# Patient Record
Sex: Female | Born: 1940
Health system: Southern US, Community
[De-identification: ages and names within clinical notes are randomized; demographics above are authoritative.]

## PROBLEM LIST (undated history)

## (undated) DIAGNOSIS — Z9889 Other specified postprocedural states: Secondary | ICD-10-CM

## (undated) DIAGNOSIS — R350 Frequency of micturition: Secondary | ICD-10-CM

## (undated) DIAGNOSIS — Z8709 Personal history of other diseases of the respiratory system: Secondary | ICD-10-CM

## (undated) DIAGNOSIS — M549 Dorsalgia, unspecified: Secondary | ICD-10-CM

## (undated) DIAGNOSIS — R3915 Urgency of urination: Secondary | ICD-10-CM

## (undated) DIAGNOSIS — M1990 Unspecified osteoarthritis, unspecified site: Secondary | ICD-10-CM

## (undated) DIAGNOSIS — R200 Anesthesia of skin: Secondary | ICD-10-CM

## (undated) DIAGNOSIS — K649 Unspecified hemorrhoids: Secondary | ICD-10-CM

## (undated) DIAGNOSIS — G43909 Migraine, unspecified, not intractable, without status migrainosus: Secondary | ICD-10-CM

## (undated) DIAGNOSIS — J449 Chronic obstructive pulmonary disease, unspecified: Secondary | ICD-10-CM

## (undated) DIAGNOSIS — K219 Gastro-esophageal reflux disease without esophagitis: Secondary | ICD-10-CM

## (undated) DIAGNOSIS — G4700 Insomnia, unspecified: Secondary | ICD-10-CM

## (undated) DIAGNOSIS — F329 Major depressive disorder, single episode, unspecified: Secondary | ICD-10-CM

## (undated) DIAGNOSIS — R351 Nocturia: Secondary | ICD-10-CM

## (undated) DIAGNOSIS — R112 Nausea with vomiting, unspecified: Secondary | ICD-10-CM

## (undated) DIAGNOSIS — K5900 Constipation, unspecified: Secondary | ICD-10-CM

## (undated) DIAGNOSIS — M810 Age-related osteoporosis without current pathological fracture: Secondary | ICD-10-CM

## (undated) DIAGNOSIS — K5790 Diverticulosis of intestine, part unspecified, without perforation or abscess without bleeding: Secondary | ICD-10-CM

## (undated) DIAGNOSIS — Z8619 Personal history of other infectious and parasitic diseases: Secondary | ICD-10-CM

## (undated) DIAGNOSIS — Z8669 Personal history of other diseases of the nervous system and sense organs: Secondary | ICD-10-CM

## (undated) DIAGNOSIS — K529 Noninfective gastroenteritis and colitis, unspecified: Secondary | ICD-10-CM

## (undated) DIAGNOSIS — I4891 Unspecified atrial fibrillation: Secondary | ICD-10-CM

## (undated) DIAGNOSIS — H269 Unspecified cataract: Secondary | ICD-10-CM

## (undated) DIAGNOSIS — G8929 Other chronic pain: Secondary | ICD-10-CM

## (undated) DIAGNOSIS — E785 Hyperlipidemia, unspecified: Secondary | ICD-10-CM

## (undated) DIAGNOSIS — F419 Anxiety disorder, unspecified: Secondary | ICD-10-CM

## (undated) DIAGNOSIS — F32A Depression, unspecified: Secondary | ICD-10-CM

## (undated) HISTORY — PX: ABDOMINAL HYSTERECTOMY: SHX81

## (undated) HISTORY — DX: Noninfective gastroenteritis and colitis, unspecified: K52.9

## (undated) HISTORY — DX: Chronic obstructive pulmonary disease, unspecified: J44.9

## (undated) HISTORY — DX: Migraine, unspecified, not intractable, without status migrainosus: G43.909

## (undated) HISTORY — PX: COLONOSCOPY: SHX174

## (undated) HISTORY — DX: Unspecified cataract: H26.9

## (undated) HISTORY — PX: SPINE SURGERY: SHX786

## (undated) HISTORY — DX: Hyperlipidemia, unspecified: E78.5

## (undated) HISTORY — PX: OTHER SURGICAL HISTORY: SHX169

## (undated) HISTORY — DX: Gastro-esophageal reflux disease without esophagitis: K21.9

## (undated) HISTORY — DX: Age-related osteoporosis without current pathological fracture: M81.0

## (undated) HISTORY — PX: ESOPHAGOGASTRODUODENOSCOPY: SHX1529

---
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
## 2000-08-18 ENCOUNTER — Other Ambulatory Visit: Admission: RE | Admit: 2000-08-18 | Discharge: 2000-08-18 | Payer: Self-pay | Admitting: Obstetrics and Gynecology

## 2001-09-07 ENCOUNTER — Encounter (INDEPENDENT_AMBULATORY_CARE_PROVIDER_SITE_OTHER): Payer: Self-pay | Admitting: Specialist

## 2001-09-07 ENCOUNTER — Other Ambulatory Visit: Admission: RE | Admit: 2001-09-07 | Discharge: 2001-09-07 | Payer: Self-pay | Admitting: Gastroenterology

## 2003-06-27 ENCOUNTER — Other Ambulatory Visit: Admission: RE | Admit: 2003-06-27 | Discharge: 2003-06-27 | Payer: Self-pay | Admitting: Family Medicine

## 2005-05-24 ENCOUNTER — Other Ambulatory Visit: Admission: RE | Admit: 2005-05-24 | Discharge: 2005-05-24 | Payer: Self-pay | Admitting: Family Medicine

## 2012-03-26 DIAGNOSIS — M25519 Pain in unspecified shoulder: Secondary | ICD-10-CM | POA: Diagnosis not present

## 2012-04-01 DIAGNOSIS — M25519 Pain in unspecified shoulder: Secondary | ICD-10-CM | POA: Diagnosis not present

## 2012-04-01 DIAGNOSIS — M719 Bursopathy, unspecified: Secondary | ICD-10-CM | POA: Diagnosis not present

## 2012-04-01 DIAGNOSIS — M67919 Unspecified disorder of synovium and tendon, unspecified shoulder: Secondary | ICD-10-CM | POA: Diagnosis not present

## 2012-04-01 DIAGNOSIS — M19019 Primary osteoarthritis, unspecified shoulder: Secondary | ICD-10-CM | POA: Diagnosis not present

## 2012-04-01 DIAGNOSIS — M75100 Unspecified rotator cuff tear or rupture of unspecified shoulder, not specified as traumatic: Secondary | ICD-10-CM | POA: Diagnosis not present

## 2012-04-17 DIAGNOSIS — R7989 Other specified abnormal findings of blood chemistry: Secondary | ICD-10-CM | POA: Diagnosis not present

## 2012-04-17 DIAGNOSIS — R5383 Other fatigue: Secondary | ICD-10-CM | POA: Diagnosis not present

## 2012-04-17 DIAGNOSIS — R5381 Other malaise: Secondary | ICD-10-CM | POA: Diagnosis not present

## 2012-04-17 DIAGNOSIS — E559 Vitamin D deficiency, unspecified: Secondary | ICD-10-CM | POA: Diagnosis not present

## 2012-04-17 DIAGNOSIS — E785 Hyperlipidemia, unspecified: Secondary | ICD-10-CM | POA: Diagnosis not present

## 2012-04-27 DIAGNOSIS — Z1212 Encounter for screening for malignant neoplasm of rectum: Secondary | ICD-10-CM | POA: Diagnosis not present

## 2012-04-29 ENCOUNTER — Encounter: Payer: Self-pay | Admitting: Gastroenterology

## 2012-04-29 DIAGNOSIS — E785 Hyperlipidemia, unspecified: Secondary | ICD-10-CM | POA: Diagnosis not present

## 2012-04-29 DIAGNOSIS — J441 Chronic obstructive pulmonary disease with (acute) exacerbation: Secondary | ICD-10-CM | POA: Diagnosis not present

## 2012-04-30 DIAGNOSIS — M25519 Pain in unspecified shoulder: Secondary | ICD-10-CM | POA: Diagnosis not present

## 2012-05-20 ENCOUNTER — Encounter: Payer: Self-pay | Admitting: Gastroenterology

## 2012-07-14 DIAGNOSIS — Z1231 Encounter for screening mammogram for malignant neoplasm of breast: Secondary | ICD-10-CM | POA: Diagnosis not present

## 2012-07-14 DIAGNOSIS — Z1212 Encounter for screening for malignant neoplasm of rectum: Secondary | ICD-10-CM | POA: Diagnosis not present

## 2012-07-28 DIAGNOSIS — N63 Unspecified lump in unspecified breast: Secondary | ICD-10-CM | POA: Diagnosis not present

## 2012-07-28 DIAGNOSIS — R922 Inconclusive mammogram: Secondary | ICD-10-CM | POA: Diagnosis not present

## 2012-08-12 DIAGNOSIS — E785 Hyperlipidemia, unspecified: Secondary | ICD-10-CM | POA: Diagnosis not present

## 2012-08-12 DIAGNOSIS — J441 Chronic obstructive pulmonary disease with (acute) exacerbation: Secondary | ICD-10-CM | POA: Diagnosis not present

## 2012-08-17 DIAGNOSIS — Z23 Encounter for immunization: Secondary | ICD-10-CM | POA: Diagnosis not present

## 2012-11-13 DIAGNOSIS — E559 Vitamin D deficiency, unspecified: Secondary | ICD-10-CM | POA: Diagnosis not present

## 2012-11-13 DIAGNOSIS — R5381 Other malaise: Secondary | ICD-10-CM | POA: Diagnosis not present

## 2012-11-13 DIAGNOSIS — I10 Essential (primary) hypertension: Secondary | ICD-10-CM | POA: Diagnosis not present

## 2012-11-13 DIAGNOSIS — E785 Hyperlipidemia, unspecified: Secondary | ICD-10-CM | POA: Diagnosis not present

## 2012-12-02 DIAGNOSIS — E039 Hypothyroidism, unspecified: Secondary | ICD-10-CM | POA: Diagnosis not present

## 2012-12-02 DIAGNOSIS — M545 Low back pain, unspecified: Secondary | ICD-10-CM | POA: Diagnosis not present

## 2012-12-02 DIAGNOSIS — M4000 Postural kyphosis, site unspecified: Secondary | ICD-10-CM | POA: Diagnosis not present

## 2012-12-02 DIAGNOSIS — R634 Abnormal weight loss: Secondary | ICD-10-CM | POA: Diagnosis not present

## 2012-12-02 DIAGNOSIS — J441 Chronic obstructive pulmonary disease with (acute) exacerbation: Secondary | ICD-10-CM | POA: Diagnosis not present

## 2012-12-02 DIAGNOSIS — H6120 Impacted cerumen, unspecified ear: Secondary | ICD-10-CM | POA: Diagnosis not present

## 2013-02-01 ENCOUNTER — Other Ambulatory Visit: Payer: Self-pay | Admitting: *Deleted

## 2013-02-01 DIAGNOSIS — M899 Disorder of bone, unspecified: Secondary | ICD-10-CM

## 2013-02-01 DIAGNOSIS — M949 Disorder of cartilage, unspecified: Secondary | ICD-10-CM

## 2013-02-15 ENCOUNTER — Other Ambulatory Visit: Payer: Self-pay

## 2013-02-15 DIAGNOSIS — Z1212 Encounter for screening for malignant neoplasm of rectum: Secondary | ICD-10-CM

## 2013-03-25 ENCOUNTER — Ambulatory Visit (INDEPENDENT_AMBULATORY_CARE_PROVIDER_SITE_OTHER): Payer: Medicare Other

## 2013-03-25 ENCOUNTER — Encounter: Payer: Self-pay | Admitting: Family Medicine

## 2013-03-25 ENCOUNTER — Ambulatory Visit (INDEPENDENT_AMBULATORY_CARE_PROVIDER_SITE_OTHER): Payer: Medicare Other | Admitting: Family Medicine

## 2013-03-25 VITALS — BP 119/65 | HR 82 | Temp 97.9°F | Ht 67.0 in | Wt 129.4 lb

## 2013-03-25 DIAGNOSIS — M545 Low back pain: Secondary | ICD-10-CM

## 2013-03-25 DIAGNOSIS — M5137 Other intervertebral disc degeneration, lumbosacral region: Secondary | ICD-10-CM | POA: Diagnosis not present

## 2013-03-25 DIAGNOSIS — M5136 Other intervertebral disc degeneration, lumbar region: Secondary | ICD-10-CM

## 2013-03-25 MED ORDER — MELOXICAM 15 MG PO TABS: ORAL_TABLET | ORAL | Status: DC

## 2013-03-25 NOTE — Progress Notes (Signed)
  Subjective:    Patient ID: Misty [name], female    DOB: 1941/11/15, 72 y.o.   MRN: [id number]  HPI Patient has had back pain off and on for 2 months especially with increased activity. No history of any falls or injury at the onset of this back pain. She has pain every day. Pain goes to both buttocks and down both latex. The only relief comes from being still and being supine.   Review of Systems  Musculoskeletal: Positive for back pain (LBP with radiculopathy).       Objective:   Physical Exam Heart has a regular rate and rhythm. Lungs are clear anteriorly and posteriorly. Abdomen is soft and nontender. Reflexes are 2+ and equal bilaterally. There is no edema in the legs. Leg raising is limited to 45 bilaterally. Hip abduction is limited due to increased pain in the back. This is bilateral. Patient is alert and complains of pain in the back and down her legs as I examined her.   [facility] reading (PRIMARY) by  Dr. Christell Constant: Degenerative disc disease and arthritis                                      Assessment & Plan:  1. Low back pain radiating to both legs - DG Lumbar Spine 2-3 Views - meloxicam (MOBIC) 15 MG tablet; 1/2-1 tablet daily after eating as directed  Dispense: 30 tablet; Refill: 0  2. Degenerative disc disease, lumbar - meloxicam (MOBIC) 15 MG tablet; 1/2-1 tablet daily after eating as directed  Dispense: 30 tablet; Refill: 0  Patient Instructions  Take medications as directed Mobic, one daily for 5-7 days after eating then one half daily Use warm compresses to back 20 minutes 4 times daily If problems continue he will need to have an MRI of her LS spine If no better in one week call back and we will arrange for this special x-ray

## 2013-03-25 NOTE — Patient Instructions (Signed)
Take medications as directed Mobic, one daily for 5-7 days after eating then one half daily Use warm compresses to back 20 minutes 4 times daily If problems continue he will need to have an MRI of her LS spine If no better in one week call back and we will arrange for this special x-ray

## 2013-04-07 ENCOUNTER — Ambulatory Visit (INDEPENDENT_AMBULATORY_CARE_PROVIDER_SITE_OTHER): Payer: Medicare Other | Admitting: Pharmacist

## 2013-04-07 ENCOUNTER — Ambulatory Visit (INDEPENDENT_AMBULATORY_CARE_PROVIDER_SITE_OTHER): Payer: Medicare Other

## 2013-04-07 VITALS — Ht 67.0 in | Wt 129.5 lb

## 2013-04-07 DIAGNOSIS — K219 Gastro-esophageal reflux disease without esophagitis: Secondary | ICD-10-CM

## 2013-04-07 DIAGNOSIS — M8580 Other specified disorders of bone density and structure, unspecified site: Secondary | ICD-10-CM

## 2013-04-07 DIAGNOSIS — M949 Disorder of cartilage, unspecified: Secondary | ICD-10-CM

## 2013-04-07 DIAGNOSIS — M810 Age-related osteoporosis without current pathological fracture: Secondary | ICD-10-CM | POA: Diagnosis not present

## 2013-04-07 DIAGNOSIS — M899 Disorder of bone, unspecified: Secondary | ICD-10-CM

## 2013-04-07 NOTE — Progress Notes (Signed)
Patient ID: Misty [name], female   DOB: Apr 03, 1941, 72 y.o.   MRN: [id number] [facility] Current Height: Height: 5\' 7"  (170.2 cm)      Max Lifetime Height:  5\' 7"   Current Weight: Weight: 129 lb 8 oz (58.741 kg)       Ethnicity:Caucasian    HPI: Does pt already have a diagnosis of:  Osteopenia?  Yes Osteoporosis?  No  Back Pain?  Yes       Kyphosis?  Yes Prior fracture?  No Med(s) for Osteoporosis/Osteopenia:  none Med(s) previously tried for Osteoporosis/Osteopenia:  Fosamax - too expensive and was concerned about side effects                                                             PMH: Age at menopause:  Surgical in 30's Hysterectomy?  Yes Oophorectomy?  No HRT? Yes - Former.  Type/duration: estrogen patches Steroid Use?  No Thyroid med?  No History of cancer?  No History of digestive disorders (ie Crohn's)?  Yes - GERD Current or previous eating disorders?  No Last Vitamin D Result:  Unable to determine (paitent's chart unavailable and no current results in EMR - pt due labs at tomorrow's visit) Last GFR Result:  Unable to determine (patient's chart unavailabe and no current results in EMR - pt due labs at tomorrow's visit)  FH/SH: Family history of osteoporosis?  Yes -  mother Parent with history of hip fracture?  Yes - mother Family history of breast cancer?  No Exercise?  No 3 cans daily of soda  Smoking?  No Alcohol?  No    Calcium Assessment Calcium Intake  # of servings/day  Calcium mg  Milk (8 oz) 1  x  300  = 300mg   Yogurt (4 oz) 0.5 x  200 = 100mg   Cheese (1 oz) 0 x  200 = 0  Other Calcium sources   250mg   Ca supplement Centrum MVI = 400mg    Estimated calcium intake per day 1050mg     DEXA Results Date of Test T-Score for AP Spine L1-L4 T-Score for Total Left Hip T-Score for Total Right Hip  04/07/2013 -1.4 -2.2 -2.5  10/31/2000 -0.9 -1.7 -2.0  09/21/2008 -0.8 -1.6 -1.9  06/23/2006 -0.7 -1.7 -1.9    Assessment: Osteoporosis - BMD  has decreased since last DEXA  Recommendations: 1.  Discussed pros and cons of multiple medications for osteoporosis - alendronate/bisphonates and Prolia specifically.  Patient still have concerns about ONJ with Fosamax because she thinks her mother's dental problems were due to her taking alendronate.  Patient also is concerned about the cost of Prolia.  I also discussed the possibility of Evista.   Patient is scheduled to see Dr. Christell Constant tomorrow and wants to discuss options with him.  2.  recommend calcium 1200mg  daily either through supplementation   or diet.   3.  recommend weight bearing exercise - 30 minutes at least 4 days   per week (once current acute back pain resolved)   4.  Counseled and educated about fall risk and prevention.  Recheck DEXA:  2 years  Time spent counseling patient:  30 minutes

## 2013-04-07 NOTE — Patient Instructions (Addendum)

## 2013-04-08 ENCOUNTER — Ambulatory Visit (INDEPENDENT_AMBULATORY_CARE_PROVIDER_SITE_OTHER): Payer: Medicare Other | Admitting: Family Medicine

## 2013-04-08 ENCOUNTER — Encounter: Payer: Self-pay | Admitting: Family Medicine

## 2013-04-08 ENCOUNTER — Ambulatory Visit (INDEPENDENT_AMBULATORY_CARE_PROVIDER_SITE_OTHER): Payer: Medicare Other

## 2013-04-08 VITALS — BP 128/71 | HR 63 | Temp 97.3°F | Ht 67.0 in | Wt 131.0 lb

## 2013-04-08 DIAGNOSIS — M545 Low back pain, unspecified: Secondary | ICD-10-CM

## 2013-04-08 DIAGNOSIS — E559 Vitamin D deficiency, unspecified: Secondary | ICD-10-CM | POA: Diagnosis not present

## 2013-04-08 DIAGNOSIS — M25559 Pain in unspecified hip: Secondary | ICD-10-CM | POA: Diagnosis not present

## 2013-04-08 DIAGNOSIS — M25552 Pain in left hip: Secondary | ICD-10-CM

## 2013-04-08 DIAGNOSIS — J441 Chronic obstructive pulmonary disease with (acute) exacerbation: Secondary | ICD-10-CM | POA: Diagnosis not present

## 2013-04-08 DIAGNOSIS — M810 Age-related osteoporosis without current pathological fracture: Secondary | ICD-10-CM | POA: Diagnosis not present

## 2013-04-08 NOTE — Progress Notes (Signed)
  Subjective:    Patient ID: Misty [name], female    DOB: [birth year], 72 y.o.   MRN: [id number]  HPI  Patient comes in today to review her problems. Her biggest problem is her low back pain her left hip pain radiating down her left leg. Review of previous x-rays showed spur formation and degenerative disc disease especially at L4,5 and S1. She also has developed osteoporosis in her right hip. We had a discussion revolving around what kind of treatment would be necessary and we can look into using prolia .   Review of Systems  Constitutional: Positive for fatigue.  HENT: Positive for sinus pressure (allergies).   Eyes: Negative.   Respiratory: Negative.   Cardiovascular: Negative.   Gastrointestinal: Negative.   Endocrine: Negative.   Genitourinary: Negative.   Musculoskeletal: Positive for back pain.  Allergic/Immunologic: Negative.   Neurological: Negative.   Hematological: Negative.   Psychiatric/Behavioral: Negative.        Objective:   Physical Exam BP 128/71  Pulse 63  Temp(Src) 97.3 F (36.3 C) (Oral)  Ht 5' 7"  (1.702 m)  Wt 131 lb (59.421 kg)  BMI 20.51 kg/m2  The patient appeared well , but somewhat thin. She was alert and oriented to time and place. Speech, behavior and judgement appear normal. Vital signs as documented.  [name] exam is unremarkable. No scleral icterus or pallor noted. Slight nasal congestion bilaterally. Throat was normal. TMs were normal. Neck is without jugular venous distension, thyromegally, or carotid bruits. Carotid upstrokes are brisk bilaterally. No cervical adenopathy. Lungs are clear anteriorly and posteriorly to auscultation. Normal respiratory effort. Cardiac exam reveals a slightly irregular rate and rhythm at 60 per minute. First and second heart sounds normal. No murmurs, rubs or gallops.  Abdominal exam reveals normal bowl sounds, no masses, no organomegaly and no aortic enlargement. No inguinal adenopathy. Minimal epigastric  tenderness Extremities are nonedematous and both femoral and pedal pulses are normal. Skin without pallor or jaundice.  Warm and dry, without rash. Neurologic exam reveals normal deep tendon reflexes and normal sensation.  [facility] reading (PRIMARY) by  Dr. Laurance Flatten; left hip   degenerative changes are present                                      Assessment & Plan:  1. Unspecified vitamin D deficiency - COMPLETE METABOLIC PANEL WITH GFR; Standing - NMR Lipoprofile with Lipids; Standing  2. COPD exacerbation - COMPLETE METABOLIC PANEL WITH GFR; Standing - NMR Lipoprofile with Lipids; Standing  3. Osteoporosis, unspecified - COMPLETE METABOLIC PANEL WITH GFR; Standing - NMR Lipoprofile with Lipids; Standing -Consider prolia  4. Left hip pain - DG Hip Complete Left; Future  5. Low back pain - DG Hip Complete Left; Future -May need MRI of the lumbar spine and referral to orthopedic  Patient Instructions  Fall precautions discussed Continue currents medications and therapeutic lifestyle changes. Will discuss further treatment for osteoporosis Will call with results regarding x-rays and further evaluation of back

## 2013-04-08 NOTE — Patient Instructions (Addendum)
Fall precautions discussed Continue currents medications and therapeutic lifestyle changes. Will discuss further treatment for osteoporosis Will call with results regarding x-rays and further evaluation of back

## 2013-04-13 ENCOUNTER — Other Ambulatory Visit: Payer: Self-pay | Admitting: *Deleted

## 2013-04-13 DIAGNOSIS — M549 Dorsalgia, unspecified: Secondary | ICD-10-CM

## 2013-04-15 ENCOUNTER — Telehealth: Payer: Self-pay | Admitting: *Deleted

## 2013-04-15 ENCOUNTER — Ambulatory Visit (HOSPITAL_COMMUNITY)
Admission: RE | Admit: 2013-04-15 | Discharge: 2013-04-15 | Disposition: A | Discharge status: Home / Self Care | Payer: Medicare Other | Source: Ambulatory Visit | Attending: Family Medicine | Admitting: Family Medicine

## 2013-04-15 DIAGNOSIS — R209 Unspecified disturbances of skin sensation: Secondary | ICD-10-CM | POA: Insufficient documentation

## 2013-04-15 DIAGNOSIS — M549 Dorsalgia, unspecified: Secondary | ICD-10-CM

## 2013-04-15 DIAGNOSIS — M48061 Spinal stenosis, lumbar region without neurogenic claudication: Secondary | ICD-10-CM | POA: Diagnosis not present

## 2013-04-15 DIAGNOSIS — M545 Low back pain, unspecified: Secondary | ICD-10-CM | POA: Diagnosis not present

## 2013-04-15 DIAGNOSIS — M5126 Other intervertebral disc displacement, lumbar region: Secondary | ICD-10-CM | POA: Insufficient documentation

## 2013-04-15 DIAGNOSIS — M5137 Other intervertebral disc degeneration, lumbosacral region: Secondary | ICD-10-CM | POA: Diagnosis not present

## 2013-04-15 NOTE — Telephone Encounter (Signed)
Pt notified of results

## 2013-04-15 NOTE — Telephone Encounter (Signed)
Message copied by Bearl Mulberry on Thu Apr 15, 2013  5:34 PM ------      Message from: Ernestina Penna      Created: Thu Apr 15, 2013  3:42 PM       The patient needs to come in to discuss the results of this x-ray report      She'll need to be seen by a neurosurgeon to further evaluate these problems      She has a lot of protruding disc affecting the nerves coming out of her spinal cord as well as spinal stenosis      There is no medicine that can fix this      Go on and get an appointment with a neurosurgeon, and I will be glad to discuss these findings with her in person      You may take her a copy of the report home ------

## 2013-05-04 ENCOUNTER — Other Ambulatory Visit: Payer: Self-pay | Admitting: Neurosurgery

## 2013-05-04 DIAGNOSIS — M545 Low back pain, unspecified: Secondary | ICD-10-CM | POA: Diagnosis not present

## 2013-05-04 DIAGNOSIS — M5126 Other intervertebral disc displacement, lumbar region: Secondary | ICD-10-CM | POA: Diagnosis not present

## 2013-05-04 DIAGNOSIS — M48062 Spinal stenosis, lumbar region with neurogenic claudication: Secondary | ICD-10-CM | POA: Diagnosis not present

## 2013-05-04 DIAGNOSIS — M47817 Spondylosis without myelopathy or radiculopathy, lumbosacral region: Secondary | ICD-10-CM | POA: Diagnosis not present

## 2013-05-10 ENCOUNTER — Encounter (HOSPITAL_COMMUNITY): Payer: Self-pay | Admitting: Respiratory Therapy

## 2013-05-12 NOTE — Pre-Procedure Instructions (Signed)
ROGENIA WERNTZ  05/12/2013   Your procedure is scheduled on:  Thurs, July 3 @ 7:30 AM  Report to [facility] at 5:30 AM.  Call this number if you have problems the morning of surgery: [phone]   Remember:   Do not eat food or drink liquids after midnight.   Take these medicines the morning of surgery with A SIP OF WATER: Alprazolam(Xanax),Tramadol(Ultram),and Zantac(Ranitidine)              No Aspirin,Goody's,BC's,Aleve,Ibuprofen,Fish Oil,or any Herbal Medications   Do not wear jewelry, make-up or nail polish.  Do not wear lotions, powders, or perfumes. You may wear deodorant.  Do not shave 48 hours prior to surgery.   Do not bring valuables to the hospital.  [facility] is not responsible                   for any belongings or valuables.  Contacts, dentures or bridgework may not be worn into surgery.  Leave suitcase in the car. After surgery it may be brought to your room.  For patients admitted to the hospital, checkout time is 11:00 AM the day of  discharge.   Patients discharged the day of surgery will not be allowed to drive  home.    Special Instructions: Shower using CHG 2 nights before surgery and the night before surgery.  If you shower the day of surgery use CHG.  Use special wash - you have one bottle of CHG for all showers.  You should use approximately 1/3 of the bottle for each shower.   Please read over the following fact sheets that you were given: Pain Booklet, Coughing and Deep Breathing, MRSA Information and Surgical Site Infection Prevention

## 2013-05-13 ENCOUNTER — Encounter (HOSPITAL_COMMUNITY): Payer: Self-pay

## 2013-05-13 ENCOUNTER — Encounter (HOSPITAL_COMMUNITY)
Admission: RE | Admit: 2013-05-13 | Discharge: 2013-05-13 | Disposition: A | Discharge status: Home / Self Care | Payer: Medicare Other | Source: Ambulatory Visit | Attending: Neurosurgery | Admitting: Neurosurgery

## 2013-05-13 DIAGNOSIS — J449 Chronic obstructive pulmonary disease, unspecified: Secondary | ICD-10-CM | POA: Diagnosis not present

## 2013-05-13 DIAGNOSIS — M47817 Spondylosis without myelopathy or radiculopathy, lumbosacral region: Secondary | ICD-10-CM | POA: Diagnosis not present

## 2013-05-13 DIAGNOSIS — Z79899 Other long term (current) drug therapy: Secondary | ICD-10-CM | POA: Diagnosis not present

## 2013-05-13 DIAGNOSIS — F17200 Nicotine dependence, unspecified, uncomplicated: Secondary | ICD-10-CM | POA: Diagnosis not present

## 2013-05-13 DIAGNOSIS — M5126 Other intervertebral disc displacement, lumbar region: Secondary | ICD-10-CM | POA: Diagnosis not present

## 2013-05-13 DIAGNOSIS — R112 Nausea with vomiting, unspecified: Secondary | ICD-10-CM | POA: Diagnosis not present

## 2013-05-13 DIAGNOSIS — Z01812 Encounter for preprocedural laboratory examination: Secondary | ICD-10-CM | POA: Diagnosis not present

## 2013-05-13 HISTORY — DX: Personal history of other diseases of the respiratory system: Z87.09

## 2013-05-13 HISTORY — DX: Nausea with vomiting, unspecified: R11.2

## 2013-05-13 HISTORY — DX: Anxiety disorder, unspecified: F41.9

## 2013-05-13 HISTORY — DX: Unspecified osteoarthritis, unspecified site: M19.90

## 2013-05-13 HISTORY — DX: Unspecified hemorrhoids: K64.9

## 2013-05-13 HISTORY — DX: Depression, unspecified: F32.A

## 2013-05-13 HISTORY — DX: Nocturia: R35.1

## 2013-05-13 HISTORY — DX: Major depressive disorder, single episode, unspecified: F32.9

## 2013-05-13 HISTORY — DX: Insomnia, unspecified: G47.00

## 2013-05-13 HISTORY — DX: Dorsalgia, unspecified: M54.9

## 2013-05-13 HISTORY — DX: Urgency of urination: R39.15

## 2013-05-13 HISTORY — DX: Personal history of other infectious and parasitic diseases: Z86.19

## 2013-05-13 HISTORY — DX: Other chronic pain: G89.29

## 2013-05-13 HISTORY — DX: Constipation, unspecified: K59.00

## 2013-05-13 HISTORY — DX: Personal history of other diseases of the nervous system and sense organs: Z86.69

## 2013-05-13 HISTORY — DX: Anesthesia of skin: R20.0

## 2013-05-13 HISTORY — DX: Diverticulosis of intestine, part unspecified, without perforation or abscess without bleeding: K57.90

## 2013-05-13 HISTORY — DX: Other specified postprocedural states: Z98.890

## 2013-05-13 HISTORY — DX: Frequency of micturition: R35.0

## 2013-05-13 LAB — CBC
HCT: 41.5 % (ref 36.0–46.0)
MCH: 30.3 pg (ref 26.0–34.0)
MCHC: 32.8 g/dL (ref 30.0–36.0)
MCV: 92.4 fL (ref 78.0–100.0)
Platelets: 179 10*3/uL (ref 150–400)
RDW: 14.5 % (ref 11.5–15.5)
WBC: 6.9 10*3/uL (ref 4.0–10.5)

## 2013-05-13 LAB — SURGICAL PCR SCREEN
MRSA, PCR: NEGATIVE
Staphylococcus aureus: NEGATIVE

## 2013-05-13 LAB — BASIC METABOLIC PANEL
BUN: 8 mg/dL (ref 6–23)
CO2: 30 mEq/L (ref 19–32)
Calcium: 8.9 mg/dL (ref 8.4–10.5)
Chloride: 106 mEq/L (ref 96–112)
Creatinine, Ser: 0.75 mg/dL (ref 0.50–1.10)
Glucose, Bld: 99 mg/dL (ref 70–99)

## 2013-05-13 NOTE — Progress Notes (Signed)
Pt doesn't have a cardiologist  Denies ever having a stress test/echo/heart cath  Medical Md is Dr.Donald Christell Constant with [facility]  Denies EKG or CXR in past yr

## 2013-05-19 MED ORDER — CEFAZOLIN SODIUM-DEXTROSE 2-3 GM-% IV SOLR
2.0000 g | INTRAVENOUS | Status: AC
  Administered 2013-05-20: 2 g via INTRAVENOUS
  Filled 2013-05-19: qty 50

## 2013-05-20 ENCOUNTER — Encounter (HOSPITAL_COMMUNITY): Payer: Self-pay | Admitting: Anesthesiology

## 2013-05-20 ENCOUNTER — Encounter (HOSPITAL_COMMUNITY): Payer: Self-pay | Admitting: *Deleted

## 2013-05-20 ENCOUNTER — Inpatient Hospital Stay (HOSPITAL_COMMUNITY): Payer: Medicare Other

## 2013-05-20 ENCOUNTER — Inpatient Hospital Stay (HOSPITAL_COMMUNITY): Payer: Medicare Other | Admitting: Anesthesiology

## 2013-05-20 ENCOUNTER — Observation Stay (HOSPITAL_COMMUNITY)
Admission: RE | Admit: 2013-05-20 | Discharge: 2013-05-21 | Disposition: A | Discharge status: Home / Self Care | Payer: Medicare Other | Source: Ambulatory Visit | Attending: Neurosurgery | Admitting: Neurosurgery

## 2013-05-20 ENCOUNTER — Encounter (HOSPITAL_COMMUNITY)
Admission: RE | Disposition: A | Discharge status: Home / Self Care | Payer: Self-pay | Source: Ambulatory Visit | Attending: Neurosurgery

## 2013-05-20 DIAGNOSIS — R112 Nausea with vomiting, unspecified: Secondary | ICD-10-CM | POA: Insufficient documentation

## 2013-05-20 DIAGNOSIS — Z01812 Encounter for preprocedural laboratory examination: Secondary | ICD-10-CM | POA: Diagnosis not present

## 2013-05-20 DIAGNOSIS — M5126 Other intervertebral disc displacement, lumbar region: Secondary | ICD-10-CM | POA: Diagnosis not present

## 2013-05-20 DIAGNOSIS — M48061 Spinal stenosis, lumbar region without neurogenic claudication: Secondary | ICD-10-CM | POA: Diagnosis not present

## 2013-05-20 DIAGNOSIS — F17200 Nicotine dependence, unspecified, uncomplicated: Secondary | ICD-10-CM | POA: Diagnosis not present

## 2013-05-20 DIAGNOSIS — Z79899 Other long term (current) drug therapy: Secondary | ICD-10-CM | POA: Insufficient documentation

## 2013-05-20 DIAGNOSIS — J449 Chronic obstructive pulmonary disease, unspecified: Secondary | ICD-10-CM | POA: Diagnosis not present

## 2013-05-20 DIAGNOSIS — M519 Unspecified thoracic, thoracolumbar and lumbosacral intervertebral disc disorder: Secondary | ICD-10-CM | POA: Diagnosis not present

## 2013-05-20 DIAGNOSIS — M47817 Spondylosis without myelopathy or radiculopathy, lumbosacral region: Secondary | ICD-10-CM | POA: Insufficient documentation

## 2013-05-20 DIAGNOSIS — J4489 Other specified chronic obstructive pulmonary disease: Secondary | ICD-10-CM | POA: Insufficient documentation

## 2013-05-20 HISTORY — PX: LUMBAR LAMINECTOMY/DECOMPRESSION MICRODISCECTOMY: SHX5026

## 2013-05-20 SURGERY — LUMBAR LAMINECTOMY/DECOMPRESSION MICRODISCECTOMY 1 LEVEL
Anesthesia: General | Site: Back | Laterality: Bilateral | Wound class: Clean

## 2013-05-20 MED ORDER — PROPOFOL 10 MG/ML IV BOLUS
INTRAVENOUS | Status: DC | PRN
  Administered 2013-05-20: 100 mg via INTRAVENOUS

## 2013-05-20 MED ORDER — KETOROLAC TROMETHAMINE 30 MG/ML IJ SOLN
15.0000 mg | Freq: Once | INTRAMUSCULAR | Status: AC
  Administered 2013-05-20: 15 mg via INTRAVENOUS

## 2013-05-20 MED ORDER — ACETAMINOPHEN 650 MG RE SUPP: 650.0000 mg | RECTAL | Status: DC | PRN

## 2013-05-20 MED ORDER — KETOROLAC TROMETHAMINE 30 MG/ML IJ SOLN
15.0000 mg | Freq: Four times a day (QID) | INTRAMUSCULAR | Status: DC
  Administered 2013-05-20 – 2013-05-21 (×3): 15 mg via INTRAVENOUS
  Filled 2013-05-20 (×8): qty 1

## 2013-05-20 MED ORDER — ACETAMINOPHEN 10 MG/ML IV SOLN
1000.0000 mg | Freq: Four times a day (QID) | INTRAVENOUS | Status: DC
  Administered 2013-05-20 – 2013-05-21 (×3): 1000 mg via INTRAVENOUS
  Filled 2013-05-20 (×4): qty 100

## 2013-05-20 MED ORDER — OXYCODONE HCL 5 MG PO TABS
ORAL_TABLET | ORAL | Status: AC
  Filled 2013-05-20: qty 1

## 2013-05-20 MED ORDER — EPHEDRINE SULFATE 50 MG/ML IJ SOLN
INTRAMUSCULAR | Status: DC | PRN
  Administered 2013-05-20: 10 mg via INTRAVENOUS

## 2013-05-20 MED ORDER — ONDANSETRON 8 MG/NS 50 ML IVPB
8.0000 mg | Freq: Once | INTRAVENOUS | Status: AC
  Administered 2013-05-20: 8 mg via INTRAVENOUS
  Filled 2013-05-20: qty 8

## 2013-05-20 MED ORDER — BACITRACIN 50000 UNITS IM SOLR
INTRAMUSCULAR | Status: AC
  Filled 2013-05-20: qty 1

## 2013-05-20 MED ORDER — SODIUM CHLORIDE 0.9 % IJ SOLN: 3.0000 mL | INTRAMUSCULAR | Status: DC | PRN

## 2013-05-20 MED ORDER — KETOROLAC TROMETHAMINE 30 MG/ML IJ SOLN
INTRAMUSCULAR | Status: AC
  Filled 2013-05-20: qty 1

## 2013-05-20 MED ORDER — HYDROMORPHONE HCL PF 1 MG/ML IJ SOLN
0.2500 mg | INTRAMUSCULAR | Status: DC | PRN
  Administered 2013-05-20 (×2): 0.5 mg via INTRAVENOUS

## 2013-05-20 MED ORDER — SODIUM CHLORIDE 0.9 % IV SOLN
INTRAVENOUS | Status: AC
  Filled 2013-05-20: qty 500

## 2013-05-20 MED ORDER — CYCLOBENZAPRINE HCL 10 MG PO TABS: 10.0000 mg | ORAL_TABLET | Freq: Three times a day (TID) | ORAL | Status: DC | PRN

## 2013-05-20 MED ORDER — MORPHINE SULFATE 4 MG/ML IJ SOLN: 4.0000 mg | INTRAMUSCULAR | Status: DC | PRN

## 2013-05-20 MED ORDER — ARTIFICIAL TEARS OP OINT
TOPICAL_OINTMENT | OPHTHALMIC | Status: DC | PRN
  Administered 2013-05-20: 1 via OPHTHALMIC

## 2013-05-20 MED ORDER — MIDAZOLAM HCL 2 MG/2ML IJ SOLN: 0.5000 mg | Freq: Once | INTRAMUSCULAR | Status: DC | PRN

## 2013-05-20 MED ORDER — MENTHOL 3 MG MT LOZG: 1.0000 | LOZENGE | OROMUCOSAL | Status: DC | PRN

## 2013-05-20 MED ORDER — MAGNESIUM HYDROXIDE 400 MG/5ML PO SUSP: 30.0000 mL | Freq: Every day | ORAL | Status: DC | PRN

## 2013-05-20 MED ORDER — PROMETHAZINE HCL 25 MG/ML IJ SOLN: 6.2500 mg | INTRAMUSCULAR | Status: DC | PRN

## 2013-05-20 MED ORDER — BISACODYL 10 MG RE SUPP: 10.0000 mg | Freq: Every day | RECTAL | Status: DC | PRN

## 2013-05-20 MED ORDER — ZOLPIDEM TARTRATE 5 MG PO TABS: 5.0000 mg | ORAL_TABLET | Freq: Every evening | ORAL | Status: DC | PRN

## 2013-05-20 MED ORDER — FENTANYL CITRATE 0.05 MG/ML IJ SOLN
INTRAMUSCULAR | Status: AC
  Filled 2013-05-20: qty 2

## 2013-05-20 MED ORDER — BUPIVACAINE HCL (PF) 0.5 % IJ SOLN
INTRAMUSCULAR | Status: DC | PRN
  Administered 2013-05-20: 7.5 mL

## 2013-05-20 MED ORDER — OXYCODONE HCL 5 MG PO TABS
5.0000 mg | ORAL_TABLET | Freq: Once | ORAL | Status: AC | PRN
  Administered 2013-05-20: 5 mg via ORAL

## 2013-05-20 MED ORDER — 0.9 % SODIUM CHLORIDE (POUR BTL) OPTIME
TOPICAL | Status: DC | PRN
  Administered 2013-05-20: 1000 mL

## 2013-05-20 MED ORDER — ONDANSETRON HCL 4 MG/2ML IJ SOLN
4.0000 mg | Freq: Four times a day (QID) | INTRAMUSCULAR | Status: DC | PRN
  Filled 2013-05-20: qty 4

## 2013-05-20 MED ORDER — KCL IN DEXTROSE-NACL 20-5-0.45 MEQ/L-%-% IV SOLN
INTRAVENOUS | Status: DC
  Administered 2013-05-20: 19:00:00 via INTRAVENOUS
  Filled 2013-05-20 (×5): qty 1000

## 2013-05-20 MED ORDER — HYDROXYZINE HCL 25 MG PO TABS: 50.0000 mg | ORAL_TABLET | ORAL | Status: DC | PRN

## 2013-05-20 MED ORDER — ROCURONIUM BROMIDE 100 MG/10ML IV SOLN
INTRAVENOUS | Status: DC | PRN
  Administered 2013-05-20: 50 mg via INTRAVENOUS

## 2013-05-20 MED ORDER — SODIUM CHLORIDE 0.9 % IJ SOLN: 3.0000 mL | Freq: Two times a day (BID) | INTRAMUSCULAR | Status: DC

## 2013-05-20 MED ORDER — THROMBIN 5000 UNITS EX SOLR
CUTANEOUS | Status: DC | PRN
  Administered 2013-05-20 (×2): 5000 [IU] via TOPICAL

## 2013-05-20 MED ORDER — ONDANSETRON HCL 4 MG/2ML IJ SOLN
INTRAMUSCULAR | Status: DC | PRN
  Administered 2013-05-20: 4 mg via INTRAVENOUS

## 2013-05-20 MED ORDER — SODIUM CHLORIDE 0.9 % IR SOLN
Status: DC | PRN
  Administered 2013-05-20: 09:00:00

## 2013-05-20 MED ORDER — OXYCODONE HCL 5 MG PO TABS
5.0000 mg | ORAL_TABLET | ORAL | Status: DC | PRN
  Administered 2013-05-20 – 2013-05-21 (×2): 10 mg via ORAL
  Filled 2013-05-20 (×2): qty 2

## 2013-05-20 MED ORDER — GLYCOPYRROLATE 0.2 MG/ML IJ SOLN
INTRAMUSCULAR | Status: DC | PRN
  Administered 2013-05-20: 0.4 mg via INTRAVENOUS

## 2013-05-20 MED ORDER — NEOSTIGMINE METHYLSULFATE 1 MG/ML IJ SOLN
INTRAMUSCULAR | Status: DC | PRN
  Administered 2013-05-20: 3 mg via INTRAVENOUS

## 2013-05-20 MED ORDER — LIDOCAINE HCL 4 % MT SOLN
OROMUCOSAL | Status: DC | PRN
  Administered 2013-05-20: 4 mL via TOPICAL

## 2013-05-20 MED ORDER — ALPRAZOLAM 0.5 MG PO TABS: 0.5000 mg | ORAL_TABLET | Freq: Three times a day (TID) | ORAL | Status: DC | PRN

## 2013-05-20 MED ORDER — OXYCODONE HCL 5 MG/5ML PO SOLN: 5.0000 mg | Freq: Once | ORAL | Status: AC | PRN

## 2013-05-20 MED ORDER — PROMETHAZINE HCL 25 MG/ML IJ SOLN
12.5000 mg | Freq: Four times a day (QID) | INTRAMUSCULAR | Status: DC | PRN
  Administered 2013-05-20: 12.5 mg via INTRAVENOUS
  Filled 2013-05-20: qty 1

## 2013-05-20 MED ORDER — HEMOSTATIC AGENTS (NO CHARGE) OPTIME
TOPICAL | Status: DC | PRN
  Administered 2013-05-20: 1 via TOPICAL

## 2013-05-20 MED ORDER — METHYLPREDNISOLONE ACETATE 80 MG/ML IJ SUSP
INTRAMUSCULAR | Status: DC | PRN
  Administered 2013-05-20: 80 mg via INTRA_ARTICULAR

## 2013-05-20 MED ORDER — LIDOCAINE-EPINEPHRINE 1 %-1:100000 IJ SOLN
INTRAMUSCULAR | Status: DC | PRN
  Administered 2013-05-20: 7.5 mL

## 2013-05-20 MED ORDER — ACETAMINOPHEN 325 MG PO TABS: 650.0000 mg | ORAL_TABLET | ORAL | Status: DC | PRN

## 2013-05-20 MED ORDER — LIDOCAINE HCL (CARDIAC) 20 MG/ML IV SOLN
INTRAVENOUS | Status: DC | PRN
  Administered 2013-05-20: 20 mg via INTRAVENOUS

## 2013-05-20 MED ORDER — MEPERIDINE HCL 25 MG/ML IJ SOLN: 6.2500 mg | INTRAMUSCULAR | Status: DC | PRN

## 2013-05-20 MED ORDER — ALUM & MAG HYDROXIDE-SIMETH 200-200-20 MG/5ML PO SUSP
30.0000 mL | Freq: Four times a day (QID) | ORAL | Status: DC | PRN
  Administered 2013-05-20: 30 mL via ORAL
  Filled 2013-05-20: qty 30

## 2013-05-20 MED ORDER — FENTANYL CITRATE 0.05 MG/ML IJ SOLN
INTRAMUSCULAR | Status: DC | PRN
  Administered 2013-05-20: 250 ug via INTRAVENOUS

## 2013-05-20 MED ORDER — HYDROMORPHONE HCL PF 1 MG/ML IJ SOLN
INTRAMUSCULAR | Status: AC
  Filled 2013-05-20: qty 1

## 2013-05-20 MED ORDER — FENTANYL CITRATE 0.05 MG/ML IJ SOLN
INTRAMUSCULAR | Status: DC | PRN
  Administered 2013-05-20: 100 ug via INTRAVENOUS

## 2013-05-20 MED ORDER — PHENYLEPHRINE HCL 10 MG/ML IJ SOLN
INTRAMUSCULAR | Status: DC | PRN
  Administered 2013-05-20 (×2): 80 ug via INTRAVENOUS
  Administered 2013-05-20 (×2): 120 ug via INTRAVENOUS

## 2013-05-20 MED ORDER — SODIUM CHLORIDE 0.9 % IV SOLN
10.0000 mg | INTRAVENOUS | Status: DC | PRN
  Administered 2013-05-20: 10 ug/min via INTRAVENOUS

## 2013-05-20 MED ORDER — LACTATED RINGERS IV SOLN
INTRAVENOUS | Status: DC | PRN
  Administered 2013-05-20: 07:00:00 via INTRAVENOUS

## 2013-05-20 MED ORDER — PHENOL 1.4 % MT LIQD: 1.0000 | OROMUCOSAL | Status: DC | PRN

## 2013-05-20 SURGICAL SUPPLY — 64 items
ADH SKN CLS APL DERMABOND .7 (GAUZE/BANDAGES/DRESSINGS) ×2
APL SKNCLS STERI-STRIP NONHPOA (GAUZE/BANDAGES/DRESSINGS)
BAG DECANTER FOR FLEXI CONT (MISCELLANEOUS) ×2 IMPLANT
BENZOIN TINCTURE PRP APPL 2/3 (GAUZE/BANDAGES/DRESSINGS) IMPLANT
BLADE SURG ROTATE 9660 (MISCELLANEOUS) IMPLANT
BRUSH SCRUB EZ PLAIN DRY (MISCELLANEOUS) ×2 IMPLANT
BUR ACORN 6.0 ACORN (BURR) ×1 IMPLANT
BUR ACRON 5.0MM COATED (BURR) ×1 IMPLANT
BUR MATCHSTICK NEURO 3.0 LAGG (BURR) ×2 IMPLANT
CANISTER SUCTION 2500CC (MISCELLANEOUS) ×2 IMPLANT
CLOTH BEACON ORANGE TIMEOUT ST (SAFETY) ×2 IMPLANT
CONT SPEC 4OZ CLIKSEAL STRL BL (MISCELLANEOUS) ×1 IMPLANT
DERMABOND ADVANCED (GAUZE/BANDAGES/DRESSINGS) ×2
DERMABOND ADVANCED .7 DNX12 (GAUZE/BANDAGES/DRESSINGS) IMPLANT
DRAPE LAPAROTOMY 100X72X124 (DRAPES) ×2 IMPLANT
DRAPE MICROSCOPE LEICA (MISCELLANEOUS) ×2 IMPLANT
DRAPE POUCH INSTRU U-SHP 10X18 (DRAPES) ×2 IMPLANT
DRSG EMULSION OIL 3X3 NADH (GAUZE/BANDAGES/DRESSINGS) IMPLANT
ELECT REM PT RETURN 9FT ADLT (ELECTROSURGICAL) ×2
ELECTRODE REM PT RTRN 9FT ADLT (ELECTROSURGICAL) ×1 IMPLANT
GAUZE SPONGE 4X4 16PLY XRAY LF (GAUZE/BANDAGES/DRESSINGS) IMPLANT
GLOVE BIOGEL M 8.0 STRL (GLOVE) ×1 IMPLANT
GLOVE BIOGEL PI IND STRL 7.0 (GLOVE) IMPLANT
GLOVE BIOGEL PI IND STRL 8 (GLOVE) ×1 IMPLANT
GLOVE BIOGEL PI INDICATOR 7.0 (GLOVE) ×4
GLOVE BIOGEL PI INDICATOR 8 (GLOVE) ×1
GLOVE ECLIPSE 7.5 STRL STRAW (GLOVE) ×2 IMPLANT
GLOVE EXAM NITRILE LRG STRL (GLOVE) IMPLANT
GLOVE EXAM NITRILE MD LF STRL (GLOVE) IMPLANT
GLOVE EXAM NITRILE XL STR (GLOVE) IMPLANT
GLOVE EXAM NITRILE XS STR PU (GLOVE) IMPLANT
GLOVE SS BIOGEL STRL SZ 6.5 (GLOVE) IMPLANT
GLOVE SUPERSENSE BIOGEL SZ 6.5 (GLOVE) ×1
GLOVE SURG SS PI 7.0 STRL IVOR (GLOVE) ×2 IMPLANT
GOWN BRE IMP SLV AUR LG STRL (GOWN DISPOSABLE) ×3 IMPLANT
GOWN BRE IMP SLV AUR XL STRL (GOWN DISPOSABLE) ×1 IMPLANT
GOWN STRL REIN 2XL LVL4 (GOWN DISPOSABLE) IMPLANT
KIT BASIN OR (CUSTOM PROCEDURE TRAY) ×2 IMPLANT
KIT ROOM TURNOVER OR (KITS) ×2 IMPLANT
NDL HYPO 18GX1.5 BLUNT FILL (NEEDLE) IMPLANT
NDL SPNL 18GX3.5 QUINCKE PK (NEEDLE) ×1 IMPLANT
NDL SPNL 22GX3.5 QUINCKE BK (NEEDLE) ×1 IMPLANT
NEEDLE HYPO 18GX1.5 BLUNT FILL (NEEDLE) ×2 IMPLANT
NEEDLE SPNL 18GX3.5 QUINCKE PK (NEEDLE) ×2 IMPLANT
NEEDLE SPNL 22GX3.5 QUINCKE BK (NEEDLE) ×2 IMPLANT
NS IRRIG 1000ML POUR BTL (IV SOLUTION) ×2 IMPLANT
PACK LAMINECTOMY NEURO (CUSTOM PROCEDURE TRAY) ×2 IMPLANT
PAD ARMBOARD 7.5X6 YLW CONV (MISCELLANEOUS) ×6 IMPLANT
PATTIES SURGICAL .5 X1 (DISPOSABLE) ×1 IMPLANT
RUBBERBAND STERILE (MISCELLANEOUS) ×4 IMPLANT
SPONGE GAUZE 4X4 12PLY (GAUZE/BANDAGES/DRESSINGS) IMPLANT
SPONGE LAP 4X18 X RAY DECT (DISPOSABLE) IMPLANT
SPONGE SURGIFOAM ABS GEL SZ50 (HEMOSTASIS) ×2 IMPLANT
STRIP CLOSURE SKIN 1/2X4 (GAUZE/BANDAGES/DRESSINGS) IMPLANT
SUT PROLENE 6 0 BV (SUTURE) IMPLANT
SUT VIC AB 1 CT1 18XBRD ANBCTR (SUTURE) ×1 IMPLANT
SUT VIC AB 1 CT1 8-18 (SUTURE) ×2
SUT VIC AB 2-0 CP2 18 (SUTURE) ×2 IMPLANT
SUT VIC AB 3-0 SH 8-18 (SUTURE) IMPLANT
SYR 20ML ECCENTRIC (SYRINGE) ×2 IMPLANT
SYR 5ML LL (SYRINGE) ×1 IMPLANT
TOWEL OR 17X24 6PK STRL BLUE (TOWEL DISPOSABLE) ×2 IMPLANT
TOWEL OR 17X26 10 PK STRL BLUE (TOWEL DISPOSABLE) ×2 IMPLANT
WATER STERILE IRR 1000ML POUR (IV SOLUTION) ×2 IMPLANT

## 2013-05-20 NOTE — Anesthesia Postprocedure Evaluation (Signed)
  Anesthesia Post-op Note  Patient: Misty [name]  Procedure(s) Performed: Procedure(s) with comments: LUMBAR LAMINECTOMY/DECOMPRESSION MICRODISCECTOMY 1 LEVEL (Bilateral) - Bilateral Lumbar four-five laminotomy and left lumbar four-five microdiskectomy  Patient Location: PACU  Anesthesia Type:General  Level of Consciousness: awake, alert , oriented and patient cooperative  Airway and Oxygen Therapy: Patient Spontanous Breathing and Patient connected to nasal cannula oxygen  Post-op Pain: none  Post-op Assessment: Post-op Vital signs reviewed, Patient's Cardiovascular Status Stable, Respiratory Function Stable, Patent Airway, No signs of Nausea or vomiting and Pain level controlled  Post-op Vital Signs: Reviewed and stable  Complications: No apparent anesthesia complications

## 2013-05-20 NOTE — Anesthesia Preprocedure Evaluation (Addendum)
Anesthesia Evaluation  Patient identified by MRN, date of birth, ID band Patient awake    Reviewed: Allergy & Precautions, H&P , NPO status , Patient's Chart, lab work & pertinent test results, reviewed documented beta blocker date and time   History of Anesthesia Complications (+) PONV  Airway Mallampati: II TM Distance: >3 FB Neck ROM: Full    Dental  (+) Edentulous Upper, Partial Lower and Dental Advisory Given   Pulmonary COPD (only prn ) COPD inhaler, Current Smoker,  breath sounds clear to auscultation  Pulmonary exam normal       Cardiovascular negative cardio ROS  Rhythm:Regular Rate:Normal     Neuro/Psych Chronic back pain: tramadol    GI/Hepatic Neg liver ROS, GERD-  Medicated and Poorly Controlled,  Endo/Other  negative endocrine ROS  Renal/GU negative Renal ROS     Musculoskeletal   Abdominal   Peds  Hematology   Anesthesia Other Findings   Reproductive/Obstetrics                           Anesthesia Physical Anesthesia Plan  ASA: III  Anesthesia Plan: General   Post-op Pain Management:    Induction: Intravenous  Airway Management Planned: Oral ETT  Additional Equipment:   Intra-op Plan:   Post-operative Plan: Extubation in OR  Informed Consent: I have reviewed the patients History and Physical, chart, labs and discussed the procedure including the risks, benefits and alternatives for the proposed anesthesia with the patient or authorized representative who has indicated his/her understanding and acceptance.   Dental advisory given  Plan Discussed with: CRNA and Surgeon  Anesthesia Plan Comments: (Plan routine monitors, GETA)        Anesthesia Quick Evaluation

## 2013-05-20 NOTE — Preoperative (Signed)
Beta Blockers   Reason not to administer Beta Blockers:Not Applicable 

## 2013-05-20 NOTE — Op Note (Signed)
05/20/2013  9:27 AM  PATIENT:  Misty [name]  72 y.o. female  PRE-OPERATIVE DIAGNOSIS:  lumbar stenosis lumbar herniated disc lumbar spondylosis  POST-OPERATIVE DIAGNOSIS:  lumbar stenosis lumbar herniated disc lumbar spondylosis  PROCEDURE:  Procedure(s): LUMBAR LAMINECTOMY/DECOMPRESSION MICRODISCECTOMY 1 LEVEL:  Bilateral L4-5 lumbar decompression including laminotomy and medial facetectomy, and left L4-5 microdiscectomy with microdissection, microsurgical technique, and the operating microscope  SURGEON:  Surgeon(s): Hewitt Shorts, MD  ANESTHESIA:   general  EBL:  Total I/O In: 800 [I.V.:800] Out: 50 [Blood:50]  COUNT: Correct per nursing staff  DICTATION: Patient was brought to the operating room and placed under general endotracheal anesthesia. Patient was turned to prone position the lumbar region was prepped with Betadine soap and solution and draped in a sterile fashion. The midline was infiltrated with local anesthetic with epinephrine. A localizing x-ray was taken and the L4-5 level was identified. Midline incision was made over the L4-5 level and was carried down through the subcutaneous tissue to the lumbar fascia. The lumbar fascia was incised bilaterally and the paraspinal muscles were dissected from the spinous processes and lamina in a subperiosteal fashion. Another x-ray was taken and the L4-5 intralaminar space was identified. The operating microscope was draped and brought into the field provided additional magnification, illumination, and visualization. Bilateral laminotomy was performed using the high-speed drill and Kerrison punches. The ligamentum flavum was carefully resected. Medial facetectomy was performed bilaterally. The underlying thecal sac and nerve roots were identified bilaterally. Foraminotomies were performed for the L4 and L5 nerve roots bilaterally, decompressing the stenotic compression of the exiting L4 and L5 nerve roots. The disc herniation was  identified on the left side, located caudally beneath the L4-5 disc space, behind the body of L5.  The thecal sac and nerve root gently retracted medially. We mobilize this fragment, and was removed in a piecemeal fashion with pituitary rongeurs. We examined the annulus of the disc, at the level of the disc space. The disc was degenerated and bulging, but it was felt that adequate decompression of the thecal sac and spinal canal had been achieved, and that proceeding with an intradiscal discectomy would not achieve without much more decompression, and would lead to significant instability. It was therefore felt that having removed the fragment down behind the body of L5 was the optimal extent of discectomy. Once the discectomy was completed and good decompression of the thecal sac and nerve roots had been achieved, hemostasis was established with the use of bipolar cautery and Gelfoam with thrombin. The Gelfoam was removed and hemostasis confirmed. We then instilled 2 cc of fentanyl and 80 mg of Depo-Medrol into the epidural space. Deep fascia was closed with interrupted undyed 1 Vicryl sutures. Scarpa's fascia was closed with interrupted undyed 1 Vicryl sutures in the subcutaneous and subcuticular layer were closed with interrupted inverted 2-0 undyed Vicryl sutures. The skin edges were approximated with Dermabond. Following surgery the patient was turned back to a supine position to be reversed from the anesthetic extubated and transferred to the recovery room for further care.   PLAN OF CARE: Admit for overnight observation  PATIENT DISPOSITION:  PACU - hemodynamically stable.   Delay start of Pharmacological VTE agent (>24hrs) due to surgical blood loss or risk of bleeding:  yes

## 2013-05-20 NOTE — Progress Notes (Signed)
Filed Vitals:   05/20/13 1038 05/20/13 1100 05/20/13 1230 05/20/13 1642  BP: 129/58 135/68 130/75 159/77  Pulse: 64 64 64 68  Temp: 96.8 F (36 C) 97.6 F (36.4 C) 97.6 F (36.4 C) 97.6 F (36.4 C)  TempSrc:      Resp: 14 18 18 20   SpO2:  93% 93% 94%    Patient resting comfortably in bed, has had some nausea and vomiting. Has had some limited ambulation so far. Wound clean and dry. Moving all 4 extremities well. Encouraged to ambulate. Has voided.  Plan: Continue progress of postoperative recovery.  Hewitt Shorts, MD 05/20/2013, 5:24 PM

## 2013-05-20 NOTE — H&P (Signed)
Subjective: Patient is a 72 y.o. female who is admitted for treatment of marked L4-5 lumbar stenosis, with a superimposed left L4-5 disc herniation, that has migrated caudally behind the body of L5. Symptomatically the patient having pain in her back which become increasingly severe and disabling, extending down to the lower extremities bilaterally, left worse than right. She also has numbness to the left lower extremity. Patient is admitted for a bilateral L4-5 lumbar laminotomy, and left L4-5 microdiscectomy    Patient Active Problem List   Diagnosis Date Noted  . GERD (gastroesophageal reflux disease) 04/07/2013  . Low bone mass 04/07/2013   Past Medical History  Diagnosis Date  . Migraines   . Osteoporosis   . Hyperlipidemia   . PONV (postoperative nausea and vomiting)   . History of bronchitis   . History of migraine     many yrs ago  . Numbness     left leg  . Chronic back pain   . Arthritis     back  . GERD (gastroesophageal reflux disease)     takes Ranidine daily  . Constipation     OTC stool softener prn  . Hemorrhoids   . Diverticulosis   . Urinary frequency   . Urinary urgency   . Nocturia   . Depression   . Anxiety     takes Xanax daily  . Insomnia   . History of shingles     Past Surgical History  Procedure Laterality Date  . Abdominal hysterectomy    . Esophagogastroduodenoscopy    . Colonoscopy      Prescriptions prior to admission  Medication Sig Dispense Refill  . ALPRAZolam (XANAX) 0.5 MG tablet Take 0.5 mg by mouth 3 (three) times daily as needed for anxiety.       . cholecalciferol (VITAMIN D) 1000 UNITS tablet Take 1,000 Units by mouth daily. 2 tablets daily except 4 on Sat and Sun      . Cyanocobalamin (VITAMIN B 12 PO) Take 1 tablet by mouth daily.      . Multiple Vitamins-Minerals (CENTRUM SILVER PO) Take 1 tablet by mouth daily.      . ranitidine (ZANTAC) 150 MG tablet Take 150 mg by mouth at bedtime.      . traMADol (ULTRAM) 50 MG tablet  Take 50 mg by mouth 2 (two) times daily.       Allergies  Allergen Reactions  . Asa (Aspirin)   . Azithromycin   . Celebrex (Celecoxib)   . Codeine   . Cymbalta (Duloxetine Hcl)   . Vioxx (Rofecoxib)   . Zelnorm (Tegaserod)   . Zocor (Simvastatin)   . Prednisone Rash    She has seen the podiatrist and he had injected cortisone in her feet and she had also taken a peel at home. She had a severe reaction to her face with a rash and had to take Benadryl to resolve the symptom. She actually did get more cortisone shots, but no additional reactions to the shots.    History  Substance Use Topics  . Smoking status: Current Every Day Smoker -- 1.00 packs/day for 52 years    Types: Cigarettes    Start date: 11/18/1958  . Smokeless tobacco: Not on file  . Alcohol Use: No    Family History  Problem Relation Age of Onset  . Hypertension Mother      Review of Systems A comprehensive review of systems was negative.  Objective: Vital signs in last 24 hours: Temp:  [  97.6 F (36.4 C)] 97.6 F (36.4 C) (07/03 0550) Pulse Rate:  [77] 77 (07/03 0550) Resp:  [18] 18 (07/03 0550) BP: (130)/(69) 130/69 mmHg (07/03 0550) SpO2:  [93 %] 93 % (07/03 0550)  EXAM: Patient is a thin white female in no acute distress. Lungs are clear to auscultation , the patient has symmetrical respiratory excursion. Heart has a regular rate and rhythm normal S1 and S2 no murmur.   Abdomen is soft nontender nondistended bowel sounds are present. Extremity examination shows no clubbing cyanosis or edema. Motor examination shows 5 over 5 strength in the lower extremities including the iliopsoas quadriceps dorsiflexor extensor hallicus  longus and plantar flexor bilaterally. Sensation is intact to pinprick in the distal lower extremities. Reflexes are symmetrical bilaterally. No pathologic reflexes are present. Patient has a normal gait and stance.   Data Review:CBC    Component Value Date/Time   WBC 6.9 05/13/2013  1031   RBC 4.49 05/13/2013 1031   HGB 13.6 05/13/2013 1031   HCT 41.5 05/13/2013 1031   PLT 179 05/13/2013 1031   MCV 92.4 05/13/2013 1031   MCH 30.3 05/13/2013 1031   MCHC 32.8 05/13/2013 1031   RDW 14.5 05/13/2013 1031                          BMET    Component Value Date/Time   NA 142 05/13/2013 1031   K 4.0 05/13/2013 1031   CL 106 05/13/2013 1031   CO2 30 05/13/2013 1031   GLUCOSE 99 05/13/2013 1031   BUN 8 05/13/2013 1031   CREATININE 0.75 05/13/2013 1031   CALCIUM 8.9 05/13/2013 1031   GFRNONAA 83* 05/13/2013 1031   GFRAA >90 05/13/2013 1031     Assessment/Plan: Patient with marked stenosis at the L4-5 level, and a superimposed left L4-5 disc and it has migrated caudally behind the body of L5. She is admitted for bilateral L4-5 lumbar laminotomy, and a left L4-5 microdiscectomy.  I've discussed with the patient the nature of his condition, the nature the surgical procedure, the typical length of surgery, hospital stay, and overall recuperation. We discussed limitations postoperatively. I discussed risks of surgery including risks of infection, bleeding, possibly need for transfusion, the risk of nerve root dysfunction with pain, weakness, numbness, or paresthesias, or risk of dural tear and CSF leakage and possible need for further surgery, the risk of recurrent disc herniation and the possible need for further surgery, and the risk of anesthetic complications including myocardial infarction, stroke, pneumonia, and death. Understanding all this the patient does wish to proceed with surgery and is admitted for such.    Hewitt Shorts, MD 05/20/2013 7:25 AM

## 2013-05-20 NOTE — Transfer of Care (Signed)
Immediate Anesthesia Transfer of Care Note  Patient: Misty [name]  Procedure(s) Performed: Procedure(s) with comments: LUMBAR LAMINECTOMY/DECOMPRESSION MICRODISCECTOMY 1 LEVEL (Bilateral) - Bilateral Lumbar four-five laminotomy and left lumbar four-five microdiskectomy  Patient Location: PACU  Anesthesia Type:General  Level of Consciousness: awake, alert  and oriented  Airway & Oxygen Therapy: Patient Spontanous Breathing and Patient connected to face mask oxygen  Post-op Assessment: Report given to PACU RN, Post -op Vital signs reviewed and stable and Patient moving all extremities X 4  Post vital signs: Reviewed and stable  Complications: No apparent anesthesia complications

## 2013-05-20 NOTE — Plan of Care (Signed)
Problem: Consults Goal: Diagnosis - Spinal Surgery Outcome: Completed/Met Date Met:  05/20/13 Microdiscectomy     

## 2013-05-20 NOTE — Anesthesia Procedure Notes (Signed)
Procedure Name: Intubation Date/Time: 05/20/2013 7:38 AM Performed by: Quentin Ore Pre-anesthesia Checklist: Patient identified, Emergency Drugs available, Suction available, Patient being monitored and Timeout performed Patient Re-evaluated:Patient Re-evaluated prior to inductionOxygen Delivery Method: Circle system utilized Preoxygenation: Pre-oxygenation with 100% oxygen Intubation Type: IV induction Ventilation: Mask ventilation without difficulty Laryngoscope Size: Mac and 3 Grade View: Grade I Tube type: Oral Tube size: 7.5 mm Number of attempts: 1 Airway Equipment and Method: Stylet and LTA kit utilized Placement Confirmation: ETT inserted through vocal cords under direct vision,  positive ETCO2 and breath sounds checked- equal and bilateral Secured at: 21 cm Tube secured with: Tape Dental Injury: Teeth and Oropharynx as per pre-operative assessment

## 2013-05-21 DIAGNOSIS — J449 Chronic obstructive pulmonary disease, unspecified: Secondary | ICD-10-CM | POA: Diagnosis not present

## 2013-05-21 DIAGNOSIS — M47817 Spondylosis without myelopathy or radiculopathy, lumbosacral region: Secondary | ICD-10-CM | POA: Diagnosis not present

## 2013-05-21 DIAGNOSIS — F17200 Nicotine dependence, unspecified, uncomplicated: Secondary | ICD-10-CM | POA: Diagnosis not present

## 2013-05-21 DIAGNOSIS — Z01812 Encounter for preprocedural laboratory examination: Secondary | ICD-10-CM | POA: Diagnosis not present

## 2013-05-21 DIAGNOSIS — M5126 Other intervertebral disc displacement, lumbar region: Secondary | ICD-10-CM | POA: Diagnosis not present

## 2013-05-21 DIAGNOSIS — R112 Nausea with vomiting, unspecified: Secondary | ICD-10-CM | POA: Diagnosis not present

## 2013-05-21 NOTE — Discharge Summary (Signed)
Physician Discharge Summary  Patient ID: MYCAH MCDOUGALL MRN: [id number] DOB/AGE: 1941-03-17 72 y.o.  Admit date: 05/20/2013 Discharge date: 05/21/2013  Admission Diagnoses:  lumbar stenosis lumbar herniated disc lumbar spondylosis  Discharge Diagnoses:  lumbar stenosis lumbar herniated disc lumbar spondylosis  Discharged Condition: good  Hospital Course: Patient was admitted underwent the bilateral L4-5 lumbar laminotomy, and a left L4-5 lumbar microdiscectomy. Postoperative she is done well. She's had good relief of her left lumbar radiculopathy. She is up and ambulate. She did have some nausea and vomiting initially following surgery, but that has resolved. Her wound is clean and dry, and healing well, although there is mild bruising in the surrounding soft tissues. She is being discharged home with instructions regarding wound care and activities. She is to return for followup with me in 3 weeks.  Discharge Exam: Blood pressure 84/53, pulse 68, temperature 98.4 F (36.9 C), temperature source Oral, resp. rate 18, SpO2 95.00%.  Disposition: Home all   Future Appointments Provider Department Dept Phone   08/19/2013 11:30 AM Ernestina Penna, MD [facility] [phone]       Medication List         ALPRAZolam 0.5 MG tablet  Commonly known as:  XANAX  Take 0.5 mg by mouth 3 (three) times daily as needed for anxiety.     CENTRUM SILVER PO  Take 1 tablet by mouth daily.     cholecalciferol 1000 UNITS tablet  Commonly known as:  VITAMIN D  Take 1,000 Units by mouth daily. 2 tablets daily except 4 on Sat and Sun     ranitidine 150 MG tablet  Commonly known as:  ZANTAC  Take 150 mg by mouth at bedtime.     traMADol 50 MG tablet  Commonly known as:  ULTRAM  Take 50 mg by mouth 2 (two) times daily.     VITAMIN B 12 PO  Take 1 tablet by mouth daily.         Signed: Hewitt Shorts, MD 05/21/2013, 8:49 AM

## 2013-05-21 NOTE — Progress Notes (Signed)
Pt. Alert and oriented,follows simple instructions, denies pain. Incision area without swelling, redness or S/S of infection. Voiding adequate clear yellow urine. Moving all extremities well and vitals stable and documented. Lumbar surgery notes instructions given to patient and family member for home safety and precautions. Pt. and family stated understanding of instructions given.  

## 2013-05-25 ENCOUNTER — Encounter (HOSPITAL_COMMUNITY): Payer: Self-pay | Admitting: Neurosurgery

## 2013-05-27 ENCOUNTER — Telehealth: Payer: Self-pay | Admitting: Pharmacist

## 2013-05-27 NOTE — Telephone Encounter (Signed)
Insurance inquiry determined that Prolia covered 100% (primary and secondary insurance) Called patient to let her know but she recently had back surgery.  Should postpone Prolia until healed from surgery.  Will follow up with patient in 1 month.

## 2013-07-07 ENCOUNTER — Telehealth: Payer: Self-pay | Admitting: Pharmacist

## 2013-07-07 NOTE — Telephone Encounter (Signed)
Message copied by Henrene Pastor on Wed Jul 07, 2013  2:00 PM ------      Message from: Henrene Pastor      Created: Thu May 27, 2013  3:28 PM       Call patient to see if ready to schedule Prolia injection (had back surgery 05/20/13) ------

## 2013-07-07 NOTE — Telephone Encounter (Signed)
Called to follow up Prolia injection.  Patient's copay would be $0.  She states that she is still recovering from back surgery and that she does not want to start any new medications at this time.  She will discuss Prolia with Dr Christell Constant at next appt in October 2014.

## 2013-07-09 DIAGNOSIS — M545 Low back pain, unspecified: Secondary | ICD-10-CM | POA: Diagnosis not present

## 2013-07-09 DIAGNOSIS — R209 Unspecified disturbances of skin sensation: Secondary | ICD-10-CM | POA: Diagnosis not present

## 2013-07-14 ENCOUNTER — Other Ambulatory Visit: Payer: Self-pay

## 2013-07-14 MED ORDER — ALPRAZOLAM 0.5 MG PO TABS: 0.5000 mg | ORAL_TABLET | Freq: Three times a day (TID) | ORAL | Status: DC | PRN

## 2013-07-14 NOTE — Telephone Encounter (Signed)
Last seen 04/08/13  DWM   If approved route to nurse to phone in

## 2013-07-14 NOTE — Telephone Encounter (Signed)
Called in RX for Xanax # 90 with 5 additional refills Ok per [facility]

## 2013-07-14 NOTE — Telephone Encounter (Signed)
This is okay to refill for six-month 

## 2013-07-27 ENCOUNTER — Telehealth: Payer: Self-pay | Admitting: *Deleted

## 2013-07-27 MED ORDER — TRAMADOL HCL 50 MG PO TABS: 50.0000 mg | ORAL_TABLET | Freq: Two times a day (BID) | ORAL | Status: DC

## 2013-07-27 NOTE — Telephone Encounter (Signed)
Needs rf on tramadol please - is out

## 2013-07-27 NOTE — Telephone Encounter (Signed)
it is okay to refill this with additional refills x2

## 2013-08-19 ENCOUNTER — Encounter: Payer: Self-pay | Admitting: Family Medicine

## 2013-08-19 ENCOUNTER — Ambulatory Visit (INDEPENDENT_AMBULATORY_CARE_PROVIDER_SITE_OTHER): Payer: Medicare Other | Admitting: Family Medicine

## 2013-08-19 VITALS — BP 110/70 | HR 88 | Temp 96.8°F | Ht 67.0 in | Wt 123.0 lb

## 2013-08-19 DIAGNOSIS — R5381 Other malaise: Secondary | ICD-10-CM | POA: Diagnosis not present

## 2013-08-19 DIAGNOSIS — N6019 Diffuse cystic mastopathy of unspecified breast: Secondary | ICD-10-CM | POA: Insufficient documentation

## 2013-08-19 DIAGNOSIS — R634 Abnormal weight loss: Secondary | ICD-10-CM

## 2013-08-19 DIAGNOSIS — M899 Disorder of bone, unspecified: Secondary | ICD-10-CM | POA: Diagnosis not present

## 2013-08-19 DIAGNOSIS — E559 Vitamin D deficiency, unspecified: Secondary | ICD-10-CM

## 2013-08-19 DIAGNOSIS — M8580 Other specified disorders of bone density and structure, unspecified site: Secondary | ICD-10-CM

## 2013-08-19 DIAGNOSIS — Z8669 Personal history of other diseases of the nervous system and sense organs: Secondary | ICD-10-CM | POA: Insufficient documentation

## 2013-08-19 DIAGNOSIS — Z23 Encounter for immunization: Secondary | ICD-10-CM

## 2013-08-19 DIAGNOSIS — E785 Hyperlipidemia, unspecified: Secondary | ICD-10-CM | POA: Diagnosis not present

## 2013-08-19 DIAGNOSIS — K219 Gastro-esophageal reflux disease without esophagitis: Secondary | ICD-10-CM | POA: Diagnosis not present

## 2013-08-19 DIAGNOSIS — R5383 Other fatigue: Secondary | ICD-10-CM

## 2013-08-19 LAB — POCT CBC
Granulocyte percent: 68 %G (ref 37–80)
Hemoglobin: 14.3 g/dL (ref 12.2–16.2)
Lymph, poc: 2.2 (ref 0.6–3.4)
MCV: 90.9 fL (ref 80–97)
MPV: 8.7 fL (ref 0–99.8)
POC LYMPH PERCENT: 26.8 %L (ref 10–50)
Platelet Count, POC: 207 10*3/uL (ref 142–424)
RBC: 4.7 M/uL (ref 4.04–5.48)

## 2013-08-19 NOTE — Progress Notes (Signed)
Subjective:    Patient ID: Misty [name], female    DOB: 1941-06-27, 72 y.o.   MRN: [id number]  HPI Pt here for follow up and management of chronic medical problems. It is important to note that patient had a lumbar laminectomy and decompression by Dr.Nudelman, the neurosurgeon in July of this summer. She still has some back pain but she is doing better than she was prior to the surgery. She is also due to get her mammogram. She will schedule to get a pelvic exam. She will be given FOBT today.     Patient Active Problem List   Diagnosis Date Noted  . GERD (gastroesophageal reflux disease) 04/07/2013  . Low bone mass 04/07/2013   Outpatient Encounter Prescriptions as of 08/19/2013  Medication Sig Dispense Refill  . ALPRAZolam (XANAX) 0.5 MG tablet Take 1 tablet (0.5 mg total) by mouth 3 (three) times daily as needed for anxiety.  90 tablet  0  . cholecalciferol (VITAMIN D) 1000 UNITS tablet Take 1,000 Units by mouth daily. 2 tablets daily except 4 on Sat and Sun      . Cyanocobalamin (VITAMIN B 12 PO) Take 1 tablet by mouth daily.      . Multiple Vitamins-Minerals (CENTRUM SILVER PO) Take 1 tablet by mouth daily.      . ranitidine (ZANTAC) 150 MG tablet Take 150 mg by mouth at bedtime.      . traMADol (ULTRAM) 50 MG tablet Take 1 tablet (50 mg total) by mouth 2 (two) times daily.  60 tablet  2   No facility-administered encounter medications on file as of 08/19/2013.    Review of Systems  Constitutional: Negative.   HENT: Negative.   Eyes: Negative.   Respiratory: Negative.   Cardiovascular: Negative.   Gastrointestinal: Negative.   Endocrine: Negative.   Genitourinary: Negative.   Musculoskeletal: Positive for back pain (3 months post op).  Skin: Negative.   Allergic/Immunologic: Negative.   Neurological: Negative.   Hematological: Negative.   Psychiatric/Behavioral: Negative.        Objective:   Physical Exam  Nursing note and vitals reviewed. Constitutional: She is  oriented to person, place, and time. No distress.  And and somewhat frail appearing  HENT:  Head: Normocephalic and atraumatic.  Right Ear: External ear normal.  Left Ear: External ear normal.  Nose: Nose normal.  Mouth/Throat: Oropharynx is clear and moist.  Eyes: Conjunctivae and EOM are normal. Right eye exhibits no discharge. Left eye exhibits no discharge. No scleral icterus.  Some ear cerumen bilaterally  Neck: Normal range of motion. Neck supple. No thyromegaly present.  Cardiovascular: Normal rate, normal heart sounds and intact distal pulses.  Exam reveals no gallop and no friction rub.   No murmur heard. At 72 per minute very slightly irregular  Pulmonary/Chest: Effort normal and breath sounds normal. No respiratory distress. She has no wheezes. She has no rales.  Abdominal: Soft. Bowel sounds are normal. She exhibits no mass. There is no tenderness. There is no rebound and no guarding.  Musculoskeletal: Normal range of motion. She exhibits tenderness (slightly tender over her low back). She exhibits no edema.  Lymphadenopathy:    She has no cervical adenopathy.  Neurological: She is alert and oriented to person, place, and time. She has normal reflexes. No cranial nerve deficit.  Surgical scar healing well lumbar spine  Skin: Skin is warm and dry. No rash noted.  Psychiatric: She has a normal mood and affect. Her behavior is normal. Judgment and  thought content normal.    BP 110/70  Pulse 88  Temp(Src) 96.8 F (36 C) (Oral)  Ht 5\' 7"  (1.702 m)  Wt 123 lb (55.792 kg)  BMI 19.26 kg/m2       Assessment & Plan:   1. Loss of weight   2. GERD (gastroesophageal reflux disease)   3. Low bone mass   4. Hyperlipidemia   5. Vitamin D deficiency   6. Fibrocystic breast disease, unspecified laterality   7. History of migraine headaches   8. Fatigue    Orders Placed This Encounter  Procedures  . Hepatic function panel  . BMP8+EGFR  . Lipid panel  . Vit D  25 hydroxy  (rtn osteoporosis monitoring)  . POCT CBC   No orders of the defined types were placed in this encounter.   Patient Instructions  Continue current medications. Continue good therapeutic lifestyle changes.  Fall precautions discussed with patient. Follow up as planned and earlier as needed.  Try to build up endurance by walking regularly Continue to drink plenty of fluids   Nyra Capes MD

## 2013-08-19 NOTE — Addendum Note (Signed)
Addended by: Magdalene River on: 08/19/2013 02:54 PM   Modules accepted: Orders

## 2013-08-19 NOTE — Patient Instructions (Addendum)
Continue current medications. Continue good therapeutic lifestyle changes.  Fall precautions discussed with patient. Follow up as planned and earlier as needed.  Try to build up endurance by walking regularly Continue to drink plenty of fluids

## 2013-08-20 LAB — HEPATIC FUNCTION PANEL
Albumin: 4.5 g/dL (ref 3.5–4.8)
Bilirubin, Direct: 0.08 mg/dL (ref 0.00–0.40)
Total Bilirubin: 0.2 mg/dL (ref 0.0–1.2)

## 2013-08-20 LAB — LIPID PANEL
Chol/HDL Ratio: 3 ratio units (ref 0.0–4.4)
HDL: 49 mg/dL (ref >39)
LDL Calculated: 75 mg/dL (ref 0–99)
VLDL Cholesterol Cal: 21 mg/dL (ref 5–40)

## 2013-08-20 LAB — VITAMIN D 25 HYDROXY (VIT D DEFICIENCY, FRACTURES): Vit D, 25-Hydroxy: 41.8 ng/mL (ref 30.0–100.0)

## 2013-08-20 LAB — BMP8+EGFR
BUN/Creatinine Ratio: 18 (ref 11–26)
Creatinine, Ser: 0.8 mg/dL (ref 0.57–1.00)
GFR calc non Af Amer: 74 mL/min/{1.73_m2} (ref >59)
Sodium: 142 mmol/L (ref 134–144)

## 2013-09-23 ENCOUNTER — Encounter: Payer: Self-pay | Admitting: *Deleted

## 2013-09-23 NOTE — Progress Notes (Signed)
Quick Note:  Copy of labs sent to patient ______ 

## 2013-10-18 ENCOUNTER — Other Ambulatory Visit (INDEPENDENT_AMBULATORY_CARE_PROVIDER_SITE_OTHER): Payer: Medicare Other

## 2013-10-18 DIAGNOSIS — Z1212 Encounter for screening for malignant neoplasm of rectum: Secondary | ICD-10-CM | POA: Diagnosis not present

## 2013-11-15 ENCOUNTER — Ambulatory Visit (INDEPENDENT_AMBULATORY_CARE_PROVIDER_SITE_OTHER): Payer: Medicare Other | Admitting: Family Medicine

## 2013-11-15 ENCOUNTER — Encounter: Payer: Self-pay | Admitting: Family Medicine

## 2013-11-15 VITALS — BP 106/72 | HR 90 | Temp 98.5°F | Ht 67.0 in | Wt 125.2 lb

## 2013-11-15 DIAGNOSIS — H1030 Unspecified acute conjunctivitis, unspecified eye: Secondary | ICD-10-CM | POA: Diagnosis not present

## 2013-11-15 DIAGNOSIS — H25019 Cortical age-related cataract, unspecified eye: Secondary | ICD-10-CM | POA: Diagnosis not present

## 2013-11-15 DIAGNOSIS — IMO0001 Reserved for inherently not codable concepts without codable children: Secondary | ICD-10-CM | POA: Diagnosis not present

## 2013-11-15 DIAGNOSIS — H2510 Age-related nuclear cataract, unspecified eye: Secondary | ICD-10-CM | POA: Diagnosis not present

## 2013-11-15 DIAGNOSIS — S0591XS Unspecified injury of right eye and orbit, sequela: Secondary | ICD-10-CM

## 2013-11-15 DIAGNOSIS — H25049 Posterior subcapsular polar age-related cataract, unspecified eye: Secondary | ICD-10-CM | POA: Diagnosis not present

## 2013-11-15 NOTE — Progress Notes (Signed)
   Subjective:    Patient ID: Misty [name], female    DOB: 1941/02/13, 72 y.o.   MRN: [id number]  HPI This 72 y.o. female presents for evaluation of right eye discomfort and blurred vision. She accidentally sprayed scented febreeze spray into her right eye and she has been Having persistent burning and discomfort and she has blurred vision in her right eye.   Review of Systems C/o blurred vision and discomfort in right eye. No chest pain, SOB, HA, dizziness, vision change, N/V, diarrhea, constipation, dysuria, urinary urgency or frequency, myalgias, arthralgias or rash.     Objective:   Physical Exam  Vital signs noted  Well developed well nourished female.  HEENT - Head atraumatic Normocephalic                Eyes - PERRLA, Conjuctiva - OD - injected and swollen OS - normal                Sclera- Clear EOMI.  She covers left eye and she has difficulty seeing                My face from 5 feet away.  Left eye uncovered and she can see my face well.                Nose - Nares patent                 Throat - oropharanx wnl Respiratory - Lungs CTA bilateral Cardiac - RRR S1 and S2 without murmur       Assessment & Plan:  Eye injury, right, sequela - Plan: Ambulatory referral to Ophthalmology. Discussed with patient the importance and need to see Opthalmology today and she  Agrees.  An office in [place] is contacted and patient agrees to go there and be seen.  Deatra Canter FNP

## 2013-12-01 ENCOUNTER — Encounter: Payer: Self-pay | Admitting: Pharmacist

## 2013-12-10 DIAGNOSIS — M5137 Other intervertebral disc degeneration, lumbosacral region: Secondary | ICD-10-CM | POA: Diagnosis not present

## 2013-12-10 DIAGNOSIS — M47817 Spondylosis without myelopathy or radiculopathy, lumbosacral region: Secondary | ICD-10-CM | POA: Diagnosis not present

## 2013-12-10 DIAGNOSIS — M48062 Spinal stenosis, lumbar region with neurogenic claudication: Secondary | ICD-10-CM | POA: Diagnosis not present

## 2013-12-10 DIAGNOSIS — M5126 Other intervertebral disc displacement, lumbar region: Secondary | ICD-10-CM | POA: Diagnosis not present

## 2013-12-16 ENCOUNTER — Encounter: Payer: Self-pay | Admitting: Family Medicine

## 2013-12-16 ENCOUNTER — Ambulatory Visit (INDEPENDENT_AMBULATORY_CARE_PROVIDER_SITE_OTHER): Payer: Medicare Other | Admitting: Family Medicine

## 2013-12-16 VITALS — BP 102/59 | HR 75 | Temp 97.5°F | Ht 67.0 in | Wt 127.0 lb

## 2013-12-16 DIAGNOSIS — Z23 Encounter for immunization: Secondary | ICD-10-CM | POA: Diagnosis not present

## 2013-12-16 DIAGNOSIS — E559 Vitamin D deficiency, unspecified: Secondary | ICD-10-CM | POA: Diagnosis not present

## 2013-12-16 DIAGNOSIS — R634 Abnormal weight loss: Secondary | ICD-10-CM

## 2013-12-16 DIAGNOSIS — E8881 Metabolic syndrome: Secondary | ICD-10-CM | POA: Diagnosis not present

## 2013-12-16 DIAGNOSIS — Z9889 Other specified postprocedural states: Secondary | ICD-10-CM

## 2013-12-16 DIAGNOSIS — K219 Gastro-esophageal reflux disease without esophagitis: Secondary | ICD-10-CM

## 2013-12-16 DIAGNOSIS — H269 Unspecified cataract: Secondary | ICD-10-CM

## 2013-12-16 DIAGNOSIS — E785 Hyperlipidemia, unspecified: Secondary | ICD-10-CM | POA: Diagnosis not present

## 2013-12-16 LAB — POCT CBC
Granulocyte percent: 71.3 %G (ref 37–80)
HEMATOCRIT: 44.3 % (ref 37.7–47.9)
HEMOGLOBIN: 14.1 g/dL (ref 12.2–16.2)
Lymph, poc: 1.9 (ref 0.6–3.4)
MCH, POC: 29.8 pg (ref 27–31.2)
MCHC: 31.8 g/dL (ref 31.8–35.4)
MCV: 93.7 fL (ref 80–97)
MPV: 9.8 fL (ref 0–99.8)
POC Granulocyte: 5.2 (ref 2–6.9)
POC LYMPH PERCENT: 25.7 %L (ref 10–50)
Platelet Count, POC: 169 10*3/uL (ref 142–424)
RBC: 4.7 M/uL (ref 4.04–5.48)
RDW, POC: 14.7 %
WBC: 7.3 10*3/uL (ref 4.6–10.2)

## 2013-12-16 LAB — POCT GLYCOSYLATED HEMOGLOBIN (HGB A1C)

## 2013-12-16 MED ORDER — ALPRAZOLAM 0.5 MG PO TABS: 0.5000 mg | ORAL_TABLET | Freq: Three times a day (TID) | ORAL | Status: DC | PRN

## 2013-12-16 MED ORDER — TRAMADOL HCL 50 MG PO TABS
50.0000 mg | ORAL_TABLET | Freq: Two times a day (BID) | ORAL | Status: DC
Start: 2013-12-16 — End: 2014-04-19

## 2013-12-16 NOTE — Patient Instructions (Addendum)
Continue current medications. Continue good therapeutic lifestyle changes which include good diet and exercise. Fall precautions discussed with patient. Schedule your flu vaccine if you haven't had it yet If you are over 73 years old - you may need Prevnar 13 or the adult Pneumonia vaccine. The Prevnar vaccine that he received today may make your arm is sore  Try to take the boost nourishment more regularly  We will call you with the results of the lab work once those results are available. We will arrange a visit with the ophthalmologist so that your cataract can be evaluated for possible surgery. Because of the weight loss we will arrange to get an ultrasound of your abdomen. When the weather gets warmer, try to get more walking in to strengthen your back. Continue to take her multivitamins.

## 2013-12-16 NOTE — Addendum Note (Signed)
Addended by: Magdalene River on: 12/16/2013 12:54 PM   Modules accepted: Orders

## 2013-12-16 NOTE — Progress Notes (Signed)
Subjective:    Patient ID: Misty [name], female    DOB: March 07, 1941, 73 y.o.   MRN: [id number]  HPI Pt here for follow up and management of chronic medical problems. Patient has a history of recent back surgery in July of 2014. She is due for Prevnar vaccine. She will also get her lab work drawn today.        Patient Active Problem List   Diagnosis Date Noted  . Hyperlipidemia 08/19/2013  . Fibrocystic breast disease 08/19/2013  . History of migraine headaches 08/19/2013  . GERD (gastroesophageal reflux disease) 04/07/2013  . Osteoporosis, postmenopausal 04/07/2013   Outpatient Encounter Prescriptions as of 12/16/2013  Medication Sig  . ALPRAZolam (XANAX) 0.5 MG tablet Take 1 tablet (0.5 mg total) by mouth 3 (three) times daily as needed for anxiety.  . cholecalciferol (VITAMIN D) 1000 UNITS tablet Take 1,000 Units by mouth daily. 2 tablets daily except 4 on Sat and Sun  . Cyanocobalamin (VITAMIN B 12 PO) Take 1 tablet by mouth daily.  . Multiple Vitamins-Minerals (CENTRUM SILVER PO) Take 1 tablet by mouth daily.  . ranitidine (ZANTAC) 150 MG tablet Take 150 mg by mouth at bedtime.  . traMADol (ULTRAM) 50 MG tablet Take 1 tablet (50 mg total) by mouth 2 (two) times daily.    Review of Systems  Constitutional: Negative.  Unexpected weight change: lost weight with surgery.  HENT: Negative.   Eyes: Negative.        Has cataract that needs to be removed- has not scheduled.  Respiratory: Negative.   Cardiovascular: Negative.   Gastrointestinal: Negative.   Endocrine: Negative.   Genitourinary: Negative.   Musculoskeletal: Positive for back pain.  Skin: Negative.   Allergic/Immunologic: Negative.   Neurological: Negative.   Hematological: Negative.   Psychiatric/Behavioral: Negative.        Objective:   Physical Exam  Nursing note and vitals reviewed. Constitutional: She is oriented to person, place, and time. She appears well-developed and well-nourished. No  distress.  Thin and somewhat underweight  HENT:  [name] Normocephalic and atraumatic.  Right Ear: External ear normal.  Left Ear: External ear normal.  Mouth/Throat: Oropharynx is clear and moist.  Nasal congestion bilateral  Eyes: Conjunctivae and EOM are normal. Pupils are equal, round, and reactive to light. Right eye exhibits no discharge. Left eye exhibits no discharge. No scleral icterus.  Neck: Normal range of motion. Neck supple. No thyromegaly present.  Cardiovascular: Normal rate, regular rhythm, normal heart sounds and intact distal pulses.  Exam reveals no gallop and no friction rub.   No murmur heard. At 72 per minute  Pulmonary/Chest: Effort normal and breath sounds normal. No respiratory distress. She has no wheezes. She has no rales. She exhibits no tenderness.  Abdominal: Soft. Bowel sounds are normal. She exhibits no mass. There is no tenderness. There is no rebound and no guarding.  Musculoskeletal: She exhibits no edema and no tenderness.  Some discomfort in back with laying down and sitting up secondary to recent surgery.  Lymphadenopathy:    She has no cervical adenopathy.  Neurological: She is alert and oriented to person, place, and time. She has normal reflexes. No cranial nerve deficit.  Skin: Skin is warm and dry.  Psychiatric: She has a normal mood and affect. Her behavior is normal. Judgment and thought content normal.   BP 102/59  Pulse 75  Temp(Src) 97.5 F (36.4 C) (Oral)  Ht $R'5\' 7"'mE$  (1.702 m)  Wt 127 lb (57.607 kg)  BMI 19.89 kg/m2  Cryotherapy to 3 irritated seborrheic keratoses, 2 on abdomen and one at the bra line on the back      Assessment & Plan:  1. GERD (gastroesophageal reflux disease) - POCT CBC  2. Hyperlipidemia - POCT CBC - BMP8+EGFR - Hepatic function panel - NMR, lipoprofile  3. Metabolic syndrome - POCT CBC - POCT glycosylated hemoglobin (Hb A1C) - BMP8+EGFR - Hepatic function panel  4. Vitamin D deficiency - Vit D  25  hydroxy (rtn osteoporosis monitoring)  5. Loss of weight -We will arrange an ultrasound of the abdomen to further evaluate this  6. History of back surgery -Recently released by the neurosurgeon  7. Cataracts, bilateral -Referral to ophthalmologist   Meds ordered this encounter  Medications  . traMADol (ULTRAM) 50 MG tablet    Sig: Take 1 tablet (50 mg total) by mouth 2 (two) times daily.    Dispense:  60 tablet    Refill:  0  . ALPRAZolam (XANAX) 0.5 MG tablet    Sig: Take 1 tablet (0.5 mg total) by mouth 3 (three) times daily as needed for anxiety.    Dispense:  90 tablet    Refill:  5       Patient Instructions  Continue current medications. Continue good therapeutic lifestyle changes which include good diet and exercise. Fall precautions discussed with patient. Schedule your flu vaccine if you haven't had it yet If you are over 62 years old - you may need Prevnar 12 or the adult Pneumonia vaccine. The Prevnar vaccine that he received today may make your arm is sore  Try to take the boost nourishment more regularly  We will call you with the results of the lab work once those results are available. We will arrange a visit with the ophthalmologist so that your cataract can be evaluated for possible surgery. Because of the weight loss we will arrange to get an ultrasound of your abdomen. When the weather gets warmer, try to get more walking in to strengthen your back. Continue to take her multivitamins.    Arrie Senate MD

## 2013-12-18 LAB — BMP8+EGFR
BUN/Creatinine Ratio: 16 (ref 11–26)
BUN: 12 mg/dL (ref 8–27)
CALCIUM: 9.5 mg/dL (ref 8.7–10.3)
CO2: 24 mmol/L (ref 18–29)
CREATININE: 0.75 mg/dL (ref 0.57–1.00)
Chloride: 103 mmol/L (ref 97–108)
GFR, EST AFRICAN AMERICAN: 92 mL/min/{1.73_m2} (ref >59)
GFR, EST NON AFRICAN AMERICAN: 80 mL/min/{1.73_m2} (ref >59)
Glucose: 101 mg/dL — ABNORMAL HIGH (ref 65–99)
POTASSIUM: 4.3 mmol/L (ref 3.5–5.2)
SODIUM: 143 mmol/L (ref 134–144)

## 2013-12-18 LAB — NMR, LIPOPROFILE
CHOLESTEROL: 164 mg/dL (ref <200)
HDL Cholesterol by NMR: 55 mg/dL (ref >=40)
HDL Particle Number: 34.7 umol/L (ref >=30.5)
LDL PARTICLE NUMBER: 1185 nmol/L — AB (ref <1000)
LDL SIZE: 20.7 nm (ref >20.5)
LDLC SERPL CALC-MCNC: 91 mg/dL (ref <100)
LP-IR SCORE: 26 (ref <=45)
SMALL LDL PARTICLE NUMBER: 606 nmol/L — AB (ref <=527)
TRIGLYCERIDES BY NMR: 88 mg/dL (ref <150)

## 2013-12-18 LAB — VITAMIN D 25 HYDROXY (VIT D DEFICIENCY, FRACTURES): Vit D, 25-Hydroxy: 40.7 ng/mL (ref 30.0–100.0)

## 2013-12-18 LAB — HEPATIC FUNCTION PANEL
ALK PHOS: 121 IU/L — AB (ref 39–117)
ALT: 16 IU/L (ref 0–32)
AST: 19 IU/L (ref 0–40)
Albumin: 4.3 g/dL (ref 3.5–4.8)
BILIRUBIN DIRECT: 0.09 mg/dL (ref 0.00–0.40)
BILIRUBIN TOTAL: 0.3 mg/dL (ref 0.0–1.2)
TOTAL PROTEIN: 6.4 g/dL (ref 6.0–8.5)

## 2013-12-23 ENCOUNTER — Ambulatory Visit (HOSPITAL_COMMUNITY)
Admission: RE | Admit: 2013-12-23 | Discharge: 2013-12-23 | Disposition: A | Discharge status: Home / Self Care | Payer: Medicare Other | Source: Ambulatory Visit | Attending: Family Medicine | Admitting: Family Medicine

## 2013-12-23 DIAGNOSIS — R634 Abnormal weight loss: Secondary | ICD-10-CM | POA: Diagnosis not present

## 2013-12-23 DIAGNOSIS — K7689 Other specified diseases of liver: Secondary | ICD-10-CM | POA: Diagnosis not present

## 2013-12-23 DIAGNOSIS — I700 Atherosclerosis of aorta: Secondary | ICD-10-CM | POA: Diagnosis not present

## 2013-12-23 DIAGNOSIS — I7090 Unspecified atherosclerosis: Secondary | ICD-10-CM | POA: Diagnosis not present

## 2013-12-27 ENCOUNTER — Other Ambulatory Visit: Payer: Self-pay | Admitting: *Deleted

## 2013-12-27 DIAGNOSIS — R160 Hepatomegaly, not elsewhere classified: Secondary | ICD-10-CM

## 2014-01-04 ENCOUNTER — Ambulatory Visit (HOSPITAL_COMMUNITY): Payer: Medicare Other

## 2014-01-05 ENCOUNTER — Ambulatory Visit (HOSPITAL_COMMUNITY): Admission: RE | Admit: 2014-01-05 | Payer: Medicare Other | Source: Ambulatory Visit

## 2014-01-13 ENCOUNTER — Ambulatory Visit (HOSPITAL_COMMUNITY): Payer: Medicare Other

## 2014-01-16 HISTORY — PX: EYE SURGERY: SHX253

## 2014-01-25 DIAGNOSIS — H2510 Age-related nuclear cataract, unspecified eye: Secondary | ICD-10-CM | POA: Diagnosis not present

## 2014-01-25 DIAGNOSIS — H538 Other visual disturbances: Secondary | ICD-10-CM | POA: Diagnosis not present

## 2014-01-25 DIAGNOSIS — H25049 Posterior subcapsular polar age-related cataract, unspecified eye: Secondary | ICD-10-CM | POA: Diagnosis not present

## 2014-01-31 ENCOUNTER — Ambulatory Visit (HOSPITAL_COMMUNITY)
Admission: RE | Admit: 2014-01-31 | Discharge: 2014-01-31 | Disposition: A | Discharge status: Home / Self Care | Payer: Medicare Other | Source: Ambulatory Visit | Attending: Family Medicine | Admitting: Family Medicine

## 2014-01-31 DIAGNOSIS — K7689 Other specified diseases of liver: Secondary | ICD-10-CM | POA: Insufficient documentation

## 2014-01-31 DIAGNOSIS — K769 Liver disease, unspecified: Secondary | ICD-10-CM | POA: Diagnosis not present

## 2014-01-31 DIAGNOSIS — R160 Hepatomegaly, not elsewhere classified: Secondary | ICD-10-CM

## 2014-01-31 LAB — POCT I-STAT CREATININE: CREATININE: 1 mg/dL (ref 0.50–1.10)

## 2014-01-31 MED ORDER — GADOBENATE DIMEGLUMINE 529 MG/ML IV SOLN
10.0000 mL | Freq: Once | INTRAVENOUS | Status: AC | PRN
  Administered 2014-01-31: 10 mL via INTRAVENOUS

## 2014-02-03 DIAGNOSIS — H25049 Posterior subcapsular polar age-related cataract, unspecified eye: Secondary | ICD-10-CM | POA: Diagnosis not present

## 2014-02-03 DIAGNOSIS — H2510 Age-related nuclear cataract, unspecified eye: Secondary | ICD-10-CM | POA: Diagnosis not present

## 2014-02-03 DIAGNOSIS — H538 Other visual disturbances: Secondary | ICD-10-CM | POA: Diagnosis not present

## 2014-02-07 DIAGNOSIS — H52209 Unspecified astigmatism, unspecified eye: Secondary | ICD-10-CM | POA: Diagnosis not present

## 2014-02-07 DIAGNOSIS — Z888 Allergy status to other drugs, medicaments and biological substances status: Secondary | ICD-10-CM | POA: Diagnosis not present

## 2014-02-07 DIAGNOSIS — H524 Presbyopia: Secondary | ICD-10-CM | POA: Diagnosis not present

## 2014-02-07 DIAGNOSIS — F17200 Nicotine dependence, unspecified, uncomplicated: Secondary | ICD-10-CM | POA: Diagnosis not present

## 2014-02-07 DIAGNOSIS — H269 Unspecified cataract: Secondary | ICD-10-CM | POA: Diagnosis not present

## 2014-02-07 DIAGNOSIS — H25049 Posterior subcapsular polar age-related cataract, unspecified eye: Secondary | ICD-10-CM | POA: Diagnosis not present

## 2014-02-07 DIAGNOSIS — F411 Generalized anxiety disorder: Secondary | ICD-10-CM | POA: Diagnosis not present

## 2014-02-07 DIAGNOSIS — M129 Arthropathy, unspecified: Secondary | ICD-10-CM | POA: Diagnosis not present

## 2014-02-07 DIAGNOSIS — H538 Other visual disturbances: Secondary | ICD-10-CM | POA: Diagnosis not present

## 2014-02-07 DIAGNOSIS — Z79899 Other long term (current) drug therapy: Secondary | ICD-10-CM | POA: Diagnosis not present

## 2014-02-07 DIAGNOSIS — H5210 Myopia, unspecified eye: Secondary | ICD-10-CM | POA: Diagnosis not present

## 2014-02-07 DIAGNOSIS — K219 Gastro-esophageal reflux disease without esophagitis: Secondary | ICD-10-CM | POA: Diagnosis not present

## 2014-02-07 DIAGNOSIS — H2510 Age-related nuclear cataract, unspecified eye: Secondary | ICD-10-CM | POA: Diagnosis not present

## 2014-03-01 NOTE — ED Provider Notes (Signed)
Order(s) created erroneously. Erroneous order ID: [id number] Order moved by: Verita Lamb Order move date/time: 03/01/2014  4:10 PM Source Patient:    [id number] Source Contact: 02/08/2014 Destination Patient:   [id number] Destination Contact: 02/02/2013

## 2014-04-19 ENCOUNTER — Ambulatory Visit (INDEPENDENT_AMBULATORY_CARE_PROVIDER_SITE_OTHER): Payer: Medicare Other

## 2014-04-19 ENCOUNTER — Ambulatory Visit (INDEPENDENT_AMBULATORY_CARE_PROVIDER_SITE_OTHER): Payer: Medicare Other | Admitting: Family Medicine

## 2014-04-19 ENCOUNTER — Encounter: Payer: Self-pay | Admitting: Family Medicine

## 2014-04-19 VITALS — BP 109/58 | HR 73 | Temp 99.2°F | Ht 67.0 in | Wt 127.0 lb

## 2014-04-19 DIAGNOSIS — F329 Major depressive disorder, single episode, unspecified: Secondary | ICD-10-CM

## 2014-04-19 DIAGNOSIS — F17200 Nicotine dependence, unspecified, uncomplicated: Secondary | ICD-10-CM | POA: Diagnosis not present

## 2014-04-19 DIAGNOSIS — E559 Vitamin D deficiency, unspecified: Secondary | ICD-10-CM | POA: Diagnosis not present

## 2014-04-19 DIAGNOSIS — F3289 Other specified depressive episodes: Secondary | ICD-10-CM

## 2014-04-19 DIAGNOSIS — E785 Hyperlipidemia, unspecified: Secondary | ICD-10-CM | POA: Diagnosis not present

## 2014-04-19 DIAGNOSIS — F32A Depression, unspecified: Secondary | ICD-10-CM

## 2014-04-19 DIAGNOSIS — Z1211 Encounter for screening for malignant neoplasm of colon: Secondary | ICD-10-CM

## 2014-04-19 DIAGNOSIS — K219 Gastro-esophageal reflux disease without esophagitis: Secondary | ICD-10-CM | POA: Diagnosis not present

## 2014-04-19 LAB — POCT CBC
Granulocyte percent: 80.4 %G — AB (ref 37–80)
HCT, POC: 43.6 % (ref 37.7–47.9)
Hemoglobin: 13.9 g/dL (ref 12.2–16.2)
Lymph, poc: 1.3 (ref 0.6–3.4)
MCH, POC: 29.6 pg (ref 27–31.2)
MCHC: 31.8 g/dL (ref 31.8–35.4)
MCV: 92.9 fL (ref 80–97)
MPV: 8.9 fL (ref 0–99.8)
PLATELET COUNT, POC: 173 10*3/uL (ref 142–424)
POC Granulocyte: 5.9 (ref 2–6.9)
POC LYMPH PERCENT: 17.8 %L (ref 10–50)
RBC: 4.7 M/uL (ref 4.04–5.48)
RDW, POC: 13.1 %
WBC: 7.3 10*3/uL (ref 4.6–10.2)

## 2014-04-19 MED ORDER — TRAMADOL HCL 50 MG PO TABS: ORAL_TABLET | ORAL | Status: DC

## 2014-04-19 NOTE — Progress Notes (Signed)
Subjective:    Patient ID: Misty [name], female    DOB: 1941/10/17, 73 y.o.   MRN: [id number]  HPI Pt here for follow up and management of chronic medical problems. The patient has recent back surgery. And she is doing well from this. She is due to get lab work today. She is also due to get a Pap smear and a mammogram. This would be scheduled for her before she leaves the office. She will get a chest x-ray today. During the initial visit) sheet reflects that she is somewhat depressed and was tearful and a lot of this she indicated that I did not know about. Call for her opportunity to talk to someone and she declined. She is due to get a colonoscopy also. This will be scheduled for her. She has also had recent cataract surgery on the right eye she still recovering from this.        Patient Active Problem List   Diagnosis Date Noted  . Hyperlipidemia 08/19/2013  . Fibrocystic breast disease 08/19/2013  . History of migraine headaches 08/19/2013  . GERD (gastroesophageal reflux disease) 04/07/2013  . Osteoporosis, postmenopausal 04/07/2013   Outpatient Encounter Prescriptions as of 04/19/2014  Medication Sig  . acetaminophen (TYLENOL) 500 MG tablet Take 500 mg by mouth every 6 (six) hours as needed.  . ALPRAZolam (XANAX) 0.5 MG tablet Take 1 tablet (0.5 mg total) by mouth 3 (three) times daily as needed for anxiety.  . cholecalciferol (VITAMIN D) 1000 UNITS tablet Take 1,000 Units by mouth daily. 2 tablets daily except 4 on Sat and Sun  . Cyanocobalamin (VITAMIN B 12 PO) Take 1 tablet by mouth daily.  . Multiple Vitamins-Minerals (CENTRUM SILVER PO) Take 1 tablet by mouth daily.  . ranitidine (ZANTAC) 150 MG tablet Take 150 mg by mouth at bedtime.  . [DISCONTINUED] traMADol (ULTRAM) 50 MG tablet Take 1 tablet (50 mg total) by mouth 2 (two) times daily.    Review of Systems  Constitutional: Positive for fatigue and unexpected weight change (loss).  HENT: Negative.   Eyes:  Negative.   Respiratory: Negative.   Cardiovascular: Negative.   Gastrointestinal: Negative.   Endocrine: Negative.   Genitourinary: Negative.   Musculoskeletal: Negative.   Skin: Negative.   Allergic/Immunologic: Negative.   Neurological: Negative.   Hematological: Negative.   Psychiatric/Behavioral: Negative.        Objective:   Physical Exam  Nursing note and vitals reviewed. Constitutional: She is oriented to person, place, and time. No distress.  Somewhat in and slender  HENT:  [name] Normocephalic and atraumatic.  Nose: Nose normal.  Mouth/Throat: Oropharynx is clear and moist.  Ears cerumen  Eyes: Conjunctivae and EOM are normal. Pupils are equal, round, and reactive to light. Right eye exhibits no discharge. Left eye exhibits no discharge. No scleral icterus.  Recent cataract surgery right  Neck: Normal range of motion. Neck supple. No thyromegaly present.  Cardiovascular: Normal rate, regular rhythm, normal heart sounds and intact distal pulses.  Exam reveals no gallop and no friction rub.   No murmur heard. At 72 per minute  Pulmonary/Chest: Effort normal and breath sounds normal. No respiratory distress. She has no wheezes. She has no rales. She exhibits no tenderness.  Abdominal: Soft. Bowel sounds are normal. She exhibits no mass. There is no tenderness. There is no rebound and no guarding.  Musculoskeletal: Normal range of motion. She exhibits no edema and no tenderness.  Lymphadenopathy:    She has no cervical adenopathy.  Neurological: She is alert and oriented to person, place, and time. She has normal reflexes. No cranial nerve deficit.  Skin: Skin is warm and dry. No rash noted.  Psychiatric: Her behavior is normal. Judgment and thought content normal.  Somewhat tearful in talking about her mother or stepmother and father and their passing   BP 109/58  Pulse 73  Temp(Src) 99.2 F (37.3 C) (Oral)  Ht 5' 7"  (1.702 m)  Wt 127 lb (57.607 kg)  BMI 19.89  kg/m2  [facility] reading (PRIMARY) by  Dr. Brunilda [name] x-ray-no active disease                                        Assessment & Plan:  1. GERD (gastroesophageal reflux disease) - POCT CBC  2. Hyperlipidemia - POCT CBC - BMP8+EGFR - Hepatic function panel - NMR, lipoprofile - DG Chest 2 View; Future  3. Vitamin D deficiency - Vit D  25 hydroxy (rtn osteoporosis monitoring)  4. Special screening for malignant neoplasms, colon - Ambulatory referral to Gastroenterology  5. Depression   Meds ordered this encounter  Medications  . acetaminophen (TYLENOL) 500 MG tablet    Sig: Take 500 mg by mouth every 6 (six) hours as needed.  . traMADol (ULTRAM) 50 MG tablet    Sig: Take 1/2 to 1 whole tab daily PRN    Dispense:  30 tablet    Refill:  0   Patient Instructions                       Medicare Annual Wellness Visit  [facility] and the medical providers at [facility] strive to bring you the best medical care.  In doing so we not only want to address your current medical conditions and concerns but also to detect new conditions early and prevent illness, disease and health-related problems.    Medicare offers a yearly Wellness Visit which allows our clinical staff to assess your need for preventative services including immunizations, lifestyle education, counseling to decrease risk of preventable diseases and screening for fall risk and other medical concerns.    This visit is provided free of charge (no copay) for all Medicare recipients. The clinical pharmacists at [facility] have begun to conduct these Wellness Visits which will also include a thorough review of all your medications.    As you primary medical provider recommend that you make an appointment for your Annual Wellness Visit if you have not done so already this year.  You may set up this appointment before you leave today or you may call back ([phone]) and schedule an  appointment.  Please make sure when you call that you mention that you are scheduling your Annual Wellness Visit with the clinical pharmacist so that the appointment may be made for the proper length of time.      Continue current medications. Continue good therapeutic lifestyle changes which include good diet and exercise. Fall precautions discussed with patient. If an FOBT was given today- please return it to our front desk. If you are over 70 years old - you may need Prevnar 49 or the adult Pneumonia vaccine. Call us if you would want to speak to a counselor Try and eat and drink better     Arrie Senate MD

## 2014-04-19 NOTE — Patient Instructions (Addendum)
Medicare Annual Wellness Visit  [facility] and the medical providers at [facility] strive to bring you the best medical care.  In doing so we not only want to address your current medical conditions and concerns but also to detect new conditions early and prevent illness, disease and health-related problems.    Medicare offers a yearly Wellness Visit which allows our clinical staff to assess your need for preventative services including immunizations, lifestyle education, counseling to decrease risk of preventable diseases and screening for fall risk and other medical concerns.    This visit is provided free of charge (no copay) for all Medicare recipients. The clinical pharmacists at [facility] have begun to conduct these Wellness Visits which will also include a thorough review of all your medications.    As you primary medical provider recommend that you make an appointment for your Annual Wellness Visit if you have not done so already this year.  You may set up this appointment before you leave today or you may call back ([phone]) and schedule an appointment.  Please make sure when you call that you mention that you are scheduling your Annual Wellness Visit with the clinical pharmacist so that the appointment may be made for the proper length of time.      Continue current medications. Continue good therapeutic lifestyle changes which include good diet and exercise. Fall precautions discussed with patient. If an FOBT was given today- please return it to our front desk. If you are over 49 years old - you may need Prevnar 13 or the adult Pneumonia vaccine. Call us if you would want to speak to a counselor Try and eat and drink better Do not forget to get your mammogram and Pap smear We will arrange for you to see the gastroenterologist for a colonoscopy

## 2014-04-20 ENCOUNTER — Telehealth: Payer: Self-pay

## 2014-04-20 LAB — BMP8+EGFR
BUN / CREAT RATIO: 18 (ref 11–26)
BUN: 15 mg/dL (ref 8–27)
CO2: 26 mmol/L (ref 18–29)
CREATININE: 0.83 mg/dL (ref 0.57–1.00)
Calcium: 9.4 mg/dL (ref 8.7–10.3)
Chloride: 102 mmol/L (ref 97–108)
GFR calc Af Amer: 81 mL/min/{1.73_m2} (ref >59)
GFR, EST NON AFRICAN AMERICAN: 70 mL/min/{1.73_m2} (ref >59)
Glucose: 96 mg/dL (ref 65–99)
Potassium: 4.3 mmol/L (ref 3.5–5.2)
SODIUM: 143 mmol/L (ref 134–144)

## 2014-04-20 LAB — NMR, LIPOPROFILE
Cholesterol: 143 mg/dL (ref 100–199)
HDL Cholesterol by NMR: 45 mg/dL (ref >39)
HDL Particle Number: 31 umol/L (ref >=30.5)
LDL Particle Number: 1069 nmol/L — ABNORMAL HIGH (ref <1000)
LDL SIZE: 20.6 nm (ref >20.5)
LDLC SERPL CALC-MCNC: 79 mg/dL (ref 0–99)
LP-IR Score: 32 (ref <=45)
SMALL LDL PARTICLE NUMBER: 477 nmol/L (ref <=527)
Triglycerides by NMR: 95 mg/dL (ref 0–149)

## 2014-04-20 LAB — HEPATIC FUNCTION PANEL
ALT: 14 IU/L (ref 0–32)
AST: 14 IU/L (ref 0–40)
Albumin: 4.4 g/dL (ref 3.5–4.8)
Alkaline Phosphatase: 108 IU/L (ref 39–117)
BILIRUBIN DIRECT: 0.11 mg/dL (ref 0.00–0.40)
BILIRUBIN TOTAL: 0.3 mg/dL (ref 0.0–1.2)
Total Protein: 6.4 g/dL (ref 6.0–8.5)

## 2014-04-20 LAB — VITAMIN D 25 HYDROXY (VIT D DEFICIENCY, FRACTURES): VIT D 25 HYDROXY: 43 ng/mL (ref 30.0–100.0)

## 2014-04-20 NOTE — Telephone Encounter (Signed)
Message copied by Roselee Culver on Wed Apr 20, 2014  9:05 AM ------      Message from: Ernestina Penna      Created: Tue Apr 19, 2014 12:34 PM       As per radiology report ------

## 2014-04-20 NOTE — Telephone Encounter (Signed)
Pt aware of CXR results.

## 2014-06-14 ENCOUNTER — Other Ambulatory Visit: Payer: Medicare Other | Admitting: Nurse Practitioner

## 2014-06-15 ENCOUNTER — Telehealth: Payer: Self-pay | Admitting: Family Medicine

## 2014-06-15 ENCOUNTER — Encounter: Payer: Self-pay | Admitting: Family Medicine

## 2014-06-15 ENCOUNTER — Ambulatory Visit (INDEPENDENT_AMBULATORY_CARE_PROVIDER_SITE_OTHER): Payer: Medicare Other

## 2014-06-15 ENCOUNTER — Ambulatory Visit (INDEPENDENT_AMBULATORY_CARE_PROVIDER_SITE_OTHER): Payer: Medicare Other | Admitting: Family Medicine

## 2014-06-15 VITALS — BP 107/65 | HR 91 | Temp 99.1°F | Ht 67.0 in | Wt 126.0 lb

## 2014-06-15 DIAGNOSIS — M546 Pain in thoracic spine: Secondary | ICD-10-CM

## 2014-06-15 DIAGNOSIS — M549 Dorsalgia, unspecified: Secondary | ICD-10-CM | POA: Diagnosis not present

## 2014-06-15 MED ORDER — HYDROCODONE-ACETAMINOPHEN 5-325 MG PO TABS: 1.0000 | ORAL_TABLET | Freq: Four times a day (QID) | ORAL | Status: DC | PRN

## 2014-06-15 NOTE — Patient Instructions (Addendum)
Take medication as directed Use warm wet compress 20 minutes 4 times daily

## 2014-06-15 NOTE — Telephone Encounter (Signed)
Appt given for today per patients request 

## 2014-06-15 NOTE — Progress Notes (Signed)
Subjective:    Patient ID: Misty [name], female    DOB: 04/16/1941, 73 y.o.   MRN: [id number]  HPI Patient here today for back pain. She states is hurts from her shoulders down to her lumbar area. She had back surgery in July of 2014. She says it feels like it did when she had gotten surgery. She describes she does indicate that she went to the grocery store as she does many times a couple days before the pain started 3-4 days ago and got groceries.no injury.        Patient Active Problem List   Diagnosis Date Noted  . Hyperlipidemia 08/19/2013  . Fibrocystic breast disease 08/19/2013  . History of migraine headaches 08/19/2013  . GERD (gastroesophageal reflux disease) 04/07/2013  . Osteoporosis, postmenopausal 04/07/2013   Outpatient Encounter Prescriptions as of 06/15/2014  Medication Sig  . acetaminophen (TYLENOL) 500 MG tablet Take 500 mg by mouth every 6 (six) hours as needed.  . ALPRAZolam (XANAX) 0.5 MG tablet Take 1 tablet (0.5 mg total) by mouth 3 (three) times daily as needed for anxiety.  . cholecalciferol (VITAMIN D) 1000 UNITS tablet Take 1,000 Units by mouth daily. 2 tablets daily except 4 on Sat and Sun  . Cyanocobalamin (VITAMIN B 12 PO) Take 1 tablet by mouth daily.  . Multiple Vitamins-Minerals (CENTRUM SILVER PO) Take 1 tablet by mouth daily.  . ranitidine (ZANTAC) 150 MG tablet Take 150 mg by mouth at bedtime.  . [DISCONTINUED] traMADol (ULTRAM) 50 MG tablet Take 1/2 to 1 whole tab daily PRN     Review of Systems  Constitutional: Negative.   HENT: Negative.   Eyes: Negative.   Respiratory: Negative.   Cardiovascular: Negative.   Gastrointestinal: Negative.   Endocrine: Negative.   Genitourinary: Negative.   Musculoskeletal: Positive for back pain.  Skin: Negative.   Allergic/Immunologic: Negative.   Neurological: Negative.   Hematological: Negative.   Psychiatric/Behavioral: Negative.        Objective:   Physical Exam  Nursing note and  vitals reviewed. Constitutional: She is oriented to person, place, and time. She appears distressed.  Thin, frail and obviously in pain with her back  HENT:  [name] Normocephalic.  Eyes: Conjunctivae and EOM are normal. Pupils are equal, round, and reactive to light. Right eye exhibits no discharge. Left eye exhibits no discharge. No scleral icterus.  Neck: Normal range of motion.  Cardiovascular: Normal rate, regular rhythm and normal heart sounds.   No murmur heard. Pulmonary/Chest: Effort normal. She has no wheezes. She has no rales. She exhibits no tenderness.  Breath sounds are slightly diminished but there is no rales or congestion with cough  Abdominal: Soft. She exhibits no mass. There is no tenderness. There is no rebound and no guarding.  Musculoskeletal:  Range of motion is limited secondary to back pain  Neurological: She is alert and oriented to person, place, and time. She displays normal reflexes.  Skin: Skin is warm and dry. No rash noted.  Psychiatric: Her behavior is normal. Judgment and thought content normal.  Distressed with pain    BP 107/65  Pulse 91  Temp(Src) 99.1 F (37.3 C) (Oral)  Ht 5' 7"  (1.702 m)  Wt 126 lb (57.153 kg)  BMI 19.73 kg/m2  [facility] reading (PRIMARY) by  Dr. Glendon Axe spine--T11 may be diminished in height compared to lateral view on chest x-ray in June of this year  Assessment & Plan:  1. Midline thoracic back pain - DG Lumbar Spine 2-3 Views; Future - DG Thoracic Spine 2 View; Future  2. Bilateral thoracic back pain - DG Lumbar Spine 2-3 Views; Future - DG Thoracic Spine 2 View; Future  Patient Instructions  Take medication as directed Use warm wet compress 20 minutes 4 times daily    Arrie Senate MD

## 2014-06-16 ENCOUNTER — Telehealth: Payer: Self-pay

## 2014-06-16 NOTE — Telephone Encounter (Signed)
Pt aware of Lumbar  And Thoracic spine x-rays. She is not available until after August 12th. 

## 2014-06-16 NOTE — Telephone Encounter (Signed)
Message copied by Roselee Culver on Thu Jun 16, 2014  8:35 AM ------      Message from: Ernestina Penna      Created: Wed Jun 15, 2014  6:02 PM       As per radiology report----there is a compression fracture of T. 11 as discussed with the patient today. Please call and make a referral to Dr. Newell Coral who did her recent surgery to see if he thinks a vertebroplasty would be appropriate. Let patient know the results of this x-ray report ------

## 2014-06-16 NOTE — Telephone Encounter (Signed)
Pt aware of Lumbar  And Thoracic spine x-rays. She is not available until after August 12th.

## 2014-06-16 NOTE — Telephone Encounter (Signed)
Message copied by Roselee Culver on Thu Jun 16, 2014  8:34 AM ------      Message from: Ernestina Penna      Created: Wed Jun 15, 2014  6:01 PM       As per radiology report ------

## 2014-06-17 ENCOUNTER — Telehealth: Payer: Self-pay | Admitting: Family Medicine

## 2014-06-20 ENCOUNTER — Other Ambulatory Visit: Payer: Self-pay | Admitting: *Deleted

## 2014-06-20 ENCOUNTER — Telehealth: Payer: Self-pay

## 2014-06-20 MED ORDER — HYDROCODONE-ACETAMINOPHEN 5-325 MG PO TABS: 1.0000 | ORAL_TABLET | Freq: Four times a day (QID) | ORAL | Status: DC | PRN

## 2014-06-20 NOTE — Telephone Encounter (Signed)
Please make sure that this referral gets done on an urgent basis due to the amount of pain that she is experiencing

## 2014-06-20 NOTE — Telephone Encounter (Signed)
carlon was working on this today

## 2014-06-20 NOTE — Telephone Encounter (Signed)
Patient calling about referral to Dr Sherwood Gambler  ( No referral in [place])

## 2014-06-20 NOTE — Telephone Encounter (Signed)
Wont have enough meds to last another day or so Refill to be signed my DWM

## 2014-06-21 ENCOUNTER — Other Ambulatory Visit: Payer: Self-pay

## 2014-06-21 DIAGNOSIS — M545 Low back pain: Secondary | ICD-10-CM

## 2014-06-23 ENCOUNTER — Telehealth: Payer: Self-pay | Admitting: Family Medicine

## 2014-06-23 MED ORDER — HYDROCODONE-ACETAMINOPHEN 5-325 MG PO TABS: 1.0000 | ORAL_TABLET | Freq: Four times a day (QID) | ORAL | Status: DC | PRN

## 2014-06-23 NOTE — Telephone Encounter (Signed)
dwm to sign- RX UP FRONT!!  Pt not aware - NA at phone # given.

## 2014-06-23 NOTE — Telephone Encounter (Signed)
This is okay to refill

## 2014-06-30 ENCOUNTER — Telehealth: Payer: Self-pay | Admitting: Family Medicine

## 2014-07-01 MED ORDER — HYDROCODONE-ACETAMINOPHEN 5-325 MG PO TABS: 1.0000 | ORAL_TABLET | Freq: Four times a day (QID) | ORAL | Status: DC | PRN

## 2014-07-01 NOTE — Telephone Encounter (Signed)
Patient aware.

## 2014-07-01 NOTE — Telephone Encounter (Signed)
Please review for Dr.moore

## 2014-07-01 NOTE — Telephone Encounter (Signed)
This is okay to refill--see if one of the other providers will sign

## 2014-07-01 NOTE — Telephone Encounter (Signed)
rx ready for pickup 

## 2014-07-04 ENCOUNTER — Other Ambulatory Visit: Payer: Self-pay | Admitting: Neurosurgery

## 2014-07-04 DIAGNOSIS — M546 Pain in thoracic spine: Secondary | ICD-10-CM | POA: Diagnosis not present

## 2014-07-04 DIAGNOSIS — M545 Low back pain, unspecified: Secondary | ICD-10-CM | POA: Diagnosis not present

## 2014-07-04 DIAGNOSIS — M810 Age-related osteoporosis without current pathological fracture: Secondary | ICD-10-CM | POA: Diagnosis not present

## 2014-07-04 DIAGNOSIS — S22009A Unspecified fracture of unspecified thoracic vertebra, initial encounter for closed fracture: Secondary | ICD-10-CM | POA: Diagnosis not present

## 2014-07-05 ENCOUNTER — Other Ambulatory Visit: Payer: Self-pay | Admitting: Family Medicine

## 2014-07-05 ENCOUNTER — Encounter (HOSPITAL_COMMUNITY): Payer: Self-pay | Admitting: Pharmacy Technician

## 2014-07-05 NOTE — Telephone Encounter (Signed)
FYI- pt is having surg Friday morning.

## 2014-07-06 ENCOUNTER — Encounter (HOSPITAL_COMMUNITY): Payer: Self-pay

## 2014-07-06 ENCOUNTER — Encounter (HOSPITAL_COMMUNITY)
Admission: RE | Admit: 2014-07-06 | Discharge: 2014-07-06 | Disposition: A | Discharge status: Home / Self Care | Payer: Medicare Other | Source: Ambulatory Visit | Attending: Neurosurgery | Admitting: Neurosurgery

## 2014-07-06 DIAGNOSIS — K5900 Constipation, unspecified: Secondary | ICD-10-CM | POA: Diagnosis not present

## 2014-07-06 DIAGNOSIS — Z888 Allergy status to other drugs, medicaments and biological substances status: Secondary | ICD-10-CM | POA: Diagnosis not present

## 2014-07-06 DIAGNOSIS — J4489 Other specified chronic obstructive pulmonary disease: Secondary | ICD-10-CM | POA: Diagnosis not present

## 2014-07-06 DIAGNOSIS — K219 Gastro-esophageal reflux disease without esophagitis: Secondary | ICD-10-CM | POA: Diagnosis not present

## 2014-07-06 DIAGNOSIS — Z886 Allergy status to analgesic agent status: Secondary | ICD-10-CM | POA: Diagnosis not present

## 2014-07-06 DIAGNOSIS — Z885 Allergy status to narcotic agent status: Secondary | ICD-10-CM | POA: Diagnosis not present

## 2014-07-06 DIAGNOSIS — F329 Major depressive disorder, single episode, unspecified: Secondary | ICD-10-CM | POA: Diagnosis not present

## 2014-07-06 DIAGNOSIS — R351 Nocturia: Secondary | ICD-10-CM | POA: Diagnosis not present

## 2014-07-06 DIAGNOSIS — N6019 Diffuse cystic mastopathy of unspecified breast: Secondary | ICD-10-CM | POA: Diagnosis not present

## 2014-07-06 DIAGNOSIS — Z9849 Cataract extraction status, unspecified eye: Secondary | ICD-10-CM | POA: Diagnosis not present

## 2014-07-06 DIAGNOSIS — F3289 Other specified depressive episodes: Secondary | ICD-10-CM | POA: Diagnosis not present

## 2014-07-06 DIAGNOSIS — G43909 Migraine, unspecified, not intractable, without status migrainosus: Secondary | ICD-10-CM | POA: Diagnosis not present

## 2014-07-06 DIAGNOSIS — M8448XA Pathological fracture, other site, initial encounter for fracture: Secondary | ICD-10-CM | POA: Diagnosis not present

## 2014-07-06 DIAGNOSIS — Z881 Allergy status to other antibiotic agents status: Secondary | ICD-10-CM | POA: Diagnosis not present

## 2014-07-06 DIAGNOSIS — F411 Generalized anxiety disorder: Secondary | ICD-10-CM | POA: Diagnosis not present

## 2014-07-06 DIAGNOSIS — Z79899 Other long term (current) drug therapy: Secondary | ICD-10-CM | POA: Diagnosis not present

## 2014-07-06 DIAGNOSIS — F17200 Nicotine dependence, unspecified, uncomplicated: Secondary | ICD-10-CM | POA: Diagnosis not present

## 2014-07-06 DIAGNOSIS — Z90710 Acquired absence of both cervix and uterus: Secondary | ICD-10-CM | POA: Diagnosis not present

## 2014-07-06 DIAGNOSIS — E785 Hyperlipidemia, unspecified: Secondary | ICD-10-CM | POA: Diagnosis not present

## 2014-07-06 DIAGNOSIS — R3915 Urgency of urination: Secondary | ICD-10-CM | POA: Diagnosis not present

## 2014-07-06 DIAGNOSIS — M810 Age-related osteoporosis without current pathological fracture: Secondary | ICD-10-CM | POA: Diagnosis not present

## 2014-07-06 DIAGNOSIS — R350 Frequency of micturition: Secondary | ICD-10-CM | POA: Diagnosis not present

## 2014-07-06 DIAGNOSIS — J449 Chronic obstructive pulmonary disease, unspecified: Secondary | ICD-10-CM | POA: Diagnosis not present

## 2014-07-06 DIAGNOSIS — G4700 Insomnia, unspecified: Secondary | ICD-10-CM | POA: Diagnosis not present

## 2014-07-06 LAB — BASIC METABOLIC PANEL
Anion gap: 11 (ref 5–15)
BUN: 10 mg/dL (ref 6–23)
CHLORIDE: 103 meq/L (ref 96–112)
CO2: 29 meq/L (ref 19–32)
Calcium: 9 mg/dL (ref 8.4–10.5)
Creatinine, Ser: 0.83 mg/dL (ref 0.50–1.10)
GFR calc Af Amer: 79 mL/min — ABNORMAL LOW (ref >90)
GFR calc non Af Amer: 68 mL/min — ABNORMAL LOW (ref >90)
Glucose, Bld: 102 mg/dL — ABNORMAL HIGH (ref 70–99)
Potassium: 3.9 mEq/L (ref 3.7–5.3)
Sodium: 143 mEq/L (ref 137–147)

## 2014-07-06 LAB — CBC
HCT: 39.5 % (ref 36.0–46.0)
HEMOGLOBIN: 12.8 g/dL (ref 12.0–15.0)
MCH: 30.8 pg (ref 26.0–34.0)
MCHC: 32.4 g/dL (ref 30.0–36.0)
MCV: 95 fL (ref 78.0–100.0)
Platelets: 205 10*3/uL (ref 150–400)
RBC: 4.16 MIL/uL (ref 3.87–5.11)
RDW: 13.4 % (ref 11.5–15.5)
WBC: 6.6 10*3/uL (ref 4.0–10.5)

## 2014-07-06 NOTE — Pre-Procedure Instructions (Signed)
Misty [name]  07/06/2014   Your procedure is scheduled on:  Friday August 21 th at 0730 AM  Report to [facility]mitting at 0530 AM.  Call this number if you have problems the morning of surgery: [phone]   Remember:   Do not eat food or drink liquids after midnight Thursday   Take these medicines the morning of surgery with A SIP OF WATER: Alprazolam(Xanax), Hydrocodone-acetaminophen if needed for pain, and Ranitidine(Zantac)   Do not wear jewelry, make-up or nail polish.  Do not wear lotions, powders, or perfumes. You may wear deodorant.  Do not shave 48 hours prior to surgery.   Do not bring valuables to the hospital.  [facility] is not responsible   for any belongings or valuables.               Contacts, dentures or bridgework may not be worn into surgery.  Leave suitcase in the car. After surgery it may be brought to your room.  For patients admitted to the hospital, discharge time is determined by your  treatment team.               Patients discharged the day of surgery will not be allowed to drive home.    Special Instructions: [facility] - Preparing for Surgery  Before surgery, you can play an important role.  Because skin is not sterile, your skin needs to be as free of germs as possible.  You can reduce the number of germs on you skin by washing with CHG (chlorahexidine gluconate) soap before surgery.  CHG is an antiseptic cleaner which kills germs and bonds with the skin to continue killing germs even after washing.  Please DO NOT use if you have an allergy to CHG or antibacterial soaps.  If your skin becomes reddened/irritated stop using the CHG and inform your nurse when you arrive at Short Stay.  Do not shave (including legs and underarms) for at least 48 hours prior to the first CHG shower.  You may shave your face.  Please follow these instructions carefully:   1.  Shower with CHG Soap the night before surgery and the                                 morning of Surgery.  2.  If you choose to wash your hair, wash your hair first as usual with your       normal shampoo.  3.  After you shampoo, rinse your hair and body thoroughly to remove the                      Shampoo.  4.  Use CHG as you would any other liquid soap.  You can apply chg directly       to the skin and wash gently with scrungie or a clean washcloth.  5.  Apply the CHG Soap to your body ONLY FROM THE NECK DOWN.        Do not use on open wounds or open sores.  Avoid contact with your eyes,       ears, mouth and genitals (private parts).  Wash genitals (private parts)       with your normal soap.  6.  Wash thoroughly, paying special attention to the area where your surgery        will be performed.  7.  Thoroughly  rinse your body with warm water from the neck down.  8.  DO NOT shower/wash with your normal soap after using and rinsing off       the CHG Soap.  9.  Pat yourself dry with a clean towel.            10.  Wear clean pajamas.            11.  Place clean sheets on your bed the night of your first shower and do not        sleep with pets.  Day of Surgery  Do not apply any lotions/deoderants the morning of surgery.  Please wear clean clothes to the hospital/surgery center.      Please read over the following fact sheets that you were given: Pain Booklet, Coughing and Deep Breathing and Surgical Site Infection Prevention

## 2014-07-07 MED ORDER — CEFAZOLIN SODIUM-DEXTROSE 2-3 GM-% IV SOLR
2.0000 g | INTRAVENOUS | Status: AC
  Administered 2014-07-08: 2 g via INTRAVENOUS
  Filled 2014-07-07: qty 50

## 2014-07-08 ENCOUNTER — Encounter (HOSPITAL_COMMUNITY): Payer: Self-pay | Admitting: *Deleted

## 2014-07-08 ENCOUNTER — Observation Stay (HOSPITAL_COMMUNITY): Payer: Medicare Other

## 2014-07-08 ENCOUNTER — Encounter (HOSPITAL_COMMUNITY)
Admission: RE | Disposition: A | Discharge status: Home / Self Care | Payer: Self-pay | Source: Ambulatory Visit | Attending: Neurosurgery

## 2014-07-08 ENCOUNTER — Ambulatory Visit (HOSPITAL_COMMUNITY)
Admission: RE | Admit: 2014-07-08 | Discharge: 2014-07-08 | Disposition: A | Discharge status: Home / Self Care | Payer: Medicare Other | Source: Ambulatory Visit | Attending: Neurosurgery | Admitting: Neurosurgery

## 2014-07-08 ENCOUNTER — Inpatient Hospital Stay (HOSPITAL_COMMUNITY): Payer: Medicare Other | Admitting: Anesthesiology

## 2014-07-08 ENCOUNTER — Encounter (HOSPITAL_COMMUNITY): Payer: Medicare Other | Admitting: Anesthesiology

## 2014-07-08 DIAGNOSIS — M810 Age-related osteoporosis without current pathological fracture: Secondary | ICD-10-CM | POA: Insufficient documentation

## 2014-07-08 DIAGNOSIS — Z881 Allergy status to other antibiotic agents status: Secondary | ICD-10-CM | POA: Insufficient documentation

## 2014-07-08 DIAGNOSIS — G43909 Migraine, unspecified, not intractable, without status migrainosus: Secondary | ICD-10-CM | POA: Insufficient documentation

## 2014-07-08 DIAGNOSIS — Z885 Allergy status to narcotic agent status: Secondary | ICD-10-CM | POA: Insufficient documentation

## 2014-07-08 DIAGNOSIS — E785 Hyperlipidemia, unspecified: Secondary | ICD-10-CM | POA: Diagnosis not present

## 2014-07-08 DIAGNOSIS — Z9849 Cataract extraction status, unspecified eye: Secondary | ICD-10-CM | POA: Insufficient documentation

## 2014-07-08 DIAGNOSIS — M519 Unspecified thoracic, thoracolumbar and lumbosacral intervertebral disc disorder: Secondary | ICD-10-CM | POA: Diagnosis not present

## 2014-07-08 DIAGNOSIS — M546 Pain in thoracic spine: Secondary | ICD-10-CM | POA: Diagnosis not present

## 2014-07-08 DIAGNOSIS — R350 Frequency of micturition: Secondary | ICD-10-CM | POA: Insufficient documentation

## 2014-07-08 DIAGNOSIS — K5900 Constipation, unspecified: Secondary | ICD-10-CM | POA: Insufficient documentation

## 2014-07-08 DIAGNOSIS — F329 Major depressive disorder, single episode, unspecified: Secondary | ICD-10-CM | POA: Insufficient documentation

## 2014-07-08 DIAGNOSIS — J449 Chronic obstructive pulmonary disease, unspecified: Secondary | ICD-10-CM | POA: Insufficient documentation

## 2014-07-08 DIAGNOSIS — N6019 Diffuse cystic mastopathy of unspecified breast: Secondary | ICD-10-CM | POA: Diagnosis not present

## 2014-07-08 DIAGNOSIS — S22009A Unspecified fracture of unspecified thoracic vertebra, initial encounter for closed fracture: Secondary | ICD-10-CM | POA: Diagnosis not present

## 2014-07-08 DIAGNOSIS — G4700 Insomnia, unspecified: Secondary | ICD-10-CM | POA: Insufficient documentation

## 2014-07-08 DIAGNOSIS — Z886 Allergy status to analgesic agent status: Secondary | ICD-10-CM | POA: Insufficient documentation

## 2014-07-08 DIAGNOSIS — M8448XA Pathological fracture, other site, initial encounter for fracture: Secondary | ICD-10-CM | POA: Diagnosis not present

## 2014-07-08 DIAGNOSIS — Z888 Allergy status to other drugs, medicaments and biological substances status: Secondary | ICD-10-CM | POA: Insufficient documentation

## 2014-07-08 DIAGNOSIS — J4489 Other specified chronic obstructive pulmonary disease: Secondary | ICD-10-CM | POA: Insufficient documentation

## 2014-07-08 DIAGNOSIS — R351 Nocturia: Secondary | ICD-10-CM | POA: Insufficient documentation

## 2014-07-08 DIAGNOSIS — M545 Low back pain, unspecified: Secondary | ICD-10-CM | POA: Diagnosis not present

## 2014-07-08 DIAGNOSIS — Z79899 Other long term (current) drug therapy: Secondary | ICD-10-CM | POA: Insufficient documentation

## 2014-07-08 DIAGNOSIS — F17200 Nicotine dependence, unspecified, uncomplicated: Secondary | ICD-10-CM | POA: Insufficient documentation

## 2014-07-08 DIAGNOSIS — F411 Generalized anxiety disorder: Secondary | ICD-10-CM | POA: Insufficient documentation

## 2014-07-08 DIAGNOSIS — M4854XA Collapsed vertebra, not elsewhere classified, thoracic region, initial encounter for fracture: Secondary | ICD-10-CM | POA: Diagnosis present

## 2014-07-08 DIAGNOSIS — K219 Gastro-esophageal reflux disease without esophagitis: Secondary | ICD-10-CM | POA: Insufficient documentation

## 2014-07-08 DIAGNOSIS — F3289 Other specified depressive episodes: Secondary | ICD-10-CM | POA: Insufficient documentation

## 2014-07-08 DIAGNOSIS — R3915 Urgency of urination: Secondary | ICD-10-CM | POA: Insufficient documentation

## 2014-07-08 DIAGNOSIS — Z90710 Acquired absence of both cervix and uterus: Secondary | ICD-10-CM | POA: Insufficient documentation

## 2014-07-08 HISTORY — DX: Collapsed vertebra, not elsewhere classified, thoracic region, initial encounter for fracture: M48.54XA

## 2014-07-08 HISTORY — PX: KYPHOPLASTY: SHX5884

## 2014-07-08 SURGERY — KYPHOPLASTY
Anesthesia: General | Site: Spine Thoracic

## 2014-07-08 MED ORDER — ACETAMINOPHEN 650 MG RE SUPP
650.0000 mg | RECTAL | Status: DC | PRN
Start: 2014-07-08 — End: 2014-07-08

## 2014-07-08 MED ORDER — OXYCODONE-ACETAMINOPHEN 5-325 MG PO TABS: 1.0000 | ORAL_TABLET | ORAL | Status: DC | PRN

## 2014-07-08 MED ORDER — OXYCODONE HCL 5 MG/5ML PO SOLN: 5.0000 mg | Freq: Once | ORAL | Status: AC | PRN

## 2014-07-08 MED ORDER — FENTANYL CITRATE 0.05 MG/ML IJ SOLN
INTRAMUSCULAR | Status: AC
  Filled 2014-07-08: qty 2

## 2014-07-08 MED ORDER — HYDROCODONE-ACETAMINOPHEN 5-325 MG PO TABS: 0.5000 | ORAL_TABLET | ORAL | Status: DC | PRN

## 2014-07-08 MED ORDER — FENTANYL CITRATE 0.05 MG/ML IJ SOLN
INTRAMUSCULAR | Status: AC
  Filled 2014-07-08: qty 5

## 2014-07-08 MED ORDER — ARTIFICIAL TEARS OP OINT
TOPICAL_OINTMENT | OPHTHALMIC | Status: AC
  Filled 2014-07-08: qty 3.5

## 2014-07-08 MED ORDER — VITAMIN D3 25 MCG (1000 UNIT) PO TABS
2000.0000 [IU] | ORAL_TABLET | ORAL | Status: DC
  Administered 2014-07-08: 2000 [IU] via ORAL
  Filled 2014-07-08: qty 2

## 2014-07-08 MED ORDER — MENTHOL 3 MG MT LOZG: 1.0000 | LOZENGE | OROMUCOSAL | Status: DC | PRN

## 2014-07-08 MED ORDER — PHENOL 1.4 % MT LIQD: 1.0000 | OROMUCOSAL | Status: DC | PRN

## 2014-07-08 MED ORDER — ALUM & MAG HYDROXIDE-SIMETH 200-200-20 MG/5ML PO SUSP: 30.0000 mL | Freq: Four times a day (QID) | ORAL | Status: DC | PRN

## 2014-07-08 MED ORDER — ARTIFICIAL TEARS OP OINT
TOPICAL_OINTMENT | OPHTHALMIC | Status: DC | PRN
  Administered 2014-07-08: 1 via OPHTHALMIC

## 2014-07-08 MED ORDER — SCOPOLAMINE 1 MG/3DAYS TD PT72
MEDICATED_PATCH | TRANSDERMAL | Status: DC | PRN
  Administered 2014-07-08: 1 via TRANSDERMAL

## 2014-07-08 MED ORDER — ONDANSETRON HCL 4 MG/2ML IJ SOLN
INTRAMUSCULAR | Status: DC | PRN
  Administered 2014-07-08: 4 mg via INTRAVENOUS

## 2014-07-08 MED ORDER — FENTANYL CITRATE 0.05 MG/ML IJ SOLN
25.0000 ug | INTRAMUSCULAR | Status: DC | PRN
  Administered 2014-07-08 (×3): 50 ug via INTRAVENOUS

## 2014-07-08 MED ORDER — IOHEXOL 300 MG/ML  SOLN
INTRAMUSCULAR | Status: DC | PRN
  Administered 2014-07-08: 50 mL

## 2014-07-08 MED ORDER — METOCLOPRAMIDE HCL 5 MG/ML IJ SOLN
INTRAMUSCULAR | Status: AC
Start: 2014-07-08 — End: 2014-07-08
  Filled 2014-07-08: qty 2

## 2014-07-08 MED ORDER — HYDROXYZINE HCL 25 MG PO TABS: 50.0000 mg | ORAL_TABLET | ORAL | Status: DC | PRN

## 2014-07-08 MED ORDER — OXYCODONE HCL 5 MG PO TABS
5.0000 mg | ORAL_TABLET | Freq: Once | ORAL | Status: AC | PRN
  Administered 2014-07-08: 5 mg via ORAL

## 2014-07-08 MED ORDER — LIDOCAINE HCL (CARDIAC) 20 MG/ML IV SOLN
INTRAVENOUS | Status: AC
  Filled 2014-07-08: qty 10

## 2014-07-08 MED ORDER — ROCURONIUM BROMIDE 100 MG/10ML IV SOLN
INTRAVENOUS | Status: DC | PRN
  Administered 2014-07-08: 30 mg via INTRAVENOUS

## 2014-07-08 MED ORDER — SODIUM CHLORIDE 0.9 % IV SOLN: 250.0000 mL | INTRAVENOUS | Status: DC

## 2014-07-08 MED ORDER — ONDANSETRON HCL 4 MG/2ML IJ SOLN: 4.0000 mg | Freq: Four times a day (QID) | INTRAMUSCULAR | Status: DC | PRN

## 2014-07-08 MED ORDER — ONDANSETRON HCL 4 MG/2ML IJ SOLN
INTRAMUSCULAR | Status: AC
  Filled 2014-07-08: qty 2

## 2014-07-08 MED ORDER — SODIUM CHLORIDE 0.9 % IJ SOLN: 3.0000 mL | Freq: Two times a day (BID) | INTRAMUSCULAR | Status: DC

## 2014-07-08 MED ORDER — HYDROCODONE-ACETAMINOPHEN 5-325 MG PO TABS
1.0000 | ORAL_TABLET | ORAL | Status: DC | PRN
  Administered 2014-07-08 (×2): 2 via ORAL
  Filled 2014-07-08 (×2): qty 2

## 2014-07-08 MED ORDER — FAMOTIDINE 20 MG PO TABS
20.0000 mg | ORAL_TABLET | Freq: Two times a day (BID) | ORAL | Status: DC
  Filled 2014-07-08: qty 1

## 2014-07-08 MED ORDER — DEXTROSE 5 % IV SOLN
INTRAVENOUS | Status: DC | PRN
  Administered 2014-07-08: 08:00:00 via INTRAVENOUS

## 2014-07-08 MED ORDER — SCOPOLAMINE 1 MG/3DAYS TD PT72
MEDICATED_PATCH | TRANSDERMAL | Status: AC
  Filled 2014-07-08: qty 1

## 2014-07-08 MED ORDER — CYCLOBENZAPRINE HCL 10 MG PO TABS
10.0000 mg | ORAL_TABLET | Freq: Three times a day (TID) | ORAL | Status: DC | PRN
Start: 2014-07-08 — End: 2014-07-08
  Administered 2014-07-08: 10 mg via ORAL

## 2014-07-08 MED ORDER — ONDANSETRON HCL 4 MG/2ML IJ SOLN
4.0000 mg | Freq: Four times a day (QID) | INTRAMUSCULAR | Status: DC | PRN
Start: 2014-07-08 — End: 2014-07-08

## 2014-07-08 MED ORDER — ALPRAZOLAM 0.5 MG PO TABS
0.5000 mg | ORAL_TABLET | Freq: Three times a day (TID) | ORAL | Status: DC | PRN
  Administered 2014-07-08: 0.5 mg via ORAL
  Filled 2014-07-08: qty 1

## 2014-07-08 MED ORDER — GLYCOPYRROLATE 0.2 MG/ML IJ SOLN
INTRAMUSCULAR | Status: DC | PRN
  Administered 2014-07-08: 0.3 mg via INTRAVENOUS

## 2014-07-08 MED ORDER — ACETAMINOPHEN 10 MG/ML IV SOLN
INTRAVENOUS | Status: AC
  Administered 2014-07-08: 1000 mg via INTRAVENOUS
  Filled 2014-07-08: qty 100

## 2014-07-08 MED ORDER — CYCLOBENZAPRINE HCL 10 MG PO TABS
ORAL_TABLET | ORAL | Status: AC
  Filled 2014-07-08: qty 1

## 2014-07-08 MED ORDER — MIDAZOLAM HCL 5 MG/5ML IJ SOLN
INTRAMUSCULAR | Status: DC | PRN
  Administered 2014-07-08: 2 mg via INTRAVENOUS

## 2014-07-08 MED ORDER — BISACODYL 10 MG RE SUPP: 10.0000 mg | Freq: Every day | RECTAL | Status: DC | PRN

## 2014-07-08 MED ORDER — MAGNESIUM HYDROXIDE 400 MG/5ML PO SUSP: 30.0000 mL | Freq: Every day | ORAL | Status: DC | PRN

## 2014-07-08 MED ORDER — SODIUM CHLORIDE 0.9 % IJ SOLN: 3.0000 mL | INTRAMUSCULAR | Status: DC | PRN

## 2014-07-08 MED ORDER — LACTATED RINGERS IV SOLN
INTRAVENOUS | Status: DC | PRN
  Administered 2014-07-08: 07:00:00 via INTRAVENOUS

## 2014-07-08 MED ORDER — 0.9 % SODIUM CHLORIDE (POUR BTL) OPTIME
TOPICAL | Status: DC | PRN
  Administered 2014-07-08: 1000 mL

## 2014-07-08 MED ORDER — BUPIVACAINE HCL (PF) 0.5 % IJ SOLN
INTRAMUSCULAR | Status: DC | PRN
  Administered 2014-07-08: 5 mL

## 2014-07-08 MED ORDER — MIDAZOLAM HCL 2 MG/2ML IJ SOLN
INTRAMUSCULAR | Status: AC
  Filled 2014-07-08: qty 2

## 2014-07-08 MED ORDER — NEOSTIGMINE METHYLSULFATE 10 MG/10ML IV SOLN
INTRAVENOUS | Status: DC | PRN
  Administered 2014-07-08: 2.5 mg via INTRAVENOUS

## 2014-07-08 MED ORDER — STERILE WATER FOR INJECTION IJ SOLN
INTRAMUSCULAR | Status: AC
  Filled 2014-07-08: qty 10

## 2014-07-08 MED ORDER — PROPOFOL 10 MG/ML IV BOLUS
INTRAVENOUS | Status: AC
  Filled 2014-07-08: qty 20

## 2014-07-08 MED ORDER — LIDOCAINE-EPINEPHRINE 1 %-1:100000 IJ SOLN
INTRAMUSCULAR | Status: DC | PRN
  Administered 2014-07-08: 5 mL

## 2014-07-08 MED ORDER — LIDOCAINE HCL (CARDIAC) 20 MG/ML IV SOLN
INTRAVENOUS | Status: AC
  Filled 2014-07-08: qty 5

## 2014-07-08 MED ORDER — OXYCODONE HCL 5 MG PO TABS
ORAL_TABLET | ORAL | Status: AC
  Filled 2014-07-08: qty 1

## 2014-07-08 MED ORDER — FENTANYL CITRATE 0.05 MG/ML IJ SOLN
INTRAMUSCULAR | Status: DC | PRN
  Administered 2014-07-08 (×5): 50 ug via INTRAVENOUS

## 2014-07-08 MED ORDER — ACETAMINOPHEN 325 MG PO TABS: 650.0000 mg | ORAL_TABLET | ORAL | Status: DC | PRN

## 2014-07-08 MED ORDER — METOCLOPRAMIDE HCL 5 MG/ML IJ SOLN
INTRAMUSCULAR | Status: DC | PRN
  Administered 2014-07-08: 10 mg via INTRAVENOUS

## 2014-07-08 MED ORDER — KCL IN DEXTROSE-NACL 20-5-0.45 MEQ/L-%-% IV SOLN
INTRAVENOUS | Status: DC
  Filled 2014-07-08 (×3): qty 1000

## 2014-07-08 MED ORDER — VECURONIUM BROMIDE 10 MG IV SOLR
INTRAVENOUS | Status: AC
  Filled 2014-07-08: qty 10

## 2014-07-08 MED ORDER — LIDOCAINE HCL (CARDIAC) 20 MG/ML IV SOLN
INTRAVENOUS | Status: DC | PRN
  Administered 2014-07-08: 20 mg via INTRAVENOUS
  Administered 2014-07-08: 80 mg via INTRAVENOUS

## 2014-07-08 MED ORDER — MORPHINE SULFATE 4 MG/ML IJ SOLN: 4.0000 mg | INTRAMUSCULAR | Status: DC | PRN

## 2014-07-08 MED ORDER — PHENYLEPHRINE 40 MCG/ML (10ML) SYRINGE FOR IV PUSH (FOR BLOOD PRESSURE SUPPORT)
PREFILLED_SYRINGE | INTRAVENOUS | Status: AC
  Filled 2014-07-08: qty 20

## 2014-07-08 MED ORDER — VITAMIN D3 25 MCG (1000 UNIT) PO TABS: 4000.0000 [IU] | ORAL_TABLET | ORAL | Status: DC

## 2014-07-08 MED ORDER — PROPOFOL 10 MG/ML IV BOLUS
INTRAVENOUS | Status: DC | PRN
  Administered 2014-07-08: 110 mg via INTRAVENOUS

## 2014-07-08 SURGICAL SUPPLY — 49 items
ADH SKN CLS APL DERMABOND .7 (GAUZE/BANDAGES/DRESSINGS)
ADH SKN CLS LQ APL DERMABOND (GAUZE/BANDAGES/DRESSINGS) ×1
BANDAGE ADH SHEER 1  50/CT (GAUZE/BANDAGES/DRESSINGS) ×4 IMPLANT
BLADE SURG 11 STRL SS (BLADE) ×2 IMPLANT
BLADE SURG ROTATE 9660 (MISCELLANEOUS) IMPLANT
CEMENT BONE KYPHX HV R (Orthopedic Implant) ×1 IMPLANT
CEMENT KYPHON C01A KIT/MIXER (Cement) ×1 IMPLANT
CONT SPEC 4OZ CLIKSEAL STRL BL (MISCELLANEOUS) ×3 IMPLANT
DECANTER SPIKE VIAL GLASS SM (MISCELLANEOUS) ×2 IMPLANT
DERMABOND ADHESIVE PROPEN (GAUZE/BANDAGES/DRESSINGS) ×1
DERMABOND ADVANCED (GAUZE/BANDAGES/DRESSINGS)
DERMABOND ADVANCED .7 DNX12 (GAUZE/BANDAGES/DRESSINGS) IMPLANT
DERMABOND ADVANCED .7 DNX6 (GAUZE/BANDAGES/DRESSINGS) IMPLANT
DRAPE C-ARM 42X72 X-RAY (DRAPES) ×2 IMPLANT
DRAPE INCISE IOBAN 66X45 STRL (DRAPES) ×2 IMPLANT
DRAPE LAPAROTOMY 100X72X124 (DRAPES) ×2 IMPLANT
DRAPE PROXIMA HALF (DRAPES) ×1 IMPLANT
DURAPREP 26ML APPLICATOR (WOUND CARE) ×2 IMPLANT
GAUZE SPONGE 4X4 16PLY XRAY LF (GAUZE/BANDAGES/DRESSINGS) ×2 IMPLANT
GLOVE BIOGEL PI IND STRL 7.0 (GLOVE) IMPLANT
GLOVE BIOGEL PI IND STRL 8 (GLOVE) ×1 IMPLANT
GLOVE BIOGEL PI INDICATOR 7.0 (GLOVE) ×1
GLOVE BIOGEL PI INDICATOR 8 (GLOVE) ×1
GLOVE ECLIPSE 7.5 STRL STRAW (GLOVE) ×2 IMPLANT
GLOVE EXAM NITRILE LRG STRL (GLOVE) IMPLANT
GLOVE EXAM NITRILE MD LF STRL (GLOVE) IMPLANT
GLOVE EXAM NITRILE XL STR (GLOVE) IMPLANT
GLOVE EXAM NITRILE XS STR PU (GLOVE) IMPLANT
GLOVE SURG SS PI 7.0 STRL IVOR (GLOVE) ×2 IMPLANT
GOWN STRL REUS W/ TWL LRG LVL3 (GOWN DISPOSABLE) IMPLANT
GOWN STRL REUS W/ TWL XL LVL3 (GOWN DISPOSABLE) IMPLANT
GOWN STRL REUS W/TWL 2XL LVL3 (GOWN DISPOSABLE) IMPLANT
GOWN STRL REUS W/TWL LRG LVL3 (GOWN DISPOSABLE)
GOWN STRL REUS W/TWL XL LVL3 (GOWN DISPOSABLE) ×4
KIT BASIN OR (CUSTOM PROCEDURE TRAY) ×2 IMPLANT
KIT ROOM TURNOVER OR (KITS) ×2 IMPLANT
NDL HYPO 25X1 1.5 SAFETY (NEEDLE) ×1 IMPLANT
NEEDLE HYPO 25X1 1.5 SAFETY (NEEDLE) ×2 IMPLANT
NS IRRIG 1000ML POUR BTL (IV SOLUTION) ×2 IMPLANT
PACK SURGICAL SETUP 50X90 (CUSTOM PROCEDURE TRAY) ×2 IMPLANT
PAD ARMBOARD 7.5X6 YLW CONV (MISCELLANEOUS) ×8 IMPLANT
SPECIMEN JAR SMALL (MISCELLANEOUS) IMPLANT
SUT VIC AB 3-0 SH 8-18 (SUTURE) ×2 IMPLANT
SUT VIC AB 4-0 P-3 18X BRD (SUTURE) ×1 IMPLANT
SUT VIC AB 4-0 P3 18 (SUTURE) ×2
SYR CONTROL 10ML LL (SYRINGE) ×3 IMPLANT
TOWEL OR 17X24 6PK STRL BLUE (TOWEL DISPOSABLE) ×2 IMPLANT
TOWEL OR 17X26 10 PK STRL BLUE (TOWEL DISPOSABLE) ×2 IMPLANT
TRAY KYPHOPAK 20/3 ONESTEP 1ST (MISCELLANEOUS) ×1 IMPLANT

## 2014-07-08 NOTE — Discharge Instructions (Signed)

## 2014-07-08 NOTE — Anesthesia Preprocedure Evaluation (Signed)
Anesthesia Evaluation  Patient identified by MRN, date of birth, ID band Patient awake    Reviewed: Allergy & Precautions, H&P , NPO status , Patient's Chart, lab work & pertinent test results  History of Anesthesia Complications (+) PONV and history of anesthetic complications  Airway Mallampati: II  Neck ROM: full    Dental   Pulmonary COPDCurrent Smoker,          Cardiovascular negative cardio ROS      Neuro/Psych  Headaches, Anxiety Depression    GI/Hepatic GERD-  ,  Endo/Other    Renal/GU      Musculoskeletal  (+) Arthritis -,   Abdominal   Peds  Hematology   Anesthesia Other Findings   Reproductive/Obstetrics                           Anesthesia Physical Anesthesia Plan  ASA: III  Anesthesia Plan: General   Post-op Pain Management:    Induction: Intravenous  Airway Management Planned: Oral ETT  Additional Equipment:   Intra-op Plan:   Post-operative Plan: Extubation in OR  Informed Consent: I have reviewed the patients History and Physical, chart, labs and discussed the procedure including the risks, benefits and alternatives for the proposed anesthesia with the patient or authorized representative who has indicated his/her understanding and acceptance.     Plan Discussed with: CRNA, Anesthesiologist and Surgeon  Anesthesia Plan Comments:         Anesthesia Quick Evaluation

## 2014-07-08 NOTE — Op Note (Signed)
07/08/2014  8:39 AM  PATIENT:  Misty [name]  73 y.o. female  PRE-OPERATIVE DIAGNOSIS:  T11 compression Fracture/Back pain/Osteoporosis/Thoracic pain  POST-OPERATIVE DIAGNOSIS:  T11 compression Fracture /Back pain/Osteoporosis/thoracic Pain  PROCEDURE:  Procedure(s):  T11 Kyphoplasty  SURGEON:  Surgeon(s): Hewitt Shorts, MD  ANESTHESIA:   general  EBL:  Total I/O In: 50 [I.V.:50] Out: -   BLOOD ADMINISTERED:none  COUNT: Correct per nursing staff  DICTATION: Patient was brought to the operating room, placed under general endotracheal anesthesia. AP and lateral C-arm fluoroscopy units were set up, and the T11 vertebra identified. The thoracolumbar region posteriorly was prepped with DuraPrep, and draped in a sterile fashion. The C-arm fluoroscopy units were also draped in a sterile fashion. The entry points to approach the posterior lateral aspect of the T11 pedicles were identified bilaterally.  The skin and subcutaneous tissue after the local anesthetic with epinephrine. 3 mm incisions were made on either side at the entry points. The cannulated trocar was passed down to the posterior lateral entry point of the pedicles, on each side. Each trocar was passed through the pedicle with C-arm fluoroscopic guidance into the vertebral body on either side. We then removed the inner trocar, leaving the outer cannula. Using the drill we created a passage through the vertebral body, to the ventral wall. The balloon expanders were placed in the vertebral body on either side, and gently inflated and expanded under C-arm fluoroscopic guidance. Once good compaction was achieved bilaterally, the balloon expanders were deflated, and we began to fill the cavity that had been created with methylmethacrylate. We'll make filling from one side to the other, using C-arm fluoroscopic guidance, until good filling of the cavity, and good interdigitation with the bone was achieved bilaterally. A total of 5 cc of  methylmethacrylate was introduced. We then carefully removed each of the cannulas, using C-arm for guidance. Final imaging showed good filling of the cavity, with no extravasation. Each of the incisions was closed with a single 3-0 undyed Vicryl subcuticular suture, and the Dermabond. Following surgery the patient was turned back to a supine position, to be reversed and the anesthetic, extubated, and transferred to the recovery room for further care.  PLAN OF CARE: Admit for overnight observation  PATIENT DISPOSITION:  PACU - hemodynamically stable.   Delay start of Pharmacological VTE agent (>24hrs) due to surgical blood loss or risk of bleeding:  yes

## 2014-07-08 NOTE — H&P (Signed)
Subjective: Patient is a 73 y.o. female who is admitted for treatment of a T11 compression fracture.  Patient developed pain in the thoracolumbar junction no particular cause about 4 weeks ago. It extends down to the lumbar region diffusely. She denies any complaints into the lower extremities. X-rays show a T11 compression fracture. Patient is admitted now for a T11 kyphoplasty.    Patient Active Problem List   Diagnosis Date Noted  . Hyperlipidemia 08/19/2013  . Fibrocystic breast disease 08/19/2013  . History of migraine headaches 08/19/2013  . GERD (gastroesophageal reflux disease) 04/07/2013  . Osteoporosis, postmenopausal 04/07/2013   Past Medical History  Diagnosis Date  . Migraines   . Osteoporosis   . Hyperlipidemia   . PONV (postoperative nausea and vomiting)   . History of bronchitis   . History of migraine     many yrs ago  . Numbness     left leg  . Chronic back pain   . Arthritis     back  . GERD (gastroesophageal reflux disease)     takes Ranidine daily  . Constipation     OTC stool softener prn  . Hemorrhoids   . Diverticulosis   . Urinary frequency   . Urinary urgency   . Nocturia   . Depression   . Anxiety     takes Xanax daily  . Insomnia   . History of shingles   . Cataract   . COPD (chronic obstructive pulmonary disease)     Past Surgical History  Procedure Laterality Date  . Abdominal hysterectomy    . Esophagogastroduodenoscopy    . Colonoscopy    . Lumbar laminectomy/decompression microdiscectomy Bilateral 05/20/2013    Procedure: LUMBAR LAMINECTOMY/DECOMPRESSION MICRODISCECTOMY 1 LEVEL;  Surgeon: Hewitt Shorts, MD;  Location: [facility];  Service: Neurosurgery;  Laterality: Bilateral;  Bilateral Lumbar four-five laminotomy and left lumbar four-five microdiskectomy  . Eye surgery Right 3/15    cataracts    Prescriptions prior to admission  Medication Sig Dispense Refill  . ALPRAZolam (XANAX) 0.5 MG tablet TAKE ONE TABLET BY MOUTH  THREE TIMES A DAY AS NEEDED FOR ANXIETY  90 tablet  3  . cholecalciferol (VITAMIN D) 1000 UNITS tablet Take 2,000-4,000 Units by mouth daily. Take 2000 units daily except take 4000 units on Saturday and Sunday.      Marland Kitchen HYDROcodone-acetaminophen (NORCO/VICODIN) 5-325 MG per tablet Take 1 tablet by mouth every 6 (six) hours as needed for moderate pain.  30 tablet  0  . Multiple Vitamins-Minerals (CENTRUM SILVER PO) Take 1 tablet by mouth daily.      . ranitidine (ZANTAC) 150 MG tablet Take 150 mg by mouth 2 (two) times daily.       . vitamin B-12 (CYANOCOBALAMIN) 1000 MCG tablet Take 1,000 mcg by mouth daily.      Marland Kitchen acetaminophen (TYLENOL) 500 MG tablet Take 500 mg by mouth every 6 (six) hours as needed for moderate pain or headache.        Allergies  Allergen Reactions  . Codeine Nausea And Vomiting  . Asa [Aspirin] Other (See Comments)    Patient is unaware of allergy  . Azithromycin Other (See Comments)    Patient is unaware of allergy  . Celebrex [Celecoxib] Other (See Comments)    Irritated stomach   . Cymbalta [Duloxetine Hcl] Other (See Comments)    Felt groggy  . Prednisone Rash    She has seen the podiatrist and he had injected cortisone in her feet and  she had also taken a peel at home. She had a severe reaction to her face with a rash and had to take Benadryl to resolve the symptom. She actually did get more cortisone shots, but no additional reactions to the shots.  . Vioxx [Rofecoxib] Other (See Comments)    Unknown  . Zelnorm [Tegaserod] Other (See Comments)    Patient is unaware of allergy  . Zocor [Simvastatin] Other (See Comments)    Patient is unaware of allergy    History  Substance Use Topics  . Smoking status: Current Some Day Smoker -- 1.00 packs/day for 52 years    Types: Cigarettes    Start date: 11/18/1958    Last Attempt to Quit: 08/20/2011  . Smokeless tobacco: Never Used  . Alcohol Use: No    Family History  Problem Relation Age of Onset  .  Hypertension Mother      Review of Systems A comprehensive review of systems was negative.  Objective: Vital signs in last 24 hours: Temp:  [97.8 F (36.6 C)] 97.8 F (36.6 C) (08/21 0634) Pulse Rate:  [67] 67 (08/21 0634) Resp:  [18] 18 (08/21 0634) BP: (122)/(55) 122/55 mmHg (08/21 0634) SpO2:  [95 %] 95 % (08/21 0634) Weight:  [57.153 kg (126 lb)] 57.153 kg (126 lb) (08/21 0634)  EXAM: Patient is a thin white female in discomfort, but no acute distress. Lungs are clear to auscultation , the patient has symmetrical respiratory excursion. Heart has a regular rate and rhythm normal S1 and S2 no murmur.   Abdomen is soft nontender nondistended bowel sounds are present. Extremity examination shows no clubbing cyanosis or edema. Musculoskeletal examination shows tenderness to palpation over the thoracolumbar junction region, but also in the lumbar region diffusely. Motor examination shows 5 over 5 strength in the lower extremities including the iliopsoas quadriceps dorsiflexor extensor hallicus  longus and plantar flexor bilaterally. Sensation is intact to pinprick in the distal lower extremities. Reflexes are symmetrical bilaterally. No pathologic reflexes are present. Patient has a normal gait and stance.   Data Review:CBC    Component Value Date/Time   WBC 6.6 07/06/2014 1406   WBC 7.3 04/19/2014 1010   RBC 4.16 07/06/2014 1406   RBC 4.7 04/19/2014 1010   HGB 12.8 07/06/2014 1406   HGB 13.9 04/19/2014 1010   HCT 39.5 07/06/2014 1406   HCT 43.6 04/19/2014 1010   PLT 205 07/06/2014 1406   MCV 95.0 07/06/2014 1406   MCV 92.9 04/19/2014 1010   MCH 30.8 07/06/2014 1406   MCH 29.6 04/19/2014 1010   MCHC 32.4 07/06/2014 1406   MCHC 31.8 04/19/2014 1010   RDW 13.4 07/06/2014 1406                          BMET    Component Value Date/Time   NA 143 07/06/2014 1406   NA 143 04/19/2014 0956   K 3.9 07/06/2014 1406   CL 103 07/06/2014 1406   CO2 29 07/06/2014 1406   GLUCOSE 102* 07/06/2014 1406   GLUCOSE 96  04/19/2014 0956   BUN 10 07/06/2014 1406   BUN 15 04/19/2014 0956   CREATININE 0.83 07/06/2014 1406   CALCIUM 9.0 07/06/2014 1406   GFRNONAA 68* 07/06/2014 1406   GFRAA 79* 07/06/2014 1406     Assessment/Plan: Patient with T11 compression fracture, admitted for T11 kyphoplasty.  I discussed me to patient's condition, the nature the surgical procedure with she and her husband. I  discussed risks of infection, bleeding, neurologic dysfunction including paralysis, loss of sensation, and disruption of bowel and bladder function, and anesthetic risks of myocardial infarction, stroke, pneumonia, and death.   Hewitt ShortsNUDELMAN,ROBERT W, MD 07/08/2014 7:37 AM

## 2014-07-08 NOTE — Anesthesia Postprocedure Evaluation (Signed)
Anesthesia Post Note  Patient: Misty [name]  Procedure(s) Performed: Procedure(s) (LRB): Thoracic Eleven Kyphoplasty (N/A)  Anesthesia type: General  Patient location: PACU  Post pain: Pain level controlled and Adequate analgesia  Post assessment: Post-op Vital signs reviewed, Patient's Cardiovascular Status Stable, Respiratory Function Stable, Patent Airway and Pain level controlled  Last Vitals:  Filed Vitals:   07/08/14 1204  BP: 126/75  Pulse: 66  Temp: 36.7 C  Resp: 16    Post vital signs: Reviewed and stable  Level of consciousness: awake, alert  and oriented  Complications: No apparent anesthesia complications

## 2014-07-08 NOTE — Anesthesia Procedure Notes (Signed)
Procedure Name: Intubation Date/Time: 07/08/2014 7:50 AM Performed by: Wray Kearns A Pre-anesthesia Checklist: Patient identified, Timeout performed, Emergency Drugs available, Suction available and Patient being monitored Patient Re-evaluated:Patient Re-evaluated prior to inductionOxygen Delivery Method: Circle system utilized Preoxygenation: Pre-oxygenation with 100% oxygen Intubation Type: IV induction and Cricoid Pressure applied Ventilation: Mask ventilation without difficulty Laryngoscope Size: Mac and 3 Grade View: Grade I Tube type: Oral Tube size: 7.5 mm Number of attempts: 1 Airway Equipment and Method: Stylet Placement Confirmation: ETT inserted through vocal cords under direct vision,  breath sounds checked- equal and bilateral and positive ETCO2 Secured at: 21 cm Tube secured with: Tape Dental Injury: Teeth and Oropharynx as per pre-operative assessment

## 2014-07-08 NOTE — Transfer of Care (Signed)
Immediate Anesthesia Transfer of Care Note  Patient: Misty [name]  Procedure(s) Performed: Procedure(s): Thoracic Eleven Kyphoplasty (N/A)  Patient Location: PACU  Anesthesia Type:General  Level of Consciousness: awake, alert , oriented, patient cooperative and responds to stimulation  Airway & Oxygen Therapy: Patient Spontanous Breathing and Patient connected to nasal cannula oxygen  Post-op Assessment: Report given to PACU RN, Post -op Vital signs reviewed and stable, Patient moving all extremities and Patient moving all extremities X 4  Post vital signs: Reviewed and stable  Complications: No apparent anesthesia complications

## 2014-07-08 NOTE — Discharge Summary (Signed)
Physician Discharge Summary  Patient ID: Misty [name] MRN: [id number] DOB/AGE: 73-Aug-1942 73 y.o.  Admit date: 07/08/2014 Discharge date: 07/08/2014  Admission Diagnoses:  T11 compression fracture  Discharge Diagnoses:  T11 compression fracture Active Problems:   Non-traumatic compression fracture of T11 thoracic vertebra   Discharged Condition: good  Hospital Course: Patient was admitted, underwent a T11 kyphoplasty. She's doing well. She is up and living. She is asking to be discharged to home. She is to followup with me in the office in 3 weeks. Her incisions are clean and dry.  Discharge Exam: Blood pressure 124/73, pulse 69, temperature 98.1 F (36.7 C), temperature source Oral, resp. rate 16, weight 57.153 kg (126 lb), SpO2 92.00%.  Disposition: Home     Medication List         acetaminophen 500 MG tablet  Commonly known as:  TYLENOL  Take 500 mg by mouth every 6 (six) hours as needed for moderate pain or headache.     ALPRAZolam 0.5 MG tablet  Commonly known as:  XANAX  TAKE ONE TABLET BY MOUTH THREE TIMES A DAY AS NEEDED FOR ANXIETY     CENTRUM SILVER PO  Take 1 tablet by mouth daily.     cholecalciferol 1000 UNITS tablet  Commonly known as:  VITAMIN D  Take 2,000-4,000 Units by mouth daily. Take 2000 units daily except take 4000 units on Saturday and Sunday.     HYDROcodone-acetaminophen 5-325 MG per tablet  Commonly known as:  NORCO/VICODIN  Take 1 tablet by mouth every 6 (six) hours as needed for moderate pain.     HYDROcodone-acetaminophen 5-325 MG per tablet  Commonly known as:  NORCO/VICODIN  Take 0.5-1 tablets by mouth every 4 (four) hours as needed (mild pain).     ranitidine 150 MG tablet  Commonly known as:  ZANTAC  Take 150 mg by mouth 2 (two) times daily.     vitamin B-12 1000 MCG tablet  Commonly known as:  CYANOCOBALAMIN  Take 1,000 mcg by mouth daily.         Signed: Hewitt Shorts, MD 07/08/2014, 6:50 PM

## 2014-07-08 NOTE — Progress Notes (Signed)
Pt doing well. Pt and family given D/C instructions with Rx, verbal understanding was provided. Pt's IV was removed prior to D/C. Pt D/C'd home via wheelchair @ 1925 per MD order. Pt is stable @ D/C and has no other needs. Rema Fendt, RN

## 2014-07-08 NOTE — Progress Notes (Signed)
tranpsorted per EVET, NT...teeth returned

## 2014-07-11 ENCOUNTER — Encounter (HOSPITAL_COMMUNITY): Payer: Self-pay | Admitting: Neurosurgery

## 2014-07-29 DIAGNOSIS — M545 Low back pain, unspecified: Secondary | ICD-10-CM | POA: Diagnosis not present

## 2014-08-15 ENCOUNTER — Ambulatory Visit (INDEPENDENT_AMBULATORY_CARE_PROVIDER_SITE_OTHER): Payer: Medicare Other

## 2014-08-15 ENCOUNTER — Encounter: Payer: Self-pay | Admitting: Family Medicine

## 2014-08-15 ENCOUNTER — Ambulatory Visit (INDEPENDENT_AMBULATORY_CARE_PROVIDER_SITE_OTHER): Payer: Medicare Other | Admitting: Family Medicine

## 2014-08-15 VITALS — BP 118/74 | HR 97 | Temp 98.7°F | Ht 67.0 in | Wt 121.8 lb

## 2014-08-15 DIAGNOSIS — J209 Acute bronchitis, unspecified: Secondary | ICD-10-CM

## 2014-08-15 DIAGNOSIS — R0989 Other specified symptoms and signs involving the circulatory and respiratory systems: Secondary | ICD-10-CM

## 2014-08-15 DIAGNOSIS — R05 Cough: Secondary | ICD-10-CM | POA: Diagnosis not present

## 2014-08-15 DIAGNOSIS — R059 Cough, unspecified: Secondary | ICD-10-CM

## 2014-08-15 LAB — POCT CBC
Granulocyte percent: 73.3 %G (ref 37–80)
HEMATOCRIT: 43.7 % (ref 37.7–47.9)
HEMOGLOBIN: 14 g/dL (ref 12.2–16.2)
LYMPH, POC: 1.4 (ref 0.6–3.4)
MCH, POC: 29.4 pg (ref 27–31.2)
MCHC: 32 g/dL (ref 31.8–35.4)
MCV: 92 fL (ref 80–97)
MPV: 8.8 fL (ref 0–99.8)
POC Granulocyte: 5.1 (ref 2–6.9)
POC LYMPH PERCENT: 20.3 %L (ref 10–50)
Platelet Count, POC: 203 10*3/uL (ref 142–424)
RBC: 4.8 M/uL (ref 4.04–5.48)
RDW, POC: 12.9 %
WBC: 7 10*3/uL (ref 4.6–10.2)

## 2014-08-15 MED ORDER — PREDNISONE 10 MG PO TABS: 40.0000 mg | ORAL_TABLET | Freq: Every day | ORAL | Status: DC

## 2014-08-15 MED ORDER — METHYLPREDNISOLONE ACETATE 80 MG/ML IJ SUSP
60.0000 mg | Freq: Once | INTRAMUSCULAR | Status: AC
  Administered 2014-08-15: 60 mg via INTRAMUSCULAR

## 2014-08-15 MED ORDER — BUDESONIDE 0.5 MG/2ML IN SUSP: 0.5000 mg | Freq: Two times a day (BID) | RESPIRATORY_TRACT | Status: DC

## 2014-08-15 NOTE — Patient Instructions (Addendum)
Take Mucinex maximum strength one twice daily with a large glass of water for cough and congestion as directed Drink plenty of fluids Take Tylenol as needed for aches pains and fever His nebulizer as directed

## 2014-08-15 NOTE — Progress Notes (Signed)
Subjective:    Patient ID: Misty [name], female    DOB: 07-09-1941, 73 y.o.   MRN: [id number]  HPI Pt is here today for chest congestion and cough.  Symptoms started 1 week ago.         Patient Active Problem List   Diagnosis Date Noted  . Non-traumatic compression fracture of T11 thoracic vertebra 07/08/2014  . Hyperlipidemia 08/19/2013  . Fibrocystic breast disease 08/19/2013  . History of migraine headaches 08/19/2013  . GERD (gastroesophageal reflux disease) 04/07/2013  . Osteoporosis, postmenopausal 04/07/2013   Outpatient Encounter Prescriptions as of 08/15/2014  Medication Sig  . acetaminophen (TYLENOL) 500 MG tablet Take 500 mg by mouth every 6 (six) hours as needed for moderate pain or headache.   . ALPRAZolam (XANAX) 0.5 MG tablet TAKE ONE TABLET BY MOUTH THREE TIMES A DAY AS NEEDED FOR ANXIETY  . cholecalciferol (VITAMIN D) 1000 UNITS tablet Take 2,000-4,000 Units by mouth daily. Take 2000 units daily except take 4000 units on Saturday and Sunday.  Marland Kitchen HYDROcodone-acetaminophen (NORCO/VICODIN) 5-325 MG per tablet Take 1 tablet by mouth every 6 (six) hours as needed for moderate pain.  . Multiple Vitamins-Minerals (CENTRUM SILVER PO) Take 1 tablet by mouth daily.  . ranitidine (ZANTAC) 150 MG tablet Take 150 mg by mouth 2 (two) times daily.   . vitamin B-12 (CYANOCOBALAMIN) 1000 MCG tablet Take 1,000 mcg by mouth daily.  . [DISCONTINUED] HYDROcodone-acetaminophen (NORCO/VICODIN) 5-325 MG per tablet Take 0.5-1 tablets by mouth every 4 (four) hours as needed (mild pain).      Review of Systems  Constitutional: Positive for fever (low grade).  HENT: Positive for congestion and rhinorrhea.   Respiratory: Positive for cough (productive) and wheezing.   Neurological: Positive for headaches.       Objective:   Physical Exam  Nursing note and vitals reviewed. Constitutional: She is oriented to person, place, and time. She appears well-developed and well-nourished.  No distress.  The patient is thin and somewhat frail in appearance  HENT:  Head: Normocephalic and atraumatic.  Mouth/Throat: Oropharynx is clear and moist.  The patient has bilateral ear cerumen and tympanic membranes were not visualized. Nasal turbinate congestion bilaterally left greater than right  Eyes: Conjunctivae and EOM are normal. Pupils are equal, round, and reactive to light. Right eye exhibits no discharge. Left eye exhibits no discharge. No scleral icterus.  Neck: Normal range of motion. Neck supple. No thyromegaly present.  No anterior cervical nodes  Cardiovascular: Normal rate, regular rhythm and normal heart sounds.   No murmur heard. Pulmonary/Chest: Effort normal. No respiratory distress. She has wheezes. She has no rales.  Wheezes and rhonchi bilaterally  Abdominal: Soft. Bowel sounds are normal. She exhibits no mass. There is tenderness. There is no rebound and no guarding.  Minimal suprapubic tenderness  Musculoskeletal: Normal range of motion. She exhibits no edema and no tenderness.  Lymphadenopathy:    She has no cervical adenopathy.  Neurological: She is alert and oriented to person, place, and time.  Skin: Skin is warm and dry. No rash noted.  Psychiatric: She has a normal mood and affect. Her behavior is normal. Judgment and thought content normal.   BP 118/74  Pulse 97  Temp(Src) 98.7 F (37.1 C) (Oral)  Ht 5\' 7"  (1.702 m)  Wt 121 lb 12.8 oz (55.248 kg)  BMI 19.07 kg/m2  Results for orders placed in visit on 08/15/14  POCT CBC      Result Value Ref Range  WBC 7.0  4.6 - 10.2 K/uL   Lymph, poc 1.4  0.6 - 3.4   POC LYMPH PERCENT 20.3  10 - 50 %L   MID (cbc)    0 - 0.9   POC MID %    0 - 12 %M   POC Granulocyte 5.1  2 - 6.9   Granulocyte percent 73.3  37 - 80 %G   RBC 4.8  4.04 - 5.48 M/uL   Hemoglobin 14.0  12.2 - 16.2 g/dL   HCT, POC 16.1  09.6 - 47.9 %   MCV 92.0  80 - 97 fL   MCH, POC 29.4  27 - 31.2 pg   MCHC 32.0  31.8 - 35.4 g/dL   RDW,  POC 04.5     Platelet Count, POC 203.0  142 - 424 K/uL   MPV 8.8  0 - 99.8 fL   The patient was informed of this before she left the office. The patient's breathing and air movement was improved after her nebulizer treatment with Pulmicort and albuterol       Assessment & Plan:  1. Cough - POCT CBC - DG Chest 2 View - methylPREDNISolone acetate (DEPO-MEDROL) injection 60 mg; Inject 0.75 mLs (60 mg total) into the muscle once.  2. Chest congestion - POCT CBC - DG Chest 2 View - methylPREDNISolone acetate (DEPO-MEDROL) injection 60 mg; Inject 0.75 mLs (60 mg total) into the muscle once.  3. Acute bronchitis with bronchospasm  Meds ordered this encounter  Medications  . budesonide (PULMICORT) 0.5 MG/2ML nebulizer solution    Sig: Take 2 mLs (0.5 mg total) by nebulization 2 (two) times daily.    Dispense:  120 mL    Refill:  12  . predniSONE (DELTASONE) 10 MG tablet    Sig: Take 4 tablets (40 mg total) by mouth daily with breakfast. X 2 days Take 3 tablets x 2 days Take 2 tablets x 2 days Take 1 tablet x 2 days    Dispense:  20 tablet    Refill:  0  . methylPREDNISolone acetate (DEPO-MEDROL) injection 60 mg    Sig:      Patient Instructions  Take Mucinex maximum strength one twice daily with a large glass of water for cough and congestion as directed Drink plenty of fluids Take Tylenol as needed for aches pains and fever His nebulizer as directed    Nyra Capes MD

## 2014-08-16 ENCOUNTER — Telehealth: Payer: Self-pay

## 2014-08-16 NOTE — Telephone Encounter (Signed)
Message copied by Roselee Culver on Tue Aug 16, 2014 10:53 AM ------      Message from: Ernestina Penna      Created: Mon Aug 15, 2014  4:31 PM       As per radiology report ------

## 2014-08-16 NOTE — Telephone Encounter (Signed)
Pt aware of CXR results.

## 2014-08-23 ENCOUNTER — Ambulatory Visit: Payer: Medicare Other | Admitting: Family Medicine

## 2014-08-25 ENCOUNTER — Ambulatory Visit (INDEPENDENT_AMBULATORY_CARE_PROVIDER_SITE_OTHER): Payer: Medicare Other | Admitting: Family Medicine

## 2014-08-25 ENCOUNTER — Encounter: Payer: Self-pay | Admitting: Family Medicine

## 2014-08-25 VITALS — BP 105/65 | HR 81 | Temp 97.9°F | Ht 67.0 in | Wt 123.0 lb

## 2014-08-25 DIAGNOSIS — E559 Vitamin D deficiency, unspecified: Secondary | ICD-10-CM

## 2014-08-25 DIAGNOSIS — E785 Hyperlipidemia, unspecified: Secondary | ICD-10-CM | POA: Diagnosis not present

## 2014-08-25 DIAGNOSIS — K219 Gastro-esophageal reflux disease without esophagitis: Secondary | ICD-10-CM

## 2014-08-25 DIAGNOSIS — J40 Bronchitis, not specified as acute or chronic: Secondary | ICD-10-CM | POA: Diagnosis not present

## 2014-08-25 LAB — POCT CBC
GRANULOCYTE PERCENT: 79.1 % (ref 37–80)
HCT, POC: 42.6 % (ref 37.7–47.9)
Hemoglobin: 13.6 g/dL (ref 12.2–16.2)
LYMPH, POC: 1.8 (ref 0.6–3.4)
MCH, POC: 29.5 pg (ref 27–31.2)
MCHC: 32 g/dL (ref 31.8–35.4)
MCV: 92.3 fL (ref 80–97)
MPV: 8.8 fL (ref 0–99.8)
PLATELET COUNT, POC: 214 10*3/uL (ref 142–424)
POC Granulocyte: 7.4 — AB (ref 2–6.9)
POC LYMPH %: 18.9 % (ref 10–50)
RBC: 4.6 M/uL (ref 4.04–5.48)
RDW, POC: 14.4 %
WBC: 9.4 10*3/uL (ref 4.6–10.2)

## 2014-08-25 NOTE — Patient Instructions (Addendum)
Medicare Annual Wellness Visit  [facility] and the medical providers at [facility] strive to bring you the best medical care.  In doing so we not only want to address your current medical conditions and concerns but also to detect new conditions early and prevent illness, disease and health-related problems.    Medicare offers a yearly Wellness Visit which allows our clinical staff to assess your need for preventative services including immunizations, lifestyle education, counseling to decrease risk of preventable diseases and screening for fall risk and other medical concerns.    This visit is provided free of charge (no copay) for all Medicare recipients. The clinical pharmacists at [facility] have begun to conduct these Wellness Visits which will also include a thorough review of all your medications.    As you primary medical provider recommend that you make an appointment for your Annual Wellness Visit if you have not done so already this year.  You may set up this appointment before you leave today or you may call back ([phone]) and schedule an appointment.  Please make sure when you call that you mention that you are scheduling your Annual Wellness Visit with the clinical pharmacist so that the appointment may be made for the proper length of time.     Continue current medications. Continue good therapeutic lifestyle changes which include good diet and exercise. Fall precautions discussed with patient. If an FOBT was given today- please return it to our front desk. If you are over 50 years old - you may need Prevnar 18 or the adult Pneumonia vaccine.  Flu Shots will be available at our office starting mid- September. Please call and schedule a FLU CLINIC APPOINTMENT.   Continue to take Mucinex Discuss with the neurosurgeon regarding any physical therapy Continue to drink plenty of fluids

## 2014-08-25 NOTE — Progress Notes (Signed)
Subjective:    Patient ID: Misty Misty [name], female    DOB: [birth year], 73 y.o.   MRN: [id number]  HPI Pt here for follow up and management of chronic medical problems. Patient still has some congestion and cough but she is doing better since the last visit. She she has gained some weight since the last visit.She is still being followed by the neurosurgeon for her back surgery. She is not doing any physical therapy. The patient comes to the visit today with her husband        Patient Active Problem List   Diagnosis Date Noted  . Non-traumatic compression fracture of T11 thoracic vertebra 07/08/2014  . Hyperlipidemia 08/19/2013  . Fibrocystic breast disease 08/19/2013  . History of migraine headaches 08/19/2013  . GERD (gastroesophageal reflux disease) 04/07/2013  . Osteoporosis, postmenopausal 04/07/2013   Outpatient Encounter Prescriptions as of 08/25/2014  Medication Sig  . acetaminophen (TYLENOL) 500 MG tablet Take 500 mg by mouth every 6 (six) hours as needed for moderate pain or headache.   . ALPRAZolam (XANAX) 0.5 MG tablet TAKE ONE TABLET BY MOUTH THREE TIMES A DAY AS NEEDED FOR ANXIETY  . budesonide (PULMICORT) 0.5 MG/2ML nebulizer solution Take 2 mLs (0.5 mg total) by nebulization 2 (two) times daily.  . cholecalciferol (VITAMIN D) 1000 UNITS tablet Take 2,000-4,000 Units by mouth daily. Take 2000 units daily except take 4000 units on Saturday and Sunday.  Marland Kitchen HYDROcodone-acetaminophen (NORCO/VICODIN) 5-325 MG per tablet Take 1 tablet by mouth every 6 (six) hours as needed for moderate pain.  . Multiple Vitamins-Minerals (CENTRUM SILVER PO) Take 1 tablet by mouth daily.  . ranitidine (ZANTAC) 150 MG tablet Take 150 mg by mouth 2 (two) times daily.   . vitamin B-12 (CYANOCOBALAMIN) 1000 MCG tablet Take 1,000 mcg by mouth daily.  . [DISCONTINUED] predniSONE (DELTASONE) 10 MG tablet Take 4 tablets (40 mg total) by mouth daily with breakfast. X 2 days Take 3 tablets x 2 days Take  2 tablets x 2 days Take 1 tablet x 2 days    Review of Systems  Constitutional: Negative.   HENT: Positive for congestion (some better).   Eyes: Negative.   Respiratory: Positive for cough (some better).   Cardiovascular: Negative.   Gastrointestinal: Negative.   Endocrine: Negative.   Genitourinary: Negative.   Musculoskeletal: Negative.   Skin: Negative.   Allergic/Immunologic: Negative.   Neurological: Negative.   Hematological: Negative.   Psychiatric/Behavioral: Negative.        Objective:   Physical Exam  Nursing note and vitals reviewed. Constitutional: She is oriented to person, place, and time. She appears well-developed and well-nourished. No distress.  HENT:  Misty [name] Normocephalic and atraumatic.  Right Ear: External ear normal.  Left Ear: External ear normal.  Nose: Nose normal.  Mouth/Throat: Oropharynx is clear and moist. No oropharyngeal exudate.  Eyes: Conjunctivae and EOM are normal. Pupils are equal, round, and reactive to light. Right eye exhibits no discharge. Left eye exhibits no discharge. No scleral icterus.  Neck: Normal range of motion. Neck supple. No thyromegaly present.  Cardiovascular: Normal rate, normal heart sounds and intact distal pulses.   No murmur heard. At 60-72 per minute and slightly irregular  Pulmonary/Chest: Effort normal and breath sounds normal. No respiratory distress. She has no wheezes. She has no rales.  Patient has a tight cough but improved from before  Abdominal: Soft. Bowel sounds are normal. She exhibits no mass. There is no tenderness. There is no rebound and no  guarding.  Musculoskeletal: Normal range of motion. She exhibits no edema and no tenderness.  Lymphadenopathy:    She has no cervical adenopathy.  Neurological: She is alert and oriented to person, place, and time. She has normal reflexes. No cranial nerve deficit.  Skin: Skin is warm and dry. No rash noted.  Psychiatric: She has a normal mood and affect. Her  behavior is normal. Judgment and thought content normal.   BP 105/65  Pulse 81  Temp(Src) 97.9 F (36.6 C) (Oral)  Ht $R'5\' 7"'DM$  (1.702 m)  Wt 123 lb (55.792 kg)  BMI 19.26 kg/m2        Assessment & Plan:  1. Gastroesophageal reflux disease, esophagitis presence not specified - POCT CBC  2. Hyperlipidemia - POCT CBC - BMP8+EGFR - Hepatic function panel - NMR, lipoprofile  3. Vitamin D deficiency - POCT CBC - Vit D  25 hydroxy (rtn osteoporosis monitoring)  4. Bronchitis   No orders of the defined types were placed in this encounter.    Patient Instructions                       Medicare Annual Wellness Visit  [facility] and the medical providers at [facility] strive to bring you the best medical care.  In doing so we not only want to address your current medical conditions and concerns but also to detect new conditions early and prevent illness, disease and health-related problems.    Medicare offers a yearly Wellness Visit which allows our clinical staff to assess your need for preventative services including immunizations, lifestyle education, counseling to decrease risk of preventable diseases and screening for fall risk and other medical concerns.    This visit is provided free of charge (no copay) for all Medicare recipients. The clinical pharmacists at [facility] have begun to conduct these Wellness Visits which will also include a thorough review of all your medications.    As you primary medical provider recommend that you make an appointment for your Annual Wellness Visit if you have not done so already this year.  You may set up this appointment before you leave today or you may call back ([phone]) and schedule an appointment.  Please make sure when you call that you mention that you are scheduling your Annual Wellness Visit with the clinical pharmacist so that the appointment may be made for the proper length of time.      Continue current medications. Continue good therapeutic lifestyle changes which include good diet and exercise. Fall precautions discussed with patient. If an FOBT was given today- please return it to our front desk. If you are over 76 years old - you may need Prevnar 98 or the adult Pneumonia vaccine.  Flu Shots will be available at our office starting mid- September. Please call and schedule a FLU CLINIC APPOINTMENT.   Continue to take Mucinex Discuss with the neurosurgeon regarding any physical therapy Continue to drink plenty of fluids   Arrie Senate MD

## 2014-08-26 LAB — HEPATIC FUNCTION PANEL
ALBUMIN: 4.2 g/dL (ref 3.5–4.8)
ALT: 16 IU/L (ref 0–32)
AST: 15 IU/L (ref 0–40)
Alkaline Phosphatase: 120 IU/L — ABNORMAL HIGH (ref 39–117)
Bilirubin, Direct: 0.08 mg/dL (ref 0.00–0.40)
TOTAL PROTEIN: 6.4 g/dL (ref 6.0–8.5)
Total Bilirubin: 0.2 mg/dL (ref 0.0–1.2)

## 2014-08-26 LAB — NMR, LIPOPROFILE
CHOLESTEROL: 153 mg/dL (ref 100–199)
HDL Cholesterol by NMR: 64 mg/dL (ref >39)
HDL PARTICLE NUMBER: 37.9 umol/L (ref >=30.5)
LDL PARTICLE NUMBER: 957 nmol/L (ref <1000)
LDL Size: 20.4 nm (ref >20.5)
LDLC SERPL CALC-MCNC: 73 mg/dL (ref 0–99)
LP-IR Score: 33 (ref <=45)
Small LDL Particle Number: 494 nmol/L (ref <=527)
Triglycerides by NMR: 78 mg/dL (ref 0–149)

## 2014-08-26 LAB — BMP8+EGFR
BUN/Creatinine Ratio: 12 (ref 11–26)
BUN: 10 mg/dL (ref 8–27)
CO2: 25 mmol/L (ref 18–29)
Calcium: 9.4 mg/dL (ref 8.7–10.3)
Chloride: 100 mmol/L (ref 97–108)
Creatinine, Ser: 0.82 mg/dL (ref 0.57–1.00)
GFR calc non Af Amer: 71 mL/min/{1.73_m2} (ref >59)
GFR, EST AFRICAN AMERICAN: 82 mL/min/{1.73_m2} (ref >59)
GLUCOSE: 99 mg/dL (ref 65–99)
Potassium: 4.5 mmol/L (ref 3.5–5.2)
Sodium: 142 mmol/L (ref 134–144)

## 2014-08-26 LAB — VITAMIN D 25 HYDROXY (VIT D DEFICIENCY, FRACTURES): VIT D 25 HYDROXY: 36.4 ng/mL (ref 30.0–100.0)

## 2014-08-31 ENCOUNTER — Other Ambulatory Visit: Payer: Self-pay | Admitting: *Deleted

## 2014-08-31 MED ORDER — BUDESONIDE 0.5 MG/2ML IN SUSP: 0.5000 mg | Freq: Two times a day (BID) | RESPIRATORY_TRACT | Status: DC

## 2014-09-07 ENCOUNTER — Ambulatory Visit (INDEPENDENT_AMBULATORY_CARE_PROVIDER_SITE_OTHER): Payer: Medicare Other

## 2014-09-07 ENCOUNTER — Telehealth: Payer: Self-pay | Admitting: *Deleted

## 2014-09-07 DIAGNOSIS — Z23 Encounter for immunization: Secondary | ICD-10-CM

## 2014-09-07 MED ORDER — ALIGN 4 MG PO CAPS: 1.0000 | ORAL_CAPSULE | Freq: Every day | ORAL | Status: DC

## 2014-09-07 NOTE — Telephone Encounter (Signed)
Pt came in to inform she has had some GI symptoms since taking Prednisone Per Dr Laurance Flatten start taking Align Pt verbalized understanding Rx sent into [facility]

## 2014-10-06 DIAGNOSIS — H3531 Nonexudative age-related macular degeneration: Secondary | ICD-10-CM | POA: Diagnosis not present

## 2014-10-06 DIAGNOSIS — H2512 Age-related nuclear cataract, left eye: Secondary | ICD-10-CM | POA: Diagnosis not present

## 2014-10-28 ENCOUNTER — Other Ambulatory Visit: Payer: Self-pay | Admitting: Family Medicine

## 2014-10-31 ENCOUNTER — Telehealth: Payer: Self-pay | Admitting: Family Medicine

## 2014-10-31 NOTE — Telephone Encounter (Signed)
Last filled 10/04/14, last seen 08/25/14, call into [facility]

## 2014-11-01 NOTE — Telephone Encounter (Signed)
Done

## 2014-11-14 ENCOUNTER — Telehealth: Payer: Self-pay | Admitting: *Deleted

## 2014-11-14 NOTE — Telephone Encounter (Signed)
Please find out how she has been doing with her breathing and how often she had been using the budesonide nebulizer solution. It is certainly fine to continue with the albuterol. She may benefit with a Symbicort inhaler which is a long-acting bronchodilator will that has budesonide type medicine in at also. Insurance would probably pay for this.

## 2014-11-14 NOTE — Telephone Encounter (Signed)
Pt's ins will not cover budesonide for inhaler, have made numerous calls, pt asked me to forget it because she has albuterol.  Do you wish me to do anything further.  She has told me on two occasions to forget it and seems a little annoyed when I continue to persue it. Thanks!

## 2014-11-23 ENCOUNTER — Telehealth: Payer: Self-pay | Admitting: *Deleted

## 2014-11-23 NOTE — Telephone Encounter (Signed)
Pt says she is doing well with breathing and does not need or want anything else right now, I told her to let us know if she needs anything else.

## 2014-11-26 ENCOUNTER — Ambulatory Visit (INDEPENDENT_AMBULATORY_CARE_PROVIDER_SITE_OTHER): Payer: Medicare Other | Admitting: General Practice

## 2014-11-26 ENCOUNTER — Ambulatory Visit (HOSPITAL_COMMUNITY)
Admission: RE | Admit: 2014-11-26 | Discharge: 2014-11-26 | Disposition: A | Discharge status: Home / Self Care | Payer: Medicare Other | Source: Ambulatory Visit | Attending: Family Medicine | Admitting: Family Medicine

## 2014-11-26 VITALS — BP 124/83 | HR 103 | Temp 97.9°F | Ht 67.0 in | Wt 120.0 lb

## 2014-11-26 DIAGNOSIS — R1030 Lower abdominal pain, unspecified: Secondary | ICD-10-CM | POA: Diagnosis not present

## 2014-11-26 DIAGNOSIS — K5900 Constipation, unspecified: Secondary | ICD-10-CM

## 2014-11-26 DIAGNOSIS — R109 Unspecified abdominal pain: Secondary | ICD-10-CM | POA: Diagnosis not present

## 2014-11-26 NOTE — Patient Instructions (Signed)

## 2014-11-26 NOTE — Progress Notes (Signed)
   Subjective:    Patient ID: Misty [name], female    DOB: Apr 15, 1941, 74 y.o.   MRN: [id number]  HPI Patient presents today with complaints of abdominal pain and discomfort. Onset 3 weeks ago and gradually worsened over past 3 days. Reports a history of both diarrhea and constipation over the past 3 weeks. She reports taking stool softners and immodium over past week. Reports drinking very little water daily. Rates abdominal pain at 7 out of 10 and worse at times. Reports nausea, but no vomiting, fever, or bloody stools.  Reports small loose stool this morning.    Review of Systems  Constitutional: Negative for fever and chills.  Respiratory: Negative for chest tightness and shortness of breath.   Gastrointestinal: Positive for nausea, abdominal pain, diarrhea and constipation. Negative for vomiting, blood in stool and rectal pain.  Genitourinary: Negative for difficulty urinating.  Neurological: Negative for dizziness and weakness.       Objective:   Physical Exam  Constitutional: She is oriented to person, place, and time. She appears well-developed and well-nourished.  Cardiovascular: Regular rhythm and normal heart sounds.  Tachycardia present.   Pulmonary/Chest: Effort normal and breath sounds normal. No respiratory distress. She exhibits no tenderness.  Abdominal: She exhibits distension. There is tenderness.  Abdomen firm, diminished bowel sounds throughout  Neurological: She is alert and oriented to person, place, and time.  Skin: Skin is warm and dry.  Psychiatric: She has a normal mood and affect.          Assessment & Plan:  1. Abdominal pain, unspecified abdominal location, 2. Constipation, unspecified constipation type  -Patient to have xray at [facility] (reports her husband will drive her to [name]) - DG Abd 1 View; Future (call report) Patient verbalized understanding Coralie Keens, FNP-C

## 2015-01-09 ENCOUNTER — Encounter: Payer: Self-pay | Admitting: Family Medicine

## 2015-01-09 ENCOUNTER — Ambulatory Visit (INDEPENDENT_AMBULATORY_CARE_PROVIDER_SITE_OTHER): Payer: Medicare Other | Admitting: Family Medicine

## 2015-01-09 VITALS — BP 137/77 | HR 83 | Temp 97.7°F | Ht 67.0 in | Wt 126.0 lb

## 2015-01-09 DIAGNOSIS — R5383 Other fatigue: Secondary | ICD-10-CM

## 2015-01-09 DIAGNOSIS — E785 Hyperlipidemia, unspecified: Secondary | ICD-10-CM

## 2015-01-09 DIAGNOSIS — M546 Pain in thoracic spine: Secondary | ICD-10-CM

## 2015-01-09 DIAGNOSIS — R109 Unspecified abdominal pain: Secondary | ICD-10-CM

## 2015-01-09 DIAGNOSIS — Z8719 Personal history of other diseases of the digestive system: Secondary | ICD-10-CM | POA: Diagnosis not present

## 2015-01-09 DIAGNOSIS — E559 Vitamin D deficiency, unspecified: Secondary | ICD-10-CM | POA: Diagnosis not present

## 2015-01-09 DIAGNOSIS — K219 Gastro-esophageal reflux disease without esophagitis: Secondary | ICD-10-CM

## 2015-01-09 DIAGNOSIS — K5900 Constipation, unspecified: Secondary | ICD-10-CM

## 2015-01-09 LAB — POCT CBC
Granulocyte percent: 71.5 %G (ref 37–80)
HCT, POC: 39.5 % (ref 37.7–47.9)
Hemoglobin: 12 g/dL — AB (ref 12.2–16.2)
Lymph, poc: 1.4 (ref 0.6–3.4)
MCH, POC: 28.6 pg (ref 27–31.2)
MCHC: 30.4 g/dL — AB (ref 31.8–35.4)
MCV: 93.9 fL (ref 80–97)
MPV: 9.8 fL (ref 0–99.8)
POC Granulocyte: 4.1 (ref 2–6.9)
POC LYMPH PERCENT: 25 %L (ref 10–50)
Platelet Count, POC: 155 10*3/uL (ref 142–424)
RBC: 4.2 M/uL (ref 4.04–5.48)
RDW, POC: 14.4 %
WBC: 5.7 10*3/uL (ref 4.6–10.2)

## 2015-01-09 MED ORDER — ALPRAZOLAM 0.5 MG PO TABS: 0.5000 mg | ORAL_TABLET | Freq: Three times a day (TID) | ORAL | Status: DC | PRN

## 2015-01-09 NOTE — Patient Instructions (Addendum)
Medicare Annual Wellness Visit  [facility] and the medical providers at [facility] strive to bring you the best medical care.  In doing so we not only want to address your current medical conditions and concerns but also to detect new conditions early and prevent illness, disease and health-related problems.    Medicare offers a yearly Wellness Visit which allows our clinical staff to assess your need for preventative services including immunizations, lifestyle education, counseling to decrease risk of preventable diseases and screening for fall risk and other medical concerns.    This visit is provided free of charge (no copay) for all Medicare recipients. The clinical pharmacists at [facility] have begun to conduct these Wellness Visits which will also include a thorough review of all your medications.    As you primary medical provider recommend that you make an appointment for your Annual Wellness Visit if you have not done so already this year.  You may set up this appointment before you leave today or you may call back ([phone]) and schedule an appointment.  Please make sure when you call that you mention that you are scheduling your Annual Wellness Visit with the clinical pharmacist so that the appointment may be made for the proper length of time.     Continue current medications. Continue good therapeutic lifestyle changes which include good diet and exercise. Fall precautions discussed with patient. If an FOBT was given today- please return it to our front desk. If you are over 14 years old - you may need Prevnar 13 or the adult Pneumonia vaccine.  Flu Shots are still available at our office. If you still haven't had one please call to set up a nurse visit to get one.   After your visit with [place] today you will receive a survey in the mail or online from [employer] regarding your care with [place]. Please take a moment to  fill this out. Your feedback is very important to [place] as you can help [place] better understand your patient needs as well as improve your experience and satisfaction. WE CARE ABOUT YOU!!!   The patient should continue to drink plenty of fluids and use MiraLAX as needed for constipation. Because of the ongoing problems which have worsened over the past month with pain the patient will be referred to the gastroenterologist that she is seen in the past, Dr. Karilyn Cota. She should also return with fecal occult blood test card. The patient is also due for her mammogram and she will schedule this as she is leaving today.

## 2015-01-09 NOTE — Progress Notes (Signed)
Subjective:    Patient ID: Misty [name], female    DOB: 03-Aug-1941, 74 y.o.   MRN: [id number]  HPI Pt here for follow up and management of chronic medical problems which includes back pain and hyperlipidemia. She is taking medications regularly. The patient continues to have some abdominal pain. This problem with the abdominal pain has been worse over the past month and she finds herself going to the bathroom after almost anything that she eats. The stools are not watery they're more normal but she does has to go the bathroom more frequently. She is also concerned about some weight loss. She has not seen any blood in the stool or had any black tarry bowel movements. She is very concerned about the lower abdominal pain. She had an MRI of the abdomen in March 2015 and other than some hepatic cyst everything was okay. She had a recent plain film of the abdomen which showed increased stool burden. She's had back surgery and continues to have mid and low back pain following the surgery. She has to sleep on her side at night time and is very careful about lifting anything heavy. She is due to get an FOBT and is also due to get a mammogram. She complains of  fatigue also.         Patient Active Problem List   Diagnosis Date Noted  . Non-traumatic compression fracture of T11 thoracic vertebra 07/08/2014  . Hyperlipidemia 08/19/2013  . Fibrocystic breast disease 08/19/2013  . History of migraine headaches 08/19/2013  . GERD (gastroesophageal reflux disease) 04/07/2013  . Osteoporosis, postmenopausal 04/07/2013   Outpatient Encounter Prescriptions as of 01/09/2015  Medication Sig  . acetaminophen (TYLENOL) 500 MG tablet Take 500 mg by mouth every 6 (six) hours as needed for moderate pain or headache.   . ALPRAZolam (XANAX) 0.5 MG tablet TAKE ONE TABLET BY MOUTH THREE TIMES A DAY AS NEEDED FOR ANXIETY  . budesonide (PULMICORT) 0.5 MG/2ML nebulizer solution Take 2 mLs (0.5 mg total) by  nebulization 2 (two) times daily. Dx Code J20.9  . cholecalciferol (VITAMIN D) 1000 UNITS tablet Take 2,000-4,000 Units by mouth daily. Take 2000 units daily except take 4000 units on Saturday and Sunday.  . Multiple Vitamins-Minerals (CENTRUM SILVER PO) Take 1 tablet by mouth daily.  . ranitidine (ZANTAC) 150 MG tablet Take 150 mg by mouth 2 (two) times daily.   . vitamin B-12 (CYANOCOBALAMIN) 1000 MCG tablet Take 1,000 mcg by mouth daily.    Review of Systems  Constitutional: Negative.   HENT: Negative.   Eyes: Negative.   Respiratory: Negative.   Cardiovascular: Negative.   Gastrointestinal: Positive for abdominal pain (on-going).  Endocrine: Negative.   Genitourinary: Negative.   Musculoskeletal: Positive for back pain (mid- back - on-going).  Skin: Negative.   Allergic/Immunologic: Negative.   Neurological: Negative.   Hematological: Negative.   Psychiatric/Behavioral: Negative.        Objective:   Physical Exam  Constitutional: She is oriented to person, place, and time.  The patient is alert but somewhat thin and frail and kyphotic in appearance. She is stressed with dealing with her pain and dealing with her husband who has memory deficit.  HENT:  Head: Normocephalic and atraumatic.  Right Ear: External ear normal.  Left Ear: External ear normal.  Mouth/Throat: Oropharynx is clear and moist. No oropharyngeal exudate.  There is nasal congestion bilaterally  Eyes: Conjunctivae and EOM are normal. Pupils are equal, round, and reactive to light. Right eye  exhibits no discharge. Left eye exhibits no discharge. No scleral icterus.  Neck: Normal range of motion. Neck supple. No thyromegaly present.  There are no carotid bruits or anterior cervical adenopathy.  Cardiovascular: Normal rate, regular rhythm, normal heart sounds and intact distal pulses.   No murmur heard. The heart has a regular rate and rhythm at 60/m.  Pulmonary/Chest: Effort normal and breath sounds normal.  No respiratory distress. She has no wheezes. She has no rales. She exhibits no tenderness.  Abdominal: Soft. Bowel sounds are normal. She exhibits no mass. There is no tenderness. There is no rebound and no guarding.  There is no abdominal mass palpated and she has lower generalized abdominal tenderness. There is no inguinal adenopathy.  Musculoskeletal: She exhibits no edema or tenderness.  The patient's range of movement is hesitant because of her previous back surgeries. Leg raising was good bilaterally.  Lymphadenopathy:    She has no cervical adenopathy.  Neurological: She is alert and oriented to person, place, and time. She has normal reflexes. No cranial nerve deficit.  Skin: Skin is warm and dry. No rash noted.  Psychiatric: She has a normal mood and affect. Her behavior is normal. Judgment and thought content normal.  Nursing note and vitals reviewed.  BP 137/77 mmHg  Pulse 83  Temp(Src) 97.7 F (36.5 C) (Oral)  Ht $R'5\' 7"'lF$  (1.702 m)  Wt 126 lb (57.153 kg)  BMI 19.73 kg/m2        Assessment & Plan:  1. Gastroesophageal reflux disease, esophagitis presence not specified -This is stable and the patient is having no problems with reflux at the current time. The Zantac is controlling her reflux symptoms. - POCT CBC - NMR, lipoprofile - Hepatic function panel - Ambulatory referral to Gastroenterology  2. Hyperlipidemia -The patient is doing as well as she can with her diet and we will look at her cholesterol numbers when they're returned on the lab work being drawn today and make any adjustments by adding medication if necessary. - POCT CBC - BMP8+EGFR - NMR, lipoprofile - Hepatic function panel  3. Vitamin D deficiency -She should continue with her vitamin D3 1000 as she is doing now and any adjustments will be made once lab work is reviewed - POCT CBC - Vit D  25 hydroxy (rtn osteoporosis monitoring)  4. Midline thoracic back pain -Continue to be careful with lifting  and if any worsening of pain get back in touch with Dr. Sherwood Gambler. - POCT CBC  5. Abdominal pain, unspecified abdominal location -Because of the change in bowel habits and the increased abdominal pain, the patient will be given a referral to the gastroenterologist for further evaluation. - POCT CBC - BMP8+EGFR - Hepatic function panel - Ambulatory referral to Gastroenterology  6. Other fatigue -A lot of the fatigue may be more from depression and anxiety but we will make sure with the lab work that nothing else is playing a role in this. - POCT CBC - Vit D  25 hydroxy (rtn osteoporosis monitoring) - Thyroid Panel With TSH  7. History of IBS -Refer to gastroenterology - Ambulatory referral to Gastroenterology  8. Constipation, unspecified constipation type -Refer to GI for further evaluation - Ambulatory referral to Gastroenterology  Meds ordered this encounter  Medications  . DISCONTD: ALPRAZolam (XANAX) 0.5 MG tablet    Sig: Take 1 tablet (0.5 mg total) by mouth 3 (three) times daily as needed. for anxiety    Dispense:  90 tablet    Refill:  2  . ALPRAZolam (XANAX) 0.5 MG tablet    Sig: Take 1 tablet (0.5 mg total) by mouth 3 (three) times daily as needed. for anxiety    Dispense:  90 tablet    Refill:  3   Patient Instructions                       Medicare Annual Wellness Visit  [facility] and the medical providers at [facility] strive to bring you the best medical care.  In doing so we not only want to address your current medical conditions and concerns but also to detect new conditions early and prevent illness, disease and health-related problems.    Medicare offers a yearly Wellness Visit which allows our clinical staff to assess your need for preventative services including immunizations, lifestyle education, counseling to decrease risk of preventable diseases and screening for fall risk and other medical concerns.    This visit is provided  free of charge (no copay) for all Medicare recipients. The clinical pharmacists at [facility] have begun to conduct these Wellness Visits which will also include a thorough review of all your medications.    As you primary medical provider recommend that you make an appointment for your Annual Wellness Visit if you have not done so already this year.  You may set up this appointment before you leave today or you may call back ([phone]) and schedule an appointment.  Please make sure when you call that you mention that you are scheduling your Annual Wellness Visit with the clinical pharmacist so that the appointment may be made for the proper length of time.     Continue current medications. Continue good therapeutic lifestyle changes which include good diet and exercise. Fall precautions discussed with patient. If an FOBT was given today- please return it to our front desk. If you are over 46 years old - you may need Prevnar 15 or the adult Pneumonia vaccine.  Flu Shots are still available at our office. If you still haven't had one please call to set up a nurse visit to get one.   After your visit with [place] today you will receive a survey in the mail or online from [employer] regarding your care with [place]. Please take a moment to fill this out. Your feedback is very important to [place] as you can help [place] better understand your patient needs as well as improve your experience and satisfaction. WE CARE ABOUT YOU!!!   The patient should continue to drink plenty of fluids and use MiraLAX as needed for constipation. Because of the ongoing problems which have worsened over the past month with pain the patient will be referred to the gastroenterologist that she is seen in the past, Dr. Laural Golden. She should also return with fecal occult blood test card. The patient is also due for her mammogram and she will schedule this as she is leaving today.   Arrie Senate MD

## 2015-01-10 ENCOUNTER — Telehealth: Payer: Self-pay | Admitting: Family Medicine

## 2015-01-10 LAB — NMR, LIPOPROFILE
Cholesterol: 137 mg/dL (ref 100–199)
HDL Cholesterol by NMR: 52 mg/dL (ref >39)
HDL Particle Number: 31.1 umol/L (ref >=30.5)
LDL PARTICLE NUMBER: 1020 nmol/L — AB (ref <1000)
LDL SIZE: 20.2 nm (ref >20.5)
LDL-C: 71 mg/dL (ref 0–99)
LP-IR SCORE: 36 (ref <=45)
Small LDL Particle Number: 704 nmol/L — ABNORMAL HIGH (ref <=527)
Triglycerides by NMR: 72 mg/dL (ref 0–149)

## 2015-01-10 LAB — BMP8+EGFR
BUN / CREAT RATIO: 11 (ref 11–26)
BUN: 9 mg/dL (ref 8–27)
CO2: 26 mmol/L (ref 18–29)
Calcium: 8.9 mg/dL (ref 8.7–10.3)
Chloride: 104 mmol/L (ref 97–108)
Creatinine, Ser: 0.83 mg/dL (ref 0.57–1.00)
GFR calc Af Amer: 81 mL/min/{1.73_m2} (ref >59)
GFR calc non Af Amer: 70 mL/min/{1.73_m2} (ref >59)
Glucose: 116 mg/dL — ABNORMAL HIGH (ref 65–99)
POTASSIUM: 4.1 mmol/L (ref 3.5–5.2)
SODIUM: 143 mmol/L (ref 134–144)

## 2015-01-10 LAB — HEPATIC FUNCTION PANEL
ALT: 17 IU/L (ref 0–32)
AST: 23 IU/L (ref 0–40)
Albumin: 3.9 g/dL (ref 3.5–4.8)
Alkaline Phosphatase: 117 IU/L (ref 39–117)
Bilirubin Total: <0.2 mg/dL (ref 0.0–1.2)
Bilirubin, Direct: 0.1 mg/dL (ref 0.00–0.40)
Total Protein: 6.1 g/dL (ref 6.0–8.5)

## 2015-01-10 LAB — THYROID PANEL WITH TSH
Free Thyroxine Index: 2.3 (ref 1.2–4.9)
T3 Uptake Ratio: 28 % (ref 24–39)
T4 TOTAL: 8.3 ug/dL (ref 4.5–12.0)
TSH: 1.88 u[IU]/mL (ref 0.450–4.500)

## 2015-01-10 LAB — VITAMIN D 25 HYDROXY (VIT D DEFICIENCY, FRACTURES): VIT D 25 HYDROXY: 36.5 ng/mL (ref 30.0–100.0)

## 2015-01-10 NOTE — Telephone Encounter (Signed)
-----   Message from Chipper Herb, MD sent at 01/10/2015  2:03 PM EST ----- The blood sugar is elevated at 116. The creatinine the most important kidney function test is within normal limits. The electrolytes including potassium are within normal limits. Cholesterol numbers with advanced lipid testing are slightly elevated above the goal of 1000 at 1020. The LDL C is good at 71. The triglycerides are good at 72. The HDL particle number or the good cholesterol is good. Please encourage the patient to do better with her diet habits eating more vegetables and getting as much physical activity as possible All liver function tests are within normal limits The vitamin D level is good at 36.5, continue current treatment All thyroid function tests are within normal limits-----no thyroid treatment is necessary.

## 2015-01-11 ENCOUNTER — Encounter (INDEPENDENT_AMBULATORY_CARE_PROVIDER_SITE_OTHER): Payer: Self-pay | Admitting: *Deleted

## 2015-01-11 NOTE — Telephone Encounter (Signed)
Patient aware.

## 2015-02-08 ENCOUNTER — Encounter (INDEPENDENT_AMBULATORY_CARE_PROVIDER_SITE_OTHER): Payer: Self-pay | Admitting: Internal Medicine

## 2015-02-08 ENCOUNTER — Telehealth (INDEPENDENT_AMBULATORY_CARE_PROVIDER_SITE_OTHER): Payer: Self-pay | Admitting: *Deleted

## 2015-02-08 ENCOUNTER — Other Ambulatory Visit (INDEPENDENT_AMBULATORY_CARE_PROVIDER_SITE_OTHER): Payer: Self-pay | Admitting: *Deleted

## 2015-02-08 ENCOUNTER — Ambulatory Visit (INDEPENDENT_AMBULATORY_CARE_PROVIDER_SITE_OTHER): Payer: Medicare Other | Admitting: Internal Medicine

## 2015-02-08 VITALS — BP 104/52 | HR 84 | Temp 97.7°F | Ht 67.0 in | Wt 124.0 lb

## 2015-02-08 DIAGNOSIS — K5909 Other constipation: Secondary | ICD-10-CM

## 2015-02-08 DIAGNOSIS — R195 Other fecal abnormalities: Secondary | ICD-10-CM

## 2015-02-08 DIAGNOSIS — Z1211 Encounter for screening for malignant neoplasm of colon: Secondary | ICD-10-CM

## 2015-02-08 NOTE — Telephone Encounter (Signed)
Patient needs trilyte 

## 2015-02-08 NOTE — Patient Instructions (Signed)
Miralax 1 scoop daily Colonoscopy. The risks and benefits such as perforation, bleeding, and infection were reviewed with the patient and is agreeable.

## 2015-02-08 NOTE — Progress Notes (Addendum)
Subjective:    Patient ID: Misty [name], female    DOB: August 22, 1941, 74 y.o.   MRN: [id number]  HPI Referred to our office by Dr. Redge Gainer with "stomach problems". She tells me she has had 2 back surgeries in the past 13 months.     On average she usually has a BM about once a week.  Hf she is constipated, she may see some blood in her panties. Lower abdominal pain associated with her BMs.  When she does have a BM it is very small.  She may go 2 weeks without having a BM. She uses Miralax and  prune juice as needed. She has been constipated for over 3 months. She says her stool are smaller. She says she has lost about 20 pounds over the past 2 years.  .   No recent pain medications. No NSAIDs. She says her appetite is not good. She says she does not drink a lot of fluid. Her last colonoscopy was in 2002 by Dr. Velora Heckler and was normal per patient.    Weight today 124 04/08/2013 Weight 131  01/31/2014 MRI abdomen/w/wo: weight loss  IMPRESSION: Numerous scattered benign hepatic cysts.  Focal area of scarring change involving the medial segment of the left hepatic lobe near the falciform ligament with capsular retraction and a small defect in the liver. No worrisome hepatic lesions.  High T1 material in the gallbladder is most likely cholesterol laden bile.   CBC    Component Value Date/Time   WBC 5.7 01/09/2015 0954   WBC 6.6 07/06/2014 1406   RBC 4.20 01/09/2015 0954   RBC 4.16 07/06/2014 1406   HGB 12.0* 01/09/2015 0954   HGB 12.8 07/06/2014 1406   HCT 39.5 01/09/2015 0954   HCT 39.5 07/06/2014 1406   PLT 205 07/06/2014 1406   MCV 93.9 01/09/2015 0954   MCV 95.0 07/06/2014 1406   MCH 28.6 01/09/2015 0954   MCH 30.8 07/06/2014 1406   MCHC 30.4* 01/09/2015 0954   MCHC 32.4 07/06/2014 1406   RDW 13.4 07/06/2014 1406   BMET    Component Value Date/Time   NA 143 01/09/2015 0948   NA 143 07/06/2014 1406   K 4.1 01/09/2015 0948   CL 104 01/09/2015 0948   CO2  26 01/09/2015 0948   GLUCOSE 116* 01/09/2015 0948   GLUCOSE 102* 07/06/2014 1406   BUN 9 01/09/2015 0948   BUN 10 07/06/2014 1406   CREATININE 0.83 01/09/2015 0948   CALCIUM 8.9 01/09/2015 0948   GFRNONAA 70 01/09/2015 0948   GFRAA 81 01/09/2015 0948       1/9/016 Abdominal film: abdominal pain  IMPRESSION: Moderate to large colonic stool burden, suggesting constipation.  Review of Systems Married. One child in good health.     Past Medical History  Diagnosis Date  . Migraines   . Osteoporosis   . Hyperlipidemia   . PONV (postoperative nausea and vomiting)   . History of bronchitis   . History of migraine     many yrs ago  . Numbness     left leg  . Chronic back pain   . Arthritis     back  . GERD (gastroesophageal reflux disease)     takes Ranidine daily  . Constipation     OTC stool softener prn  . Hemorrhoids   . Diverticulosis   . Urinary frequency   . Urinary urgency   . Nocturia   . Depression   . Anxiety  takes Xanax daily  . Insomnia   . History of shingles   . Cataract   . COPD (chronic obstructive pulmonary disease)     Past Surgical History  Procedure Laterality Date  . Abdominal hysterectomy    . Esophagogastroduodenoscopy    . Colonoscopy    . Lumbar laminectomy/decompression microdiscectomy Bilateral 05/20/2013    Procedure: LUMBAR LAMINECTOMY/DECOMPRESSION MICRODISCECTOMY 1 LEVEL;  Surgeon: Hosie Spangle, MD;  Location: [place] [facility];  Service: Neurosurgery;  Laterality: Bilateral;  Bilateral Lumbar four-five laminotomy and left lumbar four-five microdiskectomy  . Eye surgery Right 3/15    cataracts  . Kyphoplasty N/A 07/08/2014    Procedure: Thoracic Eleven Kyphoplasty;  Surgeon: Hosie Spangle, MD;  Location: [place] [facility];  Service: Neurosurgery;  Laterality: N/A;    Allergies  Allergen Reactions  . Codeine Nausea And Vomiting  . Asa [Aspirin] Other (See Comments)    Patient is unaware of allergy  . Azithromycin Other (See  Comments)    Patient is unaware of allergy  . Celebrex [Celecoxib] Other (See Comments)    Irritated stomach   . Cymbalta [Duloxetine Hcl] Other (See Comments)    Felt groggy  . Prednisone Rash    She has seen the podiatrist and he had injected cortisone in her feet and she had also taken a peel at home. She had a severe reaction to her face with a rash and had to take Benadryl to resolve the symptom. She actually did get more cortisone shots, but no additional reactions to the shots.  . Vioxx [Rofecoxib] Other (See Comments)    Unknown  . Zelnorm [Tegaserod] Other (See Comments)    Patient is unaware of allergy  . Zocor [Simvastatin] Other (See Comments)    Patient is unaware of allergy    Current Outpatient Prescriptions on File Prior to Visit  Medication Sig Dispense Refill  . acetaminophen (TYLENOL) 500 MG tablet Take 500 mg by mouth every 6 (six) hours as needed for moderate pain or headache.     . ALPRAZolam (XANAX) 0.5 MG tablet Take 1 tablet (0.5 mg total) by mouth 3 (three) times daily as needed. for anxiety 90 tablet 3  . cholecalciferol (VITAMIN D) 1000 UNITS tablet Take 2,000-4,000 Units by mouth daily. Take 2000 units daily except take 4000 units on Saturday and Sunday.    . Multiple Vitamins-Minerals (CENTRUM SILVER PO) Take 1 tablet by mouth daily.    . ranitidine (ZANTAC) 150 MG tablet Take 150 mg by mouth 2 (two) times daily.     . vitamin B-12 (CYANOCOBALAMIN) 1000 MCG tablet Take 1,000 mcg by mouth daily.     No current facility-administered medications on file prior to visit.     Objective:   Physical Exam Blood pressure 104/52, pulse 84, temperature 97.7 F (36.5 C), height 5' 7"  (1.702 m), weight 124 lb (56.246 kg). Alert and oriented. Skin warm and dry. Oral mucosa is moist.   . Sclera anicteric, conjunctivae is pink. Thyroid not enlarged. No cervical lymphadenopathy. Lungs clear. Heart regular rate and rhythm.  Abdomen is soft. Bowel sounds are positive. No  hepatomegaly. No abdominal masses felt. No tenderness.  No edema to lower extremities.  Stool brown and guaiac positive. Stool soft in rectum.     Developer: [id number], Exp 9-17 Card: Lot 02-14, Exp 07.18    Assessment & Plan:  Change in stool, weight loss. Guaiac positive stool.  Colonic neoplasm needs to be ruled out. Polyp, AVM, hemorrhoid in the differential. MIralax  1 scoop daily.The risks and benefits such as perforation, bleeding, and infection were reviewed with the patient and is agreeable.

## 2015-02-13 MED ORDER — PEG 3350-KCL-NA BICARB-NACL 420 G PO SOLR: 4000.0000 mL | Freq: Once | ORAL | Status: DC

## 2015-02-17 ENCOUNTER — Ambulatory Visit (HOSPITAL_COMMUNITY)
Admission: RE | Admit: 2015-02-17 | Discharge: 2015-02-17 | Disposition: A | Discharge status: Home / Self Care | Payer: Medicare Other | Source: Ambulatory Visit | Attending: Internal Medicine | Admitting: Internal Medicine

## 2015-02-17 ENCOUNTER — Encounter (HOSPITAL_COMMUNITY): Payer: Self-pay | Admitting: *Deleted

## 2015-02-17 ENCOUNTER — Encounter (HOSPITAL_COMMUNITY)
Admission: RE | Disposition: A | Discharge status: Home / Self Care | Payer: Self-pay | Source: Ambulatory Visit | Attending: Internal Medicine

## 2015-02-17 DIAGNOSIS — K5730 Diverticulosis of large intestine without perforation or abscess without bleeding: Secondary | ICD-10-CM | POA: Insufficient documentation

## 2015-02-17 DIAGNOSIS — K6389 Other specified diseases of intestine: Secondary | ICD-10-CM | POA: Insufficient documentation

## 2015-02-17 DIAGNOSIS — Z886 Allergy status to analgesic agent status: Secondary | ICD-10-CM | POA: Diagnosis not present

## 2015-02-17 DIAGNOSIS — E785 Hyperlipidemia, unspecified: Secondary | ICD-10-CM | POA: Insufficient documentation

## 2015-02-17 DIAGNOSIS — R195 Other fecal abnormalities: Secondary | ICD-10-CM

## 2015-02-17 DIAGNOSIS — Z888 Allergy status to other drugs, medicaments and biological substances status: Secondary | ICD-10-CM | POA: Diagnosis not present

## 2015-02-17 DIAGNOSIS — K219 Gastro-esophageal reflux disease without esophagitis: Secondary | ICD-10-CM | POA: Insufficient documentation

## 2015-02-17 DIAGNOSIS — Z881 Allergy status to other antibiotic agents status: Secondary | ICD-10-CM | POA: Diagnosis not present

## 2015-02-17 DIAGNOSIS — J449 Chronic obstructive pulmonary disease, unspecified: Secondary | ICD-10-CM | POA: Insufficient documentation

## 2015-02-17 DIAGNOSIS — K5900 Constipation, unspecified: Secondary | ICD-10-CM | POA: Insufficient documentation

## 2015-02-17 DIAGNOSIS — G4700 Insomnia, unspecified: Secondary | ICD-10-CM | POA: Diagnosis not present

## 2015-02-17 DIAGNOSIS — M810 Age-related osteoporosis without current pathological fracture: Secondary | ICD-10-CM | POA: Diagnosis not present

## 2015-02-17 DIAGNOSIS — F329 Major depressive disorder, single episode, unspecified: Secondary | ICD-10-CM | POA: Diagnosis not present

## 2015-02-17 DIAGNOSIS — F419 Anxiety disorder, unspecified: Secondary | ICD-10-CM | POA: Insufficient documentation

## 2015-02-17 DIAGNOSIS — K5909 Other constipation: Secondary | ICD-10-CM

## 2015-02-17 DIAGNOSIS — K921 Melena: Secondary | ICD-10-CM | POA: Diagnosis present

## 2015-02-17 DIAGNOSIS — Z90710 Acquired absence of both cervix and uterus: Secondary | ICD-10-CM | POA: Diagnosis not present

## 2015-02-17 DIAGNOSIS — K648 Other hemorrhoids: Secondary | ICD-10-CM | POA: Insufficient documentation

## 2015-02-17 DIAGNOSIS — K529 Noninfective gastroenteritis and colitis, unspecified: Secondary | ICD-10-CM | POA: Diagnosis not present

## 2015-02-17 DIAGNOSIS — K649 Unspecified hemorrhoids: Secondary | ICD-10-CM | POA: Diagnosis not present

## 2015-02-17 DIAGNOSIS — K599 Functional intestinal disorder, unspecified: Secondary | ICD-10-CM | POA: Diagnosis not present

## 2015-02-17 HISTORY — PX: COLONOSCOPY: SHX5424

## 2015-02-17 SURGERY — COLONOSCOPY
Anesthesia: Moderate Sedation

## 2015-02-17 MED ORDER — MIDAZOLAM HCL 5 MG/5ML IJ SOLN
INTRAMUSCULAR | Status: AC
  Filled 2015-02-17: qty 10

## 2015-02-17 MED ORDER — SODIUM CHLORIDE 0.9 % IV SOLN
INTRAVENOUS | Status: DC
  Administered 2015-02-17: 11:00:00 via INTRAVENOUS

## 2015-02-17 MED ORDER — STERILE WATER FOR IRRIGATION IR SOLN
Status: DC | PRN
  Administered 2015-02-17: 11:00:00

## 2015-02-17 MED ORDER — POLYETHYLENE GLYCOL 3350 17 GM/SCOOP PO POWD: 17.0000 g | Freq: Every day | ORAL | Status: DC

## 2015-02-17 MED ORDER — MEPERIDINE HCL 50 MG/ML IJ SOLN
INTRAMUSCULAR | Status: AC
  Filled 2015-02-17: qty 1

## 2015-02-17 MED ORDER — MEPERIDINE HCL 50 MG/ML IJ SOLN
INTRAMUSCULAR | Status: DC | PRN
  Administered 2015-02-17 (×2): 25 mg via INTRAVENOUS

## 2015-02-17 MED ORDER — MIDAZOLAM HCL 5 MG/5ML IJ SOLN
INTRAMUSCULAR | Status: DC | PRN
  Administered 2015-02-17 (×5): 2 mg via INTRAVENOUS

## 2015-02-17 MED ORDER — BENEFIBER PO POWD: 4.0000 g | Freq: Every day | ORAL | Status: DC

## 2015-02-17 NOTE — Op Note (Signed)
COLONOSCOPY PROCEDURE REPORT  PATIENT:  Misty [name]  MR#:  [id number] Birthdate:  [birth year], 74 y.o., female Endoscopist:  Dr. Rogene Houston, MD Referred By:  Dr. Redge Gainer, MD  Procedure Date: 02/17/2015  Procedure:   Colonoscopy  Indications:  Patient is 74 year old Caucasian female with multiple medical problems who presents with over 6 months history of constipation and she has intermittent hematochezia felt to be secondary to hemorrhoids. She was recently seen in the office and noted to have heme-positive stool. Last colonoscopy was in 2002.  Informed Consent:  The procedure and risks were reviewed with the patient and informed consent was obtained.  Medications:  Demerol 50 mg IV Versed 10 mg IV  Description of procedure:  After a digital rectal exam was performed, that colonoscope was advanced from the anus through the rectum and colon to the area of the cecum, ileocecal valve and appendiceal orifice. The cecum was deeply intubated. These structures were well-seen and photographed for the record. From the level of the cecum and ileocecal valve, the scope was slowly and cautiously withdrawn. The mucosal surfaces were carefully surveyed utilizing scope tip to flexion to facilitate fold flattening as needed. The scope was pulled down into the rectum where a thorough exam including retroflexion was performed.  Findings:   Prep satisfactory. Focal edema and erythema with linear erosions noted at proximal sigmoid colon. Biopsy taken from this area. Scattered diverticula at sigmoid colon. Normal rectal mucosa. Small hemorrhoids below the dentate line.   Therapeutic/Diagnostic Maneuvers Performed:  See above  Complications:  None  Cecal Withdrawal Time:  10 minutes  Impression:  Examination performed to cecum. Focal colitis at proximal sigmoid colon which appears to be resolving colitis probably ischemic. Biopsies taken. Mild sigmoid colon diverticulosis. No evidence of  colonic stricture. Small external hemorrhoids.  Recommendations:  Standard instructions given. Polyethylene glycol 17 g by mouth daily at bedtime. Benefiber 4 g by mouth daily at bedtime. I will contact patient with biopsy results and further recommendations. Office visit in 8 weeks.  Toniesha Zellner U  02/17/2015 12:06 PM  CC: Dr. Redge Gainer, MD & Dr. Rayne Du ref. provider found

## 2015-02-17 NOTE — H&P (Signed)
Misty [name] is an 74 y.o. female.   Chief Complaint: Patient is here for colonoscopy. HPI: Patient is 74 year old Caucasian female with multiple medical problems who presents with 6 month history of constipation. She goes 1-2 weeks without bowel movement. She's tried prune juice stool Soffer without any benefit. She also has poor appetite and has lost some weight. She says she's had 3 surgeries in the last year and a half including 2 back surgery and she has been under a lot of stress since she lost her dad. She states hemorrhoids come out when she straining. She has occasional hematochezia. She was seen in the office recently and noted having positive stool. Last colonoscopy was in 2002. Family history is negative for CRC.  Past Medical History  Diagnosis Date  . Migraines   . Osteoporosis   . Hyperlipidemia   . PONV (postoperative nausea and vomiting)   . History of bronchitis   . History of migraine     many yrs ago  . Numbness     left leg  . Chronic back pain   . Arthritis     back  . GERD (gastroesophageal reflux disease)     takes Ranidine daily  . Constipation     OTC stool softener prn  . Hemorrhoids   . Diverticulosis   . Urinary frequency   . Urinary urgency   . Nocturia   . Depression   . Anxiety     takes Xanax daily  . Insomnia   . History of shingles   . Cataract   . COPD (chronic obstructive pulmonary disease)     Past Surgical History  Procedure Laterality Date  . Abdominal hysterectomy    . Esophagogastroduodenoscopy    . Colonoscopy    . Lumbar laminectomy/decompression microdiscectomy Bilateral 05/20/2013    Procedure: LUMBAR LAMINECTOMY/DECOMPRESSION MICRODISCECTOMY 1 LEVEL;  Surgeon: Hosie Spangle, MD;  Location: [place] [facility];  Service: Neurosurgery;  Laterality: Bilateral;  Bilateral Lumbar four-five laminotomy and left lumbar four-five microdiskectomy  . Eye surgery Right 3/15    cataracts  . Kyphoplasty N/A 07/08/2014    Procedure:  Thoracic Eleven Kyphoplasty;  Surgeon: Hosie Spangle, MD;  Location: [place] [facility];  Service: Neurosurgery;  Laterality: N/A;    Family History  Problem Relation Age of Onset  . Hypertension Mother   . Colon cancer Neg Hx    Social History:  reports that she has been smoking Cigarettes.  She started smoking about 56 years ago. She has a 52 pack-year smoking history. She has never used smokeless tobacco. She reports that she does not drink alcohol or use illicit drugs.  Allergies:  Allergies  Allergen Reactions  . Codeine Nausea And Vomiting  . Asa [Aspirin] Other (See Comments)    Patient is unaware of allergy  . Azithromycin Other (See Comments)    Patient is unaware of allergy  . Celebrex [Celecoxib] Other (See Comments)    Irritated stomach   . Cymbalta [Duloxetine Hcl] Other (See Comments)    Felt groggy  . Prednisone Rash    She has seen the podiatrist and he had injected cortisone in her feet and she had also taken a peel at home. She had a severe reaction to her face with a rash and had to take Benadryl to resolve the symptom. She actually did get more cortisone shots, but no additional reactions to the shots.  . Vioxx [Rofecoxib] Other (See Comments)    Unknown  . Zelnorm [Tegaserod]  Other (See Comments)    Patient is unaware of allergy  . Zocor [Simvastatin] Other (See Comments)    Patient is unaware of allergy    Medications Prior to Admission  Medication Sig Dispense Refill  . acetaminophen (TYLENOL) 500 MG tablet Take 500 mg by mouth every 6 (six) hours as needed for moderate pain or headache.     . ALPRAZolam (XANAX) 0.5 MG tablet Take 1 tablet (0.5 mg total) by mouth 3 (three) times daily as needed. for anxiety 90 tablet 3  . cholecalciferol (VITAMIN D) 1000 UNITS tablet Take 2,000-4,000 Units by mouth daily. Take 2000 units daily except take 4000 units on Saturday and Sunday.    . Multiple Vitamins-Minerals (CENTRUM SILVER PO) Take 1 tablet by mouth daily.     . polyethylene glycol-electrolytes (NULYTELY/GOLYTELY) 420 G solution Take 4,000 mLs by mouth once. 4000 mL 0  . ranitidine (ZANTAC) 150 MG tablet Take 150 mg by mouth 2 (two) times daily.     . vitamin B-12 (CYANOCOBALAMIN) 1000 MCG tablet Take 1,000 mcg by mouth daily.      No results found for this or any previous visit (from the past 48 hour(s)). No results found.  ROS  Blood pressure 117/57, pulse 83, temperature 97.7 F (36.5 C), temperature source Oral, resp. rate 14, height 5' 7"  (1.702 m), weight 124 lb (56.246 kg), SpO2 97 %. Physical Exam  Constitutional:  Well-developed thin Caucasian female in NAD  HENT:  Mouth/Throat: Oropharynx is clear and moist.  Eyes: Conjunctivae are normal. No scleral icterus.  Neck: No thyromegaly present.  Cardiovascular: Normal rate, regular rhythm and normal heart sounds.   No murmur heard. Respiratory: Effort normal and breath sounds normal.  GI: Soft. She exhibits no mass. There is no tenderness.  Musculoskeletal: She exhibits no edema.  Lymphadenopathy:    She has no cervical adenopathy.  Neurological: She is alert.  Skin: Skin is warm and dry.     Assessment/Plan Constipation and heme positive stool. Diagnostic colonoscopy.  REHMAN,NAJEEB U 02/17/2015, 11:17 AM

## 2015-02-17 NOTE — Discharge Instructions (Signed)
Resume usual medications. High fiber diet. Benefiber 4 g by mouth daily at bedtime. Stool diary as to frequency and consistency of stools for next 8 weeks. No driving for 24 hours. Physician will call with biopsy results. Office visit in 8 weeks.  Colonoscopy, Care After Refer to this sheet in the next few weeks. These instructions provide you with information on caring for yourself after your procedure. Your health care provider may also give you more specific instructions. Your treatment has been planned according to current medical practices, but problems sometimes occur. Call your health care provider if you have any problems or questions after your procedure. WHAT TO EXPECT AFTER THE PROCEDURE  After your procedure, it is typical to have the following:  A small amount of blood in your stool.  Moderate amounts of gas and mild abdominal cramping or bloating. HOME CARE INSTRUCTIONS  Do not drive, operate machinery, or sign important documents for 24 hours.  You may shower and resume your regular physical activities, but move at a slower pace for the first 24 hours.  Take frequent rest periods for the first 24 hours.  Walk around or put a warm pack on your abdomen to help reduce abdominal cramping and bloating.  Drink enough fluids to keep your urine clear or pale yellow.  You may resume your normal diet as instructed by your health care provider. Avoid heavy or fried foods that are hard to digest.  Avoid drinking alcohol for 24 hours or as instructed by your health care provider.  Only take over-the-counter or prescription medicines as directed by your health care provider.  If a tissue sample (biopsy) was taken during your procedure:  Do not take aspirin or blood thinners for 7 days, or as instructed by your health care provider.  Do not drink alcohol for 7 days, or as instructed by your health care provider.  Eat soft foods for the first 24 hours. SEEK MEDICAL CARE  IF: You have persistent spotting of blood in your stool 2-3 days after the procedure. SEEK IMMEDIATE MEDICAL CARE IF:  You have more than a small spotting of blood in your stool.  You pass large blood clots in your stool.  Your abdomen is swollen (distended).  You have nausea or vomiting.  You have a fever.  You have increasing abdominal pain that is not relieved with medicine. Document Released: 06/18/2004 Document Revised: 08/25/2013 Document Reviewed: 07/12/2013 [facility] Patient Information 2015 [place], [place]. This information is not intended to replace advice given to you by your health care provider. Make sure you discuss any questions you have with your health care provider.   High-Fiber Diet Fiber is found in fruits, vegetables, and grains. A high-fiber diet encourages the addition of more whole grains, legumes, fruits, and vegetables in your diet. The recommended amount of fiber for adult males is 38 g per day. For adult females, it is 25 g per day. Pregnant and lactating women should get 28 g of fiber per day. If you have a digestive or bowel problem, ask your caregiver for advice before adding high-fiber foods to your diet. Eat a variety of high-fiber foods instead of only a select few type of foods.  PURPOSE  To increase stool bulk.  To make bowel movements more regular to prevent constipation.  To lower cholesterol.  To prevent overeating. WHEN IS THIS DIET USED?  It may be used if you have constipation and hemorrhoids.  It may be used if you have uncomplicated diverticulosis (intestine  condition) and irritable bowel syndrome.  It may be used if you need help with weight management.  It may be used if you want to add it to your diet as a protective measure against atherosclerosis, diabetes, and cancer. SOURCES OF FIBER  Whole-grain breads and cereals.  Fruits, such as apples, oranges, bananas, berries, prunes, and pears.  Vegetables, such as green peas,  carrots, sweet potatoes, beets, broccoli, cabbage, spinach, and artichokes.  Legumes, such split peas, soy, lentils.  Almonds. FIBER CONTENT IN FOODS Starches and Grains / Dietary Fiber (g)  Cheerios, 1 cup / 3 g  Corn Flakes cereal, 1 cup / 0.7 g  Rice crispy treat cereal, 1 cup / 0.3 g  Instant oatmeal (cooked),  cup / 2 g  Frosted wheat cereal, 1 cup / 5.1 g  Brown, long-grain rice (cooked), 1 cup / 3.5 g  White, long-grain rice (cooked), 1 cup / 0.6 g  Enriched macaroni (cooked), 1 cup / 2.5 g Legumes / Dietary Fiber (g)  Baked beans (canned, plain, or vegetarian),  cup / 5.2 g  Kidney beans (canned),  cup / 6.8 g  Pinto beans (cooked),  cup / 5.5 g Breads and Crackers / Dietary Fiber (g)  Plain or honey graham crackers, 2 squares / 0.7 g  Saltine crackers, 3 squares / 0.3 g  Plain, salted pretzels, 10 pieces / 1.8 g  Whole-wheat bread, 1 slice / 1.9 g  White bread, 1 slice / 0.7 g  Raisin bread, 1 slice / 1.2 g  Plain bagel, 3 oz / 2 g  Flour tortilla, 1 oz / 0.9 g  Corn tortilla, 1 small / 1.5 g  Hamburger or hotdog bun, 1 small / 0.9 g Fruits / Dietary Fiber (g)  Apple with skin, 1 medium / 4.4 g  Sweetened applesauce,  cup / 1.5 g  Banana,  medium / 1.5 g  Grapes, 10 grapes / 0.4 g  Orange, 1 small / 2.3 g  Raisin, 1.5 oz / 1.6 g  Melon, 1 cup / 1.4 g Vegetables / Dietary Fiber (g)  Green beans (canned),  cup / 1.3 g  Carrots (cooked),  cup / 2.3 g  Broccoli (cooked),  cup / 2.8 g  Peas (cooked),  cup / 4.4 g  Mashed potatoes,  cup / 1.6 g  Lettuce, 1 cup / 0.5 g  Corn (canned),  cup / 1.6 g  Tomato,  cup / 1.1 g Document Released: 11/04/2005 Document Revised: 05/05/2012 Document Reviewed: 02/06/2012 [employer] Patient Information 2015 [place], [place]. This information is not intended to replace advice given to you by your health care provider. Make sure you discuss any questions you have with your health care  provider.

## 2015-02-20 ENCOUNTER — Encounter (HOSPITAL_COMMUNITY): Payer: Self-pay | Admitting: Internal Medicine

## 2015-02-26 ENCOUNTER — Encounter (HOSPITAL_COMMUNITY): Payer: Self-pay | Admitting: Emergency Medicine

## 2015-02-26 ENCOUNTER — Emergency Department (HOSPITAL_COMMUNITY): Payer: Medicare Other

## 2015-02-26 ENCOUNTER — Inpatient Hospital Stay (HOSPITAL_COMMUNITY)
Admission: EM | Admit: 2015-02-26 | Discharge: 2015-02-28 | DRG: 392 | Disposition: A | Discharge status: Home / Self Care | Payer: Medicare Other | Attending: Internal Medicine | Admitting: Internal Medicine

## 2015-02-26 DIAGNOSIS — J449 Chronic obstructive pulmonary disease, unspecified: Secondary | ICD-10-CM | POA: Diagnosis present

## 2015-02-26 DIAGNOSIS — F17210 Nicotine dependence, cigarettes, uncomplicated: Secondary | ICD-10-CM | POA: Diagnosis present

## 2015-02-26 DIAGNOSIS — K559 Vascular disorder of intestine, unspecified: Secondary | ICD-10-CM | POA: Diagnosis not present

## 2015-02-26 DIAGNOSIS — G8929 Other chronic pain: Secondary | ICD-10-CM | POA: Diagnosis present

## 2015-02-26 DIAGNOSIS — K219 Gastro-esophageal reflux disease without esophagitis: Secondary | ICD-10-CM | POA: Diagnosis not present

## 2015-02-26 DIAGNOSIS — E860 Dehydration: Secondary | ICD-10-CM | POA: Diagnosis present

## 2015-02-26 DIAGNOSIS — E861 Hypovolemia: Secondary | ICD-10-CM | POA: Diagnosis present

## 2015-02-26 DIAGNOSIS — I951 Orthostatic hypotension: Secondary | ICD-10-CM | POA: Diagnosis not present

## 2015-02-26 DIAGNOSIS — F329 Major depressive disorder, single episode, unspecified: Secondary | ICD-10-CM | POA: Diagnosis present

## 2015-02-26 DIAGNOSIS — E782 Mixed hyperlipidemia: Secondary | ICD-10-CM | POA: Diagnosis present

## 2015-02-26 DIAGNOSIS — A09 Infectious gastroenteritis and colitis, unspecified: Secondary | ICD-10-CM | POA: Diagnosis not present

## 2015-02-26 DIAGNOSIS — Z66 Do not resuscitate: Secondary | ICD-10-CM | POA: Diagnosis present

## 2015-02-26 DIAGNOSIS — K529 Noninfective gastroenteritis and colitis, unspecified: Secondary | ICD-10-CM

## 2015-02-26 DIAGNOSIS — M810 Age-related osteoporosis without current pathological fracture: Secondary | ICD-10-CM | POA: Diagnosis present

## 2015-02-26 DIAGNOSIS — Z9841 Cataract extraction status, right eye: Secondary | ICD-10-CM | POA: Diagnosis not present

## 2015-02-26 DIAGNOSIS — K921 Melena: Secondary | ICD-10-CM | POA: Diagnosis not present

## 2015-02-26 DIAGNOSIS — F419 Anxiety disorder, unspecified: Secondary | ICD-10-CM | POA: Diagnosis present

## 2015-02-26 DIAGNOSIS — Z79899 Other long term (current) drug therapy: Secondary | ICD-10-CM | POA: Diagnosis not present

## 2015-02-26 DIAGNOSIS — E785 Hyperlipidemia, unspecified: Secondary | ICD-10-CM | POA: Diagnosis present

## 2015-02-26 DIAGNOSIS — M1990 Unspecified osteoarthritis, unspecified site: Secondary | ICD-10-CM | POA: Diagnosis present

## 2015-02-26 DIAGNOSIS — I771 Stricture of artery: Secondary | ICD-10-CM | POA: Diagnosis not present

## 2015-02-26 DIAGNOSIS — K625 Hemorrhage of anus and rectum: Secondary | ICD-10-CM | POA: Diagnosis present

## 2015-02-26 DIAGNOSIS — R52 Pain, unspecified: Secondary | ICD-10-CM

## 2015-02-26 DIAGNOSIS — R1084 Generalized abdominal pain: Secondary | ICD-10-CM | POA: Diagnosis not present

## 2015-02-26 HISTORY — DX: Melena: K92.1

## 2015-02-26 LAB — CBC WITH DIFFERENTIAL/PLATELET
BASOS ABS: 0 10*3/uL (ref 0.0–0.1)
Basophils Relative: 0 % (ref 0–1)
EOS ABS: 0.1 10*3/uL (ref 0.0–0.7)
Eosinophils Relative: 1 % (ref 0–5)
HCT: 39.6 % (ref 36.0–46.0)
Hemoglobin: 12.9 g/dL (ref 12.0–15.0)
LYMPHS ABS: 0.8 10*3/uL (ref 0.7–4.0)
Lymphocytes Relative: 9 % — ABNORMAL LOW (ref 12–46)
MCH: 31.5 pg (ref 26.0–34.0)
MCHC: 32.6 g/dL (ref 30.0–36.0)
MCV: 96.6 fL (ref 78.0–100.0)
MONO ABS: 0.8 10*3/uL (ref 0.1–1.0)
Monocytes Relative: 8 % (ref 3–12)
NEUTROS ABS: 7.6 10*3/uL (ref 1.7–7.7)
Neutrophils Relative %: 82 % — ABNORMAL HIGH (ref 43–77)
Platelets: 179 10*3/uL (ref 150–400)
RBC: 4.1 MIL/uL (ref 3.87–5.11)
RDW: 13.8 % (ref 11.5–15.5)
WBC: 9.2 10*3/uL (ref 4.0–10.5)

## 2015-02-26 LAB — COMPREHENSIVE METABOLIC PANEL
ALBUMIN: 3.7 g/dL (ref 3.5–5.2)
ALT: 44 U/L — AB (ref 0–35)
AST: 35 U/L (ref 0–37)
Alkaline Phosphatase: 104 U/L (ref 39–117)
Anion gap: 8 (ref 5–15)
BILIRUBIN TOTAL: 0.6 mg/dL (ref 0.3–1.2)
BUN: 10 mg/dL (ref 6–23)
CO2: 29 mmol/L (ref 19–32)
CREATININE: 0.83 mg/dL (ref 0.50–1.10)
Calcium: 8.7 mg/dL (ref 8.4–10.5)
Chloride: 102 mmol/L (ref 96–112)
GFR calc Af Amer: 79 mL/min — ABNORMAL LOW (ref >90)
GFR calc non Af Amer: 68 mL/min — ABNORMAL LOW (ref >90)
Glucose, Bld: 137 mg/dL — ABNORMAL HIGH (ref 70–99)
Potassium: 3.7 mmol/L (ref 3.5–5.1)
SODIUM: 139 mmol/L (ref 135–145)
TOTAL PROTEIN: 6.6 g/dL (ref 6.0–8.3)

## 2015-02-26 LAB — POC OCCULT BLOOD, ED: FECAL OCCULT BLD: POSITIVE — AB

## 2015-02-26 MED ORDER — PANTOPRAZOLE SODIUM 40 MG PO TBEC
40.0000 mg | DELAYED_RELEASE_TABLET | Freq: Every day | ORAL | Status: DC
  Administered 2015-02-26 – 2015-02-28 (×3): 40 mg via ORAL
  Filled 2015-02-26 (×3): qty 1

## 2015-02-26 MED ORDER — ACETAMINOPHEN 325 MG PO TABS
650.0000 mg | ORAL_TABLET | Freq: Four times a day (QID) | ORAL | Status: DC | PRN
  Administered 2015-02-27: 650 mg via ORAL
  Filled 2015-02-26: qty 2

## 2015-02-26 MED ORDER — IOHEXOL 300 MG/ML  SOLN
100.0000 mL | Freq: Once | INTRAMUSCULAR | Status: AC | PRN
  Administered 2015-02-26: 100 mL via INTRAVENOUS

## 2015-02-26 MED ORDER — SODIUM CHLORIDE 0.9 % IV BOLUS (SEPSIS)
1000.0000 mL | Freq: Once | INTRAVENOUS | Status: AC
Start: 2015-02-26 — End: 2015-02-26
  Administered 2015-02-26: 1000 mL via INTRAVENOUS

## 2015-02-26 MED ORDER — OXYCODONE HCL 5 MG PO TABS
5.0000 mg | ORAL_TABLET | ORAL | Status: DC | PRN
  Administered 2015-02-27 – 2015-02-28 (×4): 5 mg via ORAL
  Filled 2015-02-26 (×4): qty 1

## 2015-02-26 MED ORDER — ONDANSETRON HCL 4 MG/2ML IJ SOLN: 4.0000 mg | Freq: Four times a day (QID) | INTRAMUSCULAR | Status: DC | PRN

## 2015-02-26 MED ORDER — ONDANSETRON HCL 4 MG PO TABS
4.0000 mg | ORAL_TABLET | Freq: Four times a day (QID) | ORAL | Status: DC | PRN
  Administered 2015-02-28: 4 mg via ORAL
  Filled 2015-02-26: qty 1

## 2015-02-26 MED ORDER — SODIUM CHLORIDE 0.9 % IJ SOLN
3.0000 mL | Freq: Two times a day (BID) | INTRAMUSCULAR | Status: DC
  Administered 2015-02-26 – 2015-02-27 (×3): 3 mL via INTRAVENOUS

## 2015-02-26 MED ORDER — ACETAMINOPHEN 650 MG RE SUPP: 650.0000 mg | Freq: Four times a day (QID) | RECTAL | Status: DC | PRN

## 2015-02-26 MED ORDER — SODIUM CHLORIDE 0.9 % IV SOLN
INTRAVENOUS | Status: DC
  Administered 2015-02-26 – 2015-02-28 (×3): via INTRAVENOUS

## 2015-02-26 MED ORDER — MORPHINE SULFATE 2 MG/ML IJ SOLN: 1.0000 mg | INTRAMUSCULAR | Status: DC | PRN

## 2015-02-26 MED ORDER — SODIUM CHLORIDE 0.9 % IV BOLUS (SEPSIS)
1000.0000 mL | Freq: Once | INTRAVENOUS | Status: AC
  Administered 2015-02-26: 1000 mL via INTRAVENOUS

## 2015-02-26 MED ORDER — ALPRAZOLAM 0.5 MG PO TABS
0.5000 mg | ORAL_TABLET | Freq: Three times a day (TID) | ORAL | Status: DC | PRN
  Administered 2015-02-27 – 2015-02-28 (×3): 0.5 mg via ORAL
  Filled 2015-02-26 (×3): qty 1

## 2015-02-26 MED ORDER — ALUM & MAG HYDROXIDE-SIMETH 200-200-20 MG/5ML PO SUSP: 30.0000 mL | Freq: Four times a day (QID) | ORAL | Status: DC | PRN

## 2015-02-26 NOTE — ED Notes (Signed)
Patient states she feels dizzy when standing. RN made aware.

## 2015-02-26 NOTE — ED Notes (Signed)
Pt presents to ED complaining of diffuse abdominal pain rated as 10/10 and described as sharp and cramping.  She reports bloody diarrhea since Thursday 4/7 evening following a colonoscopy.  She has taken some OTC antidiarrheal medications with no relief but has not taken any pain medications to treat the abdominal pain.  She states that she has had "probably 100" episodes of diarrhea since symptoms first began and has begun to feel slightly shilled.  Denies nausea, vomiting, fever, urinary symptoms, sweating.  She has been drinking water and Gatorade at home to replace fluids.

## 2015-02-26 NOTE — Progress Notes (Signed)
Utilization review Completed Brylea Pita RN BSN   

## 2015-02-26 NOTE — ED Provider Notes (Signed)
CSN: [id number]     Arrival date & time 02/26/15  4098 History  This chart was scribed for Bethann Berkshire, MD by Abel Presto, ED Scribe. This patient was seen in room APA08/APA08 and the patient's care was started at 9:41 AM.    Chief Complaint  Patient presents with  . Diarrhea  . Abdominal Pain     Patient is a 74 y.o. female presenting with diarrhea. The history is provided by the patient. No language interpreter was used.  Diarrhea Quality:  Bloody Severity:  Severe Onset quality:  Sudden Duration:  4 days Timing:  Constant Progression:  Unchanged Relieved by:  Nothing Ineffective treatments:  Anti-motility medications Associated symptoms: abdominal pain   Associated symptoms: no fever, no headaches and no vomiting  Diaphoresis: 3.   Abdominal pain:    Location:  Generalized   Quality:  Sharp   Severity:  Severe   Onset quality:  Sudden   Duration:  4 days   Timing:  Constant   Progression:  Unchanged   Chronicity:  New  HPI Comments: Misty [name] is a 74 y.o. female with PMHx of who presents to the Emergency Department complaining of diarrhea with onset 3 days ago, progressing to hematochezia with bright red blood yesterday. Pt had a colonoscopy done, showing end stages of colitis, on 02/17/15 following 6 months of constipation. H&P notes states "She states hemorrhoids come out when she straining. She has occasional hematochezia. She was seen in the office recently and noted having positive stool." Pt took OTC anti-diarrhea medication with no relief. Pt notes associated 10/10 sharp abdominal pain. Pt denies nausea, vomiting, and fever.    Past Medical History  Diagnosis Date  . Migraines   . Osteoporosis   . Hyperlipidemia   . PONV (postoperative nausea and vomiting)   . History of bronchitis   . History of migraine     many yrs ago  . Numbness     left leg  . Chronic back pain   . Arthritis     back  . GERD (gastroesophageal reflux disease)     takes  Ranidine daily  . Constipation     OTC stool softener prn  . Hemorrhoids   . Diverticulosis   . Urinary frequency   . Urinary urgency   . Nocturia   . Depression   . Anxiety     takes Xanax daily  . Insomnia   . History of shingles   . Cataract   . COPD (chronic obstructive pulmonary disease)    Past Surgical History  Procedure Laterality Date  . Abdominal hysterectomy    . Esophagogastroduodenoscopy    . Colonoscopy    . Lumbar laminectomy/decompression microdiscectomy Bilateral 05/20/2013    Procedure: LUMBAR LAMINECTOMY/DECOMPRESSION MICRODISCECTOMY 1 LEVEL;  Surgeon: Hewitt Shorts, MD;  Location: [facility];  Service: Neurosurgery;  Laterality: Bilateral;  Bilateral Lumbar four-five laminotomy and left lumbar four-five microdiskectomy  . Eye surgery Right 3/15    cataracts  . Kyphoplasty N/A 07/08/2014    Procedure: Thoracic Eleven Kyphoplasty;  Surgeon: Hewitt Shorts, MD;  Location: [facility];  Service: Neurosurgery;  Laterality: N/A;  . Colonoscopy N/A 02/17/2015    Procedure: COLONOSCOPY;  Surgeon: Malissa Hippo, MD;  Location: [facility];  Service: Endoscopy;  Laterality: N/A;  110n - moved to 11:15 - Ann to notify pt   Family History  Problem Relation Age of Onset  . Hypertension Mother   . Colon cancer Neg  Hx    History  Substance Use Topics  . Smoking status: Current Some Day Smoker -- 1.00 packs/day for 52 years    Types: Cigarettes    Start date: 11/18/1958    Last Attempt to Quit: 08/20/2011  . Smokeless tobacco: Never Used     Comment: 1/2-1pack off and on all her life  . Alcohol Use: No   OB History    No data available     Review of Systems  Constitutional: Negative for fever, appetite change and fatigue. Diaphoresis: 3.  HENT: Negative for congestion, ear discharge and sinus pressure.   Eyes: Negative for discharge.  Respiratory: Negative for cough.   Cardiovascular: Negative for chest pain.  Gastrointestinal: Positive for  abdominal pain, diarrhea and blood in stool. Negative for nausea and vomiting.  Genitourinary: Negative for frequency and hematuria.  Musculoskeletal: Negative for back pain.  Skin: Negative for rash.  Neurological: Negative for seizures and headaches.  Psychiatric/Behavioral: Negative for hallucinations.      Allergies  Codeine; Asa; Azithromycin; Celebrex; Cymbalta; Prednisone; Vioxx; Zelnorm; and Zocor  Home Medications   Prior to Admission medications   Medication Sig Start Date End Date Taking? Authorizing Provider  acetaminophen (TYLENOL) 500 MG tablet Take 500 mg by mouth every 6 (six) hours as needed for moderate pain or headache.     Historical Provider, MD  ALPRAZolam Prudy Feeler) 0.5 MG tablet Take 1 tablet (0.5 mg total) by mouth 3 (three) times daily as needed. for anxiety 01/09/15   Ernestina Penna, MD  cholecalciferol (VITAMIN D) 1000 UNITS tablet Take 2,000-4,000 Units by mouth daily. Take 2000 units daily except take 4000 units on Saturday and Sunday.    Historical Provider, MD  Multiple Vitamins-Minerals (CENTRUM SILVER PO) Take 1 tablet by mouth daily.    Historical Provider, MD  polyethylene glycol powder (GLYCOLAX/MIRALAX) powder Take 17 g by mouth at bedtime. 02/17/15   Malissa Hippo, MD  ranitidine (ZANTAC) 150 MG tablet Take 150 mg by mouth 2 (two) times daily.     Historical Provider, MD  vitamin B-12 (CYANOCOBALAMIN) 1000 MCG tablet Take 1,000 mcg by mouth daily.    Historical Provider, MD  Wheat Dextrin (BENEFIBER) POWD Take 4 g by mouth at bedtime. 02/17/15   Malissa Hippo, MD   BP 109/56 mmHg  Pulse 95  Temp(Src) 97.8 F (36.6 C) (Oral)  Resp 18  Ht  (1.702 m)  Wt 124 lb (56.246 kg)  BMI 19.42 kg/m2  SpO2 93% Physical Exam  Constitutional: She is oriented to person, place, and time. She appears well-developed.  HENT:  Head: Normocephalic.  Eyes: Conjunctivae and EOM are normal. No scleral icterus.  Neck: Neck supple. No thyromegaly present.   Cardiovascular: Normal rate and regular rhythm.  Exam reveals no gallop and no friction rub.   No murmur heard. Pulmonary/Chest: No stridor. She has no wheezes. She has no rales. She exhibits no tenderness.  Abdominal: She exhibits no distension. There is no tenderness (moderate throughout). There is no rebound.  Musculoskeletal: Normal range of motion. She exhibits no edema.  Lymphadenopathy:    She has no cervical adenopathy.  Neurological: She is alert and oriented to person, place, and time. She exhibits normal muscle tone. Coordination normal.  Skin: No rash noted. No erythema.  Psychiatric: She has a normal mood and affect. Her behavior is normal.  Nursing note and vitals reviewed.   ED Course  Procedures (including critical care time) DIAGNOSTIC STUDIES: Oxygen Saturation is 93%  on room air, adequate by my interpretation.    COORDINATION OF CARE: 9:45 AM Discussed treatment plan with patient at beside, the patient agrees with the plan and has no further questions at this time.   Labs Review Labs Reviewed - No data to display  Imaging Review No results found.   EKG Interpretation None     No orders of the defined types were placed in this encounter.    MDM  Spoke with gi dr. Kendell Bane and he rec admit, gi consult in am,  Fluids,  Hold off on antibiotics Final diagnoses:  None    Admit for colitis  The chart was scribed for me under my direct supervision.  I personally performed the history, physical, and medical decision making and all procedures in the evaluation of this patient.Bethann Berkshire, MD 02/26/15 1537

## 2015-02-26 NOTE — H&P (Addendum)
[facility] History and Physical  Misty [name] ZOX:[id number] DOB: 31-Mar-1941 DOA: 02/26/2015  Referring physician:  PCP: Rudi Heap, MD   Chief Complaint: Bright red blood per rectum  HPI: Misty [name] is a 74 y.o. female with a past medical history of gastroesophageal reflux disease having intermittent bright red blood per rectum, who underwent colonoscopy on 02/17/2015, procedure performed by Dr.Rehman of gastroenterology. This procedure showed focal edema and erythema with linear erosions at the proximal sigmoid colon with scattered diverticula. Biopsies were taken from sigmoid colon showed benign colonic mucosa with ischemic changes, no evidence of malignancy or active inflammation. She presents to the emergency room with complaints of bright red blood per rectum progressively worsening over the past 48 hours. He states symptoms initially started out as diarrhea having liquid consistency stools then noted her stools to turn her bloody. She also complains of associated abdominal pain mainly located in the lower abdominal region. She denies fevers, chills, nausea, vomiting, hematemesis. She was further worked up with a CT scan of abdomen and pelvis in the emergency room that revealed diffuse colitis extending from the rectum to the ascending colon. Radiology did report significant atherosclerotic changes causing stenosis at the origins of the celiac and SMA.                                                                    Review of Systems:  Constitutional:  No weight loss, night sweats, Fevers, chills, fatigue.  HEENT:  No headaches, Difficulty swallowing,Tooth/dental problems,Sore throat,  No sneezing, itching, ear ache, nasal congestion, post nasal drip,  Cardio-vascular:  No chest pain, Orthopnea, PND, swelling in lower extremities, anasarca, dizziness, palpitations  GI:  No heartburn, indigestion,  positive for lower abdominal pain,  denies nausea, vomiting, diarrhea,   positive for change in bowel habits, loss of appetite, and bloody stools  Resp:  No shortness of breath with exertion or at rest. No excess mucus, no productive cough, No non-productive cough, No coughing up of blood.No change in color of mucus.No wheezing.No chest wall deformity  Skin:  no rash or lesions.  GU:  no dysuria, change in color of urine, no urgency or frequency. No flank pain.  Musculoskeletal:  No joint pain or swelling. No decreased range of motion. No back pain.  Psych:  No change in mood or affect. No depression or anxiety. No memory loss.   Past Medical History  Diagnosis Date  . Migraines   . Osteoporosis   . Hyperlipidemia   . PONV (postoperative nausea and vomiting)   . History of bronchitis   . History of migraine     many yrs ago  . Numbness     left leg  . Chronic back pain   . Arthritis     back  . GERD (gastroesophageal reflux disease)     takes Ranidine daily  . Constipation     OTC stool softener prn  . Hemorrhoids   . Diverticulosis   . Urinary frequency   . Urinary urgency   . Nocturia   . Depression   . Anxiety     takes Xanax daily  . Insomnia   . History of shingles   . Cataract   . COPD (chronic obstructive pulmonary  disease)    Past Surgical History  Procedure Laterality Date  . Abdominal hysterectomy    . Esophagogastroduodenoscopy    . Colonoscopy    . Lumbar laminectomy/decompression microdiscectomy Bilateral 05/20/2013    Procedure: LUMBAR LAMINECTOMY/DECOMPRESSION MICRODISCECTOMY 1 LEVEL;  Surgeon: Hewitt Shorts, MD;  Location: [facility];  Service: Neurosurgery;  Laterality: Bilateral;  Bilateral Lumbar four-five laminotomy and left lumbar four-five microdiskectomy  . Eye surgery Right 3/15    cataracts  . Kyphoplasty N/A 07/08/2014    Procedure: Thoracic Eleven Kyphoplasty;  Surgeon: Hewitt Shorts, MD;  Location: [facility];  Service: Neurosurgery;  Laterality: N/A;  . Colonoscopy N/A 02/17/2015    Procedure:  COLONOSCOPY;  Surgeon: Malissa Hippo, MD;  Location: [facility];  Service: Endoscopy;  Laterality: N/A;  110n - moved to 11:15 - Ann to notify pt   Social History:  reports that she has been smoking Cigarettes.  She started smoking about 56 years ago. She has a 52 pack-year smoking history. She has never used smokeless tobacco. She reports that she does not drink alcohol or use illicit drugs.  Allergies  Allergen Reactions  . Codeine Nausea And Vomiting  . Asa [Aspirin] Other (See Comments)    Patient is unaware of allergy  . Azithromycin Other (See Comments)    Patient is unaware of allergy  . Celebrex [Celecoxib] Other (See Comments)    Irritated stomach   . Cymbalta [Duloxetine Hcl] Other (See Comments)    Felt groggy  . Prednisone Rash    She has seen the podiatrist and he had injected cortisone in her feet and she had also taken a peel at home. She had a severe reaction to her face with a rash and had to take Benadryl to resolve the symptom. She actually did get more cortisone shots, but no additional reactions to the shots.  . Vioxx [Rofecoxib] Other (See Comments)    Unknown  . Zelnorm [Tegaserod] Other (See Comments)    Patient is unaware of allergy  . Zocor [Simvastatin] Other (See Comments)    Patient is unaware of allergy    Family History  Problem Relation Age of Onset  . Hypertension Mother   . Colon cancer Neg Hx      CAD                                                                       Father  Prior to Admission medications   Medication Sig Start Date End Date Taking? Authorizing Provider  acetaminophen (TYLENOL) 500 MG tablet Take 500 mg by mouth every 6 (six) hours as needed for moderate pain or headache.    Yes Historical Provider, MD  ALPRAZolam Prudy Feeler) 0.5 MG tablet Take 1 tablet (0.5 mg total) by mouth 3 (three) times daily as needed. for anxiety 01/09/15  Yes Ernestina Penna, MD  cholecalciferol (VITAMIN D) 1000 UNITS tablet Take 2,000-4,000 Units by  mouth daily. Take 2000 units daily except take 4000 units on Saturday and Sunday.   Yes Historical Provider, MD  loperamide (IMODIUM A-D) 2 MG tablet Take 2 mg by mouth as needed for diarrhea or loose stools.   Yes Historical Provider, MD  Multiple Vitamins-Minerals (CENTRUM SILVER PO) Take  1 tablet by mouth daily.   Yes Historical Provider, MD  polyethylene glycol powder (GLYCOLAX/MIRALAX) powder Take 17 g by mouth at bedtime. 02/17/15  Yes Malissa Hippo, MD  ranitidine (ZANTAC) 150 MG tablet Take 150 mg by mouth 2 (two) times daily.    Yes Historical Provider, MD  vitamin B-12 (CYANOCOBALAMIN) 1000 MCG tablet Take 1,000 mcg by mouth daily.   Yes Historical Provider, MD  Wheat Dextrin (BENEFIBER) POWD Take 4 g by mouth at bedtime. 02/17/15  Yes Malissa Hippo, MD   Physical Exam: Filed Vitals:   02/26/15 1400 02/26/15 1430 02/26/15 1500 02/26/15 1529  BP: 123/76 141/79 115/61 121/63  Pulse: 91 88 80 82  Temp:    98.7 F (37.1 C)  TempSrc:      Resp:    18  Height:      Weight:      SpO2: 95% 92% 95% 94%    Wt Readings from Last 3 Encounters:  02/26/15 56.246 kg (124 lb)  02/17/15 56.246 kg (124 lb)  02/08/15 56.246 kg (124 lb)    General: She appears to be an mild discomfort, although awake, alert, mentating well  Eyes: PERRL, normal lids, irises & conjunctiva ENT: grossly normal hearing, lips & tongue Neck: no LAD, masses or thyromegaly Cardiovascular: RRR, no m/r/g. No LE edema. Telemetry: SR, no arrhythmias  Respiratory: CTA bilaterally, no w/r/r. Normal respiratory effort. Abdomen: soft, she has pain to palpation over her lower abdominal region, did not palpate masses nor was there rebound tenderness or guarding present.  Skin: no rash or induration seen on limited exam Musculoskeletal: grossly normal tone BUE/BLE Psychiatric: grossly normal mood and affect, speech fluent and appropriate Neurologic: grossly non-focal.          Labs on Admission:  Basic Metabolic  Panel:  Recent Labs Lab 02/26/15 0955  NA 139  K 3.7  CL 102  CO2 29  GLUCOSE 137*  BUN 10  CREATININE 0.83  CALCIUM 8.7   Liver Function Tests:  Recent Labs Lab 02/26/15 0955  AST 35  ALT 44*  ALKPHOS 104  BILITOT 0.6  PROT 6.6  ALBUMIN 3.7   No results for input(s): LIPASE, AMYLASE in the last 168 hours. No results for input(s): AMMONIA in the last 168 hours. CBC:  Recent Labs Lab 02/26/15 0955  WBC 9.2  NEUTROABS 7.6  HGB 12.9  HCT 39.6  MCV 96.6  PLT 179   Cardiac Enzymes: No results for input(s): CKTOTAL, CKMB, CKMBINDEX, TROPONINI in the last 168 hours.  BNP (last 3 results) No results for input(s): BNP in the last 8760 hours.  ProBNP (last 3 results) No results for input(s): PROBNP in the last 8760 hours.  CBG: No results for input(s): GLUCAP in the last 168 hours.  Radiological Exams on Admission: Ct Abdomen Pelvis W Contrast  02/26/2015   CLINICAL DATA:  Diarrhea and rectal bleeding 1 day ago and today, colonoscopy 2 days ago, history of 6 months of constipation and intermittent GI bleed, rectal bleeding for 2 days, possible colitis at colonoscopy  EXAM: CT ABDOMEN AND PELVIS WITH CONTRAST  TECHNIQUE: Multidetector CT imaging of the abdomen and pelvis was performed using the standard protocol following bolus administration of intravenous contrast. Sagittal and coronal MPR images reconstructed from axial data set.  CONTRAST:  OMNIPAQUE IOHEXOL 300 MG/ML SOLN IV. Dilute oral contrast.  COMPARISON:  None  FINDINGS: Mild atelectasis at RIGHT lung base.  Low-attenuation hepatic nodules questions cysts largest 13 mm  greatest diameter.  Extrarenal pelves bilaterally without gross hydronephrosis.  Liver, spleen, pancreas, kidneys, and adrenal glands otherwise normal.  Diffuse wall thickening of the colon from the proximal transverse colon through rectum with additional mild patchy wall thickening at the ascending colon compatible with colitis.  Mild  associated pericolic inflammatory/infiltrative changes at the descending and sigmoid colon.  No evidence of perforation or abscess.  Normal appendix.  Stomach and small bowel loops unremarkable.  Extensive atherosclerotic calcifications aorta with moderate thrombus at the level of the the aortic hiatus.  Stenoses noted at origins of SMA and celiac artery and no definite IMA is visualized.  No mass, adenopathy, free air, or ascites.  Bones diffusely demineralized.  IMPRESSION: Diffuse colitis extending from the rectum to the ascending colon ; differential diagnosis includes inflammatory bowel disease and infection, and ischemia, ischemia typically less likely due the length of involvement though this patient does have significant atherosclerotic changes causing stenoses at the origins of the celiac artery and SMA with nonvisualization of IMA.   Electronically Signed   By: Ulyses Southward M.D.   On: 02/26/2015 14:49    EKG: Independently reviewed.   Assessment/Plan Principal Problem:   Hematochezia Active Problems:   Ischemic colitis   Orthostatic hypotension   GERD (gastroesophageal reflux disease)   Hyperlipidemia   1. Probable lower GI bleed. Patient having a history of intermittent bright red blood per rectum, undergoing colonoscopy on 02/17/2015 procedure performed by Dr.Rehman of gastroenterology. Procedure showed focal edema and erythema with biopsies taken from sigmoid colon showing benign colonic mucosa with ischemic changes. CT scan of abdomen and pelvis today showing diffuse colitis from rectum to ascending colon. Radiology did report significant atherosclerotic changes causing stenosis at at origins of the celiac and SMA. Initial labs performed in the emergency room showed hemoglobin of 12.9 with hematocrit of 39.6. Will admit her to telemetry, provide IV fluid resuscitation, repeat CBC in a.m. GI consultation placed await further recommendations from Dr Karilyn Cota in am.  2. Diarrhea. Patient  reporting watery diarrhea that progressively became bloody. Will check stool studies (stool pathogen panel) as well as stool for C. Difficile colitis, Place her on enteric precautions for now. Provide IV fluid resuscitation. 3. Orthostatic hypotension. In the emergency room her blood pressure dropped from systolic blood pressure of 120 to 95 from laying to standing. This is likely due to hypovolemia/dehydration given multiple episodes of liquid consistency diarrhea and bloody stools. Will hydrate her with normal saline running at 100 mL/hour overnight, repeat orthostatics in a.m. 4. Possible ischemic colitis. Patient having a recent colonoscopy for intermittent hematochezia with pathology showing ischemic changes. CT scan of abdomen and pelvis did show evidence of atherosclerotic disease. Dr Benard Rink not recommending emperic antibiotic therapy for now. Await further recommendations from her gastroenterologist Dr Karilyn Cota in am.  5. Gastroesophageal reflux disease. Continue PPI 6. DVT prophylaxis. SCDs    Code Status: CODE STATUS discussed with patient and husband who was present at bedside, she is a DO NOT RESUSCITATE  Family Communication: Spoke with husband present at bedside  Disposition Plan: Anticipate she may require greater than 2 night hospitalization will admit to inpatient service  Time spent: 65 minutes  Jeralyn Bennett [facility] Pager [phone]

## 2015-02-27 DIAGNOSIS — K529 Noninfective gastroenteritis and colitis, unspecified: Secondary | ICD-10-CM

## 2015-02-27 DIAGNOSIS — K921 Melena: Secondary | ICD-10-CM

## 2015-02-27 DIAGNOSIS — I771 Stricture of artery: Secondary | ICD-10-CM

## 2015-02-27 LAB — CBC
HCT: 33 % — ABNORMAL LOW (ref 36.0–46.0)
Hemoglobin: 10.7 g/dL — ABNORMAL LOW (ref 12.0–15.0)
MCH: 31.3 pg (ref 26.0–34.0)
MCHC: 32.4 g/dL (ref 30.0–36.0)
MCV: 96.5 fL (ref 78.0–100.0)
PLATELETS: 149 10*3/uL — AB (ref 150–400)
RBC: 3.42 MIL/uL — AB (ref 3.87–5.11)
RDW: 13.7 % (ref 11.5–15.5)
WBC: 5 10*3/uL (ref 4.0–10.5)

## 2015-02-27 LAB — BASIC METABOLIC PANEL
ANION GAP: 6 (ref 5–15)
CALCIUM: 8.1 mg/dL — AB (ref 8.4–10.5)
CHLORIDE: 109 mmol/L (ref 96–112)
CO2: 27 mmol/L (ref 19–32)
CREATININE: 0.66 mg/dL (ref 0.50–1.10)
GFR calc non Af Amer: 86 mL/min — ABNORMAL LOW (ref >90)
Glucose, Bld: 101 mg/dL — ABNORMAL HIGH (ref 70–99)
Potassium: 3.3 mmol/L — ABNORMAL LOW (ref 3.5–5.1)
Sodium: 142 mmol/L (ref 135–145)

## 2015-02-27 MED ORDER — POTASSIUM CHLORIDE CRYS ER 20 MEQ PO TBCR
40.0000 meq | EXTENDED_RELEASE_TABLET | Freq: Once | ORAL | Status: AC
  Administered 2015-02-27: 40 meq via ORAL
  Filled 2015-02-27: qty 2

## 2015-02-27 NOTE — Progress Notes (Signed)
[facility] PROGRESS NOTE  Misty [name] FYB:[id number] DOB: 1941-04-23 DOA: 02/26/2015 PCP: Rudi Heap, MD  Assessment/Plan: 1. BRBPR secondary to colitis likely infectious confirmed by CT. Recent colonoscopy on 02/17/2015 procedure performed by Dr.Rehman of gastroenterology. Evaluated by Dr Karilyn Cota who opines not appear to be ischemic and recommends CT angiogram at later date. Advance diet await stool studies as she has not had stool since admission.she remains afebrile, no leukocytosis and non-toxic appearing. No indication of antibiotics  2. Diarrhea. Patient reporting watery diarrhea that progressively became bloody. No further stools since admission. Continue enteric precautions for now. Continue IV fluids at slower. 3. Orthostatic hypotension. Likely due to hypovolemia/dehydration given multiple episodes of liquid consistency diarrhea and bloody stools. Improved with IV fluids. Mild from sitting to standing. Continue IV fluids at slower rate 4. Possible ischemic colitis. See #1. Recommend CT angiogram at later date per GI  5. Gastroesophageal reflux disease. Continue PPI 6. DVT prophylaxis. SCDs  Code Status: full Family Communication: none present Disposition Plan: home hopefully in am   Consultants:  Dr Karilyn Cota gastroenterology  Procedures:  none  Antibiotics:  none  HPI/Subjective: Denies pain/discomfort and stated "i need something to eat and i want to go home. "  Objective: Filed Vitals:   02/27/15 0500  BP: 100/60  Pulse: 72  Temp: 99.1 F (37.3 C)  Resp: 18    Intake/Output Summary (Last 24 hours) at 02/27/15 1522 Last data filed at 02/27/15 1054  Gross per 24 hour  Intake 1481.33 ml  Output    850 ml  Net 631.33 ml   Filed Weights   02/26/15 0941 02/26/15 1717  Weight: 56.246 kg (124 lb) 54.885 kg (121 lb)    Exam:   General:  Well nourished appears comfortable  Cardiovascular: RRR no MGR No LE edema  Respiratory: normal effort  BS clear bilaterally no wheeze  Abdomen: soft non-distended +BS mild tenderness lower quadrants no rebound or guarding  Musculoskeletal: no clubbing or cyanosis   Data Reviewed: Basic Metabolic Panel:  Recent Labs Lab 02/26/15 0955 02/27/15 0550  NA 139 142  K 3.7 3.3*  CL 102 109  CO2 29 27  GLUCOSE 137* 101*  BUN 10 <5*  CREATININE 0.83 0.66  CALCIUM 8.7 8.1*   Liver Function Tests:  Recent Labs Lab 02/26/15 0955  AST 35  ALT 44*  ALKPHOS 104  BILITOT 0.6  PROT 6.6  ALBUMIN 3.7   No results for input(s): LIPASE, AMYLASE in the last 168 hours. No results for input(s): AMMONIA in the last 168 hours. CBC:  Recent Labs Lab 02/26/15 0955 02/27/15 0550  WBC 9.2 5.0  NEUTROABS 7.6  --   HGB 12.9 10.7*  HCT 39.6 33.0*  MCV 96.6 96.5  PLT 179 149*   Cardiac Enzymes: No results for input(s): CKTOTAL, CKMB, CKMBINDEX, TROPONINI in the last 168 hours. BNP (last 3 results) No results for input(s): BNP in the last 8760 hours.  ProBNP (last 3 results) No results for input(s): PROBNP in the last 8760 hours.  CBG: No results for input(s): GLUCAP in the last 168 hours.  No results found for this or any previous visit (from the past 240 hour(s)).   Studies: Ct Abdomen Pelvis W Contrast  02/26/2015   CLINICAL DATA:  Diarrhea and rectal bleeding 1 day ago and today, colonoscopy 2 days ago, history of 6 months of constipation and intermittent GI bleed, rectal bleeding for 2 days, possible colitis at colonoscopy  EXAM: CT ABDOMEN AND PELVIS  WITH CONTRAST  TECHNIQUE: Multidetector CT imaging of the abdomen and pelvis was performed using the standard protocol following bolus administration of intravenous contrast. Sagittal and coronal MPR images reconstructed from axial data set.  CONTRAST:  OMNIPAQUE IOHEXOL 300 MG/ML SOLN IV. Dilute oral contrast.  COMPARISON:  None  FINDINGS: Mild atelectasis at RIGHT lung base.  Low-attenuation hepatic nodules questions cysts  largest 13 mm greatest diameter.  Extrarenal pelves bilaterally without gross hydronephrosis.  Liver, spleen, pancreas, kidneys, and adrenal glands otherwise normal.  Diffuse wall thickening of the colon from the proximal transverse colon through rectum with additional mild patchy wall thickening at the ascending colon compatible with colitis.  Mild associated pericolic inflammatory/infiltrative changes at the descending and sigmoid colon.  No evidence of perforation or abscess.  Normal appendix.  Stomach and small bowel loops unremarkable.  Extensive atherosclerotic calcifications aorta with moderate thrombus at the level of the the aortic hiatus.  Stenoses noted at origins of SMA and celiac artery and no definite IMA is visualized.  No mass, adenopathy, free air, or ascites.  Bones diffusely demineralized.  IMPRESSION: Diffuse colitis extending from the rectum to the ascending colon ; differential diagnosis includes inflammatory bowel disease and infection, and ischemia, ischemia typically less likely due the length of involvement though this patient does have significant atherosclerotic changes causing stenoses at the origins of the celiac artery and SMA with nonvisualization of IMA.   Electronically Signed   By: Ulyses Southward M.D.   On: 02/26/2015 14:49    Scheduled Meds: . pantoprazole  40 mg Oral Daily  . sodium chloride  3 mL Intravenous Q12H   Continuous Infusions: . sodium chloride 50 mL/hr at 02/27/15 1056    Principal Problem:   Hematochezia Active Problems:   GERD (gastroesophageal reflux disease)   Hyperlipidemia   Ischemic colitis   Orthostatic hypotension    Time spent: 35 minutes    [facility] M  [facility] Pager [phone]. If 7PM-7AM, please contact night-coverage at [email or web address], password [facility] 02/27/2015, 3:22 PM  LOS: 1 day

## 2015-02-27 NOTE — Consult Note (Signed)
Referring Provider: Thersa Salt, MD  Primary Care Physician:  Redge Gainer, MD Primary Gastroenterologist:  Dr. Laural Golden  Reason for Consultation:    Acute colitis.  HPI:   Patient is 74 year old Caucasian female who underwent elective colonoscopy on 02/17/2015 because of progressive constipation and heme-positive stool. She also has history of intermittent hematochezia felt to be secondary to hemorrhoids. Colonoscopy revealed focal edema and erythema with linear erosion at proximal sigmoid colon. This area was biopsied. She has scattered diverticula at sigmoid colon and external hemorrhoids. Colonic biopsy reveals no abnormality. Patient states she was fine until evening of 02/23/2015 when she noted lower abdominal scar for than bloating followed by nonbloody diarrhea. She had multiple stools the following 2 days. As today she noted watery stool mixed with bloods. Since she was not feeling any better she decided to come to emergency room. Her WBC was 9.2 and H&H was 12.9 and 39.6 she had 82% neutrophils. Abdominopelvic CT was obtained which revealed diffuse changes of colitis from rectum all the way to ascending colon. Patient did not experience nausea vomiting fever or chills. There is also no history of recent antibiotic use. Her husband does not have diarrhea she may have come in contact with people with diarrhea. Patient states she had no problems other than bloating following her colonoscopy in she had normal bowel movements and had good appetite. Patient has not had any bowel movement since hospitalization. She is complaining about getting clear liquid diet only. She is hungry. She is worried about her husband who has chronic illness.   Past Medical History  Diagnosis Date  . Migraines   . Osteoporosis   . Hyperlipidemia   . PONV (postoperative nausea and vomiting)   . History of bronchitis   . History of migraine     many yrs ago  . Numbness     left leg  . Chronic  back pain   . Arthritis     back  . GERD (gastroesophageal reflux disease)     takes Ranidine daily  . Constipation     OTC stool softener prn  . Hemorrhoids   . Diverticulosis   . Urinary frequency   . Urinary urgency   . Nocturia   . Depression   . Anxiety     takes Xanax daily  . Insomnia   . History of shingles   . Cataract   . COPD (chronic obstructive pulmonary disease)     Past Surgical History  Procedure Laterality Date  . Abdominal hysterectomy    . Esophagogastroduodenoscopy    . Colonoscopy    . Lumbar laminectomy/decompression microdiscectomy Bilateral 05/20/2013    Procedure: LUMBAR LAMINECTOMY/DECOMPRESSION MICRODISCECTOMY 1 LEVEL;  Surgeon: Hosie Spangle, MD;  Location: [place] [facility];  Service: Neurosurgery;  Laterality: Bilateral;  Bilateral Lumbar four-five laminotomy and left lumbar four-five microdiskectomy  . Eye surgery Right 3/15    cataracts  . Kyphoplasty N/A 07/08/2014    Procedure: Thoracic Eleven Kyphoplasty;  Surgeon: Hosie Spangle, MD;  Location: [place] [facility];  Service: Neurosurgery;  Laterality: N/A;  . Colonoscopy N/A 02/17/2015    Procedure: COLONOSCOPY;  Surgeon: Rogene Houston, MD;  Location: [facility];  Service: Endoscopy;  Laterality: N/A;  110n - moved to 11:15 - Ann to notify pt    Prior to Admission medications   Medication Sig Start Date End Date Taking? Authorizing Provider  acetaminophen (TYLENOL) 500 MG tablet Take 500 mg by mouth every 6 (six) hours as needed  for moderate pain or headache.    Yes Historical Provider, MD  ALPRAZolam Duanne Moron) 0.5 MG tablet Take 1 tablet (0.5 mg total) by mouth 3 (three) times daily as needed. for anxiety 01/09/15  Yes Chipper Herb, MD  cholecalciferol (VITAMIN D) 1000 UNITS tablet Take 2,000-4,000 Units by mouth daily. Take 2000 units daily except take 4000 units on Saturday and Sunday.   Yes Historical Provider, MD  loperamide (IMODIUM A-D) 2 MG tablet Take 2 mg by mouth as needed for diarrhea  or loose stools.   Yes Historical Provider, MD  Multiple Vitamins-Minerals (CENTRUM SILVER PO) Take 1 tablet by mouth daily.   Yes Historical Provider, MD  polyethylene glycol powder (GLYCOLAX/MIRALAX) powder Take 17 g by mouth at bedtime. 02/17/15  Yes Rogene Houston, MD  ranitidine (ZANTAC) 150 MG tablet Take 150 mg by mouth 2 (two) times daily.    Yes Historical Provider, MD  vitamin B-12 (CYANOCOBALAMIN) 1000 MCG tablet Take 1,000 mcg by mouth daily.   Yes Historical Provider, MD  Wheat Dextrin (BENEFIBER) POWD Take 4 g by mouth at bedtime. 02/17/15  Yes Rogene Houston, MD    Current Facility-Administered Medications  Medication Dose Route Frequency Provider Last Rate Last Dose  . 0.9 %  sodium chloride infusion   Intravenous Continuous Radene Gunning, NP 50 mL/hr at 02/27/15 1056    . acetaminophen (TYLENOL) tablet 650 mg  650 mg Oral Q6H PRN Kelvin Cellar, MD   650 mg at 02/27/15 0011   Or  . acetaminophen (TYLENOL) suppository 650 mg  650 mg Rectal Q6H PRN Kelvin Cellar, MD      . ALPRAZolam Duanne Moron) tablet 0.5 mg  0.5 mg Oral TID PRN Kelvin Cellar, MD      . alum & mag hydroxide-simeth (MAALOX/MYLANTA) 200-200-20 MG/5ML suspension 30 mL  30 mL Oral Q6H PRN Kelvin Cellar, MD      . morphine 2 MG/ML injection 1-2 mg  1-2 mg Intravenous Q4H PRN Kelvin Cellar, MD      . ondansetron (ZOFRAN) tablet 4 mg  4 mg Oral Q6H PRN Kelvin Cellar, MD       Or  . ondansetron (ZOFRAN) injection 4 mg  4 mg Intravenous Q6H PRN Kelvin Cellar, MD      . oxyCODONE (Oxy IR/ROXICODONE) immediate release tablet 5 mg  5 mg Oral Q4H PRN Kelvin Cellar, MD   5 mg at 02/27/15 1109  . pantoprazole (PROTONIX) EC tablet 40 mg  40 mg Oral Daily Kelvin Cellar, MD   40 mg at 02/27/15 1054  . sodium chloride 0.9 % injection 3 mL  3 mL Intravenous Q12H Kelvin Cellar, MD   3 mL at 02/27/15 1055    Allergies as of 02/26/2015 - Review Complete 02/26/2015  Allergen Reaction Noted  . Codeine Nausea And  Vomiting 04/08/2013  . Asa [aspirin] Other (See Comments) 04/08/2013  . Azithromycin Other (See Comments) 04/08/2013  . Celebrex [celecoxib] Other (See Comments) 04/08/2013  . Cymbalta [duloxetine hcl] Other (See Comments) 04/08/2013  . Prednisone Rash 03/25/2013  . Vioxx [rofecoxib] Other (See Comments) 04/08/2013  . Zelnorm [tegaserod] Other (See Comments) 04/08/2013  . Zocor [simvastatin] Other (See Comments) 04/08/2013    Family History  Problem Relation Age of Onset  . Hypertension Mother   . Colon cancer Neg Hx     History   Social History  . Marital Status: Married    Spouse Name: N/A  . Number of Children: N/A  . Years of Education:  N/A   Occupational History  . Not on file.   Social History Main Topics  . Smoking status: Current Some Day Smoker -- 1.00 packs/day for 52 years    Types: Cigarettes    Start date: 11/18/1958    Last Attempt to Quit: 08/20/2011  . Smokeless tobacco: Never Used     Comment: 1/2-1pack off and on all her life  . Alcohol Use: No  . Drug Use: No  . Sexual Activity: Not Currently   Other Topics Concern  . Not on file   Social History Narrative    Review of Systems: See HPI, otherwise normal ROS  Physical Exam: Temp:  [98.1 F (36.7 C)-99.4 F (37.4 C)] 99.1 F (37.3 C) (04/11 0500) Pulse Rate:  [72-91] 72 (04/11 0500) Resp:  [16-21] 18 (04/11 0500) BP: (100-141)/(52-79) 100/60 mmHg (04/11 0500) SpO2:  [91 %-95 %] 93 % (04/11 0500) Weight:  [121 lb (54.885 kg)] 121 lb (54.885 kg) (04/10 1717) Last BM Date: 02/26/15 Patient is alert and in no acute distress. Conjunctiva is pink. Sclerae nonicteric. Oropharyngeal mucosa is normal. No neck masses or thyromegaly noted. Heart exam with regular rhythm normal S1 and S2. No murmur or gallop noted. Lungs are clear to auscultation. Abdomen is symmetrical. Bowel sounds are normal. On palpation abdomen is soft with mild generalized tenderness without guarding or rebound. No  organomegaly or masses. No peripheral edema or clubbing noted.    Lab Results:  Recent Labs  02/26/15 0955 02/27/15 0550  WBC 9.2 5.0  HGB 12.9 10.7*  HCT 39.6 33.0*  PLT 179 149*   BMET  Recent Labs  02/26/15 0955 02/27/15 0550  NA 139 142  K 3.7 3.3*  CL 102 109  CO2 29 27  GLUCOSE 137* 101*  BUN 10 <5*  CREATININE 0.83 0.66  CALCIUM 8.7 8.1*   LFT  Recent Labs  02/26/15 0955  PROT 6.6  ALBUMIN 3.7  AST 35  ALT 44*  ALKPHOS 104  BILITOT 0.6   PT/INR No results for input(s): LABPROT, INR in the last 72 hours. Hepatitis Panel No results for input(s): HEPBSAG, HCVAB, HEPAIGM, HEPBIGM in the last 72 hours.  Studies/Results: Ct Abdomen Pelvis W Contrast  02/26/2015   CLINICAL DATA:  Diarrhea and rectal bleeding 1 day ago and today, colonoscopy 2 days ago, history of 6 months of constipation and intermittent GI bleed, rectal bleeding for 2 days, possible colitis at colonoscopy  EXAM: CT ABDOMEN AND PELVIS WITH CONTRAST  TECHNIQUE: Multidetector CT imaging of the abdomen and pelvis was performed using the standard protocol following bolus administration of intravenous contrast. Sagittal and coronal MPR images reconstructed from axial data set.  CONTRAST:  163m OMNIPAQUE IOHEXOL 300 MG/ML SOLN IV. Dilute oral contrast.  COMPARISON:  None  FINDINGS: Mild atelectasis at RIGHT lung base.  Low-attenuation hepatic nodules questions cysts largest 13 mm greatest diameter.  Extrarenal pelves bilaterally without gross hydronephrosis.  Liver, spleen, pancreas, kidneys, and adrenal glands otherwise normal.  Diffuse wall thickening of the colon from the proximal transverse colon through rectum with additional mild patchy wall thickening at the ascending colon compatible with colitis.  Mild associated pericolic inflammatory/infiltrative changes at the descending and sigmoid colon.  No evidence of perforation or abscess.  Normal appendix.  Stomach and small bowel loops unremarkable.   Extensive atherosclerotic calcifications aorta with moderate thrombus at the level of the the aortic hiatus.  Stenoses noted at origins of SMA and celiac artery and no definite IMA is visualized.  No mass, adenopathy, free air, or ascites.  Bones diffusely demineralized.  IMPRESSION: Diffuse colitis extending from the rectum to the ascending colon ; differential diagnosis includes inflammatory bowel disease and infection, and ischemia, ischemia typically less likely due the length of involvement though this patient does have significant atherosclerotic changes causing stenoses at the origins of the celiac artery and SMA with nonvisualization of IMA.   Electronically Signed   By: Lavonia Dana M.D.   On: 02/26/2015 14:49   C. difficile by PCR is pending. Pathogen panel is pending  Assessment; Acute diarrhea secondary to colitis most likely due to an infection. Patient had colonoscopy on 02/17/2015 revealing focal erythema involving sigmoid colon but biopsy was unremarkable. She has dense calcification at take off of SMA  and celiac artery also appears to be narrowed at take off. Given distribution for colitis it does not appear to be ischemic injury but she will need further evaluation with CT angiogram abdomen at a later date  Recommendations; Advance diet. Await results of stool studies. If symptoms flare or patient has fever will consider empiric antibiotic therapy CTA abdomen and pelvis at a later date   LOS: 1 day   Roselyne Stalnaker U  02/27/2015, 1:02 PM

## 2015-02-27 NOTE — Care Management Note (Addendum)
    Page 1 of 1   02/28/2015     12:38:03 PM CARE MANAGEMENT NOTE 02/28/2015  Patient:  Misty [name], Misty [name]   Account Number:  [id number]  Date Initiated:  02/27/2015  Documentation initiated by:  Jolene Provost  Subjective/Objective Assessment:   Pt is from home, lives with husband. Pt is indepdent at baseline. Pt has no HH services or DME's. Pt plans to dsicharge home with self care. No CM needs.     Action/Plan:   Anticipated DC Date:  03/01/2015   Anticipated DC Plan:  [place]  CM consult      Choice offered to / List presented to:             Status of service:  Completed, signed off Medicare Important Message given?   (If response is "NO", the following Medicare IM given date fields will be blank) Date Medicare IM given:   Medicare IM given by:   Date Additional Medicare IM given:   Additional Medicare IM given by:    Discharge Disposition:  HOME/SELF CARE  Per UR Regulation:  Reviewed for med. necessity/level of care/duration of stay  If discussed at [place] of Stay Meetings, dates discussed:    Comments:  02/28/2015 [place], RN, MSN, CM Pt discharging home today, no CM needs. 02/27/2015 [place], RN, MSN, CM

## 2015-02-28 LAB — CBC
HEMATOCRIT: 33.6 % — AB (ref 36.0–46.0)
Hemoglobin: 10.8 g/dL — ABNORMAL LOW (ref 12.0–15.0)
MCH: 31.2 pg (ref 26.0–34.0)
MCHC: 32.1 g/dL (ref 30.0–36.0)
MCV: 97.1 fL (ref 78.0–100.0)
PLATELETS: 153 10*3/uL (ref 150–400)
RBC: 3.46 MIL/uL — ABNORMAL LOW (ref 3.87–5.11)
RDW: 13.7 % (ref 11.5–15.5)
WBC: 5 10*3/uL (ref 4.0–10.5)

## 2015-02-28 LAB — BASIC METABOLIC PANEL
Anion gap: 6 (ref 5–15)
BUN: 5 mg/dL — AB (ref 6–23)
CHLORIDE: 110 mmol/L (ref 96–112)
CO2: 27 mmol/L (ref 19–32)
Calcium: 8.3 mg/dL — ABNORMAL LOW (ref 8.4–10.5)
Creatinine, Ser: 0.67 mg/dL (ref 0.50–1.10)
GFR calc Af Amer: >90 mL/min (ref >90)
GFR calc non Af Amer: 85 mL/min — ABNORMAL LOW (ref >90)
GLUCOSE: 97 mg/dL (ref 70–99)
Potassium: 3.7 mmol/L (ref 3.5–5.1)
Sodium: 143 mmol/L (ref 135–145)

## 2015-02-28 MED ORDER — BISACODYL 10 MG RE SUPP
10.0000 mg | Freq: Once | RECTAL | Status: AC
  Administered 2015-02-28: 10 mg via RECTAL
  Filled 2015-02-28: qty 1

## 2015-02-28 MED ORDER — PANTOPRAZOLE SODIUM 40 MG PO TBEC: 40.0000 mg | DELAYED_RELEASE_TABLET | Freq: Every day | ORAL | Status: DC

## 2015-02-28 NOTE — Progress Notes (Signed)
Patient ID: Misty [name], female   DOB: 1941-08-24, 74 y.o.   MRN: [id number] States she feel better. She says she vomited last night after eating meatloaf for supper. No nausea today. She has not had a stool since admission.  Denies any fever. Feels 90% better. No BM since admission.  I/O last 3 completed shifts: In: 2588 [P.O.:240; I.V.:2348] Out: 2650 [Urine:2650]    Blood pressure 122/68, pulse 79, temperature 98.4 F (36.9 C), temperature source Oral, resp. rate 18, height 5' 7"  (1.702 m), weight 121 lb (54.885 kg), SpO2 92 %.    CBC Latest Ref Rng 02/28/2015 02/27/2015 02/26/2015  WBC 4.0 - 10.5 K/uL 5.0 5.0 9.2  Hemoglobin 12.0 - 15.0 g/dL 10.8(L) 10.7(L) 12.9  Hematocrit 36.0 - 46.0 % 33.6(L) 33.0(L) 39.6  Platelets 150 - 400 K/uL 153 149(L) 179  Assessment: Probably infectious colitis. Feels better. Stool studies pending. Will continue to monitor  ]

## 2015-02-28 NOTE — Discharge Summary (Signed)
Physician Discharge Summary  Misty [name] BMW:[id number] DOB: 04/22/1941 DOA: 02/26/2015  PCP: Rudi Heap, MD  Admit date: 02/26/2015 Discharge date: 02/28/2015  Time spent: 40 minutes  Recommendations for Outpatient Follow-up:  1. Follow up with Dr Karilyn Cota as scheduled  2. Advance diet slowly  Discharge Diagnoses:  Principal Problem:   Hematochezia Active Problems:   GERD (gastroesophageal reflux disease)   Hyperlipidemia   Ischemic colitis   Orthostatic hypotension   Colitis   Discharge Condition: stable  Diet recommendation: heart healthy  Filed Weights   02/26/15 0941 02/26/15 1717  Weight: 56.246 kg (124 lb) 54.885 kg (121 lb)    History of present illness:  Misty [name] is a 74 y.o. female with a past medical history of gastroesophageal reflux disease having intermittent bright red blood per rectum, who underwent colonoscopy on 02/17/2015, procedure performed by Dr.Rehman of gastroenterology. This procedure showed focal edema and erythema with linear erosions at the proximal sigmoid colon with scattered diverticula. Biopsies were taken from sigmoid colon showed benign colonic mucosa with ischemic changes, no evidence of malignancy or active inflammation. She presented to the emergency room on 02/26/15 with complaints of bright red blood per rectum progressively worsening over the past 48 hours. He states symptoms initially started out as diarrhea having liquid consistency stools then noted her stools to turn her bloody. She also complained of associated abdominal pain mainly located in the lower abdominal region. She denied fevers, chills, nausea, vomiting, hematemesis. She was further worked up with a CT scan of abdomen and pelvis in the emergency room that revealed diffuse colitis extending from the rectum to the ascending colon. Radiology did report significant atherosclerotic changes causing stenosis at the origins of the celiac and SMA. Hospital  Course:  1. BRBPR secondary to colitis likely infectious confirmed by CT. Recent colonoscopy on 02/17/2015 procedure performed by Dr.Rehman of gastroenterology. Evaluated by Dr Karilyn Cota who opined not appear to be ischemic and recommends CT angiogram at later date. Await stool to be sent to lab. Suppository given on day of discharge as no stool since admission. remained afebrile, no leukocytosis and non-toxic appearing. No indication for antibiotics. Follow up with Dr Karilyn Cota 2 weeks 2. Diarrhea. Patient reporting watery diarrhea that progressively became bloody. No further stools since admission. Continue enteric precautions for now. Tolerating diet on discharge.  3. Orthostatic hypotension. Likely due to hypovolemia/dehydration given multiple episodes of liquid consistency diarrhea and bloody stools. Resolved at discharge.  4. Possible ischemic colitis. See #1. Recommend CT angiogram at later date per GI  5. Gastroesophageal reflux disease. Continue PPI. Tolerating diet at discharge 6. DVT prophylaxis. SCDs  Procedures:  none  Consultations:  Dr Karilyn Cota gastroenterology  Discharge Exam: Filed Vitals:   02/28/15 0526  BP: 122/68  Pulse: 79  Temp: 98.4 F (36.9 C)  Resp: 18    General: well nourished appears well Cardiovascular: RRR no MGR No LE edema Respiratory: normal effort BS clear bilaterally no wheeze Abdomen: non-distended +BS soft mild tenderness to palpation lower quadrants  Discharge Instructions    Current Discharge Medication List    START taking these medications   Details  pantoprazole (PROTONIX) 40 MG tablet Take 1 tablet (40 mg total) by mouth daily. Qty: 30 tablet, Refills: 0      CONTINUE these medications which have NOT CHANGED   Details  acetaminophen (TYLENOL) 500 MG tablet Take 500 mg by mouth every 6 (six) hours as needed for moderate pain or headache.     ALPRAZolam (  XANAX) 0.5 MG tablet Take 1 tablet (0.5 mg total) by mouth 3 (three) times daily  as needed. for anxiety Qty: 90 tablet, Refills: 3    cholecalciferol (VITAMIN D) 1000 UNITS tablet Take 2,000-4,000 Units by mouth daily. Take 2000 units daily except take 4000 units on Saturday and Sunday.    loperamide (IMODIUM A-D) 2 MG tablet Take 2 mg by mouth as needed for diarrhea or loose stools.    Multiple Vitamins-Minerals (CENTRUM SILVER PO) Take 1 tablet by mouth daily.    polyethylene glycol powder (GLYCOLAX/MIRALAX) powder Take 17 g by mouth at bedtime. Qty: 500 g, Refills: 5    ranitidine (ZANTAC) 150 MG tablet Take 150 mg by mouth 2 (two) times daily.     vitamin B-12 (CYANOCOBALAMIN) 1000 MCG tablet Take 1,000 mcg by mouth daily.    Wheat Dextrin (BENEFIBER) POWD Take 4 g by mouth at bedtime. Refills: 0       Allergies  Allergen Reactions  . Codeine Nausea And Vomiting  . Asa [Aspirin] Other (See Comments)    Patient is unaware of allergy  . Azithromycin Other (See Comments)    Patient is unaware of allergy  . Celebrex [Celecoxib] Other (See Comments)    Irritated stomach   . Cymbalta [Duloxetine Hcl] Other (See Comments)    Felt groggy  . Prednisone Rash    She has seen the podiatrist and he had injected cortisone in her feet and she had also taken a peel at home. She had a severe reaction to her face with a rash and had to take Benadryl to resolve the symptom. She actually did get more cortisone shots, but no additional reactions to the shots.  . Vioxx [Rofecoxib] Other (See Comments)    Unknown  . Zelnorm [Tegaserod] Other (See Comments)    Patient is unaware of allergy  . Zocor [Simvastatin] Other (See Comments)    Patient is unaware of allergy      The results of significant diagnostics from this hospitalization (including imaging, microbiology, ancillary and laboratory) are listed below for reference.    Significant Diagnostic Studies: Ct Abdomen Pelvis W Contrast  02/26/2015   CLINICAL DATA:  Diarrhea and rectal bleeding 1 day ago and today,  colonoscopy 2 days ago, history of 6 months of constipation and intermittent GI bleed, rectal bleeding for 2 days, possible colitis at colonoscopy  EXAM: CT ABDOMEN AND PELVIS WITH CONTRAST  TECHNIQUE: Multidetector CT imaging of the abdomen and pelvis was performed using the standard protocol following bolus administration of intravenous contrast. Sagittal and coronal MPR images reconstructed from axial data set.  CONTRAST:  OMNIPAQUE IOHEXOL 300 MG/ML SOLN IV. Dilute oral contrast.  COMPARISON:  None  FINDINGS: Mild atelectasis at RIGHT lung base.  Low-attenuation hepatic nodules questions cysts largest 13 mm greatest diameter.  Extrarenal pelves bilaterally without gross hydronephrosis.  Liver, spleen, pancreas, kidneys, and adrenal glands otherwise normal.  Diffuse wall thickening of the colon from the proximal transverse colon through rectum with additional mild patchy wall thickening at the ascending colon compatible with colitis.  Mild associated pericolic inflammatory/infiltrative changes at the descending and sigmoid colon.  No evidence of perforation or abscess.  Normal appendix.  Stomach and small bowel loops unremarkable.  Extensive atherosclerotic calcifications aorta with moderate thrombus at the level of the the aortic hiatus.  Stenoses noted at origins of SMA and celiac artery and no definite IMA is visualized.  No mass, adenopathy, free air, or ascites.  Bones diffusely demineralized.  IMPRESSION: Diffuse colitis extending from the rectum to the ascending colon ; differential diagnosis includes inflammatory bowel disease and infection, and ischemia, ischemia typically less likely due the length of involvement though this patient does have significant atherosclerotic changes causing stenoses at the origins of the celiac artery and SMA with nonvisualization of IMA.   Electronically Signed   By: Ulyses Southward M.D.   On: 02/26/2015 14:49    Microbiology: No results found for this or any previous  visit (from the past 240 hour(s)).   Labs: Basic Metabolic Panel:  Recent Labs Lab 02/26/15 0955 02/27/15 0550 02/28/15 0538  NA 139 142 143  K 3.7 3.3* 3.7  CL 102 109 110  CO2 GLUCOSE 137* 101* 97  BUN 10 <5* 5*  CREATININE 0.83 0.66 0.67  CALCIUM 8.7 8.1* 8.3*   Liver Function Tests:  Recent Labs Lab 02/26/15 0955  AST 35  ALT 44*  ALKPHOS 104  BILITOT 0.6  PROT 6.6  ALBUMIN 3.7   No results for input(s): LIPASE, AMYLASE in the last 168 hours. No results for input(s): AMMONIA in the last 168 hours. CBC:  Recent Labs Lab 02/26/15 0955 02/27/15 0550 02/28/15 0538  WBC 9.2 5.0 5.0  NEUTROABS 7.6  --   --   HGB 12.9 10.7* 10.8*  HCT 39.6 33.0* 33.6*  MCV 96.6 96.5 97.1  PLT 179 149* 153   Cardiac Enzymes: No results for input(s): CKTOTAL, CKMB, CKMBINDEX, TROPONINI in the last 168 hours. BNP: BNP (last 3 results) No results for input(s): BNP in the last 8760 hours.  ProBNP (last 3 results) No results for input(s): PROBNP in the last 8760 hours.  CBG: No results for input(s): GLUCAP in the last 168 hours.     SignedGwenyth Bender  [facility] 02/28/2015, 12:18 PM

## 2015-02-28 NOTE — Progress Notes (Signed)
D/c instructions reviewed with patient. Verbalized understanding.  Pt dc'd to home with grandson. [place], Candelaria Stagers 02/28/2015

## 2015-03-07 ENCOUNTER — Telehealth (INDEPENDENT_AMBULATORY_CARE_PROVIDER_SITE_OTHER): Payer: Self-pay | Admitting: *Deleted

## 2015-03-07 NOTE — Telephone Encounter (Signed)
Misty [name] brought a stool sample for testing. Jessica from [employer] said the sample should be diarrhea for a GI Pathogen to be done. The sample given was a solid. Per Dr. Karilyn Cota, through the stool away and he will address with patient if she has diarrhea. Patient called, voices understood.

## 2015-03-16 ENCOUNTER — Telehealth (INDEPENDENT_AMBULATORY_CARE_PROVIDER_SITE_OTHER): Payer: Self-pay | Admitting: *Deleted

## 2015-03-16 NOTE — Telephone Encounter (Signed)
If she has another loose stool, we will get a stool sample.

## 2015-03-16 NOTE — Telephone Encounter (Signed)
Misty [name] said every since she had her TCS her bowel movements have been hard. Yesterday she had a good bowel movement and hardly could make it to the bathroom. Everytime she eats, it makes her stomach hurt. She is very concerned. Please return her call at [phone].

## 2015-03-17 ENCOUNTER — Telehealth (INDEPENDENT_AMBULATORY_CARE_PROVIDER_SITE_OTHER): Payer: Self-pay | Admitting: *Deleted

## 2015-03-17 DIAGNOSIS — R197 Diarrhea, unspecified: Secondary | ICD-10-CM

## 2015-03-17 NOTE — Telephone Encounter (Signed)
Per Dr.Rehman the patient will need to have labs drawn. 

## 2015-04-19 ENCOUNTER — Ambulatory Visit (INDEPENDENT_AMBULATORY_CARE_PROVIDER_SITE_OTHER): Payer: Medicare Other | Admitting: Internal Medicine

## 2015-04-19 ENCOUNTER — Encounter (INDEPENDENT_AMBULATORY_CARE_PROVIDER_SITE_OTHER): Payer: Self-pay | Admitting: Internal Medicine

## 2015-04-19 VITALS — BP 92/58 | HR 76 | Temp 98.4°F | Ht 67.0 in | Wt 119.4 lb

## 2015-04-19 DIAGNOSIS — R195 Other fecal abnormalities: Secondary | ICD-10-CM | POA: Diagnosis not present

## 2015-04-19 DIAGNOSIS — R1084 Generalized abdominal pain: Secondary | ICD-10-CM | POA: Diagnosis not present

## 2015-04-19 LAB — CBC WITH DIFFERENTIAL/PLATELET
Basophils Absolute: 0.1 10*3/uL (ref 0.0–0.1)
Basophils Relative: 1 % (ref 0–1)
EOS ABS: 0.3 10*3/uL (ref 0.0–0.7)
Eosinophils Relative: 5 % (ref 0–5)
HEMATOCRIT: 36.2 % (ref 36.0–46.0)
HEMOGLOBIN: 11.9 g/dL — AB (ref 12.0–15.0)
LYMPHS ABS: 1.5 10*3/uL (ref 0.7–4.0)
LYMPHS PCT: 24 % (ref 12–46)
MCH: 30.4 pg (ref 26.0–34.0)
MCHC: 32.9 g/dL (ref 30.0–36.0)
MCV: 92.3 fL (ref 78.0–100.0)
MPV: 11.6 fL (ref 8.6–12.4)
Monocytes Absolute: 0.5 10*3/uL (ref 0.1–1.0)
Monocytes Relative: 8 % (ref 3–12)
NEUTROS PCT: 62 % (ref 43–77)
Neutro Abs: 3.9 10*3/uL (ref 1.7–7.7)
Platelets: 234 10*3/uL (ref 150–400)
RBC: 3.92 MIL/uL (ref 3.87–5.11)
RDW: 14.1 % (ref 11.5–15.5)
WBC: 6.3 10*3/uL (ref 4.0–10.5)

## 2015-04-19 NOTE — Progress Notes (Signed)
Subjective:    Patient ID: Misty [name], female    DOB: 11/17/1941, 73 y.o.   MRN: [id number]  HPI Here today for f/u after recent colonoscopy and hospital admission. Admitted 02/27/2015 with abdominal pain, rectal bleeding and diarrhea. Rectal bleeding and diarrhea resolved. She says she has symptoms x 1 year.   She never gave a stool sample for a GI pathogen. She presents today with c/o that Saturday thru Monday with small stools. Stools were formed. She has not had any diarrhea. She says she has not had a regular BM in a long time.  When she has a BM, she will have lower abdominal pain. She is not taking the Miralax anymore.   She says she doesn't have a appetite due to her schedule.  Her last weight in April was 121. Today here weight is 119.4.  She continues to smoke 1/2 pack to 1 pack a day.      02/27/2015 CT abdomen/pelvis with CM:  IMPRESSION: Diffuse colitis extending from the rectum to the ascending colon ; differential diagnosis includes inflammatory bowel disease and  infection, and ischemia, ischemia typically less likely due the length of involvement though this patient does have significant atherosclerotic changes causing stenoses at the origins of the celiac artery and SMA with nonvisualization of IMA.    02/17/2015 Colonoscopy  Indications: Patient is 74 year old Caucasian female with multiple medical problems who presents with over 6 months history of constipation and she has intermittent hematochezia felt to be secondary to hemorrhoids. She was recently seen in the office and noted to have heme-positive stool. Last colonoscopy was in 2002. Impression:  Examination performed to cecum. Focal colitis at proximal sigmoid colon which appears to be resolving colitis probably ischemic. Biopsies taken. Mild sigmoid colon diverticulosis. No evidence of colonic stricture. Small external hemorrhoids. Biopsy: No evidence of active inflammation, chronicity, dysplasia, or  malignancy.    01/31/2014 MRI abdomen/w/wo: weight loss  IMPRESSION: Numerous scattered benign hepatic cysts.   Focal area of scarring change involving the medial segment of the left hepatic lobe near the falciform ligament with capsular retraction and a small defect in the liver. No worrisome hepatic lesions.  High T1 material in the gallbladder is most likely cholesterol laden   CBC    Component Value Date/Time   WBC 5.0 02/28/2015 0538   WBC 5.7 01/09/2015 0954   RBC 3.46* 02/28/2015 0538   RBC 4.20 01/09/2015 0954   HGB 10.8* 02/28/2015 0538   HGB 12.0* 01/09/2015 0954   HCT 33.6* 02/28/2015 0538   HCT 39.5 01/09/2015 0954   PLT 153 02/28/2015 0538   MCV 97.1 02/28/2015 0538   MCV 93.9 01/09/2015 0954   MCH 31.2 02/28/2015 0538   MCH 28.6 01/09/2015 0954   MCHC 32.1 02/28/2015 0538   MCHC 30.4* 01/09/2015 0954   RDW 13.7 02/28/2015 0538   LYMPHSABS 0.8 02/26/2015 0955   MONOABS 0.8 02/26/2015 0955   EOSABS 0.1 02/26/2015 0955   BASOSABS 0.0 02/26/2015 0955    CMP Latest Ref Rng 02/28/2015 02/27/2015 02/26/2015  Glucose 70 - 99 mg/dL 97 010(U) 725(D)  BUN 6 - 23 mg/dL 5(L) <6(U) 10  Creatinine 0.50 - 1.10 mg/dL 4.40 3.47 4.25  Sodium 135 - 145 mmol/L 143 142 139  Potassium 3.5 - 5.1 mmol/L 3.7 3.3(L) 3.7  Chloride 96 - 112 mmol/L 110 109 102  CO2 19 - 32 mmol/L Calcium 8.4 - 10.5 mg/dL 8.3(L) 8.1(L) 8.7  Total Protein 6.0 -  8.3 g/dL - - 6.6  Albumin 3.5 - 4.8 g/dL - - -  Total Bilirubin 0.3 - 1.2 mg/dL - - 0.6  Alkaline Phos 39 - 117 U/L - - 104  AST 0 - 37 U/L - - 35  ALT 0 - 35 U/L - - 44(H)         Review of Systems Past Medical History  Diagnosis Date  . Migraines   . Osteoporosis   . Hyperlipidemia   . PONV (postoperative nausea and vomiting)   . History of bronchitis   . History of migraine     many yrs ago  . Numbness     left leg  . Chronic back pain   . Arthritis     back  . GERD (gastroesophageal reflux disease)      takes Ranidine daily  . Constipation     OTC stool softener prn  . Hemorrhoids   . Diverticulosis   . Urinary frequency   . Urinary urgency   . Nocturia   . Depression   . Anxiety     takes Xanax daily  . Insomnia   . History of shingles   . Cataract   . COPD (chronic obstructive pulmonary disease)     Past Surgical History  Procedure Laterality Date  . Abdominal hysterectomy    . Esophagogastroduodenoscopy    . Colonoscopy    . Lumbar laminectomy/decompression microdiscectomy Bilateral 05/20/2013    Procedure: LUMBAR LAMINECTOMY/DECOMPRESSION MICRODISCECTOMY 1 LEVEL;  Surgeon: Hewitt Shorts, MD;  Location: [facility];  Service: Neurosurgery;  Laterality: Bilateral;  Bilateral Lumbar four-five laminotomy and left lumbar four-five microdiskectomy  . Eye surgery Right 3/15    cataracts  . Kyphoplasty N/A 07/08/2014    Procedure: Thoracic Eleven Kyphoplasty;  Surgeon: Hewitt Shorts, MD;  Location: [facility];  Service: Neurosurgery;  Laterality: N/A;  . Colonoscopy N/A 02/17/2015    Procedure: COLONOSCOPY;  Surgeon: Malissa Hippo, MD;  Location: [facility];  Service: Endoscopy;  Laterality: N/A;  110n - moved to 11:15 - Ann to notify pt    Allergies  Allergen Reactions  . Codeine Nausea And Vomiting  . Asa [Aspirin] Other (See Comments)    Patient is unaware of allergy  . Azithromycin Other (See Comments)    Patient is unaware of allergy  . Celebrex [Celecoxib] Other (See Comments)    Irritated stomach   . Cymbalta [Duloxetine Hcl] Other (See Comments)    Felt groggy  . Prednisone Rash    She has seen the podiatrist and he had injected cortisone in her feet and she had also taken a peel at home. She had a severe reaction to her face with a rash and had to take Benadryl to resolve the symptom. She actually did get more cortisone shots, but no additional reactions to the shots.  . Vioxx [Rofecoxib] Other (See Comments)    Unknown  . Zelnorm [Tegaserod] Other  (See Comments)    Patient is unaware of allergy  . Zocor [Simvastatin] Other (See Comments)    Patient is unaware of allergy    Current Outpatient Prescriptions on File Prior to Visit  Medication Sig Dispense Refill  . acetaminophen (TYLENOL) 500 MG tablet Take 500 mg by mouth every 6 (six) hours as needed for moderate pain or headache.     . ALPRAZolam (XANAX) 0.5 MG tablet Take 1 tablet (0.5 mg total) by mouth 3 (three) times daily as needed. for anxiety 90 tablet 3  .  cholecalciferol (VITAMIN D) 1000 UNITS tablet Take 2,000-4,000 Units by mouth daily. Take 2000 units daily except take 4000 units on Saturday and Sunday.    . loperamide (IMODIUM A-D) 2 MG tablet Take 2 mg by mouth as needed for diarrhea or loose stools.    . pantoprazole (PROTONIX) 40 MG tablet Take 1 tablet (40 mg total) by mouth daily. 30 tablet 0  . polyethylene glycol powder (GLYCOLAX/MIRALAX) powder Take 17 g by mouth at bedtime. 500 g 5  . ranitidine (ZANTAC) 150 MG tablet Take 150 mg by mouth 2 (two) times daily.     . vitamin B-12 (CYANOCOBALAMIN) 1000 MCG tablet Take 1,000 mcg by mouth daily.    . Wheat Dextrin (BENEFIBER) POWD Take 4 g by mouth at bedtime.  0  . Multiple Vitamins-Minerals (CENTRUM SILVER PO) Take 1 tablet by mouth daily.     No current facility-administered medications on file prior to visit.        Objective:   Physical Exam Blood pressure 92/58, pulse 76, temperature 98.4 F (36.9 C), height 5\' 7"  (1.702 m), weight 119 lb 6.4 oz (54.159 kg).  Alert and oriented. Skin warm and dry. Oral mucosa is moist.   . Sclera anicteric, conjunctivae is pink. Thyroid not enlarged. No cervical lymphadenopathy. Lungs clear. Heart regular rate and rhythm.  Abdomen is soft. Bowel sounds are positive. No hepatomegaly. No abdominal masses felt. Slight tenderness to lower abdomen.  No edema to lower extremities.         Assessment & Plan:  Change in her stool. . Abdominal pain.   Will get a CT ango  abdomen/pelvis with CM. CBC, CMET today.  Miralax 1/2 scoop daily  Further recommendations to follow.

## 2015-04-19 NOTE — Patient Instructions (Signed)
Miralax 1/2 scoop daily.  CTA abdomne/pelvis with CM.  CBC and cmet today.

## 2015-04-20 LAB — COMPREHENSIVE METABOLIC PANEL
ALK PHOS: 115 U/L (ref 39–117)
ALT: 15 U/L (ref 0–35)
AST: 19 U/L (ref 0–37)
Albumin: 3.8 g/dL (ref 3.5–5.2)
BUN: 10 mg/dL (ref 6–23)
CO2: 27 mEq/L (ref 19–32)
Calcium: 9.1 mg/dL (ref 8.4–10.5)
Chloride: 104 mEq/L (ref 96–112)
Creat: 0.75 mg/dL (ref 0.50–1.10)
Glucose, Bld: 114 mg/dL — ABNORMAL HIGH (ref 70–99)
Potassium: 4.1 mEq/L (ref 3.5–5.3)
SODIUM: 142 meq/L (ref 135–145)
TOTAL PROTEIN: 6.2 g/dL (ref 6.0–8.3)
Total Bilirubin: 0.3 mg/dL (ref 0.2–1.2)

## 2015-04-26 ENCOUNTER — Other Ambulatory Visit (HOSPITAL_COMMUNITY): Payer: Medicare Other

## 2015-05-18 ENCOUNTER — Encounter: Payer: Self-pay | Admitting: Family Medicine

## 2015-05-18 ENCOUNTER — Ambulatory Visit (INDEPENDENT_AMBULATORY_CARE_PROVIDER_SITE_OTHER): Payer: Medicare Other | Admitting: Family Medicine

## 2015-05-18 VITALS — BP 116/66 | HR 80 | Temp 97.4°F | Ht 67.0 in | Wt 124.0 lb

## 2015-05-18 DIAGNOSIS — E8881 Metabolic syndrome: Secondary | ICD-10-CM | POA: Diagnosis not present

## 2015-05-18 DIAGNOSIS — K219 Gastro-esophageal reflux disease without esophagitis: Secondary | ICD-10-CM | POA: Diagnosis not present

## 2015-05-18 DIAGNOSIS — R634 Abnormal weight loss: Secondary | ICD-10-CM | POA: Diagnosis not present

## 2015-05-18 DIAGNOSIS — E559 Vitamin D deficiency, unspecified: Secondary | ICD-10-CM | POA: Diagnosis not present

## 2015-05-18 DIAGNOSIS — F4322 Adjustment disorder with anxiety: Secondary | ICD-10-CM

## 2015-05-18 DIAGNOSIS — E785 Hyperlipidemia, unspecified: Secondary | ICD-10-CM | POA: Diagnosis not present

## 2015-05-18 MED ORDER — ALPRAZOLAM 0.5 MG PO TABS: 0.5000 mg | ORAL_TABLET | Freq: Three times a day (TID) | ORAL | Status: DC | PRN

## 2015-05-18 NOTE — Patient Instructions (Addendum)
Medicare Annual Wellness Visit  [place] and the medical providers at [facility] strive to bring you the best medical care.  In doing so we not only want to address your current medical conditions and concerns but also to detect new conditions early and prevent illness, disease and health-related problems.    Medicare offers a yearly Wellness Visit which allows our clinical staff to assess your need for preventative services including immunizations, lifestyle education, counseling to decrease risk of preventable diseases and screening for fall risk and other medical concerns.    This visit is provided free of charge (no copay) for all Medicare recipients. The clinical pharmacists at [facility] have begun to conduct these Wellness Visits which will also include a thorough review of all your medications.    As you primary medical provider recommend that you make an appointment for your Annual Wellness Visit if you have not done so already this year.  You may set up this appointment before you leave today or you may call back ([phone]) and schedule an appointment.  Please make sure when you call that you mention that you are scheduling your Annual Wellness Visit with the clinical pharmacist so that the appointment may be made for the proper length of time.     Continue current medications. Continue good therapeutic lifestyle changes which include good diet and exercise. Fall precautions discussed with patient. If an FOBT was given today- please return it to our front desk. If you are over 46 years old - you may need Prevnar 13 or the adult Pneumonia vaccine.  Flu Shots are still available at our office. If you still haven't had one please call to set up a nurse visit to get one.   After your visit with [place] today you will receive a survey in the mail or online from [employer] regarding your care with [place]. Please take a moment to  fill this out. Your feedback is very important to [place] as you can help [place] better understand your patient needs as well as improve your experience and satisfaction. WE CARE ABOUT YOU!!!   The patient should follow-up with getting her mammogram as soon as possible. She also needs to follow-up with getting her cataract repair.

## 2015-05-18 NOTE — Progress Notes (Signed)
Subjective:    Patient ID: Misty [name], female    DOB: May 11, 1941, 74 y.o.   MRN: [id number]  HPI Pt here for follow up and management of chronic medical problems which includes hyperlipidemia, and GERD. She is taking medications regularly. The patient complains of fatigue. She had a recent colonoscopy and bleeding following the colonoscopy and she had to go back in the hospital for this. On health maintenance issues she is due to schedule a mammogram and she says that she will schedule this. She will also get lab work today. She requests a refill on her Xanax. The patient remains very stressed because of dealing with health issues with her husband and with her son who may be coming home from prison after many years of being in prison. She's been followed by the gastroenterologist and still has follow-up visit scheduled with him. She is also due to get a cataract repaired and get her mammogram. She denies chest pain or shortness of breath and still has problems with her reflux but this is controlled with her proton pump inhibitor. She denies any problems with voiding. She remains concerned about not being able to gain weight.     Patient Active Problem List   Diagnosis Date Noted  . Colitis   . Hematochezia 02/26/2015  . Ischemic colitis 02/26/2015  . Orthostatic hypotension 02/26/2015  . Non-traumatic compression fracture of T11 thoracic vertebra 07/08/2014  . Hyperlipidemia 08/19/2013  . Fibrocystic breast disease 08/19/2013  . History of migraine headaches 08/19/2013  . GERD (gastroesophageal reflux disease) 04/07/2013  . Osteoporosis, postmenopausal 04/07/2013   Outpatient Encounter Prescriptions as of 05/18/2015  Medication Sig  . acetaminophen (TYLENOL) 500 MG tablet Take 500 mg by mouth every 6 (six) hours as needed for moderate pain or headache.   . ALPRAZolam (XANAX) 0.5 MG tablet Take 1 tablet (0.5 mg total) by mouth 3 (three) times daily as needed. for anxiety  .  cholecalciferol (VITAMIN D) 1000 UNITS tablet Take 2,000-4,000 Units by mouth daily. Take 2000 units daily except take 4000 units on Saturday and Sunday.  . loperamide (IMODIUM A-D) 2 MG tablet Take 2 mg by mouth as needed for diarrhea or loose stools.  . Multiple Vitamins-Minerals (CENTRUM SILVER PO) Take 1 tablet by mouth daily.  . pantoprazole (PROTONIX) 40 MG tablet Take 1 tablet (40 mg total) by mouth daily.  . polyethylene glycol powder (GLYCOLAX/MIRALAX) powder Take 17 g by mouth at bedtime.  . ranitidine (ZANTAC) 150 MG tablet Take 150 mg by mouth 2 (two) times daily.   . vitamin B-12 (CYANOCOBALAMIN) 1000 MCG tablet Take 1,000 mcg by mouth daily.  . Wheat Dextrin (BENEFIBER) POWD Take 4 g by mouth at bedtime.   No facility-administered encounter medications on file as of 05/18/2015.     Review of Systems  Constitutional: Positive for fatigue and unexpected weight change (loss ).  HENT: Negative.   Eyes: Negative.   Respiratory: Negative.   Cardiovascular: Negative.   Gastrointestinal: Negative.        Recent hosp stay for rectal bleed - after colonoscopy.  Endocrine: Negative.   Genitourinary: Negative.   Musculoskeletal: Negative.   Skin: Negative.   Allergic/Immunologic: Negative.   Neurological: Negative.   Hematological: Negative.   Psychiatric/Behavioral: Negative.        Objective:   Physical Exam  Constitutional: She is oriented to person, place, and time. She appears distressed.  Thin pleasant but stress because of health issues and home situation.  HENT:  [name] Normocephalic and atraumatic.  Nose: Nose normal.  Mouth/Throat: Oropharynx is clear and moist.  Bilateral ears cerumen  Eyes: Conjunctivae and EOM are normal. Pupils are equal, round, and reactive to light. Right eye exhibits no discharge. Left eye exhibits no discharge. No scleral icterus.  Pending removal of cataract  Neck: Normal range of motion. Neck supple. No thyromegaly present.    Cardiovascular: Normal rate, regular rhythm and intact distal pulses.  Exam reveals friction rub.   No murmur heard. The heart is regular at 72/m  Pulmonary/Chest: Effort normal. No respiratory distress. She has wheezes. She has no rales. She exhibits no tenderness.  Occasional expiratory wheeze  Abdominal: Soft. Bowel sounds are normal. She exhibits no mass. There is no tenderness. There is no rebound and no guarding.  No abdominal tenderness or masses  Musculoskeletal: Normal range of motion. She exhibits no edema or tenderness.  The patient still has some limited range of motion with her back and previous surgery.  Lymphadenopathy:    She has no cervical adenopathy.  Neurological: She is alert and oriented to person, place, and time. She has normal reflexes. No cranial nerve deficit.  Skin: Skin is warm and dry. No rash noted.  Psychiatric: She has a normal mood and affect. Her behavior is normal. Judgment and thought content normal.  Stress  Nursing note and vitals reviewed.  BP 116/66 mmHg  Pulse 80  Temp(Src) 97.4 F (36.3 C) (Oral)  Ht 5' 7"  (1.702 m)  Wt 124 lb (56.246 kg)  BMI 19.42 kg/m2  The ears will be irrigated today to remove cerumen from obstructing the ear canals.      Assessment & Plan:  1. Hyperlipidemia -The patient should continue with aggressive therapeutic lifestyle changes pending results of lab work - Lipid panel - BMP8+EGFR - Hepatic function panel  2. Gastroesophageal reflux disease, esophagitis presence not specified -The patient is doing well with this at this time but still has occasional heartburn and we'll continue to take Zantac - Hepatic function panel  3. Vitamin D deficiency -She should continue with current vitamin D replacement pending results of lab work - Vit D  25 hydroxy (rtn osteoporosis monitoring)  4. Metabolic syndrome - JOA4+ZYSA  5. Adjustment disorder with anxious mood -Continue with alprazolam as needed  6. Loss of  weight -Continue to discuss: Issues with the gastroenterologist and any further suggestions for weight gain from him would be helpful.  Meds ordered this encounter  Medications  . ALPRAZolam (XANAX) 0.5 MG tablet    Sig: Take 1 tablet (0.5 mg total) by mouth 3 (three) times daily as needed. for anxiety    Dispense:  90 tablet    Refill:  3   Patient Instructions                       Medicare Annual Wellness Visit  [facility] and the medical providers at [facility] strive to bring you the best medical care.  In doing so we not only want to address your current medical conditions and concerns but also to detect new conditions early and prevent illness, disease and health-related problems.    Medicare offers a yearly Wellness Visit which allows our clinical staff to assess your need for preventative services including immunizations, lifestyle education, counseling to decrease risk of preventable diseases and screening for fall risk and other medical concerns.    This visit is provided free of charge (no copay) for all  Medicare recipients. The clinical pharmacists at [facility] have begun to conduct these Wellness Visits which will also include a thorough review of all your medications.    As you primary medical provider recommend that you make an appointment for your Annual Wellness Visit if you have not done so already this year.  You may set up this appointment before you leave today or you may call back ([phone]) and schedule an appointment.  Please make sure when you call that you mention that you are scheduling your Annual Wellness Visit with the clinical pharmacist so that the appointment may be made for the proper length of time.     Continue current medications. Continue good therapeutic lifestyle changes which include good diet and exercise. Fall precautions discussed with patient. If an FOBT was given today- please return it to our  front desk. If you are over 40 years old - you may need Prevnar 10 or the adult Pneumonia vaccine.  Flu Shots are still available at our office. If you still haven't had one please call to set up a nurse visit to get one.   After your visit with [place] today you will receive a survey in the mail or online from [employer] regarding your care with [place]. Please take a moment to fill this out. Your feedback is very important to [place] as you can help [place] better understand your patient needs as well as improve your experience and satisfaction. WE CARE ABOUT YOU!!!   The patient should follow-up with getting her mammogram as soon as possible. She also needs to follow-up with getting her cataract repair.   Arrie Senate MD

## 2015-05-19 LAB — HEPATIC FUNCTION PANEL
ALK PHOS: 120 IU/L — AB (ref 39–117)
ALT: 18 IU/L (ref 0–32)
AST: 19 IU/L (ref 0–40)
Albumin: 3.9 g/dL (ref 3.5–4.8)
BILIRUBIN, DIRECT: 0.08 mg/dL (ref 0.00–0.40)
Bilirubin Total: <0.2 mg/dL (ref 0.0–1.2)
Total Protein: 6.1 g/dL (ref 6.0–8.5)

## 2015-05-19 LAB — LIPID PANEL
Chol/HDL Ratio: 3.1 ratio units (ref 0.0–4.4)
Cholesterol, Total: 146 mg/dL (ref 100–199)
HDL: 47 mg/dL (ref >39)
LDL Calculated: 71 mg/dL (ref 0–99)
Triglycerides: 141 mg/dL (ref 0–149)
VLDL Cholesterol Cal: 28 mg/dL (ref 5–40)

## 2015-05-19 LAB — BMP8+EGFR
BUN/Creatinine Ratio: 14 (ref 11–26)
BUN: 11 mg/dL (ref 8–27)
CALCIUM: 8.8 mg/dL (ref 8.7–10.3)
CHLORIDE: 105 mmol/L (ref 97–108)
CO2: 23 mmol/L (ref 18–29)
CREATININE: 0.79 mg/dL (ref 0.57–1.00)
GFR calc Af Amer: 85 mL/min/{1.73_m2} (ref >59)
GFR, EST NON AFRICAN AMERICAN: 74 mL/min/{1.73_m2} (ref >59)
Glucose: 102 mg/dL — ABNORMAL HIGH (ref 65–99)
Potassium: 4.3 mmol/L (ref 3.5–5.2)
Sodium: 142 mmol/L (ref 134–144)

## 2015-05-19 LAB — VITAMIN D 25 HYDROXY (VIT D DEFICIENCY, FRACTURES): VIT D 25 HYDROXY: 45.6 ng/mL (ref 30.0–100.0)

## 2015-06-08 ENCOUNTER — Encounter: Payer: Self-pay | Admitting: *Deleted

## 2015-08-08 DIAGNOSIS — H2513 Age-related nuclear cataract, bilateral: Secondary | ICD-10-CM | POA: Diagnosis not present

## 2015-08-08 DIAGNOSIS — H538 Other visual disturbances: Secondary | ICD-10-CM | POA: Diagnosis not present

## 2015-08-24 ENCOUNTER — Other Ambulatory Visit: Payer: Self-pay | Admitting: Family Medicine

## 2015-08-25 NOTE — Telephone Encounter (Signed)
Called into pharmacy

## 2015-08-25 NOTE — Telephone Encounter (Signed)
Last written on 05/18/15 for #90 with 3 refills. Technically patient should have one more refill if this was called in correctly. I assume it wasn't since the pharmacy is the one requesting the refill.  If approved route to nurse to call into [facility].

## 2015-09-04 ENCOUNTER — Telehealth: Payer: Self-pay | Admitting: Family Medicine

## 2015-09-04 NOTE — Telephone Encounter (Signed)
Pt given appt tomorrow morning with Christy at 9:10.

## 2015-09-05 ENCOUNTER — Ambulatory Visit (INDEPENDENT_AMBULATORY_CARE_PROVIDER_SITE_OTHER): Payer: Medicare Other | Admitting: Family

## 2015-09-05 ENCOUNTER — Encounter: Payer: Self-pay | Admitting: Family

## 2015-09-05 VITALS — BP 113/67 | HR 89 | Temp 97.6°F | Ht 67.0 in | Wt 119.0 lb

## 2015-09-05 DIAGNOSIS — K2100 Gastro-esophageal reflux disease with esophagitis, without bleeding: Secondary | ICD-10-CM

## 2015-09-05 DIAGNOSIS — K529 Noninfective gastroenteritis and colitis, unspecified: Secondary | ICD-10-CM

## 2015-09-05 DIAGNOSIS — R9431 Abnormal electrocardiogram [ECG] [EKG]: Secondary | ICD-10-CM

## 2015-09-05 DIAGNOSIS — R531 Weakness: Secondary | ICD-10-CM

## 2015-09-05 DIAGNOSIS — R42 Dizziness and giddiness: Secondary | ICD-10-CM

## 2015-09-05 DIAGNOSIS — R5383 Other fatigue: Secondary | ICD-10-CM | POA: Diagnosis not present

## 2015-09-05 DIAGNOSIS — R002 Palpitations: Secondary | ICD-10-CM

## 2015-09-05 LAB — POCT HEMOGLOBIN: Hemoglobin: 12.4 g/dL (ref 12.2–16.2)

## 2015-09-05 MED ORDER — PANTOPRAZOLE SODIUM 40 MG PO TBEC: 40.0000 mg | DELAYED_RELEASE_TABLET | Freq: Every day | ORAL | Status: DC

## 2015-09-05 MED ORDER — MECLIZINE HCL 50 MG PO TABS: 50.0000 mg | ORAL_TABLET | Freq: Three times a day (TID) | ORAL | Status: DC | PRN

## 2015-09-05 NOTE — Progress Notes (Signed)
Subjective:    Patient ID: Misty Misty [name], female    DOB: 12/14/1940, 74 y.o.   MRN: [id number]  Gastroesophageal Reflux She complains of belching and heartburn. She reports no coughing or no nausea. This is a chronic problem. The current episode started more than 1 year ago. The problem occurs frequently. The problem has been waxing and waning. The heartburn is located in the substernum. The heartburn wakes her from sleep. The symptoms are aggravated by lying down and certain foods. Associated symptoms include fatigue. She has tried an antacid for the symptoms. The treatment provided mild relief.  Dizziness This is a recurrent problem. The current episode started 1 to 4 weeks ago. The problem occurs intermittently. The problem has been waxing and waning. Associated symptoms include a change in bowel habit, fatigue, myalgias, numbness, vomiting and weakness. Pertinent negatives include no coughing, diaphoresis, fever, headaches, nausea or urinary symptoms. Nothing aggravates the symptoms. She has tried drinking and rest for the symptoms. The treatment provided mild relief.      Review of Systems  Constitutional: Positive for fatigue. Negative for fever and diaphoresis.  HENT: Negative.   Eyes: Negative.   Respiratory: Negative.  Negative for cough and shortness of breath.   Cardiovascular: Negative.  Negative for palpitations.  Gastrointestinal: Positive for heartburn, vomiting and change in bowel habit. Negative for nausea.  Endocrine: Negative.   Genitourinary: Negative.   Musculoskeletal: Positive for myalgias.  Neurological: Positive for dizziness, weakness and numbness. Negative for headaches.  Hematological: Negative.   Psychiatric/Behavioral: Negative.   All other systems reviewed and are negative.      Objective:   Physical Exam  Constitutional: She is oriented to person, place, and time. She appears well-developed and well-nourished. No distress.  HENT:  Misty [name]  Normocephalic and atraumatic.  Right Ear: External ear normal.  Left Ear: External ear normal.  Nose: Nose normal.  Mouth/Throat: Oropharynx is clear and moist.  Eyes: Pupils are equal, round, and reactive to light.  Neck: Normal range of motion. Neck supple. No thyromegaly present.  Cardiovascular: Normal rate, regular rhythm, normal heart sounds and intact distal pulses.   No murmur heard. Pulmonary/Chest: Effort normal and breath sounds normal. No respiratory distress. She has no wheezes.  Abdominal: Soft. Bowel sounds are normal. She exhibits no distension. There is no tenderness.  Musculoskeletal: Normal range of motion. She exhibits no edema or tenderness.  Neurological: She is alert and oriented to person, place, and time. She has normal reflexes. No cranial nerve deficit.  Skin: Skin is warm and dry.  Psychiatric: She has a normal mood and affect. Her behavior is normal. Judgment and thought content normal.  Vitals reviewed.     BP 113/67 mmHg  Pulse 89  Temp(Src) 97.6 F (36.4 C) (Oral)  Ht _0  (1.702 m)  Wt 119 lb (53.978 kg)  BMI 18.63 kg/m2     Assessment & Plan:  1. Dizziness -Pt given Antivert as needed - CMP14+EGFR - CBC with Differential/Platelet - POCT hemoglobin - meclizine (ANTIVERT) 50 MG tablet; Take 1 tablet (50 mg total) by mouth 3 (three) times daily as needed.  Dispense: 30 tablet; Refill: 0 - EKG 12-Lead  2. Weakness - CMP14+EGFR - CBC with Differential/Platelet - POCT hemoglobin - EKG 12-Lead  3. Other fatigue - CMP14+EGFR - CBC with Differential/Platelet - POCT hemoglobin - EKG 12-Lead - Ambulatory referral to Cardiology  4. Colitis - CMP14+EGFR - CBC with Differential/Platelet - POCT hemoglobin  5. Vertigo - meclizine (ANTIVERT) 50  MG tablet; Take 1 tablet (50 mg total) by mouth 3 (three) times daily as needed.  Dispense: 30 tablet; Refill: 0  6. Gastroesophageal reflux disease with esophagitis -Protonix rx given to patient  today - pantoprazole (PROTONIX) 40 MG tablet; Take 1 tablet (40 mg total) by mouth daily.  Dispense: 90 tablet; Refill: 1  7. Palpitations - EKG 12-Lead  8. Abnormal EKG - Ambulatory referral to Cardiology   Continue all meds Labs pending Health Maintenance reviewed Diet and exercise encouraged RTO 1 week  Evelina Dun, FNP

## 2015-09-05 NOTE — Patient Instructions (Signed)
Vertigo Vertigo means you feel like you or your surroundings are moving when they are not. Vertigo can be dangerous if it occurs when you are at work, driving, or performing difficult activities.  CAUSES  Vertigo occurs when there is a conflict of signals sent to your brain from the visual and sensory systems in your body. There are many different causes of vertigo, including:  Infections, especially in the inner ear.  A bad reaction to a drug or misuse of alcohol and medicines.  Withdrawal from drugs or alcohol.  Rapidly changing positions, such as lying down or rolling over in bed.  A migraine headache.  Decreased blood flow to the brain.  Increased pressure in the brain from a head injury, infection, tumor, or bleeding. SYMPTOMS  You may feel as though the world is spinning around or you are falling to the ground. Because your balance is upset, vertigo can cause nausea and vomiting. You may have involuntary eye movements (nystagmus). DIAGNOSIS  Vertigo is usually diagnosed by physical exam. If the cause of your vertigo is unknown, your caregiver may perform imaging tests, such as an MRI scan (magnetic resonance imaging). TREATMENT  Most cases of vertigo resolve on their own, without treatment. Depending on the cause, your caregiver may prescribe certain medicines. If your vertigo is related to body position issues, your caregiver may recommend movements or procedures to correct the problem. In rare cases, if your vertigo is caused by certain inner ear problems, you may need surgery. HOME CARE INSTRUCTIONS   Follow your caregiver's instructions.  Avoid driving.  Avoid operating heavy machinery.  Avoid performing any tasks that would be dangerous to you or others during a vertigo episode.  Tell your caregiver if you notice that certain medicines seem to be causing your vertigo. Some of the medicines used to treat vertigo episodes can actually make them worse in some people. SEEK  IMMEDIATE MEDICAL CARE IF:   Your medicines do not relieve your vertigo or are making it worse.  You develop problems with talking, walking, weakness, or using your arms, hands, or legs.  You develop severe headaches.  Your nausea or vomiting continues or gets worse.  You develop visual changes.  A family member notices behavioral changes.  Your condition gets worse. MAKE SURE YOU:  Understand these instructions.  Will watch your condition.  Will get help right away if you are not doing well or get worse.   This information is not intended to replace advice given to you by your health care provider. Make sure you discuss any questions you have with your health care provider.   Document Released: 08/14/2005 Document Revised: 01/27/2012 Document Reviewed: 02/27/2015 Elsevier Interactive Patient Education [employer].

## 2015-09-06 ENCOUNTER — Ambulatory Visit (INDEPENDENT_AMBULATORY_CARE_PROVIDER_SITE_OTHER): Payer: Medicare Other | Admitting: Internal Medicine

## 2015-09-06 LAB — CMP14+EGFR
A/G RATIO: 1.7 (ref 1.1–2.5)
ALT: 18 IU/L (ref 0–32)
AST: 20 IU/L (ref 0–40)
Albumin: 3.8 g/dL (ref 3.5–4.8)
Alkaline Phosphatase: 114 IU/L (ref 39–117)
BILIRUBIN TOTAL: 0.2 mg/dL (ref 0.0–1.2)
BUN/Creatinine Ratio: 11 (ref 11–26)
BUN: 8 mg/dL (ref 8–27)
CHLORIDE: 103 mmol/L (ref 97–106)
CO2: 25 mmol/L (ref 18–29)
Calcium: 9 mg/dL (ref 8.7–10.3)
Creatinine, Ser: 0.75 mg/dL (ref 0.57–1.00)
GFR calc non Af Amer: 79 mL/min/{1.73_m2} (ref >59)
GFR, EST AFRICAN AMERICAN: 91 mL/min/{1.73_m2} (ref >59)
Globulin, Total: 2.2 g/dL (ref 1.5–4.5)
Glucose: 111 mg/dL — ABNORMAL HIGH (ref 65–99)
POTASSIUM: 4.2 mmol/L (ref 3.5–5.2)
Sodium: 143 mmol/L (ref 136–144)
Total Protein: 6 g/dL (ref 6.0–8.5)

## 2015-09-06 LAB — CBC WITH DIFFERENTIAL/PLATELET
BASOS: 0 %
Basophils Absolute: 0 10*3/uL (ref 0.0–0.2)
EOS (ABSOLUTE): 0.2 10*3/uL (ref 0.0–0.4)
Eos: 3 %
Hematocrit: 38.4 % (ref 34.0–46.6)
Hemoglobin: 12 g/dL (ref 11.1–15.9)
Immature Grans (Abs): 0 10*3/uL (ref 0.0–0.1)
Immature Granulocytes: 0 %
Lymphocytes Absolute: 1.2 10*3/uL (ref 0.7–3.1)
Lymphs: 18 %
MCH: 29.4 pg (ref 26.6–33.0)
MCHC: 31.3 g/dL — AB (ref 31.5–35.7)
MCV: 94 fL (ref 79–97)
MONOS ABS: 0.5 10*3/uL (ref 0.1–0.9)
Monocytes: 8 %
NEUTROS ABS: 4.7 10*3/uL (ref 1.4–7.0)
Neutrophils: 71 %
PLATELETS: 179 10*3/uL (ref 150–379)
RBC: 4.08 x10E6/uL (ref 3.77–5.28)
RDW: 15.1 % (ref 12.3–15.4)
WBC: 6.6 10*3/uL (ref 3.4–10.8)

## 2015-09-20 ENCOUNTER — Ambulatory Visit (INDEPENDENT_AMBULATORY_CARE_PROVIDER_SITE_OTHER): Payer: Medicare Other | Admitting: Family Medicine

## 2015-09-20 ENCOUNTER — Encounter: Payer: Self-pay | Admitting: Family Medicine

## 2015-09-20 VITALS — BP 103/67 | HR 90 | Temp 98.0°F | Ht 67.0 in | Wt 119.0 lb

## 2015-09-20 DIAGNOSIS — K219 Gastro-esophageal reflux disease without esophagitis: Secondary | ICD-10-CM

## 2015-09-20 DIAGNOSIS — E785 Hyperlipidemia, unspecified: Secondary | ICD-10-CM | POA: Diagnosis not present

## 2015-09-20 DIAGNOSIS — E559 Vitamin D deficiency, unspecified: Secondary | ICD-10-CM | POA: Diagnosis not present

## 2015-09-20 DIAGNOSIS — Z23 Encounter for immunization: Secondary | ICD-10-CM

## 2015-09-20 DIAGNOSIS — N6019 Diffuse cystic mastopathy of unspecified breast: Secondary | ICD-10-CM

## 2015-09-20 MED ORDER — ALPRAZOLAM 0.5 MG PO TABS: 0.5000 mg | ORAL_TABLET | Freq: Three times a day (TID) | ORAL | Status: DC | PRN

## 2015-09-20 NOTE — Patient Instructions (Addendum)
Medicare Annual Wellness Visit  [facility] and the medical providers at [facility] strive to bring you the best medical care.  In doing so we not only want to address your current medical conditions and concerns but also to detect new conditions early and prevent illness, disease and health-related problems.    Medicare offers a yearly Wellness Visit which allows our clinical staff to assess your need for preventative services including immunizations, lifestyle education, counseling to decrease risk of preventable diseases and screening for fall risk and other medical concerns.    This visit is provided free of charge (no copay) for all Medicare recipients. The clinical pharmacists at [facility] have begun to conduct these Wellness Visits which will also include a thorough review of all your medications.    As you primary medical provider recommend that you make an appointment for your Annual Wellness Visit if you have not done so already this year.  You may set up this appointment before you leave today or you may call back ([phone]) and schedule an appointment.  Please make sure when you call that you mention that you are scheduling your Annual Wellness Visit with the clinical pharmacist so that the appointment may be made for the proper length of time.     Continue current medications. Continue good therapeutic lifestyle changes which include good diet and exercise. Fall precautions discussed with patient. If an FOBT was given today- please return it to our front desk. If you are over 55 years old - you may need Prevnar 13 or the adult Pneumonia vaccine.  **Flu shots are available--- please call and schedule a FLU-CLINIC appointment**  After your visit with [place] today you will receive a survey in the mail or online from [employer] regarding your care with [place]. Please take a moment to fill this out. Your feedback is very  important to [place] as you can help [place] better understand your patient needs as well as improve your experience and satisfaction. WE CARE ABOUT YOU!!!   The patient should try to drink one ensure daily She should return her FOBT, we will call her with the results of the laboratory results as soon as they become available This winter she should drink plenty of fluids and stay well hydrated She should continue with the pantoprazole and she should call the insurance company to find out if there is another medicine similar to the pantoprazole that would cost her less. She should continue to take her multivitamin

## 2015-09-20 NOTE — Progress Notes (Signed)
Subjective:    Patient ID: Misty [name], female    DOB: December 24, 1940, 74 y.o.   MRN: [id number]  HPI Pt here for follow up and management of chronic medical problems which includes hyperlipidemia. She is taking medications regularly. The patient does complain of fatigue and this is been complaints that she has discussed in the past. She has had multiple eye surgeries. She recently came to the office and was started on Protonix which has helped her reflux significantly with no good results with ranitidine. She is pleased with this. She otherwise denies chest pain shortness of breath trouble swallowing blood in the stool or black tarry bowel movements. She is passing her water well without complaints.     Patient Active Problem List   Diagnosis Date Noted  . Colitis   . Hematochezia 02/26/2015  . Ischemic colitis ([place]) 02/26/2015  . Orthostatic hypotension 02/26/2015  . Non-traumatic compression fracture of T11 thoracic vertebra 07/08/2014  . Hyperlipidemia 08/19/2013  . Fibrocystic breast disease 08/19/2013  . History of migraine headaches 08/19/2013  . GERD (gastroesophageal reflux disease) 04/07/2013  . Osteoporosis, postmenopausal 04/07/2013   Outpatient Encounter Prescriptions as of 09/20/2015  Medication Sig  . acetaminophen (TYLENOL) 500 MG tablet Take 500 mg by mouth every 6 (six) hours as needed for moderate pain or headache.   . ALPRAZolam (XANAX) 0.5 MG tablet Take 1 tablet (0.5 mg total) by mouth 3 (three) times daily as needed.  . cholecalciferol (VITAMIN D) 1000 UNITS tablet Take 2,000-4,000 Units by mouth daily. Take 2000 units daily except take 4000 units on Saturday and Sunday.  . loperamide (IMODIUM A-D) 2 MG tablet Take 2 mg by mouth as needed for diarrhea or loose stools.  . meclizine (ANTIVERT) 50 MG tablet Take 1 tablet (50 mg total) by mouth 3 (three) times daily as needed.  . Multiple Vitamins-Minerals (CENTRUM SILVER PO) Take 1 tablet by mouth daily.  .  pantoprazole (PROTONIX) 40 MG tablet Take 1 tablet (40 mg total) by mouth daily.  . ranitidine (ZANTAC) 150 MG tablet Take 150 mg by mouth 2 (two) times daily.   . vitamin B-12 (CYANOCOBALAMIN) 1000 MCG tablet Take 1,000 mcg by mouth daily.  . [DISCONTINUED] ALPRAZolam (XANAX) 0.5 MG tablet Take 1 tablet (0.5 mg total) by mouth 3 (three) times daily as needed.   No facility-administered encounter medications on file as of 09/20/2015.      Review of Systems  Constitutional: Positive for fatigue.  HENT: Negative.   Eyes: Negative.   Respiratory: Negative.   Cardiovascular: Negative.   Gastrointestinal: Negative.   Endocrine: Negative.   Genitourinary: Negative.   Musculoskeletal: Negative.   Skin: Negative.   Allergic/Immunologic: Negative.   Neurological: Negative.   Hematological: Negative.   Psychiatric/Behavioral: Negative.        Objective:   Physical Exam  Constitutional: She is oriented to person, place, and time. No distress.  Thin and somewhat older looking than her stated age but alert  HENT:  [name] Normocephalic and atraumatic.  Nose: Nose normal.  Mouth/Throat: Oropharynx is clear and moist. No oropharyngeal exudate.  Bilateral ears cerumen  Eyes: Conjunctivae and EOM are normal. Pupils are equal, round, and reactive to light. Right eye exhibits no discharge. Left eye exhibits no discharge. No scleral icterus.  Neck: Normal range of motion. Neck supple. No thyromegaly present.  Cardiovascular: Normal rate, regular rhythm, normal heart sounds and intact distal pulses.   No murmur heard. Rhythm is regular at 72/m  Pulmonary/Chest: Effort  normal and breath sounds normal. No respiratory distress. She has no wheezes. She has no rales. She exhibits no tenderness.  Abdominal: Soft. Bowel sounds are normal. She exhibits no mass. There is no tenderness. There is no rebound and no guarding.  Abdomen is nontender without bruits or organ enlargement or masses    Musculoskeletal: Normal range of motion. She exhibits tenderness. She exhibits no edema.  She has good leg raising bilaterally but remains sensitive and tender over her spine and her buttocks due to weight loss and recent surgery.  Lymphadenopathy:    She has no cervical adenopathy.  Neurological: She is alert and oriented to person, place, and time. She has normal reflexes. No cranial nerve deficit.  Skin: Skin is warm and dry. No rash noted.  Psychiatric: She has a normal mood and affect. Her behavior is normal. Judgment and thought content normal.  Increased anxiety and stress secondary to taking care of her husband and herself  Nursing note and vitals reviewed.  BP 103/67 mmHg  Pulse 90  Temp(Src) 98 F (36.7 C) (Oral)  Ht 5' 7"  (1.702 m)  Wt 119 lb (53.978 kg)  BMI 18.63 kg/m2        Assessment & Plan:  1. Hyperlipidemia -Continue aggressive therapeutic lifestyle changes pending results of lab work - Lipid panel  2. Gastroesophageal reflux disease, esophagitis presence not specified -Continue with proton ask and call insurance company and see if there is a cheaper alternative  3. Vitamin D deficiency -Continue with current vitamin D replacement pending results of lab work - Vit D  25 hydroxy (rtn osteoporosis monitoring)  4. Fibrocystic breast disease, unspecified laterality -The patient is having no complaints with her breast. She does have a history of fibrocystic breast disease and is due to to get her mammogram but prefers now to postpone this.  5. Encounter for immunization -She will get her flu shot today.  Meds ordered this encounter  Medications  . ALPRAZolam (XANAX) 0.5 MG tablet    Sig: Take 1 tablet (0.5 mg total) by mouth 3 (three) times daily as needed.    Dispense:  90 tablet    Refill:  2   Patient Instructions                       Medicare Annual Wellness Visit  [facility] and the medical providers at [facility] strive  to bring you the best medical care.  In doing so we not only want to address your current medical conditions and concerns but also to detect new conditions early and prevent illness, disease and health-related problems.    Medicare offers a yearly Wellness Visit which allows our clinical staff to assess your need for preventative services including immunizations, lifestyle education, counseling to decrease risk of preventable diseases and screening for fall risk and other medical concerns.    This visit is provided free of charge (no copay) for all Medicare recipients. The clinical pharmacists at [facility] have begun to conduct these Wellness Visits which will also include a thorough review of all your medications.    As you primary medical provider recommend that you make an appointment for your Annual Wellness Visit if you have not done so already this year.  You may set up this appointment before you leave today or you may call back ([phone]) and schedule an appointment.  Please make sure when you call that you mention that you are scheduling  your Annual Wellness Visit with the clinical pharmacist so that the appointment may be made for the proper length of time.     Continue current medications. Continue good therapeutic lifestyle changes which include good diet and exercise. Fall precautions discussed with patient. If an FOBT was given today- please return it to our front desk. If you are over 68 years old - you may need Prevnar 60 or the adult Pneumonia vaccine.  **Flu shots are available--- please call and schedule a FLU-CLINIC appointment**  After your visit with [place] today you will receive a survey in the mail or online from [employer] regarding your care with [place]. Please take a moment to fill this out. Your feedback is very important to [place] as you can help [place] better understand your patient needs as well as improve your experience and satisfaction. WE CARE ABOUT  YOU!!!   The patient should try to drink one ensure daily She should return her FOBT, we will call her with the results of the laboratory results as soon as they become available This winter she should drink plenty of fluids and stay well hydrated She should continue with the pantoprazole and she should call the insurance company to find out if there is another medicine similar to the pantoprazole that would cost her less. She should continue to take her multivitamin   Arrie Senate MD

## 2015-09-21 ENCOUNTER — Ambulatory Visit: Payer: Medicare Other | Admitting: Family Medicine

## 2015-09-21 LAB — LIPID PANEL
Chol/HDL Ratio: 3.2 ratio units (ref 0.0–4.4)
Cholesterol, Total: 155 mg/dL (ref 100–199)
HDL: 49 mg/dL (ref >39)
LDL Calculated: 89 mg/dL (ref 0–99)
Triglycerides: 83 mg/dL (ref 0–149)
VLDL Cholesterol Cal: 17 mg/dL (ref 5–40)

## 2015-09-21 LAB — VITAMIN D 25 HYDROXY (VIT D DEFICIENCY, FRACTURES): Vit D, 25-Hydroxy: 41.2 ng/mL (ref 30.0–100.0)

## 2015-11-21 DIAGNOSIS — Z1231 Encounter for screening mammogram for malignant neoplasm of breast: Secondary | ICD-10-CM | POA: Diagnosis not present

## 2015-11-21 LAB — HM MAMMOGRAPHY

## 2015-11-24 ENCOUNTER — Encounter: Payer: Self-pay | Admitting: *Deleted

## 2015-12-11 ENCOUNTER — Other Ambulatory Visit: Payer: Self-pay | Admitting: Family Medicine

## 2015-12-14 ENCOUNTER — Other Ambulatory Visit: Payer: Self-pay | Admitting: *Deleted

## 2015-12-14 MED ORDER — ALPRAZOLAM 0.5 MG PO TABS: 0.5000 mg | ORAL_TABLET | Freq: Three times a day (TID) | ORAL | Status: DC | PRN

## 2016-01-26 ENCOUNTER — Encounter: Payer: Self-pay | Admitting: Family Medicine

## 2016-01-26 ENCOUNTER — Ambulatory Visit (INDEPENDENT_AMBULATORY_CARE_PROVIDER_SITE_OTHER): Payer: Medicare Other | Admitting: Family Medicine

## 2016-01-26 VITALS — BP 101/59 | HR 91 | Temp 98.3°F | Ht 67.0 in | Wt 118.0 lb

## 2016-01-26 DIAGNOSIS — Z008 Encounter for other general examination: Secondary | ICD-10-CM

## 2016-01-26 DIAGNOSIS — Z1211 Encounter for screening for malignant neoplasm of colon: Secondary | ICD-10-CM | POA: Diagnosis not present

## 2016-01-26 DIAGNOSIS — R42 Dizziness and giddiness: Secondary | ICD-10-CM

## 2016-01-26 DIAGNOSIS — R5383 Other fatigue: Secondary | ICD-10-CM | POA: Diagnosis not present

## 2016-01-26 DIAGNOSIS — K219 Gastro-esophageal reflux disease without esophagitis: Secondary | ICD-10-CM

## 2016-01-26 DIAGNOSIS — R634 Abnormal weight loss: Secondary | ICD-10-CM

## 2016-01-26 DIAGNOSIS — M810 Age-related osteoporosis without current pathological fracture: Secondary | ICD-10-CM

## 2016-01-26 DIAGNOSIS — E559 Vitamin D deficiency, unspecified: Secondary | ICD-10-CM

## 2016-01-26 DIAGNOSIS — E785 Hyperlipidemia, unspecified: Secondary | ICD-10-CM

## 2016-01-26 MED ORDER — ALPRAZOLAM 0.5 MG PO TABS
0.5000 mg | ORAL_TABLET | Freq: Three times a day (TID) | ORAL | Status: DC | PRN
Start: 2016-01-26 — End: 2016-06-04

## 2016-01-26 MED ORDER — ALPRAZOLAM 0.5 MG PO TABS: 0.5000 mg | ORAL_TABLET | Freq: Three times a day (TID) | ORAL | Status: DC | PRN

## 2016-01-26 NOTE — Progress Notes (Signed)
Subjective:    Patient ID: Misty [name], female    DOB: 1941/11/07, 75 y.o.   MRN: [id number]  HPI Pt here for follow up and management of chronic medical problems which includes hyperlipidemia. She is taking medications regularly. The patient complains of some dizziness at times as well as weakness and fatigue. The patient is due to get a DEXA scan but would like to wait on this. She is also due for an FOBT and lab work and is requesting refills on several medicines. The patient complains of just feeling bad and weak and having no energy. She is especially weak in her legs. She did have back surgery recently. She does not take any medications that would lower her blood pressure. She has COPD. She does not smoke. She denies any chest pain but does have shortness of breath. She has recently been followed by the gastroenterologist and some of her irritable bowel syndrome/colitis symptoms have improved. Her water without problems. She is stressed extremely by her husband and now her son who live in the same household. She is always been a somewhat anxious and nervous person. We will get a chest x-ray today and make some attempts to try to help her nutritional status help her breathing status and make sure that there is nothing cardiac-wise it could be contributing to her weakness.     Patient Active Problem List   Diagnosis Date Noted  . Colitis   . Hematochezia 02/26/2015  . Ischemic colitis ([place]) 02/26/2015  . Orthostatic hypotension 02/26/2015  . Non-traumatic compression fracture of T11 thoracic vertebra 07/08/2014  . Hyperlipidemia 08/19/2013  . Fibrocystic breast disease 08/19/2013  . History of migraine headaches 08/19/2013  . GERD (gastroesophageal reflux disease) 04/07/2013  . Osteoporosis, postmenopausal 04/07/2013   Outpatient Encounter Prescriptions as of 01/26/2016  Medication Sig  . acetaminophen (TYLENOL) 500 MG tablet Take 500 mg by mouth every 6 (six) hours as needed for  moderate pain or headache.   . ALPRAZolam (XANAX) 0.5 MG tablet Take 1 tablet (0.5 mg total) by mouth 3 (three) times daily as needed.  . cholecalciferol (VITAMIN D) 1000 UNITS tablet Take 2,000-4,000 Units by mouth daily. Take 2000 units daily except take 4000 units on Saturday and Sunday.  . loperamide (IMODIUM A-D) 2 MG tablet Take 2 mg by mouth as needed for diarrhea or loose stools.  . meclizine (ANTIVERT) 50 MG tablet Take 1 tablet (50 mg total) by mouth 3 (three) times daily as needed.  . Multiple Vitamins-Minerals (CENTRUM SILVER PO) Take 1 tablet by mouth daily.  . pantoprazole (PROTONIX) 40 MG tablet Take 1 tablet (40 mg total) by mouth daily.  . ranitidine (ZANTAC) 150 MG tablet Take 150 mg by mouth 2 (two) times daily.   . vitamin B-12 (CYANOCOBALAMIN) 1000 MCG tablet Take 1,000 mcg by mouth daily.   No facility-administered encounter medications on file as of 01/26/2016.      Review of Systems  Constitutional: Positive for fatigue.  HENT: Negative.   Eyes: Negative.   Respiratory: Negative.   Cardiovascular: Negative.   Gastrointestinal: Negative.   Endocrine: Negative.   Genitourinary: Negative.   Musculoskeletal: Negative.   Skin: Negative.   Allergic/Immunologic: Negative.   Neurological: Positive for dizziness (at times) and weakness.  Hematological: Negative.   Psychiatric/Behavioral: Negative.        Objective:   Physical Exam  Constitutional: She is oriented to person, place, and time. No distress.  Thin and somewhat frail but alert white female  who is somewhat stressed as usual.  HENT:  [name] Normocephalic and atraumatic.  Right Ear: External ear normal.  Nose: Nose normal.  Mouth/Throat: Oropharynx is clear and moist.  Is cerumen left ear  Eyes: Conjunctivae and EOM are normal. Pupils are equal, round, and reactive to light. Right eye exhibits no discharge. Left eye exhibits no discharge. No scleral icterus.  Neck: Normal range of motion. Neck supple.  No thyromegaly present.  Cardiovascular: Normal rate, regular rhythm, normal heart sounds and intact distal pulses.   No murmur heard. The heart is regular at about 96/m  Pulmonary/Chest: Effort normal. No respiratory distress. She has wheezes. She has no rales. She exhibits no tenderness.  Sparse wheezes with rhonchi with coughing  Abdominal: Soft. Bowel sounds are normal. She exhibits no mass. There is no tenderness. There is no rebound and no guarding.  No abdominal tenderness liver or spleen enlargement  Musculoskeletal: Normal range of motion. She exhibits no edema or tenderness.  Lymphadenopathy:    She has no cervical adenopathy.  Neurological: She is alert and oriented to person, place, and time. She has normal reflexes. No cranial nerve deficit.  Skin: Skin is warm and dry. No rash noted.  Psychiatric: She has a normal mood and affect. Her behavior is normal. Judgment and thought content normal.  Somewhat depressed but alert  Nursing note and vitals reviewed.  BP 101/59 mmHg  Pulse 91  Temp(Src) 98.3 F (36.8 C) (Oral)  Ht '5\' 7"'$  (1.702 m)  Wt 118 lb (53.524 kg)  BMI 18.48 kg/m2        Assessment & Plan:  1. Hyperlipidemia -Continue with diet for low cholesterol - BMP8+EGFR - CBC with Differential/Platelet - Hepatic function panel - Lipid panel  2. Gastroesophageal reflux disease, esophagitis presence not specified -Continue with pantoprazole and when necessary Zantac - CBC with Differential/Platelet - Hepatic function panel  3. Vitamin D deficiency -Continue with vitamin D replacement pending results of lab work - CBC with Differential/Platelet - VITAMIN D 25 Hydroxy (Vit-D Deficiency, Fractures)  4. Special screening for malignant neoplasms, colon -Return the FOBT card - Fecal occult blood, imunochemical; Future - CBC with Differential/Platelet  5. Osteoporosis, postmenopausal -Continue to be careful not put herself at risk for falling  6. Loss of  weight - Ambulatory referral to Nutrition and Diabetic Education - Ambulatory referral to Cardiology - Ambulatory referral to Pulmonology  7. Nutritional assessment - Ambulatory referral to Nutrition and Diabetic Education  8. Other fatigue -Lab work today plus visits with pulmonology and cardiology and nutrition   Meds ordered this encounter  Medications  . DISCONTD: ALPRAZolam (XANAX) 0.5 MG tablet    Sig: Take 1 tablet (0.5 mg total) by mouth 3 (three) times daily as needed.    Dispense:  90 tablet    Refill:  2  . ALPRAZolam (XANAX) 0.5 MG tablet    Sig: Take 1 tablet (0.5 mg total) by mouth 3 (three) times daily as needed.    Dispense:  90 tablet    Refill:  2   Patient Instructions                       Medicare Annual Wellness Visit  [facility] and the medical providers at [facility] strive to bring you the best medical care.  In doing so we not only want to address your current medical conditions and concerns but also to detect new conditions early and prevent illness, disease and  health-related problems.    Medicare offers a yearly Wellness Visit which allows our clinical staff to assess your need for preventative services including immunizations, lifestyle education, counseling to decrease risk of preventable diseases and screening for fall risk and other medical concerns.    This visit is provided free of charge (no copay) for all Medicare recipients. The clinical pharmacists at [facility] have begun to conduct these Wellness Visits which will also include a thorough review of all your medications.    As you primary medical provider recommend that you make an appointment for your Annual Wellness Visit if you have not done so already this year.  You may set up this appointment before you leave today or you may call back ([phone]) and schedule an appointment.  Please make sure when you call that you mention that you are  scheduling your Annual Wellness Visit with the clinical pharmacist so that the appointment may be made for the proper length of time.     Continue current medications. Continue good therapeutic lifestyle changes which include good diet and exercise. Fall precautions discussed with patient. If an FOBT was given today- please return it to our front desk. If you are over 33 years old - you may need Prevnar 12 or the adult Pneumonia vaccine.  **Flu shots are available--- please call and schedule a FLU-CLINIC appointment**  After your visit with [place] today you will receive a survey in the mail or online from [employer] regarding your care with [place]. Please take a moment to fill this out. Your feedback is very important to [place] as you can help [place] better understand your patient needs as well as improve your experience and satisfaction. WE CARE ABOUT YOU!!!   We will ask the patient uses the inhaler to see if it helps her breathing and he We will call her with the results of the chest x-ray and the lab work as soon as these results become available We will schedule her for a visit with the nutritionist to see if she can gain some weight and this will help her feel better as she is tried boost and ensure and does not tolerate these well. We will schedule her also for visit with the pulmonologist to see if we can help improve her breathing and with the cardiologist just because of her general weakness that doesn't seem to be getting any better. All of the lab work should be back by the time of that visit and hopefully she will have seen the nutritionist by that time. She will take the antidepressant medicine as directed We would ask to see her back in about 4 weeks   Arrie Senate MD  - Ambulatory referral to Cardiology - Ambulatory referral to Pulmonology  9. Dizziness - Ambulatory referral to Cardiology - Ambulatory referral to Pulmonology  Arrie Senate MD

## 2016-01-26 NOTE — Patient Instructions (Addendum)
Medicare Annual Wellness Visit  [facility] and the medical providers at [facility] strive to bring you the best medical care.  In doing so we not only want to address your current medical conditions and concerns but also to detect new conditions early and prevent illness, disease and health-related problems.    Medicare offers a yearly Wellness Visit which allows our clinical staff to assess your need for preventative services including immunizations, lifestyle education, counseling to decrease risk of preventable diseases and screening for fall risk and other medical concerns.    This visit is provided free of charge (no copay) for all Medicare recipients. The clinical pharmacists at [facility] have begun to conduct these Wellness Visits which will also include a thorough review of all your medications.    As you primary medical provider recommend that you make an appointment for your Annual Wellness Visit if you have not done so already this year.  You may set up this appointment before you leave today or you may call back ([phone]) and schedule an appointment.  Please make sure when you call that you mention that you are scheduling your Annual Wellness Visit with the clinical pharmacist so that the appointment may be made for the proper length of time.     Continue current medications. Continue good therapeutic lifestyle changes which include good diet and exercise. Fall precautions discussed with patient. If an FOBT was given today- please return it to our front desk. If you are over 65 years old - you may need Prevnar 13 or the adult Pneumonia vaccine.  **Flu shots are available--- please call and schedule a FLU-CLINIC appointment**  After your visit with [place] today you will receive a survey in the mail or online from [employer] regarding your care with [place]. Please take a moment to fill this out. Your feedback is very  important to [place] as you can help [place] better understand your patient needs as well as improve your experience and satisfaction. WE CARE ABOUT YOU!!!   We will ask the patient uses the inhaler to see if it helps her breathing and he We will call her with the results of the chest x-ray and the lab work as soon as these results become available We will schedule her for a visit with the nutritionist to see if she can gain some weight and this will help her feel better as she is tried boost and ensure and does not tolerate these well. We will schedule her also for visit with the pulmonologist to see if we can help improve her breathing and with the cardiologist just because of her general weakness that doesn't seem to be getting any better. All of the lab work should be back by the time of that visit and hopefully she will have seen the nutritionist by that time. She will take the antidepressant medicine as directed We would ask to see her back in about 4 weeks

## 2016-01-27 ENCOUNTER — Observation Stay (HOSPITAL_COMMUNITY)
Admission: EM | Admit: 2016-01-27 | Discharge: 2016-01-29 | Disposition: A | Discharge status: Home / Self Care | Payer: Medicare Other | Attending: Internal Medicine | Admitting: Internal Medicine

## 2016-01-27 ENCOUNTER — Observation Stay (HOSPITAL_COMMUNITY): Payer: Medicare Other

## 2016-01-27 ENCOUNTER — Encounter (HOSPITAL_COMMUNITY): Payer: Self-pay | Admitting: Emergency Medicine

## 2016-01-27 ENCOUNTER — Emergency Department (HOSPITAL_COMMUNITY): Payer: Medicare Other

## 2016-01-27 DIAGNOSIS — K31819 Angiodysplasia of stomach and duodenum without bleeding: Secondary | ICD-10-CM | POA: Diagnosis not present

## 2016-01-27 DIAGNOSIS — G4700 Insomnia, unspecified: Secondary | ICD-10-CM | POA: Diagnosis not present

## 2016-01-27 DIAGNOSIS — J449 Chronic obstructive pulmonary disease, unspecified: Secondary | ICD-10-CM | POA: Diagnosis not present

## 2016-01-27 DIAGNOSIS — Z888 Allergy status to other drugs, medicaments and biological substances status: Secondary | ICD-10-CM | POA: Insufficient documentation

## 2016-01-27 DIAGNOSIS — F329 Major depressive disorder, single episode, unspecified: Secondary | ICD-10-CM | POA: Diagnosis not present

## 2016-01-27 DIAGNOSIS — F17200 Nicotine dependence, unspecified, uncomplicated: Secondary | ICD-10-CM | POA: Diagnosis present

## 2016-01-27 DIAGNOSIS — Z79899 Other long term (current) drug therapy: Secondary | ICD-10-CM | POA: Diagnosis not present

## 2016-01-27 DIAGNOSIS — Z886 Allergy status to analgesic agent status: Secondary | ICD-10-CM | POA: Diagnosis not present

## 2016-01-27 DIAGNOSIS — E785 Hyperlipidemia, unspecified: Secondary | ICD-10-CM | POA: Diagnosis not present

## 2016-01-27 DIAGNOSIS — Z881 Allergy status to other antibiotic agents status: Secondary | ICD-10-CM | POA: Diagnosis not present

## 2016-01-27 DIAGNOSIS — F419 Anxiety disorder, unspecified: Secondary | ICD-10-CM | POA: Diagnosis not present

## 2016-01-27 DIAGNOSIS — M810 Age-related osteoporosis without current pathological fracture: Secondary | ICD-10-CM | POA: Insufficient documentation

## 2016-01-27 DIAGNOSIS — K449 Diaphragmatic hernia without obstruction or gangrene: Secondary | ICD-10-CM | POA: Insufficient documentation

## 2016-01-27 DIAGNOSIS — R9389 Abnormal findings on diagnostic imaging of other specified body structures: Secondary | ICD-10-CM | POA: Diagnosis present

## 2016-01-27 DIAGNOSIS — R918 Other nonspecific abnormal finding of lung field: Secondary | ICD-10-CM | POA: Diagnosis not present

## 2016-01-27 DIAGNOSIS — E782 Mixed hyperlipidemia: Secondary | ICD-10-CM | POA: Diagnosis present

## 2016-01-27 DIAGNOSIS — M6281 Muscle weakness (generalized): Secondary | ICD-10-CM | POA: Diagnosis not present

## 2016-01-27 DIAGNOSIS — D6489 Other specified anemias: Secondary | ICD-10-CM | POA: Diagnosis not present

## 2016-01-27 DIAGNOSIS — D649 Anemia, unspecified: Secondary | ICD-10-CM | POA: Diagnosis not present

## 2016-01-27 DIAGNOSIS — K2100 Gastro-esophageal reflux disease with esophagitis, without bleeding: Secondary | ICD-10-CM

## 2016-01-27 DIAGNOSIS — K219 Gastro-esophageal reflux disease without esophagitis: Secondary | ICD-10-CM | POA: Diagnosis not present

## 2016-01-27 DIAGNOSIS — J189 Pneumonia, unspecified organism: Secondary | ICD-10-CM | POA: Diagnosis not present

## 2016-01-27 DIAGNOSIS — R938 Abnormal findings on diagnostic imaging of other specified body structures: Secondary | ICD-10-CM

## 2016-01-27 DIAGNOSIS — Z90710 Acquired absence of both cervix and uterus: Secondary | ICD-10-CM | POA: Diagnosis not present

## 2016-01-27 DIAGNOSIS — K3189 Other diseases of stomach and duodenum: Secondary | ICD-10-CM | POA: Insufficient documentation

## 2016-01-27 DIAGNOSIS — Z885 Allergy status to narcotic agent status: Secondary | ICD-10-CM | POA: Insufficient documentation

## 2016-01-27 DIAGNOSIS — E440 Moderate protein-calorie malnutrition: Secondary | ICD-10-CM | POA: Insufficient documentation

## 2016-01-27 DIAGNOSIS — K766 Portal hypertension: Secondary | ICD-10-CM | POA: Insufficient documentation

## 2016-01-27 DIAGNOSIS — R531 Weakness: Secondary | ICD-10-CM | POA: Diagnosis not present

## 2016-01-27 DIAGNOSIS — K2210 Ulcer of esophagus without bleeding: Secondary | ICD-10-CM | POA: Diagnosis not present

## 2016-01-27 LAB — CBC WITH DIFFERENTIAL/PLATELET
BASOS ABS: 0 10*3/uL (ref 0.0–0.2)
Basophils Absolute: 0 10*3/uL (ref 0.0–0.1)
Basophils Relative: 0 %
Basos: 0 %
EOS (ABSOLUTE): 0.2 10*3/uL (ref 0.0–0.4)
EOS PCT: 1 %
EOS: 2 %
Eosinophils Absolute: 0.1 10*3/uL (ref 0.0–0.7)
HEMATOCRIT: 27.1 % — AB (ref 34.0–46.6)
HEMATOCRIT: 24.8 % — AB (ref 36.0–46.0)
HEMOGLOBIN: 8.2 g/dL — AB (ref 11.1–15.9)
Hemoglobin: 7.5 g/dL — ABNORMAL LOW (ref 12.0–15.0)
IMMATURE GRANS (ABS): 0 10*3/uL (ref 0.0–0.1)
Immature Granulocytes: 0 %
LYMPHS ABS: 1.5 10*3/uL (ref 0.7–3.1)
LYMPHS ABS: 1 10*3/uL (ref 0.7–4.0)
LYMPHS PCT: 13 %
LYMPHS: 17 %
MCH: 24.3 pg — ABNORMAL LOW (ref 26.6–33.0)
MCH: 24.8 pg — AB (ref 26.0–34.0)
MCHC: 30.3 g/dL — AB (ref 31.5–35.7)
MCHC: 30.2 g/dL (ref 30.0–36.0)
MCV: 80 fL (ref 79–97)
MCV: 81.8 fL (ref 78.0–100.0)
MONOCYTES: 7 %
Monocytes Absolute: 0.6 10*3/uL (ref 0.1–0.9)
Monocytes Absolute: 0.7 10*3/uL (ref 0.1–1.0)
Monocytes Relative: 9 %
NEUTROS ABS: 6 10*3/uL (ref 1.7–7.7)
Neutrophils Absolute: 6.6 10*3/uL (ref 1.4–7.0)
Neutrophils Relative %: 77 %
Neutrophils: 74 %
PLATELETS: 230 10*3/uL (ref 150–400)
Platelets: 308 10*3/uL (ref 150–379)
RBC: 3.37 x10E6/uL — AB (ref 3.77–5.28)
RBC: 3.03 MIL/uL — AB (ref 3.87–5.11)
RDW: 15.6 % — ABNORMAL HIGH (ref 12.3–15.4)
RDW: 16 % — ABNORMAL HIGH (ref 11.5–15.5)
WBC: 8.9 10*3/uL (ref 3.4–10.8)
WBC: 7.8 10*3/uL (ref 4.0–10.5)

## 2016-01-27 LAB — PROTIME-INR
INR: 1.24 (ref 0.00–1.49)
PROTHROMBIN TIME: 15.8 s — AB (ref 11.6–15.2)

## 2016-01-27 LAB — URINALYSIS, ROUTINE W REFLEX MICROSCOPIC
Bilirubin Urine: NEGATIVE
GLUCOSE, UA: NEGATIVE mg/dL
Hgb urine dipstick: NEGATIVE
KETONES UR: NEGATIVE mg/dL
Nitrite: NEGATIVE
PH: 5.5 (ref 5.0–8.0)
Protein, ur: NEGATIVE mg/dL
Specific Gravity, Urine: 1.015 (ref 1.005–1.030)

## 2016-01-27 LAB — BMP8+EGFR
BUN / CREAT RATIO: 15 (ref 11–26)
BUN: 14 mg/dL (ref 8–27)
CO2: 23 mmol/L (ref 18–29)
Calcium: 9.7 mg/dL (ref 8.7–10.3)
Chloride: 100 mmol/L (ref 96–106)
Creatinine, Ser: 0.96 mg/dL (ref 0.57–1.00)
GFR calc Af Amer: 67 mL/min/{1.73_m2} (ref >59)
GFR calc non Af Amer: 58 mL/min/{1.73_m2} — ABNORMAL LOW (ref >59)
GLUCOSE: 119 mg/dL — AB (ref 65–99)
Potassium: 4.2 mmol/L (ref 3.5–5.2)
SODIUM: 139 mmol/L (ref 134–144)

## 2016-01-27 LAB — LIPID PANEL
CHOL/HDL RATIO: 2.5 ratio (ref 0.0–4.4)
Cholesterol, Total: 133 mg/dL (ref 100–199)
HDL: 54 mg/dL (ref >39)
LDL CALC: 61 mg/dL (ref 0–99)
TRIGLYCERIDES: 92 mg/dL (ref 0–149)
VLDL Cholesterol Cal: 18 mg/dL (ref 5–40)

## 2016-01-27 LAB — COMPREHENSIVE METABOLIC PANEL
ALK PHOS: 83 U/L (ref 38–126)
ALT: 12 U/L — AB (ref 14–54)
AST: 17 U/L (ref 15–41)
Albumin: 3.4 g/dL — ABNORMAL LOW (ref 3.5–5.0)
Anion gap: 8 (ref 5–15)
BILIRUBIN TOTAL: 0.5 mg/dL (ref 0.3–1.2)
BUN: 13 mg/dL (ref 6–20)
CALCIUM: 8.7 mg/dL — AB (ref 8.9–10.3)
CHLORIDE: 105 mmol/L (ref 101–111)
CO2: 28 mmol/L (ref 22–32)
CREATININE: 0.88 mg/dL (ref 0.44–1.00)
Glucose, Bld: 111 mg/dL — ABNORMAL HIGH (ref 65–99)
Potassium: 3.8 mmol/L (ref 3.5–5.1)
Sodium: 141 mmol/L (ref 135–145)
TOTAL PROTEIN: 6.3 g/dL — AB (ref 6.5–8.1)

## 2016-01-27 LAB — HEPATIC FUNCTION PANEL
ALK PHOS: 112 IU/L (ref 39–117)
ALT: 10 IU/L (ref 0–32)
AST: 16 IU/L (ref 0–40)
Albumin: 4.3 g/dL (ref 3.5–4.8)
BILIRUBIN, DIRECT: 0.09 mg/dL (ref 0.00–0.40)
Bilirubin Total: 0.3 mg/dL (ref 0.0–1.2)
TOTAL PROTEIN: 6.8 g/dL (ref 6.0–8.5)

## 2016-01-27 LAB — POC OCCULT BLOOD, ED: Fecal Occult Bld: NEGATIVE

## 2016-01-27 LAB — PREPARE RBC (CROSSMATCH)

## 2016-01-27 LAB — URINE MICROSCOPIC-ADD ON

## 2016-01-27 LAB — ABO/RH: ABO/RH(D): O POS

## 2016-01-27 LAB — VITAMIN D 25 HYDROXY (VIT D DEFICIENCY, FRACTURES): Vit D, 25-Hydroxy: 50.1 ng/mL (ref 30.0–100.0)

## 2016-01-27 LAB — APTT: APTT: 31 s (ref 24–37)

## 2016-01-27 LAB — BRAIN NATRIURETIC PEPTIDE: B Natriuretic Peptide: 90 pg/mL (ref 0.0–100.0)

## 2016-01-27 LAB — TROPONIN I

## 2016-01-27 MED ORDER — MECLIZINE HCL 12.5 MG PO TABS
50.0000 mg | ORAL_TABLET | Freq: Three times a day (TID) | ORAL | Status: DC | PRN
  Filled 2016-01-27: qty 2

## 2016-01-27 MED ORDER — ALPRAZOLAM 0.5 MG PO TABS
0.5000 mg | ORAL_TABLET | Freq: Three times a day (TID) | ORAL | Status: DC | PRN
Start: 2016-01-27 — End: 2016-01-29
  Administered 2016-01-27 – 2016-01-28 (×2): 0.5 mg via ORAL
  Filled 2016-01-27 (×2): qty 1

## 2016-01-27 MED ORDER — ACETAMINOPHEN 325 MG PO TABS
650.0000 mg | ORAL_TABLET | Freq: Four times a day (QID) | ORAL | Status: DC | PRN
  Filled 2016-01-27: qty 2

## 2016-01-27 MED ORDER — VITAMIN B-12 1000 MCG PO TABS
1000.0000 ug | ORAL_TABLET | Freq: Every day | ORAL | Status: DC
  Administered 2016-01-27 – 2016-01-29 (×3): 1000 ug via ORAL
  Filled 2016-01-27 (×3): qty 1

## 2016-01-27 MED ORDER — PANTOPRAZOLE SODIUM 40 MG PO TBEC
40.0000 mg | DELAYED_RELEASE_TABLET | Freq: Every day | ORAL | Status: DC
  Administered 2016-01-27 – 2016-01-28 (×2): 40 mg via ORAL
  Filled 2016-01-27 (×2): qty 1

## 2016-01-27 MED ORDER — BOOST / RESOURCE BREEZE PO LIQD
1.0000 | Freq: Three times a day (TID) | ORAL | Status: DC
  Administered 2016-01-27 – 2016-01-29 (×4): 1 via ORAL

## 2016-01-27 MED ORDER — ACETAMINOPHEN 650 MG RE SUPP: 650.0000 mg | Freq: Four times a day (QID) | RECTAL | Status: DC | PRN

## 2016-01-27 MED ORDER — FAMOTIDINE 20 MG PO TABS
20.0000 mg | ORAL_TABLET | Freq: Two times a day (BID) | ORAL | Status: DC
  Administered 2016-01-27 – 2016-01-28 (×2): 20 mg via ORAL
  Filled 2016-01-27 (×2): qty 1

## 2016-01-27 MED ORDER — SODIUM CHLORIDE 0.9% FLUSH: 3.0000 mL | INTRAVENOUS | Status: DC | PRN

## 2016-01-27 MED ORDER — SODIUM CHLORIDE 0.9% FLUSH
3.0000 mL | Freq: Two times a day (BID) | INTRAVENOUS | Status: DC
  Administered 2016-01-27 – 2016-01-29 (×4): 3 mL via INTRAVENOUS

## 2016-01-27 MED ORDER — SODIUM CHLORIDE 0.9 % IV BOLUS (SEPSIS)
500.0000 mL | Freq: Once | INTRAVENOUS | Status: AC
  Administered 2016-01-27: 500 mL via INTRAVENOUS

## 2016-01-27 MED ORDER — SODIUM CHLORIDE 0.9 % IV SOLN: 250.0000 mL | INTRAVENOUS | Status: DC | PRN

## 2016-01-27 MED ORDER — IOHEXOL 300 MG/ML  SOLN
75.0000 mL | Freq: Once | INTRAMUSCULAR | Status: AC | PRN
  Administered 2016-01-27: 75 mL via INTRAVENOUS

## 2016-01-27 MED ORDER — SODIUM CHLORIDE 0.9 % IV SOLN: 10.0000 mL/h | Freq: Once | INTRAVENOUS | Status: AC

## 2016-01-27 MED ORDER — PROMETHAZINE HCL 25 MG/ML IJ SOLN
6.2500 mg | Freq: Once | INTRAMUSCULAR | Status: AC
  Administered 2016-01-27: 6.25 mg via INTRAVENOUS
  Filled 2016-01-27: qty 1

## 2016-01-27 MED ORDER — ALUM & MAG HYDROXIDE-SIMETH 200-200-20 MG/5ML PO SUSP
30.0000 mL | Freq: Once | ORAL | Status: AC
  Administered 2016-01-27: 30 mL via ORAL
  Filled 2016-01-27: qty 30

## 2016-01-27 MED ORDER — ONDANSETRON HCL 4 MG/2ML IJ SOLN
4.0000 mg | Freq: Four times a day (QID) | INTRAMUSCULAR | Status: DC | PRN
  Administered 2016-01-27: 4 mg via INTRAVENOUS
  Filled 2016-01-27: qty 2

## 2016-01-27 MED ORDER — ONDANSETRON HCL 4 MG PO TABS: 4.0000 mg | ORAL_TABLET | Freq: Four times a day (QID) | ORAL | Status: DC | PRN

## 2016-01-27 NOTE — ED Provider Notes (Signed)
CSN: [id number]     Arrival date & time 01/27/16  1129 History  By signing my name below, I, Ronney Lion, attest that this documentation has been prepared under the direction and in the presence of Loren Racer, MD. Electronically Signed: Ronney Lion, ED Scribe. 01/27/2016. 11:57 AM.   Chief Complaint  Patient presents with  . Abnormal Lab   The history is provided by the patient. No language interpreter was used.  HPI Comments: Misty [name] is a 75 y.o. female with a history of HLD, hemorrhoids, diverticulosis, depression, anxiety, and COPD, who presents to the Emergency Department complaining of a low Hgb count of 8.2 that she was notified of today. Patient states she had gone to her PCP yesterday for blood labs and was told her Hgb count had gone down from 12.2 at her last visit, to 8.2. She complains of associated light-headed, dizziness exacerbated by walking, generalized weakness, and mild SOB for the past 2 months. She reports she had a colonoscopy last year, which her GI doctor recommended because she had constipation. She states she has lost weight over the past 1-2 years as she has had loss of appetite since her back surgeries 1-2 years ago. She denies frequent NSAID use. She denies hematochezia or melena.   Past Medical History  Diagnosis Date  . Migraines   . Osteoporosis   . Hyperlipidemia   . PONV (postoperative nausea and vomiting)   . History of bronchitis   . History of migraine     many yrs ago  . Numbness     left leg  . Chronic back pain   . Arthritis     back  . GERD (gastroesophageal reflux disease)     takes Ranidine daily  . Constipation     OTC stool softener prn  . Hemorrhoids   . Diverticulosis   . Urinary frequency   . Urinary urgency   . Nocturia   . Depression   . Anxiety     takes Xanax daily  . Insomnia   . History of shingles   . Cataract   . COPD (chronic obstructive pulmonary disease) [facility]    Past Surgical History  Procedure  Laterality Date  . Abdominal hysterectomy    . Esophagogastroduodenoscopy    . Colonoscopy    . Lumbar laminectomy/decompression microdiscectomy Bilateral 05/20/2013    Procedure: LUMBAR LAMINECTOMY/DECOMPRESSION MICRODISCECTOMY 1 LEVEL;  Surgeon: Hewitt Shorts, MD;  Location: [facility]UR[facility];  Service: Neurosurgery;  Laterality: Bilateral;  Bilateral Lumbar four-five laminotomy and left lumbar four-five microdiskectomy  . Eye surgery Right 3/15    cataracts  . Kyphoplasty N/A 07/08/2014    Procedure: Thoracic Eleven Kyphoplasty;  Surgeon: Hewitt Shorts, MD;  Location: [facility] NEURO ORS;  Service: Neurosurgery;  Laterality: N/A;  . Colonoscopy N/A 02/17/2015    Procedure: COLONOSCOPY;  Surgeon: Malissa Hippo, MD;  Location: [facility];  Service: Endoscopy;  Laterality: N/A;  110n - moved to 11:15 - Ann to notify pt   Family History  Problem Relation Age of Onset  . Hypertension Mother   . Colon cancer Neg Hx    Social History  Substance Use Topics  . Smoking status: Current Some Day Smoker -- 1.00 packs/day for 52 years    Types: Cigarettes    Start date: 11/18/1958    Last Attempt to Quit: 08/20/2011  . Smokeless tobacco: Never Used     Comment: 1/2-1pack off and on all her life  . Alcohol  Use: No   OB History    No data available     Review of Systems  Constitutional: Positive for activity change, appetite change (chronic, baseline for past 1-2 years, per pt) and fatigue. Negative for fever and chills.  Respiratory: Positive for shortness of breath. Negative for cough.   Cardiovascular: Negative for chest pain and leg swelling.  Gastrointestinal: Negative for nausea, vomiting, abdominal pain, diarrhea and blood in stool.  Musculoskeletal: Negative for myalgias, back pain, neck pain and neck stiffness.  Skin: Negative for pallor, rash and wound.  Neurological: Positive for dizziness, weakness and light-headedness. Negative for syncope, numbness and headaches. Tremors:  generalized.  All other systems reviewed and are negative.     Allergies  Codeine; Asa; Azithromycin; Celebrex; Cymbalta; Prednisone; Vioxx; Zelnorm; and Zocor  Home Medications   Prior to Admission medications   Medication Sig Start Date End Date Taking? Authorizing Provider  acetaminophen (TYLENOL) 500 MG tablet Take 500 mg by mouth every 6 (six) hours as needed for moderate pain or headache.    Yes Historical Provider, MD  ALPRAZolam Prudy Feeler) 0.5 MG tablet Take 1 tablet (0.5 mg total) by mouth 3 (three) times daily as needed. Patient taking differently: Take 0.5 mg by mouth 3 (three) times daily as needed for anxiety.  01/26/16  Yes Ernestina Penna, MD  cholecalciferol (VITAMIN D) 1000 UNITS tablet Take 2,000-4,000 Units by mouth daily. Take 2000 units daily except take 4000 units on Saturday and Sunday.   Yes Historical Provider, MD  meclizine (ANTIVERT) 50 MG tablet Take 1 tablet (50 mg total) by mouth 3 (three) times daily as needed. Patient taking differently: Take 50 mg by mouth 3 (three) times daily as needed for dizziness.  09/05/15  Yes Junie Spencer, FNP  Multiple Vitamins-Minerals (CENTRUM SILVER PO) Take 1 tablet by mouth daily.   Yes Historical Provider, MD  pantoprazole (PROTONIX) 40 MG tablet Take 1 tablet (40 mg total) by mouth daily. Patient taking differently: Take 40 mg by mouth daily as needed (heartburn).  09/05/15  Yes Junie Spencer, FNP  ranitidine (ZANTAC) 150 MG tablet Take 150 mg by mouth 2 (two) times daily.    Yes Historical Provider, MD  vitamin B-12 (CYANOCOBALAMIN) 1000 MCG tablet Take 1,000 mcg by mouth daily.   Yes Historical Provider, MD   BP 99/46 mmHg  Pulse 77  Temp(Src) 98.1 F (36.7 C) (Oral)  Resp 19  Ht 5\' 7"  (1.702 m)  Wt 118 lb (53.524 kg)  BMI 18.48 kg/m2  SpO2 97% Physical Exam  Constitutional: She is oriented to person, place, and time. She appears well-developed and well-nourished. No distress.  HENT:  Head: Normocephalic and  atraumatic.  Mouth/Throat: Oropharynx is clear and moist.  Pale mucosa  Eyes: Conjunctivae and EOM are normal. Pupils are equal, round, and reactive to light.  Neck: Normal range of motion. Neck supple. No tracheal deviation present.  Cardiovascular: Normal rate and regular rhythm.   Pulmonary/Chest: Effort normal and breath sounds normal. No respiratory distress. She has no wheezes. She has no rales. She exhibits no tenderness.  Abdominal: Soft. Bowel sounds are normal. She exhibits no distension and no mass. There is no tenderness. There is no rebound and no guarding.  Musculoskeletal: Normal range of motion. She exhibits no edema or tenderness.  Neurological: She is alert and oriented to person, place, and time.  5/5 motor in all extremities. Sensation is fully intact.  Skin: Skin is warm and dry. No rash noted. No  erythema.  Psychiatric: Her behavior is normal.  Flat affect  Nursing note and vitals reviewed.   ED Course  Procedures (including critical care time)  DIAGNOSTIC STUDIES: Oxygen Saturation is 100% on RA, normal by my interpretation.    COORDINATION OF CARE: 11:57 AM - Discussed treatment plan with pt at bedside which includes diagnostic testing. Pt verbalized understanding and agreed to plan.   Labs Review Labs Reviewed  CBC WITH DIFFERENTIAL/PLATELET - Abnormal; Notable for the following:    RBC 3.03 (*)    Hemoglobin 7.5 (*)    HCT 24.8 (*)    MCH 24.8 (*)    RDW 16.0 (*)    All other components within normal limits  COMPREHENSIVE METABOLIC PANEL - Abnormal; Notable for the following:    Glucose, Bld 111 (*)    Calcium 8.7 (*)    Total Protein 6.3 (*)    Albumin 3.4 (*)    ALT 12 (*)    All other components within normal limits  PROTIME-INR - Abnormal; Notable for the following:    Prothrombin Time 15.8 (*)    All other components within normal limits  BRAIN NATRIURETIC PEPTIDE  TROPONIN I  APTT  URINALYSIS, ROUTINE W REFLEX MICROSCOPIC (NOT AT [facility])   OCCULT BLOOD X 1 CARD TO LAB, STOOL  POC OCCULT BLOOD, ED  TYPE AND SCREEN  PREPARE RBC (CROSSMATCH)  ABO/RH    Imaging Review Dg Chest 2 View  01/27/2016  CLINICAL DATA:  75 year old female with a history of weakness for 2 months. Smoker. EXAM: CHEST - 2 VIEW COMPARISON:  Chest x-ray 08/15/2014, 04/19/2014, abdominal CT 02/26/2015 FINDINGS: Cardiomediastinal silhouette unchanged in size and contour. Atherosclerotic calcifications of the aortic arch. Tortuosity of descending thoracic aorta. Stigmata of emphysema, with increased retrosternal airspace, flattened hemidiaphragms, increased AP diameter, and hyperinflation on the AP view. No confluent airspace disease. Coarsening of interstitial markings, more prominent than the comparison. Nodular opacity in the right mid lung has developed since the comparison. Opacities at the right mid base. No pleural effusion.  No pneumothorax. Changes of vertebral augmentation again evident.  No new fracture. Unremarkable appearance of the upper abdomen. IMPRESSION: Nodular opacity in the right mid lung and medial base, concerning for infection given the history. Followup PA and lateral chest X-ray is recommended in 3-4 weeks following trial of antibiotic therapy to ensure resolution and exclude underlying malignancy. Emphysema. Atherosclerosis. Signed, Yvone Neu. Loreta Ave, DO [facility] Electronically Signed   By: Gilmer Mor D.O.   On: 01/27/2016 13:10   I have personally reviewed and evaluated these images and lab results as part of my medical decision-making.   EKG Interpretation   Date/Time:  Saturday January 27 2016 11:42:35 EST Ventricular Rate:  84 PR Interval:  131 QRS Duration: 96 QT Interval:  387 QTC Calculation: 457 R Axis:   22 Text Interpretation:  Sinus rhythm Probable anteroseptal infarct, old  Nonspecific T abnormalities, lateral leads Confirmed by Ranae Palms  MD,  Osualdo Hansell ([id number]) on  01/27/2016 1:30:45 PM      MDM   Final diagnoses:  Generalized weakness  Symptomatic anemia    I personally performed the services described in this documentation, which was scribed in my presence. The recorded information has been reviewed and is accurate.    Patient with hemoglobin of 7.5. Guaiac negative. Type and cross and start blood transfusion. Patient did have a circular opacity in the lungs on chest x-ray. She has no infective signs or symptoms.  We'll likely need a CT scan given her chronic history of smoking. Discussed with Dr. Kerry Hough. Admit to MedSurg bed.   Loren Racer, MD 01/27/16 1331

## 2016-01-27 NOTE — ED Notes (Signed)
MD at bedside. 

## 2016-01-27 NOTE — H&P (Signed)
[facility] History and Physical  CARNELIA OSCAR GNO:[id number] DOB: 1941/03/11 DOA: 01/27/2016  Referring physician: Kathie Dike PCP: Redge Gainer, MD   Chief Complaint: Weakness and low Hgb  HPI: Misty [name] is a 75 y.o. female with a hx of HLD, diverticulitis, COPD, hemorroids, anxiety, and depression had gone to her pcp office yesterday with complaints of generalized weakness, dizziness on standing, shortness of breath on exertion and general fatigue. She had been experiencing these symptoms for several months now. Routine labs were checked and she was found to have a hemoglobin of 8.2. Her baseline hemoglobin is around 12. She was advised to present to the ED were hemoglobin was confirmed to be low at 7.5. Patient denies any history of hematochezia or melena. Her stool occult blood was negative. She reports having a colonoscopy in April of 2016. She does not take NSAIDS, but does endorse frequent reflux symptoms. She does not admit to any other sources of bleeding. She reports that she has been losing weight unintentionally. Her appetite has been poor. Due to her symptomatic anemia, she has been referred for admission.   Review of Systems:  Pertinent positives as per HPI, otherwise negativr  Past Medical History  Diagnosis Date  . Migraines   . Osteoporosis   . Hyperlipidemia   . PONV (postoperative nausea and vomiting)   . History of bronchitis   . History of migraine     many yrs ago  . Numbness     left leg  . Chronic back pain   . Arthritis     back  . GERD (gastroesophageal reflux disease)     takes Ranidine daily  . Constipation     OTC stool softener prn  . Hemorrhoids   . Diverticulosis   . Urinary frequency   . Urinary urgency   . Nocturia   . Depression   . Anxiety     takes Xanax daily  . Insomnia   . History of shingles   . Cataract   . COPD (chronic obstructive pulmonary disease) [facility])    Past Surgical History  Procedure Laterality  Date  . Abdominal hysterectomy    . Esophagogastroduodenoscopy    . Colonoscopy    . Lumbar laminectomy/decompression microdiscectomy Bilateral 05/20/2013    Procedure: LUMBAR LAMINECTOMY/DECOMPRESSION MICRODISCECTOMY 1 LEVEL;  Surgeon: Hosie Spangle, MD;  Location: [place] NEURO ORS;  Service: Neurosurgery;  Laterality: Bilateral;  Bilateral Lumbar four-five laminotomy and left lumbar four-five microdiskectomy  . Eye surgery Right 3/15    cataracts  . Kyphoplasty N/A 07/08/2014    Procedure: Thoracic Eleven Kyphoplasty;  Surgeon: Hosie Spangle, MD;  Location: [place] NEURO ORS;  Service: Neurosurgery;  Laterality: N/A;  . Colonoscopy N/A 02/17/2015    Procedure: COLONOSCOPY;  Surgeon: Rogene Houston, MD;  Location: [facility];  Service: Endoscopy;  Laterality: N/A;  110n - moved to 11:15 - Ann to notify pt   Social History:  reports that she has been smoking Cigarettes.  She started smoking about 57 years ago. She has a 52 pack-year smoking history. She has never used smokeless tobacco. She reports that she does not drink alcohol or use illicit drugs.  Allergies  Allergen Reactions  . Codeine Nausea And Vomiting  . Asa [Aspirin] Other (See Comments)    Patient is unaware of allergy  . Azithromycin Other (See Comments)    Patient is unaware of allergy  . Celebrex [Celecoxib] Other (See Comments)    Irritated stomach   .  Cymbalta [Duloxetine Hcl] Other (See Comments)    Felt groggy  . Prednisone Rash    She has seen the podiatrist and he had injected cortisone in her feet and she had also taken a peel at home. She had a severe reaction to her face with a rash and had to take Benadryl to resolve the symptom. She actually did get more cortisone shots, but no additional reactions to the shots.  . Vioxx [Rofecoxib] Other (See Comments)    Unknown  . Zelnorm [Tegaserod] Other (See Comments)    Patient is unaware of allergy  . Zocor [Simvastatin] Other (See Comments)    Patient is unaware of  allergy    Family History  Problem Relation Age of Onset  . Hypertension Mother   . Colon cancer Neg Hx     Prior to Admission medications   Medication Sig Start Date End Date Taking? Authorizing Provider  acetaminophen (TYLENOL) 500 MG tablet Take 500 mg by mouth every 6 (six) hours as needed for moderate pain or headache.    Yes Historical Provider, MD  ALPRAZolam Duanne Moron) 0.5 MG tablet Take 1 tablet (0.5 mg total) by mouth 3 (three) times daily as needed. Patient taking differently: Take 0.5 mg by mouth 3 (three) times daily as needed for anxiety.  01/26/16  Yes Chipper Herb, MD  cholecalciferol (VITAMIN D) 1000 UNITS tablet Take 2,000-4,000 Units by mouth daily. Take 2000 units daily except take 4000 units on Saturday and Sunday.   Yes Historical Provider, MD  meclizine (ANTIVERT) 50 MG tablet Take 1 tablet (50 mg total) by mouth 3 (three) times daily as needed. Patient taking differently: Take 50 mg by mouth 3 (three) times daily as needed for dizziness.  09/05/15  Yes Sharion Balloon, FNP  Multiple Vitamins-Minerals (CENTRUM SILVER PO) Take 1 tablet by mouth daily.   Yes Historical Provider, MD  pantoprazole (PROTONIX) 40 MG tablet Take 1 tablet (40 mg total) by mouth daily. Patient taking differently: Take 40 mg by mouth daily as needed (heartburn).  09/05/15  Yes Sharion Balloon, FNP  ranitidine (ZANTAC) 150 MG tablet Take 150 mg by mouth 2 (two) times daily.    Yes Historical Provider, MD  vitamin B-12 (CYANOCOBALAMIN) 1000 MCG tablet Take 1,000 mcg by mouth daily.   Yes Historical Provider, MD   Physical Exam: Filed Vitals:   01/27/16 1433 01/27/16 1504 01/27/16 1735 01/27/16 1800  BP: 103/42 108/75 116/45 106/48  Pulse: 85 87 90 88  Temp: 97.4 F (36.3 C) 97.6 F (36.4 C) 98.6 F (37 C) 98.6 F (37 C)  TempSrc: Oral Oral Oral Oral  Resp: 18 20 20 20   Height: 5' 7"  (1.702 m)     Weight: 53.524 kg (118 lb)     SpO2: 95% 100% 98% 99%    Wt Readings from Last 3  Encounters:  01/27/16 53.524 kg (118 lb)  01/26/16 53.524 kg (118 lb)  09/20/15 53.978 kg (119 lb)    General:  Appears calm and comfortable Eyes: PERRL, normal lids, irises & conjunctiva ENT: grossly normal hearing, lips & tongue Neck: no LAD, masses or thyromegaly Cardiovascular: RRR, no m/r/g. No LE edema. Telemetry: SR, no arrhythmias  Respiratory: CTA bilaterally, no w/r/r. Normal respiratory effort. Abdomen: soft, ntnd Skin: no rash or induration seen on limited exam Musculoskeletal: grossly normal tone BUE/BLE Psychiatric: grossly normal mood and affect, speech fluent and appropriate Neurologic: grossly non-focal.          Labs on Admission:  Basic Metabolic Panel:  Recent Labs Lab 01/26/16 1133 01/27/16 1221  NA 139 141  K 4.2 3.8  CL 100 105  CO2 23 28  GLUCOSE 119* 111*  BUN 14 13  CREATININE 0.96 0.88  CALCIUM 9.7 8.7*   Liver Function Tests:  Recent Labs Lab 01/26/16 1133 01/27/16 1221  AST 16 17  ALT 10 12*  ALKPHOS 112 83  BILITOT 0.3 0.5  PROT 6.8 6.3*  ALBUMIN 4.3 3.4*   No results for input(s): LIPASE, AMYLASE in the last 168 hours. No results for input(s): AMMONIA in the last 168 hours. CBC:  Recent Labs Lab 01/26/16 1133 01/27/16 1221  WBC 8.9 7.8  NEUTROABS 6.6 6.0  HGB  --  7.5*  HCT 27.1* 24.8*  MCV 80 81.8  PLT 308 230   Cardiac Enzymes:  Recent Labs Lab 01/27/16 1221  TROPONINI <0.03    BNP (last 3 results)  Recent Labs  01/27/16 1221  BNP 90.0    ProBNP (last 3 results) No results for input(s): PROBNP in the last 8760 hours.  CBG: No results for input(s): GLUCAP in the last 168 hours.  Radiological Exams on Admission: Dg Chest 2 View  01/27/2016  CLINICAL DATA:  75 year old female with a history of weakness for 2 months. Smoker. EXAM: CHEST - 2 VIEW COMPARISON:  Chest x-ray 08/15/2014, 04/19/2014, abdominal CT 02/26/2015 FINDINGS: Cardiomediastinal silhouette unchanged in size and contour.  Atherosclerotic calcifications of the aortic arch. Tortuosity of descending thoracic aorta. Stigmata of emphysema, with increased retrosternal airspace, flattened hemidiaphragms, increased AP diameter, and hyperinflation on the AP view. No confluent airspace disease. Coarsening of interstitial markings, more prominent than the comparison. Nodular opacity in the right mid lung has developed since the comparison. Opacities at the right mid base. No pleural effusion.  No pneumothorax. Changes of vertebral augmentation again evident.  No new fracture. Unremarkable appearance of the upper abdomen. IMPRESSION: Nodular opacity in the right mid lung and medial base, concerning for infection given the history. Followup PA and lateral chest X-ray is recommended in 3-4 weeks following trial of antibiotic therapy to ensure resolution and exclude underlying malignancy. Emphysema. Atherosclerosis. Signed, Dulcy Fanny. Earleen Newport, DO [facility] Electronically Signed   By: Corrie Mckusick D.O.   On: 01/27/2016 13:10    EKG: Independently reviewed. No acute changes  Assessment/Plan Principal Problem:   Symptomatic anemia Active Problems:   GERD (gastroesophageal reflux disease)   Hyperlipidemia   Generalized weakness   Abnormal chest x-ray   Tobacco use disorder   1. Symptomatic anemia. Patient's hemoglobin is noted to be low at 7.8 and she is having significant symptoms. 2 units PRBCs have been ordered per ED .Will check anemia panel. Check TSH. Will consult gastroenterology to see if colonoscopy/EGD is necessary. 2. Abnormal chest x-ray. Chest x-ray indicates possible pneumonia, although she does not have any fever, cough, leukocytosis. Since she is a smoker and has been losing weight, will check CT scan of the chest to rule out underlying pathology. 3. Generalized weakness . related to #1. She'll likely improve after transfusion. 4. GERD. Continue PPI.   5. Tobacco use disorder. Counseled on the importance of tobacco cessation.    Code Status: DNR DVT Prophylaxis: scd Family Communication: discussed with patient Disposition Plan: discharge home once improved  Time spent: 54 minutes  Kathie Dike, MD. [facility] Pager [phone]

## 2016-01-27 NOTE — ED Notes (Signed)
Patient states she went to her PCP yesterday and blood work done and they called today stating her HGB was 8.2. Patient complains of weakness x 2 months. Denies known active bleeding.

## 2016-01-28 DIAGNOSIS — J690 Pneumonitis due to inhalation of food and vomit: Secondary | ICD-10-CM

## 2016-01-28 DIAGNOSIS — K210 Gastro-esophageal reflux disease with esophagitis: Secondary | ICD-10-CM | POA: Diagnosis not present

## 2016-01-28 DIAGNOSIS — D649 Anemia, unspecified: Secondary | ICD-10-CM | POA: Diagnosis not present

## 2016-01-28 DIAGNOSIS — R112 Nausea with vomiting, unspecified: Secondary | ICD-10-CM | POA: Diagnosis not present

## 2016-01-28 DIAGNOSIS — R634 Abnormal weight loss: Secondary | ICD-10-CM | POA: Diagnosis not present

## 2016-01-28 DIAGNOSIS — R531 Weakness: Secondary | ICD-10-CM | POA: Diagnosis not present

## 2016-01-28 DIAGNOSIS — E785 Hyperlipidemia, unspecified: Secondary | ICD-10-CM

## 2016-01-28 DIAGNOSIS — K219 Gastro-esophageal reflux disease without esophagitis: Secondary | ICD-10-CM | POA: Diagnosis not present

## 2016-01-28 LAB — TYPE AND SCREEN
ABO/RH(D): O POS
Antibody Screen: NEGATIVE
UNIT DIVISION: 0
Unit division: 0

## 2016-01-28 LAB — BASIC METABOLIC PANEL
Anion gap: 6 (ref 5–15)
BUN: 17 mg/dL (ref 6–20)
CALCIUM: 8.3 mg/dL — AB (ref 8.9–10.3)
CO2: 28 mmol/L (ref 22–32)
CREATININE: 0.83 mg/dL (ref 0.44–1.00)
Chloride: 106 mmol/L (ref 101–111)
GFR calc Af Amer: >60 mL/min (ref >60)
GLUCOSE: 106 mg/dL — AB (ref 65–99)
POTASSIUM: 4.1 mmol/L (ref 3.5–5.1)
Sodium: 140 mmol/L (ref 135–145)

## 2016-01-28 LAB — CBC
HCT: 31.5 % — ABNORMAL LOW (ref 36.0–46.0)
Hemoglobin: 9.9 g/dL — ABNORMAL LOW (ref 12.0–15.0)
MCH: 25.9 pg — AB (ref 26.0–34.0)
MCHC: 31.4 g/dL (ref 30.0–36.0)
MCV: 82.5 fL (ref 78.0–100.0)
PLATELETS: 232 10*3/uL (ref 150–400)
RBC: 3.82 MIL/uL — ABNORMAL LOW (ref 3.87–5.11)
RDW: 15.3 % (ref 11.5–15.5)
WBC: 7.8 10*3/uL (ref 4.0–10.5)

## 2016-01-28 LAB — RETICULOCYTES
RBC.: 3.77 MIL/uL — AB (ref 3.87–5.11)
RETIC CT PCT: 1.5 % (ref 0.4–3.1)
Retic Count, Absolute: 56.6 10*3/uL (ref 19.0–186.0)

## 2016-01-28 LAB — IRON AND TIBC
Iron: 84 ug/dL (ref 28–170)
SATURATION RATIOS: 25 % (ref 10.4–31.8)
TIBC: 340 ug/dL (ref 250–450)
UIBC: 256 ug/dL

## 2016-01-28 LAB — TSH: TSH: 1.34 u[IU]/mL (ref 0.350–4.500)

## 2016-01-28 LAB — FERRITIN: FERRITIN: 11 ng/mL (ref 11–307)

## 2016-01-28 LAB — VITAMIN B12: Vitamin B-12: 660 pg/mL (ref 180–914)

## 2016-01-28 LAB — FOLATE: Folate: 7 ng/mL (ref >5.9)

## 2016-01-28 MED ORDER — AMOXICILLIN-POT CLAVULANATE 875-125 MG PO TABS
1.0000 | ORAL_TABLET | Freq: Two times a day (BID) | ORAL | Status: DC
Start: 2016-01-28 — End: 2016-01-29
  Administered 2016-01-28 – 2016-01-29 (×2): 1 via ORAL
  Filled 2016-01-28 (×2): qty 1

## 2016-01-28 MED ORDER — PANTOPRAZOLE SODIUM 40 MG IV SOLR
40.0000 mg | Freq: Two times a day (BID) | INTRAVENOUS | Status: DC
  Administered 2016-01-28: 40 mg via INTRAVENOUS
  Filled 2016-01-28 (×2): qty 40

## 2016-01-28 MED ORDER — FLEET ENEMA 7-19 GM/118ML RE ENEM
1.0000 | ENEMA | Freq: Once | RECTAL | Status: AC
  Administered 2016-01-28: 1 via RECTAL

## 2016-01-28 NOTE — Progress Notes (Signed)
[facility] PROGRESS NOTE  Misty [name] CVE:[id number] DOB: 1941-05-01 DOA: 01/27/2016 PCP: Redge Gainer, MD  Assessment/Plan: 1. Symptomatic anemia. Patient's hemoglobin noted to be 7.5 on admission. She was given 2U PRBCs with improvement to 9.9. TSH is wnl. Anemia panel is in process. GI was consulted and will perform EGD tomorrow.  2. Pneumonia, possibly related to aspiration. Chest x-ray indicates possible pneumonia, although she does not have any fever, cough, leukocytosis. CT chest was ordered which again revealed a possible PNA as well as underlying interstitial lung disease. Will treat her with a course of augmentin 3. Generalized weakness, related to #1. Improved with transfusion.  4. GERD. Continue PPI.  5. Tobacco use disorder. Counseled on the importance of tobacco cessation.   Code Status: DNR DVT Prophylaxis: SCDs Family Communication: discussed with patient  Disposition Plan: discharge home once improved   Consultants:   GI  Procedures:   Transfused 2U PRBCS.   Antibiotics:  augmentin 3/12>>   HPI/Subjective: Feeling better after transfusion. More energy today. Had vomiting overnight. No cough at present   Objective: Filed Vitals:   01/27/16 2002 01/28/16 0543  BP: 119/55 105/52  Pulse: 82 80  Temp: 98.7 F (37.1 C) 98.4 F (36.9 C)  Resp: 20 20    Intake/Output Summary (Last 24 hours) at 01/28/16 0818 Last data filed at 01/28/16 0030  Gross per 24 hour  Intake   1440 ml  Output   1000 ml  Net    440 ml   Filed Weights   01/27/16 1141 01/27/16 1433  Weight: 53.524 kg (118 lb) 53.524 kg (118 lb)    Exam:  General: NAD, looks comfortable Cardiovascular: RRR, S1, S2  Respiratory: clear bilaterally, No wheezing, rales or rhonchi Abdomen: soft, non tender, no distention , bowel sounds normal Musculoskeletal: No edema b/l  Data Reviewed: Basic Metabolic Panel:  Recent Labs Lab 01/26/16 1133 01/27/16 1221 01/28/16 0608  NA  139 141 140  K 4.2 3.8 4.1  CL 100 105 106  CO2 23 28 28   GLUCOSE 119* 111* 106*  BUN 14 13 17   CREATININE 0.96 0.88 0.83  CALCIUM 9.7 8.7* 8.3*   Liver Function Tests:  Recent Labs Lab 01/26/16 1133 01/27/16 1221  AST 16 17  ALT 10 12*  ALKPHOS 112 83  BILITOT 0.3 0.5  PROT 6.8 6.3*  ALBUMIN 4.3 3.4*    CBC:  Recent Labs Lab 01/26/16 1133 01/27/16 1221 01/28/16 0608  WBC 8.9 7.8 7.8  NEUTROABS 6.6 6.0  --   HGB  --  7.5* 9.9*  HCT 27.1* 24.8* 31.5*  MCV 80 81.8 82.5  PLT 308 230 232   Cardiac Enzymes:  Recent Labs Lab 01/27/16 1221  TROPONINI <0.03   BNP (last 3 results)  Recent Labs  01/27/16 1221  BNP 90.0      Studies: Dg Chest 2 View  01/27/2016  CLINICAL DATA:  75 year old female with a history of weakness for 2 months. Smoker. EXAM: CHEST - 2 VIEW COMPARISON:  Chest x-ray 08/15/2014, 04/19/2014, abdominal CT 02/26/2015 FINDINGS: Cardiomediastinal silhouette unchanged in size and contour. Atherosclerotic calcifications of the aortic arch. Tortuosity of descending thoracic aorta. Stigmata of emphysema, with increased retrosternal airspace, flattened hemidiaphragms, increased AP diameter, and hyperinflation on the AP view. No confluent airspace disease. Coarsening of interstitial markings, more prominent than the comparison. Nodular opacity in the right mid lung has developed since the comparison. Opacities at the right mid base. No pleural effusion.  No pneumothorax. Changes of  vertebral augmentation again evident.  No new fracture. Unremarkable appearance of the upper abdomen. IMPRESSION: Nodular opacity in the right mid lung and medial base, concerning for infection given the history. Followup PA and lateral chest X-ray is recommended in 3-4 weeks following trial of antibiotic therapy to ensure resolution and exclude underlying malignancy. Emphysema. Atherosclerosis. Signed, Dulcy Fanny. Earleen Newport, DO [facility]  Radiology Electronically Signed   By: Corrie Mckusick D.O.   On: 01/27/2016 13:10   Ct Chest W Contrast  01/27/2016  CLINICAL DATA:  Indeterminate right lung opacities on chest radiograph from earlier today. EXAM: CT CHEST WITH CONTRAST TECHNIQUE: Multidetector CT imaging of the chest was performed during intravenous contrast administration. CONTRAST:  49m OMNIPAQUE IOHEXOL 300 MG/ML  SOLN COMPARISON:  Chest radiograph from earlier today. No prior chest CT. 02/26/2015 CT abdomen. FINDINGS: Mediastinum/Nodes: Normal heart size. No pericardial fluid/thickening. Coronary atherosclerosis. Great vessels are normal in course and caliber. No central pulmonary emboli. Normal visualized thyroid. There is a large amount of fluid in the mid to lower thoracic esophageal lumen, with no definite esophageal wall thickening . No pathologically enlarged axillary, mediastinal or hilar lymph nodes. Lungs/Pleura: No pneumothorax. No pleural effusion. There is patchy consolidation and ground-glass opacity in the medial basilar right lower lobe. There is patchy subpleural reticulation throughout both lungs, basilar predominant, which was present at the lung bases on the 02/26/2015 CT abdomen study, however may be slightly increased in the interval. No significant pulmonary nodules or lung masses in the aerated lungs. Upper abdomen: There are several small simple liver cysts, largest 1.6 cm. Additional subcentimeter hypodense liver lesions are too small to characterize and not definitely changed. Musculoskeletal: No aggressive appearing focal osseous lesions. Stable chronic moderate to severe T11 vertebral compression fracture status post vertebroplasty with associated focal kyphotic angulation. Diffuse osteopenia. Mild degenerative changes in the thoracic spine. IMPRESSION: 1. Patchy consolidation and ground-glass opacity in the medial basilar right lower lobe, favor an infectious pneumonia or aspiration pneumonitis. 2. This finding is  superimposed on patchy peripheral reticulation throughout both lungs, basilar predominant, suggestive of an underlying interstitial lung disease. Recommend a follow-up high-resolution chest CT study in 3 months. 3. Fluid in the thoracic esophagus, suggesting esophageal dysmotility and/or gastroesophageal reflux. 4. Coronary atherosclerosis. Electronically Signed   By: JIlona SorrelM.D.   On: 01/27/2016 20:02    Scheduled Meds: . famotidine  20 mg Oral BID  . feeding supplement  1 Container Oral TID BM  . pantoprazole  40 mg Oral Daily  . sodium chloride flush  3 mL Intravenous Q12H  . vitamin B-12  1,000 mcg Oral Daily   Continuous Infusions:   Principal Problem:   Symptomatic anemia Active Problems:   GERD (gastroesophageal reflux disease)   Hyperlipidemia   Generalized weakness   Abnormal chest x-ray   Tobacco use disorder    Time spent: 25 minutes   Jehanzeb Memon. MD [facility] Pager [phone] If 7PM-7AM, please contact night-coverage at [email or web address], password [email or web address]/10/2016, 8:18 AM

## 2016-01-28 NOTE — Care Management Obs Status (Signed)
MEDICARE OBSERVATION STATUS NOTIFICATION   Patient Details  Name: Misty [name] MRN: [id number] Date of Birth: 11/29/1940   Medicare Observation Status Notification Given:  Yes    Fuller Plan, RN 01/28/2016, 4:30 PM

## 2016-01-28 NOTE — Progress Notes (Signed)
Referring Provider: Kathie Dike, MD Primary Care Physician:  Redge Gainer, MD Primary Gastroenterologist:  Dr. Laural Golden  Reason for Consultation:    Anemia nausea and vomiting.  HPI:   Patient is 75 year old Caucasian female who has not been feeling well for past couple of months. She has been feeling very weak. She would also experience dyspnea on exertion and has been vomiting intermittently for the last 2 months or so. She states she has been busy helping her husband with his medical problems but could not tolerate her symptoms anymore. She went to see Dr. Redge Gainer. She was found to be anemic. She was therefore advised to go to emergency room. Yesterday in emergency room her hemoglobin was 7.5 and hematocrit 24.8. MCV was 81.8. Patient was therefore hospitalized and she has received 2 units of PRBCs. H&H is now 9.9 and 31.5. Her stool was noted to be heme negative. Patient states she's been vomiting off and on for at least 2 months. She would vomit within few minutes to couple fires of her meals. She also complains of intractable heartburn and burning in her throat. She took pantoprazole for several weeks but it did not help. She's been using times on daily basis. She denies hematemesis melena or frank rectal bleeding. She has an occasional hematochezia in the form of blood in the tissue. Her bowels are irregular. She has loose stools and then she passes balls. She has not had a bowel movement in 2 days. She says her appetite is not good. She continues to lose weight slowly. Since her last visit were office one year ago she has lost another 6 pounds. She denies abdominal pain. She also denies productive cough or dyspnea at rest. She has been getting short of breath on walking a few yards. She denies fever or chills. Review of the systems is negative for hematuria or vaginal bleeding. She does not take aspirin or other OTC NSAIDs. She does not drink alcohol but smokes about a pack a day. She  states she is been smoking for over 45 years. She is married. She is retired. 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 She has one grown up son who is now living with her. He does not have any siblings. She had EGD and colonoscopy 15 years ago when she was told that she had small hiatal hernia. She had colonoscopy by me in April, 2016 revealing sigmoid diverticulosis and mild changes of ischemic colitis. I felt the process was resolving.    Past Medical History  Diagnosis Date  . Migraines   . Osteoporosis   . Hyperlipidemia   . PONV (postoperative nausea and vomiting)   . History of bronchitis   . History of migraine     many yrs ago  . Numbness     left leg  . Chronic back pain   . Arthritis     back  . GERD (gastroesophageal reflux disease)     takes Ranidine daily  . Constipation     OTC stool softener prn  . Hemorrhoids   . Diverticulosis   . Urinary frequency   . Urinary urgency   . Nocturia   . Depression   . Anxiety     takes Xanax daily  . Insomnia   . History of shingles   . Cataract   . COPD (chronic obstructive pulmonary disease) [facility])     Past Surgical History  Procedure Laterality  Date  . Abdominal hysterectomy    . Esophagogastroduodenoscopy    . Colonoscopy    . Lumbar laminectomy/decompression microdiscectomy Bilateral 05/20/2013    Procedure: LUMBAR LAMINECTOMY/DECOMPRESSION MICRODISCECTOMY 1 LEVEL;  Surgeon: Hosie Spangle, MD;  Location: [place] [facility];  Service: Neurosurgery;  Laterality: Bilateral;  Bilateral Lumbar four-five laminotomy and left lumbar four-five microdiskectomy  . Eye surgery Right 3/15    cataracts  . Kyphoplasty N/A 07/08/2014    Procedure: Thoracic Eleven Kyphoplasty;  Surgeon: Hosie Spangle, MD;  Location: [place] [facility];  Service: Neurosurgery;  Laterality: N/A;  . Colonoscopy N/A 02/17/2015    Procedure: COLONOSCOPY;  Surgeon: Rogene Houston, MD;   Location: [facility];  Service: Endoscopy;  Laterality: N/A;  110n - moved to 11:15 - Ann to notify pt    Prior to Admission medications   Medication Sig Start Date End Date Taking? Authorizing Provider  acetaminophen (TYLENOL) 500 MG tablet Take 500 mg by mouth every 6 (six) hours as needed for moderate pain or headache.    Yes Historical Provider, MD  ALPRAZolam Duanne Moron) 0.5 MG tablet Take 1 tablet (0.5 mg total) by mouth 3 (three) times daily as needed. Patient taking differently: Take 0.5 mg by mouth 3 (three) times daily as needed for anxiety.  01/26/16  Yes Chipper Herb, MD  cholecalciferol (VITAMIN D) 1000 UNITS tablet Take 2,000-4,000 Units by mouth daily. Take 2000 units daily except take 4000 units on Saturday and Sunday.   Yes Historical Provider, MD  meclizine (ANTIVERT) 50 MG tablet Take 1 tablet (50 mg total) by mouth 3 (three) times daily as needed. Patient taking differently: Take 50 mg by mouth 3 (three) times daily as needed for dizziness.  09/05/15  Yes Sharion Balloon, FNP  Multiple Vitamins-Minerals (CENTRUM SILVER PO) Take 1 tablet by mouth daily.   Yes Historical Provider, MD  pantoprazole (PROTONIX) 40 MG tablet Take 1 tablet (40 mg total) by mouth daily. Patient taking differently: Take 40 mg by mouth daily as needed (heartburn).  09/05/15  Yes Sharion Balloon, FNP  ranitidine (ZANTAC) 150 MG tablet Take 150 mg by mouth 2 (two) times daily.    Yes Historical Provider, MD  vitamin B-12 (CYANOCOBALAMIN) 1000 MCG tablet Take 1,000 mcg by mouth daily.   Yes Historical Provider, MD    Current Facility-Administered Medications  Medication Dose Route Frequency Provider Last Rate Last Dose  . 0.9 %  sodium chloride infusion  250 mL Intravenous PRN Kathie Dike, MD      . acetaminophen (TYLENOL) tablet 650 mg  650 mg Oral Q6H PRN Kathie Dike, MD       Or  . acetaminophen (TYLENOL) suppository 650 mg  650 mg Rectal Q6H PRN Kathie Dike, MD      . ALPRAZolam Duanne Moron)  tablet 0.5 mg  0.5 mg Oral TID PRN Kathie Dike, MD   0.5 mg at 01/27/16 1939  . famotidine (PEPCID) tablet 20 mg  20 mg Oral BID Kathie Dike, MD   20 mg at 01/28/16 0840  . feeding supplement (BOOST / RESOURCE BREEZE) liquid 1 Container  1 Container Oral TID BM Kathie Dike, MD   1 Container at 01/27/16 1939  . meclizine (ANTIVERT) tablet 50 mg  50 mg Oral TID PRN Kathie Dike, MD      . ondansetron (ZOFRAN) tablet 4 mg  4 mg Oral Q6H PRN Kathie Dike, MD       Or  . ondansetron (ZOFRAN) injection 4 mg  4 mg Intravenous Q6H PRN Kathie Dike, MD   4 mg at 01/27/16 2131  . pantoprazole (PROTONIX) EC tablet 40 mg  40 mg Oral Daily Kathie Dike, MD   40 mg at 01/28/16 0840  . sodium chloride flush (NS) 0.9 % injection 3 mL  3 mL Intravenous Q12H Kathie Dike, MD   3 mL at 01/28/16 0841  . sodium chloride flush (NS) 0.9 % injection 3 mL  3 mL Intravenous PRN Kathie Dike, MD      . vitamin B-12 (CYANOCOBALAMIN) tablet 1,000 mcg  1,000 mcg Oral Daily Kathie Dike, MD   1,000 mcg at 01/28/16 0840    Allergies as of 01/27/2016 - Review Complete 01/27/2016  Allergen Reaction Noted  . Codeine Nausea And Vomiting 04/08/2013  . Asa [aspirin] Other (See Comments) 04/08/2013  . Azithromycin Other (See Comments) 04/08/2013  . Celebrex [celecoxib] Other (See Comments) 04/08/2013  . Cymbalta [duloxetine hcl] Other (See Comments) 04/08/2013  . Prednisone Rash 03/25/2013  . Vioxx [rofecoxib] Other (See Comments) 04/08/2013  . Zelnorm [tegaserod] Other (See Comments) 04/08/2013  . Zocor [simvastatin] Other (See Comments) 04/08/2013    Family History  Problem Relation Age of Onset  . Hypertension Mother   . Colon cancer Neg Hx     Social History   Social History  . Marital Status: Married    Spouse Name: N/A  . Number of Children: N/A  . Years of Education: N/A   Occupational History  . Not on file.   Social History Main Topics  . Smoking status: Current Some Day Smoker  -- 1.00 packs/day for 52 years    Types: Cigarettes    Start date: 11/18/1958    Last Attempt to Quit: 08/20/2011  . Smokeless tobacco: Never Used     Comment: 1/2-1pack off and on all her life  . Alcohol Use: No  . Drug Use: No  . Sexual Activity: Not Currently   Other Topics Concern  . Not on file   Social History Narrative    Review of Systems: See HPI, otherwise normal ROS  Physical Exam: Temp:  [97.4 F (36.3 C)-98.7 F (37.1 C)] 98.4 F (36.9 C) (03/12 0543) Pulse Rate:  [77-90] 80 (03/12 0543) Resp:  [16-28] 20 (03/12 0543) BP: (97-119)/(42-75) 105/52 mmHg (03/12 0543) SpO2:  [95 %-100 %] 95 % (03/12 0543) Weight:  [118 lb (53.524 kg)] 118 lb (53.524 kg) (03/11 1433) Last BM Date: 01/26/16  Patient is alert and in no acute distress. Conjunctiva is pale. Sclerae nonicteric. Oropharyngeal mucosa is normal. She has upper dental plate and her own teeth lower jaw. No neck masses or thyromegaly noted. Cardiac exam with regular rhythm normal S1 and S2. No murmur or gallop noted. Auscultation of lungs revealed diminished intensity of breath sounds bilaterally and few rhonchi at right base. Abdomen is flat. Bowel sounds are normal. No bruits noted. Stool palpated in sigmoid colon at LLQ. No hepatosplenomegaly. Rectal examination deferred. No peripheral edema or clubbing.  Lab Results:  Recent Labs  01/26/16 1133 01/27/16 1221 01/28/16 0608  WBC 8.9 7.8 7.8  HGB  --  7.5* 9.9*  HCT 27.1* 24.8* 31.5*  PLT 308 230 232   BMET  Recent Labs  01/26/16 1133 01/27/16 1221 01/28/16 0608  NA 139 141 140  K 4.2 3.8 4.1  CL 100 105 106  CO2 23 28 28   GLUCOSE 119* 111* 106*  BUN 14 13 17   CREATININE 0.96 0.88 0.83  CALCIUM 9.7 8.7* 8.3*  LFT  Recent Labs  01/26/16 1133 01/27/16 1221  PROT 6.8 6.3*  ALBUMIN 4.3 3.4*  AST 16 17  ALT 10 12*  ALKPHOS 112 83  BILITOT 0.3 0.5  BILIDIR 0.09  --    PT/INR  Recent Labs  01/27/16 1221  LABPROT 15.8*   INR 1.24   Hepatitis Panel No results for input(s): HEPBSAG, HCVAB, HEPAIGM, HEPBIGM in the last 72 hours.  Studies/Results: Dg Chest 2 View  01/27/2016  CLINICAL DATA:  75 year old female with a history of weakness for 2 months. Smoker. EXAM: CHEST - 2 VIEW COMPARISON:  Chest x-ray 08/15/2014, 04/19/2014, abdominal CT 02/26/2015 FINDINGS: Cardiomediastinal silhouette unchanged in size and contour. Atherosclerotic calcifications of the aortic arch. Tortuosity of descending thoracic aorta. Stigmata of emphysema, with increased retrosternal airspace, flattened hemidiaphragms, increased AP diameter, and hyperinflation on the AP view. No confluent airspace disease. Coarsening of interstitial markings, more prominent than the comparison. Nodular opacity in the right mid lung has developed since the comparison. Opacities at the right mid base. No pleural effusion.  No pneumothorax. Changes of vertebral augmentation again evident.  No new fracture. Unremarkable appearance of the upper abdomen. IMPRESSION: Nodular opacity in the right mid lung and medial base, concerning for infection given the history. Followup PA and lateral chest X-ray is recommended in 3-4 weeks following trial of antibiotic therapy to ensure resolution and exclude underlying malignancy. Emphysema. Atherosclerosis. Signed, Dulcy Fanny. Earleen Newport, DO [facility] Radiology Electronically Signed   By: Corrie Mckusick D.O.   On: 01/27/2016 13:10   Ct Chest W Contrast  01/27/2016  CLINICAL DATA:  Indeterminate right lung opacities on chest radiograph from earlier today. EXAM: CT CHEST WITH CONTRAST TECHNIQUE: Multidetector CT imaging of the chest was performed during intravenous contrast administration. CONTRAST:  50m OMNIPAQUE IOHEXOL 300 MG/ML  SOLN COMPARISON:  Chest radiograph from earlier today. No prior chest CT. 02/26/2015 CT abdomen. FINDINGS: Mediastinum/Nodes: Normal heart size. No pericardial  fluid/thickening. Coronary atherosclerosis. Great vessels are normal in course and caliber. No central pulmonary emboli. Normal visualized thyroid. There is a large amount of fluid in the mid to lower thoracic esophageal lumen, with no definite esophageal wall thickening . No pathologically enlarged axillary, mediastinal or hilar lymph nodes. Lungs/Pleura: No pneumothorax. No pleural effusion. There is patchy consolidation and ground-glass opacity in the medial basilar right lower lobe. There is patchy subpleural reticulation throughout both lungs, basilar predominant, which was present at the lung bases on the 02/26/2015 CT abdomen study, however may be slightly increased in the interval. No significant pulmonary nodules or lung masses in the aerated lungs. Upper abdomen: There are several small simple liver cysts, largest 1.6 cm. Additional subcentimeter hypodense liver lesions are too small to characterize and not definitely changed. Musculoskeletal: No aggressive appearing focal osseous lesions. Stable chronic moderate to severe T11 vertebral compression fracture status post vertebroplasty with associated focal kyphotic angulation. Diffuse osteopenia. Mild degenerative changes in the thoracic spine. IMPRESSION: 1. Patchy consolidation and ground-glass opacity in the medial basilar right lower lobe, favor an infectious pneumonia or aspiration pneumonitis. 2. This finding is superimposed on patchy peripheral reticulation throughout both lungs, basilar predominant, suggestive of an underlying interstitial lung disease. Recommend a follow-up high-resolution chest CT study in 3 months. 3. Fluid in the thoracic esophagus, suggesting esophageal dysmotility and/or gastroesophageal reflux. 4. Coronary atherosclerosis. Electronically Signed   By: JIlona SorrelM.D.   On: 01/27/2016 20:02    Assessment;  Patient's GI issues can be summarized  as below.  #1. Anemia. Stool is guaiac negative. She had colonoscopy in  April 2016 revealing resolving changes of ischemic colitis. Iron studies B12 and folate levels are pending. It remains to be seen if anemia secondary to deficiency or other etiologies. Anemia does not appear to be due to overt GI bleed. #2. Nausea and vomiting of over 2 months duration. Need to rule out peptic ulcer disease as well as other etiologies. #3. Intractable heartburn. Has not responded to PPI therapy. Burn could be secondary to peptic ulcer disease pyloric stenosis or gastroparesis. #4. Weight loss. She has lost 6 pounds in the last 11 months. She has extensive calcification to aorta and to takeoff of celiac trunk and SMA. She she may have to be evaluated further to rule out intestinal ischemia.  #5. Right lower lobe infiltrate. Patient does not have features of acute pneumonia. I wonder if she is been aspirating chronically. She is a smoker and therefore infiltrate could be secondary to etiologies other than infection.   Recommendations;  Clear liquids today. Diagnostic esophagogastroduodenoscopy in a.m. Fleet enema 1 this evening. Discontinue famotidine. Pantoprazole 40 mg IV every 12 hours.     Jia Mohamed U  01/28/2016, 10:57 AM

## 2016-01-29 ENCOUNTER — Other Ambulatory Visit: Payer: Self-pay | Admitting: *Deleted

## 2016-01-29 ENCOUNTER — Encounter (HOSPITAL_COMMUNITY): Payer: Self-pay | Admitting: *Deleted

## 2016-01-29 ENCOUNTER — Encounter (HOSPITAL_COMMUNITY)
Admission: EM | Disposition: A | Discharge status: Home / Self Care | Payer: Self-pay | Source: Home / Self Care | Attending: Emergency Medicine

## 2016-01-29 DIAGNOSIS — K219 Gastro-esophageal reflux disease without esophagitis: Secondary | ICD-10-CM | POA: Diagnosis not present

## 2016-01-29 DIAGNOSIS — K766 Portal hypertension: Secondary | ICD-10-CM | POA: Diagnosis not present

## 2016-01-29 DIAGNOSIS — R938 Abnormal findings on diagnostic imaging of other specified body structures: Secondary | ICD-10-CM | POA: Diagnosis not present

## 2016-01-29 DIAGNOSIS — K3189 Other diseases of stomach and duodenum: Secondary | ICD-10-CM | POA: Diagnosis not present

## 2016-01-29 DIAGNOSIS — R531 Weakness: Secondary | ICD-10-CM | POA: Diagnosis not present

## 2016-01-29 DIAGNOSIS — K208 Other esophagitis: Secondary | ICD-10-CM | POA: Diagnosis not present

## 2016-01-29 DIAGNOSIS — D649 Anemia, unspecified: Secondary | ICD-10-CM | POA: Diagnosis not present

## 2016-01-29 DIAGNOSIS — K210 Gastro-esophageal reflux disease with esophagitis: Secondary | ICD-10-CM | POA: Diagnosis not present

## 2016-01-29 DIAGNOSIS — K31819 Angiodysplasia of stomach and duodenum without bleeding: Secondary | ICD-10-CM | POA: Diagnosis not present

## 2016-01-29 DIAGNOSIS — E440 Moderate protein-calorie malnutrition: Secondary | ICD-10-CM | POA: Insufficient documentation

## 2016-01-29 DIAGNOSIS — Z1211 Encounter for screening for malignant neoplasm of colon: Secondary | ICD-10-CM

## 2016-01-29 HISTORY — PX: ESOPHAGOGASTRODUODENOSCOPY: SHX5428

## 2016-01-29 LAB — CBC
HCT: 34.4 % — ABNORMAL LOW (ref 36.0–46.0)
HEMOGLOBIN: 10.5 g/dL — AB (ref 12.0–15.0)
MCH: 26.1 pg (ref 26.0–34.0)
MCHC: 30.5 g/dL (ref 30.0–36.0)
MCV: 85.4 fL (ref 78.0–100.0)
Platelets: 237 10*3/uL (ref 150–400)
RBC: 4.03 MIL/uL (ref 3.87–5.11)
RDW: 15.7 % — ABNORMAL HIGH (ref 11.5–15.5)
WBC: 6.3 10*3/uL (ref 4.0–10.5)

## 2016-01-29 SURGERY — EGD (ESOPHAGOGASTRODUODENOSCOPY)
Anesthesia: Moderate Sedation

## 2016-01-29 MED ORDER — PANTOPRAZOLE SODIUM 40 MG PO TBEC
40.0000 mg | DELAYED_RELEASE_TABLET | Freq: Two times a day (BID) | ORAL | Status: DC
  Filled 2016-01-29: qty 1

## 2016-01-29 MED ORDER — MEPERIDINE HCL 50 MG/ML IJ SOLN
INTRAMUSCULAR | Status: AC
  Filled 2016-01-29: qty 1

## 2016-01-29 MED ORDER — STERILE WATER FOR IRRIGATION IR SOLN
Status: DC | PRN
  Administered 2016-01-29: 11:00:00

## 2016-01-29 MED ORDER — METOCLOPRAMIDE HCL 10 MG PO TABS
5.0000 mg | ORAL_TABLET | Freq: Three times a day (TID) | ORAL | Status: DC
  Administered 2016-01-29: 5 mg via ORAL
  Filled 2016-01-29 (×2): qty 1

## 2016-01-29 MED ORDER — METOCLOPRAMIDE HCL 5 MG PO TABS: 5.0000 mg | ORAL_TABLET | Freq: Three times a day (TID) | ORAL | Status: DC

## 2016-01-29 MED ORDER — SUCRALFATE 1 G PO TABS: 1.0000 g | ORAL_TABLET | Freq: Three times a day (TID) | ORAL | Status: DC

## 2016-01-29 MED ORDER — SODIUM CHLORIDE 0.9 % IV SOLN
510.0000 mg | Freq: Once | INTRAVENOUS | Status: AC
  Administered 2016-01-29: 510 mg via INTRAVENOUS
  Filled 2016-01-29: qty 17

## 2016-01-29 MED ORDER — MIDAZOLAM HCL 5 MG/5ML IJ SOLN
INTRAMUSCULAR | Status: AC
  Filled 2016-01-29: qty 10

## 2016-01-29 MED ORDER — AMOXICILLIN-POT CLAVULANATE 875-125 MG PO TABS: 1.0000 | ORAL_TABLET | Freq: Two times a day (BID) | ORAL | Status: DC

## 2016-01-29 MED ORDER — MIDAZOLAM HCL 5 MG/5ML IJ SOLN
INTRAMUSCULAR | Status: DC | PRN
  Administered 2016-01-29 (×4): 2 mg via INTRAVENOUS

## 2016-01-29 MED ORDER — PANTOPRAZOLE SODIUM 40 MG PO TBEC: 40.0000 mg | DELAYED_RELEASE_TABLET | Freq: Two times a day (BID) | ORAL | Status: DC

## 2016-01-29 MED ORDER — BUTAMBEN-TETRACAINE-BENZOCAINE 2-2-14 % EX AERO
INHALATION_SPRAY | CUTANEOUS | Status: DC | PRN
  Administered 2016-01-29: 2 via TOPICAL

## 2016-01-29 MED ORDER — MEPERIDINE HCL 50 MG/ML IJ SOLN
INTRAMUSCULAR | Status: DC | PRN
  Administered 2016-01-29 (×2): 25 mg via INTRAVENOUS

## 2016-01-29 MED ORDER — SODIUM CHLORIDE 0.9 % IV SOLN
INTRAVENOUS | Status: DC
  Administered 2016-01-29: 10:00:00 via INTRAVENOUS

## 2016-01-29 NOTE — Progress Notes (Signed)
Patient alert and oriented, independent, VSS, pt. Tolerating diet well. No complaints of pain or nausea. Pt. Had IV removed tip intact. Pt. Had prescriptions given. Pt. Voiced understanding of discharge instructions with no further questions. Pt. Discharged via wheelchair with auxilliary.  

## 2016-01-29 NOTE — Discharge Summary (Signed)
Physician Discharge Summary  Misty [name] VQQ:[id number] DOB: [birth year] DOA: 01/27/2016  PCP: Redge Gainer, MD  Admit date: 01/27/2016 Discharge date: 01/29/2016  Time spent: 35 minutes  Recommendations for Outpatient Follow-up:  1. Follow up with PCP within 1-2 weeks.  2. Follow up CXR in 3-4 weeks   Discharge Diagnoses:  Principal Problem:   Symptomatic anemia Active Problems:   GERD (gastroesophageal reflux disease)   Hyperlipidemia   Generalized weakness   Abnormal chest x-ray   Tobacco use disorder   Malnutrition of moderate degree   Discharge Condition: Improved   Diet recommendation: Heart healthy   Filed Weights   01/27/16 1141 01/27/16 1433 01/29/16 1015  Weight: 53.524 kg (118 lb) 53.524 kg (118 lb) 53.524 kg (118 lb)    History of present illness:  75 y.o. female with a hx of HLD, diverticulitis, COPD, hemorroids, anxiety, and depression had gone to her pcp office 3/10 with complaints of generalized weakness, dizziness on standing, shortness of breath on exertion and general fatigue. She had been experiencing these symptoms for several months now. Routine labs were checked and she was found to have a hemoglobin of 8.2. Her baseline hemoglobin is around 12. She was advised to present to the ED were hemoglobin was confirmed to be low at 7.5. Patient denies any history of hematochezia or melena. Her stool occult blood was negative. She reports having a colonoscopy in April of 2016. She does not take NSAIDS, but does endorse frequent reflux symptoms. She does not admit to any other sources of bleeding. She reports that she has been losing weight unintentionally. Her appetite has been poor. Due to her symptomatic anemia, she has been referred for admission.  Hospital Course:  Patient presented with symptomatic anemia with an hemoglobin noted to be 7.5 on admission. She was given 2U PRBCs with improvement noted. TSH is wnl. Anemia panel revealed low ferritin levels. EGD  performed by GI revealed reflux for which she will continue on PPI and start Carafate upon discharge. For her low ferritin she will receive of dose of IV iron prior to discharge. Her hgb remained stable and she is stable for discharge. For nausea and vomiting she was prescribed a course of Reglan.  1. Pneumonia, possibly related to aspiration. Chest x-ray indicates possible pneumonia, although she does not have any fever, cough, leukocytosis. CT chest was ordered which again revealed a possible PNA as well as underlying interstitial lung disease. Will treat and discharge her with a course of Augmentin. Recommend follow up CXR in 3-4 weeks.  2. Generalized weakness, related to #1. Improved with transfusion.  3. GERD. Continue PPI.  4. Tobacco use disorder. Counseled on the importance of tobacco cessation.  Procedures:  Transfused 2 Units PRBC  EGD: - Normal upper third of esophagus and middle third   of esophagus.  - LA Grade C erosive esophagitis.  - Small sliding hiatal hernia  - Portal hypertensive gastropathy.   - Gastric antral vascular ectasia without bleeding.  Consultations:  GI   Discharge Exam: Filed Vitals:   01/29/16 1145 01/29/16 1423  BP: 120/67 110/55  Pulse: 77 53  Temp:  97.4 F (36.3 C)  Resp: 16 18   General: NAD, looks comfortable Cardiovascular: RRR, S1, S2  Respiratory: clear bilaterally, No wheezing, rales or rhonchi Abdomen: soft, non tender, no distention , bowel sounds normal Musculoskeletal: No edema b/l   Discharge Instructions   Discharge Instructions    Diet - low sodium heart healthy  Complete by:  As directed      Increase activity slowly    Complete by:  As directed           Current Discharge Medication List    START taking these medications   Details  amoxicillin-clavulanate (AUGMENTIN) 875-125 MG tablet  Take 1 tablet by mouth every 12 (twelve) hours. Qty: 10 tablet, Refills: 0    metoCLOPramide (REGLAN) 5 MG tablet Take 1 tablet (5 mg total) by mouth 4 (four) times daily -  before meals and at bedtime. Qty: 120 tablet, Refills: 1    sucralfate (CARAFATE) 1 g tablet Take 1 tablet (1 g total) by mouth 4 (four) times daily -  with meals and at bedtime. Qty: 48 tablet, Refills: 0      CONTINUE these medications which have CHANGED   Details  pantoprazole (PROTONIX) 40 MG tablet Take 1 tablet (40 mg total) by mouth 2 (two) times daily before a meal. Qty: 90 tablet, Refills: 1   Associated Diagnoses: Gastroesophageal reflux disease with esophagitis      CONTINUE these medications which have NOT CHANGED   Details  acetaminophen (TYLENOL) 500 MG tablet Take 500 mg by mouth every 6 (six) hours as needed for moderate pain or headache.     ALPRAZolam (XANAX) 0.5 MG tablet Take 1 tablet (0.5 mg total) by mouth 3 (three) times daily as needed. Qty: 90 tablet, Refills: 2    cholecalciferol (VITAMIN D) 1000 UNITS tablet Take 2,000-4,000 Units by mouth daily. Take 2000 units daily except take 4000 units on Saturday and Sunday.    meclizine (ANTIVERT) 50 MG tablet Take 1 tablet (50 mg total) by mouth 3 (three) times daily as needed. Qty: 30 tablet, Refills: 0   Associated Diagnoses: Dizziness; Vertigo    Multiple Vitamins-Minerals (CENTRUM SILVER PO) Take 1 tablet by mouth daily.    ranitidine (ZANTAC) 150 MG tablet Take 150 mg by mouth 2 (two) times daily.     vitamin B-12 (CYANOCOBALAMIN) 1000 MCG tablet Take 1,000 mcg by mouth daily.       Allergies  Allergen Reactions  . Codeine Nausea And Vomiting  . Asa [Aspirin] Other (See Comments)    Patient is unaware of allergy  . Azithromycin Other (See Comments)    Patient is unaware of allergy  . Celebrex [Celecoxib] Other (See Comments)    Irritated stomach   . Cymbalta [Duloxetine Hcl] Other (See Comments)    Felt groggy  . Prednisone  Rash    She has seen the podiatrist and he had injected cortisone in her feet and she had also taken a peel at home. She had a severe reaction to her face with a rash and had to take Benadryl to resolve the symptom. She actually did get more cortisone shots, but no additional reactions to the shots.  . Vioxx [Rofecoxib] Other (See Comments)    Unknown  . Zelnorm [Tegaserod] Other (See Comments)    Patient is unaware of allergy  . Zocor [Simvastatin] Other (See Comments)    Patient is unaware of allergy      The results of significant diagnostics from this hospitalization (including imaging, microbiology, ancillary and laboratory) are listed below for reference.    Significant Diagnostic Studies: Dg Chest 2 View  01/27/2016  CLINICAL DATA:  75 year old female with a history of weakness for 2 months. Smoker. EXAM: CHEST - 2 VIEW COMPARISON:  Chest x-ray 08/15/2014, 04/19/2014, abdominal CT 02/26/2015 FINDINGS: Cardiomediastinal silhouette unchanged in size and contour.  Atherosclerotic calcifications of the aortic arch. Tortuosity of descending thoracic aorta. Stigmata of emphysema, with increased retrosternal airspace, flattened hemidiaphragms, increased AP diameter, and hyperinflation on the AP view. No confluent airspace disease. Coarsening of interstitial markings, more prominent than the comparison. Nodular opacity in the right mid lung has developed since the comparison. Opacities at the right mid base. No pleural effusion.  No pneumothorax. Changes of vertebral augmentation again evident.  No new fracture. Unremarkable appearance of the upper abdomen. IMPRESSION: Nodular opacity in the right mid lung and medial base, concerning for infection given the history. Followup PA and lateral chest X-ray is recommended in 3-4 weeks following trial of antibiotic therapy to ensure resolution and exclude underlying malignancy. Emphysema. Atherosclerosis. Signed, Dulcy Fanny. Earleen Newport, DO [facility] Electronically Signed   By: Corrie Mckusick D.O.   On: 01/27/2016 13:10   Ct Chest W Contrast  01/27/2016  CLINICAL DATA:  Indeterminate right lung opacities on chest radiograph from earlier today. EXAM: CT CHEST WITH CONTRAST TECHNIQUE: Multidetector CT imaging of the chest was performed during intravenous contrast administration. CONTRAST:  66m OMNIPAQUE IOHEXOL 300 MG/ML  SOLN COMPARISON:  Chest radiograph from earlier today. No prior chest CT. 02/26/2015 CT abdomen. FINDINGS: Mediastinum/Nodes: Normal heart size. No pericardial fluid/thickening. Coronary atherosclerosis. Great vessels are normal in course and caliber. No central pulmonary emboli. Normal visualized thyroid. There is a large amount of fluid in the mid to lower thoracic esophageal lumen, with no definite esophageal wall thickening . No pathologically enlarged axillary, mediastinal or hilar lymph nodes. Lungs/Pleura: No pneumothorax. No pleural effusion. There is patchy consolidation and ground-glass opacity in the medial basilar right lower lobe. There is patchy subpleural reticulation throughout both lungs, basilar predominant, which was present at the lung bases on the 02/26/2015 CT abdomen study, however may be slightly increased in the interval. No significant pulmonary nodules or lung masses in the aerated lungs. Upper abdomen: There are several small simple liver cysts, largest 1.6 cm. Additional subcentimeter hypodense liver lesions are too small to characterize and not definitely changed. Musculoskeletal: No aggressive appearing focal osseous lesions. Stable chronic moderate to severe T11 vertebral compression fracture status post vertebroplasty with associated focal kyphotic angulation. Diffuse osteopenia. Mild degenerative changes in the thoracic spine. IMPRESSION: 1. Patchy consolidation and ground-glass opacity in the medial basilar right lower lobe, favor an infectious  pneumonia or aspiration pneumonitis. 2. This finding is superimposed on patchy peripheral reticulation throughout both lungs, basilar predominant, suggestive of an underlying interstitial lung disease. Recommend a follow-up high-resolution chest CT study in 3 months. 3. Fluid in the thoracic esophagus, suggesting esophageal dysmotility and/or gastroesophageal reflux. 4. Coronary atherosclerosis. Electronically Signed   By: JIlona SorrelM.D.   On: 01/27/2016 20:02    Microbiology: No results found for this or any previous visit (from the past 240 hour(s)).   Labs: Basic Metabolic Panel:  Recent Labs Lab 01/26/16 1133 01/27/16 1221 01/28/16 0608  NA 139 141 140  K 4.2 3.8 4.1  CL 100 105 106  CO2 23 28 28   GLUCOSE 119* 111* 106*  BUN 14 13 17   CREATININE 0.96 0.88 0.83  CALCIUM 9.7 8.7* 8.3*   Liver Function Tests:  Recent Labs Lab 01/26/16 1133 01/27/16 1221  AST 16 17  ALT 10 12*  ALKPHOS 112 83  BILITOT 0.3 0.5  PROT 6.8 6.3*  ALBUMIN 4.3 3.4*  CBC:  Recent Labs Lab 01/26/16 1133 01/27/16 1221 01/28/16 0608 01/29/16 0543  WBC  8.9 7.8 7.8 6.3  NEUTROABS 6.6 6.0  --   --   HGB  --  7.5* 9.9* 10.5*  HCT 27.1* 24.8* 31.5* 34.4*  MCV 80 81.8 82.5 85.4  PLT 308 230 232 237   Cardiac Enzymes:  Recent Labs Lab 01/27/16 1221  TROPONINI <0.03   BNP: BNP (last 3 results)  Recent Labs  01/27/16 1221  BNP 90.0    Signed: Kathie Dike, MD  [facility] 01/29/2016, 3:09 PM    By signing my name below, I, Rennis Harding, attest that this documentation has been prepared under the direction and in the presence of Kathie Dike, MD. Electronically signed: Rennis Harding, Scribe. 01/29/2016 2:15pm  I, Dr. Kathie Dike, personally performed the services described in this documentaiton. All medical record entries made by the scribe were at my direction and in my presence. I have reviewed the chart and agree that the record reflects my personal  performance and is accurate and complete  Kathie Dike, MD, 01/29/2016 3:09 PM

## 2016-01-29 NOTE — Progress Notes (Addendum)
Initial Nutrition Assessment  DOCUMENTATION CODES:   Non-severe (moderate) malnutrition in context of chronic illness   Underweight  INTERVENTION:  Boost Breeze po TID, each supplement provides 250 kcal and 9 grams of protein   Nutrition staff to obtain and update food preferences daily    NUTRITION DIAGNOSIS:   Predicted suboptimal nutrient intake related to poor appetite as evidenced by per patient/family report, mild depletion of body fat, mild depletion of muscle mass.  GOAL:   Patient will meet greater than or equal to 90% of their needs  MONITOR:   Weight trends, PO intake, Labs  REASON FOR ASSESSMENT:   Malnutrition Screening Tool    ASSESSMENT:   75 y.o. female with a hx of HLD, diverticulitis, COPD, hemorroids, anxiety, and depression had gone to her pcp office yesterday with complaints of generalized weakness, dizziness on standing, shortness of breath on exertion and general fatigue. She had been experiencing these symptoms for several months now. Routine labs were checked and she was found to have a hemoglobin of 8.2. Her baseline hemoglobin is around 12. She was advised to present to the ED were hemoglobin was confirmed to be low at 7.5. Patient denies any history of hematochezia or melena. Her stool occult blood was negative. She reports having a colonoscopy in April of 2016. She does not take NSAIDS, but does endorse frequent reflux symptoms. She does not admit to any other sources of bleeding. She reports that she has been losing weight unintentionally  Pt was out earlier for EGD and per GI findings included:  erosive esophagitis, Small sliding hiatal hernia, Portal hypertensive gastropathy, Gastric antral vascular ectasia without bleeding.  Pt diet advanced following procedure and she has been able to consume 50% of her lunch. Pt says she lost 20# (9kg) after her back surgery and has not been able to regain it. Her appetite has been decreased -she eats a few  bites and feels full.   Nutrition focused exam: mild to moderate depletions of muscle and fat mass. No edema. Suspect chronic undernutrition due to pt complaints of unintentional weight loss and early satiety as well as the fact that she is a smoker (which is also likely impacting her daily nutrition intake).   Medications and labs reviewed. She is taking MVI, B-12, and Vitamin D.    Diet Order:  Diet Heart Room service appropriate?: Yes; Fluid consistency:: Thin Diet - low sodium heart healthy  Skin:   intact  Last BM:   01/26/16    Height:   Ht Readings from Last 1 Encounters:  01/29/16 5\' 7"  (1.702 m)    Weight:   Wt Readings from Last 1 Encounters:  01/29/16 118 lb (53.524 kg)    Ideal Body Weight:  61.3 kg  BMI:  Body mass index is 18.48 kg/(m^2).  Estimated Nutritional Needs:   Kcal:  1400-1600   Protein:  65-70 gr  Fluid:  1.4-1.6 liters daily  EDUCATION NEEDS:   No education needs identified at this time   Royann Shivers MS,RD,CSG,LDN Office: #[phone] Pager: [phone]

## 2016-01-29 NOTE — Op Note (Signed)
[facility] Patient Name: Misty [name] Procedure Date: 01/29/2016 11:18 AM MRN: [id number] Date of Birth: 13-Nov-1941 Attending MD: Hildred Laser , MD CSN: [id number] Age: 75 Admit Type: Inpatient Procedure:                Upper GI endoscopy Indications:              Gastro-esophageal reflux disease, Failure to                            respond to medical treatment, Anemia, Nausea with                            vomiting Providers:                Hildred Laser, MD, Gwenlyn Fudge, RN, Georgeann Oppenheim,                            Technician Referring MD:              Medicines:                Cetacaine spray, Meperidine 50 mg IV, Midazolam 8                            mg IV Complications:            No immediate complications. Estimated Blood Loss:     Estimated blood loss: none. Estimated blood loss:                            none. Procedure:                Pre-Anesthesia Assessment:                           - Prior to the procedure, a History and Physical                            was performed, and patient medications and                            allergies were reviewed. The patient's tolerance of                            previous anesthesia was also reviewed. The risks                            and benefits of the procedure and the sedation                            options and risks were discussed with the patient.                            All questions were answered, and informed consent                            was obtained. Prior Anticoagulants: The patient has  taken no previous anticoagulant or antiplatelet                            agents. ASA Grade Assessment: II - A patient with                            mild systemic disease. After reviewing the risks                            and benefits, the patient was deemed in                            satisfactory condition to undergo the procedure.                           After  obtaining informed consent, the endoscope was                            passed under direct vision. Throughout the                            procedure, the patient's blood pressure, pulse, and                            oxygen saturations were monitored continuously. The                            [id number] ([id number]) scope was introduced through the                            mouth, and advanced to the second part of duodenum.                            The upper GI endoscopy was accomplished without                            difficulty. The patient tolerated the procedure                            well. Scope In: 11:33:52 AM Scope Out: 11:43:31 AM Total Procedure Duration: 0 hours 9 minutes 39 seconds  Findings:      The upper third of the esophagus and middle third of the esophagus were       normal.      LA Grade C (one or more mucosal breaks continuous between tops of 2 or       more mucosal folds, less than 75% circumference) esophagitis with no       bleeding was found 37 cm from the incisors.      A 2 cm hiatal hernia was present.      Esophagogastric landmarks were identified: the gastroesophageal junction       was found at 41 cm and the site of hiatal narrowing was found at 43 cm       from the incisors.      Mild portal hypertensive gastropathy was  found in the gastric fundus.      Moderate gastric antral vascular ectasia without bleeding was present in       the gastric antrum.      The duodenal bulb and second portion of the duodenum were normal. Impression:               - Normal upper third of esophagus and middle third                            of esophagus.                           - LA Grade C erosive esophagitis.                           - Small sliding hiatal hernia                           - Portal hypertensive gastropathy.                           - Gastric antral vascular ectasia without bleeding.                           - No specimens  collected. Moderate Sedation:      Moderate (conscious) sedation was administered by the endoscopy nurse       and supervised by the endoscopist. The following parameters were       monitored: oxygen saturation, heart rate, blood pressure, CO2       capnography and response to care. Total physician intraservice time was       18 minutes. Recommendation:           - Return patient to hospital ward for ongoing care.                           - Low sodium diet today.                           - Continue present medications.                           - Perform an H. pylori serology today.                           - Use metoclopramide 5 mg PO QID; 30 min AC and HS. Procedure Code(s):        --- Professional ---                           [id number], Esophagogastroduodenoscopy, flexible,                            transoral; diagnostic, including collection of                            specimen(s) by brushing or washing, when performed                            (  separate procedure)                           G0500, Moderate sedation services provided by the                            same physician or other qualified health care                            professional performing a gastrointestinal                            endoscopic service that sedation supports,                            requiring the presence of an independent trained                            observer to assist in the monitoring of the                            patient's level of consciousness and physiological                            status; initial 15 minutes of intra-service time;                            patient age 48 years or older (additional time may                            be reported with [phone], as appropriate) Diagnosis Code(s):        --- Professional ---                           K20.8, Other esophagitis                           K76.6, Portal hypertension                           K31.89, Other  diseases of stomach and duodenum                           K31.819, Angiodysplasia of stomach and duodenum                            without bleeding                           K21.9, Gastro-esophageal reflux disease without                            esophagitis                           D64.9, Anemia, unspecified  R11.2, Nausea with vomiting, unspecified CPT copyright 2016 [employer]. All rights reserved. The codes documented in this report are preliminary and upon coder review may  be revised to meet current compliance requirements. Hildred Laser, MD Hildred Laser, MD 01/29/2016 12:10:11 PM This report has been signed electronically. Number of Addenda: 0

## 2016-01-29 NOTE — Progress Notes (Signed)
Pt. Returned from Endo for EGD procedure.  Report received from Ellin Saba.

## 2016-01-30 ENCOUNTER — Telehealth: Payer: Self-pay | Admitting: *Deleted

## 2016-01-30 LAB — H. PYLORI ANTIBODY, IGG

## 2016-01-30 NOTE — Telephone Encounter (Signed)
Call Completed and Appointment Scheduled: Yes, Date: 02/08/16 with Dr Silvestre Mesi INFORMATION Date of Discharge:01/29/16  Discharge Facility: [name]  Principal Discharge Diagnosis: Weakness, Anemia, pneumonia  Patient and/or caregiver is knowledgeable of his/her condition(s) and treatment: Yes  MEDICATION RECONCILIATION Medication list reviewed with patient:Yes Discharge Medications reconciled with home medications.   Patient is able to obtain needed medications:Yes  ACTIVITIES OF DAILY LIVING  Is the patient able to perform his/her own ADLs: Yes.    Patient is receiving home health services: No.  PATIENT EDUCATION Questions/Concerns Discussed: Patient was discharged late yesterday and has not picked up prescriptions yet. Her husband is going to pick them up this morning. She doesn't anticipate any problem with this. She will need a repeat CXR in 4 weeks. Discussed that she should call if her symptoms worsen before her appt.

## 2016-01-31 ENCOUNTER — Encounter (HOSPITAL_COMMUNITY): Payer: Self-pay | Admitting: Internal Medicine

## 2016-01-31 ENCOUNTER — Encounter (INDEPENDENT_AMBULATORY_CARE_PROVIDER_SITE_OTHER): Payer: Self-pay | Admitting: Internal Medicine

## 2016-02-06 ENCOUNTER — Encounter: Payer: Self-pay | Admitting: *Deleted

## 2016-02-08 ENCOUNTER — Ambulatory Visit: Payer: Medicare Other | Admitting: Family Medicine

## 2016-02-09 ENCOUNTER — Ambulatory Visit: Payer: Medicare Other | Admitting: Family Medicine

## 2016-02-09 ENCOUNTER — Encounter: Payer: Self-pay | Admitting: Family Medicine

## 2016-02-14 ENCOUNTER — Ambulatory Visit (INDEPENDENT_AMBULATORY_CARE_PROVIDER_SITE_OTHER): Payer: Medicare Other | Admitting: Family Medicine

## 2016-02-14 ENCOUNTER — Encounter: Payer: Self-pay | Admitting: Family Medicine

## 2016-02-14 ENCOUNTER — Other Ambulatory Visit: Payer: Medicare Other

## 2016-02-14 VITALS — BP 134/74 | HR 81 | Temp 97.9°F | Ht 67.0 in | Wt 123.0 lb

## 2016-02-14 DIAGNOSIS — R531 Weakness: Secondary | ICD-10-CM | POA: Diagnosis not present

## 2016-02-14 DIAGNOSIS — R938 Abnormal findings on diagnostic imaging of other specified body structures: Secondary | ICD-10-CM | POA: Diagnosis not present

## 2016-02-14 DIAGNOSIS — D649 Anemia, unspecified: Secondary | ICD-10-CM | POA: Diagnosis not present

## 2016-02-14 DIAGNOSIS — J189 Pneumonia, unspecified organism: Secondary | ICD-10-CM | POA: Diagnosis not present

## 2016-02-14 DIAGNOSIS — R9389 Abnormal findings on diagnostic imaging of other specified body structures: Secondary | ICD-10-CM

## 2016-02-14 DIAGNOSIS — K2951 Unspecified chronic gastritis with bleeding: Secondary | ICD-10-CM

## 2016-02-14 NOTE — Patient Instructions (Signed)
The patient needs a repeat chest x-ray mid April to follow-up abnormal chest x-ray from the hospital The patient needs a repeat CT scan high resolution in mid June to follow-up the abnormal CT She should follow-up with the gastroenterologist as planned She should continue with the proton X and the Carafate as recommended by the gastroenterologist She should continue to eat healthy and increase her exercise and activity We will call her with the results of the lab work as soon as these results become available

## 2016-02-14 NOTE — Progress Notes (Signed)
Subjective:    Patient ID: Misty [name], female    DOB: Apr 12, 1941, 75 y.o.   MRN: [id number]  HPI Patient here today for hospital follow up from [facility]. She was discharged on 01/30/16 with diagnosis of weakness and anemia. The patient was in the hospital she had an endoscopy which revealed severe esophagitis. At discharge she was placed on Zantac and pantoprazole. Her hemoglobin initially was down in the 7 range and the last one that was viewed on the record was up to the low 10 range. The patient continues to have weakness.  IN the Hospital she was transfused with 2 units of packed red blood cells. She also had an abnormal chest CT and chest x-ray and CT scanning revealed patchy consolidation and groundglass opacity in the medial basilar right lower lobe. A chest x-ray follow-up was recommended in one month with a follow-up CT in 3 months. She was treated with antibiotics and has completed these antibiotics. She is feeling better and has gained 3 pounds since her hospitalization. She is still weak. She denies any chest pain or increased shortness of breath. She is not seeing any blood in the stool nor has she had any abdominal pain and heartburn indigestion nausea or vomiting. She is passing her water without problems. She does have a return appointment to see the gastroenterologist in April.     Patient Active Problem List   Diagnosis Date Noted  . Malnutrition of moderate degree 01/29/2016  . Symptomatic anemia 01/27/2016  . Generalized weakness 01/27/2016  . Abnormal chest x-ray 01/27/2016  . Tobacco use disorder 01/27/2016  . Colitis   . Hematochezia 02/26/2015  . Ischemic colitis ([place]) 02/26/2015  . Orthostatic hypotension 02/26/2015  . Non-traumatic compression fracture of T11 thoracic vertebra 07/08/2014  . Hyperlipidemia 08/19/2013  . Fibrocystic breast disease 08/19/2013  . History of migraine headaches 08/19/2013  . GERD (gastroesophageal reflux disease) 04/07/2013  .  Osteoporosis, postmenopausal 04/07/2013   Outpatient Encounter Prescriptions as of 02/14/2016  Medication Sig  . acetaminophen (TYLENOL) 500 MG tablet Take 500 mg by mouth every 6 (six) hours as needed for moderate pain or headache.   . ALPRAZolam (XANAX) 0.5 MG tablet Take 1 tablet (0.5 mg total) by mouth 3 (three) times daily as needed. (Patient taking differently: Take 0.5 mg by mouth 3 (three) times daily as needed for anxiety. )  . cholecalciferol (VITAMIN D) 1000 UNITS tablet Take 2,000-4,000 Units by mouth daily. Take 2000 units daily except take 4000 units on Saturday and Sunday.  . meclizine (ANTIVERT) 50 MG tablet Take 1 tablet (50 mg total) by mouth 3 (three) times daily as needed. (Patient taking differently: Take 50 mg by mouth 3 (three) times daily as needed for dizziness. )  . metoCLOPramide (REGLAN) 5 MG tablet Take 1 tablet (5 mg total) by mouth 4 (four) times daily -  before meals and at bedtime.  . Multiple Vitamins-Minerals (CENTRUM SILVER PO) Take 1 tablet by mouth daily.  . pantoprazole (PROTONIX) 40 MG tablet Take 1 tablet (40 mg total) by mouth 2 (two) times daily before a meal.  . ranitidine (ZANTAC) 150 MG tablet Take 150 mg by mouth 2 (two) times daily.   . sucralfate (CARAFATE) 1 g tablet Take 1 tablet (1 g total) by mouth 4 (four) times daily -  with meals and at bedtime.  . vitamin B-12 (CYANOCOBALAMIN) 1000 MCG tablet Take 1,000 mcg by mouth daily.  . [DISCONTINUED] amoxicillin-clavulanate (AUGMENTIN) 875-125 MG tablet Take 1  tablet by mouth every 12 (twelve) hours.   No facility-administered encounter medications on file as of 02/14/2016.      Review of Systems  Constitutional: Positive for fatigue.  HENT: Negative.   Eyes: Negative.   Respiratory: Negative.   Cardiovascular: Negative.   Gastrointestinal: Negative.   Endocrine: Negative.   Genitourinary: Negative.   Musculoskeletal: Negative.   Skin: Negative.   Allergic/Immunologic: Negative.     Neurological: Positive for weakness.  Hematological: Negative.   Psychiatric/Behavioral: Negative.        Objective:   Physical Exam  Constitutional: She is oriented to person, place, and time. No distress.  Thin and frail-looking but alert  HENT:  [name] Normocephalic and atraumatic.  Nose: Nose normal.  Eyes: Conjunctivae and EOM are normal. Pupils are equal, round, and reactive to light. Right eye exhibits no discharge. Left eye exhibits no discharge. No scleral icterus.  Neck: Normal range of motion. Neck supple. No thyromegaly present.  Cardiovascular: Normal rate, regular rhythm, normal heart sounds and intact distal pulses.   No murmur heard. Heart is regular at 72/m  Pulmonary/Chest: Effort normal and breath sounds normal. No respiratory distress. She has no wheezes. She has no rales. She exhibits no tenderness.  Clear anteriorly and posteriorly  Abdominal: Soft. Bowel sounds are normal. She exhibits no mass. There is no tenderness. There is no rebound and no guarding.  No abdominal tenderness liver or spleen enlargement or suprapubic tenderness or masses. No bruits.  Musculoskeletal: Normal range of motion. She exhibits no edema.  Lymphadenopathy:    She has no cervical adenopathy.  Neurological: She is alert and oriented to person, place, and time.  Skin: Skin is warm and dry. No rash noted.  Psychiatric: She has a normal mood and affect. Her behavior is normal. Judgment and thought content normal.  Nursing note and vitals reviewed.  BP 134/74 mmHg  Pulse 81  Temp(Src) 97.9 F (36.6 C) (Oral)  Ht 5' 7"  (1.702 m)  Wt 123 lb (55.792 kg)  BMI 19.26 kg/m2        Assessment & Plan:  1. Symptomatic anemia -Hopefully this is improving pending results of lab work and transfusion with 2 units of packed red blood cells. - Vitamin B12 - BMP8+EGFR - CBC with Differential/Platelet - Ferritin  2. Generalized weakness -She is feeling stronger and gaining weight - DG  Chest 2 View; Future - CT Chest High Resolution; Future - Vitamin B12 - BMP8+EGFR - CBC with Differential/Platelet - Ferritin  3. Abnormal CT of the chest -It was recommended that a high resolution chest CT the repeated in 3 months which would be about the middle of June. - DG Chest 2 View; Future - CT Chest High Resolution; Future  4. Gastrointestinal hemorrhage associated with chronic gastritis -Continue with Carafate and proton X -Follow-up with gastroenterology as planned  5. Abnormal chest CT -Repeat chest CT in June as planned  6. CAP (community acquired pneumonia) -The patient has finished her antibiotics and we will repeat a chest x-ray mid-April.  Patient Instructions  The patient needs a repeat chest x-ray mid April to follow-up abnormal chest x-ray from the hospital The patient needs a repeat CT scan high resolution in mid June to follow-up the abnormal CT She should follow-up with the gastroenterologist as planned She should continue with the proton X and the Carafate as recommended by the gastroenterologist She should continue to eat healthy and increase her exercise and activity We will call her with the results  of the lab work as soon as these results become available   Arrie Senate MD

## 2016-02-15 LAB — CBC WITH DIFFERENTIAL/PLATELET
BASOS: 1 %
Basophils Absolute: 0 10*3/uL (ref 0.0–0.2)
EOS (ABSOLUTE): 0.4 10*3/uL (ref 0.0–0.4)
Eos: 6 %
Hematocrit: 33.7 % — ABNORMAL LOW (ref 34.0–46.6)
Hemoglobin: 10.7 g/dL — ABNORMAL LOW (ref 11.1–15.9)
IMMATURE GRANULOCYTES: 0 %
Immature Grans (Abs): 0 10*3/uL (ref 0.0–0.1)
Lymphocytes Absolute: 1.4 10*3/uL (ref 0.7–3.1)
Lymphs: 22 %
MCH: 26.6 pg (ref 26.6–33.0)
MCHC: 31.8 g/dL (ref 31.5–35.7)
MCV: 84 fL (ref 79–97)
MONOS ABS: 0.5 10*3/uL (ref 0.1–0.9)
Monocytes: 8 %
NEUTROS ABS: 3.9 10*3/uL (ref 1.4–7.0)
NEUTROS PCT: 63 %
PLATELETS: 208 10*3/uL (ref 150–379)
RBC: 4.03 x10E6/uL (ref 3.77–5.28)
RDW: 20.9 % — AB (ref 12.3–15.4)
WBC: 6.1 10*3/uL (ref 3.4–10.8)

## 2016-02-15 LAB — BMP8+EGFR
BUN/Creatinine Ratio: 12 (ref 11–26)
BUN: 8 mg/dL (ref 8–27)
CO2: 29 mmol/L (ref 18–29)
Calcium: 8.9 mg/dL (ref 8.7–10.3)
Chloride: 103 mmol/L (ref 96–106)
Creatinine, Ser: 0.69 mg/dL (ref 0.57–1.00)
GFR calc Af Amer: 99 mL/min/{1.73_m2} (ref >59)
GFR, EST NON AFRICAN AMERICAN: 86 mL/min/{1.73_m2} (ref >59)
GLUCOSE: 114 mg/dL — AB (ref 65–99)
POTASSIUM: 3.4 mmol/L — AB (ref 3.5–5.2)
SODIUM: 144 mmol/L (ref 134–144)

## 2016-02-15 LAB — VITAMIN B12: VITAMIN B 12: 809 pg/mL (ref 211–946)

## 2016-02-15 LAB — FERRITIN: Ferritin: 112 ng/mL (ref 15–150)

## 2016-02-22 ENCOUNTER — Ambulatory Visit (HOSPITAL_COMMUNITY)
Admission: RE | Admit: 2016-02-22 | Discharge: 2016-02-22 | Disposition: A | Discharge status: Home / Self Care | Payer: Medicare Other | Source: Ambulatory Visit | Attending: Family Medicine | Admitting: Family Medicine

## 2016-02-22 DIAGNOSIS — R938 Abnormal findings on diagnostic imaging of other specified body structures: Secondary | ICD-10-CM | POA: Insufficient documentation

## 2016-02-22 DIAGNOSIS — I2510 Atherosclerotic heart disease of native coronary artery without angina pectoris: Secondary | ICD-10-CM | POA: Insufficient documentation

## 2016-02-22 DIAGNOSIS — R0602 Shortness of breath: Secondary | ICD-10-CM | POA: Diagnosis not present

## 2016-02-22 DIAGNOSIS — R9389 Abnormal findings on diagnostic imaging of other specified body structures: Secondary | ICD-10-CM

## 2016-02-22 DIAGNOSIS — R531 Weakness: Secondary | ICD-10-CM | POA: Diagnosis not present

## 2016-02-26 ENCOUNTER — Telehealth: Payer: Self-pay | Admitting: *Deleted

## 2016-02-26 ENCOUNTER — Other Ambulatory Visit: Payer: Self-pay | Admitting: *Deleted

## 2016-02-26 DIAGNOSIS — R9389 Abnormal findings on diagnostic imaging of other specified body structures: Secondary | ICD-10-CM

## 2016-02-26 NOTE — Telephone Encounter (Signed)
-----   Message from Magdalene River, LPN sent at [id number]/73/5329  2:06 PM EDT -----   ----- Message -----    From: Ernestina Penna, MD    Sent: 02/26/2016   9:43 AM      To: Magdalene River, LPN, Carlon Chumley, Rad Tech, #  As per radiology report-----call patient with results and do a referral to pulmonary for further evaluation and make sure that Dr. Karilyn Cota gets a copy of this report -Also because of coronary calcification please make sure that she has an appointment to be evaluated by the cardiologist that comes to [facility]

## 2016-03-06 ENCOUNTER — Other Ambulatory Visit: Payer: Medicare Other

## 2016-03-06 ENCOUNTER — Other Ambulatory Visit: Payer: Self-pay | Admitting: *Deleted

## 2016-03-06 DIAGNOSIS — R531 Weakness: Secondary | ICD-10-CM

## 2016-03-06 DIAGNOSIS — R9389 Abnormal findings on diagnostic imaging of other specified body structures: Secondary | ICD-10-CM

## 2016-03-07 ENCOUNTER — Encounter (INDEPENDENT_AMBULATORY_CARE_PROVIDER_SITE_OTHER): Payer: Self-pay | Admitting: Internal Medicine

## 2016-03-07 ENCOUNTER — Ambulatory Visit (INDEPENDENT_AMBULATORY_CARE_PROVIDER_SITE_OTHER): Payer: Medicare Other | Admitting: Internal Medicine

## 2016-03-07 VITALS — BP 110/70 | HR 72 | Temp 97.7°F | Resp 18 | Ht 67.0 in | Wt 117.2 lb

## 2016-03-07 DIAGNOSIS — K2100 Gastro-esophageal reflux disease with esophagitis, without bleeding: Secondary | ICD-10-CM

## 2016-03-07 DIAGNOSIS — D638 Anemia in other chronic diseases classified elsewhere: Secondary | ICD-10-CM

## 2016-03-07 DIAGNOSIS — R634 Abnormal weight loss: Secondary | ICD-10-CM

## 2016-03-07 NOTE — Patient Instructions (Signed)
Discontinue sucralfate. CBC next week.

## 2016-03-07 NOTE — Progress Notes (Signed)
Presenting complaint;  Follow-up for erosive reflux esophagitis anemia anorexia and weight loss  Subjective:  Misty [name] 75 year old Caucasian female who is here for scheduled visit. She was hospitalized last month for nausea vomiting symptomatic anemia and she was also treated for pneumonia possibly aspiration in etiology. EGD revealed erosive/ulcerative reflux esophagitis. Her stool was guaiac negative. She received 2 units of PRBCs. She was discharged in pantoprazole metoclopramide and sucralfate. She continues to feel weak and has no energy. However she has not had a more episodes of nausea and vomiting. She is having heartburn 3 times a week which only occurs with certain foods. She also has noted intermittent dysphagia with solids. She points to lower sternal area as site of bolus obstruction. She has not had an episode of food impaction. She continues to complain of lower back pain which she says is constant and only time she gets relief is when she sitting or lying flat. She is not able to do much exercise or walking. She is not taking pain medications other than Tylenol. She states she has been under a lot of stress as she is loss 3 members of family and her close friend in the last 4 weeks. She lost her cousin at age 69 neighbors daughter who was in her 71s and close family friend. Her niece's husband died and her husband's nephew also died recently. She had blood work by Dr. Laurance Flatten 3 weeks ago. This is reviewed below. She denies melena or rectal bleeding. Her bowels move anywhere from 2-8 times per day. On most days she has to 3 stools. Most of her stools are soft. She is taking Zantac twice a day.   Current Medications: Outpatient Encounter Prescriptions as of 03/07/2016  Medication Sig  . acetaminophen (TYLENOL) 500 MG tablet Take 500 mg by mouth every 6 (six) hours as needed for moderate pain or headache.   . ALPRAZolam (XANAX) 0.5 MG tablet Take 1 tablet (0.5 mg total) by mouth 3 (three)  times daily as needed. (Patient taking differently: Take 0.5 mg by mouth 3 (three) times daily as needed for anxiety. )  . cholecalciferol (VITAMIN D) 1000 UNITS tablet Take 2,000-4,000 Units by mouth daily. Take 2000 units daily except take 4000 units on Saturday and Sunday.  . meclizine (ANTIVERT) 50 MG tablet Take 1 tablet (50 mg total) by mouth 3 (three) times daily as needed. (Patient taking differently: Take 50 mg by mouth 3 (three) times daily as needed for dizziness. )  . metoCLOPramide (REGLAN) 5 MG tablet Take 1 tablet (5 mg total) by mouth 4 (four) times daily -  before meals and at bedtime.  . Multiple Vitamins-Minerals (CENTRUM SILVER PO) Take 1 tablet by mouth daily.  . pantoprazole (PROTONIX) 40 MG tablet Take 1 tablet (40 mg total) by mouth 2 (two) times daily before a meal.  . ranitidine (ZANTAC) 150 MG tablet Take 150 mg by mouth 2 (two) times daily.   . sucralfate (CARAFATE) 1 g tablet Take 1 tablet (1 g total) by mouth 4 (four) times daily -  with meals and at bedtime.  . vitamin B-12 (CYANOCOBALAMIN) 1000 MCG tablet Take 1,000 mcg by mouth daily.   No facility-administered encounter medications on file as of 03/07/2016.     Objective: Blood pressure 110/70, pulse 72, temperature 97.7 F (36.5 C), temperature source Oral, resp. rate 18, height 5' 7"  (1.702 m), weight 117 lb 3.2 oz (53.162 kg). Patient is alert and appears to be chronically ill. She does not have  tremors or abnormal movements to lips or tongue. Conjunctiva is pink. Sclera is nonicteric Oropharyngeal mucosa is normal. No neck masses or thyromegaly noted. Cardiac exam with regular rhythm normal S1 and S2. No murmur or gallop noted. Lungs are clear to auscultation. Abdomen is symmetrical. Abdomen is soft and nontender. Sigmoid colon is palpable. No organomegaly or masses. No LE edema or clubbing noted.  Labs/studies Results: Lab data from 02/14/2016 WBC 6.1, H&H 10.7 and 33.7 and platelet count 208K Serum  ferritin 112  H&H prior to discharge on 01/29/2016 was 10.5 and 34.4. H&H on 01/27/2016 prior to transfusion was 7.5 and 24.8  Assessment:  #1. Erosive/ulcerative reflux esophagitis. She is doing better with therapy. She is tolerating metoclopramide at low dose. She now has developed solid food dysphagia and may need esophageal dilation down the road. She may have developed esophageal ring or stricture with healing of esophagitis. #2. Anemia. Anemia appears to be multifactorial. She received 2 units of PRBCs during hospitalization in March 2017. Serum ferritin was low but iron studies were not consistent with iron deficiency anemia. She possibly could have been losing blood from erosive esophagitis. H&H is coming up. #3. Anorexia secondary to chronic illness. If appetite does not improve will consider gastric emptying study off metoclopramide. #4. Weight loss secondary to diminished intake. She has only lost 1 pound in the last 5 weeks. Patient needs to eat snacks between meals. If she is able to some exercise input also help improve her appetite.   Plan:  Discontinue sucralfate. Take ranitidine 150 mg by mouth daily at bedtime when necessary for breakthrough symptoms. Continue pantoprazole and metoclopramide at current dose. Patient encouraged to eat snack between meals and should try to exercise as tolerated. CBC in 1 week. Office visit in 8 weeks.

## 2016-03-08 ENCOUNTER — Other Ambulatory Visit (INDEPENDENT_AMBULATORY_CARE_PROVIDER_SITE_OTHER): Payer: Self-pay | Admitting: *Deleted

## 2016-03-08 ENCOUNTER — Encounter (INDEPENDENT_AMBULATORY_CARE_PROVIDER_SITE_OTHER): Payer: Self-pay | Admitting: Internal Medicine

## 2016-03-08 DIAGNOSIS — D638 Anemia in other chronic diseases classified elsewhere: Secondary | ICD-10-CM

## 2016-03-13 ENCOUNTER — Other Ambulatory Visit: Payer: Medicare Other

## 2016-03-13 DIAGNOSIS — E876 Hypokalemia: Secondary | ICD-10-CM | POA: Diagnosis not present

## 2016-03-13 LAB — BMP8+EGFR
BUN / CREAT RATIO: 12 (ref 12–28)
BUN: 9 mg/dL (ref 8–27)
CO2: 27 mmol/L (ref 18–29)
Calcium: 8.8 mg/dL (ref 8.7–10.3)
Chloride: 103 mmol/L (ref 96–106)
Creatinine, Ser: 0.74 mg/dL (ref 0.57–1.00)
GFR, EST AFRICAN AMERICAN: 92 mL/min/{1.73_m2} (ref >59)
GFR, EST NON AFRICAN AMERICAN: 80 mL/min/{1.73_m2} (ref >59)
Glucose: 106 mg/dL — ABNORMAL HIGH (ref 65–99)
POTASSIUM: 3.9 mmol/L (ref 3.5–5.2)
SODIUM: 143 mmol/L (ref 134–144)

## 2016-03-20 ENCOUNTER — Ambulatory Visit: Payer: Medicare Other | Admitting: Cardiology

## 2016-05-07 ENCOUNTER — Ambulatory Visit (INDEPENDENT_AMBULATORY_CARE_PROVIDER_SITE_OTHER): Payer: Medicare Other | Admitting: Internal Medicine

## 2016-05-26 ENCOUNTER — Other Ambulatory Visit: Payer: Self-pay | Admitting: Family Medicine

## 2016-06-04 ENCOUNTER — Other Ambulatory Visit: Payer: Self-pay | Admitting: Family Medicine

## 2016-06-13 ENCOUNTER — Ambulatory Visit (INDEPENDENT_AMBULATORY_CARE_PROVIDER_SITE_OTHER): Payer: Medicare Other | Admitting: Family Medicine

## 2016-06-13 ENCOUNTER — Encounter: Payer: Self-pay | Admitting: Family Medicine

## 2016-06-13 VITALS — BP 122/70 | HR 83 | Temp 97.8°F | Ht 67.0 in | Wt 118.0 lb

## 2016-06-13 DIAGNOSIS — K589 Irritable bowel syndrome without diarrhea: Secondary | ICD-10-CM

## 2016-06-13 DIAGNOSIS — K219 Gastro-esophageal reflux disease without esophagitis: Secondary | ICD-10-CM | POA: Diagnosis not present

## 2016-06-13 DIAGNOSIS — R5383 Other fatigue: Secondary | ICD-10-CM | POA: Diagnosis not present

## 2016-06-13 DIAGNOSIS — E785 Hyperlipidemia, unspecified: Secondary | ICD-10-CM

## 2016-06-13 DIAGNOSIS — R197 Diarrhea, unspecified: Secondary | ICD-10-CM | POA: Diagnosis not present

## 2016-06-13 DIAGNOSIS — D649 Anemia, unspecified: Secondary | ICD-10-CM | POA: Diagnosis not present

## 2016-06-13 DIAGNOSIS — R531 Weakness: Secondary | ICD-10-CM | POA: Diagnosis not present

## 2016-06-13 DIAGNOSIS — E559 Vitamin D deficiency, unspecified: Secondary | ICD-10-CM

## 2016-06-13 DIAGNOSIS — R195 Other fecal abnormalities: Secondary | ICD-10-CM

## 2016-06-13 NOTE — Progress Notes (Signed)
Subjective:    Patient ID: Misty [name], female    DOB: November 03, 1941, 75 y.o.   MRN: [id number]  HPI Pt here for follow up and management of chronic medical problems which includes hyperlipidemia. She is taking medications regularly.The patient continues to have ongoing fatigue. Her stomach is upset after eating and she is still seeing the gastroenterologist. She will get lab work today. Her vital signs are stable. Her weight is up 1 pound since the last visit. She is complaining of weakness and fatigue. She still has problems with her active gastrocolic reflex. She says every time that she eats even if his sister cracker she has a bowel movement. Most the time the bowel movement is soft and sometimes liquid. If she takes Imodium and takes too much she gets constipated. She has an appointment to see the gastroenterologist again in September. She denies any chest pain or shortness of breath. She doesn't have any trouble with swallowing heartburn indigestion nausea or vomiting. She does take her omeprazole and Reglan as prescribed by the gastroenterologist. She has no trouble with passing her water she just voids a lot and she drinks a lot of water and stay well hydrated.     Patient Active Problem List   Diagnosis Date Noted  . Malnutrition of moderate degree 01/29/2016  . Symptomatic anemia 01/27/2016  . Generalized weakness 01/27/2016  . Abnormal chest x-ray 01/27/2016  . Tobacco use disorder 01/27/2016  . Colitis   . Hematochezia 02/26/2015  . Ischemic colitis ([place]) 02/26/2015  . Orthostatic hypotension 02/26/2015  . Non-traumatic compression fracture of T11 thoracic vertebra 07/08/2014  . Hyperlipidemia 08/19/2013  . Fibrocystic breast disease 08/19/2013  . History of migraine headaches 08/19/2013  . GERD (gastroesophageal reflux disease) 04/07/2013  . Osteoporosis, postmenopausal 04/07/2013   Outpatient Encounter Prescriptions as of 06/13/2016  Medication Sig  . acetaminophen  (TYLENOL) 500 MG tablet Take 500 mg by mouth every 6 (six) hours as needed for moderate pain or headache.   . ALPRAZolam (XANAX) 0.5 MG tablet TAKE  (1)  TABLET  THREE TIMES DAILY AS NEEDED.  . cholecalciferol (VITAMIN D) 1000 UNITS tablet Take 2,000-4,000 Units by mouth daily. Take 2000 units daily except take 4000 units on Saturday and Sunday.  . meclizine (ANTIVERT) 50 MG tablet Take 1 tablet (50 mg total) by mouth 3 (three) times daily as needed. (Patient taking differently: Take 50 mg by mouth 3 (three) times daily as needed for dizziness. )  . metoCLOPramide (REGLAN) 5 MG tablet TAKE 1 TABLET 4 TIMES A DAY BEFORE MEALS AND AT BEDTIME  . Multiple Vitamins-Minerals (CENTRUM SILVER PO) Take 1 tablet by mouth daily.  . pantoprazole (PROTONIX) 40 MG tablet TAKE 1 TABLET (40 MG TOTAL) BY MOUTH 2 (TWO) TIMES DAILY BEFORE A MEAL.  . ranitidine (ZANTAC) 150 MG tablet Take 150 mg by mouth at bedtime as needed.  . vitamin B-12 (CYANOCOBALAMIN) 1000 MCG tablet Take 1,000 mcg by mouth daily.   No facility-administered encounter medications on file as of 06/13/2016.       Review of Systems  Constitutional: Positive for fatigue.  HENT: Negative.   Eyes: Negative.   Respiratory: Negative.   Cardiovascular: Negative.   Gastrointestinal: Positive for abdominal pain and diarrhea.  Endocrine: Negative.   Genitourinary: Negative.   Musculoskeletal: Negative.   Skin: Negative.   Allergic/Immunologic: Negative.   Neurological: Negative.   Hematological: Negative.   Psychiatric/Behavioral: Negative.        Objective:   Physical Exam  Constitutional: She is oriented to person, place, and time. No distress.  The patient is slender and then and somewhat frail appearing but alert and pleasant.  HENT:  [name] Normocephalic and atraumatic.  Right Ear: External ear normal.  Left Ear: External ear normal.  Nose: Nose normal.  Mouth/Throat: Oropharynx is clear and moist. No oropharyngeal exudate.  Eyes:  Conjunctivae and EOM are normal. Pupils are equal, round, and reactive to light. Right eye exhibits no discharge. Left eye exhibits no discharge. No scleral icterus.  Neck: Normal range of motion. Neck supple. No thyromegaly present.  No thyromegaly anterior cervical adenopathy or bruits  Cardiovascular: Normal rate, regular rhythm, normal heart sounds and intact distal pulses.   No murmur heard. Heart has a regular rate and rhythm at 72/m  Pulmonary/Chest: Effort normal. No respiratory distress. She has wheezes. She has no rales.  There are a few scattered wheezes bilaterally.  Abdominal: Soft. Bowel sounds are normal. She exhibits no mass. There is tenderness. There is no rebound and no guarding.  There is no liver or spleen enlargement. There was slight tenderness in the left lower quadrant. There are no Masses and bowel sounds were normal  Musculoskeletal: Normal range of motion. She exhibits no edema.  The patient has had back surgery. She is better but she still has pain with laying down and sitting up and with certain movements in her back.  Lymphadenopathy:    She has no cervical adenopathy.  Neurological: She is alert and oriented to person, place, and time. She has normal reflexes. No cranial nerve deficit.  Skin: Skin is warm and dry. No rash noted.  Psychiatric: Her behavior is normal. Judgment and thought content normal.  Somewhat depressed affect.  Nursing note and vitals reviewed.  BP 122/70 (BP Location: Right Arm)   Pulse 83   Temp 97.8 F (36.6 C) (Oral)   Ht '5\' 7"'$  (1.702 m)   Wt 118 lb (53.5 kg)   BMI 18.48 kg/m         Assessment & Plan:  1. Vitamin D deficiency -Continue current treatment pending results of lab work - CBC with Differential/Platelet - VITAMIN D 25 Hydroxy (Vit-D Deficiency, Fractures)  2. Hyperlipidemia -Continue diet habits and exercise to lower her cholesterol - BMP8+EGFR - CBC with Differential/Platelet - Hepatic function panel -  Lipid panel  3. Gastroesophageal reflux disease, esophagitis presence not specified -Continue pantoprazole as prescribed by gastroenterologist - CBC with Differential/Platelet - Hepatic function panel  4. Other fatigue -We will discuss the GI issues with the gastroenterologist to see if he has any other suggestions before the patient visits him in September - BMP8+EGFR - CBC with Differential/Platelet - Hepatic function panel - Lipid panel - VITAMIN D 25 Hydroxy (Vit-D Deficiency, Fractures) - Thyroid Panel With TSH  5. IBS (irritable bowel syndrome) -Frequent bowel movements with an active gastrocolic reflex.  6. Loose bowel movements -Continue current treatment and as needed Imodium until we discuss the issues with your gastroenterologist  Patient Instructions                       Medicare Annual Wellness Visit  [facility] and the medical providers at [facility] strive to bring you the best medical care.  In doing so we not only want to address your current medical conditions and concerns but also to detect new conditions early and prevent illness, disease and health-related problems.    Medicare offers a yearly Wellness  Visit which allows our clinical staff to assess your need for preventative services including immunizations, lifestyle education, counseling to decrease risk of preventable diseases and screening for fall risk and other medical concerns.    This visit is provided free of charge (no copay) for all Medicare recipients. The clinical pharmacists at [facility] have begun to conduct these Wellness Visits which will also include a thorough review of all your medications.    As you primary medical provider recommend that you make an appointment for your Annual Wellness Visit if you have not done so already this year.  You may set up this appointment before you leave today or you may call back ([phone]) and schedule an  appointment.  Please make sure when you call that you mention that you are scheduling your Annual Wellness Visit with the clinical pharmacist so that the appointment may be made for the proper length of time.     Continue current medications. Continue good therapeutic lifestyle changes which include good diet and exercise. Fall precautions discussed with patient. If an FOBT was given today- please return it to our front desk. If you are over 66 years old - you may need Prevnar 45 or the adult Pneumonia vaccine.  **Flu shots are available--- please call and schedule a FLU-CLINIC appointment**  After your visit with [place] today you will receive a survey in the mail or online from [employer] regarding your care with [place]. Please take a moment to fill this out. Your feedback is very important to [place] as you can help [place] better understand your patient needs as well as improve your experience and satisfaction. WE CARE ABOUT YOU!!!   Continue to drink plenty of fluids and stay well hydrated We will discuss your findings today with the gastroenterologist to see if he would recommend any other changes in your medication prior to visiting him in September. If we don't get in touch with him today we will call him when he is there and discuss this with him then and then give you a call back.    Arrie Senate MD

## 2016-06-13 NOTE — Patient Instructions (Addendum)
Medicare Annual Wellness Visit  [place] and the medical providers at [facility] strive to bring you the best medical care.  In doing so we not only want to address your current medical conditions and concerns but also to detect new conditions early and prevent illness, disease and health-related problems.    Medicare offers a yearly Wellness Visit which allows our clinical staff to assess your need for preventative services including immunizations, lifestyle education, counseling to decrease risk of preventable diseases and screening for fall risk and other medical concerns.    This visit is provided free of charge (no copay) for all Medicare recipients. The clinical pharmacists at [facility] have begun to conduct these Wellness Visits which will also include a thorough review of all your medications.    As you primary medical provider recommend that you make an appointment for your Annual Wellness Visit if you have not done so already this year.  You may set up this appointment before you leave today or you may call back ([phone]) and schedule an appointment.  Please make sure when you call that you mention that you are scheduling your Annual Wellness Visit with the clinical pharmacist so that the appointment may be made for the proper length of time.     Continue current medications. Continue good therapeutic lifestyle changes which include good diet and exercise. Fall precautions discussed with patient. If an FOBT was given today- please return it to our front desk. If you are over 34 years old - you may need Prevnar 13 or the adult Pneumonia vaccine.  **Flu shots are available--- please call and schedule a FLU-CLINIC appointment**  After your visit with [place] today you will receive a survey in the mail or online from [employer] regarding your care with [place]. Please take a moment to fill this out. Your feedback is very  important to [place] as you can help [place] better understand your patient needs as well as improve your experience and satisfaction. WE CARE ABOUT YOU!!!   Continue to drink plenty of fluids and stay well hydrated We will discuss your findings today with the gastroenterologist to see if he would recommend any other changes in your medication prior to visiting him in September. If we don't get in touch with him today we will call him when he is there and discuss this with him then and then give you a call back.

## 2016-06-14 LAB — BMP8+EGFR
BUN/Creatinine Ratio: 14 (ref 12–28)
BUN: 11 mg/dL (ref 8–27)
CALCIUM: 9 mg/dL (ref 8.7–10.3)
CHLORIDE: 104 mmol/L (ref 96–106)
CO2: 27 mmol/L (ref 18–29)
Creatinine, Ser: 0.8 mg/dL (ref 0.57–1.00)
GFR calc Af Amer: 83 mL/min/{1.73_m2} (ref >59)
GFR, EST NON AFRICAN AMERICAN: 72 mL/min/{1.73_m2} (ref >59)
GLUCOSE: 114 mg/dL — AB (ref 65–99)
POTASSIUM: 4.2 mmol/L (ref 3.5–5.2)
SODIUM: 142 mmol/L (ref 134–144)

## 2016-06-14 LAB — CBC WITH DIFFERENTIAL/PLATELET
BASOS ABS: 0 10*3/uL (ref 0.0–0.2)
Basos: 1 %
EOS (ABSOLUTE): 0.3 10*3/uL (ref 0.0–0.4)
Eos: 4 %
HEMATOCRIT: 29.8 % — AB (ref 34.0–46.6)
Hemoglobin: 8.9 g/dL — CL (ref 11.1–15.9)
IMMATURE GRANULOCYTES: 0 %
Immature Grans (Abs): 0 10*3/uL (ref 0.0–0.1)
LYMPHS: 24 %
Lymphocytes Absolute: 1.6 10*3/uL (ref 0.7–3.1)
MCH: 25.1 pg — ABNORMAL LOW (ref 26.6–33.0)
MCHC: 29.9 g/dL — ABNORMAL LOW (ref 31.5–35.7)
MCV: 84 fL (ref 79–97)
MONOS ABS: 0.5 10*3/uL (ref 0.1–0.9)
Monocytes: 8 %
NEUTROS PCT: 63 %
Neutrophils Absolute: 4.1 10*3/uL (ref 1.4–7.0)
PLATELETS: 234 10*3/uL (ref 150–379)
RBC: 3.54 x10E6/uL — AB (ref 3.77–5.28)
RDW: 15.5 % — AB (ref 12.3–15.4)
WBC: 6.4 10*3/uL (ref 3.4–10.8)

## 2016-06-14 LAB — HEPATIC FUNCTION PANEL
ALBUMIN: 4.1 g/dL (ref 3.5–4.8)
ALT: 8 IU/L (ref 0–32)
AST: 14 IU/L (ref 0–40)
Alkaline Phosphatase: 98 IU/L (ref 39–117)
BILIRUBIN TOTAL: 0.2 mg/dL (ref 0.0–1.2)
Bilirubin, Direct: 0.08 mg/dL (ref 0.00–0.40)
TOTAL PROTEIN: 6.4 g/dL (ref 6.0–8.5)

## 2016-06-14 LAB — LIPID PANEL
Chol/HDL Ratio: 2.9 ratio units (ref 0.0–4.4)
Cholesterol, Total: 143 mg/dL (ref 100–199)
HDL: 50 mg/dL (ref >39)
LDL CALC: 70 mg/dL (ref 0–99)
TRIGLYCERIDES: 115 mg/dL (ref 0–149)
VLDL CHOLESTEROL CAL: 23 mg/dL (ref 5–40)

## 2016-06-14 LAB — THYROID PANEL WITH TSH
Free Thyroxine Index: 2.1 (ref 1.2–4.9)
T3 Uptake Ratio: 29 % (ref 24–39)
T4, Total: 7.4 ug/dL (ref 4.5–12.0)
TSH: 1.39 u[IU]/mL (ref 0.450–4.500)

## 2016-06-14 LAB — VITAMIN D 25 HYDROXY (VIT D DEFICIENCY, FRACTURES): VIT D 25 HYDROXY: 37.1 ng/mL (ref 30.0–100.0)

## 2016-06-16 LAB — IRON AND TIBC
IRON SATURATION: 6 % — AB (ref 15–55)
IRON: 20 ug/dL — AB (ref 27–139)
TIBC: 336 ug/dL (ref 250–450)
UIBC: 316 ug/dL (ref 118–369)

## 2016-06-16 LAB — FERRITIN: Ferritin: 9 ng/mL — ABNORMAL LOW (ref 15–150)

## 2016-06-16 LAB — SPECIMEN STATUS REPORT

## 2016-06-17 ENCOUNTER — Telehealth (INDEPENDENT_AMBULATORY_CARE_PROVIDER_SITE_OTHER): Payer: Self-pay | Admitting: Internal Medicine

## 2016-06-17 NOTE — Telephone Encounter (Signed)
Jamie with Dr. Evelene Croon office at [facility] called and stated that Dr. Laurance Flatten would like to speak to Dr. Laural Golden regarding this patient.  I did call Roselyn Reef back and told her that he would be in tomorrow and she stated that was fine to wait until tomorrow.  [phone]

## 2016-06-17 NOTE — Telephone Encounter (Signed)
A message was sent to Dr.Rehman.

## 2016-06-18 NOTE — Telephone Encounter (Signed)
Talked with Dr. Redge Gainer yesterday. Hemoglobin and iron studies are down. Will proceed with iron infusion. Feraheme 510 mg IV every weekly 2 doses. CBC and albumin  in 2 weeks prior to office visit. With me.

## 2016-06-20 NOTE — Telephone Encounter (Signed)
Prior Berkley Harvey has been completed.An order has been sent to [facility]. They will contact patient with the dates and times for the Feraheme infusions. We will contact the patient about her OV appointment with Dr.Rehman and the lab work that will be needed.

## 2016-06-21 ENCOUNTER — Other Ambulatory Visit (INDEPENDENT_AMBULATORY_CARE_PROVIDER_SITE_OTHER): Payer: Self-pay | Admitting: *Deleted

## 2016-06-21 ENCOUNTER — Encounter (INDEPENDENT_AMBULATORY_CARE_PROVIDER_SITE_OTHER): Payer: Self-pay | Admitting: *Deleted

## 2016-06-21 ENCOUNTER — Telehealth (INDEPENDENT_AMBULATORY_CARE_PROVIDER_SITE_OTHER): Payer: Self-pay | Admitting: *Deleted

## 2016-06-21 DIAGNOSIS — D509 Iron deficiency anemia, unspecified: Secondary | ICD-10-CM

## 2016-06-21 NOTE — Telephone Encounter (Signed)
Hope - Dr.Rehman said for [place] to bring patient in on Monday after 10 am and he will see her. I will order her lab work for 07/05/16,and send out a letter for her as a reminder for that.

## 2016-06-25 NOTE — Discharge Instructions (Signed)

## 2016-06-26 ENCOUNTER — Encounter (HOSPITAL_COMMUNITY): Payer: Self-pay

## 2016-06-26 ENCOUNTER — Encounter (HOSPITAL_COMMUNITY)
Admission: RE | Admit: 2016-06-26 | Discharge: 2016-06-26 | Disposition: A | Discharge status: Home / Self Care | Payer: Medicare Other | Source: Ambulatory Visit | Attending: Internal Medicine | Admitting: Internal Medicine

## 2016-06-26 DIAGNOSIS — E611 Iron deficiency: Secondary | ICD-10-CM | POA: Insufficient documentation

## 2016-06-26 MED ORDER — FERUMOXYTOL INJECTION 510 MG/17 ML
510.0000 mg | INTRAVENOUS | Status: DC
  Administered 2016-06-26: 510 mg via INTRAVENOUS
  Filled 2016-06-26: qty 17

## 2016-06-26 MED ORDER — SODIUM CHLORIDE 0.9 % IV SOLN
Freq: Once | INTRAVENOUS | Status: AC
  Administered 2016-06-26: 250 mL via INTRAVENOUS

## 2016-06-27 ENCOUNTER — Encounter (INDEPENDENT_AMBULATORY_CARE_PROVIDER_SITE_OTHER): Payer: Self-pay | Admitting: Internal Medicine

## 2016-06-27 NOTE — Telephone Encounter (Signed)
Patient was given an appointment for 07/08/16 at 10:30am and a letter was mailed to the patient.

## 2016-07-03 ENCOUNTER — Encounter (HOSPITAL_COMMUNITY)
Admission: RE | Admit: 2016-07-03 | Discharge: 2016-07-03 | Disposition: A | Discharge status: Home / Self Care | Payer: Medicare Other | Source: Ambulatory Visit | Attending: Internal Medicine | Admitting: Internal Medicine

## 2016-07-03 DIAGNOSIS — E611 Iron deficiency: Secondary | ICD-10-CM | POA: Diagnosis not present

## 2016-07-03 DIAGNOSIS — D509 Iron deficiency anemia, unspecified: Secondary | ICD-10-CM | POA: Diagnosis not present

## 2016-07-03 LAB — CBC
HCT: 30.7 % — ABNORMAL LOW (ref 35.0–45.0)
Hemoglobin: 9.4 g/dL — ABNORMAL LOW (ref 11.7–15.5)
MCH: 25.8 pg — ABNORMAL LOW (ref 27.0–33.0)
MCHC: 30.6 g/dL — ABNORMAL LOW (ref 32.0–36.0)
MCV: 84.3 fL (ref 80.0–100.0)
MPV: 11.1 fL (ref 7.5–12.5)
PLATELETS: 225 10*3/uL (ref 140–400)
RBC: 3.64 MIL/uL — ABNORMAL LOW (ref 3.80–5.10)
RDW: 18.4 % — AB (ref 11.0–15.0)
WBC: 6.3 10*3/uL (ref 3.8–10.8)

## 2016-07-03 MED ORDER — FERUMOXYTOL INJECTION 510 MG/17 ML
510.0000 mg | INTRAVENOUS | Status: DC
  Administered 2016-07-03: 510 mg via INTRAVENOUS
  Filled 2016-07-03: qty 17

## 2016-07-03 MED ORDER — SODIUM CHLORIDE 0.9 % IV SOLN
INTRAVENOUS | Status: DC
  Administered 2016-07-03: 13:00:00 via INTRAVENOUS

## 2016-07-04 LAB — ALBUMIN: Albumin: 3.7 g/dL (ref 3.6–5.1)

## 2016-07-08 ENCOUNTER — Ambulatory Visit (INDEPENDENT_AMBULATORY_CARE_PROVIDER_SITE_OTHER): Payer: Medicare Other | Admitting: Internal Medicine

## 2016-07-08 NOTE — Progress Notes (Signed)
The patient had an appointment for today at 10:30am, but her spouse accidentally canceled it when they got a call from the automated system to remind the patient of her appointment.  She is now coming in on Thursday (per Dr. Laural Golden), 07/11/16 at 4:15pm.  The patient was advised.

## 2016-07-11 ENCOUNTER — Ambulatory Visit (INDEPENDENT_AMBULATORY_CARE_PROVIDER_SITE_OTHER): Payer: Medicare Other | Admitting: Internal Medicine

## 2016-07-11 ENCOUNTER — Encounter (INDEPENDENT_AMBULATORY_CARE_PROVIDER_SITE_OTHER): Payer: Self-pay | Admitting: Internal Medicine

## 2016-07-11 VITALS — BP 132/70 | HR 71 | Temp 98.3°F | Resp 18 | Ht 67.0 in | Wt 118.4 lb

## 2016-07-11 DIAGNOSIS — D509 Iron deficiency anemia, unspecified: Secondary | ICD-10-CM

## 2016-07-11 DIAGNOSIS — R634 Abnormal weight loss: Secondary | ICD-10-CM

## 2016-07-11 DIAGNOSIS — R1084 Generalized abdominal pain: Secondary | ICD-10-CM

## 2016-07-11 DIAGNOSIS — R197 Diarrhea, unspecified: Secondary | ICD-10-CM

## 2016-07-11 DIAGNOSIS — K2100 Gastro-esophageal reflux disease with esophagitis, without bleeding: Secondary | ICD-10-CM

## 2016-07-11 MED ORDER — LOPERAMIDE HCL 2 MG PO CAPS: 2.0000 mg | ORAL_CAPSULE | Freq: Two times a day (BID) | ORAL | 2 refills | Status: DC

## 2016-07-11 MED ORDER — DEXLANSOPRAZOLE 60 MG PO CPDR: 60.0000 mg | DELAYED_RELEASE_CAPSULE | Freq: Every day | ORAL | 5 refills | Status: DC

## 2016-07-11 NOTE — Patient Instructions (Signed)
CTA abdomen to be scheduled next week. Hemoglobin and Hematocrit next week.

## 2016-07-11 NOTE — Progress Notes (Signed)
Presenting complaint;  Follow-up for heartburn nausea vomiting abdominal pain diarrhea and weight loss. Recent blood work revealed IDA  Database and Subjective:  Patient is 75 year old Caucasian female who is here for scheduled visit. Patient was hospitalized at [facility] for profound anemia and pneumonia back in March this year. She also had nausea vomiting anorexia weight loss. Her stool was guaiac-negative. Iron studies were normal. She received 2 units of PRBCs. EGD revealed erosive/ulcerative reflux esophagitis small sliding hiatal hernia gastric antral vascular ectasia without stigmata of bleeding and no evidence of peptic ulcer disease or duodenal mucosal abnormality. Patient was discharged on double dose PPI and metoclopramide. Patient was seen in the office on 03/07/2016 when she weighed 117 pounds. She only had lost 1 pound in the preceding 1 month. She is advised to take ranitidine at bedtime and sucralfate was discontinued. Patient was scheduled to return in 2 months but she could not for personal reasons. She states she is been under a lot of stress. Prior to her last visit she had lost 3 members of her family and her close friend in the preceding 4 weeks. Since then she has lost her son. Her son died on 2016-05-15. He had advanced cirrhosis and its complications.  She states she feels worse. She has daily heartburn and regurgitation despite taking medications. She is staying away from fatty or fried foods. She does not have a good appetite. 2 weeks ago she had intense nausea that she had to induce vomiting in order to get relief. She does not have hematemesis melena or frank rectal bleeding. She continues to complain of pain across her upper and lower abdomen. She feels eating results in regurgitation heartburn nausea and abdominal pain. She has not lost any weight since her last visit. She also complains of abdominal rumbling and diarrhea. On most days she has at least 6 stools. However he saw  him she passes small volume. She feels hemorrhoids have flared with diarrhea and she notices blood in the tissue. She feels weak. She has no energy. She continues to smoke cigarettes. She is smoking at least one pack per day. After she was discovered to have iron deficiency anemia she was given Feraheme 510 mg 2 weeks ago and another dose last week. She denies fever chills or night sweats.      Current Medications: Outpatient Encounter Prescriptions as of 07/11/2016  Medication Sig  . acetaminophen (TYLENOL) 500 MG tablet Take 500 mg by mouth every 6 (six) hours as needed for moderate pain or headache.   . ALPRAZolam (XANAX) 0.5 MG tablet TAKE  (1)  TABLET  THREE TIMES DAILY AS NEEDED.  . cholecalciferol (VITAMIN D) 1000 UNITS tablet Take 2,000-4,000 Units by mouth daily. Take 2000 units daily except take 4000 units on Saturday and Sunday.  . metoCLOPramide (REGLAN) 5 MG tablet TAKE 1 TABLET 4 TIMES A DAY BEFORE MEALS AND AT BEDTIME  . Multiple Vitamins-Minerals (CENTRUM SILVER PO) Take 1 tablet by mouth daily.  . pantoprazole (PROTONIX) 40 MG tablet TAKE 1 TABLET (40 MG TOTAL) BY MOUTH 2 (TWO) TIMES DAILY BEFORE A MEAL.  . ranitidine (ZANTAC) 300 MG tablet Take 300 mg by mouth as needed for heartburn.  . vitamin B-12 (CYANOCOBALAMIN) 1000 MCG tablet Take 1,000 mcg by mouth daily.  . [DISCONTINUED] meclizine (ANTIVERT) 50 MG tablet Take 1 tablet (50 mg total) by mouth 3 (three) times daily as needed. (Patient not taking: Reported on 07/11/2016)  . [DISCONTINUED] ranitidine (ZANTAC) 150 MG tablet Take 150  mg by mouth at bedtime as needed.   No facility-administered encounter medications on file as of 07/11/2016.      Objective: Blood pressure 132/70, pulse 71, temperature 98.3 F (36.8 C), temperature source Oral, resp. rate 18, height 5' 7"  (1.702 m), weight 118 lb 6.4 oz (53.7 kg). Patient is alert and in no acute distress. She is thin and appears to be chronically ill. Conjunctiva is  pink. Sclera is nonicteric Oropharyngeal mucosa is normal. No neck masses or thyromegaly noted. Cardiac exam with regular rhythm normal S1 and S2. No murmur or gallop noted. Lungs are clear to auscultation. Abdomen is symmetrical. Auscultation reveals normal bowel sounds and no bruit. On palpation abdomen is soft with mild generalized tenderness. No organomegaly or masses.  No LE edema or clubbing noted.  Labs/studies Results: Lab data from 06/13/2016  WBC 6.4, H&H 8.9 and 29.8 and platelet count 234K. MCV 84. BUN 14, creatinine 0.80  Glucose 114.  Bilirubin 0.2, AP 98, AST 14, ALT 8, total protein 6.4 and albumin 4.1.  Serum calcium 9.0.  TSH 1.390  Serum iron 20, TIBC 336 saturation 6%  Serum ferritin 9. It was 112 in March 2017.   Lab data from 07/03/2016  H&H 9.4 and 30.7   serum albumin 3.7.  Assessment:  #1. Nausea and vomiting. Patient has not responded to promotility agent. EGD back in March this year revealed no evidence of peptic ulcer disease or pyloric stenosis. She could have gastroparesis unresponsive to metoclopramide or she could have small bowel process beyond scope of EGD. Nausea and vomiting in light of other symptoms raises multiple possibilities such as gallbladder disease or intestinal ischemia given extensive atherosclerosis aorta and takeoff of the celiac trunk. #2. Erosive/ulcerative reflux esophagitis. She has not responded to current therapy. Need to rule out small bowel process which can result in secondary reflux esophagitis. #3. Anemia. Anemia was discovered back in March this year when she was hospitalized. Her stool was guaiac negative and iron studies were not consistent with iron deficiency anemia. She received 2 units of PRBCs then. Iron studies 4 weeks ago by Dr. Redge Gainer consistent with iron deficiency anemia. She is responding to parenteral iron. She has received 2 doses of Feraheme. She had colonoscopy in April 2016 revealing focal sigmoid colitis  possibly resolving ischemic sigmoid colon diverticulosis and hemorrhoids. Recent EGD revealed GAVE which may be the source of GI blood loss. She may also be losing blood from her esophagus. Need for further evaluation will depend on clinical course. #4. Abdominal pain. She is having upper as well as lower abdominal pain. She has history of IBS. However given other symptoms need to rule out mesenteric ischemia or abdominal angina. #5. Diarrhea. She has history of IBS. At one point she was constipated. Diarrhea could've been made worse with metoclopramide. If she does not respond to symptomatic therapy she will need further workup. #6. Weight loss. Weight loss appears to be primarily due to diminished oral intake. She has not lost any weight since her last visit 4 months ago.  Plan:  CT angiogram abdomen. She will have H&H next week. Discontinue pantoprazole and metoclopramide. Begin Dexilant Extina milligrams by mouth every morning. Six-day supply given along with prescription. Imodium OTC 2 mg by mouth before breakfast and lunch daily. Unless CT angios shows critical disease to visceral arteries will proceed with upper abdominal ultrasound as well as repeat EGD with duodenal biopsy and flexible sigmoidoscopy. Office visit in 8 weeks.

## 2016-07-12 ENCOUNTER — Other Ambulatory Visit (INDEPENDENT_AMBULATORY_CARE_PROVIDER_SITE_OTHER): Payer: Self-pay | Admitting: Internal Medicine

## 2016-07-12 DIAGNOSIS — D649 Anemia, unspecified: Secondary | ICD-10-CM

## 2016-07-12 DIAGNOSIS — K2210 Ulcer of esophagus without bleeding: Secondary | ICD-10-CM

## 2016-07-12 DIAGNOSIS — R112 Nausea with vomiting, unspecified: Secondary | ICD-10-CM

## 2016-07-12 DIAGNOSIS — R634 Abnormal weight loss: Secondary | ICD-10-CM

## 2016-07-12 DIAGNOSIS — R109 Unspecified abdominal pain: Secondary | ICD-10-CM

## 2016-07-15 ENCOUNTER — Encounter (INDEPENDENT_AMBULATORY_CARE_PROVIDER_SITE_OTHER): Payer: Self-pay | Admitting: Internal Medicine

## 2016-07-15 ENCOUNTER — Encounter (INDEPENDENT_AMBULATORY_CARE_PROVIDER_SITE_OTHER): Payer: Self-pay | Admitting: *Deleted

## 2016-07-15 ENCOUNTER — Other Ambulatory Visit (INDEPENDENT_AMBULATORY_CARE_PROVIDER_SITE_OTHER): Payer: Self-pay | Admitting: *Deleted

## 2016-07-15 DIAGNOSIS — D508 Other iron deficiency anemias: Secondary | ICD-10-CM

## 2016-07-19 ENCOUNTER — Ambulatory Visit (HOSPITAL_COMMUNITY)
Admission: RE | Admit: 2016-07-19 | Discharge: 2016-07-19 | Disposition: A | Discharge status: Home / Self Care | Payer: Medicare Other | Source: Ambulatory Visit | Attending: Internal Medicine | Admitting: Internal Medicine

## 2016-07-19 DIAGNOSIS — R109 Unspecified abdominal pain: Secondary | ICD-10-CM | POA: Insufficient documentation

## 2016-07-19 DIAGNOSIS — D649 Anemia, unspecified: Secondary | ICD-10-CM | POA: Insufficient documentation

## 2016-07-19 DIAGNOSIS — R634 Abnormal weight loss: Secondary | ICD-10-CM | POA: Diagnosis not present

## 2016-07-19 DIAGNOSIS — K2210 Ulcer of esophagus without bleeding: Secondary | ICD-10-CM

## 2016-07-19 DIAGNOSIS — I700 Atherosclerosis of aorta: Secondary | ICD-10-CM | POA: Diagnosis not present

## 2016-07-19 DIAGNOSIS — R112 Nausea with vomiting, unspecified: Secondary | ICD-10-CM | POA: Diagnosis not present

## 2016-07-19 DIAGNOSIS — K208 Other esophagitis: Secondary | ICD-10-CM | POA: Diagnosis not present

## 2016-07-19 MED ORDER — IOPAMIDOL (ISOVUE-370) INJECTION 76%
100.0000 mL | Freq: Once | INTRAVENOUS | Status: AC | PRN
  Administered 2016-07-19: 100 mL via INTRAVENOUS

## 2016-07-25 DIAGNOSIS — D508 Other iron deficiency anemias: Secondary | ICD-10-CM | POA: Diagnosis not present

## 2016-07-25 LAB — HEMOGLOBIN AND HEMATOCRIT, BLOOD
HCT: 38.7 % (ref 35.0–45.0)
HEMOGLOBIN: 12.1 g/dL (ref 11.7–15.5)

## 2016-07-29 ENCOUNTER — Other Ambulatory Visit: Payer: Self-pay | Admitting: Family Medicine

## 2016-07-30 ENCOUNTER — Ambulatory Visit (INDEPENDENT_AMBULATORY_CARE_PROVIDER_SITE_OTHER): Payer: Medicare Other | Admitting: Internal Medicine

## 2016-08-22 ENCOUNTER — Encounter (INDEPENDENT_AMBULATORY_CARE_PROVIDER_SITE_OTHER): Payer: Self-pay | Admitting: Internal Medicine

## 2016-08-22 ENCOUNTER — Ambulatory Visit (INDEPENDENT_AMBULATORY_CARE_PROVIDER_SITE_OTHER): Payer: Medicare Other | Admitting: Internal Medicine

## 2016-08-22 VITALS — BP 120/70 | HR 68 | Temp 97.8°F | Resp 18 | Ht 67.0 in | Wt 116.8 lb

## 2016-08-22 DIAGNOSIS — R112 Nausea with vomiting, unspecified: Secondary | ICD-10-CM | POA: Diagnosis not present

## 2016-08-22 DIAGNOSIS — R197 Diarrhea, unspecified: Secondary | ICD-10-CM

## 2016-08-22 DIAGNOSIS — R634 Abnormal weight loss: Secondary | ICD-10-CM

## 2016-08-22 DIAGNOSIS — K2100 Gastro-esophageal reflux disease with esophagitis, without bleeding: Secondary | ICD-10-CM

## 2016-08-22 MED ORDER — METOCLOPRAMIDE HCL 5 MG PO TABS: 5.0000 mg | ORAL_TABLET | Freq: Four times a day (QID) | ORAL | 2 refills | Status: DC

## 2016-08-22 MED ORDER — PANTOPRAZOLE SODIUM 40 MG PO TBEC: 40.0000 mg | DELAYED_RELEASE_TABLET | Freq: Two times a day (BID) | ORAL | 2 refills | Status: DC

## 2016-08-22 MED ORDER — POLYETHYLENE GLYCOL 3350 17 GM/SCOOP PO POWD: 8.5000 g | Freq: Every day | ORAL | 0 refills | Status: DC

## 2016-08-22 MED ORDER — LOPERAMIDE HCL 2 MG PO CAPS: 2.0000 mg | ORAL_CAPSULE | Freq: Every day | ORAL | Status: DC | PRN

## 2016-08-22 NOTE — Patient Instructions (Signed)
Physician will call with blood work when completed. UGI series with small bowel follow-through to be scheduled. Take polyethylene glycol MiraLAX every night. Can take Imodium OTC 2 mg after one or 2 bowel movements every morning. Continue her affect to quit cigarette smoking.

## 2016-08-22 NOTE — Progress Notes (Signed)
Presenting complaint;  Follow-up for GERD nausea vomiting anorexia and weight loss. Patient complains of diarrhea.  Database and Subjective:  Patient is 75 year old Caucasian female who is here for scheduled visit. She was last seen on 07/11/2016 when she weighed 118 pounds. Her recent history is as follows. Patient was hospitalized at [facility] for profound anemia and pneumonia back in March this year. She also had nausea vomiting anorexia weight loss. Her stool was guaiac-negative. Iron studies were normal. She received 2 units of PRBCs. EGD revealed erosive/ulcerative reflux esophagitis small sliding hiatal hernia gastric antral vascular ectasia without stigmata of bleeding and no evidence of peptic ulcer disease or duodenal mucosal abnormality. Patient was discharged on double dose PPI and metoclopramide. Patient was seen in the office on 03/07/2016 when she weighed 117 pounds. She only had lost 1 pound in the preceding 1 month. She is advised to take ranitidine at bedtime and sucralfate was discontinued. Her hemoglobin on 06/13/2016 was 8.9 and she received 2 doses of Feraheme with normalization of her hemoglobin.  Following her last visit on 07/11/2016 she had lab studies as well as CT angiogram abdomen and pelvis. Patient was advised to increase intake of fiber rich foods. She was switched to [place] which she stopped after a few days because of bloating. She was advised to go back and pantoprazole which she took until she ran out of prescription. She was also advised to use Imodium OTC in order to reduce stool frequency she has has taken a few doses and became constipated her or did not have a bowel movement for whole day.  Patient states she does not feel any better. She feels something really bad is wrong with her and we just have not pinpointed the source of her symptoms. She feels she is dying.she says her appetite is not good but she is trying to increase food intake as tolerated.  She  remains with frequent heartburn and regurgitation. She also has intermittent vomiting. She had 6 episodes of vomiting last week. She did not vomit blood. She complains of abdominal rumbling. She says every time she she has to run to the bathroom. She states she has diarrhea however she is passing fecal balls or hard stool. She never has sense of complete evacuation. She has not experienced melena or rectal bleeding. She complains of feeling weak and dizzy. She also complains of feeling very nervous and unable to sleep. She states she has not slept well in several months. She feels current dose of alprazolam is not helping at all.   She states she is no smoking 8 cigarettes a day. She has been smoking as many as 2 packs per day.  She denies abdominal pain fever chills or night sweats.   she says only thing she enjoys doing now is visit to her hairdresser.    Current Medications: Outpatient Encounter Prescriptions as of 08/22/2016  Medication Sig  . acetaminophen (TYLENOL) 500 MG tablet Take 500 mg by mouth every 6 (six) hours as needed for moderate pain or headache.   . ALPRAZolam (XANAX) 0.5 MG tablet TAKE  (1)  TABLET  THREE TIMES DAILY AS NEEDED.  . cholecalciferol (VITAMIN D) 1000 UNITS tablet Take 2,000-4,000 Units by mouth daily. Take 2000 units daily except take 4000 units on Saturday and Sunday.  . loperamide (IMODIUM) 2 MG capsule Take 1 capsule (2 mg total) by mouth 2 (two) times daily before a meal.  . metoCLOPramide (REGLAN) 5 MG tablet Take 5 mg by mouth 4 (  four) times daily.   . Multiple Vitamins-Minerals (CENTRUM SILVER PO) Take 1 tablet by mouth daily.  . ranitidine (ZANTAC) 300 MG tablet Take 300 mg by mouth as needed for heartburn.  . vitamin B-12 (CYANOCOBALAMIN) 1000 MCG tablet Take 1,000 mcg by mouth daily.  Marland Kitchen dexlansoprazole (DEXILANT) 60 MG capsule Take 1 capsule (60 mg total) by mouth daily. (Patient not taking: Reported on 08/22/2016)   No facility-administered encounter  medications on file as of 08/22/2016.      Objective: Blood pressure 120/70, pulse 68, temperature 97.8 F (36.6 C), temperature source Oral, resp. rate 18, height 5' 7"  (1.702 m), weight 116 lb 12.8 oz (53 kg).  Patient is alert and in no acute distress.  She appears to be chronically ill.  Conjunctiva is pink. Sclera is nonicteric Oropharyngeal mucosa is normal. No neck masses or thyromegaly noted. Cardiac exam with regular rhythm normal S1 and S2. No murmur or gallop noted. Lungs are clear to auscultation. Abdomen is full. Bowel sounds are normal. No bruits noted. On palpation abdomen is soft and nontender. Sigmoid colon is full of stool in left low quadrant of her abdomen.   No LE edema or clubbing noted.  Labs/studies Results: H&H was 12.1 and 38.7 on 07/25/2016.   CT angiogram abdomen and pelvis performed on 07/19/2016  Images reviewed with patient.  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 No evidence of colonic wall thickening. Moderate stool burden throughout the colon.    Assessment:  #1. Weight loss. Patient continues to lose weight although pace has slowed. Weight loss appears to be due to diminished caloric intake. She has undergone fairly extensive workup there is no evidence of occult malignancy. She does not have thyroid disease. Her symptoms are not suggestive of intestinal ischemia or malabsorption even though she has extensive atherosclerotic changes aorta and its branches. #2. GERD. She was documented to have erosive/ulcerative esophagitis back in March when she was admitted with pneumonia and profound anemia. Symptoms are poorly controlled raising underlying possibility of gastroparesis ease or other condition such as SMA given weight loss. She will be further evaluated with upper GI with small bowel follow-through and if it is negative will proceed with gastric emptying  study. #3. Nausea and vomiting. It remains to be seen if she has gastroparesis ease SMA syndrome or other small bowel disease to account for the symptoms. There is a remote possibility that nausea and vomiting could be secondary to stress disorder. Number for the conditions need to be ruled out first. #4. Defecation disorder/spurious diarrhea. Patient keeps complaining about diarrhea but she does not have diarrhea. She has increased frequency of defecation. Abdominal examination today reveals large amount of stool in sigmoid colon. She may actually benefit from taking polyethylene glycol. #5. History of iron deficiency anemia. She received 2 units of PRBCs in March 2017. She received 2 doses of Feraheme in August 2017. Last H&H was normal. Etiology of iron deficiency anemia felt to be impaired absorption and possibly loss from upper GI tract. She had colonoscopy last year without significant findings. #6. Stress disorder. Patient states current dose of alprazolam is not helping. She remains under a lot of stress. She is still struggling with loss of 3 family members and a close friend earlier this year. Will defer treatment changes to Dr. Morrie Sheldon.   Plan:  Patient will go back on pantoprazole at 40 mg by mouth twice a day. Resume metoclopramide  5 mg by mouth before meals and daily at bedtime. Polyethylene glycol 8.5 g by mouth daily at bedtime. Imodium OTC 2 mg every morning after she has 1 or 2 bowel movements. Patient will go to the lab for CBC, sedimentation rate and chemistry panel. Will also check a fasting cortisol level. Right upper quadrant ultrasound looking for cholelithiasis  Upper GI with small bowel follow-through to be scheduled next week. Once again patient reminded that she must quit cigarette smoking. We'll confer with Dr. Morrie Sheldon regarding patient's ongoing stress and whether she needs change in therapy. Office visit in 8 weeks or earlier if needed

## 2016-08-23 ENCOUNTER — Other Ambulatory Visit (INDEPENDENT_AMBULATORY_CARE_PROVIDER_SITE_OTHER): Payer: Self-pay | Admitting: Internal Medicine

## 2016-08-23 ENCOUNTER — Telehealth (INDEPENDENT_AMBULATORY_CARE_PROVIDER_SITE_OTHER): Payer: Self-pay | Admitting: Internal Medicine

## 2016-08-23 ENCOUNTER — Other Ambulatory Visit (INDEPENDENT_AMBULATORY_CARE_PROVIDER_SITE_OTHER): Payer: Self-pay | Admitting: *Deleted

## 2016-08-23 DIAGNOSIS — R197 Diarrhea, unspecified: Secondary | ICD-10-CM

## 2016-08-23 DIAGNOSIS — R634 Abnormal weight loss: Secondary | ICD-10-CM

## 2016-08-23 DIAGNOSIS — R112 Nausea with vomiting, unspecified: Secondary | ICD-10-CM

## 2016-08-23 MED ORDER — ESCITALOPRAM OXALATE 10 MG PO TABS: 10.0000 mg | ORAL_TABLET | Freq: Every day | ORAL | 5 refills | Status: DC

## 2016-08-23 NOTE — Telephone Encounter (Signed)
Since condition discussed with Dr. Redge Gainer. Patient to be started on citalopram 10 mg by mouth daily. Patient called and recommendations discussed with her.

## 2016-08-26 ENCOUNTER — Telehealth (INDEPENDENT_AMBULATORY_CARE_PROVIDER_SITE_OTHER): Payer: Self-pay | Admitting: *Deleted

## 2016-08-26 ENCOUNTER — Encounter (INDEPENDENT_AMBULATORY_CARE_PROVIDER_SITE_OTHER): Payer: Self-pay | Admitting: Internal Medicine

## 2016-08-26 NOTE — Telephone Encounter (Signed)
Patient called about her appointment in December and also her medication. We discussed and all the patient's questions were answered about the medication.  She states that she needs to have her appointment with Dr.Rehman changed , the one that is 11/05/2016. She says that she has appointments that week  About every day and that Tuesday is the day that she gets her hair done.  She was also advised that Dr.Rehman currently sees patient's on Tuesdays , she still wants this date changed.

## 2016-08-27 NOTE — Telephone Encounter (Signed)
I called the patient and she thought her appointment was 10/22/16 because that was the suggested time on her AVS.  She is fine with the 11/05/16 at 1:30pm appointment.  I told her a letter with this appointment information was mailed out to her yesterday.

## 2016-08-29 ENCOUNTER — Other Ambulatory Visit (HOSPITAL_COMMUNITY)
Admission: RE | Admit: 2016-08-29 | Discharge: 2016-08-29 | Disposition: A | Discharge status: Home / Self Care | Payer: Medicare Other | Source: Ambulatory Visit | Attending: Internal Medicine | Admitting: Internal Medicine

## 2016-08-29 ENCOUNTER — Ambulatory Visit (HOSPITAL_COMMUNITY)
Admission: RE | Admit: 2016-08-29 | Discharge: 2016-08-29 | Disposition: A | Discharge status: Home / Self Care | Payer: Medicare Other | Source: Ambulatory Visit | Attending: Internal Medicine | Admitting: Internal Medicine

## 2016-08-29 ENCOUNTER — Other Ambulatory Visit (INDEPENDENT_AMBULATORY_CARE_PROVIDER_SITE_OTHER): Payer: Self-pay | Admitting: Internal Medicine

## 2016-08-29 DIAGNOSIS — R634 Abnormal weight loss: Secondary | ICD-10-CM | POA: Insufficient documentation

## 2016-08-29 DIAGNOSIS — R112 Nausea with vomiting, unspecified: Secondary | ICD-10-CM | POA: Diagnosis present

## 2016-08-29 DIAGNOSIS — K7689 Other specified diseases of liver: Secondary | ICD-10-CM | POA: Insufficient documentation

## 2016-08-29 DIAGNOSIS — R197 Diarrhea, unspecified: Secondary | ICD-10-CM

## 2016-08-29 LAB — COMPREHENSIVE METABOLIC PANEL
ALBUMIN: 4 g/dL (ref 3.5–5.0)
ALK PHOS: 86 U/L (ref 38–126)
ALT: 13 U/L — ABNORMAL LOW (ref 14–54)
ANION GAP: 6 (ref 5–15)
AST: 17 U/L (ref 15–41)
BILIRUBIN TOTAL: 0.6 mg/dL (ref 0.3–1.2)
BUN: 11 mg/dL (ref 6–20)
CALCIUM: 9.5 mg/dL (ref 8.9–10.3)
CO2: 31 mmol/L (ref 22–32)
Chloride: 101 mmol/L (ref 101–111)
Creatinine, Ser: 0.73 mg/dL (ref 0.44–1.00)
GFR calc Af Amer: >60 mL/min (ref >60)
GLUCOSE: 102 mg/dL — AB (ref 65–99)
POTASSIUM: 4.3 mmol/L (ref 3.5–5.1)
Sodium: 138 mmol/L (ref 135–145)
TOTAL PROTEIN: 7 g/dL (ref 6.5–8.1)

## 2016-08-29 LAB — CBC
HCT: 40.3 % (ref 36.0–46.0)
Hemoglobin: 12.8 g/dL (ref 12.0–15.0)
MCH: 29.8 pg (ref 26.0–34.0)
MCHC: 31.8 g/dL (ref 30.0–36.0)
MCV: 93.7 fL (ref 78.0–100.0)
PLATELETS: 173 10*3/uL (ref 150–400)
RBC: 4.3 MIL/uL (ref 3.87–5.11)
RDW: 18.1 % — ABNORMAL HIGH (ref 11.5–15.5)
WBC: 7.1 10*3/uL (ref 4.0–10.5)

## 2016-08-29 LAB — SEDIMENTATION RATE: SED RATE: 14 mm/h (ref 0–22)

## 2016-09-23 ENCOUNTER — Other Ambulatory Visit: Payer: Self-pay | Admitting: Family Medicine

## 2016-09-24 NOTE — Telephone Encounter (Signed)
Medication called in to [facility]

## 2016-09-24 NOTE — Telephone Encounter (Signed)
Please call in xanax with 1 refills 

## 2016-10-03 ENCOUNTER — Ambulatory Visit (INDEPENDENT_AMBULATORY_CARE_PROVIDER_SITE_OTHER): Payer: Medicare Other

## 2016-10-03 DIAGNOSIS — Z23 Encounter for immunization: Secondary | ICD-10-CM | POA: Diagnosis not present

## 2016-10-23 ENCOUNTER — Ambulatory Visit (INDEPENDENT_AMBULATORY_CARE_PROVIDER_SITE_OTHER): Payer: Medicare Other | Admitting: Family Medicine

## 2016-10-23 ENCOUNTER — Encounter: Payer: Self-pay | Admitting: Family Medicine

## 2016-10-23 VITALS — BP 108/62 | HR 91 | Temp 97.5°F | Ht 67.0 in | Wt 118.0 lb

## 2016-10-23 DIAGNOSIS — K559 Vascular disorder of intestine, unspecified: Secondary | ICD-10-CM | POA: Diagnosis not present

## 2016-10-23 DIAGNOSIS — M4850XS Collapsed vertebra, not elsewhere classified, site unspecified, sequela of fracture: Secondary | ICD-10-CM

## 2016-10-23 DIAGNOSIS — R197 Diarrhea, unspecified: Secondary | ICD-10-CM | POA: Diagnosis not present

## 2016-10-23 DIAGNOSIS — K219 Gastro-esophageal reflux disease without esophagitis: Secondary | ICD-10-CM | POA: Diagnosis not present

## 2016-10-23 DIAGNOSIS — IMO0001 Reserved for inherently not codable concepts without codable children: Secondary | ICD-10-CM

## 2016-10-23 DIAGNOSIS — E559 Vitamin D deficiency, unspecified: Secondary | ICD-10-CM | POA: Diagnosis not present

## 2016-10-23 DIAGNOSIS — E7800 Pure hypercholesterolemia, unspecified: Secondary | ICD-10-CM

## 2016-10-23 MED ORDER — ALPRAZOLAM 0.5 MG PO TABS: ORAL_TABLET | ORAL | 4 refills | Status: DC

## 2016-10-23 NOTE — Progress Notes (Signed)
Subjective:    Patient ID: Misty [name], female    DOB: 10/12/1941, 75 y.o.   MRN: [id number]  HPI Pt here for follow up and management of chronic medical problems which includes hyperlipidemia and GERD. She is taking medication regularly.The patient today still complaining with some GI problems and is continued to be followed by Dr. Melony Overly. This basically is loose bowel movements. She is followed again by him on December 19. She's requesting a refill on the Xanax. She will get lab work today and we will call her with these results as soon as they become available. Her weight is stable at 118 pounds. Her vital signs are good. The patient comes to the visit today with her husband. She denies any chest pain or shortness of breath. She still has the problems as indicated with her loose bowel movements and reflux. She has an appointment with the gastroenterologist on December 19. She is in good spirits today with no specific complaints with chest pain or shortness of breath or trouble passing her water. She will get lab work today and we will call her with the results once they become available she is requesting a refill on her Xanax.   Patient Active Problem List   Diagnosis Date Noted  . Malnutrition of moderate degree 01/29/2016  . Symptomatic anemia 01/27/2016  . Generalized weakness 01/27/2016  . Abnormal chest x-ray 01/27/2016  . Tobacco use disorder 01/27/2016  . Colitis   . Hematochezia 02/26/2015  . Ischemic colitis ([place]) 02/26/2015  . Orthostatic hypotension 02/26/2015  . Non-traumatic compression fracture of T11 thoracic vertebra (HCC) 07/08/2014  . Hyperlipidemia 08/19/2013  . Fibrocystic breast disease 08/19/2013  . History of migraine headaches 08/19/2013  . GERD (gastroesophageal reflux disease) 04/07/2013  . Osteoporosis, postmenopausal 04/07/2013   Outpatient Encounter Prescriptions as of 10/23/2016  Medication Sig  . acetaminophen (TYLENOL) 500 MG tablet Take 500 mg by  mouth every 6 (six) hours as needed for moderate pain or headache.   . ALPRAZolam (XANAX) 0.5 MG tablet TAKE  (1)  TABLET  THREE TIMES DAILY AS NEEDED.  . cholecalciferol (VITAMIN D) 1000 UNITS tablet Take 2,000-4,000 Units by mouth daily. Take 2000 units daily except take 4000 units on Saturday and Sunday.  . escitalopram (LEXAPRO) 10 MG tablet Take 1 tablet (10 mg total) by mouth at bedtime.  Marland Kitchen loperamide (IMODIUM) 2 MG capsule Take 1 capsule (2 mg total) by mouth daily as needed for diarrhea or loose stools.  . metoCLOPramide (REGLAN) 5 MG tablet Take 1 tablet (5 mg total) by mouth 4 (four) times daily.  . Multiple Vitamins-Minerals (CENTRUM SILVER PO) Take 1 tablet by mouth daily.  . pantoprazole (PROTONIX) 40 MG tablet Take 1 tablet (40 mg total) by mouth 2 (two) times daily before a meal.  . polyethylene glycol powder (GLYCOLAX/MIRALAX) powder Take 8.5 g by mouth daily.  . ranitidine (ZANTAC) 300 MG tablet Take 300 mg by mouth as needed for heartburn.  . vitamin B-12 (CYANOCOBALAMIN) 1000 MCG tablet Take 1,000 mcg by mouth daily.  . [DISCONTINUED] ALPRAZolam (XANAX) 0.5 MG tablet TAKE  (1)  TABLET  THREE TIMES DAILY AS NEEDED.   No facility-administered encounter medications on file as of 10/23/2016.      Review of Systems  Constitutional: Negative.   HENT: Negative.   Eyes: Negative.   Respiratory: Negative.   Cardiovascular: Negative.   Gastrointestinal: Positive for nausea and vomiting (following Dr Laural Golden).  Endocrine: Negative.   Genitourinary: Negative.  Musculoskeletal: Negative.   Skin: Negative.   Allergic/Immunologic: Negative.   Neurological: Negative.   Hematological: Negative.   Psychiatric/Behavioral: Negative.        Objective:   Physical Exam  Constitutional: She is oriented to person, place, and time. She appears well-developed and well-nourished. No distress.  She is thin in appearance and most importantly her weight is stable. The patient is pleasant and  alert  HENT:  [name] Normocephalic and atraumatic.  Right Ear: External ear normal.  Left Ear: External ear normal.  Nose: Nose normal.  Mouth/Throat: Oropharynx is clear and moist. No oropharyngeal exudate.  Eyes: Conjunctivae and EOM are normal. Pupils are equal, round, and reactive to light. Right eye exhibits no discharge. Left eye exhibits no discharge. No scleral icterus.  Neck: Normal range of motion. Neck supple. No thyromegaly present.  Cardiovascular: Normal rate, regular rhythm and intact distal pulses.   No murmur heard. Heart has a regular rate and rhythm at 84/m  Pulmonary/Chest: Effort normal and breath sounds normal. No respiratory distress. She has no wheezes. She has no rales.  Clear anteriorly and posteriorly  Abdominal: Soft. Bowel sounds are normal. She exhibits no mass. There is no tenderness. There is no rebound and no guarding.  There is an area of fullness in the right area lower quadrant of the abdomen which most likely is intestinal fullness. She will show this to the gastroenterologist at her next visit if it is still there. He was no inguinal adenopathy. There is no generalized abdominal tenderness.  Musculoskeletal: Normal range of motion. She exhibits no edema.  Lymphadenopathy:    She has no cervical adenopathy.  Neurological: She is alert and oriented to person, place, and time. She has normal reflexes. No cranial nerve deficit.  Skin: Skin is warm and dry. No rash noted.  Psychiatric: She has a normal mood and affect. Her behavior is normal. Judgment and thought content normal.  The patient is in good spirits today despite all the issues with her intestinal tract. She and I have the utmost confidence with her gastroenterologist in no that he is doing everything he can to find out why some of her symptoms persist.  Nursing note and vitals reviewed.  BP 108/62 (BP Location: Left Arm)   Pulse 91   Temp 97.5 F (36.4 C) (Oral)   Ht _0  (1.702 m)   Wt 118  lb (53.5 kg)   BMI 18.48 kg/m         Assessment & Plan:  1. Vitamin D deficiency -Continue with current treatment pending results of lab work - VITAMIN D 25 Hydroxy (Vit-D Deficiency, Fractures)  2. Pure hypercholesterolemia -Continue with aggressive therapeutic lifestyle changes as much as possible which include diet and exercise - CMP14+EGFR - Lipid panel  3. Gastroesophageal reflux disease, esophagitis presence not specified -Continue with pantoprazole - CMP14+EGFR  4. Diarrhea, unspecified type -And 10 you with Imodium and follow-up with GI as planned  5. Ischemic colitis ([place]) -The patient has had a diagnosis in the past of some ischemic colitis. She also has a diagnosis of colitis with a call as being specifically uncertain. She should continue to follow-up with the gastroenterologist as planned for his suggestions  6. Compression fracture of vertebra, sequela -He has chronic back pain and has a history of compression fracture. She needs to try to walk and exercise regularly while the same time avoiding falling and not picking up anything heavy. Take Tylenol if needed for pain.  Meds ordered  this encounter  Medications  . ALPRAZolam (XANAX) 0.5 MG tablet    Sig: TAKE  (1)  TABLET  THREE TIMES DAILY AS NEEDED.    Dispense:  90 tablet    Refill:  4   Patient Instructions                       Medicare Annual Wellness Visit  [facility] and the medical providers at [facility] strive to bring you the best medical care.  In doing so we not only want to address your current medical conditions and concerns but also to detect new conditions early and prevent illness, disease and health-related problems.    Medicare offers a yearly Wellness Visit which allows our clinical staff to assess your need for preventative services including immunizations, lifestyle education, counseling to decrease risk of preventable diseases and screening for fall risk and  other medical concerns.    This visit is provided free of charge (no copay) for all Medicare recipients. The clinical pharmacists at [facility] have begun to conduct these Wellness Visits which will also include a thorough review of all your medications.    As you primary medical provider recommend that you make an appointment for your Annual Wellness Visit if you have not done so already this year.  You may set up this appointment before you leave today or you may call back ([phone]) and schedule an appointment.  Please make sure when you call that you mention that you are scheduling your Annual Wellness Visit with the clinical pharmacist so that the appointment may be made for the proper length of time.     Continue current medications. Continue good therapeutic lifestyle changes which include good diet and exercise. Fall precautions discussed with patient. If an FOBT was given today- please return it to our front desk. If you are over 61 years old - you may need Prevnar 46 or the adult Pneumonia vaccine.  **Flu shots are available--- please call and schedule a FLU-CLINIC appointment**  After your visit with [place] today you will receive a survey in the mail or online from [employer] regarding your care with [place]. Please take a moment to fill this out. Your feedback is very important to [place] as you can help [place] better understand your patient needs as well as improve your experience and satisfaction. WE CARE ABOUT YOU!!!   Continue to follow-up with gastroenterology and follow his recommendations Continue to drink plenty of fluids stay is active as possible while being careful not to put yourself at risk for falling  Arrie Senate MD

## 2016-10-23 NOTE — Patient Instructions (Addendum)
Medicare Annual Wellness Visit  [facility] and the medical providers at [facility] strive to bring you the best medical care.  In doing so we not only want to address your current medical conditions and concerns but also to detect new conditions early and prevent illness, disease and health-related problems.    Medicare offers a yearly Wellness Visit which allows our clinical staff to assess your need for preventative services including immunizations, lifestyle education, counseling to decrease risk of preventable diseases and screening for fall risk and other medical concerns.    This visit is provided free of charge (no copay) for all Medicare recipients. The clinical pharmacists at [facility] have begun to conduct these Wellness Visits which will also include a thorough review of all your medications.    As you primary medical provider recommend that you make an appointment for your Annual Wellness Visit if you have not done so already this year.  You may set up this appointment before you leave today or you may call back ([phone]) and schedule an appointment.  Please make sure when you call that you mention that you are scheduling your Annual Wellness Visit with the clinical pharmacist so that the appointment may be made for the proper length of time.     Continue current medications. Continue good therapeutic lifestyle changes which include good diet and exercise. Fall precautions discussed with patient. If an FOBT was given today- please return it to our front desk. If you are over 61 years old - you may need Prevnar 13 or the adult Pneumonia vaccine.  **Flu shots are available--- please call and schedule a FLU-CLINIC appointment**  After your visit with [place] today you will receive a survey in the mail or online from [employer] regarding your care with [place]. Please take a moment to fill this out. Your feedback is very  important to [place] as you can help [place] better understand your patient needs as well as improve your experience and satisfaction. WE CARE ABOUT YOU!!!   Continue to follow-up with gastroenterology and follow his recommendations Continue to drink plenty of fluids stay is active as possible while being careful not to put yourself at risk for falling

## 2016-10-24 LAB — CMP14+EGFR
ALBUMIN: 4.2 g/dL (ref 3.5–4.8)
ALK PHOS: 104 IU/L (ref 39–117)
ALT: 8 IU/L (ref 0–32)
AST: 15 IU/L (ref 0–40)
Albumin/Globulin Ratio: 1.9 (ref 1.2–2.2)
BUN / CREAT RATIO: 11 — AB (ref 12–28)
BUN: 9 mg/dL (ref 8–27)
CHLORIDE: 102 mmol/L (ref 96–106)
CO2: 27 mmol/L (ref 18–29)
Calcium: 9 mg/dL (ref 8.7–10.3)
Creatinine, Ser: 0.8 mg/dL (ref 0.57–1.00)
GFR calc Af Amer: 83 mL/min/{1.73_m2} (ref >59)
GFR calc non Af Amer: 72 mL/min/{1.73_m2} (ref >59)
Globulin, Total: 2.2 g/dL (ref 1.5–4.5)
Glucose: 100 mg/dL — ABNORMAL HIGH (ref 65–99)
POTASSIUM: 4.3 mmol/L (ref 3.5–5.2)
SODIUM: 143 mmol/L (ref 134–144)
Total Protein: 6.4 g/dL (ref 6.0–8.5)

## 2016-10-24 LAB — LIPID PANEL
CHOLESTEROL TOTAL: 146 mg/dL (ref 100–199)
Chol/HDL Ratio: 3 ratio units (ref 0.0–4.4)
HDL: 49 mg/dL (ref >39)
LDL Calculated: 68 mg/dL (ref 0–99)
Triglycerides: 147 mg/dL (ref 0–149)
VLDL CHOLESTEROL CAL: 29 mg/dL (ref 5–40)

## 2016-10-24 LAB — VITAMIN D 25 HYDROXY (VIT D DEFICIENCY, FRACTURES): Vit D, 25-Hydroxy: 42.4 ng/mL (ref 30.0–100.0)

## 2016-10-29 NOTE — Progress Notes (Signed)
Results noted. Hopefully she is feeling better. She has office visit next week.

## 2016-11-01 ENCOUNTER — Telehealth: Payer: Self-pay | Admitting: *Deleted

## 2016-11-01 NOTE — Telephone Encounter (Signed)
-----   Message from Chipper Herb, MD sent at 10/25/2016  9:30 PM EST ----- The blood sugar is slightly elevated at 100. The creatinine, the most important kidney function test is within normal limits. The electrolytes including potassium are good. All liver function tests are within normal limits. Cholesterol numbers with traditional lipid testing have a total cholesterol that is good at 146. Triglycerides are within normal limits at 147. The LDL C cholesterol is good at 68. The vitamin D level is good at 42.4 and she should continue with current treatment

## 2016-11-01 NOTE — Telephone Encounter (Signed)
Pt notified of results Verbalizes understanding 

## 2016-11-05 ENCOUNTER — Encounter (INDEPENDENT_AMBULATORY_CARE_PROVIDER_SITE_OTHER): Payer: Self-pay | Admitting: Internal Medicine

## 2016-11-05 ENCOUNTER — Ambulatory Visit (INDEPENDENT_AMBULATORY_CARE_PROVIDER_SITE_OTHER): Payer: Medicare Other | Admitting: Internal Medicine

## 2016-11-05 VITALS — BP 110/70 | HR 72 | Temp 98.7°F | Resp 18 | Ht 67.0 in | Wt 119.2 lb

## 2016-11-05 DIAGNOSIS — K2100 Gastro-esophageal reflux disease with esophagitis, without bleeding: Secondary | ICD-10-CM

## 2016-11-05 DIAGNOSIS — R112 Nausea with vomiting, unspecified: Secondary | ICD-10-CM

## 2016-11-05 DIAGNOSIS — R634 Abnormal weight loss: Secondary | ICD-10-CM

## 2016-11-05 MED ORDER — METOCLOPRAMIDE HCL 10 MG PO TABS: 10.0000 mg | ORAL_TABLET | Freq: Four times a day (QID) | ORAL | 1 refills | Status: DC

## 2016-11-05 NOTE — Progress Notes (Signed)
Presenting complaint;  Follow-up for GERD nausea vomiting and weight loss.  Subjective:  Misty [name] is 75 year old Caucasian female who is here for scheduled visit. She was last seen on 08/22/2016. She is having less problems with diarrhea and/or urgency. She has figured out how to take her Imodium in order to prevent diarrhea and urgency. She says if she has to leave house she'll take 1 pill the day before in it works very well. On most days she has 2-6 bowel movements per day. Most of her stools are hard balls. She denies melena or rectal bleeding. She continues to have spells of nausea and vomiting. She had an episode on Thanksgiving day. She complains of daily heartburn and at times she feels her throat is on fire. She is using Tums almost every day. She has frequent belching at night. She denies hematemesis. She says her appetite is not good but she is trying to eat. She has gained 3 pounds since her last visit. She states she decided to quit cigarette smoking one day and she was not able to sleep at night. Now she is smoking no more than 2-3 cigarettes per day.  Current Medications: Outpatient Encounter Prescriptions as of 11/05/2016  Medication Sig  . acetaminophen (TYLENOL) 500 MG tablet Take 500 mg by mouth every 6 (six) hours as needed for moderate pain or headache.   . ALPRAZolam (XANAX) 0.5 MG tablet TAKE  (1)  TABLET  THREE TIMES DAILY AS NEEDED.  . cholecalciferol (VITAMIN D) 1000 UNITS tablet Take 2,000-4,000 Units by mouth daily. Take 2000 units daily except take 4000 units on Saturday and Sunday.  . escitalopram (LEXAPRO) 10 MG tablet Take 1 tablet (10 mg total) by mouth at bedtime.  Marland Kitchen loperamide (IMODIUM) 2 MG capsule Take 1 capsule (2 mg total) by mouth daily as needed for diarrhea or loose stools.  . metoCLOPramide (REGLAN) 5 MG tablet Take 1 tablet (5 mg total) by mouth 4 (four) times daily.  . Multiple Vitamins-Minerals (CENTRUM SILVER PO) Take 1 tablet by mouth daily.  .  pantoprazole (PROTONIX) 40 MG tablet Take 1 tablet (40 mg total) by mouth 2 (two) times daily before a meal.  . polyethylene glycol powder (GLYCOLAX/MIRALAX) powder Take 8.5 g by mouth daily.  . ranitidine (ZANTAC) 300 MG tablet Take 300 mg by mouth as needed for heartburn.  . vitamin B-12 (CYANOCOBALAMIN) 1000 MCG tablet Take 1,000 mcg by mouth daily.   No facility-administered encounter medications on file as of 11/05/2016.      Objective: Blood pressure 110/70, pulse 72, temperature 98.7 F (37.1 C), temperature source Oral, resp. rate 18, height 5' 7"  (1.702 m), weight 119 lb 3.2 oz (54.1 kg). Patient is alert and in no acute distress. She is more talkative today and also smiling which she did not do one prior visits. Conjunctiva is pink. Sclera is nonicteric Oropharyngeal mucosa is normal. No neck masses or thyromegaly noted. Cardiac exam with regular rhythm normal S1 and S2. No murmur or gallop noted. Lungs are clear to auscultation. Abdomen is full in the lower half. Bowel sounds are normal. On palpation abdomen is soft with mild midepigastric tenderness. Stool palpable in sigmoid colon. No LE edema or clubbing noted.  Labs/studies Results: Lab data from 10/23/2016  WBC 7.1, H&H 12.8 and 40.3 and platelet count 173K.  Serum sodium 143, potassium 4.3, chloride 102, CO2 27, calcium 9.0  BUN 9 and creatinine 0.8 Glucose 100 Bilirubin less than 0.2, AP 104, AST 15, ALT 8 total  protein 6.4 and albumin 4.2.     Assessment:  #1. Ulcerative reflux esophagitis. Symptoms are poorly controlled. I'm concerned that she may have underlying gastroparesis. #2. Nausea and vomiting most likely secondary to gastroparesis. She is on low-dose metoclopramide without clinical benefit. #3. Weight loss secondary to diminished calorie intake. Nice to see that she has skin 3 pounds despite her symptoms. #4. Irritable bowel syndrome. She is able to control frequency and urgency with when necessary  loperamide. #5. History of iron deficiency anemia. H&H remains normal.  Plan:  Increase metoclopramide to 10 mg by mouth 30 minutes before each meal and at bedtime. Patient will call office with progress report next week. If she does not feel any better we will proceed with solid-phase gastric emptying study off medication. Patient is aware of potential side effects and if she has any she will stop the medicine and call office. Office visit in 2 months.

## 2016-11-05 NOTE — Patient Instructions (Signed)
Please call office with progress report next week. Call office if you experience side effects with higher dose of metoclopramide.

## 2017-01-14 ENCOUNTER — Encounter (INDEPENDENT_AMBULATORY_CARE_PROVIDER_SITE_OTHER): Payer: Self-pay

## 2017-01-14 ENCOUNTER — Ambulatory Visit (INDEPENDENT_AMBULATORY_CARE_PROVIDER_SITE_OTHER): Payer: Medicare Other | Admitting: Internal Medicine

## 2017-01-14 VITALS — BP 140/78 | HR 67 | Temp 98.1°F | Resp 18 | Ht 67.0 in | Wt 119.6 lb

## 2017-01-14 DIAGNOSIS — K2100 Gastro-esophageal reflux disease with esophagitis, without bleeding: Secondary | ICD-10-CM

## 2017-01-14 DIAGNOSIS — R634 Abnormal weight loss: Secondary | ICD-10-CM | POA: Diagnosis not present

## 2017-01-14 DIAGNOSIS — F439 Reaction to severe stress, unspecified: Secondary | ICD-10-CM | POA: Diagnosis not present

## 2017-01-14 DIAGNOSIS — R112 Nausea with vomiting, unspecified: Secondary | ICD-10-CM | POA: Diagnosis not present

## 2017-01-14 DIAGNOSIS — K588 Other irritable bowel syndrome: Secondary | ICD-10-CM

## 2017-01-14 NOTE — Progress Notes (Signed)
Presenting complaint;  Follow-up for GERD nausea vomiting irregular bowel movements and history of iron deficiency anemia.  Subjective:  Patient is 76 year old Caucasian female who is here for scheduled visit. She was last seen on 11/05/2016. She states she is feeling some better. Now she is having 4-5 stools per day. Some of her stools are tubular and long. She is not passing fecal balls anymore. She is also not having urgency like she was having before. She believe she is eating better than she has been. She does not eat much at night. If she does so she has vomiting. She says vomitus consists of large amount of liquid with small amount of food. She is having an average of one episode per week. Occasionally she may have 2 episodes per week. She is having less heartburn. She is not having any side effects with metoclopramide. She is taking Imodium no more than once a day. She does not take it every day. She stated great grandson was born in Christmas day and has been source of a lot of joy for her. She states her blood pressure used to be low and it is now on the high side.    Current Medications: Outpatient Encounter Prescriptions as of 01/14/2017  Medication Sig  . acetaminophen (TYLENOL) 500 MG tablet Take 500 mg by mouth every 6 (six) hours as needed for moderate pain or headache.   . ALPRAZolam (XANAX) 0.5 MG tablet TAKE  (1)  TABLET  THREE TIMES DAILY AS NEEDED.  . cholecalciferol (VITAMIN D) 1000 UNITS tablet Take 2,000-4,000 Units by mouth daily. Take 2000 units daily except take 4000 units on Saturday and Sunday.  . escitalopram (LEXAPRO) 10 MG tablet Take 1 tablet (10 mg total) by mouth at bedtime.  Marland Kitchen loperamide (IMODIUM) 2 MG capsule Take 1 capsule (2 mg total) by mouth daily as needed for diarrhea or loose stools.  . metoCLOPramide (REGLAN) 10 MG tablet Take 1 tablet (10 mg total) by mouth 4 (four) times daily.  . Multiple Vitamins-Minerals (CENTRUM SILVER PO) Take 1 tablet by mouth  daily.  . pantoprazole (PROTONIX) 40 MG tablet Take 1 tablet (40 mg total) by mouth 2 (two) times daily before a meal.  . polyethylene glycol powder (GLYCOLAX/MIRALAX) powder Take 8.5 g by mouth daily.  . ranitidine (ZANTAC) 300 MG tablet Take 300 mg by mouth as needed for heartburn.  . vitamin B-12 (CYANOCOBALAMIN) 1000 MCG tablet Take 1,000 mcg by mouth daily.   No facility-administered encounter medications on file as of 01/14/2017.      Objective: Blood pressure 140/78, pulse 67, temperature 98.1 F (36.7 C), temperature source Oral, resp. rate 18, height 5' 7"  (1.702 m), weight 119 lb 9.6 oz (54.3 kg). Patient is alert and in no acute distress. Conjunctiva is pink. Sclera is nonicteric Oropharyngeal mucosa is normal. No neck masses or thyromegaly noted. Cardiac exam with regular rhythm normal S1 and S2. No murmur or gallop noted. Lungs are clear to auscultation. Abdomen is symmetrical. Bowel sounds are normal. She has mid abdominal bruit. On palpation abdomen is soft. Stool palpable in sigmoid colon. It is not as much as before. No LE edema or clubbing noted. She does not have tremors.  Labs/studies Results: H&H was 12.8 and 40.3 on 08/29/2016.   Lab data from 10/23/2016. Serum sodium 143 potassium 4.3 chloride 102 CO2 27 Glucose 100, BUN 9 and creatinine 0.80 Bilirubin less than 0.2, AP 104, AST 15, ALT 8 total protein 6.4 and albumin of 4.2. Calcium  9.0. Vitamin D2 level 42.4   Assessment:  #1. Nausea and vomiting most likely secondary to gastroparesis. My other concern is SMA syndrome but GI study in October 2017 ruled out. Frequency of nausea and vomiting has decreased with metoclopramide. She is not having any side effects with metoclopramide.  #2. Erosive reflux esophagitis. Primary etiology may be underlying gastroparesis. She is doing better with combination of PPI  H2B and promotility agent.   #3. Weight loss. She has not gained any weight since her last visit of  11/05/2016. Weight loss appears to be due to diminished calorie intake. Her symptoms are not suggestive of malabsorption. CT angiogram failed to show large vessel disease. He does not have I will chemical markers to suggest malabsorption. Will continue to monitor.  #4. Stress disorder. She lost family members and close friends last year and she lost her best friend few months ago. She is on alprazolam and citalopram and appears to be doing better. #5. Irritable bowel syndrome. She is having normal stools more often. She is also having much less urgency.  #6. History of iron deficiency anemia. Hemoglobin in October 2017 was normal. She will have repeat blood work and she sees Dr. Laurance Flatten next month.  Plan:  Continue pantoprazole ranitidine and metoclopramide at current dose. Patient encouraged to increase physical activity as tolerated. Medication list updated. Will plan EGD if frequency of nausea and vomiting spells increase. Office visit in 3 months.

## 2017-01-14 NOTE — Patient Instructions (Signed)
Please call office if nausea and vomiting becomes more frequent.

## 2017-01-15 ENCOUNTER — Encounter (INDEPENDENT_AMBULATORY_CARE_PROVIDER_SITE_OTHER): Payer: Self-pay | Admitting: Internal Medicine

## 2017-01-17 ENCOUNTER — Encounter (INDEPENDENT_AMBULATORY_CARE_PROVIDER_SITE_OTHER): Payer: Self-pay | Admitting: Internal Medicine

## 2017-01-31 ENCOUNTER — Other Ambulatory Visit (INDEPENDENT_AMBULATORY_CARE_PROVIDER_SITE_OTHER): Payer: Self-pay | Admitting: Internal Medicine

## 2017-02-04 ENCOUNTER — Other Ambulatory Visit (INDEPENDENT_AMBULATORY_CARE_PROVIDER_SITE_OTHER): Payer: Self-pay | Admitting: Internal Medicine

## 2017-03-06 ENCOUNTER — Ambulatory Visit (INDEPENDENT_AMBULATORY_CARE_PROVIDER_SITE_OTHER): Payer: Medicare Other

## 2017-03-06 ENCOUNTER — Encounter (INDEPENDENT_AMBULATORY_CARE_PROVIDER_SITE_OTHER): Payer: Self-pay

## 2017-03-06 ENCOUNTER — Encounter: Payer: Self-pay | Admitting: Family Medicine

## 2017-03-06 ENCOUNTER — Ambulatory Visit (INDEPENDENT_AMBULATORY_CARE_PROVIDER_SITE_OTHER): Payer: Medicare Other | Admitting: Family Medicine

## 2017-03-06 VITALS — BP 122/74 | HR 84 | Temp 98.8°F | Ht 67.0 in | Wt 123.0 lb

## 2017-03-06 DIAGNOSIS — E7800 Pure hypercholesterolemia, unspecified: Secondary | ICD-10-CM | POA: Diagnosis not present

## 2017-03-06 DIAGNOSIS — E559 Vitamin D deficiency, unspecified: Secondary | ICD-10-CM | POA: Diagnosis not present

## 2017-03-06 DIAGNOSIS — M545 Low back pain: Secondary | ICD-10-CM | POA: Diagnosis not present

## 2017-03-06 DIAGNOSIS — D649 Anemia, unspecified: Secondary | ICD-10-CM

## 2017-03-06 DIAGNOSIS — K219 Gastro-esophageal reflux disease without esophagitis: Secondary | ICD-10-CM | POA: Diagnosis not present

## 2017-03-06 MED ORDER — TRAMADOL HCL 50 MG PO TABS: 50.0000 mg | ORAL_TABLET | Freq: Two times a day (BID) | ORAL | 0 refills | Status: DC | PRN

## 2017-03-06 MED ORDER — ALPRAZOLAM 0.5 MG PO TABS: ORAL_TABLET | ORAL | 4 refills | Status: DC

## 2017-03-06 NOTE — Progress Notes (Signed)
Subjective:    Patient ID: Misty [name], female    DOB: 03/29/1941, 76 y.o.   MRN: [id number]  HPI Pt here for follow up and management of chronic medical problems which includes hyperlipidemia and GERD. She is taking medication regularly.The patient is complaining a lot today with her back. She has had these problems in the past. He is a severe low back pain that has been going on for 3 weeks and radiates around to her abdomen. We will get an x-ray and moved from that point. She is requesting a refill on the Xanax. We do have a CT scan of the chest that was done in April 2017. This showed a pulmonary parenchymal pattern of fibrosis suggesting possible interstitial pneumonitis. She did have coronary artery calcification and there was a dilated appearance of the esophagus suggestive of scleroderma. Her gastroenterologist was made aware of this report but we will give a copy this report with her to take to see him when she sees him again. The patient denies any chest pain or shortness of breath. She indicates the back pain is so severe that it takes her breath away. She denies any trouble with nausea vomiting diarrhea blood in the stool or black tarry bowel movements. She has been taking some BCs and she understands she should not be taking that for pain because of the irritating nature this medicine has on the stomach. She denies any trouble with passing her water currently. She has had back surgeries in the past in [place] but is no longer able to get to [place]. She would like to see someone locally and needs to see them as soon as possible.     Patient Active Problem List   Diagnosis Date Noted  . Malnutrition of moderate degree 01/29/2016  . Symptomatic anemia 01/27/2016  . Generalized weakness 01/27/2016  . Abnormal chest x-ray 01/27/2016  . Tobacco use disorder 01/27/2016  . Colitis   . Hematochezia 02/26/2015  . Ischemic colitis ([place]) 02/26/2015  . Orthostatic hypotension  02/26/2015  . Non-traumatic compression fracture of T11 thoracic vertebra (HCC) 07/08/2014  . Hyperlipidemia 08/19/2013  . Fibrocystic breast disease 08/19/2013  . History of migraine headaches 08/19/2013  . GERD (gastroesophageal reflux disease) 04/07/2013  . Osteoporosis, postmenopausal 04/07/2013   Outpatient Encounter Prescriptions as of 03/06/2017  Medication Sig  . acetaminophen (TYLENOL) 500 MG tablet Take 500 mg by mouth every 6 (six) hours as needed for moderate pain or headache.   . ALPRAZolam (XANAX) 0.5 MG tablet TAKE  (1)  TABLET  THREE TIMES DAILY AS NEEDED.  . cholecalciferol (VITAMIN D) 1000 UNITS tablet Take 2,000-4,000 Units by mouth daily. Take 2000 units daily except take 4000 units on Saturday and Sunday.  . escitalopram (LEXAPRO) 10 MG tablet TAKE ONE TABLET AT BEDTIME  . loperamide (IMODIUM) 2 MG capsule Take 1 capsule (2 mg total) by mouth daily as needed for diarrhea or loose stools.  . metoCLOPramide (REGLAN) 10 MG tablet Take 1 tablet (10 mg total) by mouth 4 (four) times daily.  . Multiple Vitamins-Minerals (CENTRUM SILVER PO) Take 1 tablet by mouth daily.  . pantoprazole (PROTONIX) 40 MG tablet Take 1 tablet 2 (two) times daily before a meal.  . polyethylene glycol powder (GLYCOLAX/MIRALAX) powder Take 8.5 g by mouth daily.  . ranitidine (ZANTAC) 300 MG tablet Take 300 mg by mouth as needed for heartburn.  . vitamin B-12 (CYANOCOBALAMIN) 1000 MCG tablet Take 1,000 mcg by mouth daily.   No facility-administered encounter  medications on file as of 03/06/2017.      Review of Systems  Constitutional: Negative.   HENT: Negative.   Eyes: Negative.   Respiratory: Negative.   Cardiovascular: Negative.   Gastrointestinal: Negative.   Endocrine: Negative.   Genitourinary: Negative.   Musculoskeletal: Positive for back pain (low back pain - radiates to abdomen).  Skin: Negative.   Allergic/Immunologic: Negative.   Neurological: Negative.   Hematological:  Negative.   Psychiatric/Behavioral: Negative.        Objective:   Physical Exam  Constitutional: She is oriented to person, place, and time. She appears well-developed and well-nourished. She appears distressed.  HENT:  [name] Normocephalic and atraumatic.  Right Ear: External ear normal.  Left Ear: External ear normal.  Nose: Nose normal.  Mouth/Throat: Oropharynx is clear and moist. No oropharyngeal exudate.  Eyes: Conjunctivae and EOM are normal. Pupils are equal, round, and reactive to light. Right eye exhibits no discharge. Left eye exhibits no discharge. No scleral icterus.  Neck: Normal range of motion. Neck supple. No thyromegaly present.  Cardiovascular: Normal rate, regular rhythm and normal heart sounds.   No murmur heard. The heart is regular at 84/m  Pulmonary/Chest: Effort normal and breath sounds normal. No respiratory distress. She has no wheezes. She has no rales.  Clear anteriorly and posteriorly  Abdominal: Soft. Bowel sounds are normal. She exhibits no mass. There is no tenderness. There is no rebound and no guarding.  Musculoskeletal: She exhibits tenderness. She exhibits no edema.  The patient groans with any kind of movement and is very tender to palpation over the spine and the paravertebral muscles in the low back. There was no rash apparent.  Lymphadenopathy:    She has no cervical adenopathy.  Neurological: She is alert and oriented to person, place, and time. She has normal reflexes. No cranial nerve deficit.  Skin: Skin is warm and dry. No rash noted.  Psychiatric: She has a normal mood and affect. Her behavior is normal. Judgment and thought content normal.  Nursing note and vitals reviewed.  BP 122/74 (BP Location: Left Arm)   Pulse 84   Temp 98.8 F (37.1 C) (Oral)   Ht _0  (1.702 m)   Wt 123 lb (55.8 kg)   BMI 19.26 kg/m         Assessment & Plan:  1. Vitamin D deficiency -Continue with current treatment pending results of lab work -  CBC with Differential/Platelet - VITAMIN D 25 Hydroxy (Vit-D Deficiency, Fractures)  2. Pure hypercholesterolemia -Continue with current treatment pending results of lab work - BMP8+EGFR - CBC with Differential/Platelet - Lipid panel  3. Gastroesophageal reflux disease, esophagitis presence not specified -The patient is not complaining with this today. She is taking BC and I reminded her that this could irritate her stomach and she said that she had a take something. - CBC with Differential/Platelet - Hepatic function panel  4. Low back pain, unspecified back pain laterality, unspecified chronicity, with sciatica presence unspecified -Tramadol 50 mg 1 twice daily if needed for severe pain #30 and no refills - CBC with Differential/Platelet - DG Lumbar Spine 2-3 Views; Future - Ambulatory referral to neurosurgery  Meds ordered this encounter  Medications  . traMADol (ULTRAM) 50 MG tablet    Sig: Take 1 tablet (50 mg total) by mouth every 12 (twelve) hours as needed.    Dispense:  30 tablet    Refill:  0  . ALPRAZolam (XANAX) 0.5 MG tablet    Sig:  TAKE  (1)  TABLET  THREE TIMES DAILY AS NEEDED.    Dispense:  90 tablet    Refill:  4   Patient Instructions                       Medicare Annual Wellness Visit  [facility] and the medical providers at [facility] strive to bring you the best medical care.  In doing so we not only want to address your current medical conditions and concerns but also to detect new conditions early and prevent illness, disease and health-related problems.    Medicare offers a yearly Wellness Visit which allows our clinical staff to assess your need for preventative services including immunizations, lifestyle education, counseling to decrease risk of preventable diseases and screening for fall risk and other medical concerns.    This visit is provided free of charge (no copay) for all Medicare recipients. The clinical pharmacists at  [facility] have begun to conduct these Wellness Visits which will also include a thorough review of all your medications.    As you primary medical provider recommend that you make an appointment for your Annual Wellness Visit if you have not done so already this year.  You may set up this appointment before you leave today or you may call back ([phone]) and schedule an appointment.  Please make sure when you call that you mention that you are scheduling your Annual Wellness Visit with the clinical pharmacist so that the appointment may be made for the proper length of time.     Continue current medications. Continue good therapeutic lifestyle changes which include good diet and exercise. Fall precautions discussed with patient. If an FOBT was given today- please return it to our front desk. If you are over 76 years old - you may need Prevnar 62 or the adult Pneumonia vaccine.  **Flu shots are available--- please call and schedule a FLU-CLINIC appointment**  After your visit with [place] today you will receive a survey in the mail or online from [employer] regarding your care with [place]. Please take a moment to fill this out. Your feedback is very important to [place] as you can help [place] better understand your patient needs as well as improve your experience and satisfaction. WE CARE ABOUT YOU!!!   Please return the FOBT We will arrange for an urgent visit with the neurosurgeon as patient has requested Take tramadol if needed for severe pain and avoid any medicine with anti-inflammatory medicines or aspirin because of GI problems from the past.    Arrie Senate MD   .

## 2017-03-06 NOTE — Patient Instructions (Addendum)
Medicare Annual Wellness Visit  [facility] and the medical providers at [facility] strive to bring you the best medical care.  In doing so we not only want to address your current medical conditions and concerns but also to detect new conditions early and prevent illness, disease and health-related problems.    Medicare offers a yearly Wellness Visit which allows our clinical staff to assess your need for preventative services including immunizations, lifestyle education, counseling to decrease risk of preventable diseases and screening for fall risk and other medical concerns.    This visit is provided free of charge (no copay) for all Medicare recipients. The clinical pharmacists at [facility] have begun to conduct these Wellness Visits which will also include a thorough review of all your medications.    As you primary medical provider recommend that you make an appointment for your Annual Wellness Visit if you have not done so already this year.  You may set up this appointment before you leave today or you may call back ([phone]) and schedule an appointment.  Please make sure when you call that you mention that you are scheduling your Annual Wellness Visit with the clinical pharmacist so that the appointment may be made for the proper length of time.     Continue current medications. Continue good therapeutic lifestyle changes which include good diet and exercise. Fall precautions discussed with patient. If an FOBT was given today- please return it to our front desk. If you are over 66 years old - you may need Prevnar 19 or the adult Pneumonia vaccine.  **Flu shots are available--- please call and schedule a FLU-CLINIC appointment**  After your visit with [place] today you will receive a survey in the mail or online from [employer] regarding your care with [place]. Please take a moment to fill this out. Your feedback is very  important to [place] as you can help [place] better understand your patient needs as well as improve your experience and satisfaction. WE CARE ABOUT YOU!!!   Please return the FOBT We will arrange for an urgent visit with the neurosurgeon as patient has requested Take tramadol if needed for severe pain and avoid any medicine with anti-inflammatory medicines or aspirin because of GI problems from the past.

## 2017-03-06 NOTE — Addendum Note (Signed)
Addended by: Magdalene River on: 03/06/2017 12:33 PM   Modules accepted: Orders

## 2017-03-06 NOTE — Addendum Note (Signed)
Addended by: Zannie Cove on: 03/06/2017 12:04 PM   Modules accepted: Orders

## 2017-03-07 ENCOUNTER — Ambulatory Visit (HOSPITAL_COMMUNITY)
Admission: RE | Admit: 2017-03-07 | Discharge: 2017-03-07 | Disposition: A | Discharge status: Home / Self Care | Payer: Medicare Other | Source: Ambulatory Visit | Attending: Family Medicine | Admitting: Family Medicine

## 2017-03-07 DIAGNOSIS — M545 Low back pain: Secondary | ICD-10-CM

## 2017-03-07 DIAGNOSIS — M4856XA Collapsed vertebra, not elsewhere classified, lumbar region, initial encounter for fracture: Secondary | ICD-10-CM | POA: Diagnosis not present

## 2017-03-07 DIAGNOSIS — M48061 Spinal stenosis, lumbar region without neurogenic claudication: Secondary | ICD-10-CM | POA: Insufficient documentation

## 2017-03-07 DIAGNOSIS — M5126 Other intervertebral disc displacement, lumbar region: Secondary | ICD-10-CM | POA: Insufficient documentation

## 2017-03-07 LAB — LIPID PANEL
CHOLESTEROL TOTAL: 129 mg/dL (ref 100–199)
Chol/HDL Ratio: 2.3 ratio (ref 0.0–4.4)
HDL: 57 mg/dL (ref >39)
LDL Calculated: 56 mg/dL (ref 0–99)
TRIGLYCERIDES: 80 mg/dL (ref 0–149)
VLDL Cholesterol Cal: 16 mg/dL (ref 5–40)

## 2017-03-07 LAB — CBC WITH DIFFERENTIAL/PLATELET
BASOS: 1 %
Basophils Absolute: 0 10*3/uL (ref 0.0–0.2)
EOS (ABSOLUTE): 0.4 10*3/uL (ref 0.0–0.4)
EOS: 5 %
HEMATOCRIT: 27.5 % — AB (ref 34.0–46.6)
Hemoglobin: 8.3 g/dL — CL (ref 11.1–15.9)
Immature Grans (Abs): 0 10*3/uL (ref 0.0–0.1)
Immature Granulocytes: 0 %
LYMPHS ABS: 1.1 10*3/uL (ref 0.7–3.1)
Lymphs: 16 %
MCH: 24.7 pg — AB (ref 26.6–33.0)
MCHC: 30.2 g/dL — AB (ref 31.5–35.7)
MCV: 82 fL (ref 79–97)
MONOS ABS: 0.6 10*3/uL (ref 0.1–0.9)
Monocytes: 8 %
Neutrophils Absolute: 4.6 10*3/uL (ref 1.4–7.0)
Neutrophils: 70 %
Platelets: 245 10*3/uL (ref 150–379)
RBC: 3.36 x10E6/uL — ABNORMAL LOW (ref 3.77–5.28)
RDW: 15.4 % (ref 12.3–15.4)
WBC: 6.7 10*3/uL (ref 3.4–10.8)

## 2017-03-07 LAB — HEPATIC FUNCTION PANEL
ALT: 5 IU/L (ref 0–32)
AST: 10 IU/L (ref 0–40)
Albumin: 3.9 g/dL (ref 3.5–4.8)
Alkaline Phosphatase: 117 IU/L (ref 39–117)
Bilirubin Total: <0.2 mg/dL (ref 0.0–1.2)
Bilirubin, Direct: 0.06 mg/dL (ref 0.00–0.40)
Total Protein: 6.5 g/dL (ref 6.0–8.5)

## 2017-03-07 LAB — VITAMIN D 25 HYDROXY (VIT D DEFICIENCY, FRACTURES): VIT D 25 HYDROXY: 48.2 ng/mL (ref 30.0–100.0)

## 2017-03-07 LAB — BMP8+EGFR
BUN / CREAT RATIO: 17 (ref 12–28)
BUN: 12 mg/dL (ref 8–27)
CO2: 25 mmol/L (ref 18–29)
CREATININE: 0.71 mg/dL (ref 0.57–1.00)
Calcium: 9.1 mg/dL (ref 8.7–10.3)
Chloride: 102 mmol/L (ref 96–106)
GFR calc non Af Amer: 84 mL/min/{1.73_m2} (ref >59)
GFR, EST AFRICAN AMERICAN: 96 mL/min/{1.73_m2} (ref >59)
GLUCOSE: 105 mg/dL — AB (ref 65–99)
Potassium: 3.9 mmol/L (ref 3.5–5.2)
Sodium: 143 mmol/L (ref 134–144)

## 2017-03-07 MED ORDER — INTEGRA PLUS PO CAPS: ORAL_CAPSULE | ORAL | 1 refills | Status: DC

## 2017-03-07 NOTE — Addendum Note (Signed)
Addended by: Tamera Punt on: 03/07/2017 11:42 AM   Modules accepted: Orders

## 2017-03-12 DIAGNOSIS — S32010A Wedge compression fracture of first lumbar vertebra, initial encounter for closed fracture: Secondary | ICD-10-CM | POA: Diagnosis not present

## 2017-03-18 DIAGNOSIS — F419 Anxiety disorder, unspecified: Secondary | ICD-10-CM | POA: Diagnosis not present

## 2017-03-18 DIAGNOSIS — S32010A Wedge compression fracture of first lumbar vertebra, initial encounter for closed fracture: Secondary | ICD-10-CM | POA: Diagnosis not present

## 2017-03-18 DIAGNOSIS — M5136 Other intervertebral disc degeneration, lumbar region: Secondary | ICD-10-CM | POA: Diagnosis not present

## 2017-03-18 DIAGNOSIS — Z886 Allergy status to analgesic agent status: Secondary | ICD-10-CM | POA: Diagnosis not present

## 2017-03-18 DIAGNOSIS — K219 Gastro-esophageal reflux disease without esophagitis: Secondary | ICD-10-CM | POA: Diagnosis not present

## 2017-03-18 DIAGNOSIS — R112 Nausea with vomiting, unspecified: Secondary | ICD-10-CM | POA: Diagnosis not present

## 2017-03-18 DIAGNOSIS — Z79899 Other long term (current) drug therapy: Secondary | ICD-10-CM | POA: Diagnosis not present

## 2017-03-18 DIAGNOSIS — F17210 Nicotine dependence, cigarettes, uncomplicated: Secondary | ICD-10-CM | POA: Diagnosis not present

## 2017-03-20 DIAGNOSIS — Z79899 Other long term (current) drug therapy: Secondary | ICD-10-CM | POA: Diagnosis not present

## 2017-03-20 DIAGNOSIS — F419 Anxiety disorder, unspecified: Secondary | ICD-10-CM | POA: Diagnosis not present

## 2017-03-20 DIAGNOSIS — K219 Gastro-esophageal reflux disease without esophagitis: Secondary | ICD-10-CM | POA: Diagnosis not present

## 2017-03-20 DIAGNOSIS — Z886 Allergy status to analgesic agent status: Secondary | ICD-10-CM | POA: Diagnosis not present

## 2017-03-20 DIAGNOSIS — M5136 Other intervertebral disc degeneration, lumbar region: Secondary | ICD-10-CM | POA: Diagnosis not present

## 2017-03-20 DIAGNOSIS — S32000A Wedge compression fracture of unspecified lumbar vertebra, initial encounter for closed fracture: Secondary | ICD-10-CM | POA: Diagnosis not present

## 2017-03-20 DIAGNOSIS — M4856XA Collapsed vertebra, not elsewhere classified, lumbar region, initial encounter for fracture: Secondary | ICD-10-CM | POA: Diagnosis not present

## 2017-03-20 DIAGNOSIS — X58XXXA Exposure to other specified factors, initial encounter: Secondary | ICD-10-CM | POA: Diagnosis not present

## 2017-03-20 DIAGNOSIS — F17210 Nicotine dependence, cigarettes, uncomplicated: Secondary | ICD-10-CM | POA: Diagnosis not present

## 2017-03-20 DIAGNOSIS — S32010A Wedge compression fracture of first lumbar vertebra, initial encounter for closed fracture: Secondary | ICD-10-CM | POA: Diagnosis not present

## 2017-03-20 DIAGNOSIS — R112 Nausea with vomiting, unspecified: Secondary | ICD-10-CM | POA: Diagnosis not present

## 2017-03-21 DIAGNOSIS — R112 Nausea with vomiting, unspecified: Secondary | ICD-10-CM | POA: Diagnosis not present

## 2017-03-21 DIAGNOSIS — S32010A Wedge compression fracture of first lumbar vertebra, initial encounter for closed fracture: Secondary | ICD-10-CM | POA: Diagnosis not present

## 2017-03-21 DIAGNOSIS — F17210 Nicotine dependence, cigarettes, uncomplicated: Secondary | ICD-10-CM | POA: Diagnosis not present

## 2017-03-21 DIAGNOSIS — Z79899 Other long term (current) drug therapy: Secondary | ICD-10-CM | POA: Diagnosis not present

## 2017-03-21 DIAGNOSIS — F419 Anxiety disorder, unspecified: Secondary | ICD-10-CM | POA: Diagnosis not present

## 2017-03-21 DIAGNOSIS — K219 Gastro-esophageal reflux disease without esophagitis: Secondary | ICD-10-CM | POA: Diagnosis not present

## 2017-03-28 DIAGNOSIS — S32010A Wedge compression fracture of first lumbar vertebra, initial encounter for closed fracture: Secondary | ICD-10-CM | POA: Diagnosis not present

## 2017-03-28 DIAGNOSIS — Z9889 Other specified postprocedural states: Secondary | ICD-10-CM | POA: Diagnosis not present

## 2017-03-28 DIAGNOSIS — M47816 Spondylosis without myelopathy or radiculopathy, lumbar region: Secondary | ICD-10-CM | POA: Diagnosis not present

## 2017-04-15 ENCOUNTER — Ambulatory Visit (INDEPENDENT_AMBULATORY_CARE_PROVIDER_SITE_OTHER): Payer: Medicare Other | Admitting: Internal Medicine

## 2017-04-22 ENCOUNTER — Encounter (INDEPENDENT_AMBULATORY_CARE_PROVIDER_SITE_OTHER): Payer: Self-pay

## 2017-04-22 ENCOUNTER — Encounter (INDEPENDENT_AMBULATORY_CARE_PROVIDER_SITE_OTHER): Payer: Self-pay | Admitting: Internal Medicine

## 2017-04-22 ENCOUNTER — Ambulatory Visit (INDEPENDENT_AMBULATORY_CARE_PROVIDER_SITE_OTHER): Payer: Medicare Other | Admitting: Internal Medicine

## 2017-04-22 VITALS — BP 120/70 | HR 76 | Temp 97.7°F | Resp 18 | Ht 67.0 in | Wt 116.5 lb

## 2017-04-22 DIAGNOSIS — R634 Abnormal weight loss: Secondary | ICD-10-CM

## 2017-04-22 DIAGNOSIS — F3289 Other specified depressive episodes: Secondary | ICD-10-CM | POA: Diagnosis not present

## 2017-04-22 DIAGNOSIS — R112 Nausea with vomiting, unspecified: Secondary | ICD-10-CM | POA: Diagnosis not present

## 2017-04-22 DIAGNOSIS — K2100 Gastro-esophageal reflux disease with esophagitis, without bleeding: Secondary | ICD-10-CM

## 2017-04-22 DIAGNOSIS — D508 Other iron deficiency anemias: Secondary | ICD-10-CM | POA: Diagnosis not present

## 2017-04-22 LAB — CBC
HCT: 36.9 % (ref 35.0–45.0)
Hemoglobin: 11.5 g/dL — ABNORMAL LOW (ref 11.7–15.5)
MCH: 26.6 pg — ABNORMAL LOW (ref 27.0–33.0)
MCHC: 31.2 g/dL — AB (ref 32.0–36.0)
MCV: 85.4 fL (ref 80.0–100.0)
MPV: 11.2 fL (ref 7.5–12.5)
PLATELETS: 203 10*3/uL (ref 140–400)
RBC: 4.32 MIL/uL (ref 3.80–5.10)
RDW: 19.8 % — AB (ref 11.0–15.0)
WBC: 6.8 10*3/uL (ref 3.8–10.8)

## 2017-04-22 MED ORDER — ESCITALOPRAM OXALATE 10 MG PO TABS: 20.0000 mg | ORAL_TABLET | Freq: Every day | ORAL | 5 refills | Status: DC

## 2017-04-22 MED ORDER — INTEGRA PLUS PO CAPS: ORAL_CAPSULE | ORAL | 5 refills | Status: DC

## 2017-04-22 MED ORDER — INTEGRA PLUS PO CAPS: 1.0000 | ORAL_CAPSULE | Freq: Every day | ORAL | 5 refills | Status: DC

## 2017-04-22 NOTE — Patient Instructions (Signed)
Physician will call with results of blood tests when completed.

## 2017-04-22 NOTE — Progress Notes (Addendum)
Presenting complaint;  Follow-up for multiple problems.  Subjective:  Patient is 76 year old Caucasian female who is here for scheduled visit. She was last seen on 01/14/2017 when she weighed 119 pounds. She states she gained weight to 124 pounds but she lost several pounds following back surgery on 03/20/2017. She says her hemoglobin prior to surgery had dropped to low 8. She states she was having intractable pain and took ibuprofen daily. She tells me she is not taking NSAIDs anymore. She does not recall if she had melena or rectal bleeding. She has taking iron pill daily. She continues to complain of frequent heartburn if not daily. She has intermittent nausea and vomiting. She says she vomits once or twice a week but denies hematemesis. She says her abdomen rumbles all the time. She generally has 2-3 stools per day. Most of her stools are like balls. She is not having urgency and 10 or 12 stools light she was before. She does not take Imodium every day. She continues to feel tired and that she does not have any energy. Still feels bad when she wakes up every morning.   Current Medications: Outpatient Encounter Prescriptions as of 04/22/2017  Medication Sig  . ALPRAZolam (XANAX) 0.5 MG tablet TAKE  (1)  TABLET  THREE TIMES DAILY AS NEEDED.  . cholecalciferol (VITAMIN D) 1000 UNITS tablet Take 2,000-4,000 Units by mouth daily. Take 2000 units daily except take 4000 units on Saturday and Sunday.  . escitalopram (LEXAPRO) 10 MG tablet TAKE ONE TABLET AT BEDTIME  . FeFum-FePoly-FA-B Cmp-C-Biot (INTEGRA PLUS) CAPS Take 1 tablet daily  . HYDROcodone-acetaminophen (NORCO/VICODIN) 5-325 MG tablet Take 1 tablet by mouth every 6 (six) hours as needed.   . loperamide (IMODIUM) 2 MG capsule Take 1 capsule (2 mg total) by mouth daily as needed for diarrhea or loose stools.  . metoCLOPramide (REGLAN) 10 MG tablet Take 1 tablet (10 mg total) by mouth 4 (four) times daily.  . Multiple Vitamins-Minerals (CENTRUM  SILVER PO) Take 1 tablet by mouth daily.  . pantoprazole (PROTONIX) 40 MG tablet Take 1 tablet 2 (two) times daily before a meal.  . polyethylene glycol powder (GLYCOLAX/MIRALAX) powder Take 8.5 g by mouth daily.  . ranitidine (ZANTAC) 300 MG tablet Take 300 mg by mouth as needed for heartburn.  . vitamin B-12 (CYANOCOBALAMIN) 1000 MCG tablet Take 1,000 mcg by mouth daily.  Marland Kitchen acetaminophen (TYLENOL) 500 MG tablet Take 500 mg by mouth every 6 (six) hours as needed for moderate pain or headache.   . [DISCONTINUED] traMADol (ULTRAM) 50 MG tablet Take 1 tablet (50 mg total) by mouth every 12 (twelve) hours as needed. (Patient not taking: Reported on 04/22/2017)   No facility-administered encounter medications on file as of 04/22/2017.      Objective: Blood pressure 120/70, pulse 76, temperature 97.7 F (36.5 C), temperature source Oral, resp. rate 18, height 5' 7"  (1.702 m), weight 116 lb 8 oz (52.8 kg). Patient is alert and in no acute distress. Conjunctiva is pink. Sclera is nonicteric She does not have abnormal movement to her lips or tongue. Oropharyngeal mucosa is normal. No neck masses or thyromegaly noted. Cardiac exam with regular rhythm normal S1 and S2. No murmur or gallop noted. Lungs are clear to auscultation. Abdomen is symmetrical. Bowel sounds are hyperactive. On palpation abdomen is soft with mild tenderness at LLQ. No organomegaly or masses. No LE edema or clubbing noted. She does not have tremors to her extremities.   Assessment:  #1. Nausea and  vomiting most likely secondary to gastroparesis. She is on metoclopramide and not having any side effects. #2. Erosive reflux esophagitis. Symptom control is not satisfactory. #3. Weight loss. Her weight is down 216 pounds again and hopefully it will start going in the other direction. Setback secondary to back surgery that she had about a month ago. #4. Iron deficiency anemia. Hemoglobin was back to normal until she took ibuprofen  and hemoglobin dropped to low 8. She does not appear to be pale. Will check H&H. #5. Depression. Depression is not well controlled with low-dose escitalopram.   Plan:  New prescription given for Integra plus that she'll take 1 daily. Patient will go to the lab for H&H. Increase citalopram to 20 mg by mouth daily at bedtime. Office visit in 3 months.

## 2017-06-11 ENCOUNTER — Encounter (INDEPENDENT_AMBULATORY_CARE_PROVIDER_SITE_OTHER): Payer: Self-pay | Admitting: Internal Medicine

## 2017-06-26 ENCOUNTER — Ambulatory Visit (INDEPENDENT_AMBULATORY_CARE_PROVIDER_SITE_OTHER): Payer: Medicare Other | Admitting: Internal Medicine

## 2017-06-26 ENCOUNTER — Encounter (INDEPENDENT_AMBULATORY_CARE_PROVIDER_SITE_OTHER): Payer: Self-pay

## 2017-06-26 ENCOUNTER — Encounter (INDEPENDENT_AMBULATORY_CARE_PROVIDER_SITE_OTHER): Payer: Self-pay | Admitting: Internal Medicine

## 2017-06-26 VITALS — BP 110/70 | HR 72 | Temp 98.8°F | Resp 18 | Ht 67.0 in | Wt 118.5 lb

## 2017-06-26 DIAGNOSIS — Z862 Personal history of diseases of the blood and blood-forming organs and certain disorders involving the immune mechanism: Secondary | ICD-10-CM

## 2017-06-26 DIAGNOSIS — R634 Abnormal weight loss: Secondary | ICD-10-CM

## 2017-06-26 DIAGNOSIS — K2100 Gastro-esophageal reflux disease with esophagitis, without bleeding: Secondary | ICD-10-CM

## 2017-06-26 DIAGNOSIS — R112 Nausea with vomiting, unspecified: Secondary | ICD-10-CM

## 2017-06-26 NOTE — Patient Instructions (Addendum)
CBC to be done at the time of office visit with Dr. Morrie Sheldon. Call if you experience any side effects with metoclopramide.

## 2017-06-26 NOTE — Progress Notes (Signed)
Presenting complaint;  Follow-up for multiple GI problems.  Subjective:  Misty [name] is 76 year old Caucasian female who is here for scheduled visit. She was last seen 2 months ago. She feels about the same. She is generally having 2 or 3 episodes of vomiting every week. Lately all of these episodes of occurred when she is lying supine. She feels food debris in her throat and that she feel sick and vomits usually large amount of food debris. Sometimes she has vomited food that she'd eaten several are so earlier. She denies hematemesis or rectal bleeding. Her stools are black since she has been on by mouth iron. She seemed to have less regurgitation and vomiting when she lies on her left side. She has chronic back pain. She is unable to sleep on her back. She sleeps either on the left of the right side. Bowels remain irregular. At times she passes fecal balls and then she has diarrhea. When she has diarrhea she may have fecal seepage. She is therefore using depends. She is using polyethylene glycol on as-needed basis. She has not taken pain medication in over a month.  Current Medications: Outpatient Encounter Prescriptions as of 06/26/2017  Medication Sig  . acetaminophen (TYLENOL) 500 MG tablet Take 500 mg by mouth every 6 (six) hours as needed for moderate pain or headache.   . ALPRAZolam (XANAX) 0.5 MG tablet TAKE  (1)  TABLET  THREE TIMES DAILY AS NEEDED.  . cholecalciferol (VITAMIN D) 1000 UNITS tablet Take 2,000-4,000 Units by mouth daily. Take 2000 units daily except take 4000 units on Saturday and Sunday.  . escitalopram (LEXAPRO) 10 MG tablet Take 2 tablets (20 mg total) by mouth at bedtime.  . FeFum-FePoly-FA-B Cmp-C-Biot (INTEGRA PLUS) CAPS Take 1 capsule by mouth daily.  Marland Kitchen loperamide (IMODIUM) 2 MG capsule Take 1 capsule (2 mg total) by mouth daily as needed for diarrhea or loose stools.  . metoCLOPramide (REGLAN) 10 MG tablet Take 1 tablet (10 mg total) by mouth 4 (four) times daily.  .  Multiple Vitamins-Minerals (CENTRUM SILVER PO) Take 1 tablet by mouth daily.  . pantoprazole (PROTONIX) 40 MG tablet Take 1 tablet 2 (two) times daily before a meal.  . polyethylene glycol powder (GLYCOLAX/MIRALAX) powder Take 8.5 g by mouth daily.  . ranitidine (ZANTAC) 300 MG tablet Take 300 mg by mouth as needed for heartburn.  . vitamin B-12 (CYANOCOBALAMIN) 1000 MCG tablet Take 1,000 mcg by mouth daily.  Marland Kitchen HYDROcodone-acetaminophen (NORCO/VICODIN) 5-325 MG tablet Take 1 tablet by mouth every 6 (six) hours as needed.    No facility-administered encounter medications on file as of 06/26/2017.      Objective: Blood pressure 110/70, pulse 72, temperature 98.8 F (37.1 C), temperature source Oral, resp. rate 18, height 5' 7"  (1.702 m), weight 118 lb 8 oz (53.8 kg). Patient is alert and in no acute distress. He does not have abnormal movement to lips or tongue. He also does not have tremors to upper extremities. Conjunctiva is pink. Sclera is nonicteric Oropharyngeal mucosa is normal. No neck masses or thyromegaly noted. Cardiac exam with regular rhythm normal S1 and S2. No murmur or gallop noted. Lungs are clear to auscultation. Abdomen is symmetrical. Bowel sounds are normal. She has faint bruit and mid abdomen. Abdomen is soft. No succussion splash noted in epigastric region. No organomegaly or masses. No LE edema or clubbing noted.   Labs/studies Results: Lab data from 04/22/2017  H&H 11.5 and 36.9 and platelet count 203K.   Assessment:  #1.  Chronic nausea and vomiting. She is having spells less frequently. This symptom complex appears to be secondary to gastroparesis at least as she did not have pyloric stenosis of peptic ulcer disease. She is not sure if she wants to proceed with gastric emptying study. She is tolerating metoclopramide without any side effects.  #2. Erosive reflux esophagitis. GERD was documented on EGD of March 2013. She is requiring double dose PPI. I believe  primary processes gastroparesis at least. Upper GI series in October last year failed to show duodenal stricture or superior mesenteric artery syndrome.  #3. History of iron deficiency anemia. She has responded to by mouth iron. She will have CBC ordered next visit with Dr. Redge Gainer.  #4. Weight loss. She has gained 2 pounds her last visit. Her weight was upto 124 pounds prior to her back surgery in May 2018.  #5. Irritable bowel syndrome. She remains with unpredictable bowel movements. However she is not having 10-12 stools like she was having before. IBS flare appears to be related to stress disorder.   Plan:  Patient will have CBC on her next visit with Dr. Redge Gainer. Patient will continue metoclopramide and pantoprazole at current dose. Medication list updated. Pain medication deleted from her list. Patient advised to call office if she experiences any side effects with metoclopramide. Will consider solid-phase gastric emptying study off metoclopramide when she is ready. Office visit in 3 months.

## 2017-07-08 DIAGNOSIS — B351 Tinea unguium: Secondary | ICD-10-CM | POA: Diagnosis not present

## 2017-07-08 DIAGNOSIS — M79676 Pain in unspecified toe(s): Secondary | ICD-10-CM | POA: Diagnosis not present

## 2017-07-10 ENCOUNTER — Encounter: Payer: Self-pay | Admitting: Family Medicine

## 2017-07-10 ENCOUNTER — Ambulatory Visit (INDEPENDENT_AMBULATORY_CARE_PROVIDER_SITE_OTHER): Payer: Medicare Other | Admitting: Family Medicine

## 2017-07-10 VITALS — BP 121/69 | HR 73 | Temp 97.0°F | Ht 67.0 in | Wt 118.0 lb

## 2017-07-10 DIAGNOSIS — R197 Diarrhea, unspecified: Secondary | ICD-10-CM | POA: Diagnosis not present

## 2017-07-10 DIAGNOSIS — K219 Gastro-esophageal reflux disease without esophagitis: Secondary | ICD-10-CM

## 2017-07-10 DIAGNOSIS — E7800 Pure hypercholesterolemia, unspecified: Secondary | ICD-10-CM

## 2017-07-10 DIAGNOSIS — R5383 Other fatigue: Secondary | ICD-10-CM | POA: Diagnosis not present

## 2017-07-10 DIAGNOSIS — M4850XS Collapsed vertebra, not elsewhere classified, site unspecified, sequela of fracture: Secondary | ICD-10-CM | POA: Diagnosis not present

## 2017-07-10 DIAGNOSIS — R938 Abnormal findings on diagnostic imaging of other specified body structures: Secondary | ICD-10-CM | POA: Diagnosis not present

## 2017-07-10 DIAGNOSIS — D649 Anemia, unspecified: Secondary | ICD-10-CM

## 2017-07-10 DIAGNOSIS — E559 Vitamin D deficiency, unspecified: Secondary | ICD-10-CM | POA: Diagnosis not present

## 2017-07-10 DIAGNOSIS — R9389 Abnormal findings on diagnostic imaging of other specified body structures: Secondary | ICD-10-CM

## 2017-07-10 DIAGNOSIS — IMO0001 Reserved for inherently not codable concepts without codable children: Secondary | ICD-10-CM

## 2017-07-10 MED ORDER — ALPRAZOLAM 0.5 MG PO TABS: ORAL_TABLET | ORAL | 4 refills | Status: DC

## 2017-07-10 NOTE — Progress Notes (Signed)
Subjective:    Patient ID: Misty [name], female    DOB: 09-13-1941, 76 y.o.   MRN: [id number]  HPI Pt here for follow up and management of chronic medical problems which includes hyperlipidemia. She is taking medication regularly.The patient has been followed regularly by her gastroenterologist because of failure to thrive and abdominal pain and reflux. Her last visit with him was on August 9. The main complaint and problem was chronic nausea and vomiting with erosive reflux esophagitis. She had gained some weight and she also is diagnosed with irritable bowel syndrome. He is requesting that we get a CBC at this visit. Patient had more back surgery in May because of severe back pain and disc issues. She is doing much better with the pain but is having trouble recuperating because of her weakened body state from the pain and her esophagitis. Her whole demeanor today has improved and she is thankful for that and I reminded her that it may take several months to fully recuperate from this while staying on top of her reflux esophagitis. She denies any chest pain. She denies any shortness of breath other than from the weakness that she has recovering from the surgery and her esophageal problems. She denies having any blood in the stool or black tarry bowel movements. She says that when she swallows she can tell when she has eaten the proper quantity and has to stop. She is passing her water well but frequently. We did review the CT scan results with her and she said that her gastroenterologist is well aware of the CT scan results.    Patient Active Problem List   Diagnosis Date Noted  . Malnutrition of moderate degree 01/29/2016  . Symptomatic anemia 01/27/2016  . Generalized weakness 01/27/2016  . Abnormal chest x-ray 01/27/2016  . Tobacco use disorder 01/27/2016  . Colitis   . Hematochezia 02/26/2015  . Ischemic colitis ([place]) 02/26/2015  . Orthostatic hypotension 02/26/2015  . Non-traumatic  compression fracture of T11 thoracic vertebra (HCC) 07/08/2014  . Hyperlipidemia 08/19/2013  . Fibrocystic breast disease 08/19/2013  . History of migraine headaches 08/19/2013  . GERD (gastroesophageal reflux disease) 04/07/2013  . Osteoporosis, postmenopausal 04/07/2013   Outpatient Encounter Prescriptions as of 07/10/2017  Medication Sig  . acetaminophen (TYLENOL) 500 MG tablet Take 500 mg by mouth every 6 (six) hours as needed for moderate pain or headache.   . ALPRAZolam (XANAX) 0.5 MG tablet TAKE  (1)  TABLET  THREE TIMES DAILY AS NEEDED.  . cholecalciferol (VITAMIN D) 1000 UNITS tablet Take 2,000-4,000 Units by mouth daily. Take 2000 units daily except take 4000 units on Saturday and Sunday.  . escitalopram (LEXAPRO) 10 MG tablet Take 2 tablets (20 mg total) by mouth at bedtime.  . FeFum-FePoly-FA-B Cmp-C-Biot (INTEGRA PLUS) CAPS Take 1 capsule by mouth daily.  Marland Kitchen loperamide (IMODIUM) 2 MG capsule Take 1 capsule (2 mg total) by mouth daily as needed for diarrhea or loose stools.  . metoCLOPramide (REGLAN) 10 MG tablet Take 1 tablet (10 mg total) by mouth 4 (four) times daily.  . Multiple Vitamins-Minerals (CENTRUM SILVER PO) Take 1 tablet by mouth daily.  . pantoprazole (PROTONIX) 40 MG tablet Take 1 tablet 2 (two) times daily before a meal.  . polyethylene glycol powder (GLYCOLAX/MIRALAX) powder Take 8.5 g by mouth daily.  . ranitidine (ZANTAC) 300 MG tablet Take 300 mg by mouth as needed for heartburn.  . vitamin B-12 (CYANOCOBALAMIN) 1000 MCG tablet Take 1,000 mcg by mouth daily.  No facility-administered encounter medications on file as of 07/10/2017.       Review of Systems  HENT: Negative.   Eyes: Negative.   Respiratory: Negative.   Cardiovascular: Negative.   Gastrointestinal: Negative.   Endocrine: Negative.   Genitourinary: Negative.   Musculoskeletal: Negative.        Weak legs  Skin: Negative.   Allergic/Immunologic: Negative.   Neurological: Positive for  weakness.  Hematological: Negative.   Psychiatric/Behavioral: Negative.        Objective:   Physical Exam  Constitutional: She is oriented to person, place, and time. No distress.  Thin body habitus The patient is definitely in better spirits this time since she's had her back surgery. Her weight has stabilized.  HENT:  [name] Normocephalic and atraumatic.  Right Ear: External ear normal.  Nose: Nose normal.  Mouth/Throat: Oropharynx is clear and moist.  Ears cerumen left ear canal  Eyes: Pupils are equal, round, and reactive to light. Conjunctivae and EOM are normal. Right eye exhibits no discharge. Left eye exhibits no discharge. No scleral icterus.  Patient needs to schedule eye exam  Neck: Normal range of motion. Neck supple. No thyromegaly present.  No bruits thyromegaly or anterior cervical adenopathy  Cardiovascular: Normal rate, regular rhythm and normal heart sounds.   No murmur heard. The heart is regular at 84/m with good inguinal pulses  Pulmonary/Chest: Effort normal and breath sounds normal. No respiratory distress. She has no wheezes. She has no rales.  Clear anteriorly and posteriorly  Abdominal: Soft. Bowel sounds are normal. She exhibits no mass. There is no tenderness. There is no rebound and no guarding.  No epigastric or abdominal tenderness. No liver or spleen enlargement. No masses. Good inguinal pulses.  Musculoskeletal: She exhibits no edema or tenderness.  The patient is much more mobile than previously but still has some issues with laying down and sitting up but is improving with this.  Lymphadenopathy:    She has no cervical adenopathy.  Neurological: She is alert and oriented to person, place, and time. She has normal reflexes. No cranial nerve deficit.  Skin: Skin is warm and dry. No rash noted.  Psychiatric: She has a normal mood and affect. Her behavior is normal. Judgment and thought content normal.  Nursing note and vitals reviewed.  BP 121/69 (BP  Location: Left Arm)   Pulse 73   Temp (!) 97 F (36.1 C) (Oral)   Ht '5\' 7"'$  (1.702 m)   Wt 118 lb (53.5 kg)   BMI 18.48 kg/m         Assessment & Plan:  1. Pure hypercholesterolemia -Continue watching diet closely and getting exercise as tolerated - BMP8+EGFR - CBC with Differential/Platelet - Lipid panel  2. Vitamin D deficiency -Continue current treatment pending results of lab work - CBC with Differential/Platelet - VITAMIN D 25 Hydroxy (Vit-D Deficiency, Fractures)  3. Gastroesophageal reflux disease, esophagitis presence not specified -Continue current treatment and follow-up with Dr. Laural Golden as planned - CBC with Differential/Platelet - Hepatic function panel  4. Compression fracture of vertebra, sequela -Follow-up with Dr. Ellouise Newer and his assistant as planned and follow through with directions per therapy from him.  5. Low hemoglobin -CBC today with copy to gastroenterology  6. Diarrhea, unspecified type -This is been an ongoing problem and is thought to be due to some colitis. -She will continue to follow-up with gastroenterology.  7. Other fatigue -Reassure and insist on increasing therapy gradually so as not to reinjure her back. -  8. Abnormal chest CT -[place] was reviewed with the patient today and she says all her doctors are aware of it but she was given a copy of the report.  Meds ordered this encounter  Medications  . ALPRAZolam (XANAX) 0.5 MG tablet    Sig: TAKE  (1)  TABLET  THREE TIMES DAILY AS NEEDED.    Dispense:  90 tablet    Refill:  4   Patient Instructions                       Medicare Annual Wellness Visit  [facility] and the medical providers at [facility] strive to bring you the best medical care.  In doing so we not only want to address your current medical conditions and concerns but also to detect new conditions early and prevent illness, disease and health-related problems.    Medicare offers a yearly  Wellness Visit which allows our clinical staff to assess your need for preventative services including immunizations, lifestyle education, counseling to decrease risk of preventable diseases and screening for fall risk and other medical concerns.    This visit is provided free of charge (no copay) for all Medicare recipients. The clinical pharmacists at [facility] have begun to conduct these Wellness Visits which will also include a thorough review of all your medications.    As you primary medical provider recommend that you make an appointment for your Annual Wellness Visit if you have not done so already this year.  You may set up this appointment before you leave today or you may call back ([phone]) and schedule an appointment.  Please make sure when you call that you mention that you are scheduling your Annual Wellness Visit with the clinical pharmacist so that the appointment may be made for the proper length of time.     Continue current medications. Continue good therapeutic lifestyle changes which include good diet and exercise. Fall precautions discussed with patient. If an FOBT was given today- please return it to our front desk. If you are over 71 years old - you may need Prevnar 51 or the adult Pneumonia vaccine.  **Flu shots are available--- please call and schedule a FLU-CLINIC appointment**  After your visit with [place] today you will receive a survey in the mail or online from [employer] regarding your care with [place]. Please take a moment to fill this out. Your feedback is very important to [place] as you can help [place] better understand your patient needs as well as improve your experience and satisfaction. WE CARE ABOUT YOU!!!   Follow-up with neurosurgery as planned Continue with physical therapy with walking and not put herself at risk for falling Follow-up with gastroenterology as planned and we'll make sure that he gets a copy of the CBC that were doing  today. Return the FOBT if possible    Arrie Senate MD

## 2017-07-10 NOTE — Patient Instructions (Addendum)
Medicare Annual Wellness Visit  [facility] and the medical providers at [facility] strive to bring you the best medical care.  In doing so we not only want to address your current medical conditions and concerns but also to detect new conditions early and prevent illness, disease and health-related problems.    Medicare offers a yearly Wellness Visit which allows our clinical staff to assess your need for preventative services including immunizations, lifestyle education, counseling to decrease risk of preventable diseases and screening for fall risk and other medical concerns.    This visit is provided free of charge (no copay) for all Medicare recipients. The clinical pharmacists at [facility] have begun to conduct these Wellness Visits which will also include a thorough review of all your medications.    As you primary medical provider recommend that you make an appointment for your Annual Wellness Visit if you have not done so already this year.  You may set up this appointment before you leave today or you may call back ([phone]) and schedule an appointment.  Please make sure when you call that you mention that you are scheduling your Annual Wellness Visit with the clinical pharmacist so that the appointment may be made for the proper length of time.     Continue current medications. Continue good therapeutic lifestyle changes which include good diet and exercise. Fall precautions discussed with patient. If an FOBT was given today- please return it to our front desk. If you are over 76 years old - you may need Prevnar 13 or the adult Pneumonia vaccine.  **Flu shots are available--- please call and schedule a FLU-CLINIC appointment**  After your visit with [place] today you will receive a survey in the mail or online from [employer] regarding your care with [place]. Please take a moment to fill this out. Your feedback is very  important to [place] as you can help [place] better understand your patient needs as well as improve your experience and satisfaction. WE CARE ABOUT YOU!!!   Follow-up with neurosurgery as planned Continue with physical therapy with walking and not put herself at risk for falling Follow-up with gastroenterology as planned and we'll make sure that he gets a copy of the CBC that were doing today. Return the FOBT if possible

## 2017-07-11 LAB — LIPID PANEL
Chol/HDL Ratio: 2.3 ratio (ref 0.0–4.4)
Cholesterol, Total: 136 mg/dL (ref 100–199)
HDL: 58 mg/dL (ref >39)
LDL Calculated: 62 mg/dL (ref 0–99)
TRIGLYCERIDES: 80 mg/dL (ref 0–149)
VLDL CHOLESTEROL CAL: 16 mg/dL (ref 5–40)

## 2017-07-11 LAB — CBC WITH DIFFERENTIAL/PLATELET
BASOS ABS: 0 10*3/uL (ref 0.0–0.2)
BASOS: 1 %
EOS (ABSOLUTE): 0.3 10*3/uL (ref 0.0–0.4)
Eos: 4 %
Hematocrit: 36.6 % (ref 34.0–46.6)
Hemoglobin: 11.4 g/dL (ref 11.1–15.9)
IMMATURE GRANS (ABS): 0 10*3/uL (ref 0.0–0.1)
IMMATURE GRANULOCYTES: 0 %
LYMPHS: 22 %
Lymphocytes Absolute: 1.6 10*3/uL (ref 0.7–3.1)
MCH: 27.7 pg (ref 26.6–33.0)
MCHC: 31.1 g/dL — ABNORMAL LOW (ref 31.5–35.7)
MCV: 89 fL (ref 79–97)
MONOS ABS: 0.7 10*3/uL (ref 0.1–0.9)
Monocytes: 9 %
NEUTROS PCT: 64 %
Neutrophils Absolute: 4.6 10*3/uL (ref 1.4–7.0)
PLATELETS: 208 10*3/uL (ref 150–379)
RBC: 4.11 x10E6/uL (ref 3.77–5.28)
RDW: 17 % — AB (ref 12.3–15.4)
WBC: 7.2 10*3/uL (ref 3.4–10.8)

## 2017-07-11 LAB — BMP8+EGFR
BUN/Creatinine Ratio: 14 (ref 12–28)
BUN: 10 mg/dL (ref 8–27)
CO2: 29 mmol/L (ref 20–29)
Calcium: 9.6 mg/dL (ref 8.7–10.3)
Chloride: 101 mmol/L (ref 96–106)
Creatinine, Ser: 0.7 mg/dL (ref 0.57–1.00)
GFR calc Af Amer: 97 mL/min/{1.73_m2} (ref >59)
GFR, EST NON AFRICAN AMERICAN: 84 mL/min/{1.73_m2} (ref >59)
GLUCOSE: 111 mg/dL — AB (ref 65–99)
POTASSIUM: 3.9 mmol/L (ref 3.5–5.2)
SODIUM: 143 mmol/L (ref 134–144)

## 2017-07-11 LAB — HEPATIC FUNCTION PANEL
ALBUMIN: 4.1 g/dL (ref 3.5–4.8)
ALK PHOS: 108 IU/L (ref 39–117)
ALT: 13 IU/L (ref 0–32)
AST: 23 IU/L (ref 0–40)
Bilirubin Total: 0.3 mg/dL (ref 0.0–1.2)
Bilirubin, Direct: 0.09 mg/dL (ref 0.00–0.40)
Total Protein: 6.5 g/dL (ref 6.0–8.5)

## 2017-07-11 LAB — VITAMIN D 25 HYDROXY (VIT D DEFICIENCY, FRACTURES): VIT D 25 HYDROXY: 44.7 ng/mL (ref 30.0–100.0)

## 2017-07-14 ENCOUNTER — Telehealth: Payer: Self-pay | Admitting: *Deleted

## 2017-07-14 NOTE — Telephone Encounter (Signed)
Pt notified of results Verbalizes understanding 

## 2017-07-14 NOTE — Telephone Encounter (Signed)
-----   Message from Chipper Herb, MD sent at 07/11/2017  9:56 AM EDT ----- All liver function tests were within normal limits including the direct bilirubin. Please call the patient with these results as well as with results with recommendations that have already been reviewed

## 2017-08-14 DIAGNOSIS — S32010D Wedge compression fracture of first lumbar vertebra, subsequent encounter for fracture with routine healing: Secondary | ICD-10-CM | POA: Diagnosis not present

## 2017-09-02 ENCOUNTER — Telehealth: Payer: Self-pay | Admitting: Family Medicine

## 2017-09-03 ENCOUNTER — Ambulatory Visit (INDEPENDENT_AMBULATORY_CARE_PROVIDER_SITE_OTHER): Payer: Medicare Other

## 2017-09-03 DIAGNOSIS — Z23 Encounter for immunization: Secondary | ICD-10-CM

## 2017-09-04 DIAGNOSIS — H401213 Low-tension glaucoma, right eye, severe stage: Secondary | ICD-10-CM | POA: Diagnosis not present

## 2017-09-30 ENCOUNTER — Ambulatory Visit (INDEPENDENT_AMBULATORY_CARE_PROVIDER_SITE_OTHER): Payer: Medicare Other | Admitting: Internal Medicine

## 2017-09-30 ENCOUNTER — Encounter (INDEPENDENT_AMBULATORY_CARE_PROVIDER_SITE_OTHER): Payer: Self-pay | Admitting: Internal Medicine

## 2017-09-30 VITALS — BP 128/64 | HR 78 | Temp 97.8°F | Resp 18 | Ht 67.0 in | Wt 118.0 lb

## 2017-09-30 DIAGNOSIS — K2100 Gastro-esophageal reflux disease with esophagitis, without bleeding: Secondary | ICD-10-CM

## 2017-09-30 DIAGNOSIS — R634 Abnormal weight loss: Secondary | ICD-10-CM | POA: Diagnosis not present

## 2017-09-30 DIAGNOSIS — K588 Other irritable bowel syndrome: Secondary | ICD-10-CM | POA: Diagnosis not present

## 2017-09-30 DIAGNOSIS — R112 Nausea with vomiting, unspecified: Secondary | ICD-10-CM | POA: Diagnosis not present

## 2017-09-30 NOTE — Patient Instructions (Signed)
Physician will call with results of blood test when completed

## 2017-09-30 NOTE — Progress Notes (Signed)
Presenting complaint;  Follow-up for nausea vomiting GERD irregular bowel movements and iron deficiency anemia.  Subjective:  Patient is 76 year old Caucasian female who is here for scheduled visit.  She states she feels about the same.  She vomits couple of times every week.  It almost always occurs at night.  She states she vomits large amount of fluid in some food.  She has never vomited blood.  She says before she gets sick she has crazy/crawling feeling in her head.  She generally does not have nausea.  She is having 1-6 bowel movements per day.  She remains with intermittent urgency.  She has had fewer accidents.  She is using depends.  She continues to belch frequently.  Uses Tums for heartburn at night.  She is using MiraLAX on as-needed basis.  She remains active.  She tries to walk some.  She states her 42-monthold grandson keeps her busy. She had blood work by Dr. MLaurance Flattenabout 3 months ago which is reviewed below. She does not feel she is having any side effects with the metoclopramide.    Current Medications: Outpatient Encounter Medications as of 09/30/2017  Medication Sig  . acetaminophen (TYLENOL) 500 MG tablet Take 500 mg by mouth every 6 (six) hours as needed for moderate pain or headache.   . ALPRAZolam (XANAX) 0.5 MG tablet TAKE  (1)  TABLET  THREE TIMES DAILY AS NEEDED.  . cholecalciferol (VITAMIN D) 1000 UNITS tablet Take 2,000-4,000 Units by mouth daily. Take 2000 units daily except take 4000 units on Saturday and Sunday.  . escitalopram (LEXAPRO) 10 MG tablet Take 2 tablets (20 mg total) by mouth at bedtime.  . FeFum-FePoly-FA-B Cmp-C-Biot (INTEGRA PLUS) CAPS Take 1 capsule by mouth daily.  .Marland Kitchenloperamide (IMODIUM) 2 MG capsule Take 1 capsule (2 mg total) by mouth daily as needed for diarrhea or loose stools.  . metoCLOPramide (REGLAN) 10 MG tablet Take 1 tablet (10 mg total) by mouth 4 (four) times daily.  . Multiple Vitamins-Minerals (CENTRUM SILVER PO) Take 1 tablet by  mouth daily.  . pantoprazole (PROTONIX) 40 MG tablet Take 1 tablet 2 (two) times daily before a meal.  . polyethylene glycol powder (GLYCOLAX/MIRALAX) powder Take 8.5 g by mouth daily.  . ranitidine (ZANTAC) 300 MG tablet Take 300 mg by mouth as needed for heartburn.  . vitamin B-12 (CYANOCOBALAMIN) 1000 MCG tablet Take 1,000 mcg by mouth daily.   No facility-administered encounter medications on file as of 09/30/2017.      Objective: Blood pressure 128/64, pulse 78, temperature 97.8 F (36.6 C), temperature source Oral, resp. rate 18, height 5' 7"  (1.702 m), weight 118 lb (53.5 kg). Patient is alert and in no acute distress. Does not have tremors to her hands lips or tongue. Conjunctiva is pink. Sclera is nonicteric Oropharyngeal mucosa is normal. No neck masses or thyromegaly noted. Cardiac exam with regular rhythm normal S1 and S2. No murmur or gallop noted. Lungs are clear to auscultation. Abdomen.  Bowel sounds are normal.  On palpation abdomen is soft.  Stool balls felt in her sigmoid and descending colon. No LE edema or clubbing noted.  Labs/studies Results:  Lab data from 07/10/2017 WBC 7.2, H&H 11.4 and 36.6 and platelet count 208K.   Assessment:  #1.  Chronic nausea and vomiting felt to be secondary to gastroparesis.  Metoclopramide which she is tolerating well.  However symptom control is not 100%.  Any side effects with this medication.  Gastric emptying study has been recommended off  therapy but he has been too busy with family affairs and decided to postpone the study.    #2.  GERD with esophagitis.  Symptom control fair.  #3.  History of iron deficiency anemia.  Hemoglobin 3 months ago was low normal.  She remains on p.o. Iron.  #4.  Irritable bowel syndrome.  She remains with irregular bowel habits but having decreased frequency of stools when she has diarrhea.  Need to rule out celiac disease given multiple other symptoms.  #5.  Weight loss.  She has stopped  losing weight.  Weight loss appears to be due to diminished oral intake.   Plan:  Continue metoclopramide at current dose. Schedule patient for solid phase gastric emptying study when she is ready.  Study will be performed off metoclopramide. Celiac antibody panel. Office visit in 4 months.

## 2017-10-04 LAB — RETICULIN ANTIBODIES, IGA W TITER: RETICULIN IGA SCREEN: NEGATIVE

## 2017-10-04 LAB — GLIADIN ANTIBODIES, SERUM: Gliadin IgG: 10 Units

## 2017-10-04 LAB — TISSUE TRANSGLUTAMINASE, IGA: (tTG) Ab, IgA: 8 U/mL — ABNORMAL HIGH

## 2017-10-06 ENCOUNTER — Other Ambulatory Visit (INDEPENDENT_AMBULATORY_CARE_PROVIDER_SITE_OTHER): Payer: Self-pay | Admitting: *Deleted

## 2017-10-06 DIAGNOSIS — R112 Nausea with vomiting, unspecified: Secondary | ICD-10-CM | POA: Insufficient documentation

## 2017-10-14 ENCOUNTER — Ambulatory Visit (HOSPITAL_COMMUNITY)
Admission: RE | Admit: 2017-10-14 | Discharge: 2017-10-14 | Disposition: A | Discharge status: Home / Self Care | Payer: Medicare Other | Source: Ambulatory Visit | Attending: Internal Medicine | Admitting: Internal Medicine

## 2017-10-14 ENCOUNTER — Other Ambulatory Visit: Payer: Self-pay

## 2017-10-14 ENCOUNTER — Encounter (HOSPITAL_COMMUNITY): Payer: Self-pay

## 2017-10-14 ENCOUNTER — Encounter (HOSPITAL_COMMUNITY)
Admission: RE | Disposition: A | Discharge status: Home / Self Care | Payer: Self-pay | Source: Ambulatory Visit | Attending: Internal Medicine

## 2017-10-14 DIAGNOSIS — K766 Portal hypertension: Secondary | ICD-10-CM | POA: Insufficient documentation

## 2017-10-14 DIAGNOSIS — F329 Major depressive disorder, single episode, unspecified: Secondary | ICD-10-CM | POA: Insufficient documentation

## 2017-10-14 DIAGNOSIS — Z79899 Other long term (current) drug therapy: Secondary | ICD-10-CM | POA: Insufficient documentation

## 2017-10-14 DIAGNOSIS — K449 Diaphragmatic hernia without obstruction or gangrene: Secondary | ICD-10-CM | POA: Insufficient documentation

## 2017-10-14 DIAGNOSIS — K31819 Angiodysplasia of stomach and duodenum without bleeding: Secondary | ICD-10-CM | POA: Diagnosis not present

## 2017-10-14 DIAGNOSIS — K228 Other specified diseases of esophagus: Secondary | ICD-10-CM | POA: Diagnosis not present

## 2017-10-14 DIAGNOSIS — R112 Nausea with vomiting, unspecified: Secondary | ICD-10-CM

## 2017-10-14 DIAGNOSIS — F419 Anxiety disorder, unspecified: Secondary | ICD-10-CM | POA: Diagnosis not present

## 2017-10-14 DIAGNOSIS — G4700 Insomnia, unspecified: Secondary | ICD-10-CM | POA: Diagnosis not present

## 2017-10-14 DIAGNOSIS — Z886 Allergy status to analgesic agent status: Secondary | ICD-10-CM | POA: Insufficient documentation

## 2017-10-14 DIAGNOSIS — J449 Chronic obstructive pulmonary disease, unspecified: Secondary | ICD-10-CM | POA: Insufficient documentation

## 2017-10-14 DIAGNOSIS — K3189 Other diseases of stomach and duodenum: Secondary | ICD-10-CM | POA: Insufficient documentation

## 2017-10-14 DIAGNOSIS — F17210 Nicotine dependence, cigarettes, uncomplicated: Secondary | ICD-10-CM | POA: Insufficient documentation

## 2017-10-14 DIAGNOSIS — E785 Hyperlipidemia, unspecified: Secondary | ICD-10-CM | POA: Insufficient documentation

## 2017-10-14 DIAGNOSIS — Z8619 Personal history of other infectious and parasitic diseases: Secondary | ICD-10-CM | POA: Diagnosis not present

## 2017-10-14 DIAGNOSIS — K210 Gastro-esophageal reflux disease with esophagitis: Secondary | ICD-10-CM | POA: Diagnosis not present

## 2017-10-14 HISTORY — PX: ESOPHAGOGASTRODUODENOSCOPY: SHX5428

## 2017-10-14 SURGERY — EGD (ESOPHAGOGASTRODUODENOSCOPY)
Anesthesia: Moderate Sedation

## 2017-10-14 MED ORDER — SODIUM CHLORIDE 0.9 % IV SOLN
INTRAVENOUS | Status: DC
  Administered 2017-10-14: 07:00:00 via INTRAVENOUS

## 2017-10-14 MED ORDER — MEPERIDINE HCL 50 MG/ML IJ SOLN
INTRAMUSCULAR | Status: DC | PRN
  Administered 2017-10-14 (×2): 25 mg via INTRAVENOUS

## 2017-10-14 MED ORDER — MIDAZOLAM HCL 5 MG/5ML IJ SOLN
INTRAMUSCULAR | Status: AC
  Filled 2017-10-14: qty 10

## 2017-10-14 MED ORDER — STERILE WATER FOR IRRIGATION IR SOLN
Status: DC | PRN
  Administered 2017-10-14: 100 mL

## 2017-10-14 MED ORDER — MEPERIDINE HCL 50 MG/ML IJ SOLN
INTRAMUSCULAR | Status: AC
  Filled 2017-10-14: qty 1

## 2017-10-14 MED ORDER — MIDAZOLAM HCL 5 MG/5ML IJ SOLN
INTRAMUSCULAR | Status: DC | PRN
  Administered 2017-10-14 (×4): 2 mg via INTRAVENOUS

## 2017-10-14 MED ORDER — LIDOCAINE VISCOUS 2 % MT SOLN
OROMUCOSAL | Status: AC
  Filled 2017-10-14: qty 15

## 2017-10-14 NOTE — H&P (Signed)
Misty [name] is an 76 y.o. female.   Chief Complaint: Patient is here for EGD. HPI: Patient is 76 year old Caucasian female with multiple medical problems including GERD possible gastroparesis as well as irregular bowel movements and weight loss who symptoms are poorly controlled with PPI and promotility agent.  She had EGD in March last year revealing erosive reflux esophagitis small sliding hiatal hernia gave without bleeding.  Patient has continued to vomit a few times a week.  She was seen in the office recently and celiac antibody panel was obtained.  Celiac antibody panel was abnormal.  Therefore patient was advised diagnostic EGD.  Patient states she vomited yesterday and day before.  When she vomits is a large amount of liquid and food.  She denies hematemesis melena or rectal bleeding.  Past Medical History:  Diagnosis Date  . Anxiety    takes Xanax daily  . Arthritis    back  . Cataract   . Chronic back pain   . Constipation    OTC stool softener prn  . COPD (chronic obstructive pulmonary disease) ([place])   . Depression   . Diverticulosis   . GERD (gastroesophageal reflux disease)    takes Ranidine daily  . Hemorrhoids   . History of bronchitis   . History of migraine    many yrs ago  . History of shingles   . Hyperlipidemia   . Insomnia   . Migraines   . Nocturia   . Numbness    left leg  . Osteoporosis   . PONV (postoperative nausea and vomiting)   . Urinary frequency   . Urinary urgency     Past Surgical History:  Procedure Laterality Date  . ABDOMINAL HYSTERECTOMY    . COLONOSCOPY    . COLONOSCOPY N/A 02/17/2015   Procedure: COLONOSCOPY;  Surgeon: Rogene Houston, MD;  [facility];  Service: Endoscopy;  Laterality: N/A;  110n - moved to 11:15 - Ann to notify pt  . ESOPHAGOGASTRODUODENOSCOPY    . ESOPHAGOGASTRODUODENOSCOPY N/A 01/29/2016   Procedure: ESOPHAGOGASTRODUODENOSCOPY (EGD);  Surgeon: Rogene Houston, MD;  Location: [facility];  Service:  Endoscopy;  Laterality: N/A;  . EYE SURGERY Right 3/15   cataracts  . KYPHOPLASTY N/A 07/08/2014   Procedure: Thoracic Eleven Kyphoplasty;  Surgeon: Hosie Spangle, MD;  Location: [facility]O ORS;  Service: Neurosurgery;  Laterality: N/A;  . LUMBAR LAMINECTOMY/DECOMPRESSION MICRODISCECTOMY Bilateral 05/20/2013   Procedure: LUMBAR LAMINECTOMY/DECOMPRESSION MICRODISCECTOMY 1 LEVEL;  Surgeon: Hosie Spangle, MD;  Location: [facility] NEURO ORS;  Service: Neurosurgery;  Laterality: Bilateral;  Bilateral Lumbar four-five laminotomy and left lumbar four-five microdiskectomy    Family History  Problem Relation Age of Onset  . Hypertension Mother   . Colon cancer Neg Hx    Social History:  reports that she has been smoking cigarettes.  She started smoking about 58 years ago. She has a 52.00 pack-year smoking history. she has never used smokeless tobacco. She reports that she does not drink alcohol or use drugs.  Allergies:  Allergies  Allergen Reactions  . Codeine Nausea And Vomiting  . Tramadol Nausea And Vomiting  . Asa [Aspirin] Other (See Comments)    Patient is unaware of allergy  . Azithromycin Other (See Comments)    Patient is unaware of allergy  . Celebrex [Celecoxib] Other (See Comments)    Irritated stomach   . Cymbalta [Duloxetine Hcl] Other (See Comments)    Felt groggy  . Prednisone Rash    She  has seen the podiatrist and he had injected cortisone in her feet and she had also taken a peel at home. She had a severe reaction to her face with a rash and had to take Benadryl to resolve the symptom. She actually did get more cortisone shots, but no additional reactions to the shots.  . Vioxx [Rofecoxib] Other (See Comments)    Unknown  . Zelnorm [Tegaserod] Other (See Comments)    Patient is unaware of allergy  . Zocor [Simvastatin] Other (See Comments)    Patient is unaware of allergy    Medications Prior to Admission  Medication Sig Dispense Refill  . acetaminophen (TYLENOL) 500  MG tablet Take 500 mg by mouth every 6 (six) hours as needed for moderate pain or headache.     . ALPRAZolam (XANAX) 0.5 MG tablet TAKE  (1)  TABLET  THREE TIMES DAILY AS NEEDED. 90 tablet 4  . cholecalciferol (VITAMIN D) 1000 UNITS tablet Take 2,000-4,000 Units by mouth daily. Take 2000 units daily except take 4000 units on Saturday and Sunday.    . escitalopram (LEXAPRO) 10 MG tablet Take 2 tablets (20 mg total) by mouth at bedtime. 60 tablet 5  . FeFum-FePoly-FA-B Cmp-C-Biot (INTEGRA PLUS) CAPS Take 1 capsule by mouth daily. 30 capsule 5  . loperamide (IMODIUM) 2 MG capsule Take 1 capsule (2 mg total) by mouth daily as needed for diarrhea or loose stools.    . metoCLOPramide (REGLAN) 10 MG tablet Take 1 tablet (10 mg total) by mouth 4 (four) times daily. 120 tablet 1  . Multiple Vitamin (MULTIVITAMIN WITH MINERALS) TABS tablet Take 1 tablet by mouth daily. Centrum Silver    . pantoprazole (PROTONIX) 40 MG tablet Take 1 tablet 2 (two) times daily before a meal. 60 tablet 5  . ranitidine (ZANTAC) 300 MG tablet Take 300 mg by mouth daily as needed for heartburn.     . vitamin B-12 (CYANOCOBALAMIN) 1000 MCG tablet Take 1,000 mcg by mouth daily.    . polyethylene glycol powder (GLYCOLAX/MIRALAX) powder Take 8.5 g by mouth daily. (Patient taking differently: Take 8.5 g by mouth daily as needed (for constipation.). ) 255 g 0    No results found for this or any previous visit (from the past 48 hour(s)). No results found.  ROS  Blood pressure (!) 144/73, pulse 85, temperature 98 F (36.7 C), temperature source Oral, resp. rate 20, SpO2 92 %. Physical Exam  Constitutional:  Well-developed thin Caucasian female in NAD.  HENT:  Mouth/Throat: Oropharynx is clear and moist.  Eyes: Conjunctivae are normal. No scleral icterus.  Neck: No thyromegaly present.  Cardiovascular: Normal rate, regular rhythm and normal heart sounds.  No murmur heard. Respiratory: Effort normal and breath sounds normal.   GI: Soft. She exhibits no distension and no mass. There is no tenderness.  Palpable stool in in left lower quadrant of abdomen.  Musculoskeletal: She exhibits no edema.  Lymphadenopathy:    She has no cervical adenopathy.  Neurological: She is alert.  Skin: Skin is warm and dry.     Assessment/Plan Chronic nausea and vomiting. Known erosive reflux esophagitis. Positive serology for celiac disease. Diagnostic EGD.  Hildred Laser, MD 10/14/2017, 7:35 AM

## 2017-10-14 NOTE — Op Note (Signed)
[facility] Patient Name: Misty [name] Procedure Date: 10/14/2017 7:14 AM MRN: [id number] Date of Birth: 1941/02/26 Attending MD: Hildred Laser , MD CSN: [id number] Age: 76 Admit Type: Outpatient Procedure:                Upper GI endoscopy Indications:              Diagnostic procedure, Nausea with vomiting,                            Positive serology for Celiac disease. Providers:                Hildred Laser, MD, Rosina Lowenstein, RN, Aram Candela Referring MD:             Chipper Herb, MD Medicines:                Lidocaine spray, Meperidine 50 mg IV, Midazolam 8                            mg IV Complications:            No immediate complications. Estimated Blood Loss:     Estimated blood loss was minimal. Procedure:                Pre-Anesthesia Assessment:                           - Prior to the procedure, a History and Physical                            was performed, and patient medications and                            allergies were reviewed. The patient's tolerance of                            previous anesthesia was also reviewed. The risks                            and benefits of the procedure and the sedation                            options and risks were discussed with the patient.                            All questions were answered, and informed consent                            was obtained. Prior Anticoagulants: The patient has                            taken no previous anticoagulant or antiplatelet                            agents. ASA Grade Assessment: III - A patient with  severe systemic disease. After reviewing the risks                            and benefits, the patient was deemed in                            satisfactory condition to undergo the procedure.                           After obtaining informed consent, the endoscope was                            passed under direct vision. Throughout the                         procedure, the patient's blood pressure, pulse, and                            oxygen saturations were monitored continuously. The                            [id number] ([id number]) scope was introduced through the                            mouth, and advanced to the second part of duodenum.                            The upper GI endoscopy was accomplished without                            difficulty. The patient tolerated the procedure                            well. Scope In: 7:48:48 AM Scope Out: 7:59:19 AM Total Procedure Duration: 0 hours 10 minutes 31 seconds  Findings:      The proximal esophagus was normal.      LA Grade C (one or more mucosal breaks continuous between tops of 2 or       more mucosal folds, less than 75% circumference) esophagitis with no       bleeding was found 28 to 39 cm from the incisors.      The Z-line was irregular and was found 39 cm from the incisors.      A 2 cm hiatal hernia was present.      A small amount of food (residue) was found in the gastric body.      Moderate portal hypertensive gastropathy was found in the gastric fundus       and in the gastric body. Biopsies were taken with a cold forceps for       histology. The pathology specimen was placed into Bottle Number 2.      Mild gastric antral vascular ectasia was present in the gastric antrum       and in the prepyloric region of the stomach.      The exam of the stomach was otherwise normal.      Decreased folds were found in the duodenal  bulb, flattening was found in       the duodenal bulb and scalloped mucosa was found in the duodenal bulb.      Decreased folds were found in the second portion of the duodenum,       flattening was found in the second portion of the duodenum and scalloped       mucosa was found in the second portion of the duodenum. Biopsies for       histology were taken with a cold      forceps for evaluation of celiac disease. The pathology  specimen was       placed into Bottle Number 1. Impression:               - Normal proximal esophagus.                           - LA Grade C reflux esophagitis.                           - Z-line irregular, 39 cm from the incisors.                           - 2 cm hiatal hernia.                           - A small amount of food (residue) in the stomach.                           - Portal hypertensive gastropathy. Biopsied.                           - Gastric antral vascular ectasia.                           - Duodenal mucosal changes seen, suspicious for                            celiac disease. Biopsied. Moderate Sedation:      Moderate (conscious) sedation was administered by the endoscopy nurse       and supervised by the endoscopist. The following parameters were       monitored: oxygen saturation, heart rate, blood pressure, CO2       capnography and response to care. Total physician intraservice time was       18 minutes. Recommendation:           - Patient has a contact number available for                            emergencies. The signs and symptoms of potential                            delayed complications were discussed with the                            patient. Return to normal activities tomorrow.  Written discharge instructions were provided to the                            patient.                           - Resume previous diet today.                           - Continue present medications.                           - No aspirin, ibuprofen, naproxen, or other                            non-steroidal anti-inflammatory drugs for 1 day.                           - Await pathology results. Procedure Code(s):        --- Professional ---                           [phone], Esophagogastroduodenoscopy, flexible,                            transoral; with biopsy, single or multiple                           99152, Moderate sedation services  provided by the                            same physician or other qualified health care                            professional performing the diagnostic or                            therapeutic service that the sedation supports,                            requiring the presence of an independent trained                            observer to assist in the monitoring of the                            patient's level of consciousness and physiological                            status; initial 15 minutes of intraservice time,                            patient age 7 years or older Diagnosis Code(s):        --- Professional ---  K21.0, Gastro-esophageal reflux disease with                            esophagitis                           K22.8, Other specified diseases of esophagus                           K44.9, Diaphragmatic hernia without obstruction or                            gangrene                           K76.6, Portal hypertension                           K31.89, Other diseases of stomach and duodenum                           K31.819, Angiodysplasia of stomach and duodenum                            without bleeding                           R11.2, Nausea with vomiting, unspecified CPT copyright 2016 American Medical Association. All rights reserved. The codes documented in this report are preliminary and upon coder review may  be revised to meet current compliance requirements. Hildred Laser, MD Hildred Laser, MD 10/14/2017 8:15:51 AM This report has been signed electronically. Number of Addenda: 0

## 2017-10-14 NOTE — Discharge Instructions (Signed)
No aspirin or NSAIDs for 24 hours. Resume usual medications and diet. No driving for 24 hours. Physician will call with biopsy results and further recommendations.   Upper Endoscopy, Care After Refer to this sheet in the next few weeks. These instructions provide you with information about caring for yourself after your procedure. Your health care provider may also give you more specific instructions. Your treatment has been planned according to current medical practices, but problems sometimes occur. Call your health care provider if you have any problems or questions after your procedure. What can I expect after the procedure? After the procedure, it is common to have:  A sore throat.  Bloating.  Nausea.  Follow these instructions at home:  Follow instructions from your health care provider about what to eat or drink after your procedure.  Return to your normal activities as told by your health care provider. Ask your health care provider what activities are safe for you.  Take over-the-counter and prescription medicines only as told by your health care provider.  Do not drive for 24 hours if you received a sedative.  Keep all follow-up visits as told by your health care provider. This is important. Contact a health care provider if:  You have a sore throat that lasts longer than one day.  You have trouble swallowing. Get help right away if:  You have a fever.  You vomit blood or your vomit looks like coffee grounds.  You have bloody, black, or tarry stools.  You have a severe sore throat or you cannot swallow.  You have difficulty breathing.  You have severe pain in your chest or belly. This information is not intended to replace advice given to you by your health care provider. Make sure you discuss any questions you have with your health care provider. Document Released: 05/05/2012 Document Revised: 04/11/2016 Document Reviewed: 08/17/2015 Elsevier Interactive  Patient Education  2017 [employer]   Gluten-Free Diet for Celiac Disease, Adult The gluten-free diet includes all foods that do not contain gluten. Gluten is a protein that is found in wheat, rye, barley, and some other grains. Following the gluten-free diet is the only treatment for people with celiac disease. It helps to prevent damage to the intestines and improves or eliminates the symptoms of celiac disease. Following the gluten-free diet requires some planning. It can be challenging at first, but it gets easier with time and practice. There are more gluten-free options available today than ever before. If you need help finding gluten-free foods or if you have questions, talk with your diet and nutrition specialist (registered dietitian) or your health care provider. What do I need to know about a gluten-free diet?  All fruits, vegetables, and meats are safe to eat and do not contain gluten.  When grocery shopping, start by shopping in the produce, meat, and dairy sections. These sections are more likely to contain gluten-free foods. Then move to the aisles that contain packaged foods if you need to.  Read all food labels. Gluten is often added to foods. Always check the ingredient list and look for warnings, such as may contain gluten."  Talk with your dietitian or health care provider before taking a gluten-free multivitamin or mineral supplement.  Be aware of gluten-free foods having contact with foods that contain gluten (cross-contamination). This can happen at home and with any processed foods. ? Talk with your health care provider or dietitian about how to reduce the risk of cross-contamination in your home. ? If  you have questions about how a food is processed, ask the manufacturer. What key words help to identify gluten? Foods that list any of these key words on the label usually contain gluten:  Wheat, flour, enriched flour, bromated flour, white flour, durum flour, graham  flour, phosphated flour, self-rising flour, semolina, farina, barley (malt), rye, and oats.  Starch, dextrin, modified food starch, or cereal.  Thickening, fillers, or emulsifiers.  Malt flavoring, malt extract, or malt syrup.  Hydrolyzed vegetable protein.  In the [place] packaged foods that are gluten-free are required to be labeled GF. These foods should be easy to identify and are safe to eat. In the [place] food companies are also required to list common food allergens, including wheat, on their labels. Recommended foods Grains  Amaranth, bean flours, 100% buckwheat flour, corn, millet, nut flours or nut meals, GF oats, quinoa, rice, sorghum, teff, rice wafers, pure cornmeal tortillas, popcorn, and hot cereals made from cornmeal. Hominy, rice, wild rice. Some Asian rice noodles or bean noodles. Arrowroot starch, corn bran, corn flour, corn germ, cornmeal, corn starch, potato flour, potato starch flour, and rice bran. Plain, brown, and sweet rice flours. Rice polish, soy flour, and tapioca starch. Vegetables  All plain fresh, frozen, and canned vegetables. Fruits  All plain fresh, frozen, canned, and dried fruits, and 100% fruit juices. Meats and other protein foods  All fresh beef, pork, poultry, fish, seafood, and eggs. Fish canned in water, oil, brine, or vegetable broth. Plain nuts and seeds, peanut butter. Some lunch meat and some frankfurters. Dried beans, dried peas, and lentils. Dairy  Fresh plain, dry, evaporated, or condensed milk. Cream, butter, sour cream, whipping cream, and most yogurts. Unprocessed cheese, most processed cheeses, some cottage cheese, some cream cheeses. Beverages  Coffee, tea, most herbal teas. Carbonated beverages and some root beers. Wine, sake, and distilled spirits, such as gin, vodka, and whiskey. Most hard ciders. Fats and oils  Butter, margarine, vegetable oil, hydrogenated butter, olive oil, shortening, lard, cream, and some mayonnaise. Some  commercial salad dressings. Olives. Sweets and desserts  Sugar, honey, some syrups, molasses, jelly, and jam. Plain hard candy, marshmallows, and gumdrops. Pure cocoa powder. Plain chocolate. Custard and some pudding mixes. Gelatin desserts, sorbets, frozen ice pops, and sherbet. Cake, cookies, and other desserts prepared with allowed flours. Some commercial ice creams. Cornstarch, tapioca, and rice puddings. Seasoning and other foods  Some canned or frozen soups. Monosodium glutamate (MSG). Cider, rice, and wine vinegar. Baking soda and baking powder. Cream of tartar. Baking and nutritional yeast. Certain soy sauces made without wheat (ask your dietitian about specific brands that are allowed). Nuts, coconut, and chocolate. Salt, pepper, herbs, spices, flavoring extracts, imitation or artificial flavorings, natural flavorings, and food colorings. Some medicines and supplements. Some lip glosses and other cosmetics. Rice syrups. The items listed may not be a complete list. Talk with your dietitian about what dietary choices are best for you. Foods to avoid Grains  Barley, bran, bulgur, couscous, cracked wheat, [place], farro, graham, malt, matzo, semolina, wheat germ, and all wheat and rye cereals including spelt and kamut. Cereals containing malt as a flavoring, such as rice cereal. Noodles, spaghetti, macaroni, most packaged rice mixes, and all mixes containing wheat, rye, barley, or triticale. Vegetables  Most creamed vegetables and most vegetables canned in sauces. Some commercially prepared vegetables and salads. Fruits  Thickened or prepared fruits and some pie fillings. Some fruit snacks and fruit roll-ups. Meats and other protein foods  Any meat or meat  alternative containing wheat, rye, barley, or gluten stabilizers. These are often marinated or packaged meats and lunch meats. Bread-containing products, such as Swiss steak, croquettes, meatballs, and meatloaf. Most tuna canned in  vegetable broth and [place]key with hydrolyzed vegetable protein (HVP) injected as part of the basting. Seitan. Imitation fish. Eggs in sauces made from ingredients to avoid. Dairy  Commercial chocolate milk drinks and malted milk. Some non-dairy creamers. Any cheese product containing ingredients to avoid. Beverages  Certain cereal beverages. Beer, ale, malted milk, and some root beers. Some hard ciders. Some instant flavored coffees. Some herbal teas made with barley or with barley malt added. Fats and oils  Some commercial salad dressings. Sour cream containing modified food starch. Sweets and desserts  Some toffees. Chocolate-coated nuts (may be rolled in wheat flour) and some commercial candies and candy bars. Most cakes, cookies, donuts, pastries, and other baked goods. Some commercial ice cream. Ice cream cones. Commercially prepared mixes for cakes, cookies, and other desserts. Bread pudding and other puddings thickened with flour. Products containing brown rice syrup made with barley malt enzyme. Desserts and sweets made with malt flavoring. Seasoning and other foods  Some curry powders, some dry seasoning mixes, some gravy extracts, some meat sauces, some ketchups, some prepared mustards, and horseradish. Certain soy sauces. Malt vinegar. Bouillon and bouillon cubes that contain HVP. Some chip dips, and some chewing gum. Yeast extract. Brewers yeast. Caramel color. Some medicines and supplements. Some lip glosses and other cosmetics. The items listed may not be a complete list. Talk with your dietitian about what dietary choices are best for you. Summary  Gluten is a protein that is found in wheat, rye, barley, and some other grains. The gluten-free diet includes all foods that do not contain gluten.  If you need help finding gluten-free foods or if you have questions, talk with your diet and nutrition specialist (registered dietitian) or your health care provider.  Read all food  labels. Gluten is often added to foods. Always check the ingredient list and look for warnings, such as may contain gluten." This information is not intended to replace advice given to you by your health care provider. Make sure you discuss any questions you have with your health care provider. Document Released: 11/04/2005 Document Revised: 08/19/2016 Document Reviewed: 08/19/2016 Elsevier Interactive Patient Education  2018 [employer]

## 2017-10-17 ENCOUNTER — Telehealth (INDEPENDENT_AMBULATORY_CARE_PROVIDER_SITE_OTHER): Payer: Self-pay | Admitting: Internal Medicine

## 2017-10-17 NOTE — Telephone Encounter (Signed)
Duodenal biopsy results reviewed with patient. Patient has celiac disease. We will arrange for dietary consultation. She will call office with progress report after she has been on gluten-free diet for 2 weeks.

## 2017-10-20 ENCOUNTER — Encounter (HOSPITAL_COMMUNITY): Payer: Self-pay | Admitting: Internal Medicine

## 2017-10-22 NOTE — Telephone Encounter (Signed)
Noted, I have talked with the patient and I have also sent to her a Gluten Free Diet.

## 2017-11-05 ENCOUNTER — Telehealth (INDEPENDENT_AMBULATORY_CARE_PROVIDER_SITE_OTHER): Payer: Self-pay | Admitting: *Deleted

## 2017-11-05 ENCOUNTER — Other Ambulatory Visit (INDEPENDENT_AMBULATORY_CARE_PROVIDER_SITE_OTHER): Payer: Self-pay | Admitting: Internal Medicine

## 2017-11-05 NOTE — Telephone Encounter (Signed)
Patient was called to see how she was doing. Phone rang multiple times , no voicemail. Will try and call the patient tomorrow.

## 2017-11-24 DIAGNOSIS — Z1231 Encounter for screening mammogram for malignant neoplasm of breast: Secondary | ICD-10-CM | POA: Diagnosis not present

## 2017-11-24 LAB — HM MAMMOGRAPHY

## 2017-11-27 ENCOUNTER — Ambulatory Visit: Payer: Medicare Other | Admitting: Nutrition

## 2017-12-03 ENCOUNTER — Encounter: Payer: Self-pay | Admitting: Family Medicine

## 2017-12-03 ENCOUNTER — Ambulatory Visit (INDEPENDENT_AMBULATORY_CARE_PROVIDER_SITE_OTHER): Payer: Medicare Other | Admitting: Family Medicine

## 2017-12-03 VITALS — BP 112/63 | HR 85 | Temp 98.2°F | Ht 67.0 in | Wt 123.0 lb

## 2017-12-03 DIAGNOSIS — K559 Vascular disorder of intestine, unspecified: Secondary | ICD-10-CM | POA: Diagnosis not present

## 2017-12-03 DIAGNOSIS — R9389 Abnormal findings on diagnostic imaging of other specified body structures: Secondary | ICD-10-CM | POA: Diagnosis not present

## 2017-12-03 DIAGNOSIS — L820 Inflamed seborrheic keratosis: Secondary | ICD-10-CM

## 2017-12-03 DIAGNOSIS — E559 Vitamin D deficiency, unspecified: Secondary | ICD-10-CM

## 2017-12-03 DIAGNOSIS — E7800 Pure hypercholesterolemia, unspecified: Secondary | ICD-10-CM | POA: Diagnosis not present

## 2017-12-03 DIAGNOSIS — K219 Gastro-esophageal reflux disease without esophagitis: Secondary | ICD-10-CM

## 2017-12-03 DIAGNOSIS — K9041 Non-celiac gluten sensitivity: Secondary | ICD-10-CM | POA: Diagnosis not present

## 2017-12-03 DIAGNOSIS — J8410 Pulmonary fibrosis, unspecified: Secondary | ICD-10-CM | POA: Diagnosis not present

## 2017-12-03 MED ORDER — ALPRAZOLAM 0.5 MG PO TABS: ORAL_TABLET | ORAL | 4 refills | Status: DC

## 2017-12-03 NOTE — Patient Instructions (Addendum)
Medicare Annual Wellness Visit  Gregory and the medical providers at [facility] strive to bring you the best medical care.  In doing so we not only want to address your current medical conditions and concerns but also to detect new conditions early and prevent illness, disease and health-related problems.    Medicare offers a yearly Wellness Visit which allows our clinical staff to assess your need for preventative services including immunizations, lifestyle education, counseling to decrease risk of preventable diseases and screening for fall risk and other medical concerns.    This visit is provided free of charge (no copay) for all Medicare recipients. The clinical pharmacists at [facility] have begun to conduct these Wellness Visits which will also include a thorough review of all your medications.    As you primary medical provider recommend that you make an appointment for your Annual Wellness Visit if you have not done so already this year.  You may set up this appointment before you leave today or you may call back ([phone]) and schedule an appointment.  Please make sure when you call that you mention that you are scheduling your Annual Wellness Visit with the clinical pharmacist so that the appointment may be made for the proper length of time.    Continue current medications. Continue good therapeutic lifestyle changes which include good diet and exercise. Fall precautions discussed with patient. If an FOBT was given today- please return it to our front desk. If you are over 5 years old - you may need Prevnar 13 or the adult Pneumonia vaccine.  **Flu shots are available--- please call and schedule a FLU-CLINIC appointment**  After your visit with [place] today you will receive a survey in the mail or online from [employer] regarding your care with [place]. Please take a moment to fill this out. Your feedback is very  important to [place] as you can help [place] better understand your patient needs as well as improve your experience and satisfaction. WE CARE ABOUT YOU!!!      Gluten-Free Diet for Celiac Disease, Adult The gluten-free diet includes all foods that do not contain gluten. Gluten is a protein that is found in wheat, rye, barley, and some other grains. Following the gluten-free diet is the only treatment for people with celiac disease. It helps to prevent damage to the intestines and improves or eliminates the symptoms of celiac disease. Following the gluten-free diet requires some planning. It can be challenging at first, but it gets easier with time and practice. There are more gluten-free options available today than ever before. If you need help finding gluten-free foods or if you have questions, talk with your diet and nutrition specialist (registered dietitian) or your health care provider. What do I need to know about a gluten-free diet?  All fruits, vegetables, and meats are safe to eat and do not contain gluten.  When grocery shopping, start by shopping in the produce, meat, and dairy sections. These sections are more likely to contain gluten-free foods. Then move to the aisles that contain packaged foods if you need to.  Read all food labels. Gluten is often added to foods. Always check the ingredient list and look for warnings, such as "may contain gluten."  Talk with your dietitian or health care provider before taking a gluten-free multivitamin or mineral supplement.  Be aware of gluten-free foods having contact with foods that contain gluten (cross-contamination). This can happen at home and with any processed foods. ?  Talk with your health care provider or dietitian about how to reduce the risk of cross-contamination in your home. ? If you have questions about how a food is processed, ask the manufacturer. What key words help to identify gluten? Foods that list any of these key words on the  label usually contain gluten:  Wheat, flour, enriched flour, bromated flour, white flour, durum flour, graham flour, phosphated flour, self-rising flour, semolina, farina, barley (malt), rye, and oats.  Starch, dextrin, modified food starch, or cereal.  Thickening, fillers, or emulsifiers.  Malt flavoring, malt extract, or malt syrup.  Hydrolyzed vegetable protein.  In the [place] packaged foods that are gluten-free are required to be labeled "GF." These foods should be easy to identify and are safe to eat. In the [place] food companies are also required to list common food allergens, including wheat, on their labels. Recommended foods Grains  Amaranth, bean flours, 100% buckwheat flour, corn, millet, nut flours or nut meals, GF oats, quinoa, rice, sorghum, teff, rice wafers, pure cornmeal tortillas, popcorn, and hot cereals made from cornmeal. Hominy, rice, wild rice. Some Asian rice noodles or bean noodles. Arrowroot starch, corn bran, corn flour, corn germ, cornmeal, corn starch, potato flour, potato starch flour, and rice bran. Plain, brown, and sweet rice flours. Rice polish, soy flour, and tapioca starch. Vegetables  All plain fresh, frozen, and canned vegetables. Fruits  All plain fresh, frozen, canned, and dried fruits, and 100% fruit juices. Meats and other protein foods  All fresh beef, pork, poultry, fish, seafood, and eggs. Fish canned in water, oil, brine, or vegetable broth. Plain nuts and seeds, peanut butter. Some lunch meat and some frankfurters. Dried beans, dried peas, and lentils. Dairy  Fresh plain, dry, evaporated, or condensed milk. Cream, butter, sour cream, whipping cream, and most yogurts. Unprocessed cheese, most processed cheeses, some cottage cheese, some cream cheeses. Beverages  Coffee, tea, most herbal teas. Carbonated beverages and some root beers. Wine, sake, and distilled spirits, such as gin, vodka, and whiskey. Most hard ciders. Fats and  oils  Butter, margarine, vegetable oil, hydrogenated butter, olive oil, shortening, lard, cream, and some mayonnaise. Some commercial salad dressings. Olives. Sweets and desserts  Sugar, honey, some syrups, molasses, jelly, and jam. Plain hard candy, marshmallows, and gumdrops. Pure cocoa powder. Plain chocolate. Custard and some pudding mixes. Gelatin desserts, sorbets, frozen ice pops, and sherbet. Cake, cookies, and other desserts prepared with allowed flours. Some commercial ice creams. Cornstarch, tapioca, and rice puddings. Seasoning and other foods  Some canned or frozen soups. Monosodium glutamate (MSG). Cider, rice, and wine vinegar. Baking soda and baking powder. Cream of tartar. Baking and nutritional yeast. Certain soy sauces made without wheat (ask your dietitian about specific brands that are allowed). Nuts, coconut, and chocolate. Salt, pepper, herbs, spices, flavoring extracts, imitation or artificial flavorings, natural flavorings, and food colorings. Some medicines and supplements. Some lip glosses and other cosmetics. Rice syrups. The items listed may not be a complete list. Talk with your dietitian about what dietary choices are best for you. Foods to avoid Grains  Barley, bran, bulgur, couscous, cracked wheat, [place], farro, graham, malt, matzo, semolina, wheat germ, and all wheat and rye cereals including spelt and kamut. Cereals containing malt as a flavoring, such as rice cereal. Noodles, spaghetti, macaroni, most packaged rice mixes, and all mixes containing wheat, rye, barley, or triticale. Vegetables  Most creamed vegetables and most vegetables canned in sauces. Some commercially prepared vegetables and salads. Fruits  Thickened or prepared  fruits and some pie fillings. Some fruit snacks and fruit roll-ups. Meats and other protein foods  Any meat or meat alternative containing wheat, rye, barley, or gluten stabilizers. These are often marinated or packaged meats and  lunch meats. Bread-containing products, such as Swiss steak, croquettes, meatballs, and meatloaf. Most tuna canned in vegetable broth and [place] with hydrolyzed vegetable protein (HVP) injected as part of the basting. Seitan. Imitation fish. Eggs in sauces made from ingredients to avoid. Dairy  Commercial chocolate milk drinks and malted milk. Some non-dairy creamers. Any cheese product containing ingredients to avoid. Beverages  Certain cereal beverages. Beer, ale, malted milk, and some root beers. Some hard ciders. Some instant flavored coffees. Some herbal teas made with barley or with barley malt added. Fats and oils  Some commercial salad dressings. Sour cream containing modified food starch. Sweets and desserts  Some toffees. Chocolate-coated nuts (may be rolled in wheat flour) and some commercial candies and candy bars. Most cakes, cookies, donuts, pastries, and other baked goods. Some commercial ice cream. Ice cream cones. Commercially prepared mixes for cakes, cookies, and other desserts. Bread pudding and other puddings thickened with flour. Products containing brown rice syrup made with barley malt enzyme. Desserts and sweets made with malt flavoring. Seasoning and other foods  Some curry powders, some dry seasoning mixes, some gravy extracts, some meat sauces, some ketchups, some prepared mustards, and horseradish. Certain soy sauces. Malt vinegar. Bouillon and bouillon cubes that contain HVP. Some chip dips, and some chewing gum. Yeast extract. Brewer's yeast. Caramel color. Some medicines and supplements. Some lip glosses and other cosmetics. The items listed may not be a complete list. Talk with your dietitian about what dietary choices are best for you. Summary  Gluten is a protein that is found in wheat, rye, barley, and some other grains. The gluten-free diet includes all foods that do not contain gluten.  If you need help finding gluten-free foods or if you have questions,  talk with your diet and nutrition specialist (registered dietitian) or your health care provider.  Read all food labels. Gluten is often added to foods. Always check the ingredient list and look for warnings, such as "may contain gluten." This information is not intended to replace advice given to you by your health care provider. Make sure you discuss any questions you have with your health care provider. Document Released: 11/04/2005 Document Revised: 08/19/2016 Document Reviewed: 08/19/2016 Elsevier Interactive Patient Education  2018 [employer].  Follow-up with the gastroenterologist and dietitian as planned

## 2017-12-03 NOTE — Progress Notes (Signed)
Subjective:    Patient ID: Misty [name], female    DOB: [birth year], 77 y.o.   MRN: [id number]  HPI Pt here for follow up and management of chronic medical problems which includes hyperlipidemia. She is taking medication regularly.  The patient has been to see the gastroenterologist and has recently had a colonoscopy and endoscopy which indicates the possibility of gluten intolerance.  She is hoping that they are on to something to help stimulate her appetite.  She has been very patient with the gastroenterologist and he has been very thorough and his care of her.  She is trying to pursue a gluten free diet.  She is also concerned about a mole on her right wrist.  In reviewing the patient's X-rays, she did have a CT scan in April 2017.  It showed a pulmonary parenchymal mole pattern of fibrosis which was considered to be possibly progressive.  It also showed coronary artery calcification.  Her last biopsy was done on her intestinal track on 10/16/2017 and showed changes typical of celiac disease and her need to be on a gluten-free diet.  The endoscopy results were reviewed from Dr. Laural Golden.  The patient denies any chest pain or shortness of breath.  She seems to be in better spirits today.  She still has her frequent bowel movements and occasional vomiting.  The bowel movements are not watery.  She is hoping that the gluten-free diet that she will discussed with the dietitian will help control some of the abdominal problems she has had over the years.   Patient Active Problem List   Diagnosis Date Noted  . Non-intractable vomiting with nausea 10/06/2017  . Malnutrition of moderate degree 01/29/2016  . Symptomatic anemia 01/27/2016  . Generalized weakness 01/27/2016  . Abnormal chest x-ray 01/27/2016  . Tobacco use disorder 01/27/2016  . Colitis   . Hematochezia 02/26/2015  . Ischemic colitis ([place]) 02/26/2015  . Orthostatic hypotension 02/26/2015  . Non-traumatic compression fracture of T11  thoracic vertebra (HCC) 07/08/2014  . Hyperlipidemia 08/19/2013  . Fibrocystic breast disease 08/19/2013  . History of migraine headaches 08/19/2013  . GERD (gastroesophageal reflux disease) 04/07/2013  . Osteoporosis, postmenopausal 04/07/2013   Outpatient Encounter Medications as of 12/03/2017  Medication Sig  . acetaminophen (TYLENOL) 500 MG tablet Take 500 mg by mouth every 6 (six) hours as needed for moderate pain or headache.   . ALPRAZolam (XANAX) 0.5 MG tablet TAKE  (1)  TABLET  THREE TIMES DAILY AS NEEDED.  . cholecalciferol (VITAMIN D) 1000 UNITS tablet Take 2,000-4,000 Units by mouth daily. Take 2000 units daily except take 4000 units on Saturday and Sunday.  . escitalopram (LEXAPRO) 10 MG tablet Take 2 tablets (20 mg total) by mouth at bedtime.  . FeFum-FePoly-FA-B Cmp-C-Biot (INTEGRA PLUS) CAPS Take 1 capsule by mouth daily.  Marland Kitchen loperamide (IMODIUM) 2 MG capsule Take 1 capsule (2 mg total) by mouth daily as needed for diarrhea or loose stools.  . metoCLOPramide (REGLAN) 10 MG tablet Take 1 tablet (10 mg total) by mouth 4 (four) times daily.  . Multiple Vitamin (MULTIVITAMIN WITH MINERALS) TABS tablet Take 1 tablet by mouth daily. Centrum Silver  . pantoprazole (PROTONIX) 40 MG tablet Take 1 tablet 2 (two) times daily before a meal.  . polyethylene glycol powder (GLYCOLAX/MIRALAX) powder Take 8.5 g by mouth daily. (Patient taking differently: Take 8.5 g by mouth daily as needed (for constipation.). )  . ranitidine (ZANTAC) 300 MG tablet Take 300 mg by mouth daily as  needed for heartburn.   . vitamin B-12 (CYANOCOBALAMIN) 1000 MCG tablet Take 1,000 mcg by mouth daily.   No facility-administered encounter medications on file as of 12/03/2017.       Review of Systems  Constitutional: Negative.   HENT: Negative.   Eyes: Negative.   Respiratory: Negative.   Cardiovascular: Negative.   Gastrointestinal: Negative.   Endocrine: Negative.   Genitourinary: Negative.     Musculoskeletal: Negative.   Skin: Negative.        Mole under right breast - color change  Allergic/Immunologic: Negative.   Neurological: Negative.   Hematological: Negative.   Psychiatric/Behavioral: Negative.        Objective:   Physical Exam  Constitutional: She is oriented to person, place, and time. No distress.  Small framed and thin.  Patient alert.  HENT:  [name] Normocephalic and atraumatic.  Right Ear: External ear normal.  Left Ear: External ear normal.  Mouth/Throat: Oropharynx is clear and moist. No oropharyngeal exudate.  Congestion bilaterally  Eyes: Conjunctivae and EOM are normal. Pupils are equal, round, and reactive to light. Right eye exhibits no discharge. Left eye exhibits no discharge. No scleral icterus.  Needs eye exam and patient will arrange this  Neck: Normal range of motion. Neck supple. No thyromegaly present.  No bruits thyromegaly or anterior cervical adenopathy  Cardiovascular: Normal rate, regular rhythm, normal heart sounds and intact distal pulses.  No murmur heard. Heart has a regular rate and rhythm at 72/min  Pulmonary/Chest: Effort normal. No respiratory distress. She has no wheezes. She has no rales.  Scattered expiratory wheezes bilaterally.  Review of CT scan from late 2017 shows fibrotic changes in lungs that had progressed from 2016.  Abdominal: Soft. Bowel sounds are normal. She exhibits no mass. There is tenderness. There is no rebound and no guarding.  Generalized abdominal tenderness without masses or organ enlargement  Musculoskeletal: Normal range of motion. She exhibits no edema.  Lymphadenopathy:    She has no cervical adenopathy.  Neurological: She is alert and oriented to person, place, and time. She has normal reflexes. No cranial nerve deficit.  Skin: Skin is warm and dry. No rash noted.  Large pedunculated seborrheic keratosis Neath the bra strap beneath the right breast.  She will return to the office to have this  excised.  Psychiatric: She has a normal mood and affect. Her behavior is normal. Judgment and thought content normal.  The patient seems to have a more positive attitude today.  Nursing note and vitals reviewed.  BP 112/63 (BP Location: Left Arm)   Pulse 85   Temp 98.2 F (36.8 C) (Oral)   Ht 5' 7" (1.702 m)   Wt 123 lb (55.8 kg)   BMI 19.26 kg/m         Assessment & Plan:  1. Pure hypercholesterolemia -Continue with therapeutic lifestyle changes - BMP8+EGFR - CBC with Differential/Platelet - Lipid panel - Hepatic function panel  2. Vitamin D deficiency -Continue with vitamin D replacement pending results of lab work - CBC with Differential/Platelet - VITAMIN D 25 Hydroxy (Vit-D Deficiency, Fractures)  3. Gastroesophageal reflux disease, esophagitis presence not specified -Continue with pantoprazole - CBC with Differential/Platelet - Hepatic function panel  4. Pulmonary fibrosis ([place]) -Patient has ongoing wheezes and should have repeat CT scan because of fibrotic changes from previous CT in 2017 - CT Chest Wo Contrast; Future  5. Inflamed seborrheic keratosis -This appears to be a pedunculated nevus beneath the right breast under the bra strap  and she will return to the office to have this excised  6. Gluten intolerance -Follow-up with dietitian as planned  7. Abnormal chest CT -Get repeat chest CT because of fibrotic changes  8. Ischemic colitis [facility]) -Follow-up with gastroenterology as planned   Meds ordered this encounter  Medications  . ALPRAZolam (XANAX) 0.5 MG tablet    Sig: TAKE  (1)  TABLET  THREE TIMES DAILY AS NEEDED.    Dispense:  90 tablet    Refill:  4    Patient Instructions                       Medicare Annual Wellness Visit  [facility] and the medical providers at [facility] strive to bring you the best medical care.  In doing so we not only want to address your current medical conditions and concerns but also  to detect new conditions early and prevent illness, disease and health-related problems.    Medicare offers a yearly Wellness Visit which allows our clinical staff to assess your need for preventative services including immunizations, lifestyle education, counseling to decrease risk of preventable diseases and screening for fall risk and other medical concerns.    This visit is provided free of charge (no copay) for all Medicare recipients. The clinical pharmacists at [facility] have begun to conduct these Wellness Visits which will also include a thorough review of all your medications.    As you primary medical provider recommend that you make an appointment for your Annual Wellness Visit if you have not done so already this year.  You may set up this appointment before you leave today or you may call back ([phone]) and schedule an appointment.  Please make sure when you call that you mention that you are scheduling your Annual Wellness Visit with the clinical pharmacist so that the appointment may be made for the proper length of time.    Continue current medications. Continue good therapeutic lifestyle changes which include good diet and exercise. Fall precautions discussed with patient. If an FOBT was given today- please return it to our front desk. If you are over 59 years old - you may need Prevnar 36 or the adult Pneumonia vaccine.  **Flu shots are available--- please call and schedule a FLU-CLINIC appointment**  After your visit with [place] today you will receive a survey in the mail or online from [employer] regarding your care with [place]. Please take a moment to fill this out. Your feedback is very important to [place] as you can help [place] better understand your patient needs as well as improve your experience and satisfaction. WE CARE ABOUT YOU!!!      Gluten-Free Diet for Celiac Disease, Adult The gluten-free diet includes all foods that do not contain gluten. Gluten is  a protein that is found in wheat, rye, barley, and some other grains. Following the gluten-free diet is the only treatment for people with celiac disease. It helps to prevent damage to the intestines and improves or eliminates the symptoms of celiac disease. Following the gluten-free diet requires some planning. It can be challenging at first, but it gets easier with time and practice. There are more gluten-free options available today than ever before. If you need help finding gluten-free foods or if you have questions, talk with your diet and nutrition specialist (registered dietitian) or your health care provider. What do I need to know about a gluten-free diet?  All fruits,  vegetables, and meats are safe to eat and do not contain gluten.  When grocery shopping, start by shopping in the produce, meat, and dairy sections. These sections are more likely to contain gluten-free foods. Then move to the aisles that contain packaged foods if you need to.  Read all food labels. Gluten is often added to foods. Always check the ingredient list and look for warnings, such as "may contain gluten."  Talk with your dietitian or health care provider before taking a gluten-free multivitamin or mineral supplement.  Be aware of gluten-free foods having contact with foods that contain gluten (cross-contamination). This can happen at home and with any processed foods. ? Talk with your health care provider or dietitian about how to reduce the risk of cross-contamination in your home. ? If you have questions about how a food is processed, ask the manufacturer. What key words help to identify gluten? Foods that list any of these key words on the label usually contain gluten:  Wheat, flour, enriched flour, bromated flour, white flour, durum flour, graham flour, phosphated flour, self-rising flour, semolina, farina, barley (malt), rye, and oats.  Starch, dextrin, modified food starch, or cereal.  Thickening,  fillers, or emulsifiers.  Malt flavoring, malt extract, or malt syrup.  Hydrolyzed vegetable protein.  In the [place] packaged foods that are gluten-free are required to be labeled "GF." These foods should be easy to identify and are safe to eat. In the [place] food companies are also required to list common food allergens, including wheat, on their labels. Recommended foods Grains  Amaranth, bean flours, 100% buckwheat flour, corn, millet, nut flours or nut meals, GF oats, quinoa, rice, sorghum, teff, rice wafers, pure cornmeal tortillas, popcorn, and hot cereals made from cornmeal. Hominy, rice, wild rice. Some Asian rice noodles or bean noodles. Arrowroot starch, corn bran, corn flour, corn germ, cornmeal, corn starch, potato flour, potato starch flour, and rice bran. Plain, brown, and sweet rice flours. Rice polish, soy flour, and tapioca starch. Vegetables  All plain fresh, frozen, and canned vegetables. Fruits  All plain fresh, frozen, canned, and dried fruits, and 100% fruit juices. Meats and other protein foods  All fresh beef, pork, poultry, fish, seafood, and eggs. Fish canned in water, oil, brine, or vegetable broth. Plain nuts and seeds, peanut butter. Some lunch meat and some frankfurters. Dried beans, dried peas, and lentils. Dairy  Fresh plain, dry, evaporated, or condensed milk. Cream, butter, sour cream, whipping cream, and most yogurts. Unprocessed cheese, most processed cheeses, some cottage cheese, some cream cheeses. Beverages  Coffee, tea, most herbal teas. Carbonated beverages and some root beers. Wine, sake, and distilled spirits, such as gin, vodka, and whiskey. Most hard ciders. Fats and oils  Butter, margarine, vegetable oil, hydrogenated butter, olive oil, shortening, lard, cream, and some mayonnaise. Some commercial salad dressings. Olives. Sweets and desserts  Sugar, honey, some syrups, molasses, jelly, and jam. Plain hard candy, marshmallows, and gumdrops.  Pure cocoa powder. Plain chocolate. Custard and some pudding mixes. Gelatin desserts, sorbets, frozen ice pops, and sherbet. Cake, cookies, and other desserts prepared with allowed flours. Some commercial ice creams. Cornstarch, tapioca, and rice puddings. Seasoning and other foods  Some canned or frozen soups. Monosodium glutamate (MSG). Cider, rice, and wine vinegar. Baking soda and baking powder. Cream of tartar. Baking and nutritional yeast. Certain soy sauces made without wheat (ask your dietitian about specific brands that are allowed). Nuts, coconut, and chocolate. Salt, pepper, herbs, spices, flavoring extracts, imitation or artificial flavorings,  natural flavorings, and food colorings. Some medicines and supplements. Some lip glosses and other cosmetics. Rice syrups. The items listed may not be a complete list. Talk with your dietitian about what dietary choices are best for you. Foods to avoid Grains  Barley, bran, bulgur, couscous, cracked wheat, [place], farro, graham, malt, matzo, semolina, wheat germ, and all wheat and rye cereals including spelt and kamut. Cereals containing malt as a flavoring, such as rice cereal. Noodles, spaghetti, macaroni, most packaged rice mixes, and all mixes containing wheat, rye, barley, or triticale. Vegetables  Most creamed vegetables and most vegetables canned in sauces. Some commercially prepared vegetables and salads. Fruits  Thickened or prepared fruits and some pie fillings. Some fruit snacks and fruit roll-ups. Meats and other protein foods  Any meat or meat alternative containing wheat, rye, barley, or gluten stabilizers. These are often marinated or packaged meats and lunch meats. Bread-containing products, such as Swiss steak, croquettes, meatballs, and meatloaf. Most tuna canned in vegetable broth and [place] with hydrolyzed vegetable protein (HVP) injected as part of the basting. Seitan. Imitation fish. Eggs in sauces made from ingredients to  avoid. Dairy  Commercial chocolate milk drinks and malted milk. Some non-dairy creamers. Any cheese product containing ingredients to avoid. Beverages  Certain cereal beverages. Beer, ale, malted milk, and some root beers. Some hard ciders. Some instant flavored coffees. Some herbal teas made with barley or with barley malt added. Fats and oils  Some commercial salad dressings. Sour cream containing modified food starch. Sweets and desserts  Some toffees. Chocolate-coated nuts (may be rolled in wheat flour) and some commercial candies and candy bars. Most cakes, cookies, donuts, pastries, and other baked goods. Some commercial ice cream. Ice cream cones. Commercially prepared mixes for cakes, cookies, and other desserts. Bread pudding and other puddings thickened with flour. Products containing brown rice syrup made with barley malt enzyme. Desserts and sweets made with malt flavoring. Seasoning and other foods  Some curry powders, some dry seasoning mixes, some gravy extracts, some meat sauces, some ketchups, some prepared mustards, and horseradish. Certain soy sauces. Malt vinegar. Bouillon and bouillon cubes that contain HVP. Some chip dips, and some chewing gum. Yeast extract. Brewer's yeast. Caramel color. Some medicines and supplements. Some lip glosses and other cosmetics. The items listed may not be a complete list. Talk with your dietitian about what dietary choices are best for you. Summary  Gluten is a protein that is found in wheat, rye, barley, and some other grains. The gluten-free diet includes all foods that do not contain gluten.  If you need help finding gluten-free foods or if you have questions, talk with your diet and nutrition specialist (registered dietitian) or your health care provider.  Read all food labels. Gluten is often added to foods. Always check the ingredient list and look for warnings, such as "may contain gluten." This information is not intended to replace  advice given to you by your health care provider. Make sure you discuss any questions you have with your health care provider. Document Released: 11/04/2005 Document Revised: 08/19/2016 Document Reviewed: 08/19/2016 Elsevier Interactive Patient Education  2018 [place] with the gastroenterologist and dietitian as planned   Arrie Senate MD

## 2017-12-04 ENCOUNTER — Other Ambulatory Visit: Payer: Medicare Other

## 2017-12-04 DIAGNOSIS — D508 Other iron deficiency anemias: Secondary | ICD-10-CM

## 2017-12-04 LAB — CBC WITH DIFFERENTIAL/PLATELET
Basophils Absolute: 0 10*3/uL (ref 0.0–0.2)
Basos: 1 %
EOS (ABSOLUTE): 0.3 10*3/uL (ref 0.0–0.4)
Eos: 4 %
HEMOGLOBIN: 8.2 g/dL — AB (ref 11.1–15.9)
Hematocrit: 27.1 % — ABNORMAL LOW (ref 34.0–46.6)
IMMATURE GRANS (ABS): 0 10*3/uL (ref 0.0–0.1)
IMMATURE GRANULOCYTES: 0 %
LYMPHS: 18 %
Lymphocytes Absolute: 1.3 10*3/uL (ref 0.7–3.1)
MCH: 24.6 pg — AB (ref 26.6–33.0)
MCHC: 30.3 g/dL — ABNORMAL LOW (ref 31.5–35.7)
MCV: 81 fL (ref 79–97)
MONOCYTES: 8 %
Monocytes Absolute: 0.5 10*3/uL (ref 0.1–0.9)
NEUTROS ABS: 5 10*3/uL (ref 1.4–7.0)
NEUTROS PCT: 69 %
PLATELETS: 256 10*3/uL (ref 150–379)
RBC: 3.34 x10E6/uL — AB (ref 3.77–5.28)
RDW: 16.4 % — ABNORMAL HIGH (ref 12.3–15.4)
WBC: 7.1 10*3/uL (ref 3.4–10.8)

## 2017-12-04 LAB — VITAMIN D 25 HYDROXY (VIT D DEFICIENCY, FRACTURES): Vit D, 25-Hydroxy: 42.3 ng/mL (ref 30.0–100.0)

## 2017-12-04 LAB — BMP8+EGFR
BUN/Creatinine Ratio: 12 (ref 12–28)
BUN: 11 mg/dL (ref 8–27)
CALCIUM: 9 mg/dL (ref 8.7–10.3)
CO2: 25 mmol/L (ref 20–29)
Chloride: 100 mmol/L (ref 96–106)
Creatinine, Ser: 0.9 mg/dL (ref 0.57–1.00)
GFR calc non Af Amer: 62 mL/min/{1.73_m2} (ref >59)
GFR, EST AFRICAN AMERICAN: 72 mL/min/{1.73_m2} (ref >59)
Glucose: 110 mg/dL — ABNORMAL HIGH (ref 65–99)
Potassium: 4.3 mmol/L (ref 3.5–5.2)
Sodium: 139 mmol/L (ref 134–144)

## 2017-12-04 LAB — LIPID PANEL
CHOLESTEROL TOTAL: 154 mg/dL (ref 100–199)
Chol/HDL Ratio: 2.4 ratio (ref 0.0–4.4)
HDL: 64 mg/dL (ref >39)
LDL CALC: 71 mg/dL (ref 0–99)
TRIGLYCERIDES: 93 mg/dL (ref 0–149)
VLDL Cholesterol Cal: 19 mg/dL (ref 5–40)

## 2017-12-04 LAB — HEPATIC FUNCTION PANEL
ALK PHOS: 104 IU/L (ref 39–117)
ALT: 7 IU/L (ref 0–32)
AST: 13 IU/L (ref 0–40)
Albumin: 4 g/dL (ref 3.5–4.8)
BILIRUBIN, DIRECT: 0.08 mg/dL (ref 0.00–0.40)
Bilirubin Total: <0.2 mg/dL (ref 0.0–1.2)
TOTAL PROTEIN: 6.7 g/dL (ref 6.0–8.5)

## 2017-12-05 ENCOUNTER — Telehealth: Payer: Self-pay | Admitting: *Deleted

## 2017-12-05 LAB — CBC WITH DIFFERENTIAL/PLATELET
BASOS: 0 %
Basophils Absolute: 0 10*3/uL (ref 0.0–0.2)
EOS (ABSOLUTE): 0.3 10*3/uL (ref 0.0–0.4)
EOS: 5 %
HEMATOCRIT: 27.8 % — AB (ref 34.0–46.6)
HEMOGLOBIN: 8.3 g/dL — AB (ref 11.1–15.9)
IMMATURE GRANULOCYTES: 0 %
Immature Grans (Abs): 0 10*3/uL (ref 0.0–0.1)
LYMPHS ABS: 1.3 10*3/uL (ref 0.7–3.1)
Lymphs: 19 %
MCH: 24.6 pg — ABNORMAL LOW (ref 26.6–33.0)
MCHC: 29.9 g/dL — ABNORMAL LOW (ref 31.5–35.7)
MCV: 82 fL (ref 79–97)
MONOS ABS: 0.6 10*3/uL (ref 0.1–0.9)
Monocytes: 8 %
NEUTROS PCT: 68 %
Neutrophils Absolute: 4.5 10*3/uL (ref 1.4–7.0)
Platelets: 265 10*3/uL (ref 150–379)
RBC: 3.38 x10E6/uL — ABNORMAL LOW (ref 3.77–5.28)
RDW: 16.5 % — AB (ref 12.3–15.4)
WBC: 6.7 10*3/uL (ref 3.4–10.8)

## 2017-12-05 NOTE — Telephone Encounter (Signed)
-----   Message from Chipper Herb, MD sent at 12/05/2017  8:20 AM EST ----- The hemoglobin remains decreased.  The value is 8.3 and this is about what it was a couple of days ago.  The platelet count is normal and the white blood cell count is normal. Please do a referral to hematology because of this persistent anemia.  This visit should be scheduled in [place].

## 2017-12-05 NOTE — Telephone Encounter (Signed)
Pt notified of results Per pt, she has not been taking iron.  Pt wants to try taking Iron to see if this helps anemia Pt does not want to see hematology until trying Iron

## 2017-12-15 ENCOUNTER — Encounter: Payer: Self-pay | Admitting: Family Medicine

## 2017-12-15 ENCOUNTER — Ambulatory Visit (INDEPENDENT_AMBULATORY_CARE_PROVIDER_SITE_OTHER): Payer: Medicare Other | Admitting: Family Medicine

## 2017-12-15 VITALS — BP 155/76 | HR 85 | Temp 97.1°F | Ht 67.0 in | Wt 131.0 lb

## 2017-12-15 DIAGNOSIS — L820 Inflamed seborrheic keratosis: Secondary | ICD-10-CM

## 2017-12-15 NOTE — Progress Notes (Signed)
   Subjective:    Patient ID: Misty [name], female    DOB: 1941/09/09, 77 y.o.   MRN: [id number]  HPI Pt here for shaved biopsy of lesion under R breast.      Review of Systems     Objective:   Physical Exam BP (!) 155/76 (BP Location: Left Arm, Patient Position: Sitting, Cuff Size: Normal)   Pulse 85   Temp (!) 97.1 F (36.2 C) (Oral)   Ht 5\' 7"  (1.702 m)   Wt 131 lb (59.4 kg)   BMI 20.52 kg/m         Assessment & Plan:

## 2017-12-15 NOTE — Patient Instructions (Signed)
Keep the area clean and dry Return to the office in 4-6 weeks for 1 more treatment If any signs of redness or infection the patient should call back sooner. Use a sterile piece of gauze over the lesion to keep it from getting irritated.

## 2017-12-15 NOTE — Progress Notes (Signed)
Patient was scheduled for seborrheic keratosis excision but this was partially avulsed by the patient and is very flat now.  Is located beneath the right breast and under the bra strap.  The remainder of the seborrheic keratosis today was treated with cryotherapy and patient tolerated the procedure well.  BP (!) 155/76 (BP Location: Left Arm, Patient Position: Sitting, Cuff Size: Normal)   Pulse 85   Temp (!) 97.1 F (36.2 C) (Oral)   Ht 5\' 7"  (1.702 m)   Wt 131 lb (59.4 kg)   BMI 20.52 kg/m     1. Seborrheic keratosis, inflamed -Cryotherapy to remaining seborrheic keratosis beneath right breast and patient tolerated the procedure well.  Patient Instructions  Keep the area clean and dry Return to the office in 4-6 weeks for 1 more treatment If any signs of redness or infection the patient should call back sooner. Use a sterile piece of gauze over the lesion to keep it from getting irritated.  Nyra Capes MD

## 2017-12-18 ENCOUNTER — Other Ambulatory Visit (INDEPENDENT_AMBULATORY_CARE_PROVIDER_SITE_OTHER): Payer: Self-pay | Admitting: Internal Medicine

## 2017-12-29 ENCOUNTER — Ambulatory Visit: Payer: Medicare Other | Admitting: Nutrition

## 2018-01-14 ENCOUNTER — Encounter: Payer: Self-pay | Admitting: Family Medicine

## 2018-01-14 ENCOUNTER — Ambulatory Visit (INDEPENDENT_AMBULATORY_CARE_PROVIDER_SITE_OTHER): Payer: Medicare Other | Admitting: Family Medicine

## 2018-01-14 VITALS — BP 120/60 | HR 93 | Temp 97.6°F | Ht 67.0 in | Wt 127.0 lb

## 2018-01-14 DIAGNOSIS — L820 Inflamed seborrheic keratosis: Secondary | ICD-10-CM

## 2018-01-14 NOTE — Progress Notes (Signed)
Subjective:    Patient ID: Misty [name], female    DOB: 1941/09/03, 77 y.o.   MRN: [id number]  HPI Patient here today for follow up on her right breast skin lesion.     Patient Active Problem List   Diagnosis Date Noted  . Non-intractable vomiting with nausea 10/06/2017  . Malnutrition of moderate degree 01/29/2016  . Symptomatic anemia 01/27/2016  . Generalized weakness 01/27/2016  . Abnormal chest x-ray 01/27/2016  . Tobacco use disorder 01/27/2016  . Colitis   . Hematochezia 02/26/2015  . Ischemic colitis (HCC) 02/26/2015  . Orthostatic hypotension 02/26/2015  . Non-traumatic compression fracture of T11 thoracic vertebra (HCC) 07/08/2014  . Hyperlipidemia 08/19/2013  . Fibrocystic breast disease 08/19/2013  . History of migraine headaches 08/19/2013  . GERD (gastroesophageal reflux disease) 04/07/2013  . Osteoporosis, postmenopausal 04/07/2013   Outpatient Encounter Medications as of 01/14/2018  Medication Sig  . acetaminophen (TYLENOL) 500 MG tablet Take 500 mg by mouth every 6 (six) hours as needed for moderate pain or headache.   . ALPRAZolam (XANAX) 0.5 MG tablet TAKE  (1)  TABLET  THREE TIMES DAILY AS NEEDED.  . cholecalciferol (VITAMIN D) 1000 UNITS tablet Take 2,000-4,000 Units by mouth daily. Take 2000 units daily except take 4000 units on Saturday and Sunday.  . escitalopram (LEXAPRO) 10 MG tablet Take 2 tablets (20 mg total) by mouth at bedtime.  . FeFum-FePoly-FA-B Cmp-C-Biot (INTEGRA PLUS) CAPS Take 1 capsule by mouth daily.  Marland Kitchen loperamide (IMODIUM) 2 MG capsule Take 1 capsule (2 mg total) by mouth daily as needed for diarrhea or loose stools.  . metoCLOPramide (REGLAN) 10 MG tablet Take 1 tablet (10 mg total) by mouth 4 (four) times daily.  . Multiple Vitamin (MULTIVITAMIN WITH MINERALS) TABS tablet Take 1 tablet by mouth daily. Centrum Silver  . pantoprazole (PROTONIX) 40 MG tablet Take 1 tablet 2 (two) times daily before a meal.  . polyethylene glycol  powder (GLYCOLAX/MIRALAX) powder Take 8.5 g by mouth daily. (Patient taking differently: Take 8.5 g by mouth daily as needed (for constipation.). )  . ranitidine (ZANTAC) 300 MG tablet Take 300 mg by mouth daily as needed for heartburn.   . vitamin B-12 (CYANOCOBALAMIN) 1000 MCG tablet Take 1,000 mcg by mouth daily.   No facility-administered encounter medications on file as of 01/14/2018.       Review of Systems  Constitutional: Negative.   HENT: Negative.   Eyes: Negative.   Respiratory: Negative.   Cardiovascular: Negative.   Gastrointestinal: Negative.   Endocrine: Negative.   Genitourinary: Negative.   Musculoskeletal: Negative.   Skin: Negative.        Lesion - right breast - still a small place there  Allergic/Immunologic: Negative.   Neurological: Negative.   Hematological: Negative.   Psychiatric/Behavioral: Negative.        Objective:   Physical Exam  BP 120/60 (BP Location: Left Arm)   Pulse 93   Temp 97.6 F (36.4 C) (Oral)   Ht 5\' 7"  (1.702 m)   Wt 127 lb (57.6 kg)   BMI 19.89 kg/m   Brief freezing today of the former lesion where there was a peripheral rim that and patient tolerated the procedure well.     Assessment & Plan:  1. Seborrheic keratosis, inflamed -We will recheck this at the next visit and the lesion itself appears much better and the area of cryotherapy appears to be well healed other than a small area at the upper edge of the  lesion which was refrozen today.  Patient Instructions  We will continue to monitor the lesion at your next visit. The results of the previous cryotherapy look like everything is much better and the irritation is mostly gone.  Nyra Capes MD

## 2018-01-14 NOTE — Patient Instructions (Signed)
We will continue to monitor the lesion at your next visit. The results of the previous cryotherapy look like everything is much better and the irritation is mostly gone.

## 2018-01-27 ENCOUNTER — Encounter (INDEPENDENT_AMBULATORY_CARE_PROVIDER_SITE_OTHER): Payer: Self-pay | Admitting: Internal Medicine

## 2018-01-27 ENCOUNTER — Ambulatory Visit (INDEPENDENT_AMBULATORY_CARE_PROVIDER_SITE_OTHER): Payer: Medicare Other | Admitting: Internal Medicine

## 2018-01-27 VITALS — BP 110/70 | HR 72 | Temp 99.0°F | Resp 18 | Ht 67.0 in | Wt 124.0 lb

## 2018-01-27 DIAGNOSIS — K900 Celiac disease: Secondary | ICD-10-CM

## 2018-01-27 DIAGNOSIS — D508 Other iron deficiency anemias: Secondary | ICD-10-CM | POA: Diagnosis not present

## 2018-01-27 DIAGNOSIS — K2100 Gastro-esophageal reflux disease with esophagitis, without bleeding: Secondary | ICD-10-CM

## 2018-01-27 DIAGNOSIS — R112 Nausea with vomiting, unspecified: Secondary | ICD-10-CM | POA: Diagnosis not present

## 2018-01-27 MED ORDER — PANTOPRAZOLE SODIUM 40 MG PO TBEC: 40.0000 mg | DELAYED_RELEASE_TABLET | Freq: Two times a day (BID) | ORAL | 5 refills | Status: DC

## 2018-01-27 NOTE — Progress Notes (Signed)
Presenting complaint;  Follow-up for chronic nausea vomiting GERD irregular bowel movements are not deficiency anemia and recent diagnosis of celiac disease.  Database and subjective:  Patient is 77 year old Caucasian female with multiple medical problems who is here for scheduled visit.  She was last seen on 09/30/2017 when she weighed 818 pounds.  Following the last visit she had testing for celiac disease normal.  She underwent EGD on 10/14/2017 revealing erosive reflux esophagitis small sliding hiatal hernia small amount of food debris in the stomach mild gastric antral vascular ectasia without bleeding and duodenal mucosal changes consistent with celiac disease.  She also had portal hypertensive gastropathy.  Biopsy from duodenum confirmed as of celiac disease. Gastric biopsy was negative for H. Pylori.  Dietary consultation was recommended.  Patient felt that she could manage a gluten-free diet on her own.  She has not seen the dietitian yet. She does not feel any better.  She had blood work by Dr. Laurance Flatten on 12/03/2017 her hemoglobin had dropped to 8.2.  This was repeated a day later to make sure this was not a lab that her hemoglobin was 8.3 g.  Hematology consultation has been requested. Patient states she takes iron pill every day.  She continues to complain of nausea and vomiting.  She has at least 2-3 episodes a week.  She says most of these vomiting spells occur without nausea.  Sometimes these spells are triggered by coughing spells.  Last episode was 2 days ago.  She denies hematemesis melena or rectal bleeding.  She says her appetite is fair.  She says her stools are loose she has anywhere from 3-5/day.  She may have a formed stool once a week.  She also complains of abdominal distention and bloating.  She states her weight has fluctuated between 124 and 131.  Today she weighed 124 pounds.  She does not feel metoclopramide is helping.  She is not having any side effects.  She is taking  MiraLAX sparingly. She needs a refill on pantoprazole.   Current Medications: Outpatient Encounter Medications as of 01/27/2018  Medication Sig  . acetaminophen (TYLENOL) 500 MG tablet Take 500 mg by mouth every 6 (six) hours as needed for moderate pain or headache.   . ALPRAZolam (XANAX) 0.5 MG tablet TAKE  (1)  TABLET  THREE TIMES DAILY AS NEEDED.  . cholecalciferol (VITAMIN D) 1000 UNITS tablet Take 2,000-4,000 Units by mouth daily. Take 2000 units daily except take 4000 units on Saturday and Sunday.  . escitalopram (LEXAPRO) 10 MG tablet Take 2 tablets (20 mg total) by mouth at bedtime.  . FeFum-FePoly-FA-B Cmp-C-Biot (INTEGRA PLUS) CAPS Take 1 capsule by mouth daily.  Marland Kitchen loperamide (IMODIUM) 2 MG capsule Take 1 capsule (2 mg total) by mouth daily as needed for diarrhea or loose stools.  . metoCLOPramide (REGLAN) 10 MG tablet Take 1 tablet (10 mg total) by mouth 4 (four) times daily.  . Multiple Vitamin (MULTIVITAMIN WITH MINERALS) TABS tablet Take 1 tablet by mouth daily. Centrum Silver  . pantoprazole (PROTONIX) 40 MG tablet Take 1 tablet 2 (two) times daily before a meal.  . polyethylene glycol powder (GLYCOLAX/MIRALAX) powder Take 8.5 g by mouth daily. (Patient taking differently: Take 8.5 g by mouth daily as needed (for constipation.). )  . ranitidine (ZANTAC) 300 MG tablet Take 300 mg by mouth daily as needed for heartburn.   . vitamin B-12 (CYANOCOBALAMIN) 1000 MCG tablet Take 1,000 mcg by mouth daily.   No facility-administered encounter medications on file  as of 01/27/2018.      Objective: Blood pressure 110/70, pulse 72, temperature 99 F (37.2 C), temperature source Oral, resp. rate 18, height 5' 7"  (1.702 m), weight 124 lb (56.2 kg). Patient is alert and in no acute distress. Conjunctiva is pale. Sclera is nonicteric Oropharyngeal mucosa is normal. No neck masses or thyromegaly noted. Cardiac exam with regular rhythm normal S1 and S2. No murmur or gallop noted. Lungs are  clear to auscultation. Abdomen is full.  Bowel sounds are normal.  On palpation abdomen is soft.  Lumpy masses in left lower quadrant as well as left upper quadrant freely mobile and felt to be fecal matter.  No organomegaly or masses. Rectal examination revealed formed stool in the rectum and was guaiac negative. No LE edema or clubbing noted.  Labs/studies Results:  Lab data from 12/04/2017 WBC 6.7, H&H 8.3 and 27.8 and MCV 82. Bilirubin less than 0.2, AP 104, AST 13, ALT 7, total protein 6.7 albumin 4.0. Vitamin D2 level is normal at 42.3.  H&H was 11.4 and 36.6 on 07/10/2017.   Assessment:  #1.  Chronic vomiting.  She has been on metoclopramide for presumed gastroparesis.  She has declined solid-phase gastric emptying study in the past.  She does not feel that metoclopramide is helping.  Therefore this medication will be discontinued.  Nausea and vomiting may be due to celiac disease and I do not believe dietary compliance 100%.  #2.  Erosive reflux esophagitis possibly related to chronic vomiting and GERD.  #3.  Anemia.  She has well-documented history of iron deficiency anemia.  She responded to parenteral iron in 2017.  I suspect she is not absorbing iron because of celiac disease.  She may need parenteral iron infusion again but first will document deficiency.  #4.  Irritable bowel syndrome.  She is having loose stools but she has formed stool in the rectum and and colon.   Plan:  Hemoccult x1. Discontinue metoclopramide. New prescription for pantoprazole 40 mg p.o. twice daily given. Patient will go to the lab for  serum iron TIBC ferritin B12 and folate levels as well as zinc. Dietary consultation for education regarding gluten-free diet. Office visit in 3 months.

## 2018-01-27 NOTE — Patient Instructions (Signed)
Can go back on metoclopramide if nausea and vomiting gets worse. Hemoccult x1.

## 2018-01-29 LAB — ZINC: ZINC: 79 ug/dL (ref 60–130)

## 2018-01-29 LAB — VITAMIN B12: VITAMIN B 12: 492 pg/mL (ref 200–1100)

## 2018-01-29 LAB — FOLATE

## 2018-01-29 LAB — FERRITIN: Ferritin: 30 ng/mL (ref 20–288)

## 2018-01-29 LAB — IRON, TOTAL/TOTAL IRON BINDING CAP
%SAT: 64 % — AB (ref 11–50)
Iron: 201 ug/dL — ABNORMAL HIGH (ref 45–160)
TIBC: 313 mcg/dL (calc) (ref 250–450)

## 2018-02-02 ENCOUNTER — Other Ambulatory Visit (INDEPENDENT_AMBULATORY_CARE_PROVIDER_SITE_OTHER): Payer: Self-pay | Admitting: *Deleted

## 2018-02-02 DIAGNOSIS — K900 Celiac disease: Secondary | ICD-10-CM

## 2018-02-02 DIAGNOSIS — D649 Anemia, unspecified: Secondary | ICD-10-CM

## 2018-02-23 DIAGNOSIS — Z961 Presence of intraocular lens: Secondary | ICD-10-CM | POA: Diagnosis not present

## 2018-02-23 DIAGNOSIS — H353131 Nonexudative age-related macular degeneration, bilateral, early dry stage: Secondary | ICD-10-CM | POA: Diagnosis not present

## 2018-02-23 DIAGNOSIS — H1820 Unspecified corneal edema: Secondary | ICD-10-CM | POA: Diagnosis not present

## 2018-02-23 DIAGNOSIS — H2512 Age-related nuclear cataract, left eye: Secondary | ICD-10-CM | POA: Diagnosis not present

## 2018-02-24 ENCOUNTER — Other Ambulatory Visit (INDEPENDENT_AMBULATORY_CARE_PROVIDER_SITE_OTHER): Payer: Self-pay | Admitting: *Deleted

## 2018-02-24 DIAGNOSIS — D508 Other iron deficiency anemias: Secondary | ICD-10-CM

## 2018-02-24 DIAGNOSIS — K900 Celiac disease: Secondary | ICD-10-CM

## 2018-02-27 ENCOUNTER — Other Ambulatory Visit: Payer: Medicare Other

## 2018-02-27 DIAGNOSIS — K900 Celiac disease: Secondary | ICD-10-CM | POA: Diagnosis not present

## 2018-02-27 DIAGNOSIS — D508 Other iron deficiency anemias: Secondary | ICD-10-CM | POA: Diagnosis not present

## 2018-02-28 LAB — HEMOGLOBIN AND HEMATOCRIT, BLOOD
HEMATOCRIT: 34 % (ref 34.0–46.6)
HEMOGLOBIN: 10.3 g/dL — AB (ref 11.1–15.9)

## 2018-03-05 ENCOUNTER — Other Ambulatory Visit: Payer: Self-pay | Admitting: Family Medicine

## 2018-03-05 ENCOUNTER — Ambulatory Visit (HOSPITAL_COMMUNITY)
Admission: RE | Admit: 2018-03-05 | Discharge: 2018-03-05 | Disposition: A | Discharge status: Home / Self Care | Payer: Medicare Other | Source: Ambulatory Visit | Attending: Family Medicine | Admitting: Family Medicine

## 2018-03-05 DIAGNOSIS — J8410 Pulmonary fibrosis, unspecified: Secondary | ICD-10-CM

## 2018-03-05 DIAGNOSIS — J439 Emphysema, unspecified: Secondary | ICD-10-CM | POA: Diagnosis not present

## 2018-03-09 DIAGNOSIS — Z01818 Encounter for other preprocedural examination: Secondary | ICD-10-CM | POA: Diagnosis not present

## 2018-03-09 DIAGNOSIS — H25812 Combined forms of age-related cataract, left eye: Secondary | ICD-10-CM | POA: Diagnosis not present

## 2018-03-11 ENCOUNTER — Other Ambulatory Visit: Payer: Self-pay | Admitting: *Deleted

## 2018-03-11 DIAGNOSIS — R9389 Abnormal findings on diagnostic imaging of other specified body structures: Secondary | ICD-10-CM

## 2018-03-11 DIAGNOSIS — I700 Atherosclerosis of aorta: Secondary | ICD-10-CM

## 2018-03-11 DIAGNOSIS — I25119 Atherosclerotic heart disease of native coronary artery with unspecified angina pectoris: Secondary | ICD-10-CM

## 2018-03-16 ENCOUNTER — Ambulatory Visit (INDEPENDENT_AMBULATORY_CARE_PROVIDER_SITE_OTHER): Payer: Medicare Other | Admitting: *Deleted

## 2018-03-16 VITALS — BP 113/63 | HR 82 | Temp 97.4°F | Ht 67.0 in | Wt 125.0 lb

## 2018-03-16 DIAGNOSIS — Z0000 Encounter for general adult medical examination without abnormal findings: Secondary | ICD-10-CM

## 2018-03-16 NOTE — Progress Notes (Addendum)
Subjective:   DAMESHA [name] is a 77 y.o. female who presents for Medicare Annual (Subsequent) preventive examination. She is retired from many years of [employer] -work. She enjoys spending time with her grandchildren and watching TV. She does not get to exercise due to her back problems. She states that she eats semi-healthy meals and usually only has 2 a day. She is not active in the church. She lives at home with her husband. She had one son who passed away 2 years ago. They have one dog that stays outside. She is aware of fall hazards and risks. She states that her health is a little worse than it was a year ago.       Objective:     Vitals: BP 113/63 (BP Location: Left Arm)   Pulse 82   Temp (!) 97.4 F (36.3 C) (Oral)   Ht 5\' 7"  (1.702 m)   Wt 125 lb (56.7 kg)   BMI 19.58 kg/m   Body mass index is 19.58 kg/m.  Advanced Directives 03/16/2018 10/14/2017 01/29/2016 01/27/2016 01/27/2016 01/27/2016 02/26/2015  Does Patient Have a Medical Advance Directive? No No No - No No No  Type of Advance Directive - - - - - - Medical sales representative in Chart? - - - - - - No - copy requested  Would patient like information on creating a medical advance directive? Yes (MAU/Ambulatory/Procedural Areas - Information given) No - Patient declined Yes - Spiritual care consult ordered Yes - Spiritual care consult ordered No - patient declined information No - patient declined information -  Pre-existing out of facility DNR order (yellow form or pink MOST form) - - - - - - -    Tobacco Social History   Tobacco Use  Smoking Status Current Some Day Smoker  . Packs/day: 1.00  . Years: 52.00  . Pack years: 52.00  . Types: Cigarettes  . Start date: 11/18/1958  . Last attempt to quit: 08/20/2011  . Years since quitting: 6.5  Smokeless Tobacco Never Used  Tobacco Comment   1/2-1pack off and on all her life     Ready to quit: Not Answered Counseling given: Not  Answered Comment: 1/2-1pack off and on all her life   Clinical Intake:                       Past Medical History:  Diagnosis Date  . Anxiety    takes Xanax daily  . Arthritis    back  . Cataract   . Chronic back pain   . Constipation    OTC stool softener prn  . COPD (chronic obstructive pulmonary disease) (HCC)   . Depression   . Diverticulosis   . GERD (gastroesophageal reflux disease)    takes Ranidine daily  . Hemorrhoids   . History of bronchitis   . History of migraine    many yrs ago  . History of shingles   . Hyperlipidemia   . Insomnia   . Migraines   . Nocturia   . Numbness    left leg  . Osteoporosis   . PONV (postoperative nausea and vomiting)   . Urinary frequency   . Urinary urgency    Past Surgical History:  Procedure Laterality Date  . ABDOMINAL HYSTERECTOMY    . COLONOSCOPY    . COLONOSCOPY N/A 02/17/2015   Procedure: COLONOSCOPY;  Surgeon: Malissa Hippo, MD;  [facility];  Service: Endoscopy;  Laterality: N/A;  110n - moved to 11:15 - Ann to notify pt  . ESOPHAGOGASTRODUODENOSCOPY    . ESOPHAGOGASTRODUODENOSCOPY N/A 01/29/2016   Procedure: ESOPHAGOGASTRODUODENOSCOPY (EGD);  Surgeon: Malissa Hippo, MD;  [facility]: [facility];  Service: Endoscopy;  Laterality: N/A;  . ESOPHAGOGASTRODUODENOSCOPY N/A 10/14/2017   Procedure: ESOPHAGOGASTRODUODENOSCOPY (EGD);  Surgeon: Malissa Hippo, MD;  Location: [facility];  Service: Endoscopy;  Laterality: N/A;  730  . EYE SURGERY Right 3/15   cataracts  . KYPHOPLASTY N/A 07/08/2014   Procedure: Thoracic Eleven Kyphoplasty;  Surgeon: Hewitt Shorts, MD;  Location: [facility];  Service: Neurosurgery;  Laterality: N/A;  . LUMBAR LAMINECTOMY/DECOMPRESSION MICRODISCECTOMY Bilateral 05/20/2013   Procedure: LUMBAR LAMINECTOMY/DECOMPRESSION MICRODISCECTOMY 1 LEVEL;  Surgeon: Hewitt Shorts, MD;  Location: [facility];  Service: Neurosurgery;  Laterality: Bilateral;  Bilateral  Lumbar four-five laminotomy and left lumbar four-five microdiskectomy   Family History  Problem Relation Age of Onset  . Hypertension Mother   . Macular degeneration Father   . Heart disease Father   . Cirrhosis Son   . Heart disease Son   . Colon cancer Neg Hx    Social History   Socioeconomic History  . Marital status: Married    Spouse name: Misty [name]   . Number of children: Not on file  . Years of education: Not on file  . Highest education level: Not on file  Occupational History  . 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 Financial resource strain: Not on file  . Food insecurity:    Worry: Not on file    Inability: Not on file  . Transportation needs:    Medical: Not on file    Non-medical: Not on file  Tobacco Use  . Smoking status: Current Some Day Smoker    Packs/day: 1.00    Years: 52.00    Pack years: 52.00    Types: Cigarettes    Start date: 11/18/1958    Last attempt to quit: 08/20/2011    Years since quitting: 6.5  . Smokeless tobacco: Never Used  . Tobacco comment: 1/2-1pack off and on all her life  Substance and Sexual Activity  . Alcohol use: No    Alcohol/week: 0.0 oz  . Drug use: No  . Sexual activity: Not Currently  Lifestyle  . Physical activity:    Days per week: Not on file    Minutes per session: Not on file  . Stress: Not on file  Relationships  . Social connections:    Talks on phone: Not on file    Gets together: Not on file    Attends religious service: Not on file    Active member of club or organization: Not on file    Attends meetings of clubs or organizations: Not on file    Relationship status: Not on file  Other Topics Concern  . Not on file  Social History Narrative   Lives at home with husband, Misty [name].   Had one son- now deceased     Outpatient Encounter Medications as of 03/16/2018  Medication Sig  . acetaminophen (TYLENOL) 500 MG tablet Take 500 mg by mouth every 6 (six) hours as  needed for moderate pain or headache.   . ALPRAZolam (XANAX) 0.5 MG tablet TAKE  (1)  TABLET  THREE TIMES DAILY AS NEEDED.  . cholecalciferol (VITAMIN D) 1000 UNITS tablet Take 2,000-4,000 Units by  mouth daily. Take 2000 units daily except take 4000 units on Saturday and Sunday.  . escitalopram (LEXAPRO) 10 MG tablet Take 2 tablets (20 mg total) by mouth at bedtime.  . Multiple Vitamin (MULTIVITAMIN WITH MINERALS) TABS tablet Take 1 tablet by mouth daily. Centrum Silver  . pantoprazole (PROTONIX) 40 MG tablet Take 1 tablet (40 mg total) by mouth 2 (two) times daily before a meal.  . polyethylene glycol powder (GLYCOLAX/MIRALAX) powder Take 8.5 g by mouth daily. (Patient taking differently: Take 8.5 g by mouth daily as needed (for constipation.). )  . ranitidine (ZANTAC) 300 MG tablet Take 300 mg by mouth daily as needed for heartburn.   . vitamin B-12 (CYANOCOBALAMIN) 1000 MCG tablet Take 1,000 mcg by mouth daily.  Marland Kitchen FeFum-FePoly-FA-B Cmp-C-Biot (INTEGRA PLUS) CAPS Take 1 capsule by mouth daily. (Patient not taking: Reported on 03/16/2018)  . loperamide (IMODIUM) 2 MG capsule Take 1 capsule (2 mg total) by mouth daily as needed for diarrhea or loose stools. (Patient not taking: Reported on 03/16/2018)   No facility-administered encounter medications on file as of 03/16/2018.     Activities of Daily Living In your present state of health, do you have any difficulty performing the following activities: 03/16/2018  Hearing? N  Vision? Y  Comment cataracts and Macular Degeneration   Difficulty concentrating or making decisions? N  Walking or climbing stairs? Y  Comment back pain   Dressing or bathing? N  Doing errands, shopping? N  Preparing Food and eating ? N  Using the Toilet? N  In the past six months, have you accidently leaked urine? Y  Do you have problems with loss of bowel control? N  Managing your Medications? N  Managing your Finances? N  Housekeeping or managing your Housekeeping?  N  Some recent data might be hidden    Patient Care Team: Ernestina Penna, MD as PCP - General (Family Medicine) Malissa Hippo, MD as Consulting Physician (Gastroenterology) Trey Sailors, MD as Attending Physician (Neurosurgery) Genia Del Daisy Blossom, MD as Consulting Physician (Ophthalmology)    Assessment:   This is a routine wellness examination for [place].  Exercise Activities and Dietary recommendations Current Exercise Habits: The patient does not participate in regular exercise at present  Goals    . Increase physical activity    . Prevent falls       Fall Risk Fall Risk  03/16/2018 01/14/2018 12/03/2017 07/10/2017 03/06/2017  Falls in the past year? No No No No No   Is the patient's home free of loose throw rugs in walkways, pet beds, electrical cords, etc?  Fall risk and hazards were discussed today   Depression screen PHQ 2/9 Scores 03/16/2018 01/14/2018 12/03/2017 07/10/2017  PHQ - 2 Score 0 0 0 0     Cognitive Function MMSE - Mini Mental State Exam 03/16/2018  Orientation to time 5  Orientation to Place 5  Registration 3  Attention/ Calculation 5  Recall 3  Language- name 2 objects 2  Language- repeat 1  Language- follow 3 step command 3  Language- read & follow direction 1  Write a sentence 1  Copy design 1  Total score 30        Immunization History  Administered Date(s) Administered  . Influenza, High Dose Seasonal PF 10/03/2016, 09/03/2017  . Influenza,inj,Quad PF,6+ Mos 08/19/2013, 09/20/2015  . Influenza,inj,quad, With Preservative 09/07/2014  . Pneumococcal Conjugate-13 12/16/2013  . Pneumococcal Polysaccharide-23 01/29/2010  . Tdap 07/18/2011    Qualifies for Shingles Vaccine? Refused  today   Screening Tests Health Maintenance  Topic Date Due  . PAP SMEAR  08/19/2011  . PNA vac Low Risk Adult (2 of 2 - PPSV23) 12/16/2014  . INFLUENZA VACCINE  06/18/2018  . MAMMOGRAM  11/25/2019  . TETANUS/TDAP  06/18/2021  . DEXA SCAN  Completed     Cancer Screenings: Lung: Low Dose CT Chest recommended if Age 19-80 years, 30 pack-year currently smoking OR have quit w/in 15years. Patient does qualify. Breast:  Up to date on Mammogram? Yes   Up to date of Bone Density/Dexa? No Colorectal: due at next OV  Additional Screenings: declined  Hepatitis C Screening:      Plan:   pt is to keep follow up with Dr Christell Constant and other specialist  She is to stay active and try to prevent falls. She is due for a DEXA and will do this at her next [facility] OV.   I have personally reviewed and noted the following in the patient's chart:   . Medical and social history . Use of alcohol, tobacco or illicit drugs  . Current medications and supplements . Functional ability and status . Nutritional status . Physical activity . Advanced directives . List of other physicians . Hospitalizations, surgeries, and ER visits in previous 12 months . Vitals . Screenings to include cognitive, depression, and falls . Referrals and appointments  In addition, I have reviewed and discussed with patient certain preventive protocols, quality metrics, and best practice recommendations. A written personalized care plan for preventive services as well as general preventive health recommendations were provided to patient.     Lum Stillinger, Almond Lint, LPN  07/12/538   I have reviewed and agree with the above AWV documentation.   Murtis Sink, MD [facility] 03/16/2018, 11:49 AM

## 2018-03-16 NOTE — Patient Instructions (Signed)
  Misty [name] , Thank you for taking time to come for your Medicare Wellness Visit. I appreciate your ongoing commitment to your health goals. Please review the following plan we discussed and let me know if I can assist you in the future.   These are the goals we discussed: Goals    . Increase physical activity    . Prevent falls       This is a list of the screening recommended for you and due dates:  Health Maintenance  Topic Date Due  . Pap Smear  08/19/2011  . Pneumonia vaccines (2 of 2 - PPSV23) 12/16/2014  . Flu Shot  06/18/2018  . Mammogram  11/25/2019  . Tetanus Vaccine  06/18/2021  . DEXA scan (bone density measurement)  Completed     Keep follow up with Dr Christell Constant and other specialist Be careful not to fall // stay active

## 2018-03-26 DIAGNOSIS — Z961 Presence of intraocular lens: Secondary | ICD-10-CM | POA: Diagnosis not present

## 2018-03-26 DIAGNOSIS — H2512 Age-related nuclear cataract, left eye: Secondary | ICD-10-CM | POA: Diagnosis not present

## 2018-03-26 DIAGNOSIS — H1820 Unspecified corneal edema: Secondary | ICD-10-CM | POA: Diagnosis not present

## 2018-03-26 DIAGNOSIS — H25812 Combined forms of age-related cataract, left eye: Secondary | ICD-10-CM | POA: Diagnosis not present

## 2018-03-26 DIAGNOSIS — H353131 Nonexudative age-related macular degeneration, bilateral, early dry stage: Secondary | ICD-10-CM | POA: Diagnosis not present

## 2018-04-07 ENCOUNTER — Ambulatory Visit (INDEPENDENT_AMBULATORY_CARE_PROVIDER_SITE_OTHER): Payer: Medicare Other | Admitting: Family Medicine

## 2018-04-07 ENCOUNTER — Ambulatory Visit (INDEPENDENT_AMBULATORY_CARE_PROVIDER_SITE_OTHER): Payer: Medicare Other

## 2018-04-07 ENCOUNTER — Encounter: Payer: Self-pay | Admitting: Family Medicine

## 2018-04-07 VITALS — BP 132/77 | HR 55 | Temp 98.8°F | Ht 67.0 in | Wt 124.0 lb

## 2018-04-07 DIAGNOSIS — I25119 Atherosclerotic heart disease of native coronary artery with unspecified angina pectoris: Secondary | ICD-10-CM

## 2018-04-07 DIAGNOSIS — K219 Gastro-esophageal reflux disease without esophagitis: Secondary | ICD-10-CM | POA: Diagnosis not present

## 2018-04-07 DIAGNOSIS — K559 Vascular disorder of intestine, unspecified: Secondary | ICD-10-CM

## 2018-04-07 DIAGNOSIS — M545 Low back pain: Secondary | ICD-10-CM

## 2018-04-07 DIAGNOSIS — J8410 Pulmonary fibrosis, unspecified: Secondary | ICD-10-CM

## 2018-04-07 DIAGNOSIS — M8008XA Age-related osteoporosis with current pathological fracture, vertebra(e), initial encounter for fracture: Secondary | ICD-10-CM | POA: Diagnosis not present

## 2018-04-07 DIAGNOSIS — K9041 Non-celiac gluten sensitivity: Secondary | ICD-10-CM | POA: Diagnosis not present

## 2018-04-07 DIAGNOSIS — E559 Vitamin D deficiency, unspecified: Secondary | ICD-10-CM | POA: Diagnosis not present

## 2018-04-07 DIAGNOSIS — E7800 Pure hypercholesterolemia, unspecified: Secondary | ICD-10-CM | POA: Diagnosis not present

## 2018-04-07 DIAGNOSIS — R9389 Abnormal findings on diagnostic imaging of other specified body structures: Secondary | ICD-10-CM

## 2018-04-07 DIAGNOSIS — I700 Atherosclerosis of aorta: Secondary | ICD-10-CM | POA: Diagnosis not present

## 2018-04-07 MED ORDER — HYDROCODONE-ACETAMINOPHEN 5-325 MG PO TABS: 1.0000 | ORAL_TABLET | Freq: Four times a day (QID) | ORAL | 0 refills | Status: DC | PRN

## 2018-04-07 MED ORDER — ALPRAZOLAM 0.5 MG PO TABS: ORAL_TABLET | ORAL | 5 refills | Status: DC

## 2018-04-07 NOTE — Patient Instructions (Addendum)
Medicare Annual Wellness Visit  [place] and the medical providers at [facility] strive to bring you the best medical care.  In doing so we not only want to address your current medical conditions and concerns but also to detect new conditions early and prevent illness, disease and health-related problems.    Medicare offers a yearly Wellness Visit which allows our clinical staff to assess your need for preventative services including immunizations, lifestyle education, counseling to decrease risk of preventable diseases and screening for fall risk and other medical concerns.    This visit is provided free of charge (no copay) for all Medicare recipients. The clinical pharmacists at [facility] have begun to conduct these Wellness Visits which will also include a thorough review of all your medications.    As you primary medical provider recommend that you make an appointment for your Annual Wellness Visit if you have not done so already this year.  You may set up this appointment before you leave today or you may call back ([phone]) and schedule an appointment.  Please make sure when you call that you mention that you are scheduling your Annual Wellness Visit with the clinical pharmacist so that the appointment may be made for the proper length of time.     Continue current medications. Continue good therapeutic lifestyle changes which include good diet and exercise. Fall precautions discussed with patient. If an FOBT was given today- please return it to our front desk. If you are over 29 years old - you may need Prevnar 13 or the adult Pneumonia vaccine.  **Flu shots are available--- please call and schedule a FLU-CLINIC appointment**  After your visit with [place] today you will receive a survey in the mail or online from [employer] regarding your care with [place]. Please take a moment to fill this out. Your feedback is very  important to [place] as you can help [place] better understand your patient needs as well as improve your experience and satisfaction. WE CARE ABOUT YOU!!!    We will arrange a pulmonary (Dr Juanetta Gosling) and neurosurgery (Dr Channing Mutters) We will call you with results of labs and xray  CALL the cariology office back and set up appt  Phone # [phone]   Take pain medicine as needed and be careful to not put yourself at risk for falling Continue to try to eat healthy as directed by the gastroenterologist

## 2018-04-07 NOTE — Progress Notes (Signed)
Subjective:    Patient ID: Misty [name], female    DOB: 11/24/1940, 77 y.o.   MRN: [id number]  HPI Pt here for follow up and management of chronic medical problems which includes hyperlipidemia and gerd. She is taking medication regularly.  The patient is doing well but having severe problems with her back.  She has been seeing the gastroenterologist regularly and he feels like he has a handle on her GI issues with his current treatment regimen.  He is due to return in FOBT and get lab work today.  Her vital signs are stable and her weight is 124 pounds.  The previous CT scan which was done in April.  Had lungs that were compatible with interstitial lung disease.  She has diffuse bronchial wall thickening aortic atherosclerosis and the atherosclerosis was in the left main with two-vessel coronary artery disease.  Patient's back pain is a 10.  She says it is just like the back pain she had previously when she had a collapsed vertebrae.  We will try to get her in with a neurosurgeon as soon as possible.  Because of the CT scan that was abnormal and showed coronary artery disease we had arranged for her to see the cardiologist but apparently she was not there to receive the call when it came to her home and we will try to reschedule that visit with a cardiologist as soon as possible.  In the meantime we will also get her in to see the pulmonologist because of the finding of interstitial lung disease or pulmonary fibrosis on her CT scan.  We will going to make arrangements with both of the specialist hoping to get them in as soon as possible following a visit to the neurosurgeon because of her severe back pain.  He denies any chest pain or shortness of breath other than the shortness of breath from the pain that she is doing well.  Her stomach issues are better with the help of Dr. Melony [name] and thinking that she has gluten intolerance.  She does have a follow-up visit scheduled with him sometime soon.  She is  passing her water without problems.  She has put on some weight with the positive efforts that he has made with helping her stomach.    Patient Active Problem List   Diagnosis Date Noted  . Non-intractable vomiting with nausea 10/06/2017  . Malnutrition of moderate degree 01/29/2016  . Symptomatic anemia 01/27/2016  . Generalized weakness 01/27/2016  . Abnormal chest x-ray 01/27/2016  . Tobacco use disorder 01/27/2016  . Colitis   . Hematochezia 02/26/2015  . Ischemic colitis (Misty [name]) 02/26/2015  . Orthostatic hypotension 02/26/2015  . Non-traumatic compression fracture of T11 thoracic vertebra 07/08/2014  . Hyperlipidemia 08/19/2013  . Fibrocystic breast disease 08/19/2013  . History of migraine headaches 08/19/2013  . GERD (gastroesophageal reflux disease) 04/07/2013  . Osteoporosis, postmenopausal 04/07/2013   Outpatient Encounter Medications as of 04/07/2018  Medication Sig  . acetaminophen (TYLENOL) 500 MG tablet Take 500 mg by mouth every 6 (six) hours as needed for moderate pain or headache.   . ALPRAZolam (XANAX) 0.5 MG tablet TAKE  (1)  TABLET  THREE TIMES DAILY AS NEEDED.  . cholecalciferol (VITAMIN D) 1000 UNITS tablet Take 2,000-4,000 Units by mouth daily. Take 2000 units daily except take 4000 units on Saturday and Sunday.  . escitalopram (LEXAPRO) 10 MG tablet Take 2 tablets (20 mg total) by mouth at bedtime.  . FeFum-FePoly-FA-B Cmp-C-Biot (INTEGRA PLUS) CAPS Take  1 capsule by mouth daily. (Patient not taking: Reported on 03/16/2018)  . loperamide (IMODIUM) 2 MG capsule Take 1 capsule (2 mg total) by mouth daily as needed for diarrhea or loose stools. (Patient not taking: Reported on 03/16/2018)  . Multiple Vitamin (MULTIVITAMIN WITH MINERALS) TABS tablet Take 1 tablet by mouth daily. Centrum Silver  . pantoprazole (PROTONIX) 40 MG tablet Take 1 tablet (40 mg total) by mouth 2 (two) times daily before a meal.  . polyethylene glycol powder (GLYCOLAX/MIRALAX) powder Take 8.5 g  by mouth daily. (Patient taking differently: Take 8.5 g by mouth daily as needed (for constipation.). )  . ranitidine (ZANTAC) 300 MG tablet Take 300 mg by mouth daily as needed for heartburn.   . vitamin B-12 (CYANOCOBALAMIN) 1000 MCG tablet Take 1,000 mcg by mouth daily.   No facility-administered encounter medications on file as of 04/07/2018.       Review of Systems  Constitutional: Negative.   HENT: Negative.   Eyes: Negative.   Respiratory: Negative.   Cardiovascular: Negative.   Gastrointestinal: Negative.   Endocrine: Negative.   Genitourinary: Negative.   Musculoskeletal: Positive for back pain (low ).  Skin: Negative.   Allergic/Immunologic: Negative.   Neurological: Negative.   Hematological: Negative.   Psychiatric/Behavioral: Negative.        Objective:   Physical Exam  Constitutional: She is oriented to person, place, and time. She appears well-developed and well-nourished. No distress.  The patient is in severe pain today with her back and this started shortly after her recent cataract surgery.  She says it is just like the pain that she had when she had the vertebral fracture recently.  HENT:  [name] Normocephalic and atraumatic.  Right Ear: External ear normal.  Left Ear: External ear normal.  Nose: Nose normal.  Mouth/Throat: Oropharynx is clear and moist.  Eyes: Pupils are equal, round, and reactive to light. Conjunctivae and EOM are normal. Right eye exhibits no discharge. Left eye exhibits no discharge.  Doing well following cataract surgery  Neck: Normal range of motion. Neck supple. No thyromegaly present.  No adenopathy.  Cardiovascular: Normal rate, regular rhythm and normal heart sounds.  No murmur heard. Pulmonary/Chest: Effort normal and breath sounds normal. No respiratory distress. She has no wheezes. She has no rales.  Diminished breath sounds but no rales or wheezes today noted.  Abdominal: Soft. Bowel sounds are normal. She exhibits  distension. She exhibits no mass. There is no tenderness. There is no guarding.  Musculoskeletal: She exhibits no edema or tenderness.  Pain to palpation and low back with bilateral leg raising aggravating pain   Lymphadenopathy:    She has no cervical adenopathy.  Neurological: She is alert and oriented to person, place, and time. She has normal reflexes. No cranial nerve deficit.  Leg raising on either side results in increased back pain.  Skin: Skin is warm and dry. No rash noted.  Psychiatric: She has a normal mood and affect. Her behavior is normal. Judgment and thought content normal.  Patient is alert.  She is in severe pain and is very stressed today because of the pain level she is having.  Nursing note and vitals reviewed.  BP 132/77 (BP Location: Left Arm)   Pulse (!) 55   Temp 98.8 F (37.1 C) (Oral)   Ht '5\' 7"'$  (1.702 m)   Wt 124 lb (56.2 kg)   BMI 19.42 kg/m         Assessment & Plan:  1. Aortic  atherosclerosis ([place]) -This patient is statin intolerant.  She is also has gluten intolerance and is just beginning to do better with weight gain following treatment from the gastroenterologist.  She is currently not on any medication for cholesterol because of her GI issues and her intolerance of statins.  Based on her recent CT scan she does have two-vessel atherosclerotic coronary disease.  An appointment with a cardiologist is pending. - BMP8+EGFR - CBC with Differential/Platelet - Lipid panel  2. Pure hypercholesterolemia -Continue aggressive therapeutic lifestyle changes.  Consider reinitiating statin drug once the back pain issues are settled  - BMP8+EGFR - CBC with Differential/Platelet - Lipid panel  3. Vitamin D deficiency -Continue vitamin D replacement pending results of lab work - CBC with Differential/Platelet - VITAMIN D 25 Hydroxy (Vit-D Deficiency, Fractures)  4. Gastroesophageal reflux disease, esophagitis presence not specified -10 you with  pantoprazole and follow-up with gastroenterologist as planned - CBC with Differential/Platelet - Hepatic function panel  5. Pulmonary fibrosis ([place]) - CBC with Differential/Platelet - Ambulatory referral to Pulmonology  6. Abnormal CT of the chest - Ambulatory referral to Pulmonology  7. Coronary artery disease with angina pectoris, unspecified vessel or lesion type, unspecified whether native or transplanted heart [facility]) -Appointment to be arranged with cardiology.  Apparently they have already tried to contact her and she was not available for the contact.  8. Ischemic colitis ([place]) -Follow-up with gastroenterology as planned  9. Gluten intolerance -Follow-up with gastroenterology as planned  10. Low back pain, unspecified back pain laterality, unspecified chronicity, with sciatica presence unspecified - Ambulatory referral to Neurosurgery - DG Lumbar Spine 2-3 Views; Future  11. Fracture of vertebra due to osteoporosis, initial encounter [facility]) -This is a probable fracture because it is somewhat similar to the previous one.  We will get the x-rays and do a referral to the neurosurgeon for further follow-up.  Meds ordered this encounter  Medications  . ALPRAZolam (XANAX) 0.5 MG tablet    Sig: TAKE  (1)  TABLET  THREE TIMES DAILY AS NEEDED.    Dispense:  90 tablet    Refill:  5  . HYDROcodone-acetaminophen (NORCO/VICODIN) 5-325 MG tablet    Sig: Take 1 tablet by mouth every 6 (six) hours as needed for moderate pain.    Dispense:  20 tablet    Refill:  0   Patient Instructions                       Medicare Annual Wellness Visit  [facility] and the medical providers at [facility] strive to bring you the best medical care.  In doing so we not only want to address your current medical conditions and concerns but also to detect new conditions early and prevent illness, disease and health-related problems.    Medicare offers a yearly Wellness Visit which  allows our clinical staff to assess your need for preventative services including immunizations, lifestyle education, counseling to decrease risk of preventable diseases and screening for fall risk and other medical concerns.    This visit is provided free of charge (no copay) for all Medicare recipients. The clinical pharmacists at [facility] have begun to conduct these Wellness Visits which will also include a thorough review of all your medications.    As you primary medical provider recommend that you make an appointment for your Annual Wellness Visit if you have not done so already this year.  You may set up this appointment  before you leave today or you may call back ([phone]) and schedule an appointment.  Please make sure when you call that you mention that you are scheduling your Annual Wellness Visit with the clinical pharmacist so that the appointment may be made for the proper length of time.     Continue current medications. Continue good therapeutic lifestyle changes which include good diet and exercise. Fall precautions discussed with patient. If an FOBT was given today- please return it to our front desk. If you are over 73 years old - you may need Prevnar 73 or the adult Pneumonia vaccine.  **Flu shots are available--- please call and schedule a FLU-CLINIC appointment**  After your visit with [place] today you will receive a survey in the mail or online from [employer] regarding your care with [place]. Please take a moment to fill this out. Your feedback is very important to [place] as you can help [place] better understand your patient needs as well as improve your experience and satisfaction. WE CARE ABOUT YOU!!!    We will arrange a pulmonary (Dr Luan Pulling) and neurosurgery (Dr Carloyn Manner) We will call you with results of labs and xray  CALL the cariology office back and set up appt  Phone # [phone]   Take pain medicine as needed and be careful to not put yourself at risk  for falling Continue to try to eat healthy as directed by the gastroenterologist   Arrie Senate MD

## 2018-04-08 LAB — CBC WITH DIFFERENTIAL/PLATELET
BASOS ABS: 0 10*3/uL (ref 0.0–0.2)
BASOS: 0 %
EOS (ABSOLUTE): 0.2 10*3/uL (ref 0.0–0.4)
Eos: 3 %
HEMOGLOBIN: 10.4 g/dL — AB (ref 11.1–15.9)
Hematocrit: 33.8 % — ABNORMAL LOW (ref 34.0–46.6)
IMMATURE GRANS (ABS): 0 10*3/uL (ref 0.0–0.1)
Immature Granulocytes: 0 %
LYMPHS: 13 %
Lymphocytes Absolute: 1 10*3/uL (ref 0.7–3.1)
MCH: 26.1 pg — AB (ref 26.6–33.0)
MCHC: 30.8 g/dL — ABNORMAL LOW (ref 31.5–35.7)
MCV: 85 fL (ref 79–97)
MONOCYTES: 8 %
Monocytes Absolute: 0.6 10*3/uL (ref 0.1–0.9)
NEUTROS ABS: 6.1 10*3/uL (ref 1.4–7.0)
Neutrophils: 76 %
Platelets: 282 10*3/uL (ref 150–450)
RBC: 3.99 x10E6/uL (ref 3.77–5.28)
RDW: 16.2 % — ABNORMAL HIGH (ref 12.3–15.4)
WBC: 8 10*3/uL (ref 3.4–10.8)

## 2018-04-08 LAB — HEPATIC FUNCTION PANEL
ALK PHOS: 119 IU/L — AB (ref 39–117)
ALT: 8 IU/L (ref 0–32)
AST: 13 IU/L (ref 0–40)
Albumin: 4 g/dL (ref 3.5–4.8)
Bilirubin Total: 0.3 mg/dL (ref 0.0–1.2)
Bilirubin, Direct: 0.1 mg/dL (ref 0.00–0.40)
TOTAL PROTEIN: 6.4 g/dL (ref 6.0–8.5)

## 2018-04-08 LAB — BMP8+EGFR
BUN/Creatinine Ratio: 8 — ABNORMAL LOW (ref 12–28)
BUN: 6 mg/dL — AB (ref 8–27)
CALCIUM: 9 mg/dL (ref 8.7–10.3)
CO2: 28 mmol/L (ref 20–29)
CREATININE: 0.79 mg/dL (ref 0.57–1.00)
Chloride: 99 mmol/L (ref 96–106)
GFR, EST AFRICAN AMERICAN: 84 mL/min/{1.73_m2} (ref >59)
GFR, EST NON AFRICAN AMERICAN: 73 mL/min/{1.73_m2} (ref >59)
Glucose: 107 mg/dL — ABNORMAL HIGH (ref 65–99)
Potassium: 3.4 mmol/L — ABNORMAL LOW (ref 3.5–5.2)
Sodium: 143 mmol/L (ref 134–144)

## 2018-04-08 LAB — LIPID PANEL
CHOLESTEROL TOTAL: 138 mg/dL (ref 100–199)
Chol/HDL Ratio: 2.4 ratio (ref 0.0–4.4)
HDL: 58 mg/dL (ref >39)
LDL CALC: 61 mg/dL (ref 0–99)
Triglycerides: 97 mg/dL (ref 0–149)
VLDL CHOLESTEROL CAL: 19 mg/dL (ref 5–40)

## 2018-04-08 LAB — VITAMIN D 25 HYDROXY (VIT D DEFICIENCY, FRACTURES): Vit D, 25-Hydroxy: 39.2 ng/mL (ref 30.0–100.0)

## 2018-04-16 DIAGNOSIS — Z9889 Other specified postprocedural states: Secondary | ICD-10-CM | POA: Diagnosis not present

## 2018-04-16 DIAGNOSIS — M8580 Other specified disorders of bone density and structure, unspecified site: Secondary | ICD-10-CM | POA: Insufficient documentation

## 2018-04-16 DIAGNOSIS — M545 Low back pain: Secondary | ICD-10-CM | POA: Diagnosis not present

## 2018-04-16 DIAGNOSIS — M4854XA Collapsed vertebra, not elsewhere classified, thoracic region, initial encounter for fracture: Secondary | ICD-10-CM | POA: Diagnosis not present

## 2018-04-16 DIAGNOSIS — I700 Atherosclerosis of aorta: Secondary | ICD-10-CM | POA: Diagnosis not present

## 2018-04-16 DIAGNOSIS — S22080A Wedge compression fracture of T11-T12 vertebra, initial encounter for closed fracture: Secondary | ICD-10-CM | POA: Diagnosis not present

## 2018-04-16 DIAGNOSIS — M546 Pain in thoracic spine: Secondary | ICD-10-CM | POA: Diagnosis not present

## 2018-04-24 DIAGNOSIS — S22000G Wedge compression fracture of unspecified thoracic vertebra, subsequent encounter for fracture with delayed healing: Secondary | ICD-10-CM | POA: Diagnosis not present

## 2018-04-24 DIAGNOSIS — M8580 Other specified disorders of bone density and structure, unspecified site: Secondary | ICD-10-CM | POA: Diagnosis not present

## 2018-04-28 DIAGNOSIS — Z8249 Family history of ischemic heart disease and other diseases of the circulatory system: Secondary | ICD-10-CM | POA: Diagnosis not present

## 2018-04-28 DIAGNOSIS — Z79899 Other long term (current) drug therapy: Secondary | ICD-10-CM | POA: Diagnosis not present

## 2018-04-28 DIAGNOSIS — Z886 Allergy status to analgesic agent status: Secondary | ICD-10-CM | POA: Diagnosis not present

## 2018-04-28 DIAGNOSIS — Z90710 Acquired absence of both cervix and uterus: Secondary | ICD-10-CM | POA: Diagnosis not present

## 2018-04-28 DIAGNOSIS — D649 Anemia, unspecified: Secondary | ICD-10-CM | POA: Diagnosis not present

## 2018-04-28 DIAGNOSIS — Z888 Allergy status to other drugs, medicaments and biological substances status: Secondary | ICD-10-CM | POA: Diagnosis not present

## 2018-04-28 DIAGNOSIS — S22080A Wedge compression fracture of T11-T12 vertebra, initial encounter for closed fracture: Secondary | ICD-10-CM | POA: Diagnosis not present

## 2018-04-28 DIAGNOSIS — R918 Other nonspecific abnormal finding of lung field: Secondary | ICD-10-CM | POA: Diagnosis not present

## 2018-04-28 DIAGNOSIS — F17210 Nicotine dependence, cigarettes, uncomplicated: Secondary | ICD-10-CM | POA: Diagnosis not present

## 2018-04-28 DIAGNOSIS — M8580 Other specified disorders of bone density and structure, unspecified site: Secondary | ICD-10-CM | POA: Diagnosis not present

## 2018-04-28 DIAGNOSIS — K219 Gastro-esophageal reflux disease without esophagitis: Secondary | ICD-10-CM | POA: Diagnosis not present

## 2018-04-28 DIAGNOSIS — F419 Anxiety disorder, unspecified: Secondary | ICD-10-CM | POA: Diagnosis not present

## 2018-04-28 DIAGNOSIS — R0902 Hypoxemia: Secondary | ICD-10-CM | POA: Diagnosis not present

## 2018-04-29 DIAGNOSIS — M4854XA Collapsed vertebra, not elsewhere classified, thoracic region, initial encounter for fracture: Secondary | ICD-10-CM | POA: Diagnosis not present

## 2018-04-29 DIAGNOSIS — K219 Gastro-esophageal reflux disease without esophagitis: Secondary | ICD-10-CM | POA: Diagnosis not present

## 2018-04-29 DIAGNOSIS — F419 Anxiety disorder, unspecified: Secondary | ICD-10-CM | POA: Diagnosis not present

## 2018-04-29 DIAGNOSIS — Z9889 Other specified postprocedural states: Secondary | ICD-10-CM | POA: Diagnosis not present

## 2018-04-29 DIAGNOSIS — Z981 Arthrodesis status: Secondary | ICD-10-CM | POA: Diagnosis not present

## 2018-04-29 DIAGNOSIS — F17210 Nicotine dependence, cigarettes, uncomplicated: Secondary | ICD-10-CM | POA: Diagnosis not present

## 2018-04-29 DIAGNOSIS — Z79899 Other long term (current) drug therapy: Secondary | ICD-10-CM | POA: Diagnosis not present

## 2018-04-29 DIAGNOSIS — S22080A Wedge compression fracture of T11-T12 vertebra, initial encounter for closed fracture: Secondary | ICD-10-CM | POA: Diagnosis not present

## 2018-04-29 DIAGNOSIS — M8580 Other specified disorders of bone density and structure, unspecified site: Secondary | ICD-10-CM | POA: Diagnosis not present

## 2018-04-30 DIAGNOSIS — F17210 Nicotine dependence, cigarettes, uncomplicated: Secondary | ICD-10-CM | POA: Diagnosis not present

## 2018-04-30 DIAGNOSIS — R0902 Hypoxemia: Secondary | ICD-10-CM | POA: Diagnosis not present

## 2018-04-30 DIAGNOSIS — M8580 Other specified disorders of bone density and structure, unspecified site: Secondary | ICD-10-CM | POA: Diagnosis not present

## 2018-04-30 DIAGNOSIS — S22080A Wedge compression fracture of T11-T12 vertebra, initial encounter for closed fracture: Secondary | ICD-10-CM | POA: Diagnosis not present

## 2018-04-30 DIAGNOSIS — Z79899 Other long term (current) drug therapy: Secondary | ICD-10-CM | POA: Diagnosis not present

## 2018-04-30 DIAGNOSIS — F419 Anxiety disorder, unspecified: Secondary | ICD-10-CM | POA: Diagnosis not present

## 2018-04-30 DIAGNOSIS — K219 Gastro-esophageal reflux disease without esophagitis: Secondary | ICD-10-CM | POA: Diagnosis not present

## 2018-05-01 ENCOUNTER — Telehealth: Payer: Self-pay | Admitting: *Deleted

## 2018-05-01 DIAGNOSIS — S22080D Wedge compression fracture of T11-T12 vertebra, subsequent encounter for fracture with routine healing: Secondary | ICD-10-CM | POA: Diagnosis not present

## 2018-05-01 DIAGNOSIS — J449 Chronic obstructive pulmonary disease, unspecified: Secondary | ICD-10-CM | POA: Diagnosis not present

## 2018-05-01 DIAGNOSIS — D649 Anemia, unspecified: Secondary | ICD-10-CM | POA: Diagnosis not present

## 2018-05-01 DIAGNOSIS — Z8781 Personal history of (healed) traumatic fracture: Secondary | ICD-10-CM | POA: Diagnosis not present

## 2018-05-01 DIAGNOSIS — M8580 Other specified disorders of bone density and structure, unspecified site: Secondary | ICD-10-CM | POA: Diagnosis not present

## 2018-05-01 DIAGNOSIS — K219 Gastro-esophageal reflux disease without esophagitis: Secondary | ICD-10-CM | POA: Diagnosis not present

## 2018-05-01 DIAGNOSIS — F17210 Nicotine dependence, cigarettes, uncomplicated: Secondary | ICD-10-CM | POA: Diagnosis not present

## 2018-05-01 DIAGNOSIS — Z9981 Dependence on supplemental oxygen: Secondary | ICD-10-CM | POA: Diagnosis not present

## 2018-05-01 DIAGNOSIS — F419 Anxiety disorder, unspecified: Secondary | ICD-10-CM | POA: Diagnosis not present

## 2018-05-01 NOTE — Telephone Encounter (Signed)
Spoke with [facility] asking for order for O2 Due to hypoxia per [facility] order sent in to [facility] per Dr Christell Constant

## 2018-05-01 NOTE — Telephone Encounter (Signed)
Misty [name] [facility] say pt today Pt was laying on couch when she arrived Pt was sent home on 3L of oxygen but tank is empty Tank is to be delivered in the morning & nurse is to return as well Pt's O2 was 79/78 then 88/90 heart rate 88 no c/o CP BP 142/70 has COPD

## 2018-05-05 DIAGNOSIS — F419 Anxiety disorder, unspecified: Secondary | ICD-10-CM | POA: Diagnosis not present

## 2018-05-05 DIAGNOSIS — S22080D Wedge compression fracture of T11-T12 vertebra, subsequent encounter for fracture with routine healing: Secondary | ICD-10-CM | POA: Diagnosis not present

## 2018-05-05 DIAGNOSIS — D649 Anemia, unspecified: Secondary | ICD-10-CM | POA: Diagnosis not present

## 2018-05-05 DIAGNOSIS — M8580 Other specified disorders of bone density and structure, unspecified site: Secondary | ICD-10-CM | POA: Diagnosis not present

## 2018-05-05 DIAGNOSIS — K219 Gastro-esophageal reflux disease without esophagitis: Secondary | ICD-10-CM | POA: Diagnosis not present

## 2018-05-05 DIAGNOSIS — J449 Chronic obstructive pulmonary disease, unspecified: Secondary | ICD-10-CM | POA: Diagnosis not present

## 2018-05-07 DIAGNOSIS — K219 Gastro-esophageal reflux disease without esophagitis: Secondary | ICD-10-CM | POA: Diagnosis not present

## 2018-05-07 DIAGNOSIS — D649 Anemia, unspecified: Secondary | ICD-10-CM | POA: Diagnosis not present

## 2018-05-07 DIAGNOSIS — M8580 Other specified disorders of bone density and structure, unspecified site: Secondary | ICD-10-CM | POA: Diagnosis not present

## 2018-05-07 DIAGNOSIS — F419 Anxiety disorder, unspecified: Secondary | ICD-10-CM | POA: Diagnosis not present

## 2018-05-07 DIAGNOSIS — J449 Chronic obstructive pulmonary disease, unspecified: Secondary | ICD-10-CM | POA: Diagnosis not present

## 2018-05-07 DIAGNOSIS — S22080D Wedge compression fracture of T11-T12 vertebra, subsequent encounter for fracture with routine healing: Secondary | ICD-10-CM | POA: Diagnosis not present

## 2018-05-13 ENCOUNTER — Ambulatory Visit (INDEPENDENT_AMBULATORY_CARE_PROVIDER_SITE_OTHER): Payer: Medicare Other

## 2018-05-13 DIAGNOSIS — S22080D Wedge compression fracture of T11-T12 vertebra, subsequent encounter for fracture with routine healing: Secondary | ICD-10-CM

## 2018-05-13 DIAGNOSIS — F419 Anxiety disorder, unspecified: Secondary | ICD-10-CM | POA: Diagnosis not present

## 2018-05-13 DIAGNOSIS — D649 Anemia, unspecified: Secondary | ICD-10-CM | POA: Diagnosis not present

## 2018-05-13 DIAGNOSIS — Z8781 Personal history of (healed) traumatic fracture: Secondary | ICD-10-CM | POA: Diagnosis not present

## 2018-05-13 DIAGNOSIS — J449 Chronic obstructive pulmonary disease, unspecified: Secondary | ICD-10-CM | POA: Diagnosis not present

## 2018-05-13 DIAGNOSIS — K219 Gastro-esophageal reflux disease without esophagitis: Secondary | ICD-10-CM | POA: Diagnosis not present

## 2018-05-13 DIAGNOSIS — F17210 Nicotine dependence, cigarettes, uncomplicated: Secondary | ICD-10-CM | POA: Diagnosis not present

## 2018-05-13 DIAGNOSIS — M8580 Other specified disorders of bone density and structure, unspecified site: Secondary | ICD-10-CM | POA: Diagnosis not present

## 2018-05-13 DIAGNOSIS — Z9981 Dependence on supplemental oxygen: Secondary | ICD-10-CM | POA: Diagnosis not present

## 2018-05-14 ENCOUNTER — Telehealth: Payer: Self-pay | Admitting: *Deleted

## 2018-05-14 NOTE — Telephone Encounter (Signed)
appt made for tomorrow with DR Chyrel Masson and pt aware

## 2018-05-14 NOTE — Telephone Encounter (Signed)
VM received from Misty [name] w/ [facility] Had back surgery by Dr. Carloyn Manner about 04/29/18 O2 sats on room are 95-98%, yesterday she had wheezing in all lobes, she started Mucinex has clear phlegm, is still smoking.  Please advise if needs to be seen

## 2018-05-15 ENCOUNTER — Ambulatory Visit (INDEPENDENT_AMBULATORY_CARE_PROVIDER_SITE_OTHER): Payer: Medicare Other

## 2018-05-15 ENCOUNTER — Encounter: Payer: Self-pay | Admitting: Family Medicine

## 2018-05-15 ENCOUNTER — Ambulatory Visit (INDEPENDENT_AMBULATORY_CARE_PROVIDER_SITE_OTHER): Payer: Medicare Other | Admitting: Family Medicine

## 2018-05-15 VITALS — BP 135/77 | HR 88 | Temp 97.8°F | Ht 67.0 in | Wt 124.6 lb

## 2018-05-15 DIAGNOSIS — J8410 Pulmonary fibrosis, unspecified: Secondary | ICD-10-CM

## 2018-05-15 DIAGNOSIS — I25119 Atherosclerotic heart disease of native coronary artery with unspecified angina pectoris: Secondary | ICD-10-CM | POA: Diagnosis not present

## 2018-05-15 DIAGNOSIS — R05 Cough: Secondary | ICD-10-CM | POA: Diagnosis not present

## 2018-05-15 DIAGNOSIS — R059 Cough, unspecified: Secondary | ICD-10-CM

## 2018-05-15 MED ORDER — DOXYCYCLINE HYCLATE 100 MG PO TABS: 100.0000 mg | ORAL_TABLET | Freq: Two times a day (BID) | ORAL | 0 refills | Status: DC

## 2018-05-15 NOTE — Progress Notes (Signed)
   HPI  Patient presents today here for cough and shortness of breath.  Patient has history of pulmonary fibrosis/emphysema. She had kyphoplasty on June 13 for a new vertebral fracture.  She reports about 2 days of worsening shortness of breath, wheezing, and cough.  She states that home health told her that she was wheezing and recommended she come in for a chest x-ray.  She denies fever, chills, sweats.  She is tolerating food and fluids like usual.  She has a questionable history of prednisone allergy. She was referred to pulmonology but had her fracture in the interim and has not made it to have an appointment with them.  PMH: Smoking status noted ROS: Per HPI  Objective: BP 135/77   Pulse 88   Temp 97.8 F (36.6 C) (Oral)   Ht 5' 7"  (1.702 m)   Wt 124 lb 9.6 oz (56.5 kg)   SpO2 92%   BMI 19.52 kg/m  Gen: NAD, alert, cooperative with exam HEENT: NCAT CV: RRR, good S1/S2, no murmur Resp: CTABL, diffuse wheezing, good air movement, non labored Ext: No edema, warm Neuro: Alert and oriented, No gross deficits  Assessment and plan:  # cough, pulmonary fibrosis Pt with Hx of fibrosis vs emphysema, now with exacerbation.  Given vertebral fx and likely osteoporosis, and possible prednisone allergy I have avoided steroids for now Doxy Recommended pulm follow up asap     Orders Placed This Encounter  Procedures  . DG Chest 2 View    Standing Status:   Future    Number of Occurrences:   1    Standing Expiration Date:   07/16/2019    Order Specific Question:   Reason for Exam (SYMPTOM  OR DIAGNOSIS REQUIRED)    Answer:   cough, pulm fibrosis/emphysema    Order Specific Question:   Preferred imaging location?    Answer:   Internal    Order Specific Question:   Radiology Contrast Protocol - do NOT remove file path    Answer:   [email or web address]    Meds ordered this encounter  Medications  . doxycycline (VIBRA-TABS) 100 MG  tablet    Sig: Take 1 tablet (100 mg total) by mouth 2 (two) times daily. 1 po bid    Dispense:  20 tablet    Refill:  0    Laroy Apple, MD [facility] 05/15/2018, 11:44 AM

## 2018-05-15 NOTE — Patient Instructions (Signed)
Great to see you!  Be sure to call Dr. Juanetta Gosling office for an appointment  We will call with xray results  Start doxycyline today.

## 2018-05-18 ENCOUNTER — Other Ambulatory Visit (INDEPENDENT_AMBULATORY_CARE_PROVIDER_SITE_OTHER): Payer: Self-pay | Admitting: Internal Medicine

## 2018-05-19 ENCOUNTER — Ambulatory Visit (INDEPENDENT_AMBULATORY_CARE_PROVIDER_SITE_OTHER): Payer: Medicare Other | Admitting: Internal Medicine

## 2018-05-22 DIAGNOSIS — D649 Anemia, unspecified: Secondary | ICD-10-CM | POA: Diagnosis not present

## 2018-05-22 DIAGNOSIS — S22080D Wedge compression fracture of T11-T12 vertebra, subsequent encounter for fracture with routine healing: Secondary | ICD-10-CM | POA: Diagnosis not present

## 2018-05-22 DIAGNOSIS — J449 Chronic obstructive pulmonary disease, unspecified: Secondary | ICD-10-CM | POA: Diagnosis not present

## 2018-05-22 DIAGNOSIS — K219 Gastro-esophageal reflux disease without esophagitis: Secondary | ICD-10-CM | POA: Diagnosis not present

## 2018-05-22 DIAGNOSIS — F419 Anxiety disorder, unspecified: Secondary | ICD-10-CM | POA: Diagnosis not present

## 2018-05-22 DIAGNOSIS — M8580 Other specified disorders of bone density and structure, unspecified site: Secondary | ICD-10-CM | POA: Diagnosis not present

## 2018-05-27 DIAGNOSIS — M8580 Other specified disorders of bone density and structure, unspecified site: Secondary | ICD-10-CM | POA: Diagnosis not present

## 2018-05-27 DIAGNOSIS — D649 Anemia, unspecified: Secondary | ICD-10-CM | POA: Diagnosis not present

## 2018-05-27 DIAGNOSIS — S22080D Wedge compression fracture of T11-T12 vertebra, subsequent encounter for fracture with routine healing: Secondary | ICD-10-CM | POA: Diagnosis not present

## 2018-05-27 DIAGNOSIS — F419 Anxiety disorder, unspecified: Secondary | ICD-10-CM | POA: Diagnosis not present

## 2018-05-27 DIAGNOSIS — J449 Chronic obstructive pulmonary disease, unspecified: Secondary | ICD-10-CM | POA: Diagnosis not present

## 2018-05-27 DIAGNOSIS — K219 Gastro-esophageal reflux disease without esophagitis: Secondary | ICD-10-CM | POA: Diagnosis not present

## 2018-06-03 ENCOUNTER — Telehealth: Payer: Self-pay | Admitting: *Deleted

## 2018-06-03 DIAGNOSIS — F419 Anxiety disorder, unspecified: Secondary | ICD-10-CM | POA: Diagnosis not present

## 2018-06-03 DIAGNOSIS — M8580 Other specified disorders of bone density and structure, unspecified site: Secondary | ICD-10-CM | POA: Diagnosis not present

## 2018-06-03 DIAGNOSIS — J449 Chronic obstructive pulmonary disease, unspecified: Secondary | ICD-10-CM | POA: Diagnosis not present

## 2018-06-03 DIAGNOSIS — K219 Gastro-esophageal reflux disease without esophagitis: Secondary | ICD-10-CM | POA: Diagnosis not present

## 2018-06-03 DIAGNOSIS — D649 Anemia, unspecified: Secondary | ICD-10-CM | POA: Diagnosis not present

## 2018-06-03 DIAGNOSIS — S22080D Wedge compression fracture of T11-T12 vertebra, subsequent encounter for fracture with routine healing: Secondary | ICD-10-CM | POA: Diagnosis not present

## 2018-06-03 NOTE — Telephone Encounter (Signed)
VM fr Otila Kluver nurse w/ [facility] Pt c/o severe weakness even with little ambulation She is a known anemic was seen last on 05/15/18 Can HH bring in a CBC or should she be seen Please advise

## 2018-06-03 NOTE — Telephone Encounter (Signed)
HH nurse is wanting to see if patient can get an Rx for ProAir inhaler for cough / wheezing as needed Please advise as well

## 2018-06-04 MED ORDER — ALBUTEROL SULFATE HFA 108 (90 BASE) MCG/ACT IN AERS: 2.0000 | INHALATION_SPRAY | Freq: Four times a day (QID) | RESPIRATORY_TRACT | 0 refills | Status: DC | PRN

## 2018-06-04 NOTE — Telephone Encounter (Signed)
Ok with CBC, ok with albuterol which I will send.   Note I was not in the office when this was sent. Sorry for the delay.   Murtis Sink, MD [facility] 06/04/2018, 7:33 AM

## 2018-06-04 NOTE — Telephone Encounter (Signed)
[place] nurse aware

## 2018-06-05 DIAGNOSIS — Z09 Encounter for follow-up examination after completed treatment for conditions other than malignant neoplasm: Secondary | ICD-10-CM | POA: Diagnosis not present

## 2018-06-05 DIAGNOSIS — S22080A Wedge compression fracture of T11-T12 vertebra, initial encounter for closed fracture: Secondary | ICD-10-CM | POA: Diagnosis not present

## 2018-06-09 DIAGNOSIS — K219 Gastro-esophageal reflux disease without esophagitis: Secondary | ICD-10-CM | POA: Diagnosis not present

## 2018-06-09 DIAGNOSIS — S22080D Wedge compression fracture of T11-T12 vertebra, subsequent encounter for fracture with routine healing: Secondary | ICD-10-CM | POA: Diagnosis not present

## 2018-06-09 DIAGNOSIS — D649 Anemia, unspecified: Secondary | ICD-10-CM | POA: Diagnosis not present

## 2018-06-09 DIAGNOSIS — M8580 Other specified disorders of bone density and structure, unspecified site: Secondary | ICD-10-CM | POA: Diagnosis not present

## 2018-06-09 DIAGNOSIS — J449 Chronic obstructive pulmonary disease, unspecified: Secondary | ICD-10-CM | POA: Diagnosis not present

## 2018-06-09 DIAGNOSIS — F419 Anxiety disorder, unspecified: Secondary | ICD-10-CM | POA: Diagnosis not present

## 2018-06-16 DIAGNOSIS — F419 Anxiety disorder, unspecified: Secondary | ICD-10-CM | POA: Diagnosis not present

## 2018-06-16 DIAGNOSIS — J449 Chronic obstructive pulmonary disease, unspecified: Secondary | ICD-10-CM | POA: Diagnosis not present

## 2018-06-16 DIAGNOSIS — D649 Anemia, unspecified: Secondary | ICD-10-CM | POA: Diagnosis not present

## 2018-06-16 DIAGNOSIS — M8580 Other specified disorders of bone density and structure, unspecified site: Secondary | ICD-10-CM | POA: Diagnosis not present

## 2018-06-16 DIAGNOSIS — S22080D Wedge compression fracture of T11-T12 vertebra, subsequent encounter for fracture with routine healing: Secondary | ICD-10-CM | POA: Diagnosis not present

## 2018-06-16 DIAGNOSIS — K219 Gastro-esophageal reflux disease without esophagitis: Secondary | ICD-10-CM | POA: Diagnosis not present

## 2018-06-17 ENCOUNTER — Other Ambulatory Visit (INDEPENDENT_AMBULATORY_CARE_PROVIDER_SITE_OTHER): Payer: Self-pay | Admitting: *Deleted

## 2018-06-17 DIAGNOSIS — Z79899 Other long term (current) drug therapy: Secondary | ICD-10-CM

## 2018-06-17 DIAGNOSIS — D649 Anemia, unspecified: Secondary | ICD-10-CM

## 2018-06-17 DIAGNOSIS — R531 Weakness: Secondary | ICD-10-CM

## 2018-06-22 ENCOUNTER — Other Ambulatory Visit: Payer: Medicare Other

## 2018-06-22 DIAGNOSIS — Z79899 Other long term (current) drug therapy: Secondary | ICD-10-CM | POA: Diagnosis not present

## 2018-06-22 DIAGNOSIS — D649 Anemia, unspecified: Secondary | ICD-10-CM | POA: Diagnosis not present

## 2018-06-22 DIAGNOSIS — R531 Weakness: Secondary | ICD-10-CM | POA: Diagnosis not present

## 2018-06-23 LAB — CBC
Hematocrit: 32 % — ABNORMAL LOW (ref 34.0–46.6)
Hemoglobin: 9.7 g/dL — ABNORMAL LOW (ref 11.1–15.9)
MCH: 26.7 pg (ref 26.6–33.0)
MCHC: 30.3 g/dL — ABNORMAL LOW (ref 31.5–35.7)
MCV: 88 fL (ref 79–97)
PLATELETS: 256 10*3/uL (ref 150–450)
RBC: 3.63 x10E6/uL — AB (ref 3.77–5.28)
RDW: 21.3 % — ABNORMAL HIGH (ref 12.3–15.4)
WBC: 7.3 10*3/uL (ref 3.4–10.8)

## 2018-06-23 LAB — IRON AND TIBC
IRON SATURATION: 7 % — AB (ref 15–55)
Iron: 21 ug/dL — ABNORMAL LOW (ref 27–139)
TIBC: 281 ug/dL (ref 250–450)
UIBC: 260 ug/dL (ref 118–369)

## 2018-06-23 LAB — FERRITIN: Ferritin: 14 ng/mL — ABNORMAL LOW (ref 15–150)

## 2018-06-23 LAB — VITAMIN B12: Vitamin B-12: 486 pg/mL (ref 232–1245)

## 2018-06-27 ENCOUNTER — Other Ambulatory Visit (INDEPENDENT_AMBULATORY_CARE_PROVIDER_SITE_OTHER): Payer: Self-pay | Admitting: Internal Medicine

## 2018-06-27 MED ORDER — INTEGRA PLUS PO CAPS: 1.0000 | ORAL_CAPSULE | Freq: Every day | ORAL | 11 refills | Status: DC

## 2018-06-29 ENCOUNTER — Other Ambulatory Visit (INDEPENDENT_AMBULATORY_CARE_PROVIDER_SITE_OTHER): Payer: Self-pay | Admitting: *Deleted

## 2018-06-29 DIAGNOSIS — J449 Chronic obstructive pulmonary disease, unspecified: Secondary | ICD-10-CM | POA: Diagnosis not present

## 2018-06-29 DIAGNOSIS — D649 Anemia, unspecified: Secondary | ICD-10-CM

## 2018-06-29 DIAGNOSIS — S22080D Wedge compression fracture of T11-T12 vertebra, subsequent encounter for fracture with routine healing: Secondary | ICD-10-CM | POA: Diagnosis not present

## 2018-06-29 DIAGNOSIS — K219 Gastro-esophageal reflux disease without esophagitis: Secondary | ICD-10-CM | POA: Diagnosis not present

## 2018-06-29 DIAGNOSIS — M8580 Other specified disorders of bone density and structure, unspecified site: Secondary | ICD-10-CM | POA: Diagnosis not present

## 2018-06-29 DIAGNOSIS — F419 Anxiety disorder, unspecified: Secondary | ICD-10-CM | POA: Diagnosis not present

## 2018-07-03 DIAGNOSIS — Z09 Encounter for follow-up examination after completed treatment for conditions other than malignant neoplasm: Secondary | ICD-10-CM | POA: Diagnosis not present

## 2018-07-03 DIAGNOSIS — S22080A Wedge compression fracture of T11-T12 vertebra, initial encounter for closed fracture: Secondary | ICD-10-CM | POA: Diagnosis not present

## 2018-07-10 ENCOUNTER — Other Ambulatory Visit (INDEPENDENT_AMBULATORY_CARE_PROVIDER_SITE_OTHER): Payer: Self-pay | Admitting: *Deleted

## 2018-07-10 ENCOUNTER — Encounter (INDEPENDENT_AMBULATORY_CARE_PROVIDER_SITE_OTHER): Payer: Self-pay | Admitting: *Deleted

## 2018-07-10 DIAGNOSIS — D649 Anemia, unspecified: Secondary | ICD-10-CM

## 2018-07-28 ENCOUNTER — Other Ambulatory Visit: Payer: Medicare Other

## 2018-07-28 DIAGNOSIS — D649 Anemia, unspecified: Secondary | ICD-10-CM | POA: Diagnosis not present

## 2018-07-29 LAB — CBC
Hematocrit: 37 % (ref 34.0–46.6)
Hemoglobin: 11 g/dL — ABNORMAL LOW (ref 11.1–15.9)
MCH: 27.3 pg (ref 26.6–33.0)
MCHC: 29.7 g/dL — ABNORMAL LOW (ref 31.5–35.7)
MCV: 92 fL (ref 79–97)
PLATELETS: 200 10*3/uL (ref 150–450)
RBC: 4.03 x10E6/uL (ref 3.77–5.28)
RDW: 20 % — AB (ref 12.3–15.4)
WBC: 6.7 10*3/uL (ref 3.4–10.8)

## 2018-07-30 ENCOUNTER — Other Ambulatory Visit (INDEPENDENT_AMBULATORY_CARE_PROVIDER_SITE_OTHER): Payer: Self-pay | Admitting: *Deleted

## 2018-07-30 DIAGNOSIS — D649 Anemia, unspecified: Secondary | ICD-10-CM

## 2018-08-10 ENCOUNTER — Ambulatory Visit: Payer: Medicare Other | Admitting: Family Medicine

## 2018-08-20 DIAGNOSIS — M79676 Pain in unspecified toe(s): Secondary | ICD-10-CM | POA: Diagnosis not present

## 2018-08-20 DIAGNOSIS — B351 Tinea unguium: Secondary | ICD-10-CM | POA: Diagnosis not present

## 2018-08-24 ENCOUNTER — Ambulatory Visit (INDEPENDENT_AMBULATORY_CARE_PROVIDER_SITE_OTHER): Payer: Medicare Other | Admitting: Family Medicine

## 2018-08-24 ENCOUNTER — Encounter: Payer: Self-pay | Admitting: Family Medicine

## 2018-08-24 VITALS — BP 142/79 | HR 72 | Temp 98.0°F | Ht 67.0 in | Wt 129.0 lb

## 2018-08-24 DIAGNOSIS — M545 Low back pain: Secondary | ICD-10-CM | POA: Diagnosis not present

## 2018-08-24 DIAGNOSIS — I25119 Atherosclerotic heart disease of native coronary artery with unspecified angina pectoris: Secondary | ICD-10-CM | POA: Diagnosis not present

## 2018-08-24 DIAGNOSIS — I700 Atherosclerosis of aorta: Secondary | ICD-10-CM | POA: Diagnosis not present

## 2018-08-24 DIAGNOSIS — E559 Vitamin D deficiency, unspecified: Secondary | ICD-10-CM

## 2018-08-24 DIAGNOSIS — E7800 Pure hypercholesterolemia, unspecified: Secondary | ICD-10-CM

## 2018-08-24 DIAGNOSIS — M8008XA Age-related osteoporosis with current pathological fracture, vertebra(e), initial encounter for fracture: Secondary | ICD-10-CM

## 2018-08-24 DIAGNOSIS — K219 Gastro-esophageal reflux disease without esophagitis: Secondary | ICD-10-CM | POA: Diagnosis not present

## 2018-08-24 DIAGNOSIS — G8929 Other chronic pain: Secondary | ICD-10-CM

## 2018-08-24 NOTE — Progress Notes (Signed)
Subjective:    Patient ID: Misty [name], female    DOB: 29-Nov-1940, 77 y.o.   MRN: [id number]  HPI Pt here for follow up and management of chronic medical problems which includes hyperlipidemia and gerd. She is taking medication regularly.  The patient has no specific complaints today.  When asked how she was doing she said basically nothing new.  Unknown if she continues to have issues with her intestinal tract and she is been followed closely by the gastroenterologist who is given her the most exquisite attention for trying to get a handle on her absorption issues.  She is requesting a sign a handicap parking form which we will do.  She comes to the visit today with her husband who will be seen separately.  The patient is pleasant and seems to be calmer today than usual.  She has been dealing with a lot over the years chronic abdominal pain anemia back pain with frequent vertebral fractures loss of a son and husband who has memory problems.  She actually seems to be pretty calm today.  She has been seeing the ophthalmologist and has macular degeneration bilaterally.  She has a slight cough but otherwise no chest pain.  She has shortness of breath but mostly secondary to weakness and giving out physically.  She occasionally has some vomiting and continues to have black stool secondary to taking iron and her anemia.  The gastroenterologist follows her closely and plans to get another CBC in 4 weeks.  The last CBC that he did on her had a hemoglobin closer to 11.  She is passing her water well other than having frequency.  All in all everything is stable.  She is very sedentary because of not wanting anything to happen to her back and her last vertebroplasty by Dr. Carloyn [name] was sometime in June of this year.    Patient Active Problem List   Diagnosis Date Noted  . Pulmonary fibrosis ([place]) 04/07/2018  . Non-intractable vomiting with nausea 10/06/2017  . Malnutrition of moderate degree 01/29/2016  .  Symptomatic anemia 01/27/2016  . Generalized weakness 01/27/2016  . Abnormal chest x-ray 01/27/2016  . Tobacco use disorder 01/27/2016  . Colitis   . Hematochezia 02/26/2015  . Ischemic colitis ([place]) 02/26/2015  . Orthostatic hypotension 02/26/2015  . Non-traumatic compression fracture of T11 thoracic vertebra 07/08/2014  . Hyperlipidemia 08/19/2013  . Fibrocystic breast disease 08/19/2013  . History of migraine headaches 08/19/2013  . GERD (gastroesophageal reflux disease) 04/07/2013  . Osteoporosis, postmenopausal 04/07/2013   Outpatient Encounter Medications as of 08/24/2018  Medication Sig  . acetaminophen (TYLENOL) 500 MG tablet Take 500 mg by mouth every 6 (six) hours as needed for moderate pain or headache.   . albuterol (PROVENTIL HFA;VENTOLIN HFA) 108 (90 Base) MCG/ACT inhaler Inhale 2 puffs into the lungs every 6 (six) hours as needed for wheezing or shortness of breath (cough).  . ALPRAZolam (XANAX) 0.5 MG tablet TAKE  (1)  TABLET  THREE TIMES DAILY AS NEEDED.  . cholecalciferol (VITAMIN D) 1000 UNITS tablet Take 2,000-4,000 Units by mouth daily. Take 2000 units daily except take 4000 units on Saturday and Sunday.  . escitalopram (LEXAPRO) 10 MG tablet Take 2 tablets (20 mg total) by mouth at bedtime. Take 1 20 mg tablet by mouth daily at bedtime.  . FeFum-FePoly-FA-B Cmp-C-Biot (INTEGRA PLUS) CAPS Take 1 capsule by mouth daily.  Marland Kitchen loperamide (IMODIUM) 2 MG capsule Take 1 capsule (2 mg total) by mouth daily as needed  for diarrhea or loose stools.  . Multiple Vitamin (MULTIVITAMIN WITH MINERALS) TABS tablet Take 1 tablet by mouth daily. Centrum Silver  . pantoprazole (PROTONIX) 40 MG tablet Take 1 tablet (40 mg total) by mouth 2 (two) times daily before a meal.  . polyethylene glycol powder (GLYCOLAX/MIRALAX) powder Take 8.5 g by mouth daily. (Patient taking differently: Take 8.5 g by mouth daily as needed (for constipation.). )  . ranitidine (ZANTAC) 300 MG tablet Take 300 mg by  mouth daily as needed for heartburn.   . vitamin B-12 (CYANOCOBALAMIN) 1000 MCG tablet Take 1,000 mcg by mouth daily.  . [DISCONTINUED] doxycycline (VIBRA-TABS) 100 MG tablet Take 1 tablet (100 mg total) by mouth 2 (two) times daily. 1 po bid  . [DISCONTINUED] HYDROcodone-acetaminophen (NORCO/VICODIN) 5-325 MG tablet Take 1 tablet by mouth every 6 (six) hours as needed for moderate pain.   No facility-administered encounter medications on file as of 08/24/2018.       Review of Systems  Constitutional: Negative.   HENT: Negative.   Eyes: Negative.   Respiratory: Negative.   Cardiovascular: Negative.   Gastrointestinal: Negative.   Endocrine: Negative.   Genitourinary: Negative.   Musculoskeletal: Positive for back pain (another recent back surgery = Dr Misty [name]).  Skin: Negative.   Allergic/Immunologic: Negative.   Neurological: Negative.   Hematological: Negative.   Psychiatric/Behavioral: Negative.        Objective:   Physical Exam  Constitutional: She is oriented to person, place, and time. She appears well-developed and well-nourished. No distress.  The patient is pleasant and calm despite her ongoing chronic back pain.  HENT:  [name] Normocephalic and atraumatic.  Right Ear: External ear normal.  Left Ear: External ear normal.  Nose: Nose normal.  Mouth/Throat: Oropharynx is clear and moist. No oropharyngeal exudate.  Eyes: Pupils are equal, round, and reactive to light. Conjunctivae and EOM are normal. Right eye exhibits no discharge. Left eye exhibits no discharge. No scleral icterus.  Neck: Normal range of motion. Neck supple. No thyromegaly present.  No bruits thyromegaly or anterior cervical adenopathy  Cardiovascular: Normal rate, regular rhythm, normal heart sounds and intact distal pulses.  No murmur heard. Heart is regular at 72/min  Pulmonary/Chest: Effort normal. No respiratory distress. She has wheezes. She has no rales.  The patient has some rhonchi with deep  breathing.  With coughing they do not clear.  Abdominal: Soft. Bowel sounds are normal. She exhibits no mass. There is no tenderness.  No abdominal tenderness masses or bruits  Musculoskeletal: Normal range of motion. She exhibits deformity. She exhibits no tenderness.  The patient does have some kyphosis in the thoracic or lumbar region.  This is where she has had vertebral plasties and surgeries.  Lymphadenopathy:    She has no cervical adenopathy.  Neurological: She is alert and oriented to person, place, and time. She has normal reflexes. No cranial nerve deficit.  1+ and equal bilaterally  Skin: Skin is warm and dry. No rash noted.  Psychiatric: She has a normal mood and affect. Her behavior is normal. Judgment and thought content normal.  The patient's mood affect and behavior seems stable for her and the fact that she is not in severe pain makes her be less anxious.  Nursing note and vitals reviewed.   BP (!) 142/79 (BP Location: Left Arm)   Pulse 72   Temp 98 F (36.7 C) (Oral)   Ht 5' 7" (1.702 m)   Wt 129 lb (58.5 kg)   BMI  20.20 kg/m        Assessment & Plan:  1. Pure hypercholesterolemia -Continue as aggressive therapeutic lifestyle changes as possible and trying to eat a healthy diet. - BMP8+EGFR - CBC with Differential/Platelet - Lipid panel  2. Aortic atherosclerosis ([employer]) -The patient has hyperlipidemia but is intolerant of statins. - CBC with Differential/Platelet - Lipid panel  3. Vitamin D deficiency -She has had multiple vertebral plasties from collapsed vertebrae.  She has had surgery for these. - CBC with Differential/Platelet - VITAMIN D 25 Hydroxy (Vit-D Deficiency, Fractures)  4. Gastroesophageal reflux disease, esophagitis presence not specified -Follow-up with gastroenterology as planned - CBC with Differential/Platelet - Hepatic function panel  5. Fracture of vertebra due to osteoporosis, initial encounter [facility]) -Move slowly use cane and  follow-up with neurosurgery as needed  6. Chronic bilateral low back pain without sciatica -Walk, but moves slowly and use cane for support and gait stability.  No orders of the defined types were placed in this encounter.  Patient Instructions                       Medicare Annual Wellness Visit  [facility] and the medical providers at [facility] strive to bring you the best medical care.  In doing so we not only want to address your current medical conditions and concerns but also to detect new conditions early and prevent illness, disease and health-related problems.    Medicare offers a yearly Wellness Visit which allows our clinical staff to assess your need for preventative services including immunizations, lifestyle education, counseling to decrease risk of preventable diseases and screening for fall risk and other medical concerns.    This visit is provided free of charge (no copay) for all Medicare recipients. The clinical pharmacists at [facility] have begun to conduct these Wellness Visits which will also include a thorough review of all your medications.    As you primary medical provider recommend that you make an appointment for your Annual Wellness Visit if you have not done so already this year.  You may set up this appointment before you leave today or you may call back ([phone]) and schedule an appointment.  Please make sure when you call that you mention that you are scheduling your Annual Wellness Visit with the clinical pharmacist so that the appointment may be made for the proper length of time.     Continue current medications. Continue good therapeutic lifestyle changes which include good diet and exercise. Fall precautions discussed with patient. If an FOBT was given today- please return it to our front desk. If you are over 64 years old - you may need Prevnar 54 or the adult Pneumonia vaccine.  **Flu shots are  available--- please call and schedule a FLU-CLINIC appointment**  After your visit with [place] today you will receive a survey in the mail or online from [employer] regarding your care with [place]. Please take a moment to fill this out. Your feedback is very important to [place] as you can help [place] better understand your patient needs as well as improve your experience and satisfaction. WE CARE ABOUT YOU!!!   Follow-up with neurosurgery and gastroenterology as planned Seriously consider using a cane when walking Moves slowly and being careful not to fall. Flu shot that you received today may make your arm sore Please get your daughter-in-law to take you to visit some extended care facilities and it might be a good  idea to choose one that has more advanced care so that if either 1 to be get sick then you do not have to be separated. The patient should make sure that she uses a cane more regularly to provide gait stability She should continue to be careful and use her cane with walking.  Arrie Senate MD

## 2018-08-24 NOTE — Patient Instructions (Addendum)
Medicare Annual Wellness Visit  [facility] and the medical providers at [facility] strive to bring you the best medical care.  In doing so we not only want to address your current medical conditions and concerns but also to detect new conditions early and prevent illness, disease and health-related problems.    Medicare offers a yearly Wellness Visit which allows our clinical staff to assess your need for preventative services including immunizations, lifestyle education, counseling to decrease risk of preventable diseases and screening for fall risk and other medical concerns.    This visit is provided free of charge (no copay) for all Medicare recipients. The clinical pharmacists at [facility] have begun to conduct these Wellness Visits which will also include a thorough review of all your medications.    As you primary medical provider recommend that you make an appointment for your Annual Wellness Visit if you have not done so already this year.  You may set up this appointment before you leave today or you may call back ([phone]) and schedule an appointment.  Please make sure when you call that you mention that you are scheduling your Annual Wellness Visit with the clinical pharmacist so that the appointment may be made for the proper length of time.     Continue current medications. Continue good therapeutic lifestyle changes which include good diet and exercise. Fall precautions discussed with patient. If an FOBT was given today- please return it to our front desk. If you are over 77 years old - you may need Prevnar 13 or the adult Pneumonia vaccine.  **Flu shots are available--- please call and schedule a FLU-CLINIC appointment**  After your visit with [place] today you will receive a survey in the mail or online from [employer] regarding your care with [place]. Please take a moment to fill this out. Your feedback is very  important to [place] as you can help [place] better understand your patient needs as well as improve your experience and satisfaction. WE CARE ABOUT YOU!!!   Follow-up with neurosurgery and gastroenterology as planned Seriously consider using a cane when walking Moves slowly and being careful not to fall. Flu shot that you received today may make your arm sore Please get your daughter-in-law to take you to visit some extended care facilities and it might be a good idea to choose one that has more advanced care so that if either 1 to be get sick then you do not have to be separated. The patient should make sure that she uses a cane more regularly to provide gait stability She should continue to be careful and use her cane with walking.

## 2018-08-25 LAB — CBC WITH DIFFERENTIAL/PLATELET
BASOS: 1 %
Basophils Absolute: 0.1 10*3/uL (ref 0.0–0.2)
EOS (ABSOLUTE): 0.6 10*3/uL — ABNORMAL HIGH (ref 0.0–0.4)
EOS: 8 %
HEMOGLOBIN: 11.6 g/dL (ref 11.1–15.9)
Hematocrit: 36.9 % (ref 34.0–46.6)
IMMATURE GRANS (ABS): 0 10*3/uL (ref 0.0–0.1)
IMMATURE GRANULOCYTES: 0 %
LYMPHS ABS: 1.3 10*3/uL (ref 0.7–3.1)
Lymphs: 17 %
MCH: 28.7 pg (ref 26.6–33.0)
MCHC: 31.4 g/dL — ABNORMAL LOW (ref 31.5–35.7)
MCV: 91 fL (ref 79–97)
MONOS ABS: 0.5 10*3/uL (ref 0.1–0.9)
Monocytes: 7 %
Neutrophils Absolute: 5.2 10*3/uL (ref 1.4–7.0)
Neutrophils: 67 %
Platelets: 188 10*3/uL (ref 150–450)
RBC: 4.04 x10E6/uL (ref 3.77–5.28)
RDW: 15.8 % — ABNORMAL HIGH (ref 12.3–15.4)
WBC: 7.7 10*3/uL (ref 3.4–10.8)

## 2018-08-25 LAB — LIPID PANEL
CHOL/HDL RATIO: 2.5 ratio (ref 0.0–4.4)
CHOLESTEROL TOTAL: 154 mg/dL (ref 100–199)
HDL: 62 mg/dL (ref >39)
LDL CALC: 71 mg/dL (ref 0–99)
TRIGLYCERIDES: 104 mg/dL (ref 0–149)
VLDL CHOLESTEROL CAL: 21 mg/dL (ref 5–40)

## 2018-08-25 LAB — HEPATIC FUNCTION PANEL
ALBUMIN: 4.2 g/dL (ref 3.5–4.8)
ALK PHOS: 100 IU/L (ref 39–117)
ALT: 12 IU/L (ref 0–32)
AST: 17 IU/L (ref 0–40)
Bilirubin Total: <0.2 mg/dL (ref 0.0–1.2)
Bilirubin, Direct: 0.08 mg/dL (ref 0.00–0.40)
Total Protein: 6.3 g/dL (ref 6.0–8.5)

## 2018-08-25 LAB — BMP8+EGFR
BUN / CREAT RATIO: 13 (ref 12–28)
BUN: 9 mg/dL (ref 8–27)
CO2: 25 mmol/L (ref 20–29)
CREATININE: 0.67 mg/dL (ref 0.57–1.00)
Calcium: 9 mg/dL (ref 8.7–10.3)
Chloride: 101 mmol/L (ref 96–106)
GFR calc Af Amer: 98 mL/min/{1.73_m2} (ref >59)
GFR, EST NON AFRICAN AMERICAN: 85 mL/min/{1.73_m2} (ref >59)
Glucose: 104 mg/dL — ABNORMAL HIGH (ref 65–99)
Potassium: 4.3 mmol/L (ref 3.5–5.2)
Sodium: 144 mmol/L (ref 134–144)

## 2018-08-25 LAB — VITAMIN D 25 HYDROXY (VIT D DEFICIENCY, FRACTURES): Vit D, 25-Hydroxy: 35.5 ng/mL (ref 30.0–100.0)

## 2018-08-28 ENCOUNTER — Telehealth: Payer: Self-pay | Admitting: *Deleted

## 2018-08-28 NOTE — Telephone Encounter (Signed)
Pt notified of results Verbalizes understanding 

## 2018-08-28 NOTE — Telephone Encounter (Signed)
-----   Message from Chipper Herb, MD sent at 08/25/2018  1:29 PM EDT ----- The vitamin D level is within normal limits and she should continue with current treatment All of the liver function test including the direct bilirubin are within normal limits Please call the patient with these results as well as with results the recommendations that have already been reviewed

## 2018-09-14 ENCOUNTER — Other Ambulatory Visit (INDEPENDENT_AMBULATORY_CARE_PROVIDER_SITE_OTHER): Payer: Self-pay | Admitting: *Deleted

## 2018-09-14 ENCOUNTER — Encounter (INDEPENDENT_AMBULATORY_CARE_PROVIDER_SITE_OTHER): Payer: Self-pay | Admitting: *Deleted

## 2018-09-14 DIAGNOSIS — D649 Anemia, unspecified: Secondary | ICD-10-CM

## 2018-10-08 ENCOUNTER — Other Ambulatory Visit: Payer: Self-pay | Admitting: Family Medicine

## 2018-10-09 DIAGNOSIS — M545 Low back pain, unspecified: Secondary | ICD-10-CM | POA: Insufficient documentation

## 2018-10-09 DIAGNOSIS — M546 Pain in thoracic spine: Secondary | ICD-10-CM

## 2018-10-09 DIAGNOSIS — G8929 Other chronic pain: Secondary | ICD-10-CM | POA: Insufficient documentation

## 2018-10-09 NOTE — Telephone Encounter (Signed)
Last refill 09/18/18

## 2018-10-13 ENCOUNTER — Other Ambulatory Visit: Payer: Self-pay | Admitting: *Deleted

## 2018-10-13 MED ORDER — ALBUTEROL SULFATE HFA 108 (90 BASE) MCG/ACT IN AERS: 2.0000 | INHALATION_SPRAY | Freq: Four times a day (QID) | RESPIRATORY_TRACT | 0 refills | Status: DC | PRN

## 2018-10-14 ENCOUNTER — Encounter: Payer: Self-pay | Admitting: Family Medicine

## 2018-10-14 ENCOUNTER — Ambulatory Visit (INDEPENDENT_AMBULATORY_CARE_PROVIDER_SITE_OTHER): Payer: Medicare Other | Admitting: Family Medicine

## 2018-10-14 VITALS — BP 124/74 | HR 91 | Temp 97.2°F | Ht 67.0 in | Wt 129.6 lb

## 2018-10-14 DIAGNOSIS — J441 Chronic obstructive pulmonary disease with (acute) exacerbation: Secondary | ICD-10-CM | POA: Diagnosis not present

## 2018-10-14 DIAGNOSIS — I25119 Atherosclerotic heart disease of native coronary artery with unspecified angina pectoris: Secondary | ICD-10-CM | POA: Diagnosis not present

## 2018-10-14 MED ORDER — AMOXICILLIN-POT CLAVULANATE 875-125 MG PO TABS: 1.0000 | ORAL_TABLET | Freq: Two times a day (BID) | ORAL | 0 refills | Status: DC

## 2018-10-14 MED ORDER — DEXAMETHASONE SODIUM PHOSPHATE 4 MG/ML IJ SOLN: 4.0000 mg | Freq: Once | INTRAMUSCULAR | Status: DC

## 2018-10-14 MED ORDER — ALBUTEROL SULFATE HFA 108 (90 BASE) MCG/ACT IN AERS: 2.0000 | INHALATION_SPRAY | Freq: Four times a day (QID) | RESPIRATORY_TRACT | 2 refills | Status: DC | PRN

## 2018-10-14 MED ORDER — DEXAMETHASONE 2 MG PO TABS: ORAL_TABLET | ORAL | 0 refills | Status: DC

## 2018-10-14 MED ORDER — DEXAMETHASONE SODIUM PHOSPHATE 10 MG/ML IJ SOLN: 10.0000 mg | Freq: Once | INTRAMUSCULAR | Status: DC

## 2018-10-14 MED ORDER — DEXAMETHASONE SODIUM PHOSPHATE 4 MG/ML IJ SOLN
4.0000 mg | Freq: Once | INTRAMUSCULAR | Status: AC
  Administered 2018-10-14: 4 mg via INTRAMUSCULAR

## 2018-10-14 NOTE — Addendum Note (Signed)
Addended by: Nigel Berthold C on: 10/14/2018 10:16 AM   Modules accepted: Orders

## 2018-10-14 NOTE — Progress Notes (Signed)
BP 124/74   Pulse 91   Temp (!) 97.2 F (36.2 C) (Oral)   Ht 5' 7"  (1.702 m)   Wt 129 lb 9.6 oz (58.8 kg)   SpO2 94%   BMI 20.30 kg/m    Subjective:    Patient ID: Misty [name], female    DOB: Sep 26, 1941, 77 y.o.   MRN: [id number]  HPI: Misty [name] is a 77 y.o. female presenting on 10/14/2018 for Cough (x 1 month and has gotten worse); Nasal Congestion; and Wheezing   HPI Cough and congestion and wheezing  patient is coming in today for cough and congestion and wheezing.  She says is been going on for a month but is worsened over the past couple weeks.  She says she ran out of her albuterol inhaler which did not help things and she did not have a refill for it.  She feels like she has a lot of chest congestion and she feels like she might be getting a pneumonia definitely is having a lot of wheezing.  Patient does admit that she is still smoker and is trying to quit but still smokes a pack per day.  Relevant past medical, surgical, family and social history reviewed and updated as indicated. Interim medical history since our last visit reviewed. Allergies and medications reviewed and updated.  Review of Systems  Constitutional: Negative for chills and fever.  HENT: Positive for congestion and postnasal drip. Negative for ear discharge, ear pain, rhinorrhea, sinus pressure, sneezing and sore throat.   Eyes: Negative for visual disturbance.  Respiratory: Positive for cough and wheezing. Negative for chest tightness and shortness of breath.   Cardiovascular: Negative for chest pain and leg swelling.  Musculoskeletal: Negative for back pain and gait problem.  Skin: Negative for rash.  Neurological: Negative for light-headedness and headaches.  Psychiatric/Behavioral: Negative for agitation and behavioral problems.  All other systems reviewed and are negative.   Per HPI unless specifically indicated above   Allergies as of 10/14/2018      Reactions   Codeine Nausea  And Vomiting   Tramadol Nausea And Vomiting   Asa [aspirin] Other (See Comments)   Patient is unaware of allergy   Azithromycin Other (See Comments)   Patient is unaware of allergy   Celebrex [celecoxib] Other (See Comments)   Irritated stomach   Cymbalta [duloxetine Hcl] Other (See Comments)   Felt groggy   Prednisone Rash   She has seen the podiatrist and he had injected cortisone in her feet and she had also taken a peel at home. She had a severe reaction to her face with a rash and had to take Benadryl to resolve the symptom. She actually did get more cortisone shots, but no additional reactions to the shots.   Vioxx [rofecoxib] Other (See Comments)   Unknown   Zelnorm [tegaserod] Other (See Comments)   Patient is unaware of allergy   Zocor [simvastatin] Other (See Comments)   Patient is unaware of allergy      Medication List        Accurate as of 10/14/18 10:12 AM. Always use your most recent med list.          acetaminophen 500 MG tablet Commonly known as:  TYLENOL Take 500 mg by mouth every 6 (six) hours as needed for moderate pain or headache.   albuterol 108 (90 Base) MCG/ACT inhaler Commonly known as:  PROVENTIL HFA;VENTOLIN HFA Inhale 2 puffs into the lungs every 6 (six) hours  as needed for wheezing or shortness of breath (cough).   ALPRAZolam 0.5 MG tablet Commonly known as:  XANAX TAKE (1) TABLET THREE TIMES DAILY AS NEEDED.   amoxicillin-clavulanate 875-125 MG tablet Commonly known as:  AUGMENTIN Take 1 tablet by mouth 2 (two) times daily.   cholecalciferol 1000 units tablet Commonly known as:  VITAMIN D Take 2,000-4,000 Units by mouth daily. Take 2000 units daily except take 4000 units on Saturday and Sunday.   dexamethasone 2 MG tablet Commonly known as:  DECADRON Take 4 tablets for 3 days then 3 tablets for 3 days then 2 tablets for 3 days then 1 tablet for 3 days   escitalopram 10 MG tablet Commonly known as:  LEXAPRO Take 2 tablets (20 mg  total) by mouth at bedtime. Take 1 20 mg tablet by mouth daily at bedtime.   INTEGRA PLUS Caps Take 1 capsule by mouth daily.   loperamide 2 MG capsule Commonly known as:  IMODIUM Take 1 capsule (2 mg total) by mouth daily as needed for diarrhea or loose stools.   multivitamin with minerals Tabs tablet Take 1 tablet by mouth daily. Centrum Silver   pantoprazole 40 MG tablet Commonly known as:  PROTONIX Take 1 tablet (40 mg total) by mouth 2 (two) times daily before a meal.   polyethylene glycol powder powder Commonly known as:  GLYCOLAX/MIRALAX Take 8.5 g by mouth daily.   ranitidine 300 MG tablet Commonly known as:  ZANTAC Take 300 mg by mouth daily as needed for heartburn.   vitamin B-12 1000 MCG tablet Commonly known as:  CYANOCOBALAMIN Take 1,000 mcg by mouth daily.          Objective:    BP 124/74   Pulse 91   Temp (!) 97.2 F (36.2 C) (Oral)   Ht 5' 7"  (1.702 m)   Wt 129 lb 9.6 oz (58.8 kg)   SpO2 94%   BMI 20.30 kg/m   Wt Readings from Last 3 Encounters:  10/14/18 129 lb 9.6 oz (58.8 kg)  08/24/18 129 lb (58.5 kg)  05/15/18 124 lb 9.6 oz (56.5 kg)    Physical Exam  Constitutional: She is oriented to person, place, and time. She appears well-developed and well-nourished. No distress.  HENT:  Right Ear: Tympanic membrane, external ear and ear canal normal.  Left Ear: Tympanic membrane, external ear and ear canal normal.  Nose: No mucosal edema or rhinorrhea. No epistaxis. Right sinus exhibits no maxillary sinus tenderness and no frontal sinus tenderness. Left sinus exhibits no maxillary sinus tenderness and no frontal sinus tenderness.  Mouth/Throat: Uvula is midline and mucous membranes are normal. No oropharyngeal exudate, posterior oropharyngeal edema, posterior oropharyngeal erythema or tonsillar abscesses.  Eyes: Conjunctivae are normal.  Cardiovascular: Normal rate, regular rhythm, normal heart sounds and intact distal pulses.  No murmur  heard. Pulmonary/Chest: Effort normal. No respiratory distress. She has wheezes in the right upper field, the right middle field, the right lower field, the left upper field, the left middle field and the left lower field. She has rhonchi in the right upper field, the right middle field, the right lower field, the left upper field, the left middle field and the left lower field. She has no rales.  Musculoskeletal: Normal range of motion. She exhibits no edema or tenderness.  Neurological: She is alert and oriented to person, place, and time. Coordination normal.  Skin: Skin is warm and dry. No rash noted. She is not diaphoretic.  Psychiatric: She has a  normal mood and affect. Her behavior is normal.  Vitals reviewed.       Assessment & Plan:   Problem List Items Addressed This Visit    None    Visit Diagnoses    COPD exacerbation ([place])    -  Primary   Relevant Medications   albuterol (PROVENTIL HFA;VENTOLIN HFA) 108 (90 Base) MCG/ACT inhaler   dexamethasone (DECADRON) injection 4 mg   dexamethasone (DECADRON) 2 MG tablet      Gave dexamethasone and will watch her for 15 minutes to make sure she does not have a reaction to it, this is because of her history with the prednisone, I do think this is a COPD exacerbation and really the steroids would benefit if we can find one that will work for her without causing a reaction.  I sent dexamethasone which is what we gave her an injection of here in the office, we watched her for 15 minutes and she tolerated it well without any reaction. Follow up plan: Return if symptoms worsen or fail to improve.  Counseling provided for all of the vaccine components No orders of the defined types were placed in this encounter.   Caryl Pina, MD [facility] 10/14/2018, 10:12 AM

## 2018-10-27 ENCOUNTER — Other Ambulatory Visit: Payer: Self-pay | Admitting: Family Medicine

## 2018-11-04 ENCOUNTER — Telehealth: Payer: Self-pay | Admitting: Family Medicine

## 2018-11-04 NOTE — Telephone Encounter (Signed)
Please prescribe Mycostatin oral suspension swish 1 teaspoon in mouth after eating and at bedtime and expectorate.  Give 200 cc with 1 refill.

## 2018-11-06 MED ORDER — NYSTATIN 100000 UNIT/ML MT SUSP: 5.0000 mL | Freq: Four times a day (QID) | OROMUCOSAL | 0 refills | Status: DC

## 2018-11-06 NOTE — Telephone Encounter (Signed)
Med sent to pharm 

## 2018-11-16 ENCOUNTER — Other Ambulatory Visit (INDEPENDENT_AMBULATORY_CARE_PROVIDER_SITE_OTHER): Payer: Self-pay | Admitting: Internal Medicine

## 2018-11-26 DIAGNOSIS — B351 Tinea unguium: Secondary | ICD-10-CM | POA: Diagnosis not present

## 2018-11-26 DIAGNOSIS — M79676 Pain in unspecified toe(s): Secondary | ICD-10-CM | POA: Diagnosis not present

## 2018-12-02 DIAGNOSIS — S32020A Wedge compression fracture of second lumbar vertebra, initial encounter for closed fracture: Secondary | ICD-10-CM | POA: Diagnosis not present

## 2018-12-02 DIAGNOSIS — S22060A Wedge compression fracture of T7-T8 vertebra, initial encounter for closed fracture: Secondary | ICD-10-CM | POA: Diagnosis not present

## 2018-12-02 DIAGNOSIS — S22089D Unspecified fracture of T11-T12 vertebra, subsequent encounter for fracture with routine healing: Secondary | ICD-10-CM | POA: Diagnosis not present

## 2018-12-02 DIAGNOSIS — S22069A Unspecified fracture of T7-T8 vertebra, initial encounter for closed fracture: Secondary | ICD-10-CM | POA: Diagnosis not present

## 2018-12-02 DIAGNOSIS — M47814 Spondylosis without myelopathy or radiculopathy, thoracic region: Secondary | ICD-10-CM | POA: Diagnosis not present

## 2018-12-02 DIAGNOSIS — S32019D Unspecified fracture of first lumbar vertebra, subsequent encounter for fracture with routine healing: Secondary | ICD-10-CM | POA: Diagnosis not present

## 2018-12-04 DIAGNOSIS — S22060A Wedge compression fracture of T7-T8 vertebra, initial encounter for closed fracture: Secondary | ICD-10-CM | POA: Diagnosis not present

## 2018-12-04 DIAGNOSIS — S32020A Wedge compression fracture of second lumbar vertebra, initial encounter for closed fracture: Secondary | ICD-10-CM | POA: Diagnosis not present

## 2018-12-08 DIAGNOSIS — S22060A Wedge compression fracture of T7-T8 vertebra, initial encounter for closed fracture: Secondary | ICD-10-CM | POA: Diagnosis not present

## 2018-12-08 DIAGNOSIS — Z90710 Acquired absence of both cervix and uterus: Secondary | ICD-10-CM | POA: Diagnosis not present

## 2018-12-08 DIAGNOSIS — F17200 Nicotine dependence, unspecified, uncomplicated: Secondary | ICD-10-CM | POA: Diagnosis not present

## 2018-12-08 DIAGNOSIS — S32020A Wedge compression fracture of second lumbar vertebra, initial encounter for closed fracture: Secondary | ICD-10-CM | POA: Diagnosis not present

## 2018-12-08 DIAGNOSIS — Z886 Allergy status to analgesic agent status: Secondary | ICD-10-CM | POA: Diagnosis not present

## 2018-12-08 DIAGNOSIS — F17210 Nicotine dependence, cigarettes, uncomplicated: Secondary | ICD-10-CM | POA: Diagnosis not present

## 2018-12-08 DIAGNOSIS — M8588 Other specified disorders of bone density and structure, other site: Secondary | ICD-10-CM | POA: Diagnosis not present

## 2018-12-08 DIAGNOSIS — Z8249 Family history of ischemic heart disease and other diseases of the circulatory system: Secondary | ICD-10-CM | POA: Diagnosis not present

## 2018-12-08 DIAGNOSIS — K219 Gastro-esophageal reflux disease without esophagitis: Secondary | ICD-10-CM | POA: Diagnosis not present

## 2018-12-08 DIAGNOSIS — Z79899 Other long term (current) drug therapy: Secondary | ICD-10-CM | POA: Diagnosis not present

## 2018-12-08 DIAGNOSIS — Z888 Allergy status to other drugs, medicaments and biological substances status: Secondary | ICD-10-CM | POA: Diagnosis not present

## 2018-12-08 DIAGNOSIS — M1990 Unspecified osteoarthritis, unspecified site: Secondary | ICD-10-CM | POA: Diagnosis not present

## 2018-12-08 DIAGNOSIS — M810 Age-related osteoporosis without current pathological fracture: Secondary | ICD-10-CM | POA: Diagnosis not present

## 2018-12-08 DIAGNOSIS — F419 Anxiety disorder, unspecified: Secondary | ICD-10-CM | POA: Diagnosis not present

## 2018-12-09 DIAGNOSIS — S22060A Wedge compression fracture of T7-T8 vertebra, initial encounter for closed fracture: Secondary | ICD-10-CM | POA: Diagnosis not present

## 2018-12-09 DIAGNOSIS — Z4889 Encounter for other specified surgical aftercare: Secondary | ICD-10-CM | POA: Diagnosis not present

## 2018-12-09 DIAGNOSIS — M4854XA Collapsed vertebra, not elsewhere classified, thoracic region, initial encounter for fracture: Secondary | ICD-10-CM | POA: Diagnosis not present

## 2018-12-09 DIAGNOSIS — F419 Anxiety disorder, unspecified: Secondary | ICD-10-CM | POA: Diagnosis not present

## 2018-12-09 DIAGNOSIS — M1990 Unspecified osteoarthritis, unspecified site: Secondary | ICD-10-CM | POA: Diagnosis not present

## 2018-12-09 DIAGNOSIS — M4856XA Collapsed vertebra, not elsewhere classified, lumbar region, initial encounter for fracture: Secondary | ICD-10-CM | POA: Diagnosis not present

## 2018-12-09 DIAGNOSIS — Z981 Arthrodesis status: Secondary | ICD-10-CM | POA: Diagnosis not present

## 2018-12-09 DIAGNOSIS — M810 Age-related osteoporosis without current pathological fracture: Secondary | ICD-10-CM | POA: Diagnosis not present

## 2018-12-09 DIAGNOSIS — S32020A Wedge compression fracture of second lumbar vertebra, initial encounter for closed fracture: Secondary | ICD-10-CM | POA: Diagnosis not present

## 2018-12-09 DIAGNOSIS — K219 Gastro-esophageal reflux disease without esophagitis: Secondary | ICD-10-CM | POA: Diagnosis not present

## 2018-12-10 DIAGNOSIS — S32020A Wedge compression fracture of second lumbar vertebra, initial encounter for closed fracture: Secondary | ICD-10-CM | POA: Diagnosis not present

## 2018-12-10 DIAGNOSIS — S22060A Wedge compression fracture of T7-T8 vertebra, initial encounter for closed fracture: Secondary | ICD-10-CM | POA: Diagnosis not present

## 2018-12-10 DIAGNOSIS — M810 Age-related osteoporosis without current pathological fracture: Secondary | ICD-10-CM | POA: Diagnosis not present

## 2018-12-10 DIAGNOSIS — K219 Gastro-esophageal reflux disease without esophagitis: Secondary | ICD-10-CM | POA: Diagnosis not present

## 2018-12-10 DIAGNOSIS — M1990 Unspecified osteoarthritis, unspecified site: Secondary | ICD-10-CM | POA: Diagnosis not present

## 2018-12-10 DIAGNOSIS — F419 Anxiety disorder, unspecified: Secondary | ICD-10-CM | POA: Diagnosis not present

## 2018-12-30 ENCOUNTER — Ambulatory Visit (INDEPENDENT_AMBULATORY_CARE_PROVIDER_SITE_OTHER): Payer: Medicare Other | Admitting: Family Medicine

## 2018-12-30 ENCOUNTER — Encounter: Payer: Self-pay | Admitting: Family Medicine

## 2018-12-30 VITALS — BP 112/74 | HR 145 | Temp 97.6°F | Ht 67.0 in | Wt 132.0 lb

## 2018-12-30 DIAGNOSIS — E559 Vitamin D deficiency, unspecified: Secondary | ICD-10-CM

## 2018-12-30 DIAGNOSIS — E7800 Pure hypercholesterolemia, unspecified: Secondary | ICD-10-CM

## 2018-12-30 DIAGNOSIS — I700 Atherosclerosis of aorta: Secondary | ICD-10-CM

## 2018-12-30 DIAGNOSIS — K219 Gastro-esophageal reflux disease without esophagitis: Secondary | ICD-10-CM | POA: Diagnosis not present

## 2018-12-30 MED ORDER — ALBUTEROL SULFATE HFA 108 (90 BASE) MCG/ACT IN AERS: 2.0000 | INHALATION_SPRAY | Freq: Four times a day (QID) | RESPIRATORY_TRACT | 6 refills | Status: DC | PRN

## 2018-12-30 MED ORDER — NYSTATIN 100000 UNIT/ML MT SUSP: 5.0000 mL | Freq: Four times a day (QID) | OROMUCOSAL | 3 refills | Status: DC

## 2018-12-30 MED ORDER — ALPRAZOLAM 0.5 MG PO TABS: 0.5000 mg | ORAL_TABLET | Freq: Three times a day (TID) | ORAL | 5 refills | Status: DC | PRN

## 2018-12-30 NOTE — Patient Instructions (Addendum)
Medicare Annual Wellness Visit  [facility] and the medical providers at [facility] strive to bring you the best medical care.  In doing so we not only want to address your current medical conditions and concerns but also to detect new conditions early and prevent illness, disease and health-related problems.    Medicare offers a yearly Wellness Visit which allows our clinical staff to assess your need for preventative services including immunizations, lifestyle education, counseling to decrease risk of preventable diseases and screening for fall risk and other medical concerns.    This visit is provided free of charge (no copay) for all Medicare recipients. The clinical pharmacists at [facility] have begun to conduct these Wellness Visits which will also include a thorough review of all your medications.    As you primary medical provider recommend that you make an appointment for your Annual Wellness Visit if you have not done so already this year.  You may set up this appointment before you leave today or you may call back ([phone]) and schedule an appointment.  Please make sure when you call that you mention that you are scheduling your Annual Wellness Visit with the clinical pharmacist so that the appointment may be made for the proper length of time.    Continue current medications. Continue good therapeutic lifestyle changes which include good diet and exercise. Fall precautions discussed with patient. If an FOBT was given today- please return it to our front desk. If you are over 32 years old - you may need Prevnar 72 or the adult Pneumonia vaccine.  **Flu shots are available--- please call and schedule a FLU-CLINIC appointment**  After your visit with [place] today you will receive a survey in the mail or online from [employer] regarding your care with [place]. Please take a moment to fill this out. Your feedback is very  important to [place] as you can help [place] better understand your patient needs as well as improve your experience and satisfaction. WE CARE ABOUT YOU!!!   Continue to follow-up with Dr. Carloyn Manner or neurosurgery as needed Continue to follow-up with gastroenterology as needed Continue to be careful and try to walk as much as possible with support to get your strength back Continue to be careful and not put yourself at risk for falling Continue to drink plenty of water and stay well-hydrated Avoid climbing and falls.

## 2018-12-30 NOTE — Progress Notes (Signed)
Subjective:    Patient ID: Misty [name], female    DOB: [birth year], 78 y.o.   MRN: [id number]  HPI Pt here for follow up and management of chronic medical problems which includes hyperlipidemia. She is taking medication regularly.  Seems to be doing better today and has no specific complaints she is has had more surgery on her back and vertebroplasty's.  She sees Dr. Carloyn Manner for this who is going to be retiring in a couple months.  He can pretty much describe her with chronic back pain and vertebral fractures and chronic ongoing protein caloric malnutrition secondary to persistent vomiting and loose bowel movements.  She is been followed regularly by the neurosurgeon with recent vertebroplasty and she continues to be followed regularly by her gastroenterologist is been so good to her and knows her so well.  She also has been diagnosed with she denies any chest pain or shortness of breath anymore than usual.  She remains very weak in her lower extremities and knows that she needs to get out and walk more.  She has to be very careful and her husband is very supportive her when she goes out.  Currently her bowels are moving regularly and she is not having any vomiting.  She also has been diagnosed with ischemic colitis in the past.  She sees Dr. Jearld Adjutant about every 3 to 4 months.  She is passing her water well.  She is on chronic iron replacement.  She will get blood work today and will make sure that her gastroenterologist gets a copy of this.  She is up-to-date on her eye exams.  Her mood seems to be little bit more positive today than usual.  She has pulmonary fibrosis.     Patient Active Problem List   Diagnosis Date Noted  . Pulmonary fibrosis ([place]) 04/07/2018  . Non-intractable vomiting with nausea 10/06/2017  . Malnutrition of moderate degree 01/29/2016  . Symptomatic anemia 01/27/2016  . Generalized weakness 01/27/2016  . Abnormal chest x-ray 01/27/2016  . Tobacco use disorder 01/27/2016  .  Colitis   . Hematochezia 02/26/2015  . Ischemic colitis ([facility]) 02/26/2015  . Orthostatic hypotension 02/26/2015  . Non-traumatic compression fracture of T11 thoracic vertebra 07/08/2014  . Hyperlipidemia 08/19/2013  . Fibrocystic breast disease 08/19/2013  . History of migraine headaches 08/19/2013  . GERD (gastroesophageal reflux disease) 04/07/2013  . Osteoporosis, postmenopausal 04/07/2013   Outpatient Encounter Medications as of 12/30/2018  Medication Sig  . acetaminophen (TYLENOL) 500 MG tablet Take 500 mg by mouth every 6 (six) hours as needed for moderate pain or headache.   . albuterol (PROVENTIL HFA;VENTOLIN HFA) 108 (90 Base) MCG/ACT inhaler Inhale 2 puffs into the lungs every 6 (six) hours as needed for wheezing or shortness of breath (cough).  . ALPRAZolam (XANAX) 0.5 MG tablet TAKE (1) TABLET THREE TIMES DAILY AS NEEDED.  . cholecalciferol (VITAMIN D) 1000 UNITS tablet Take 2,000-4,000 Units by mouth daily. Take 2000 units daily except take 4000 units on Saturday and Sunday.  . escitalopram (LEXAPRO) 20 MG tablet TAKE ONE TABLET AT BEDTIME  . FeFum-FePoly-FA-B Cmp-C-Biot (INTEGRA PLUS) CAPS Take 1 capsule by mouth daily.  . Multiple Vitamin (MULTIVITAMIN WITH MINERALS) TABS tablet Take 1 tablet by mouth daily. Centrum Silver  . nystatin (MYCOSTATIN) 100000 UNIT/ML suspension Take 5 mLs (500,000 Units total) by mouth 4 (four) times daily. Swish and spit  . pantoprazole (PROTONIX) 40 MG tablet Take 1 tablet (40 mg total) by mouth 2 (two) times daily  before a meal.  . vitamin B-12 (CYANOCOBALAMIN) 1000 MCG tablet Take 1,000 mcg by mouth daily.  Marland Kitchen loperamide (IMODIUM) 2 MG capsule Take 1 capsule (2 mg total) by mouth daily as needed for diarrhea or loose stools. (Patient not taking: Reported on 12/30/2018)  . polyethylene glycol powder (GLYCOLAX/MIRALAX) powder Take 8.5 g by mouth daily. (Patient not taking: Reported on 12/30/2018)  . [DISCONTINUED] amoxicillin-clavulanate (AUGMENTIN)  875-125 MG tablet Take 1 tablet by mouth 2 (two) times daily.  . [DISCONTINUED] dexamethasone (DECADRON) 2 MG tablet Take 4 tablets for 3 days then 3 tablets for 3 days then 2 tablets for 3 days then 1 tablet for 3 days  . [DISCONTINUED] ranitidine (ZANTAC) 300 MG tablet Take 300 mg by mouth daily as needed for heartburn.    Facility-Administered Encounter Medications as of 12/30/2018  Medication  . dexamethasone (DECADRON) injection 4 mg     Review of Systems  Constitutional: Negative.   HENT: Negative.   Eyes: Negative.   Respiratory: Negative.   Cardiovascular: Negative.   Gastrointestinal: Negative.   Endocrine: Negative.   Genitourinary: Negative.   Musculoskeletal: Negative.   Skin: Negative.   Allergic/Immunologic: Negative.   Neurological: Negative.   Hematological: Negative.   Psychiatric/Behavioral: Negative.        Objective:   Physical Exam Vitals signs and nursing note reviewed.  Constitutional:      General: She is not in acute distress.    Appearance: She is well-developed.     Comments: Patient is pleasant and seems to be in better spirits today because of less back pain and more normal bowel movements.  HENT:     [name] Normocephalic and atraumatic.     Right Ear: Tympanic membrane, ear canal and external ear normal. There is no impacted cerumen.     Left Ear: Tympanic membrane, ear canal and external ear normal. There is no impacted cerumen.     Nose: Nose normal. No congestion.     Mouth/Throat:     Mouth: Mucous membranes are moist.     Pharynx: Oropharynx is clear. No oropharyngeal exudate.  Eyes:     General: No scleral icterus.       Right eye: No discharge.        Left eye: No discharge.     Extraocular Movements: Extraocular movements intact.     Conjunctiva/sclera: Conjunctivae normal.     Pupils: Pupils are equal, round, and reactive to light.  Neck:     Musculoskeletal: Normal range of motion and neck supple.     Thyroid: No thyromegaly.      Vascular: No carotid bruit or JVD.     Comments: No bruits thyromegaly or anterior cervical adenopathy Cardiovascular:     Rate and Rhythm: Normal rate and regular rhythm.     Pulses: Normal pulses.     Heart sounds: Normal heart sounds. No murmur.     Comments: The heart is regular at 96/min with no pedal edema and good pedal pulse Pulmonary:     Effort: Pulmonary effort is normal. No respiratory distress.     Breath sounds: No wheezing or rales.     Comments: Some basilar congestion bilaterally that is minimal Abdominal:     General: Abdomen is flat. Bowel sounds are normal.     Palpations: Abdomen is soft. There is no mass.     Tenderness: There is abdominal tenderness.     Comments: masses organ enlargement or bruits not present with only minimal tenderness.  Musculoskeletal: Normal range of motion.        General: Tenderness present.     Right lower leg: No edema.     Left lower leg: No edema.     Comments: General tenderness and spine.  Lymphadenopathy:     Cervical: No cervical adenopathy.  Skin:    General: Skin is warm and dry.     Coloration: Skin is not pale.     Findings: No rash.  Neurological:     General: No focal deficit present.     Mental Status: She is alert and oriented to person, place, and time. Mental status is at baseline.     Cranial Nerves: No cranial nerve deficit.     Sensory: No sensory deficit.     Motor: Weakness present.     Gait: Gait abnormal.     Deep Tendon Reflexes: Reflexes are normal and symmetric. Reflexes normal.     Comments: Generalized weakness and some gait instability  Psychiatric:        Mood and Affect: Mood normal.        Behavior: Behavior normal.        Thought Content: Thought content normal.        Judgment: Judgment normal.     Comments: Fact and behavior are more positive for this patient than in the past.     BP 112/74 (BP Location: Left Arm)   Pulse (!) 145   Temp 97.6 F (36.4 C) (Oral)   Ht '5\' 7"'$  (1.702 m)    Wt 132 lb (59.9 kg)   BMI 20.67 kg/m        Assessment & Plan:  1. Pure hypercholesterolemia -Continue with healthy eating habits - CBC with Differential/Platelet - Lipid panel  2. Aortic atherosclerosis (HCC) -Continue with healthy eating habits. - BMP8+EGFR - CBC with Differential/Platelet - Lipid panel  3. Vitamin D deficiency -Continue with vitamin D replacement and calcium pending results of lab work - CBC with Differential/Platelet - VITAMIN D 25 Hydroxy (Vit-D Deficiency, Fractures)  4. Gastroesophageal reflux disease, esophagitis presence not specified -Continue with current therapy of pantoprazole. - CBC with Differential/Platelet - Hepatic function panel   Meds ordered this encounter  Medications  . nystatin (MYCOSTATIN) 100000 UNIT/ML suspension    Sig: Take 5 mLs (500,000 Units total) by mouth 4 (four) times daily. Swish and spit    Dispense:  60 mL    Refill:  3  . albuterol (PROVENTIL HFA;VENTOLIN HFA) 108 (90 Base) MCG/ACT inhaler    Sig: Inhale 2 puffs into the lungs every 6 (six) hours as needed for wheezing or shortness of breath (cough).    Dispense:  1 Inhaler    Refill:  6  . ALPRAZolam (XANAX) 0.5 MG tablet    Sig: Take 1 tablet (0.5 mg total) by mouth 3 (three) times daily as needed for anxiety.    Dispense:  90 tablet    Refill:  5   Patient Instructions                       Medicare Annual Wellness Visit  [facility] and the medical providers at [facility] strive to bring you the best medical care.  In doing so we not only want to address your current medical conditions and concerns but also to detect new conditions early and prevent illness, disease and health-related problems.    Medicare offers a yearly Wellness Visit which allows our clinical staff  to assess your need for preventative services including immunizations, lifestyle education, counseling to decrease risk of preventable diseases and screening for fall  risk and other medical concerns.    This visit is provided free of charge (no copay) for all Medicare recipients. The clinical pharmacists at [facility] have begun to conduct these Wellness Visits which will also include a thorough review of all your medications.    As you primary medical provider recommend that you make an appointment for your Annual Wellness Visit if you have not done so already this year.  You may set up this appointment before you leave today or you may call back ([phone]) and schedule an appointment.  Please make sure when you call that you mention that you are scheduling your Annual Wellness Visit with the clinical pharmacist so that the appointment may be made for the proper length of time.    Continue current medications. Continue good therapeutic lifestyle changes which include good diet and exercise. Fall precautions discussed with patient. If an FOBT was given today- please return it to our front desk. If you are over 4 years old - you may need Prevnar 38 or the adult Pneumonia vaccine.  **Flu shots are available--- please call and schedule a FLU-CLINIC appointment**  After your visit with [place] today you will receive a survey in the mail or online from [employer] regarding your care with [place]. Please take a moment to fill this out. Your feedback is very important to [place] as you can help [place] better understand your patient needs as well as improve your experience and satisfaction. WE CARE ABOUT YOU!!!   Continue to follow-up with Dr. Carloyn Manner or neurosurgery as needed Continue to follow-up with gastroenterology as needed Continue to be careful and try to walk as much as possible with support to get your strength back Continue to be careful and not put yourself at risk for falling Continue to drink plenty of water and stay well-hydrated Avoid climbing and falls.  Arrie Senate MD

## 2018-12-31 LAB — LIPID PANEL
Chol/HDL Ratio: 2.4 ratio (ref 0.0–4.4)
Cholesterol, Total: 123 mg/dL (ref 100–199)
HDL: 52 mg/dL (ref >39)
LDL Calculated: 53 mg/dL (ref 0–99)
TRIGLYCERIDES: 90 mg/dL (ref 0–149)
VLDL Cholesterol Cal: 18 mg/dL (ref 5–40)

## 2018-12-31 LAB — BMP8+EGFR
BUN/Creatinine Ratio: 11 — ABNORMAL LOW (ref 12–28)
BUN: 9 mg/dL (ref 8–27)
CO2: 25 mmol/L (ref 20–29)
Calcium: 8.6 mg/dL — ABNORMAL LOW (ref 8.7–10.3)
Chloride: 102 mmol/L (ref 96–106)
Creatinine, Ser: 0.8 mg/dL (ref 0.57–1.00)
GFR calc Af Amer: 82 mL/min/{1.73_m2} (ref >59)
GFR calc non Af Amer: 71 mL/min/{1.73_m2} (ref >59)
Glucose: 122 mg/dL — ABNORMAL HIGH (ref 65–99)
Potassium: 3.6 mmol/L (ref 3.5–5.2)
Sodium: 145 mmol/L — ABNORMAL HIGH (ref 134–144)

## 2018-12-31 LAB — CBC WITH DIFFERENTIAL/PLATELET
Basophils Absolute: 0.1 10*3/uL (ref 0.0–0.2)
Basos: 1 %
EOS (ABSOLUTE): 0.8 10*3/uL — ABNORMAL HIGH (ref 0.0–0.4)
Eos: 11 %
Hematocrit: 34.3 % (ref 34.0–46.6)
Hemoglobin: 11 g/dL — ABNORMAL LOW (ref 11.1–15.9)
Immature Grans (Abs): 0 10*3/uL (ref 0.0–0.1)
Immature Granulocytes: 0 %
LYMPHS ABS: 1.2 10*3/uL (ref 0.7–3.1)
Lymphs: 16 %
MCH: 28.7 pg (ref 26.6–33.0)
MCHC: 32.1 g/dL (ref 31.5–35.7)
MCV: 90 fL (ref 79–97)
Monocytes Absolute: 0.4 10*3/uL (ref 0.1–0.9)
Monocytes: 6 %
Neutrophils Absolute: 5.1 10*3/uL (ref 1.4–7.0)
Neutrophils: 66 %
Platelets: 217 10*3/uL (ref 150–450)
RBC: 3.83 x10E6/uL (ref 3.77–5.28)
RDW: 15.1 % (ref 11.7–15.4)
WBC: 7.6 10*3/uL (ref 3.4–10.8)

## 2018-12-31 LAB — VITAMIN D 25 HYDROXY (VIT D DEFICIENCY, FRACTURES): Vit D, 25-Hydroxy: 41.3 ng/mL (ref 30.0–100.0)

## 2018-12-31 LAB — HEPATIC FUNCTION PANEL
ALT: 14 IU/L (ref 0–32)
AST: 15 IU/L (ref 0–40)
Albumin: 3.7 g/dL (ref 3.7–4.7)
Alkaline Phosphatase: 119 IU/L — ABNORMAL HIGH (ref 39–117)
Bilirubin Total: 0.2 mg/dL (ref 0.0–1.2)
Bilirubin, Direct: 0.11 mg/dL (ref 0.00–0.40)
Total Protein: 5.9 g/dL — ABNORMAL LOW (ref 6.0–8.5)

## 2019-01-06 ENCOUNTER — Inpatient Hospital Stay (HOSPITAL_COMMUNITY): Payer: Medicare Other

## 2019-01-06 ENCOUNTER — Inpatient Hospital Stay (HOSPITAL_COMMUNITY)
Admission: EM | Admit: 2019-01-06 | Discharge: 2019-01-12 | DRG: 481 | Disposition: A | Discharge status: Skilled Nursing Facility | Payer: Medicare Other | Attending: Internal Medicine | Admitting: Internal Medicine

## 2019-01-06 ENCOUNTER — Encounter (HOSPITAL_COMMUNITY): Payer: Self-pay

## 2019-01-06 ENCOUNTER — Emergency Department (HOSPITAL_COMMUNITY): Payer: Medicare Other

## 2019-01-06 ENCOUNTER — Other Ambulatory Visit: Payer: Self-pay

## 2019-01-06 DIAGNOSIS — I4581 Long QT syndrome: Secondary | ICD-10-CM | POA: Diagnosis not present

## 2019-01-06 DIAGNOSIS — Z881 Allergy status to other antibiotic agents status: Secondary | ICD-10-CM

## 2019-01-06 DIAGNOSIS — R296 Repeated falls: Secondary | ICD-10-CM | POA: Diagnosis present

## 2019-01-06 DIAGNOSIS — K219 Gastro-esophageal reflux disease without esophagitis: Secondary | ICD-10-CM | POA: Diagnosis present

## 2019-01-06 DIAGNOSIS — M47899 Other spondylosis, site unspecified: Secondary | ICD-10-CM | POA: Diagnosis not present

## 2019-01-06 DIAGNOSIS — J8410 Pulmonary fibrosis, unspecified: Secondary | ICD-10-CM | POA: Diagnosis present

## 2019-01-06 DIAGNOSIS — F329 Major depressive disorder, single episode, unspecified: Secondary | ICD-10-CM | POA: Diagnosis present

## 2019-01-06 DIAGNOSIS — Z20828 Contact with and (suspected) exposure to other viral communicable diseases: Secondary | ICD-10-CM | POA: Diagnosis not present

## 2019-01-06 DIAGNOSIS — M810 Age-related osteoporosis without current pathological fracture: Secondary | ICD-10-CM | POA: Diagnosis present

## 2019-01-06 DIAGNOSIS — G4700 Insomnia, unspecified: Secondary | ICD-10-CM | POA: Diagnosis not present

## 2019-01-06 DIAGNOSIS — E785 Hyperlipidemia, unspecified: Secondary | ICD-10-CM | POA: Diagnosis not present

## 2019-01-06 DIAGNOSIS — M25552 Pain in left hip: Secondary | ICD-10-CM | POA: Diagnosis not present

## 2019-01-06 DIAGNOSIS — G8929 Other chronic pain: Secondary | ICD-10-CM | POA: Diagnosis present

## 2019-01-06 DIAGNOSIS — R3915 Urgency of urination: Secondary | ICD-10-CM | POA: Diagnosis not present

## 2019-01-06 DIAGNOSIS — M6281 Muscle weakness (generalized): Secondary | ICD-10-CM | POA: Diagnosis not present

## 2019-01-06 DIAGNOSIS — F17200 Nicotine dependence, unspecified, uncomplicated: Secondary | ICD-10-CM | POA: Diagnosis not present

## 2019-01-06 DIAGNOSIS — Z8249 Family history of ischemic heart disease and other diseases of the circulatory system: Secondary | ICD-10-CM

## 2019-01-06 DIAGNOSIS — G43909 Migraine, unspecified, not intractable, without status migrainosus: Secondary | ICD-10-CM | POA: Diagnosis not present

## 2019-01-06 DIAGNOSIS — Z4789 Encounter for other orthopedic aftercare: Secondary | ICD-10-CM | POA: Diagnosis not present

## 2019-01-06 DIAGNOSIS — Z79899 Other long term (current) drug therapy: Secondary | ICD-10-CM | POA: Diagnosis not present

## 2019-01-06 DIAGNOSIS — I959 Hypotension, unspecified: Secondary | ICD-10-CM | POA: Diagnosis not present

## 2019-01-06 DIAGNOSIS — T148XXA Other injury of unspecified body region, initial encounter: Secondary | ICD-10-CM

## 2019-01-06 DIAGNOSIS — F419 Anxiety disorder, unspecified: Secondary | ICD-10-CM | POA: Diagnosis present

## 2019-01-06 DIAGNOSIS — J449 Chronic obstructive pulmonary disease, unspecified: Secondary | ICD-10-CM | POA: Diagnosis present

## 2019-01-06 DIAGNOSIS — R531 Weakness: Secondary | ICD-10-CM

## 2019-01-06 DIAGNOSIS — R079 Chest pain, unspecified: Secondary | ICD-10-CM | POA: Diagnosis not present

## 2019-01-06 DIAGNOSIS — K559 Vascular disorder of intestine, unspecified: Secondary | ICD-10-CM | POA: Diagnosis not present

## 2019-01-06 DIAGNOSIS — D62 Acute posthemorrhagic anemia: Secondary | ICD-10-CM | POA: Diagnosis not present

## 2019-01-06 DIAGNOSIS — F411 Generalized anxiety disorder: Secondary | ICD-10-CM | POA: Diagnosis not present

## 2019-01-06 DIAGNOSIS — Z8379 Family history of other diseases of the digestive system: Secondary | ICD-10-CM

## 2019-01-06 DIAGNOSIS — E876 Hypokalemia: Secondary | ICD-10-CM

## 2019-01-06 DIAGNOSIS — Z9981 Dependence on supplemental oxygen: Secondary | ICD-10-CM

## 2019-01-06 DIAGNOSIS — Y92008 Other place in unspecified non-institutional (private) residence as the place of occurrence of the external cause: Secondary | ICD-10-CM | POA: Diagnosis not present

## 2019-01-06 DIAGNOSIS — Z87311 Personal history of (healed) other pathological fracture: Secondary | ICD-10-CM

## 2019-01-06 DIAGNOSIS — S72002A Fracture of unspecified part of neck of left femur, initial encounter for closed fracture: Secondary | ICD-10-CM

## 2019-01-06 DIAGNOSIS — S72142S Displaced intertrochanteric fracture of left femur, sequela: Secondary | ICD-10-CM | POA: Diagnosis present

## 2019-01-06 DIAGNOSIS — K5900 Constipation, unspecified: Secondary | ICD-10-CM | POA: Diagnosis present

## 2019-01-06 DIAGNOSIS — R0902 Hypoxemia: Secondary | ICD-10-CM | POA: Diagnosis not present

## 2019-01-06 DIAGNOSIS — R262 Difficulty in walking, not elsewhere classified: Secondary | ICD-10-CM | POA: Diagnosis not present

## 2019-01-06 DIAGNOSIS — W108XXA Fall (on) (from) other stairs and steps, initial encounter: Secondary | ICD-10-CM | POA: Diagnosis present

## 2019-01-06 DIAGNOSIS — Z83518 Family history of other specified eye disorder: Secondary | ICD-10-CM

## 2019-01-06 DIAGNOSIS — S72142A Displaced intertrochanteric fracture of left femur, initial encounter for closed fracture: Secondary | ICD-10-CM | POA: Diagnosis present

## 2019-01-06 DIAGNOSIS — K5790 Diverticulosis of intestine, part unspecified, without perforation or abscess without bleeding: Secondary | ICD-10-CM | POA: Diagnosis not present

## 2019-01-06 DIAGNOSIS — R52 Pain, unspecified: Secondary | ICD-10-CM | POA: Diagnosis not present

## 2019-01-06 DIAGNOSIS — Z885 Allergy status to narcotic agent status: Secondary | ICD-10-CM

## 2019-01-06 DIAGNOSIS — S72142D Displaced intertrochanteric fracture of left femur, subsequent encounter for closed fracture with routine healing: Secondary | ICD-10-CM | POA: Diagnosis not present

## 2019-01-06 DIAGNOSIS — S299XXA Unspecified injury of thorax, initial encounter: Secondary | ICD-10-CM | POA: Diagnosis not present

## 2019-01-06 DIAGNOSIS — W19XXXA Unspecified fall, initial encounter: Secondary | ICD-10-CM

## 2019-01-06 DIAGNOSIS — Z888 Allergy status to other drugs, medicaments and biological substances status: Secondary | ICD-10-CM

## 2019-01-06 DIAGNOSIS — F17210 Nicotine dependence, cigarettes, uncomplicated: Secondary | ICD-10-CM | POA: Diagnosis present

## 2019-01-06 DIAGNOSIS — Z9181 History of falling: Secondary | ICD-10-CM | POA: Diagnosis not present

## 2019-01-06 DIAGNOSIS — S0990XA Unspecified injury of head, initial encounter: Secondary | ICD-10-CM | POA: Diagnosis not present

## 2019-01-06 DIAGNOSIS — R200 Anesthesia of skin: Secondary | ICD-10-CM | POA: Diagnosis not present

## 2019-01-06 DIAGNOSIS — R350 Frequency of micturition: Secondary | ICD-10-CM | POA: Diagnosis not present

## 2019-01-06 DIAGNOSIS — M549 Dorsalgia, unspecified: Secondary | ICD-10-CM | POA: Diagnosis not present

## 2019-01-06 DIAGNOSIS — S72141D Displaced intertrochanteric fracture of right femur, subsequent encounter for closed fracture with routine healing: Secondary | ICD-10-CM | POA: Diagnosis not present

## 2019-01-06 DIAGNOSIS — Z886 Allergy status to analgesic agent status: Secondary | ICD-10-CM

## 2019-01-06 DIAGNOSIS — R351 Nocturia: Secondary | ICD-10-CM | POA: Diagnosis not present

## 2019-01-06 DIAGNOSIS — Z741 Need for assistance with personal care: Secondary | ICD-10-CM | POA: Diagnosis not present

## 2019-01-06 LAB — COMPREHENSIVE METABOLIC PANEL
ALT: 20 U/L (ref 0–44)
AST: 23 U/L (ref 15–41)
Albumin: 3.6 g/dL (ref 3.5–5.0)
Alkaline Phosphatase: 90 U/L (ref 38–126)
Anion gap: 10 (ref 5–15)
BUN: 8 mg/dL (ref 8–23)
CHLORIDE: 104 mmol/L (ref 98–111)
CO2: 29 mmol/L (ref 22–32)
Calcium: 8.6 mg/dL — ABNORMAL LOW (ref 8.9–10.3)
Creatinine, Ser: 0.66 mg/dL (ref 0.44–1.00)
GFR calc Af Amer: >60 mL/min (ref >60)
GFR calc non Af Amer: >60 mL/min (ref >60)
Glucose, Bld: 152 mg/dL — ABNORMAL HIGH (ref 70–99)
Potassium: 2.9 mmol/L — ABNORMAL LOW (ref 3.5–5.1)
Sodium: 143 mmol/L (ref 135–145)
Total Bilirubin: 0.4 mg/dL (ref 0.3–1.2)
Total Protein: 6.3 g/dL — ABNORMAL LOW (ref 6.5–8.1)

## 2019-01-06 LAB — CBC WITH DIFFERENTIAL/PLATELET
Abs Immature Granulocytes: 0.1 10*3/uL — ABNORMAL HIGH (ref 0.00–0.07)
BASOS ABS: 0.1 10*3/uL (ref 0.0–0.1)
Basophils Relative: 1 %
Eosinophils Absolute: 1 10*3/uL — ABNORMAL HIGH (ref 0.0–0.5)
Eosinophils Relative: 8 %
HCT: 37.9 % (ref 36.0–46.0)
Hemoglobin: 11.2 g/dL — ABNORMAL LOW (ref 12.0–15.0)
IMMATURE GRANULOCYTES: 1 %
Lymphocytes Relative: 9 %
Lymphs Abs: 1 10*3/uL (ref 0.7–4.0)
MCH: 28.4 pg (ref 26.0–34.0)
MCHC: 29.6 g/dL — ABNORMAL LOW (ref 30.0–36.0)
MCV: 95.9 fL (ref 80.0–100.0)
Monocytes Absolute: 0.5 10*3/uL (ref 0.1–1.0)
Monocytes Relative: 4 %
NEUTROS ABS: 9 10*3/uL — AB (ref 1.7–7.7)
NEUTROS PCT: 77 %
Platelets: 171 10*3/uL (ref 150–400)
RBC: 3.95 MIL/uL (ref 3.87–5.11)
RDW: 16.5 % — ABNORMAL HIGH (ref 11.5–15.5)
WBC: 11.6 10*3/uL — ABNORMAL HIGH (ref 4.0–10.5)
nRBC: 0 % (ref 0.0–0.2)

## 2019-01-06 LAB — PREPARE RBC (CROSSMATCH)

## 2019-01-06 LAB — MAGNESIUM: Magnesium: 1.7 mg/dL (ref 1.7–2.4)

## 2019-01-06 MED ORDER — SODIUM CHLORIDE 0.9% IV SOLUTION: Freq: Once | INTRAVENOUS | Status: DC

## 2019-01-06 MED ORDER — ACETAMINOPHEN 325 MG PO TABS
650.0000 mg | ORAL_TABLET | Freq: Four times a day (QID) | ORAL | Status: DC | PRN
  Administered 2019-01-09 – 2019-01-10 (×2): 650 mg via ORAL
  Filled 2019-01-06 (×2): qty 2

## 2019-01-06 MED ORDER — HYDROCODONE-ACETAMINOPHEN 10-325 MG PO TABS: 1.0000 | ORAL_TABLET | Freq: Four times a day (QID) | ORAL | Status: DC | PRN

## 2019-01-06 MED ORDER — ALPRAZOLAM 0.5 MG PO TABS
0.5000 mg | ORAL_TABLET | Freq: Three times a day (TID) | ORAL | Status: DC | PRN
  Administered 2019-01-06 – 2019-01-12 (×6): 0.5 mg via ORAL
  Filled 2019-01-06 (×6): qty 1

## 2019-01-06 MED ORDER — HYDROMORPHONE HCL 2 MG/ML IJ SOLN
2.0000 mg | Freq: Four times a day (QID) | INTRAMUSCULAR | Status: DC | PRN
  Administered 2019-01-07 (×2): 2 mg via INTRAVENOUS
  Filled 2019-01-06 (×2): qty 1

## 2019-01-06 MED ORDER — ONDANSETRON HCL 4 MG/2ML IJ SOLN: 4.0000 mg | Freq: Four times a day (QID) | INTRAMUSCULAR | Status: DC | PRN

## 2019-01-06 MED ORDER — ONDANSETRON HCL 4 MG PO TABS
4.0000 mg | ORAL_TABLET | Freq: Four times a day (QID) | ORAL | Status: DC | PRN
  Administered 2019-01-06: 4 mg via ORAL
  Filled 2019-01-06: qty 1

## 2019-01-06 MED ORDER — PANTOPRAZOLE SODIUM 40 MG PO TBEC
40.0000 mg | DELAYED_RELEASE_TABLET | Freq: Two times a day (BID) | ORAL | Status: DC
  Administered 2019-01-07 – 2019-01-12 (×10): 40 mg via ORAL
  Filled 2019-01-06 (×10): qty 1

## 2019-01-06 MED ORDER — ONDANSETRON HCL 4 MG/2ML IJ SOLN
4.0000 mg | Freq: Four times a day (QID) | INTRAMUSCULAR | Status: DC | PRN
  Administered 2019-01-07 – 2019-01-08 (×4): 4 mg via INTRAVENOUS
  Filled 2019-01-06 (×4): qty 2

## 2019-01-06 MED ORDER — ALBUTEROL SULFATE HFA 108 (90 BASE) MCG/ACT IN AERS
2.0000 | INHALATION_SPRAY | Freq: Four times a day (QID) | RESPIRATORY_TRACT | Status: DC | PRN
  Filled 2019-01-06: qty 6.7

## 2019-01-06 MED ORDER — KCL IN DEXTROSE-NACL 40-5-0.9 MEQ/L-%-% IV SOLN
INTRAVENOUS | Status: DC
  Administered 2019-01-07: via INTRAVENOUS
  Filled 2019-01-06 (×4): qty 1000

## 2019-01-06 MED ORDER — ONDANSETRON HCL 4 MG/2ML IJ SOLN
4.0000 mg | Freq: Once | INTRAMUSCULAR | Status: AC
  Administered 2019-01-06: 4 mg via INTRAVENOUS
  Filled 2019-01-06: qty 2

## 2019-01-06 MED ORDER — HYDROMORPHONE HCL 1 MG/ML IJ SOLN: 0.5000 mg | INTRAMUSCULAR | Status: DC | PRN

## 2019-01-06 MED ORDER — SODIUM CHLORIDE 0.9 % IV SOLN
INTRAVENOUS | Status: DC
  Administered 2019-01-06: 20:00:00 via INTRAVENOUS

## 2019-01-06 MED ORDER — POLYETHYLENE GLYCOL 3350 17 G PO PACK: 17.0000 g | PACK | Freq: Every day | ORAL | Status: DC | PRN

## 2019-01-06 MED ORDER — POTASSIUM CHLORIDE CRYS ER 20 MEQ PO TBCR
40.0000 meq | EXTENDED_RELEASE_TABLET | Freq: Once | ORAL | Status: AC
  Administered 2019-01-06: 40 meq via ORAL
  Filled 2019-01-06: qty 2

## 2019-01-06 MED ORDER — ACETAMINOPHEN 650 MG RE SUPP: 650.0000 mg | Freq: Four times a day (QID) | RECTAL | Status: DC | PRN

## 2019-01-06 MED ORDER — SODIUM CHLORIDE 0.9 % IV BOLUS
500.0000 mL | Freq: Once | INTRAVENOUS | Status: AC
  Administered 2019-01-06: 500 mL via INTRAVENOUS

## 2019-01-06 MED ORDER — ESCITALOPRAM OXALATE 10 MG PO TABS
20.0000 mg | ORAL_TABLET | Freq: Every day | ORAL | Status: DC
  Administered 2019-01-06 – 2019-01-11 (×6): 20 mg via ORAL
  Filled 2019-01-06 (×6): qty 2

## 2019-01-06 MED ORDER — HYDROMORPHONE HCL 1 MG/ML IJ SOLN
1.0000 mg | Freq: Once | INTRAMUSCULAR | Status: AC
  Administered 2019-01-06: 1 mg via INTRAVENOUS
  Filled 2019-01-06: qty 1

## 2019-01-06 NOTE — H&P (Addendum)
History and Physical    Misty [name] GNF:[id number] DOB: [birth year] DOA: 01/06/2019  PCP: Chipper Herb, MD   Patient coming from: Home  I have personally briefly reviewed patient's old medical records in [place]  Chief Complaint: Fall  HPI: Misty [name] is a 78 y.o. female with medical history significant for pulmonary fibrosis, COPD- NOT on home O2., ischemic colitis, who presented to [facility] ED after a fall, with complaints of left hip pain. Patient fell while trying to go down 3 steps outside their house to the driveway.  Patient had back surgery about a month ago, and usually her husband assists her when going down the stairs, he was not present to assist this time.  Patient denies dizziness, no chest pain no difficulty breathing.  Patient reports when she fell she hit the back of her head against the driveway.  Enroute to ED patient was given IV morphine for pain, and had about 5 episodes of projectile vomiting.  Patient's believes vomiting was a combination of severe pain and being in the ambulance.  She denies abdominal pain, vomiting or loose stools.   ED Course: O2 sats 85% on room air, improved with nasal cannula.  Hypokalemia 2.9.  Pelvic x-ray shows comminuted left femur intertrochanteric fracture with anterior displacement and mild valgus impaction.  Portable chest x-ray negative for acute abnormality.  EKG-sinus rhythm, QTc prolonged 539 otherwise no change from prior.  Patient given 40 KCL. EDP talked to orthopedist on call Dr. Aline Brochure who was okay with admission here, will see in a.m. plans for surgery tomorrow.   Review of Systems: As per HPI all other systems reviewed and negative.  Past Medical History:  Diagnosis Date  . Anxiety    takes Xanax daily  . Arthritis    back  . Cataract   . Chronic back pain   . Constipation    OTC stool softener prn  . COPD (chronic obstructive pulmonary disease) ([place])   . Depression   . Diverticulosis   . GERD  (gastroesophageal reflux disease)    takes Ranidine daily  . Hemorrhoids   . History of bronchitis   . History of migraine    many yrs ago  . History of shingles   . Hyperlipidemia   . Insomnia   . Migraines   . Nocturia   . Numbness    left leg  . Osteoporosis   . PONV (postoperative nausea and vomiting)   . Urinary frequency   . Urinary urgency     Past Surgical History:  Procedure Laterality Date  . ABDOMINAL HYSTERECTOMY    . COLONOSCOPY    . COLONOSCOPY N/A 02/17/2015   Procedure: COLONOSCOPY;  Surgeon: Rogene Houston, MD;  Location: [facility];  Service: Endoscopy;  Laterality: N/A;  110n - moved to 11:15 - Ann to notify pt  . ESOPHAGOGASTRODUODENOSCOPY    . ESOPHAGOGASTRODUODENOSCOPY N/A 01/29/2016   Procedure: ESOPHAGOGASTRODUODENOSCOPY (EGD);  Surgeon: Rogene Houston, MD;  Location: [facility];  Service: Endoscopy;  Laterality: N/A;  . ESOPHAGOGASTRODUODENOSCOPY N/A 10/14/2017   Procedure: ESOPHAGOGASTRODUODENOSCOPY (EGD);  Surgeon: Rogene Houston, MD;  Location: [facility];  Service: Endoscopy;  Laterality: N/A;  730  . EYE SURGERY Right 3/15   cataracts  . KYPHOPLASTY N/A 07/08/2014   Procedure: Thoracic Eleven Kyphoplasty;  Surgeon: Hosie Spangle, MD;  Location: [place] [facility];  Service: Neurosurgery;  Laterality: N/A;  . LUMBAR LAMINECTOMY/DECOMPRESSION MICRODISCECTOMY Bilateral 05/20/2013   Procedure: LUMBAR  LAMINECTOMY/DECOMPRESSION MICRODISCECTOMY 1 LEVEL;  Surgeon: Hosie Spangle, MD;  Location: [place] [facility];  Service: Neurosurgery;  Laterality: Bilateral;  Bilateral Lumbar four-five laminotomy and left lumbar four-five microdiskectomy  . SPINE SURGERY     Dr Carloyn Manner -  vertebraplasty     reports that she has been smoking cigarettes. She started smoking about 60 years ago. She has a 52.00 pack-year smoking history. She has never used smokeless tobacco. She reports that she does not drink alcohol or use drugs.  Allergies  Allergen Reactions  .  Codeine Nausea And Vomiting  . Tramadol Nausea And Vomiting  . Asa [Aspirin] Other (See Comments)    Patient is unaware of allergy  . Azithromycin Other (See Comments)    Patient is unaware of allergy  . Celebrex [Celecoxib] Other (See Comments)    Irritated stomach   . Cymbalta [Duloxetine Hcl] Other (See Comments)    Felt groggy  . Prednisone Rash    She has seen the podiatrist and he had injected cortisone in her feet and she had also taken a peel at home. She had a severe reaction to her face with a rash and had to take Benadryl to resolve the symptom. She actually did get more cortisone shots, but no additional reactions to the shots.  . Vioxx [Rofecoxib] Other (See Comments)    Unknown  . Zelnorm [Tegaserod] Other (See Comments)    Patient is unaware of allergy  . Zocor [Simvastatin] Other (See Comments)    Patient is unaware of allergy    Family History  Problem Relation Age of Onset  . Hypertension Mother   . Macular degeneration Father   . Heart disease Father   . Cirrhosis Son   . Heart disease Son   . Colon cancer Neg Hx     Prior to Admission medications   Medication Sig Start Date End Date Taking? Authorizing Provider  acetaminophen (TYLENOL) 500 MG tablet Take 500 mg by mouth every 6 (six) hours as needed for moderate pain or headache.    Yes [provider]  albuterol (PROVENTIL HFA;VENTOLIN HFA) 108 (90 Base) MCG/ACT inhaler Inhale 2 puffs into the lungs every 6 (six) hours as needed for wheezing or shortness of breath (cough). 12/30/18  Yes Chipper Herb, MD  ALPRAZolam Duanne Moron) 0.5 MG tablet Take 1 tablet (0.5 mg total) by mouth 3 (three) times daily as needed for anxiety. 12/30/18  Yes Chipper Herb, MD  cholecalciferol (VITAMIN D) 1000 UNITS tablet Take 2,000-4,000 Units by mouth daily. Take 2000 units daily except take 4000 units on Saturday and Sunday.   Yes [provider]  escitalopram (LEXAPRO) 20 MG tablet TAKE ONE TABLET AT  BEDTIME Patient taking differently: Take 20 mg by mouth at bedtime.  11/16/18  Yes Rehman, Mechele Dawley, MD  FeFum-FePoly-FA-B Cmp-C-Biot (INTEGRA PLUS) CAPS Take 1 capsule by mouth daily. 06/27/18  Yes Rehman, Mechele Dawley, MD  HYDROcodone-acetaminophen (NORCO) 10-325 MG tablet Take 1 tablet by mouth every 6 (six) hours as needed for moderate pain or severe pain.  01/05/19  Yes [provider]  Multiple Vitamin (MULTIVITAMIN WITH MINERALS) TABS tablet Take 1 tablet by mouth daily. Centrum Silver   Yes [provider]  nystatin (MYCOSTATIN) 100000 UNIT/ML suspension Take 5 mLs (500,000 Units total) by mouth 4 (four) times daily. Swish and spit 12/30/18  Yes Chipper Herb, MD  pantoprazole (PROTONIX) 40 MG tablet Take 1 tablet (40 mg total) by mouth 2 (two) times daily before  a meal. 01/27/18  Yes Rehman, Mechele Dawley, MD  vitamin B-12 (CYANOCOBALAMIN) 1000 MCG tablet Take 1,000 mcg by mouth daily.   Yes [provider]    Physical Exam: Vitals:   01/06/19 1930 01/06/19 2000 01/06/19 2030 01/06/19 2100  BP: (!) 150/87 (!) 156/92 (!) 159/76 (!) 156/78  Pulse: 90 83 87 89  Resp: 20 (!) 25 19 17   Temp:      TempSrc:      SpO2: 94% 96% 95% 97%  Weight:      Height:        Constitutional: NAD, calm, comfortable Vitals:   01/06/19 1930 01/06/19 2000 01/06/19 2030 01/06/19 2100  BP: (!) 150/87 (!) 156/92 (!) 159/76 (!) 156/78  Pulse: 90 83 87 89  Resp: 20 (!) 25 19 17   Temp:      TempSrc:      SpO2: 94% 96% 95% 97%  Weight:      Height:       Eyes: PERRL, lids and conjunctivae normal ENMT: Mucous membranes are moist. Posterior pharynx clear of any exudate or lesions.  Neck: normal, supple, no masses, no thyromegaly Respiratory: clear to auscultation bilaterally, no wheezing, no crackles. Normal respiratory effort. No accessory muscle use.  Cardiovascular: Regular rate and rhythm, no murmurs / rubs / gallops. No extremity edema. 2+ pedal pulses.   Abdomen: Full, no  tenderness, no masses palpated. No hepatosplenomegaly. Bowel sounds positive.  Musculoskeletal: no clubbing / cyanosis.  Good ROM, no contractures. Normal muscle tone. LLE not tested, shorter than right. Skin: no rashes, lesions, ulcers. No induration Neurologic: CN 2-12 grossly intact. Sensation intact, DTR normal. Strength 5/5 in all except left lower extremity- not tested.  Psychiatric: Normal judgment and insight. Alert and oriented x 3. Normal mood.   Labs on Admission: I have personally reviewed following labs and imaging studies  CBC: Recent Labs  Lab 01/06/19 1700  WBC 11.6*  NEUTROABS 9.0*  HGB 11.2*  HCT 37.9  MCV 95.9  PLT 322   Basic Metabolic Panel: Recent Labs  Lab 01/06/19 1700  NA 143  K 2.9*  CL 104  CO2 29  GLUCOSE 152*  BUN 8  CREATININE 0.66  CALCIUM 8.6*  MG 1.7   GFR: Estimated Creatinine Clearance: 54.9 mL/min (by C-G formula based on SCr of 0.66 mg/dL). Liver Function Tests: Recent Labs  Lab 01/06/19 1700  AST 23  ALT 20  ALKPHOS 90  BILITOT 0.4  PROT 6.3*  ALBUMIN 3.6    Radiological Exams on Admission: Dg Chest 1 View  Result Date: 01/06/2019 CLINICAL DATA:  78 year old female status post fall down steps. Pain. EXAM: CHEST  1 VIEW COMPARISON:  Chest radiographs 12/08/2018 and earlier. FINDINGS: AP supine view at 1903 hours. Multilevel previous spinal compression fracture vertebral augmentation. Stable lung volumes and mediastinal contours. Calcified aortic atherosclerosis. Visualized tracheal air column is within normal limits. Allowing for portable technique the lungs are clear. No pneumothorax or pleural effusion. No acute osseous abnormality identified. IMPRESSION: No acute cardiopulmonary abnormality or acute traumatic injury identified. Electronically Signed   By: Genevie Ann M.D.   On: 01/06/2019 19:35   Dg Hip Unilat W Or Wo Pelvis 2-3 Views Left  Result Date: 01/06/2019 CLINICAL DATA:  78 year old female status post fall down steps.  Pain. EXAM: DG HIP (WITH OR WITHOUT PELVIS) 2-3V LEFT COMPARISON:  [facility] Lumbar radiographs 04/16/2018. FINDINGS: Comminuted proximal left femur intertrochanteric fracture with anterior displacement of 1/2 shaft with, mild varus impaction,  mildly displaced trochanter butterfly fragments. Left femoral head remains normally located. No superimposed acute fracture of the pelvis is identified. Grossly intact proximal right femur. Aortoiliac calcified atherosclerosis. Nonobstructed visible bowel gas pattern. IMPRESSION: Comminuted left femur intertrochanteric fracture with anterior displacement and mild varus impaction. Electronically Signed   By: Genevie Ann M.D.   On: 01/06/2019 19:37    EKG: Independently reviewed.  Sinus rhythm.  QTc 539.  Assessment/Plan Active Problems:   Displaced intertrochanteric fracture of left femur, initial encounter for closed fracture (HCC)   Left femoral fracture-sustained from mechanical fall. X-ray shows comminuted left femur intertrochanteric fracture with anterior displacement and mild valgus impaction. No cardiac hx. Pulmonary disease stable prior to today. Dr. Aline Brochure consulted plans for surgery tomorrow -N.p.o. midnight -IV Dilaudid for pain-1 mg of Dilaudid did not help pain, increase to 2 mg Q6H PRN - Hydrate while NPO D5Ns + 40KCL 75cc/hr x 15 hrs -Reassess O2 requirements in a.m.  Mechanical fall-reports she hit the back of her head against the driveway -CT HEAD stat -PT evaluation when able  Hypokalemia- K - 2.9.  Likely from vomiting en route.  -Check magnesium- 1.7 -Replete potassium -BMP a.m.  COPD, pulmonary fibrosis-on home O2.  O2 sats 85% on arrival, ? Hypoventilation from pain.  Portable chest x-ray negative for acute abnormality.  No respiratory symptoms. -Duo nebs PRN\  Depression/anxiety- - continue home Xanax Lexapro  DVT prophylaxis: SCds Code Status: Full Family Communication: Spouse at bedside Disposition Plan: per  rounding team Consults called: Orthopedics Admission status: Inpt, Med-surg   Bethena Roys MD [facility]  01/06/2019, 10:45 PM

## 2019-01-06 NOTE — ED Triage Notes (Signed)
Pt missed a step and fell down several brick steps and complaining of left hip pain. EMS encoded and was given order for 4 mg morphine adm 1545 and started complaining of Nausea and c/o being hot adm zofran 4mg . Pt had 5 episodes of projectile vomiting enroute.

## 2019-01-06 NOTE — ED Notes (Signed)
Pt with left hip pain and unable to lay flat. . No bruising noted. Unable to extend  Legs due to pain

## 2019-01-06 NOTE — ED Provider Notes (Signed)
[facility] EMERGENCY DEPARTMENT Provider Note   CSN: [id number] Arrival date & time: 01/06/19  1624    History   Chief Complaint Chief Complaint  Patient presents with  . Hip Pain    HPI Misty [name] is a 78 y.o. female.     Patient status post fall and she fell down some stairs several brick steps injuring her left hip she has pain in that area.  Very painful to move the left leg.  Did not hit her head had no loss of consciousness.  Fall occurred shortly prior to arrival.  EMS administered 4 mg of morphine which resulted in a lot of nausea.  So patient was given Zofran.  Patient had about 5 episodes of vomiting in route.  Patient's past medical history significant for COPD and chronic back pain.  Has had multiple back surgeries by Dr. Carloyn Manner a neurosurgeon up in the [facility] area.  No sniffing complaint of any severe back pain or any new injuries to that area today.  No loss of consciousness.  Patient's back pain is baseline.  Patient is not on home oxygen for her COPD.     Past Medical History:  Diagnosis Date  . Anxiety    takes Xanax daily  . Arthritis    back  . Cataract   . Chronic back pain   . Constipation    OTC stool softener prn  . COPD (chronic obstructive pulmonary disease) ([place])   . Depression   . Diverticulosis   . GERD (gastroesophageal reflux disease)    takes Ranidine daily  . Hemorrhoids   . History of bronchitis   . History of migraine    many yrs ago  . History of shingles   . Hyperlipidemia   . Insomnia   . Migraines   . Nocturia   . Numbness    left leg  . Osteoporosis   . PONV (postoperative nausea and vomiting)   . Urinary frequency   . Urinary urgency     Patient Active Problem List   Diagnosis Date Noted  . Pulmonary fibrosis ([place]) 04/07/2018  . Non-intractable vomiting with nausea 10/06/2017  . Malnutrition of moderate degree 01/29/2016  . Symptomatic anemia 01/27/2016  . Generalized weakness 01/27/2016  .  Abnormal chest x-ray 01/27/2016  . Tobacco use disorder 01/27/2016  . Colitis   . Hematochezia 02/26/2015  . Ischemic colitis ([place]) 02/26/2015  . Orthostatic hypotension 02/26/2015  . Non-traumatic compression fracture of T11 thoracic vertebra 07/08/2014  . Hyperlipidemia 08/19/2013  . Fibrocystic breast disease 08/19/2013  . History of migraine headaches 08/19/2013  . GERD (gastroesophageal reflux disease) 04/07/2013  . Osteoporosis, postmenopausal 04/07/2013    Past Surgical History:  Procedure Laterality Date  . ABDOMINAL HYSTERECTOMY    . COLONOSCOPY    . COLONOSCOPY N/A 02/17/2015   Procedure: COLONOSCOPY;  Surgeon: Rogene Houston, MD;  Location: [facility] ENDO [facility];  Service: Endoscopy;  Laterality: N/A;  110n - moved to 11:15 - Ann to notify pt  . ESOPHAGOGASTRODUODENOSCOPY    . ESOPHAGOGASTRODUODENOSCOPY N/A 01/29/2016   Procedure: ESOPHAGOGASTRODUODENOSCOPY (EGD);  Surgeon: Rogene Houston, MD;  Location: [facility] END[facility];  Service: Endoscopy;  Laterality: N/A;  . ESOPHAGOGASTRODUODENOSCOPY N/A 10/14/2017   Procedure: ESOPHAGOGASTRODUODENOSCOPY (EGD);  Surgeon: Rogene Houston, MD;  Location: [facility];  Service: Endoscopy;  Laterality: N/A;  730  . EYE SURGERY Right 3/15   cataracts  . KYPHOPLASTY N/A 07/08/2014   Procedure: Thoracic Eleven Kyphoplasty;  Surgeon:  Hosie Spangle, MD;  Location: [place] NEURO ORS;  Service: Neurosurgery;  Laterality: N/A;  . LUMBAR LAMINECTOMY/DECOMPRESSION MICRODISCECTOMY Bilateral 05/20/2013   Procedure: LUMBAR LAMINECTOMY/DECOMPRESSION MICRODISCECTOMY 1 LEVEL;  Surgeon: Hosie Spangle, MD;  Location: [place] NEURO ORS;  Service: Neurosurgery;  Laterality: Bilateral;  Bilateral Lumbar four-five laminotomy and left lumbar four-five microdiskectomy  . SPINE SURGERY     Dr Carloyn Manner -  vertebraplasty     OB History   No obstetric history on file.      Home Medications    Prior to Admission medications   Medication Sig Start Date End Date Taking?  Authorizing Provider  acetaminophen (TYLENOL) 500 MG tablet Take 500 mg by mouth every 6 (six) hours as needed for moderate pain or headache.     [provider]  albuterol (PROVENTIL HFA;VENTOLIN HFA) 108 (90 Base) MCG/ACT inhaler Inhale 2 puffs into the lungs every 6 (six) hours as needed for wheezing or shortness of breath (cough). 12/30/18   Chipper Herb, MD  ALPRAZolam Duanne Moron) 0.5 MG tablet Take 1 tablet (0.5 mg total) by mouth 3 (three) times daily as needed for anxiety. 12/30/18   Chipper Herb, MD  cholecalciferol (VITAMIN D) 1000 UNITS tablet Take 2,000-4,000 Units by mouth daily. Take 2000 units daily except take 4000 units on Saturday and Sunday.    [provider]  escitalopram (LEXAPRO) 20 MG tablet TAKE ONE TABLET AT BEDTIME 11/16/18   Rehman, Mechele Dawley, MD  FeFum-FePoly-FA-B Cmp-C-Biot (INTEGRA PLUS) CAPS Take 1 capsule by mouth daily. 06/27/18   Rogene Houston, MD  loperamide (IMODIUM) 2 MG capsule Take 1 capsule (2 mg total) by mouth daily as needed for diarrhea or loose stools. Patient not taking: Reported on 12/30/2018 08/22/16   Rogene Houston, MD  Multiple Vitamin (MULTIVITAMIN WITH MINERALS) TABS tablet Take 1 tablet by mouth daily. Centrum Silver    [provider]  nystatin (MYCOSTATIN) 100000 UNIT/ML suspension Take 5 mLs (500,000 Units total) by mouth 4 (four) times daily. Swish and spit 12/30/18   Chipper Herb, MD  pantoprazole (PROTONIX) 40 MG tablet Take 1 tablet (40 mg total) by mouth 2 (two) times daily before a meal. 01/27/18   Rehman, Mechele Dawley, MD  polyethylene glycol powder (GLYCOLAX/MIRALAX) powder Take 8.5 g by mouth daily. Patient not taking: Reported on 12/30/2018 08/22/16   Rogene Houston, MD  vitamin B-12 (CYANOCOBALAMIN) 1000 MCG tablet Take 1,000 mcg by mouth daily.    [provider]    Family History Family History  Problem Relation Age of Onset  . Hypertension Mother   . Macular degeneration Father   . Heart  disease Father   . Cirrhosis Son   . Heart disease Son   . Colon cancer Neg Hx     Social History Social History   Tobacco Use  . Smoking status: Current Some Day Smoker    Packs/day: 1.00    Years: 52.00    Pack years: 52.00    Types: Cigarettes    Start date: 11/18/1958    Last attempt to quit: 08/20/2011    Years since quitting: 7.3  . Smokeless tobacco: Never Used  . Tobacco comment: 1/2-1pack off and on all her life  Substance Use Topics  . Alcohol use: No    Alcohol/week: 0.0 standard drinks  . Drug use: No     Allergies   Codeine; Tramadol; Asa [aspirin]; Azithromycin; Celebrex [celecoxib]; Cymbalta [duloxetine hcl]; Prednisone; Vioxx [rofecoxib]; Zelnorm [tegaserod]; and Zocor [  simvastatin]   Review of Systems Review of Systems  Constitutional: Negative for chills and fever.  HENT: Negative for rhinorrhea and sore throat.   Eyes: Negative for visual disturbance.  Respiratory: Negative for cough and shortness of breath.   Cardiovascular: Negative for chest pain and leg swelling.  Gastrointestinal: Negative for abdominal pain, diarrhea, nausea and vomiting.  Genitourinary: Negative for dysuria.  Musculoskeletal: Positive for back pain. Negative for neck pain.  Skin: Negative for rash.  Neurological: Negative for dizziness, syncope, light-headedness and headaches.  Hematological: Does not bruise/bleed easily.  Psychiatric/Behavioral: Negative for confusion.     Physical Exam Updated Vital Signs BP (!) 150/87   Pulse 90   Temp 98.5 F (36.9 C) (Oral)   Resp 20   Ht 1.702 m (5' 7" )   Wt 59 kg   SpO2 94%   BMI 20.36 kg/m   Physical Exam Vitals signs and nursing note reviewed.  Constitutional:      General: She is not in acute distress.    Appearance: She is well-developed.  HENT:     Head: Normocephalic and atraumatic.     Mouth/Throat:     Mouth: Mucous membranes are moist.  Eyes:     Extraocular Movements: Extraocular movements intact.      Conjunctiva/sclera: Conjunctivae normal.     Pupils: Pupils are equal, round, and reactive to light.  Neck:     Musculoskeletal: Normal range of motion and neck supple. No muscular tenderness.     Comments: No tenderness to palpation to the posterior aspect of the cervical spine. Cardiovascular:     Rate and Rhythm: Normal rate and regular rhythm.     Heart sounds: Normal heart sounds. No murmur.  Pulmonary:     Effort: Pulmonary effort is normal. No respiratory distress.     Breath sounds: Normal breath sounds. No wheezing.  Abdominal:     General: Bowel sounds are normal.     Palpations: Abdomen is soft.     Tenderness: There is no abdominal tenderness.  Musculoskeletal:        General: Tenderness and deformity present.     Comments: Right leg normal dorsalis pedis pulse 2+.  Left leg with shortening.  Dorsalis pedis pulses 2+.  Deformity noted up around the left hip area.  Skin:    General: Skin is warm and dry.     Capillary Refill: Capillary refill takes less than 2 seconds.  Neurological:     General: No focal deficit present.     Mental Status: She is alert and oriented to person, place, and time.      ED Treatments / Results  Labs (all labs ordered are listed, but only abnormal results are displayed) Labs Reviewed  CBC WITH DIFFERENTIAL/PLATELET - Abnormal; Notable for the following components:      Result Value   WBC 11.6 (*)    Hemoglobin 11.2 (*)    MCHC 29.6 (*)    RDW 16.5 (*)    Neutro Abs 9.0 (*)    Eosinophils Absolute 1.0 (*)    Abs Immature Granulocytes 0.10 (*)    All other components within normal limits  COMPREHENSIVE METABOLIC PANEL - Abnormal; Notable for the following components:   Potassium 2.9 (*)    Glucose, Bld 152 (*)    Calcium 8.6 (*)    Total Protein 6.3 (*)    All other components within normal limits    EKG EKG Interpretation  Date/Time:  Wednesday January 06 2019 19:29:53 EST  Ventricular Rate:  87 PR Interval:    QRS  Duration: 109 QT Interval:  448 QTC Calculation: 539 R Axis:   5 Text Interpretation:  Sinus rhythm Multiple premature complexes, vent & supraven RSR' in V1 or V2, probably normal variant Probable left ventricular hypertrophy Nonspecific T abnormalities, lateral leads Prolonged QT interval Baseline wander in lead(s) V6 Interpretation limited secondary to artifact Confirmed by Fredia Sorrow [phone]) on 01/06/2019 7:43:09 PM   Radiology Dg Chest 1 View  Result Date: 01/06/2019 CLINICAL DATA:  78 year old female status post fall down steps. Pain. EXAM: CHEST  1 VIEW COMPARISON:  Chest radiographs 12/08/2018 and earlier. FINDINGS: AP supine view at 1903 hours. Multilevel previous spinal compression fracture vertebral augmentation. Stable lung volumes and mediastinal contours. Calcified aortic atherosclerosis. Visualized tracheal air column is within normal limits. Allowing for portable technique the lungs are clear. No pneumothorax or pleural effusion. No acute osseous abnormality identified. IMPRESSION: No acute cardiopulmonary abnormality or acute traumatic injury identified. Electronically Signed   By: Genevie Ann M.D.   On: 01/06/2019 19:35   Dg Hip Unilat W Or Wo Pelvis 2-3 Views Left  Result Date: 01/06/2019 CLINICAL DATA:  78 year old female status post fall down steps. Pain. EXAM: DG HIP (WITH OR WITHOUT PELVIS) 2-3V LEFT COMPARISON:  [facility] Lumbar radiographs 04/16/2018. FINDINGS: Comminuted proximal left femur intertrochanteric fracture with anterior displacement of 1/2 shaft with, mild varus impaction, mildly displaced trochanter butterfly fragments. Left femoral head remains normally located. No superimposed acute fracture of the pelvis is identified. Grossly intact proximal right femur. Aortoiliac calcified atherosclerosis. Nonobstructed visible bowel gas pattern. IMPRESSION: Comminuted left femur intertrochanteric fracture with anterior displacement and mild varus impaction.  Electronically Signed   By: Genevie Ann M.D.   On: 01/06/2019 19:37    Procedures Procedures (including critical care time)  Medications Ordered in ED Medications  0.9 %  sodium chloride infusion ( Intravenous New Bag/Given 01/06/19 1937)  sodium chloride 0.9 % bolus 500 mL (0 mLs Intravenous Stopped 01/06/19 1935)  ondansetron (ZOFRAN) injection 4 mg (4 mg Intravenous Given 01/06/19 1839)  HYDROmorphone (DILAUDID) injection 1 mg (1 mg Intravenous Given 01/06/19 1839)     Initial Impression / Assessment and Plan / ED Course  I have reviewed the triage vital signs and the nursing notes.  Pertinent labs & imaging results that were available during my care of the patient were reviewed by me and considered in my medical decision making (see chart for details).        Chest x-ray without acute findings.  EKG without acute findings.  Labs without significant abnormalities other than the potassium was 2.9.  Patient given p.o. supplement potassium.  X-rays of the left hip and pelvis with a left comminuted intertrochanteric fracture.  Discussed with Dr. Aline Brochure from orthopedic surgeon he feels he can repair that here.  Hospitalist will admit the patient here to clear medically for surgery.  Patient's past medical history significant mostly for COPD.  Patient will need further correction of the potassium prior to surgery.  Final Clinical Impressions(s) / ED Diagnoses   Final diagnoses:  Closed left hip fracture, initial encounter [facility])  Fall, initial encounter    ED Discharge Orders    None       Fredia Sorrow, MD 01/06/19 2103

## 2019-01-07 ENCOUNTER — Encounter (HOSPITAL_COMMUNITY): Payer: Self-pay | Admitting: *Deleted

## 2019-01-07 ENCOUNTER — Inpatient Hospital Stay (HOSPITAL_COMMUNITY): Payer: Medicare Other | Admitting: Anesthesiology

## 2019-01-07 ENCOUNTER — Inpatient Hospital Stay (HOSPITAL_COMMUNITY): Payer: Medicare Other

## 2019-01-07 ENCOUNTER — Encounter (HOSPITAL_COMMUNITY)
Admission: EM | Disposition: A | Discharge status: Skilled Nursing Facility | Payer: Self-pay | Source: Home / Self Care | Attending: Family Medicine

## 2019-01-07 DIAGNOSIS — S72142A Displaced intertrochanteric fracture of left femur, initial encounter for closed fracture: Secondary | ICD-10-CM | POA: Diagnosis not present

## 2019-01-07 DIAGNOSIS — R296 Repeated falls: Secondary | ICD-10-CM

## 2019-01-07 HISTORY — PX: INTRAMEDULLARY (IM) NAIL INTERTROCHANTERIC: SHX5875

## 2019-01-07 LAB — CBC
HCT: 36.7 % (ref 36.0–46.0)
Hemoglobin: 10.6 g/dL — ABNORMAL LOW (ref 12.0–15.0)
MCH: 27.7 pg (ref 26.0–34.0)
MCHC: 28.9 g/dL — ABNORMAL LOW (ref 30.0–36.0)
MCV: 96.1 fL (ref 80.0–100.0)
NRBC: 0 % (ref 0.0–0.2)
Platelets: 174 10*3/uL (ref 150–400)
RBC: 3.82 MIL/uL — AB (ref 3.87–5.11)
RDW: 16.5 % — ABNORMAL HIGH (ref 11.5–15.5)
WBC: 7.9 10*3/uL (ref 4.0–10.5)

## 2019-01-07 LAB — BASIC METABOLIC PANEL
Anion gap: 7 (ref 5–15)
BUN: 7 mg/dL — ABNORMAL LOW (ref 8–23)
CALCIUM: 8.1 mg/dL — AB (ref 8.9–10.3)
CO2: 33 mmol/L — ABNORMAL HIGH (ref 22–32)
CREATININE: 0.61 mg/dL (ref 0.44–1.00)
Chloride: 102 mmol/L (ref 98–111)
GFR calc Af Amer: >60 mL/min (ref >60)
GFR calc non Af Amer: >60 mL/min (ref >60)
Glucose, Bld: 142 mg/dL — ABNORMAL HIGH (ref 70–99)
Potassium: 3.2 mmol/L — ABNORMAL LOW (ref 3.5–5.1)
Sodium: 142 mmol/L (ref 135–145)

## 2019-01-07 LAB — SURGICAL PCR SCREEN
MRSA, PCR: NEGATIVE
Staphylococcus aureus: NEGATIVE

## 2019-01-07 SURGERY — FIXATION, FRACTURE, INTERTROCHANTERIC, WITH INTRAMEDULLARY ROD
Anesthesia: General | Site: Hip | Laterality: Left

## 2019-01-07 MED ORDER — EPHEDRINE 5 MG/ML INJ
INTRAVENOUS | Status: AC
  Filled 2019-01-07: qty 10

## 2019-01-07 MED ORDER — ARTIFICIAL TEARS OPHTHALMIC OINT
TOPICAL_OINTMENT | OPHTHALMIC | Status: AC
  Filled 2019-01-07: qty 3.5

## 2019-01-07 MED ORDER — SODIUM CHLORIDE 0.9 % IR SOLN
Status: DC | PRN
  Administered 2019-01-07: 1000 mL

## 2019-01-07 MED ORDER — ONDANSETRON HCL 4 MG/2ML IJ SOLN
INTRAMUSCULAR | Status: AC
  Filled 2019-01-07: qty 2

## 2019-01-07 MED ORDER — POTASSIUM CHLORIDE 10 MEQ/100ML IV SOLN
10.0000 meq | INTRAVENOUS | Status: AC
  Administered 2019-01-07 (×2): 10 meq via INTRAVENOUS
  Filled 2019-01-07: qty 100

## 2019-01-07 MED ORDER — PHENYLEPHRINE HCL 10 MG/ML IJ SOLN
INTRAMUSCULAR | Status: DC | PRN
  Administered 2019-01-07 (×2): 40 ug via INTRAVENOUS
  Administered 2019-01-07: 80 ug via INTRAVENOUS
  Administered 2019-01-07 (×2): 40 ug via INTRAVENOUS
  Administered 2019-01-07 (×2): 80 ug via INTRAVENOUS

## 2019-01-07 MED ORDER — PHENOL 1.4 % MT LIQD: 1.0000 | OROMUCOSAL | Status: DC | PRN

## 2019-01-07 MED ORDER — BUPIVACAINE IN DEXTROSE 0.75-8.25 % IT SOLN
INTRATHECAL | Status: AC
  Filled 2019-01-07: qty 2

## 2019-01-07 MED ORDER — PHENYLEPHRINE 40 MCG/ML (10ML) SYRINGE FOR IV PUSH (FOR BLOOD PRESSURE SUPPORT)
PREFILLED_SYRINGE | INTRAVENOUS | Status: AC
  Filled 2019-01-07: qty 10

## 2019-01-07 MED ORDER — GLYCOPYRROLATE PF 0.2 MG/ML IJ SOSY
PREFILLED_SYRINGE | INTRAMUSCULAR | Status: AC
  Filled 2019-01-07: qty 1

## 2019-01-07 MED ORDER — SUGAMMADEX SODIUM 500 MG/5ML IV SOLN
INTRAVENOUS | Status: DC | PRN
  Administered 2019-01-07: 119.8 mg via INTRAVENOUS

## 2019-01-07 MED ORDER — SUCCINYLCHOLINE CHLORIDE 20 MG/ML IJ SOLN
INTRAMUSCULAR | Status: DC | PRN
  Administered 2019-01-07: 100 mg via INTRAVENOUS

## 2019-01-07 MED ORDER — PROMETHAZINE HCL 25 MG/ML IJ SOLN: 6.2500 mg | INTRAMUSCULAR | Status: DC | PRN

## 2019-01-07 MED ORDER — ROCURONIUM BROMIDE 10 MG/ML (PF) SYRINGE
PREFILLED_SYRINGE | INTRAVENOUS | Status: AC
  Filled 2019-01-07: qty 10

## 2019-01-07 MED ORDER — HYDROMORPHONE HCL 1 MG/ML IJ SOLN
INTRAMUSCULAR | Status: AC
  Filled 2019-01-07: qty 0.5

## 2019-01-07 MED ORDER — MENTHOL 3 MG MT LOZG: 1.0000 | LOZENGE | OROMUCOSAL | Status: DC | PRN

## 2019-01-07 MED ORDER — METOCLOPRAMIDE HCL 10 MG PO TABS: 5.0000 mg | ORAL_TABLET | Freq: Three times a day (TID) | ORAL | Status: DC | PRN

## 2019-01-07 MED ORDER — BUPIVACAINE-EPINEPHRINE (PF) 0.5% -1:200000 IJ SOLN
INTRAMUSCULAR | Status: AC
  Filled 2019-01-07: qty 60

## 2019-01-07 MED ORDER — DOCUSATE SODIUM 100 MG PO CAPS
100.0000 mg | ORAL_CAPSULE | Freq: Two times a day (BID) | ORAL | Status: DC
  Administered 2019-01-07: 100 mg via ORAL
  Filled 2019-01-07: qty 1

## 2019-01-07 MED ORDER — ROCURONIUM BROMIDE 100 MG/10ML IV SOLN
INTRAVENOUS | Status: DC | PRN
  Administered 2019-01-07: 30 mg via INTRAVENOUS

## 2019-01-07 MED ORDER — POVIDONE-IODINE 10 % EX SWAB: 2.0000 "application " | Freq: Once | CUTANEOUS | Status: DC

## 2019-01-07 MED ORDER — FENTANYL CITRATE (PF) 100 MCG/2ML IJ SOLN
INTRAMUSCULAR | Status: AC
  Filled 2019-01-07: qty 2

## 2019-01-07 MED ORDER — ENOXAPARIN SODIUM 30 MG/0.3ML ~~LOC~~ SOLN
30.0000 mg | SUBCUTANEOUS | Status: DC
  Administered 2019-01-08 – 2019-01-12 (×5): 30 mg via SUBCUTANEOUS
  Filled 2019-01-07 (×6): qty 0.3

## 2019-01-07 MED ORDER — PROPOFOL 10 MG/ML IV BOLUS
INTRAVENOUS | Status: DC | PRN
  Administered 2019-01-07: 10 mg via INTRAVENOUS
  Administered 2019-01-07: 20 mg via INTRAVENOUS
  Administered 2019-01-07: 60 mg via INTRAVENOUS

## 2019-01-07 MED ORDER — SCOPOLAMINE 1 MG/3DAYS TD PT72
MEDICATED_PATCH | TRANSDERMAL | Status: AC
  Filled 2019-01-07: qty 1

## 2019-01-07 MED ORDER — CEFAZOLIN SODIUM-DEXTROSE 2-4 GM/100ML-% IV SOLN
2.0000 g | INTRAVENOUS | Status: AC
  Administered 2019-01-07: 2 g via INTRAVENOUS
  Filled 2019-01-07: qty 100

## 2019-01-07 MED ORDER — FENTANYL CITRATE (PF) 100 MCG/2ML IJ SOLN
INTRAMUSCULAR | Status: DC | PRN
  Administered 2019-01-07 (×2): 50 ug via INTRAVENOUS
  Administered 2019-01-07: 25 ug via INTRAVENOUS
  Administered 2019-01-07: 50 ug via INTRAVENOUS
  Administered 2019-01-07: 25 ug via INTRAVENOUS

## 2019-01-07 MED ORDER — HYDROMORPHONE HCL 2 MG/ML IJ SOLN
2.0000 mg | INTRAMUSCULAR | Status: DC | PRN
  Administered 2019-01-07 – 2019-01-08 (×3): 2 mg via INTRAVENOUS
  Filled 2019-01-07 (×3): qty 1

## 2019-01-07 MED ORDER — ONDANSETRON HCL 4 MG/2ML IJ SOLN
INTRAMUSCULAR | Status: DC | PRN
  Administered 2019-01-07: 4 mg via INTRAVENOUS

## 2019-01-07 MED ORDER — ALBUTEROL SULFATE (2.5 MG/3ML) 0.083% IN NEBU: 2.5000 mg | INHALATION_SOLUTION | Freq: Four times a day (QID) | RESPIRATORY_TRACT | Status: DC | PRN

## 2019-01-07 MED ORDER — LACTATED RINGERS IV SOLN
INTRAVENOUS | Status: DC
  Administered 2019-01-07: 16:00:00 via INTRAVENOUS
  Administered 2019-01-07: 1000 mL via INTRAVENOUS

## 2019-01-07 MED ORDER — CHLORHEXIDINE GLUCONATE 4 % EX LIQD: 60.0000 mL | Freq: Once | CUTANEOUS | Status: DC

## 2019-01-07 MED ORDER — METOCLOPRAMIDE HCL 5 MG/ML IJ SOLN: 5.0000 mg | Freq: Three times a day (TID) | INTRAMUSCULAR | Status: DC | PRN

## 2019-01-07 MED ORDER — SCOPOLAMINE 1 MG/3DAYS TD PT72
1.0000 | MEDICATED_PATCH | TRANSDERMAL | Status: DC
  Administered 2019-01-07: 1.5 mg via TRANSDERMAL

## 2019-01-07 MED ORDER — BUPIVACAINE-EPINEPHRINE (PF) 0.5% -1:200000 IJ SOLN
INTRAMUSCULAR | Status: DC | PRN
  Administered 2019-01-07: 60 mL

## 2019-01-07 MED ORDER — EPHEDRINE SULFATE 50 MG/ML IJ SOLN
INTRAMUSCULAR | Status: DC | PRN
  Administered 2019-01-07 (×3): 5 mg via INTRAVENOUS
  Administered 2019-01-07: 10 mg via INTRAVENOUS
  Administered 2019-01-07: 5 mg via INTRAVENOUS

## 2019-01-07 MED ORDER — SUCCINYLCHOLINE CHLORIDE 200 MG/10ML IV SOSY
PREFILLED_SYRINGE | INTRAVENOUS | Status: AC
  Filled 2019-01-07: qty 10

## 2019-01-07 MED ORDER — CEFAZOLIN SODIUM-DEXTROSE 2-4 GM/100ML-% IV SOLN
2.0000 g | Freq: Four times a day (QID) | INTRAVENOUS | Status: AC
  Administered 2019-01-07 – 2019-01-08 (×2): 2 g via INTRAVENOUS
  Filled 2019-01-07 (×2): qty 100

## 2019-01-07 MED ORDER — HYDROMORPHONE HCL 1 MG/ML IJ SOLN
0.5000 mg | INTRAMUSCULAR | Status: DC | PRN
  Administered 2019-01-07 (×3): 0.5 mg via INTRAVENOUS
  Filled 2019-01-07 (×2): qty 0.5

## 2019-01-07 MED ORDER — SUGAMMADEX SODIUM 200 MG/2ML IV SOLN
INTRAVENOUS | Status: AC
  Filled 2019-01-07: qty 4

## 2019-01-07 SURGICAL SUPPLY — 55 items
BIT DRILL AO GAMMA 4.2X300 (BIT) ×3 IMPLANT
BLADE HEX COATED 2.75 (ELECTRODE) ×3 IMPLANT
BLADE SURG SZ10 CARB STEEL (BLADE) ×6 IMPLANT
BNDG GAUZE ELAST 4 BULKY (GAUZE/BANDAGES/DRESSINGS) ×3 IMPLANT
CHLORAPREP W/TINT 26ML (MISCELLANEOUS) ×3 IMPLANT
CLOTH BEACON ORANGE TIMEOUT ST (SAFETY) ×3 IMPLANT
COVER LIGHT HANDLE STERIS (MISCELLANEOUS) ×6 IMPLANT
COVER MAYO STAND XLG (DRAPE) ×3 IMPLANT
DECANTER SPIKE VIAL GLASS SM (MISCELLANEOUS) ×6 IMPLANT
DRAPE STERI IOBAN 125X83 (DRAPES) ×3 IMPLANT
DRESSING ALLEVYN LIFE SACRUM (GAUZE/BANDAGES/DRESSINGS) ×2 IMPLANT
DRSG MEPILEX BORDER 4X12 (GAUZE/BANDAGES/DRESSINGS) ×3 IMPLANT
DRSG PAD ABDOMINAL 8X10 ST (GAUZE/BANDAGES/DRESSINGS) ×3 IMPLANT
ELECT REM PT RETURN 9FT ADLT (ELECTROSURGICAL) ×3
ELECTRODE REM PT RTRN 9FT ADLT (ELECTROSURGICAL) ×1 IMPLANT
GLOVE BIOGEL PI IND STRL 7.0 (GLOVE) ×2 IMPLANT
GLOVE BIOGEL PI INDICATOR 7.0 (GLOVE) ×6
GLOVE ECLIPSE 6.5 STRL STRAW (GLOVE) ×2 IMPLANT
GLOVE SKINSENSE NS SZ8.0 LF (GLOVE) ×2
GLOVE SKINSENSE STRL SZ8.0 LF (GLOVE) ×1 IMPLANT
GLOVE SS N UNI LF 8.5 STRL (GLOVE) ×3 IMPLANT
GOWN STRL REUS W/ TWL LRG LVL3 (GOWN DISPOSABLE) ×1 IMPLANT
GOWN STRL REUS W/TWL LRG LVL3 (GOWN DISPOSABLE) ×6 IMPLANT
GOWN STRL REUS W/TWL XL LVL3 (GOWN DISPOSABLE) ×3 IMPLANT
GUIDEROD T2 3X1000 (ROD) ×3 IMPLANT
INST SET MAJOR BONE (KITS) ×3 IMPLANT
K-WIRE  3.2X450M STR (WIRE) ×2
K-WIRE 3.2X450M STR (WIRE) ×1
KIT BLADEGUARD II DBL (SET/KITS/TRAYS/PACK) ×3 IMPLANT
KIT TURNOVER CYSTO (KITS) ×3 IMPLANT
KWIRE 3.2X450M STR (WIRE) ×1 IMPLANT
MANIFOLD NEPTUNE II (INSTRUMENTS) ×3 IMPLANT
MARKER SKIN DUAL TIP RULER LAB (MISCELLANEOUS) ×3 IMPLANT
NAIL TROCH GAMMA 11X18 (Nail) ×2 IMPLANT
NDL HYPO 21X1.5 SAFETY (NEEDLE) ×1 IMPLANT
NDL SPNL 18GX3.5 QUINCKE PK (NEEDLE) ×1 IMPLANT
NEEDLE HYPO 21X1.5 SAFETY (NEEDLE) ×3 IMPLANT
NEEDLE SPNL 18GX3.5 QUINCKE PK (NEEDLE) ×3 IMPLANT
NS IRRIG 1000ML POUR BTL (IV SOLUTION) ×3 IMPLANT
PACK BASIC III (CUSTOM PROCEDURE TRAY) ×3
PACK SRG BSC III STRL LF ECLPS (CUSTOM PROCEDURE TRAY) ×1 IMPLANT
PENCIL HANDSWITCHING (ELECTRODE) ×3 IMPLANT
SCREW LAG GAMMA 3 TI 10.5X90MM (Screw) ×2 IMPLANT
SCREW LOCKING T2 F/T  5MMX35MM (Screw) ×2 IMPLANT
SCREW LOCKING T2 F/T 5MMX35MM (Screw) IMPLANT
SET BASIN LINEN APH (SET/KITS/TRAYS/PACK) ×3 IMPLANT
SPONGE LAP 18X18 RF (DISPOSABLE) ×6 IMPLANT
STAPLER VISISTAT 35W (STAPLE) ×2 IMPLANT
SUT BRALON NAB BRD #1 30IN (SUTURE) ×3 IMPLANT
SUT MNCRL 0 VIOLET CTX 36 (SUTURE) ×1 IMPLANT
SUT MON AB 2-0 CT1 36 (SUTURE) ×3 IMPLANT
SUT MONOCRYL 0 CTX 36 (SUTURE) ×2
SYR 30ML LL (SYRINGE) ×3 IMPLANT
SYR BULB IRRIGATION 50ML (SYRINGE) ×6 IMPLANT
YANKAUER SUCT BULB TIP NO VENT (SUCTIONS) ×3 IMPLANT

## 2019-01-07 NOTE — Brief Op Note (Signed)
01/07/2019  3:30 PM  PATIENT:  Misty [name]  78 y.o. female  PRE-OPERATIVE DIAGNOSIS:  left hip fracture  POST-OPERATIVE DIAGNOSIS:  left hip fracture  PROCEDURE:  Procedure(s): INTRAMEDULLARY (IM) NAIL INTERTROCHANTRIC (Left) 40086  SURGEON:  Surgeon(s) and Role:    Carole Civil, MD - Primary  Open treatment internal fixation of the hip with gamma nail  Implant Stryker 125 degree gamma nail with 90 mm lag screw 35 mm distal locking screw proximal acorn and sliding mode  Preop diagnosis hip fracture intertrochanteric  Postoperative diagnosis same Procedure open treatment internal fixation of the hip Surgeon [place] Anesthesia General Surgical findings 3 part intertrochanteric fracture of the hip Local anesthetic quarter percent Marcaine 30 cc no epinephrine  The surgery was done as follows The patient was identified in the preop area the surgical site was confirmed and marked after thorough chart review including radiographs  The patient was brought back to the operating room for general anesthesia and then was placed on the fracture table. The operative leg was placed in traction the nonoperative leg was padded and placed in a boot and abducted  The C-arm was brought in and a closed reduction was performed with the C-arm.  Multiplane x-rays were taken and the leg was manipulated until a stable reduction was obtained using traction to obtain proper neck shaft angle and then rotation to correct rotational malalignment  The leg was prepped and draped with ChloraPrep. This was followed by timeout which confirmed as surgical site, implants, x-ray gowns and badges.  The incision was made over the left hip at the greater trochanter proximally, subcutaneous tissue was divided down to the fascial layer which was split in line with the skin and incision.  This was followed by blunt dissection with a finger down to the tip of the trochanter  The sharp tipped awl  was passed down to the level lesser stroke followed by insertion of a guidewire down to the knee.  X-ray confirmed position of the guidewire and proximal reamer was performed down to the level of the lesser trochanter.  We then passed 125 degree nail  Second incision was made subtenons tissue was divided muscle and fascia were split down to bone   After correction for rotational position a drill was used to breech the cortex of the lateral femur followed by insertion of a threaded guidewire which was placed in the center of the femoral [name] on AP and lateral x-ray  We measured the pin distance at 90 set the reamer at 90 and passed the reamer over the guidewire.  Lag screw was placed proximal portion of the nail was irrigated and acorn was placed in sliding mode confirmation was performed by toggling on the lag screw screwdriver handle  We next use the external guide to place a third incision and then passed a cannula with a sharp tip followed by drilling with a 4.2 drill bit and a 5.0  35 mm locking screw.  Final images confirmed reduction of the fracture hardware position.  The wound was irrigated with copious amounts of saline and closed in layered fashion starting with 0 Monocryl, #1 Bralon, 0 Monocryl and staples. We used 60 mL of Marcaine with epinephrine dividing it between each layer  A sterile bandage was applied  Postoperative plan Weightbearing as tolerated Staples can be removed postop day 12-14 Anticoagulation for 28 days Follow-up visit at 2 weeks for x-rays and then x-rays at 6 weeks and 12 weeks [id number]  SURGEON:  Surgeon(s) and Role:    * Carole Civil, MD - Primary   PHYSICIAN ASSISTANT:   ASSISTANTS: betty ashley   ANESTHESIA:   general  EBL:  100 mL   BLOOD ADMINISTERED:none  DRAINS: none   LOCAL MEDICATIONS USED:  MARCAINE     SPECIMEN:  No Specimen  DISPOSITION OF SPECIMEN:  N/A  COUNTS:  YES  TOURNIQUET:  * No tourniquets in log *  DICTATION:  .Dragon Dictation  PLAN OF CARE: Admit to inpatient   PATIENT DISPOSITION:  PACU - hemodynamically stable.   Delay start of Pharmacological VTE agent (>24hrs) due to surgical blood loss or risk of bleeding: no

## 2019-01-07 NOTE — Op Note (Addendum)
01/07/2019  3:33 PM  PATIENT:  [name]  78 y.o. female  PRE-OPERATIVE DIAGNOSIS:  left hip fracture  POST-OPERATIVE DIAGNOSIS:  left hip fracture  PROCEDURE:  Procedure(s): INTRAMEDULLARY (IM) NAIL INTERTROCHANTRIC (Left) 62376  SURGEON:  Surgeon(s) and Role:    Carole Civil, MD - Primary  Open treatment internal fixation of the hip with gamma nail  Implant Stryker 125 degree gamma nail with 90 mm lag screw 35 mm distal locking screw proximal acorn and sliding mode  Preop diagnosis hip fracture intertrochanteric  Postoperative diagnosis same Procedure open treatment internal fixation of the hip Surgeon [place] Anesthesia General Surgical findings 3 part intertrochanteric fracture of the hip Local anesthetic quarter percent Marcaine 30 cc no epinephrine  The surgery was done as follows The patient was identified in the preop area the surgical site was confirmed and marked after thorough chart review including radiographs  The patient was brought back to the operating room for general anesthesia and then was placed on the fracture table. The operative leg was placed in traction the nonoperative leg was padded and placed in a boot and abducted  The C-arm was brought in and a closed reduction was performed with the C-arm.  Multiplane x-rays were taken and the leg was manipulated until a stable reduction was obtained using traction to obtain proper neck shaft angle and then rotation to correct rotational malalignment  The leg was prepped and draped with ChloraPrep. This was followed by timeout which confirmed as surgical site, implants, x-ray gowns and badges.  The incision was made over the left hip at the greater trochanter proximally, subcutaneous tissue was divided down to the fascial layer which was split in line with the skin and incision.  This was followed by blunt dissection with a finger down to the tip of the trochanter  The sharp tipped awl  was passed down to the level lesser stroke followed by insertion of a guidewire down to the knee.  X-ray confirmed position of the guidewire and proximal reamer was performed down to the level of the lesser trochanter.  We then passed 125 degree nail  Second incision was made subtenons tissue was divided muscle and fascia were split down to bone   After correction for rotational position a drill was used to breech the cortex of the lateral femur followed by insertion of a threaded guidewire which was placed in the center of the femoral head on AP and lateral x-ray  We measured the pin distance at 90 set the reamer at 90 and passed the reamer over the guidewire.  Lag screw was placed proximal portion of the nail was irrigated and acorn was placed in sliding mode confirmation was performed by toggling on the lag screw screwdriver handle  We next use the external guide to place a third incision and then passed a cannula with a sharp tip followed by drilling with a 4.2 drill bit and a 5.0  35 mm locking screw.  Final images confirmed reduction of the fracture hardware position.  The wound was irrigated with copious amounts of saline and closed in layered fashion starting with 0 Monocryl, #1 Bralon, 0 Monocryl and staples. We used 60 mL of Marcaine with epinephrine dividing it between each layer  A sterile bandage was applied  Postoperative plan Weightbearing as tolerated Staples can be removed postop day 12-14 Anticoagulation for 28 days Follow-up visit at 2 weeks for x-rays and then x-rays at 6 weeks and 12 weeks [id number]  SURGEON:  Surgeon(s) and Role:    * Carole Civil, MD - Primary   PHYSICIAN ASSISTANT:   ASSISTANTS: betty ashley   ANESTHESIA:   general  EBL:  100 mL   BLOOD ADMINISTERED:none  DRAINS: none   LOCAL MEDICATIONS USED:  MARCAINE     SPECIMEN:  No Specimen  DISPOSITION OF SPECIMEN:  N/A  COUNTS:  YES  TOURNIQUET:  * No tourniquets in log *  DICTATION:  .Dragon Dictation  PLAN OF CARE: Admit to inpatient   PATIENT DISPOSITION:  PACU - hemodynamically stable.   Delay start of Pharmacological VTE agent (>24hrs) due to surgical blood loss or risk of bleeding: no

## 2019-01-07 NOTE — Transfer of Care (Signed)
Immediate Anesthesia Transfer of Care Note  Patient: Misty [name]  Procedure(s) Performed: INTRAMEDULLARY (IM) NAIL INTERTROCHANTRIC (Left Hip)  Patient Location: PACU  Anesthesia Type:General  Level of Consciousness: awake, oriented and patient cooperative  Airway & Oxygen Therapy: Patient Spontanous Breathing  Post-op Assessment: Report given to RN and Post -op Vital signs reviewed and stable  Post vital signs: Reviewed and stable  Last Vitals:  Vitals Value Taken Time  BP 144/76 01/07/2019  3:45 PM  Temp    Pulse 95 01/07/2019  3:47 PM  Resp 19 01/07/2019  3:47 PM  SpO2 94 % 01/07/2019  3:47 PM  Vitals shown include unvalidated device data.  Last Pain:  Vitals:   01/07/19 1306  TempSrc: Oral  PainSc: 9       Patients Stated Pain Goal: 5 (01/07/19 1306)  Complications: No apparent anesthesia complications

## 2019-01-07 NOTE — Progress Notes (Signed)
PROGRESS NOTE  Misty [name]  BOF:[id number]  DOB: 27-Sep-1941  DOA: 01/06/2019 PCP: Chipper Herb, MD   Brief Admission Hx: 78 y.o. female with medical history significant for pulmonary fibrosis, COPD- NOT on home O2., ischemic colitis, who presented to [facility] after a fall, with complaints of left hip pain.  MDM/Assessment & Plan:   1. Acute left femoral fracture - Plans are for patient to go to OR today with Dr. Aline Brochure for operative management.  Pt medically cleared to go to OR.  Pt remains NPO for now.  IV dilaudid ordered for pain.   2. Hypokalemia - improving, added additional runs of KCl this morning.  3. Chronic back pain - Pt reports 5 prior back operations and one done in last weeks.  IV pain meds ordered.  4. COPD / pulmonary fibrosis - Pt is on chronic home oxygen. Continue supplemental oxygen.  5. Depression/Anxiety - Pt is on alprazolam as needed for anxiety.  6. Frequent falls - PT eval when able.    DVT prophylaxis: SCDs Code Status: Full  Family Communication: husband at bedside  Disposition Plan: to OR later today    Consultants:  Orthopedics   Procedures:  Pending   Antimicrobials:  n/a   Subjective: Pt says that her pain remains poorly controlled.    Objective: Vitals:   01/07/19 0730 01/07/19 0800 01/07/19 0922 01/07/19 0929  BP: 120/64 (!) 138/93 132/75   Pulse: 72 77 85   Resp: 11 18 18    Temp:   97.6 F (36.4 C)   TempSrc:   Oral   SpO2: 97% 96% (!) 73% 96%  Weight:      Height:        Intake/Output Summary (Last 24 hours) at 01/07/2019 0932 Last data filed at 01/07/2019 7782 Gross per 24 hour  Intake 795 ml  Output 2500 ml  Net -1705 ml   Filed Weights   01/06/19 1633  Weight: 59 kg     REVIEW OF SYSTEMS  As per history otherwise all reviewed and reported negative  Exam:  General exam: awake, alert, NAD.  Respiratory system: wheezing heard. No increased work of breathing. Cardiovascular system: S1 & S2 heard. No  JVD, murmurs, gallops, clicks or pedal edema. Gastrointestinal system: Abdomen is nondistended, soft and nontender. Normal bowel sounds heard. Central nervous system: Alert and oriented. No focal neurological deficits. Extremities: pedal pulses bilateral, shortened LLE.   Data Reviewed: Basic Metabolic Panel: Recent Labs  Lab 01/06/19 1700 01/07/19 0513  NA 143 142  K 2.9* 3.2*  CL 104 102  CO2 29 33*  GLUCOSE 152* 142*  BUN 8 7*  CREATININE 0.66 0.61  CALCIUM 8.6* 8.1*  MG 1.7  --    Liver Function Tests: Recent Labs  Lab 01/06/19 1700  AST 23  ALT 20  ALKPHOS 90  BILITOT 0.4  PROT 6.3*  ALBUMIN 3.6   No results for input(s): LIPASE, AMYLASE in the last 168 hours. No results for input(s): AMMONIA in the last 168 hours. CBC: Recent Labs  Lab 01/06/19 1700 01/07/19 0513  WBC 11.6* 7.9  NEUTROABS 9.0*  --   HGB 11.2* 10.6*  HCT 37.9 36.7  MCV 95.9 96.1  PLT 171 174   Cardiac Enzymes: No results for input(s): CKTOTAL, CKMB, CKMBINDEX, TROPONINI in the last 168 hours. CBG (last 3)  No results for input(s): GLUCAP in the last 72 hours. No results found for this or any previous visit (from the past 240 hour(s)).  Studies: Dg Chest 1 View  Result Date: 01/06/2019 CLINICAL DATA:  78 year old female status post fall down steps. Pain. EXAM: CHEST  1 VIEW COMPARISON:  Chest radiographs 12/08/2018 and earlier. FINDINGS: AP supine view at 1903 hours. Multilevel previous spinal compression fracture vertebral augmentation. Stable lung volumes and mediastinal contours. Calcified aortic atherosclerosis. Visualized tracheal air column is within normal limits. Allowing for portable technique the lungs are clear. No pneumothorax or pleural effusion. No acute osseous abnormality identified. IMPRESSION: No acute cardiopulmonary abnormality or acute traumatic injury identified. Electronically Signed   By: Genevie Ann M.D.   On: 01/06/2019 19:35   Ct Head Wo Contrast  Result Date:  01/06/2019 CLINICAL DATA:  78 year old female status post fall down stairs. EXAM: CT HEAD WITHOUT CONTRAST TECHNIQUE: Contiguous axial images were obtained from the base of the skull through the vertex without intravenous contrast. COMPARISON:  None. FINDINGS: Brain: Incidental dural calcification. Cerebral volume is within normal limits for age. No midline shift, mass effect, or evidence of intracranial mass lesion. No ventriculomegaly. No acute intracranial hemorrhage identified. No cortically based acute infarct identified. Mild-to-moderate bilateral patchy white matter hypodensity. Vascular: Calcified atherosclerosis at the skull base. No suspicious intracranial vascular hyperdensity. Skull: Intact. Sinuses/Orbits: Tympanic cavities and mastoids are clear. Mild mucosal thickening or trace fluid in the left sphenoid sinus. Other: No scalp hematoma identified. Negative visible orbits. IMPRESSION: 1. No acute intracranial abnormality or acute traumatic injury identified. 2. Mild to moderate cerebral white matter changes, most commonly due to chronic small vessel disease. Electronically Signed   By: Genevie Ann M.D.   On: 01/06/2019 23:17   Dg Hip Unilat W Or Wo Pelvis 2-3 Views Left  Result Date: 01/06/2019 CLINICAL DATA:  78 year old female status post fall down steps. Pain. EXAM: DG HIP (WITH OR WITHOUT PELVIS) 2-3V LEFT COMPARISON:  [facility] Lumbar radiographs 04/16/2018. FINDINGS: Comminuted proximal left femur intertrochanteric fracture with anterior displacement of 1/2 shaft with, mild varus impaction, mildly displaced trochanter butterfly fragments. Left femoral head remains normally located. No superimposed acute fracture of the pelvis is identified. Grossly intact proximal right femur. Aortoiliac calcified atherosclerosis. Nonobstructed visible bowel gas pattern. IMPRESSION: Comminuted left femur intertrochanteric fracture with anterior displacement and mild varus impaction. Electronically  Signed   By: Genevie Ann M.D.   On: 01/06/2019 19:37   Scheduled Meds: . sodium chloride   Intravenous Once  . escitalopram  20 mg Oral QHS  . pantoprazole  40 mg Oral BID AC   Continuous Infusions: . dextrose 5 % and 0.9 % NaCl with KCl 40 mEq/L 75 mL/hr at 01/07/19 0029  . potassium chloride      Active Problems:   Displaced intertrochanteric fracture of left femur, initial encounter for closed fracture [facility])  Time spent:   Irwin Brakeman, MD [facility] 01/07/2019, 9:32 AM    LOS: 1 day  How to contact the [facility] Attending or Consulting provider [place] or covering provider during after hours [place], for this patient?  1. Check the care team in [facility] and look for a) attending/consulting [facility] provider listed and b) the [facility] team listed 2. Log into [email or web address] and use [place]'s universal password to access. If you do not have the password, please contact the hospital operator. 3. Locate the [facility] provider you are looking for under [facility] and page to a number that you can be directly reached. 4. If you still have difficulty reaching the provider, please page the [facility] (Director on Call)  for the Hospitalists listed on amion for assistance.

## 2019-01-07 NOTE — Consult Note (Signed)
HOSPITAL CONSULT   Patient ID: Misty [name], female   DOB: [birth year], 78 y.o.   MRN: [id number]  New patient  Requested by: Dr. Venita Sheffield  Reason for: Intertrochanteric fracture left hip  Chief Complaint  Patient presents with  . Hip Pain     Misty [name] is a 78 y.o. female.    78 year old female history of multiple vertebral body fracture status post kyphoplasty's x2 in the last month was going out the front door 3 steps tripped fell landed on her left side and fractured her left hip.  She presented to the emergency room unable to weight-bear complaining of left hip pain  Location left hip Duration date of injury February 19 Severity of pain 10 Quality of pain dull, sharp with movement Modified by worse with movement, cannot weight-bear  Review of Systems (all) Review of Systems  Constitutional: Negative for chills and fever.  HENT:       Headaches  Gastrointestinal: Positive for constipation, nausea and vomiting.  Musculoskeletal: Positive for back pain.  Neurological: Negative for sensory change.  Psychiatric/Behavioral: Positive for depression.  All other systems reviewed and are negative.   Past Medical History:  Diagnosis Date  . Anxiety    takes Xanax daily  . Arthritis    back  . Cataract   . Chronic back pain   . Constipation    OTC stool softener prn  . COPD (chronic obstructive pulmonary disease) ([place])   . Depression   . Diverticulosis   . GERD (gastroesophageal reflux disease)    takes Ranidine daily  . Hemorrhoids   . History of bronchitis   . History of migraine    many yrs ago  . History of shingles   . Hyperlipidemia   . Insomnia   . Migraines   . Nocturia   . Numbness    left leg  . Osteoporosis   . PONV (postoperative nausea and vomiting)   . Urinary frequency   . Urinary urgency      Past Surgical History:  Procedure Laterality Date  . ABDOMINAL HYSTERECTOMY    . COLONOSCOPY    . COLONOSCOPY N/A 02/17/2015    Procedure: COLONOSCOPY;  Surgeon: Rogene Houston, MD;  Location: [facility];  Service: Endoscopy;  Laterality: N/A;  110n - moved to 11:15 - Ann to notify pt  . ESOPHAGOGASTRODUODENOSCOPY    . ESOPHAGOGASTRODUODENOSCOPY N/A 01/29/2016   Procedure: ESOPHAGOGASTRODUODENOSCOPY (EGD);  Surgeon: Rogene Houston, MD;  Location: [facility];  Service: Endoscopy;  Laterality: N/A;  . ESOPHAGOGASTRODUODENOSCOPY N/A 10/14/2017   Procedure: ESOPHAGOGASTRODUODENOSCOPY (EGD);  Surgeon: Rogene Houston, MD;  Location: [facility];  Service: Endoscopy;  Laterality: N/A;  730  . EYE SURGERY Right 3/15   cataracts  . KYPHOPLASTY N/A 07/08/2014   Procedure: Thoracic Eleven Kyphoplasty;  Surgeon: Hosie Spangle, MD;  Location: [place] NEURO ORS;  Service: Neurosurgery;  Laterality: N/A;  . LUMBAR LAMINECTOMY/DECOMPRESSION MICRODISCECTOMY Bilateral 05/20/2013   Procedure: LUMBAR LAMINECTOMY/DECOMPRESSION MICRODISCECTOMY 1 LEVEL;  Surgeon: Hosie Spangle, MD;  Location: [place] NEURO ORS;  Service: Neurosurgery;  Laterality: Bilateral;  Bilateral Lumbar four-five laminotomy and left lumbar four-five microdiskectomy  . SPINE SURGERY     Dr Carloyn Manner -  vertebraplasty   Family History  Problem Relation Age of Onset  . Hypertension Mother   . Macular degeneration Father   . Heart disease Father   . Cirrhosis Son   . Heart disease Son   . Colon cancer Neg Hx  Social History Social History   Tobacco Use  . Smoking status: Current Some Day Smoker    Packs/day: 1.00    Years: 52.00    Pack years: 52.00    Types: Cigarettes    Start date: 11/18/1958    Last attempt to quit: 08/20/2011    Years since quitting: 7.3  . Smokeless tobacco: Never Used  . Tobacco comment: 1/2-1pack off and on all her life  Substance Use Topics  . Alcohol use: No    Alcohol/week: 0.0 standard drinks  . Drug use: No   Allergies  Allergen Reactions  . Codeine Nausea And Vomiting  . Tramadol Nausea And Vomiting  . Asa [Aspirin]  Other (See Comments)    Patient is unaware of allergy  . Azithromycin Other (See Comments)    Patient is unaware of allergy  . Celebrex [Celecoxib] Other (See Comments)    Irritated stomach   . Cymbalta [Duloxetine Hcl] Other (See Comments)    Felt groggy  . Prednisone Rash    She has seen the podiatrist and he had injected cortisone in her feet and she had also taken a peel at home. She had a severe reaction to her face with a rash and had to take Benadryl to resolve the symptom. She actually did get more cortisone shots, but no additional reactions to the shots.  . Vioxx [Rofecoxib] Other (See Comments)    Unknown  . Zelnorm [Tegaserod] Other (See Comments)    Patient is unaware of allergy  . Zocor [Simvastatin] Other (See Comments)    Patient is unaware of allergy    Current Facility-Administered Medications  Medication Dose Route Frequency Provider Last Rate Last Dose  . 0.9 %  sodium chloride infusion (Manually program via Guardrails IV Fluids)   Intravenous Once Carole Civil, MD      . acetaminophen (TYLENOL) tablet 650 mg  650 mg Oral Q6H PRN Emokpae, Ejiroghene E, MD       Or  . acetaminophen (TYLENOL) suppository 650 mg  650 mg Rectal Q6H PRN Emokpae, Ejiroghene E, MD      . albuterol (PROVENTIL HFA;VENTOLIN HFA) 108 (90 Base) MCG/ACT inhaler 2 puff  2 puff Inhalation Q6H PRN Emokpae, Ejiroghene E, MD      . ALPRAZolam Duanne Moron) tablet 0.5 mg  0.5 mg Oral TID PRN Emokpae, Ejiroghene E, MD   0.5 mg at 01/06/19 2330  . dexamethasone (DECADRON) injection 4 mg  4 mg Intravenous Once Dettinger, Vonna Kotyk A, MD      . dextrose 5 % and 0.9 % NaCl with KCl 40 mEq/L infusion   Intravenous Continuous Emokpae, Ejiroghene E, MD 75 mL/hr at 01/07/19 0029    . escitalopram (LEXAPRO) tablet 20 mg  20 mg Oral QHS Emokpae, Ejiroghene E, MD   20 mg at 01/06/19 2329  . HYDROcodone-acetaminophen (NORCO) 10-325 MG per tablet 1 tablet  1 tablet Oral Q6H PRN Emokpae, Ejiroghene E, MD      .  HYDROmorphone (DILAUDID) injection 2 mg  2 mg Intravenous Q6H PRN Emokpae, Ejiroghene E, MD   2 mg at 01/07/19 0639  . ondansetron (ZOFRAN) tablet 4 mg  4 mg Oral Q6H PRN Emokpae, Ejiroghene E, MD   4 mg at 01/06/19 2330   Or  . ondansetron (ZOFRAN) injection 4 mg  4 mg Intravenous Q6H PRN Emokpae, Ejiroghene E, MD   4 mg at 01/07/19 0037  . pantoprazole (PROTONIX) EC tablet 40 mg  40 mg Oral BID AC Emokpae, Ejiroghene  E, MD      . polyethylene glycol (MIRALAX / GLYCOLAX) packet 17 g  17 g Oral Daily PRN Emokpae, Ejiroghene E, MD       Current Outpatient Medications  Medication Sig Dispense Refill  . acetaminophen (TYLENOL) 500 MG tablet Take 500 mg by mouth every 6 (six) hours as needed for moderate pain or headache.     . albuterol (PROVENTIL HFA;VENTOLIN HFA) 108 (90 Base) MCG/ACT inhaler Inhale 2 puffs into the lungs every 6 (six) hours as needed for wheezing or shortness of breath (cough). 1 Inhaler 6  . ALPRAZolam (XANAX) 0.5 MG tablet Take 1 tablet (0.5 mg total) by mouth 3 (three) times daily as needed for anxiety. 90 tablet 5  . cholecalciferol (VITAMIN D) 1000 UNITS tablet Take 2,000-4,000 Units by mouth daily. Take 2000 units daily except take 4000 units on Saturday and Sunday.    . escitalopram (LEXAPRO) 20 MG tablet TAKE ONE TABLET AT BEDTIME (Patient taking differently: Take 20 mg by mouth at bedtime. ) 30 tablet 5  . FeFum-FePoly-FA-B Cmp-C-Biot (INTEGRA PLUS) CAPS Take 1 capsule by mouth daily. 30 capsule 11  . HYDROcodone-acetaminophen (NORCO) 10-325 MG tablet Take 1 tablet by mouth every 6 (six) hours as needed for moderate pain or severe pain.     . Multiple Vitamin (MULTIVITAMIN WITH MINERALS) TABS tablet Take 1 tablet by mouth daily. Centrum Silver    . nystatin (MYCOSTATIN) 100000 UNIT/ML suspension Take 5 mLs (500,000 Units total) by mouth 4 (four) times daily. Swish and spit 60 mL 3  . pantoprazole (PROTONIX) 40 MG tablet Take 1 tablet (40 mg total) by mouth 2 (two) times  daily before a meal. 60 tablet 5  . vitamin B-12 (CYANOCOBALAMIN) 1000 MCG tablet Take 1,000 mcg by mouth daily.       Physical Exam(=30) BP 139/74 (BP Location: Left Arm)   Pulse 74   Temp 97.9 F (36.6 C) (Oral)   Resp 16   Ht 5' 7"  (1.702 m)   Wt 59 kg   SpO2 96%   BMI 20.36 kg/m   Gen. Appearance body habitus ectomorphic, appearance normal frame small Peripheral vascular system no peripheral edema no peripheral skin changes normal color capillary refill perfusion temperature Lymph nodes groin negative Gait cannot weight-bear because of fracture left hip  Left Upper extremity  Inspection revealed no malalignment or asymmetry  Assessment of range of motion: Full range of motion was recorded  Assessment of stability: Elbow wrist and hand and shoulder were stable  Assessment of muscle strength and tone revealed grade 5 muscle strength and normal muscle tone  Skin was normal without rash lesion or ulceration  Right upper extremity  Inspection revealed no malalignment or asymmetry  Assessment of range of motion: Full range of motion was recorded  Assessment of stability: Elbow wrist and hand and shoulder were stable  Assessment of muscle strength and tone revealed grade 5 muscle strength and normal muscle tone  Skin was normal without rash lesion or ulceration  Right Lower extremity  Inspection revealed no malalignment or asymmetry  Assessment of range of motion: Full range of motion was recorded  Assessment of stability: Ankle, knee and hip were stable  Assessment of muscle strength and tone revealed grade 5 muscle strength and normal muscle tone  Skin was normal without rash lesion or ulceration  Left lower extremity Inspection revealed shortening of the left leg slight external rotation Assessment of range of motion: Hip range of motion deferred, ankle range  of motion normal Assessment of stability: Ankle and knee stable Assessment of muscle strength and tone revealed  normal muscle tone Skin was normal without rash lesion or ulceration   Coordination was tested by finger-to-nose nose and was normal Deep tendon reflexes were 2+ in the upper extremities and 2+ on the right lower extremity Examination of sensation by touch was normal  Mental status  Oriented to time person and place normal  Mood and affect normal without depression anxiety or agitation  Dx:   Data Reviewed  CBC Latest Ref Rng & Units 01/07/2019 01/06/2019 12/30/2018  WBC 4.0 - 10.5 K/uL 7.9 11.6(H) 7.6  Hemoglobin 12.0 - 15.0 g/dL 10.6(L) 11.2(L) 11.0(L)  Hematocrit 36.0 - 46.0 % 36.7 37.9 34.3  Platelets 150 - 400 K/uL 174 171 217   BMP Latest Ref Rng & Units 01/07/2019 01/06/2019 12/30/2018  Glucose 70 - 99 mg/dL 142(H) 152(H) 122(H)  BUN 8 - 23 mg/dL 7(L) 8 9  Creatinine 0.44 - 1.00 mg/dL 0.61 0.66 0.80  BUN/Creat Ratio 12 - 28 - - 11(L)  Sodium 135 - 145 mmol/L 142 143 145(H)  Potassium 3.5 - 5.1 mmol/L 3.2(L) 2.9(L) 3.6  Chloride 98 - 111 mmol/L 102 104 102  CO2 22 - 32 mmol/L 33(H) 29 25  Calcium 8.9 - 10.3 mg/dL 8.1(L) 8.6(L) 8.6(L)     I reviewed the following images and the reports and my independent interpretation is pelvis AP lateral left hip: Peritrochanteric fracture with 3 parts.  Posterior medial buttress appears intact.  Apex anterior angulation.   Assessment  LEFT HIP FRACTURE INTERTROCHANTERIC FRACTURE   Plan The procedure has been fully reviewed with the patient; The risks and benefits of surgery have been discussed and explained and understood. Alternative treatment has also been reviewed, questions were encouraged and answered. The postoperative plan is also been reviewed.   ORIF LEFT HIP, GAMMA NAIL    Carole Civil MD

## 2019-01-07 NOTE — Brief Op Note (Signed)
01/07/2019  3:33 PM  PATIENT:  Misty [name]  78 y.o. female  PRE-OPERATIVE DIAGNOSIS:  left hip fracture  POST-OPERATIVE DIAGNOSIS:  left hip fracture  PROCEDURE:  Procedure(s): INTRAMEDULLARY (IM) NAIL INTERTROCHANTRIC (Left) 37106  SURGEON:  Surgeon(s) and Role:    Carole Civil, MD - Primary  Open treatment internal fixation of the hip with gamma nail  Implant Stryker 125 degree gamma nail with 90 mm lag screw 35 mm distal locking screw proximal acorn and sliding mode  Preop diagnosis hip fracture intertrochanteric  Postoperative diagnosis same Procedure open treatment internal fixation of the hip Surgeon [place] Anesthesia General Surgical findings 3 part intertrochanteric fracture of the hip Local anesthetic quarter percent Marcaine 30 cc no epinephrine  The surgery was done as follows The patient was identified in the preop area the surgical site was confirmed and marked after thorough chart review including radiographs  The patient was brought back to the operating room for general anesthesia and then was placed on the fracture table. The operative leg was placed in traction the nonoperative leg was padded and placed in a boot and abducted  The C-arm was brought in and a closed reduction was performed with the C-arm.  Multiplane x-rays were taken and the leg was manipulated until a stable reduction was obtained using traction to obtain proper neck shaft angle and then rotation to correct rotational malalignment  The leg was prepped and draped with ChloraPrep. This was followed by timeout which confirmed as surgical site, implants, x-ray gowns and badges.  The incision was made over the left hip at the greater trochanter proximally, subcutaneous tissue was divided down to the fascial layer which was split in line with the skin and incision.  This was followed by blunt dissection with a finger down to the tip of the trochanter  The sharp tipped awl  was passed down to the level lesser stroke followed by insertion of a guidewire down to the knee.  X-ray confirmed position of the guidewire and proximal reamer was performed down to the level of the lesser trochanter.  We then passed 125 degree nail  Second incision was made subtenons tissue was divided muscle and fascia were split down to bone   After correction for rotational position a drill was used to breech the cortex of the lateral femur followed by insertion of a threaded guidewire which was placed in the center of the femoral [name] on AP and lateral x-ray  We measured the pin distance at 90 set the reamer at 90 and passed the reamer over the guidewire.  Lag screw was placed proximal portion of the nail was irrigated and acorn was placed in sliding mode confirmation was performed by toggling on the lag screw screwdriver handle  We next use the external guide to place a third incision and then passed a cannula with a sharp tip followed by drilling with a 4.2 drill bit and a 5.0  35 mm locking screw.  Final images confirmed reduction of the fracture hardware position.  The wound was irrigated with copious amounts of saline and closed in layered fashion starting with 0 Monocryl, #1 Bralon, 0 Monocryl and staples. We used 60 mL of Marcaine with epinephrine dividing it between each layer  A sterile bandage was applied  Postoperative plan Weightbearing as tolerated Staples can be removed postop day 12-14 Anticoagulation for 28 days Follow-up visit at 2 weeks for x-rays and then x-rays at 6 weeks and 12 weeks [id number]  SURGEON:  Surgeon(s) and Role:    * Carole Civil, MD - Primary   PHYSICIAN ASSISTANT:   ASSISTANTS: betty ashley   ANESTHESIA:   general  EBL:  100 mL   BLOOD ADMINISTERED:none  DRAINS: none   LOCAL MEDICATIONS USED:  MARCAINE     SPECIMEN:  No Specimen  DISPOSITION OF SPECIMEN:  N/A  COUNTS:  YES  TOURNIQUET:  * No tourniquets in log *  DICTATION:  .Dragon Dictation  PLAN OF CARE: Admit to inpatient   PATIENT DISPOSITION:  PACU - hemodynamically stable.   Delay start of Pharmacological VTE agent (>24hrs) due to surgical blood loss or risk of bleeding: no

## 2019-01-07 NOTE — Anesthesia Procedure Notes (Signed)
Procedure Name: Intubation Date/Time: 01/07/2019 2:05 PM Performed by: Vista Deck, CRNA Pre-anesthesia Checklist: Patient identified, Patient being monitored, Timeout performed, Emergency Drugs available and Suction available Patient Re-evaluated:Patient Re-evaluated prior to induction Oxygen Delivery Method: Circle System Utilized Preoxygenation: Pre-oxygenation with 100% oxygen Induction Type: IV induction, Rapid sequence and Cricoid Pressure applied Laryngoscope Size: Mac and 3 Grade View: Grade I Tube type: Oral Tube size: 7.0 mm Number of attempts: 1 Airway Equipment and Method: stylet Placement Confirmation: ETT inserted through vocal cords under direct vision,  positive ETCO2 and breath sounds checked- equal and bilateral Secured at: 21 cm Tube secured with: Tape Dental Injury: Teeth and Oropharynx as per pre-operative assessment

## 2019-01-07 NOTE — Anesthesia Preprocedure Evaluation (Signed)
Anesthesia Evaluation  Patient identified by MRN, date of birth, ID band Patient awake    Reviewed: Allergy & Precautions, NPO status , Patient's Chart, lab work & pertinent test results  History of Anesthesia Complications (+) PONV  Airway Mallampati: II  TM Distance: >3 FB Neck ROM: Full    Dental no notable dental hx. (+) Edentulous Upper   Pulmonary COPD,  COPD inhaler, Current Smoker,  Interstitial fibrosis - denies home oxygen  C/w 1PPD   Pulmonary exam normal breath sounds clear to auscultation       Cardiovascular Exercise Tolerance: Good negative cardio ROS Normal cardiovascular examII Rhythm:Regular Rate:Normal     Neuro/Psych  Headaches, Anxiety Depression negative psych ROS   GI/Hepatic Neg liver ROS, GERD  Medicated and Controlled,  Endo/Other  negative endocrine ROS  Renal/GU negative Renal ROS  negative genitourinary   Musculoskeletal  (+) Arthritis , Osteoarthritis,    Abdominal   Peds negative pediatric ROS (+)  Hematology negative hematology ROS (+) anemia ,   Anesthesia Other Findings   Reproductive/Obstetrics negative OB ROS                             Anesthesia Physical Anesthesia Plan  ASA: III  Anesthesia Plan: General   Post-op Pain Management:    Induction: Intravenous  PONV Risk Score and Plan:   Airway Management Planned: Oral ETT  Additional Equipment:   Intra-op Plan:   Post-operative Plan: Extubation in OR  Informed Consent: I have reviewed the patients History and Physical, chart, labs and discussed the procedure including the risks, benefits and alternatives for the proposed anesthesia with the patient or authorized representative who has indicated his/her understanding and acceptance.     Dental advisory given  Plan Discussed with: CRNA  Anesthesia Plan Comments: (D/w pt poss post op ventilation as needed )         Anesthesia Quick Evaluation

## 2019-01-08 ENCOUNTER — Encounter (HOSPITAL_COMMUNITY): Payer: Self-pay | Admitting: Orthopedic Surgery

## 2019-01-08 LAB — CBC
HCT: 30.4 % — ABNORMAL LOW (ref 36.0–46.0)
Hemoglobin: 8.6 g/dL — ABNORMAL LOW (ref 12.0–15.0)
MCH: 27.7 pg (ref 26.0–34.0)
MCHC: 28.3 g/dL — ABNORMAL LOW (ref 30.0–36.0)
MCV: 98.1 fL (ref 80.0–100.0)
Platelets: 154 10*3/uL (ref 150–400)
RBC: 3.1 MIL/uL — ABNORMAL LOW (ref 3.87–5.11)
RDW: 16.8 % — ABNORMAL HIGH (ref 11.5–15.5)
WBC: 10 10*3/uL (ref 4.0–10.5)
nRBC: 0 % (ref 0.0–0.2)

## 2019-01-08 LAB — BASIC METABOLIC PANEL
Anion gap: 7 (ref 5–15)
BUN: 13 mg/dL (ref 8–23)
CO2: 34 mmol/L — ABNORMAL HIGH (ref 22–32)
Calcium: 8.4 mg/dL — ABNORMAL LOW (ref 8.9–10.3)
Chloride: 101 mmol/L (ref 98–111)
Creatinine, Ser: 0.77 mg/dL (ref 0.44–1.00)
GFR calc Af Amer: >60 mL/min (ref >60)
GFR calc non Af Amer: >60 mL/min (ref >60)
GLUCOSE: 114 mg/dL — AB (ref 70–99)
Potassium: 3.9 mmol/L (ref 3.5–5.1)
Sodium: 142 mmol/L (ref 135–145)

## 2019-01-08 LAB — MAGNESIUM: Magnesium: 1.6 mg/dL — ABNORMAL LOW (ref 1.7–2.4)

## 2019-01-08 MED ORDER — HYDROCODONE-ACETAMINOPHEN 10-325 MG PO TABS
1.0000 | ORAL_TABLET | Freq: Four times a day (QID) | ORAL | Status: DC | PRN
  Administered 2019-01-08 – 2019-01-12 (×9): 1 via ORAL
  Filled 2019-01-08 (×9): qty 1

## 2019-01-08 MED ORDER — HYDROMORPHONE HCL 1 MG/ML IJ SOLN
1.0000 mg | INTRAMUSCULAR | Status: DC | PRN
  Administered 2019-01-09 (×3): 1 mg via INTRAVENOUS
  Filled 2019-01-08 (×3): qty 1

## 2019-01-08 MED ORDER — OSELTAMIVIR PHOSPHATE 30 MG PO CAPS
30.0000 mg | ORAL_CAPSULE | Freq: Every day | ORAL | Status: DC
  Administered 2019-01-08 – 2019-01-12 (×5): 30 mg via ORAL
  Filled 2019-01-08 (×4): qty 1

## 2019-01-08 MED ORDER — DOCUSATE SODIUM 100 MG PO CAPS
200.0000 mg | ORAL_CAPSULE | Freq: Two times a day (BID) | ORAL | Status: DC
  Administered 2019-01-08 – 2019-01-12 (×8): 200 mg via ORAL
  Filled 2019-01-08 (×8): qty 2

## 2019-01-08 NOTE — NC FL2 (Signed)
[place] MEDICAID FL2 LEVEL OF CARE SCREENING TOOL     IDENTIFICATION  Patient Name: Misty [name] Birthdate: October 07, 1941 Sex: female Admission Date (Current Location): 01/06/2019  [facility] and [place] Number:  [employer] and Address:  [facility],  [address], [place] [address]      Provider Number: [id number]  Attending Physician Name and Address:  Cleora Fleet, MD  Relative Name and Phone Number:       Current Level of Care: Hospital Recommended Level of Care: Skilled Nursing Facility Prior Approval Number:    Date Approved/Denied:   PASRR Number: [id number]  Discharge Plan: SNF    Current Diagnoses: Patient Active Problem List   Diagnosis Date Noted  . Displaced intertrochanteric fracture of left femur, initial encounter for closed fracture (HCC) 01/06/2019  . Pulmonary fibrosis (HCC) 04/07/2018  . Non-intractable vomiting with nausea 10/06/2017  . Malnutrition of moderate degree 01/29/2016  . Symptomatic anemia 01/27/2016  . Generalized weakness 01/27/2016  . Abnormal chest x-ray 01/27/2016  . Tobacco use disorder 01/27/2016  . Colitis   . Hematochezia 02/26/2015  . Ischemic colitis (HCC) 02/26/2015  . Orthostatic hypotension 02/26/2015  . Non-traumatic compression fracture of T11 thoracic vertebra 07/08/2014  . Hyperlipidemia 08/19/2013  . Fibrocystic breast disease 08/19/2013  . History of migraine headaches 08/19/2013  . GERD (gastroesophageal reflux disease) 04/07/2013  . Osteoporosis, postmenopausal 04/07/2013    Orientation RESPIRATION BLADDER Height & Weight     Self, Time, Situation, Place  O2(4L) Incontinent Weight: 132 lb 0.9 oz (59.9 kg) Height:  5\' 7"  (170.2 cm)  BEHAVIORAL SYMPTOMS/MOOD NEUROLOGICAL BOWEL NUTRITION STATUS      Continent Diet(Regular)  AMBULATORY STATUS COMMUNICATION OF NEEDS Skin   Extensive Assist Verbally Surgical wounds(left hip)                       Personal Care  Assistance Level of Assistance  Bathing, Feeding, Dressing Bathing Assistance: Limited assistance Feeding assistance: Independent Dressing Assistance: Limited assistance     Functional Limitations Info  Sight, Hearing, Speech Sight Info: Adequate Hearing Info: Adequate Speech Info: Adequate    SPECIAL CARE FACTORS FREQUENCY  PT (By licensed PT)     PT Frequency: 5x/week              Contractures Contractures Info: Not present    Additional Factors Info  Code Status, Allergies, Psychotropic Code Status Info: Full Code Allergies Info: Codeine, Tramadol, Asa, Azithromycin, Celebrex, Cymbalta, Prednisone, Vioxx, Zelnorm, Zocor Psychotropic Info: Xanax, Lexapro         Current Medications (01/08/2019):  This is the current hospital active medication list Current Facility-Administered Medications  Medication Dose Route Frequency Provider Last Rate Last Dose  . acetaminophen (TYLENOL) tablet 650 mg  650 mg Oral Q6H PRN Vickki Hearing, MD       Or  . acetaminophen (TYLENOL) suppository 650 mg  650 mg Rectal Q6H PRN Vickki Hearing, MD      . albuterol (PROVENTIL) (2.5 MG/3ML) 0.083% nebulizer solution 2.5 mg  2.5 mg Nebulization Q6H PRN Vickki Hearing, MD      . ALPRAZolam Prudy Feeler) tablet 0.5 mg  0.5 mg Oral TID PRN Vickki Hearing, MD   0.5 mg at 01/07/19 1901  . docusate sodium (COLACE) capsule 200 mg  200 mg Oral BID Johnson, Clanford L, MD      . enoxaparin (LOVENOX) injection 30 mg  30 mg Subcutaneous Q24H Vickki Hearing, MD  30 mg at 01/08/19 0809  . escitalopram (LEXAPRO) tablet 20 mg  20 mg Oral QHS Vickki Hearing, MD   20 mg at 01/07/19 2204  . HYDROcodone-acetaminophen (NORCO) 10-325 MG per tablet 1 tablet  1 tablet Oral Q6H PRN Johnson, Clanford L, MD      . HYDROmorphone (DILAUDID) injection 1 mg  1 mg Intravenous Q4H PRN Johnson, Clanford L, MD      . menthol-cetylpyridinium (CEPACOL) lozenge 3 mg  1 lozenge Oral PRN Vickki Hearing,  MD       Or  . phenol (CHLORASEPTIC) mouth spray 1 spray  1 spray Mouth/Throat PRN Vickki Hearing, MD      . metoCLOPramide (REGLAN) tablet 5-10 mg  5-10 mg Oral Q8H PRN Vickki Hearing, MD       Or  . metoCLOPramide (REGLAN) injection 5-10 mg  5-10 mg Intravenous Q8H PRN Vickki Hearing, MD      . ondansetron [facility]) tablet 4 mg  4 mg Oral Q6H PRN Vickki Hearing, MD   4 mg at 01/06/19 2330   Or  . ondansetron (ZOFRAN) injection 4 mg  4 mg Intravenous Q6H PRN Vickki Hearing, MD   4 mg at 01/08/19 0222  . pantoprazole (PROTONIX) EC tablet 40 mg  40 mg Oral BID AC Vickki Hearing, MD   40 mg at 01/08/19 3013  . polyethylene glycol (MIRALAX / GLYCOLAX) packet 17 g  17 g Oral Daily PRN Vickki Hearing, MD         Discharge Medications: Please see discharge summary for a list of discharge medications.  Relevant Imaging Results:  Relevant Lab Results:   Additional Information SSN [id number]  Annice Needy, LCSW

## 2019-01-08 NOTE — Addendum Note (Signed)
Addendum  created 01/08/19 4709 by Despina Hidden, CRNA   Clinical Note Signed

## 2019-01-08 NOTE — Anesthesia Postprocedure Evaluation (Signed)
Anesthesia Post Note  Patient: Misty [name]  Procedure(s) Performed: INTRAMEDULLARY (IM) NAIL INTERTROCHANTRIC (Left Hip)  Patient location during evaluation: Nursing Unit Anesthesia Type: General Level of consciousness: awake and patient cooperative Pain management: pain level controlled Vital Signs Assessment: post-procedure vital signs reviewed and stable Respiratory status: spontaneous breathing, nonlabored ventilation and respiratory function stable Cardiovascular status: blood pressure returned to baseline Postop Assessment: no apparent nausea or vomiting Anesthetic complications: no     Last Vitals:  Vitals:   01/08/19 0200 01/08/19 0700  BP: 121/76 108/75  Pulse: 89 82  Resp: 20 16  Temp: 36.8 C 37.1 C  SpO2: 98% 98%    Last Pain:  Vitals:   01/08/19 0200  TempSrc:   PainSc: Asleep                 Ashya Nicolaisen J

## 2019-01-08 NOTE — Evaluation (Signed)
Physical Therapy Evaluation Patient Details Name: Misty [name] MRN: [id number] DOB: 21-Mar-1941 Today's Date: 01/08/2019   History of Present Illness  Misty [name] is a 78 y.o. female s/p left hip ORIF secondary to fracture 01/07/19 with medical history significant for pulmonary fibrosis, COPD- NOT on home O2., ischemic colitis, who presented to [facility] ED after a fall, with complaints of left hip pain.    Clinical Impression  Patient functioning well below baseline and limited for functional mobility and giat as stated below secondary to BLE weakness, fatigue, severe pain LLE and poor standing balance.  Patient limited to a few shuffling side steps mostly on RLE to transfer to chair and tolerated sitting up after therapy with her spouse present in room.  Patient will benefit from continued physical therapy in hospital and recommended venue below to increase strength, balance, endurance for safe ADLs and gait.    Follow Up Recommendations SNF    Equipment Recommendations  None recommended by PT    Recommendations for Other Services       Precautions / Restrictions Precautions Precautions: Fall Restrictions Weight Bearing Restrictions: Yes LLE Weight Bearing: Weight bearing as tolerated      Mobility  Bed Mobility Overal bed mobility: Needs Assistance Bed Mobility: Supine to Sit     Supine to sit: Max assist     General bed mobility comments: slow labored movement  Transfers Overall transfer level: Needs assistance Equipment used: Rolling walker (2 wheeled) Transfers: Sit to/from [employer] Sit to Stand: Mod assist;Max assist Stand pivot transfers: Max assist          Ambulation/Gait Ambulation/Gait assistance: Max assist Gait Distance (Feet): 3 Feet Assistive device: Rolling walker (2 wheeled) Gait Pattern/deviations: Decreased step length - right;Decreased step length - left;Decreased stance time - left;Decreased stride length;Shuffle Gait  velocity: slow   General Gait Details: limited to 5-6 slow labored shuffling steps with poor tolerance for weightbearing on LLE due to pain/weakness  Stairs            Wheelchair Mobility    Modified Rankin (Stroke Patients Only)       Balance Overall balance assessment: Needs assistance Sitting-balance support: Feet supported;Bilateral upper extremity supported Sitting balance-Leahy Scale: Poor Sitting balance - Comments: fair/poor with frequent leaning to the right due to left hip pain   Standing balance support: Bilateral upper extremity supported;During functional activity Standing balance-Leahy Scale: Poor Standing balance comment: using RW                             Pertinent Vitals/Pain Pain Assessment: 0-10 Pain Score: 9  Pain Location: left hip Pain Descriptors / Indicators: Sore;Sharp Pain Intervention(s): Limited activity within patient's tolerance;Monitored during session;Premedicated before session    Home Living Family/patient expects to be discharged to:: Private residence Living Arrangements: Spouse/significant other Available Help at Discharge: Family Type of Home: House Home Access: Stairs to enter Entrance Stairs-Rails: Left Entrance Stairs-Number of Steps: 3 Home Layout: One level Home Equipment: Cane - single point;Walker - 2 wheels;Shower seat      Prior Function Level of Independence: Independent         Comments: household ambulator with SPC     Hand Dominance        Extremity/Trunk Assessment   Upper Extremity Assessment Upper Extremity Assessment: Generalized weakness    Lower Extremity Assessment Lower Extremity Assessment: Generalized weakness;RLE deficits/detail;LLE deficits/detail RLE Deficits / Details: grossly 4/5 LLE Deficits /  Details: grossly -3/5    Cervical / Trunk Assessment Cervical / Trunk Assessment: Kyphotic  Communication   Communication: No difficulties  Cognition Arousal/Alertness:  Awake/alert;Lethargic;Suspect due to medications Behavior During Therapy: [facility] for tasks assessed/performed Overall Cognitive Status: Within Functional Limits for tasks assessed                                 General Comments: slightly lethargic possibly due to pain medication      General Comments      Exercises     Assessment/Plan    PT Assessment Patient needs continued PT services  PT Problem List Decreased strength;Decreased activity tolerance;Decreased balance;Decreased mobility;Pain       PT Treatment Interventions Gait training;Functional mobility training;Patient/family education;Stair training;Therapeutic activities;Therapeutic exercise    PT Goals (Current goals can be found in the Care Plan section)  Acute Rehab PT Goals Patient Stated Goal: return home PT Goal Formulation: With patient/family Time For Goal Achievement: 01/22/19 Potential to Achieve Goals: Good    Frequency Min 4X/week   Barriers to discharge        Co-evaluation               AM-PAC PT "6 Clicks" Mobility  Outcome Measure Help needed turning from your back to your side while in a flat bed without using bedrails?: A Lot Help needed moving from lying on your back to sitting on the side of a flat bed without using bedrails?: A Lot Help needed moving to and from a bed to a chair (including a wheelchair)?: A Lot Help needed standing up from a chair using your arms (e.g., wheelchair or bedside chair)?: A Lot Help needed to walk in hospital room?: Total Help needed climbing 3-5 steps with a railing? : Total 6 Click Score: 10    End of Session Equipment Utilized During Treatment: Gait belt;Oxygen Activity Tolerance: Patient tolerated treatment well;Patient limited by fatigue;Patient limited by pain Patient left: in chair;with call bell/phone within reach;with family/visitor present Nurse Communication: Mobility status PT Visit Diagnosis: Unsteadiness on feet (R26.81);Other  abnormalities of gait and mobility (R26.89);Muscle weakness (generalized) (M62.81)    Time: 7209-4709 PT Time Calculation (min) (ACUTE ONLY): 20 min   Charges:   PT Evaluation $PT Eval Moderate Complexity: 1 Mod PT Treatments $Therapeutic Activity: 23-37 mins        12:17 PM, 01/08/19 [name], MPT Physical Therapist with [facility] [phone] office [phone] mobile phone

## 2019-01-08 NOTE — Anesthesia Postprocedure Evaluation (Signed)
Anesthesia Post Note  Patient: Misty [name]  Procedure(s) Performed: INTRAMEDULLARY (IM) NAIL INTERTROCHANTRIC (Left Hip)  Patient location during evaluation: Nursing Unit Anesthesia Type: General Level of consciousness: awake and alert and patient cooperative Pain management: satisfactory to patient Vital Signs Assessment: post-procedure vital signs reviewed and stable Respiratory status: spontaneous breathing and patient connected to nasal cannula oxygen Cardiovascular status: stable Postop Assessment: no apparent nausea or vomiting Anesthetic complications: no     Last Vitals:  Vitals:   01/08/19 0200 01/08/19 0700  BP: 121/76 108/75  Pulse: 89 82  Resp: 20 16  Temp: 36.8 C 37.1 C  SpO2: 98% 98%    Last Pain:  Vitals:   01/08/19 0200  TempSrc:   PainSc: Asleep                 Jeorgia Helming

## 2019-01-08 NOTE — Clinical Social Work Placement (Signed)
   CLINICAL SOCIAL WORK PLACEMENT  NOTE  Date:  01/08/2019  Patient Details  Name: Misty [name] MRN: [id number] Date of Birth: 12-24-1940  Clinical Social Work is seeking post-discharge placement for this patient at the Skilled  Nursing Facility level of care (*CSW will initial, date and re-position this form in  chart as items are completed):  Yes   Patient/family provided with [facility] Clinical Social Work Department's list of facilities offering this level of care within the geographic area requested by the patient (or if unable, by the patient's family).  Yes   Patient/family informed of their freedom to choose among providers that offer the needed level of care, that participate in Medicare, Medicaid or managed care program needed by the patient, have an available bed and are willing to accept the patient.  Yes   Patient/family informed of [facility]'s ownership interest in [facility] and [facility], as well as of the fact that they are under no obligation to receive care at these facilities.  PASRR submitted to EDS on 01/08/19     PASRR number received on 01/08/19     Existing PASRR number confirmed on       FL2 transmitted to all facilities in geographic area requested by pt/family on 01/08/19     FL2 transmitted to all facilities within larger geographic area on       Patient informed that his/her managed care company has contracts with or will negotiate with certain facilities, including the following:            Patient/family informed of bed offers received.  Patient chooses bed at       Physician recommends and patient chooses bed at      Patient to be transferred to   on  .  Patient to be transferred to facility by       Patient family notified on   of transfer.  Name of family member notified:        PHYSICIAN       Additional Comment:    _______________________________________________ Annice Needy, LCSW 01/08/2019, 2:14 PM

## 2019-01-08 NOTE — Progress Notes (Signed)
01/08/2019 5:54 PM  Staff member tested positive for flu A. Pt was exposed.  Per ID Dr. Megan Salon start prophylaxis.   CJ MD

## 2019-01-08 NOTE — Progress Notes (Signed)
PROGRESS NOTE  Misty [name]  RJJ:[id number]  DOB: 03/12/1941  DOA: 01/06/2019 PCP: Chipper Herb, MD   Brief Admission Hx: 78 y.o. female with medical history significant for pulmonary fibrosis, COPD- NOT on home O2., ischemic colitis, who presented to [facility] after a fall, with complaints of left hip pain.  MDM/Assessment & Plan:   1. Acute left femoral fracture - POD#1 s/p ORIF left hip gamma nail. Post op per ortho.  PT eval pending.  2. Acute blood loss anemia - follow closely and transfuse as needed.  3. Hypokalemia - improving, added additional runs of KCl this morning.  4. Chronic back pain - Pt reports 5 prior back operations and one done in last weeks.  IV pain meds ordered.  5. COPD / pulmonary fibrosis - Pt is on chronic home oxygen. Continue supplemental oxygen.  6. Depression/Anxiety - Pt is on alprazolam as needed for anxiety.  7. Frequent falls - PT eval when able. May need SNF.    DVT prophylaxis: SCDs Code Status: Full  Family Communication: husband at bedside  Disposition Plan: PT eval pending.    Consultants:  Orthopedics   Procedures:  ORIF left hip gamma nail  Antimicrobials:  n/a   Subjective: Pt says she is willing to work with PT.  Pain is controlled.     Objective: Vitals:   01/07/19 1643 01/07/19 2200 01/08/19 0200 01/08/19 0700  BP: 132/78 116/71 121/76 108/75  Pulse: 83 83 89 82  Resp: 16 18 20 16   Temp: (!) 97.4 F (36.3 C) (!) 97.4 F (36.3 C) 98.2 F (36.8 C) 98.7 F (37.1 C)  TempSrc: Oral Oral    SpO2: 96% 98% 98% 98%  Weight:      Height:        Intake/Output Summary (Last 24 hours) at 01/08/2019 1006 Last data filed at 01/08/2019 0700 Gross per 24 hour  Intake 1000 ml  Output 1350 ml  Net -350 ml   Filed Weights   01/06/19 1633 01/07/19 0929  Weight: 59 kg 59.9 kg   REVIEW OF SYSTEMS  As per history otherwise all reviewed and reported negative  Exam:  General exam: awake, alert, NAD.  Respiratory system:  wheezing heard. No increased work of breathing. Cardiovascular system: S1 & S2 heard. No JVD, murmurs, gallops, clicks or pedal edema. Gastrointestinal system: Abdomen is nondistended, soft and nontender. Normal bowel sounds heard. Central nervous system: Alert and oriented. No focal neurological deficits. Extremities:  LLE bandages clean and dry normal limb alignment.   Data Reviewed: Basic Metabolic Panel: Recent Labs  Lab 01/06/19 1700 01/07/19 0513 01/08/19 0621  NA 143 142 142  K 2.9* 3.2* 3.9  CL 104 102 101  CO2 29 33* 34*  GLUCOSE 152* 142* 114*  BUN 8 7* 13  CREATININE 0.66 0.61 0.77  CALCIUM 8.6* 8.1* 8.4*  MG 1.7  --  1.6*   Liver Function Tests: Recent Labs  Lab 01/06/19 1700  AST 23  ALT 20  ALKPHOS 90  BILITOT 0.4  PROT 6.3*  ALBUMIN 3.6   No results for input(s): LIPASE, AMYLASE in the last 168 hours. No results for input(s): AMMONIA in the last 168 hours. CBC: Recent Labs  Lab 01/06/19 1700 01/07/19 0513 01/08/19 0621  WBC 11.6* 7.9 10.0  NEUTROABS 9.0*  --   --   HGB 11.2* 10.6* 8.6*  HCT 37.9 36.7 30.4*  MCV 95.9 96.1 98.1  PLT 171 174 154   Cardiac Enzymes: No results  for input(s): CKTOTAL, CKMB, CKMBINDEX, TROPONINI in the last 168 hours. CBG (last 3)  No results for input(s): GLUCAP in the last 72 hours. Recent Results (from the past 240 hour(s))  Surgical pcr screen     Status: None   Collection Time: 01/07/19 12:50 PM  Result Value Ref Range Status   MRSA, PCR NEGATIVE NEGATIVE Final   Staphylococcus aureus NEGATIVE NEGATIVE Final    Comment: (NOTE) The Xpert SA Assay (FDA approved for NASAL specimens in patients 69 years of age and older), is one component of a comprehensive surveillance program. It is not intended to diagnose infection nor to guide or monitor treatment. Performed at [facility], [address], [place], [place] [address]      Studies: Dg Chest 1 View  Result Date: 01/06/2019 CLINICAL DATA:  78 year old  female status post fall down steps. Pain. EXAM: CHEST  1 VIEW COMPARISON:  Chest radiographs 12/08/2018 and earlier. FINDINGS: AP supine view at 1903 hours. Multilevel previous spinal compression fracture vertebral augmentation. Stable lung volumes and mediastinal contours. Calcified aortic atherosclerosis. Visualized tracheal air column is within normal limits. Allowing for portable technique the lungs are clear. No pneumothorax or pleural effusion. No acute osseous abnormality identified. IMPRESSION: No acute cardiopulmonary abnormality or acute traumatic injury identified. Electronically Signed   By: Genevie Ann M.D.   On: 01/06/2019 19:35   Ct Head Wo Contrast  Result Date: 01/06/2019 CLINICAL DATA:  78 year old female status post fall down stairs. EXAM: CT HEAD WITHOUT CONTRAST TECHNIQUE: Contiguous axial images were obtained from the base of the skull through the vertex without intravenous contrast. COMPARISON:  None. FINDINGS: Brain: Incidental dural calcification. Cerebral volume is within normal limits for age. No midline shift, mass effect, or evidence of intracranial mass lesion. No ventriculomegaly. No acute intracranial hemorrhage identified. No cortically based acute infarct identified. Mild-to-moderate bilateral patchy white matter hypodensity. Vascular: Calcified atherosclerosis at the skull base. No suspicious intracranial vascular hyperdensity. Skull: Intact. Sinuses/Orbits: Tympanic cavities and mastoids are clear. Mild mucosal thickening or trace fluid in the left sphenoid sinus. Other: No scalp hematoma identified. Negative visible orbits. IMPRESSION: 1. No acute intracranial abnormality or acute traumatic injury identified. 2. Mild to moderate cerebral white matter changes, most commonly due to chronic small vessel disease. Electronically Signed   By: Genevie Ann M.D.   On: 01/06/2019 23:17   Dg Hip Operative Unilat With Pelvis Left  Result Date: 01/07/2019 CLINICAL DATA:  Fracture fixation  EXAM: OPERATIVE left HIP (WITH PELVIS IF PERFORMED) 7 VIEWS TECHNIQUE: Fluoroscopic spot image(s) were submitted for interpretation post-operatively. COMPARISON:  01/06/2019 FINDINGS: C-arm images reveal fixation of intertrochanteric fracture on the left with compression screw and intramedullary rod. Satisfactory alignment. IMPRESSION: ORIF left intertrochanteric fracture. Electronically Signed   By: Franchot Gallo M.D.   On: 01/07/2019 15:57   Dg Hip Unilat W Or Wo Pelvis 2-3 Views Left  Result Date: 01/06/2019 CLINICAL DATA:  78 year old female status post fall down steps. Pain. EXAM: DG HIP (WITH OR WITHOUT PELVIS) 2-3V LEFT COMPARISON:  [facility] Lumbar radiographs 04/16/2018. FINDINGS: Comminuted proximal left femur intertrochanteric fracture with anterior displacement of 1/2 shaft with, mild varus impaction, mildly displaced trochanter butterfly fragments. Left femoral head remains normally located. No superimposed acute fracture of the pelvis is identified. Grossly intact proximal right femur. Aortoiliac calcified atherosclerosis. Nonobstructed visible bowel gas pattern. IMPRESSION: Comminuted left femur intertrochanteric fracture with anterior displacement and mild varus impaction. Electronically Signed   By: Herminio Heads.D.  On: 01/06/2019 19:37   Scheduled Meds: . docusate sodium  200 mg Oral BID  . enoxaparin (LOVENOX) injection  30 mg Subcutaneous Q24H  . escitalopram  20 mg Oral QHS  . pantoprazole  40 mg Oral BID AC   Continuous Infusions:  Active Problems:   Displaced intertrochanteric fracture of left femur, initial encounter for closed fracture [facility])  Time spent:   Irwin Brakeman, MD [facility] 01/08/2019, 10:06 AM    LOS: 2 days  How to contact the [facility] Attending or Consulting provider [place] or covering provider during after hours [place], for this patient?  1. Check the care team in [facility] and look for a) attending/consulting TRH provider listed and b)  the [facility] team listed 2. Log into [email or web address] and use [place]'s universal password to access. If you do not have the password, please contact the hospital operator. 3. Locate the [facility] provider you are looking for under [facility] and page to a number that you can be directly reached. 4. If you still have difficulty reaching the provider, please page the [facility] (Director on Call) for the Hospitalists listed on amion for assistance.

## 2019-01-08 NOTE — Plan of Care (Signed)
  Problem: Acute Rehab PT Goals(only PT should resolve) Goal: Pt Will Go Supine/Side To Sit Outcome: Progressing Flowsheets (Taken 01/08/2019 1219) Pt will go Supine/Side to Sit: with moderate assist Goal: Patient Will Transfer Sit To/From Stand Outcome: Progressing Flowsheets (Taken 01/08/2019 1219) Patient will transfer sit to/from stand: with moderate assist Goal: Pt Will Transfer Bed To Chair/Chair To Bed Outcome: Progressing Flowsheets (Taken 01/08/2019 1219) Pt will Transfer Bed to Chair/Chair to Bed: with mod assist Goal: Pt Will Ambulate Outcome: Progressing Flowsheets (Taken 01/08/2019 1219) Pt will Ambulate: 15 feet; with moderate assist; with rolling walker   12:20 PM, 01/08/19 [name], MPT Physical Therapist with [facility] [phone] office [phone] mobile phone

## 2019-01-08 NOTE — Clinical Social Work Note (Signed)
Clinical Social Work Assessment  Patient Details  Name: Misty [name] MRN: [id number] Date of Birth: 26-Apr-1941  Date of referral:                  Reason for consult:  Facility Placement                Permission sought to share information with:    Permission granted to share information::     Name::        Agency::     Relationship::     Contact Information:  spouse was asleep in the room and could not be aroused  Housing/Transportation Living arrangements for the past 2 months:  Single Family Home Source of Information:  Patient Patient Interpreter Needed:  None Criminal Activity/Legal Involvement Pertinent to Current Situation/Hospitalization:  No - Comment as needed Significant Relationships:  Spouse Lives with:  Spouse Do you feel safe going back to the place where you live?  Yes Need for family participation in patient care:  Yes (Comment)  Care giving concerns:  Independent at baseline.    Social Worker assessment / plan:  Patient is independent at baseline, drive short distances. She is agreeable to short term rehab at [facility].  She provided her preferences and referrals were sent.   Employment status:  Retired Health and safety inspector:  Medicare PT Recommendations:    Information / Referral to community resources:  Skilled Nursing Facility  Patient/Family's Response to care:  Patient is agreeable to SNF for short term rehab.  Patient/Family's Understanding of and Emotional Response to Diagnosis, Current Treatment, and Prognosis:  Patient understands her diagnosis, treatment and prognosis and feels she is in need of short term rehab.   Emotional Assessment Appearance:  Appears stated age Attitude/Demeanor/Rapport:  Lethargic Affect (typically observed):  Calm, Accepting Orientation:  Oriented to Self, Oriented to Place, Oriented to  Time, Oriented to Situation Alcohol / Substance use:  Not Applicable Psych involvement (Current and /or in the community):  No  (Comment)  Discharge Needs  Concerns to be addressed:  Discharge Planning Concerns Readmission within the last 30 days:  No Current discharge risk:  None Barriers to Discharge:  No Barriers Identified   Annice Needy, LCSW 01/08/2019, 2:17 PM

## 2019-01-08 NOTE — Progress Notes (Signed)
Patient ID: Misty [name], female   DOB: 16-May-1941, 78 y.o.   MRN: [id number] 01/06/2019 admitted  [place] 2/20  Procedure orif left hip gamma nail   BP 108/75   Pulse 82   Temp 98.7 F (37.1 C)   Resp 16   Ht 5' 7"  (1.702 m)   Wt 59.9 kg   SpO2 98%   BMI 20.68 kg/m   CBC Latest Ref Rng & Units 01/08/2019 01/07/2019 01/06/2019  WBC 4.0 - 10.5 K/uL 10.0 7.9 11.6(H)  Hemoglobin 12.0 - 15.0 g/dL 8.6(L) 10.6(L) 11.2(L)  Hematocrit 36.0 - 46.0 % 30.4(L) 36.7 37.9  Platelets 150 - 400 K/uL 154 174 171    BMP Latest Ref Rng & Units 01/08/2019 01/07/2019 01/06/2019  Glucose 70 - 99 mg/dL 114(H) 142(H) 152(H)  BUN 8 - 23 mg/dL 13 7(L) 8  Creatinine 0.44 - 1.00 mg/dL 0.77 0.61 0.66  BUN/Creat Ratio 12 - 28 - - -  Sodium 135 - 145 mmol/L 142 142 143  Potassium 3.5 - 5.1 mmol/L 3.9 3.2(L) 2.9(L)  Chloride 98 - 111 mmol/L 101 102 104  CO2 22 - 32 mmol/L 34(H) 33(H) 29  Calcium 8.9 - 10.3 mg/dL 8.4(L) 8.1(L) 8.6(L)   Drowsy after dilaudid  Dressing scant drainage with normal limb alignment  Monitor acute blood loss anemia Go to oral pain meds

## 2019-01-09 ENCOUNTER — Encounter (HOSPITAL_COMMUNITY): Payer: Self-pay | Admitting: Family Medicine

## 2019-01-09 LAB — BASIC METABOLIC PANEL WITH GFR
Anion gap: 9 (ref 5–15)
BUN: 15 mg/dL (ref 8–23)
CO2: 31 mmol/L (ref 22–32)
Calcium: 8.3 mg/dL — ABNORMAL LOW (ref 8.9–10.3)
Chloride: 99 mmol/L (ref 98–111)
Creatinine, Ser: 0.74 mg/dL (ref 0.44–1.00)
GFR calc Af Amer: >60 mL/min (ref >60)
GFR calc non Af Amer: >60 mL/min (ref >60)
Glucose, Bld: 98 mg/dL (ref 70–99)
Potassium: 3.8 mmol/L (ref 3.5–5.1)
Sodium: 139 mmol/L (ref 135–145)

## 2019-01-09 LAB — CBC
HCT: 27.2 % — ABNORMAL LOW (ref 36.0–46.0)
Hemoglobin: 8 g/dL — ABNORMAL LOW (ref 12.0–15.0)
MCH: 28.7 pg (ref 26.0–34.0)
MCHC: 29.4 g/dL — ABNORMAL LOW (ref 30.0–36.0)
MCV: 97.5 fL (ref 80.0–100.0)
Platelets: 135 K/uL — ABNORMAL LOW (ref 150–400)
RBC: 2.79 MIL/uL — ABNORMAL LOW (ref 3.87–5.11)
RDW: 16.5 % — ABNORMAL HIGH (ref 11.5–15.5)
WBC: 8.6 K/uL (ref 4.0–10.5)
nRBC: 0 % (ref 0.0–0.2)

## 2019-01-09 LAB — GLUCOSE, CAPILLARY
Glucose-Capillary: 107 mg/dL — ABNORMAL HIGH (ref 70–99)
Glucose-Capillary: 111 mg/dL — ABNORMAL HIGH (ref 70–99)
Glucose-Capillary: 253 mg/dL — ABNORMAL HIGH (ref 70–99)

## 2019-01-09 NOTE — Progress Notes (Signed)
Physical Therapy Treatment Patient Details Name: Misty [name] MRN: [id number] DOB: Jun 26, 1941 Today's Date: 01/09/2019    History of Present Illness Misty [name] is a 78 y.o. female s/p left hip ORIF secondary to fracture 01/07/19 with medical history significant for pulmonary fibrosis, COPD- NOT on home O2., ischemic colitis, who presented to [facility] after a fall, with complaints of left hip pain.    PT Comments    Patient demonstrates slow labored functional mobility for bed mobility, transfers and taking steps with RW requiring frequent verbal/tactile cueing for proper hand placement when scooting up to bedside, sit to stands with fair/poor carryover.  Patient limited to a few steps at bedside to transfer to [facility] and chair due to c/o severe pain LLE when weightbearing and tolerated sitting up in chair with family members present in room - RN notified.  Patient will benefit from continued physical therapy in hospital and recommended venue below to increase strength, balance, endurance for safe ADLs and gait.   Follow Up Recommendations  SNF     Equipment Recommendations  None recommended by PT    Recommendations for Other Services       Precautions / Restrictions Precautions Precautions: Fall Restrictions Weight Bearing Restrictions: Yes LLE Weight Bearing: Weight bearing as tolerated    Mobility  Bed Mobility Overal bed mobility: Needs Assistance Bed Mobility: Supine to Sit     Supine to sit: Mod assist     General bed mobility comments: slow labored movement with frequent verbal/tactile cueing to follow directions  Transfers Overall transfer level: Needs assistance Equipment used: Rolling walker (2 wheeled) Transfers: Sit to/from [employer] Sit to Stand: Mod assist;Max assist Stand pivot transfers: Max assist       General transfer comment: very unsteady on feet with poor tolerance for weightbearing on LLE due to c/o increased  pain  Ambulation/Gait Ambulation/Gait assistance: Max assist Gait Distance (Feet): 4 Feet Assistive device: Rolling walker (2 wheeled) Gait Pattern/deviations: Decreased step length - right;Decreased step length - left;Decreased stance time - left;Decreased stride length Gait velocity: slow   General Gait Details: limited to 5-6 slow labored steps with c/o severe pain when weighbearing on LLE, frequent near falls and much verbal/tactile cueing for safety   Stairs             Wheelchair Mobility    Modified Rankin (Stroke Patients Only)       Balance Overall balance assessment: Needs assistance Sitting-balance support: Feet supported;Bilateral upper extremity supported Sitting balance-Leahy Scale: Fair     Standing balance support: During functional activity;Bilateral upper extremity supported Standing balance-Leahy Scale: Poor Standing balance comment: fair/poor using RW                            Cognition Arousal/Alertness: Awake/alert Behavior During Therapy: WFL for tasks assessed/performed Overall Cognitive Status: Within Functional Limits for tasks assessed                                        Exercises Total Joint Exercises Ankle Circles/Pumps: Supine;AROM;Left;10 reps;Strengthening Short Arc Quad: Supine;AROM;Left;10 reps;Strengthening Heel Slides: Supine;AROM;Left;Strengthening    General Comments        Pertinent Vitals/Pain Pain Assessment: Faces Faces Pain Scale: Hurts whole lot Pain Location: left hip Pain Descriptors / Indicators: Sore;Sharp;Grimacing;Guarding Pain Intervention(s): Limited activity within patient's tolerance;Monitored during session;Patient requesting pain  meds-RN notified    Home Living                      Prior Function            PT Goals (current goals can now be found in the care plan section) Acute Rehab PT Goals Patient Stated Goal: return home PT Goal Formulation: With  patient/family Time For Goal Achievement: 01/22/19 Potential to Achieve Goals: Good Progress towards PT goals: Progressing toward goals    Frequency    Min 5X/week      PT Plan Current plan remains appropriate    Co-evaluation              AM-PAC PT "6 Clicks" Mobility   Outcome Measure  Help needed turning from your back to your side while in a flat bed without using bedrails?: A Lot Help needed moving from lying on your back to sitting on the side of a flat bed without using bedrails?: A Lot Help needed moving to and from a bed to a chair (including a wheelchair)?: A Lot Help needed standing up from a chair using your arms (e.g., wheelchair or bedside chair)?: A Lot Help needed to walk in hospital room?: Total Help needed climbing 3-5 steps with a railing? : Total 6 Click Score: 10    End of Session Equipment Utilized During Treatment: Oxygen Activity Tolerance: Patient tolerated treatment well;Patient limited by fatigue Patient left: in chair;with call bell/phone within reach;with family/visitor present Nurse Communication: Mobility status PT Visit Diagnosis: Unsteadiness on feet (R26.81);Other abnormalities of gait and mobility (R26.89);Muscle weakness (generalized) (M62.81)     Time: 4707-6151 PT Time Calculation (min) (ACUTE ONLY): 25 min  Charges:  $Therapeutic Exercise: 8-22 mins $Therapeutic Activity: 8-22 mins                     1:52 PM, 01/09/19 Misty [name], MPT Physical Therapist with [facility] [phone] office [phone] mobile phone

## 2019-01-09 NOTE — Progress Notes (Signed)
PROGRESS NOTE  Misty [name]  NUU:[id number]  DOB: [birth year]  DOA: 01/06/2019 PCP: Chipper Herb, MD  Brief Admission Hx: 78 y.o. female with medical history significant for pulmonary fibrosis, COPD- NOT on home O2., h/o ischemic colitis, who presented to [facility] after a fall, with complaints of left hip pain.  MDM/Assessment & Plan:   1. Acute left femoral fracture - POD#2 s/p ORIF left hip gamma nail. Post op per ortho.  PT recommending SNF.  Pt agreeable, social worker consulted.  2. Acute blood loss anemia - follow closely and transfuse as needed. Hg 8.  3. Hypokalemia - repleted.  4. Chronic back pain - Pt reports 5 prior back operations and one done in last weeks.  IV pain meds ordered as needed.  5. COPD / pulmonary fibrosis - Pt is on chronic home oxygen. Continue supplemental oxygen.  6. Depression/Anxiety - Pt is on alprazolam as needed for anxiety.  7. Frequent falls -SNF placement pending.    DVT prophylaxis: SCDs Code Status: Full  Family Communication: husband at bedside  Disposition Plan: SNF placement pending.   Consultants:  Orthopedics   Procedures:  ORIF left hip gamma nail  Antimicrobials:  n/a   Subjective: Pt says that pain has been manageable.      Objective: Vitals:   01/08/19 0700 01/08/19 1425 01/08/19 2225 01/09/19 0535  BP: 108/75 (!) 116/59 109/60 135/67  Pulse: 82 92 96 95  Resp: 16 18 16 16   Temp: 98.7 F (37.1 C) 98.4 F (36.9 C) 98.9 F (37.2 C) 98.1 F (36.7 C)  TempSrc:  Oral Oral Oral  SpO2: 98% 95% 97% 96%  Weight:      Height:        Intake/Output Summary (Last 24 hours) at 01/09/2019 1525 Last data filed at 01/08/2019 2000 Gross per 24 hour  Intake -  Output 100 ml  Net -100 ml   Filed Weights   01/06/19 1633 01/07/19 0929  Weight: 59 kg 59.9 kg   REVIEW OF SYSTEMS  As per history otherwise all reviewed and reported negative  Exam:  General exam: awake, alert, NAD.  Respiratory system: wheezing heard.  No increased work of breathing. Cardiovascular system: S1 & S2 heard. No JVD, murmurs, gallops, clicks or pedal edema. Gastrointestinal system: Abdomen is nondistended, soft and nontender. Normal bowel sounds heard. Central nervous system: Alert and oriented. No focal neurological deficits. Extremities:  LLE bandages clean and dry normal limb alignment.   Data Reviewed: Basic Metabolic Panel: Recent Labs  Lab 01/06/19 1700 01/07/19 0513 01/08/19 0621 01/09/19 0618  NA 143 142 142 139  K 2.9* 3.2* 3.9 3.8  CL 104 102 101 99  CO2 29 33* 34* 31  GLUCOSE 152* 142* 114* 98  BUN 8 7* 13 15  CREATININE 0.66 0.61 0.77 0.74  CALCIUM 8.6* 8.1* 8.4* 8.3*  MG 1.7  --  1.6*  --    Liver Function Tests: Recent Labs  Lab 01/06/19 1700  AST 23  ALT 20  ALKPHOS 90  BILITOT 0.4  PROT 6.3*  ALBUMIN 3.6   No results for input(s): LIPASE, AMYLASE in the last 168 hours. No results for input(s): AMMONIA in the last 168 hours. CBC: Recent Labs  Lab 01/06/19 1700 01/07/19 0513 01/08/19 0621 01/09/19 0618  WBC 11.6* 7.9 10.0 8.6  NEUTROABS 9.0*  --   --   --   HGB 11.2* 10.6* 8.6* 8.0*  HCT 37.9 36.7 30.4* 27.2*  MCV 95.9 96.1 98.1  97.5  PLT 171 174 154 135*   Cardiac Enzymes: No results for input(s): CKTOTAL, CKMB, CKMBINDEX, TROPONINI in the last 168 hours. CBG (last 3)  Recent Labs    01/09/19 0745 01/09/19 1155  GLUCAP 107* 111*   Recent Results (from the past 240 hour(s))  Surgical pcr screen     Status: None   Collection Time: 01/07/19 12:50 PM  Result Value Ref Range Status   MRSA, PCR NEGATIVE NEGATIVE Final   Staphylococcus aureus NEGATIVE NEGATIVE Final    Comment: (NOTE) The Xpert SA Assay (FDA approved for NASAL specimens in patients 66 years of age and older), is one component of a comprehensive surveillance program. It is not intended to diagnose infection nor to guide or monitor treatment. Performed at [facility], [address], [place], [place]  [address]     Studies: No results found. Scheduled Meds: . docusate sodium  200 mg Oral BID  . enoxaparin (LOVENOX) injection  30 mg Subcutaneous Q24H  . escitalopram  20 mg Oral QHS  . oseltamivir  30 mg Oral Daily  . pantoprazole  40 mg Oral BID AC   Continuous Infusions:  Active Problems:   Displaced intertrochanteric fracture of left femur, initial encounter for closed fracture [facility])  Time spent:   Irwin Brakeman, MD [facility] 01/09/2019, 3:25 PM    LOS: 3 days  How to contact the [facility] Attending or Consulting provider [place] or covering provider during after hours [place], for this patient?  1. Check the care team in [facility] and look for a) attending/consulting TRH provider listed and b) the [facility] team listed 2. Log into [email or web address] and use [place]'s universal password to access. If you do not have the password, please contact the hospital operator. 3. Locate the [facility] provider you are looking for under [facility] and page to a number that you can be directly reached. 4. If you still have difficulty reaching the provider, please page the [facility] (Director on Call) for the Hospitalists listed on amion for assistance.

## 2019-01-09 NOTE — Progress Notes (Signed)
   01/09/19 2220 01/09/19 2240 01/09/19 2243  Oxygen Therapy  SpO2 95 % (!) 85 % 91 %  O2 Device Nasal Cannula Room Air Nasal Cannula  O2 Flow Rate (L/min) 3 L/min  --  2 L/min    01/09/19 2245  Oxygen Therapy  SpO2 95 %  O2 Device Nasal Cannula  O2 Flow Rate (L/min) 3 L/min    Attempted to wean O2 with pt sats dropping to 85 % with aggressive pulmonary toilet and no change in sats.  Pt given 2 L via nasal cannula with sats 91%.  Pt returned to 3L nasal cannula with sats returned to 95%.  Pt is able to pull 750 ml with incentive spirometer.

## 2019-01-10 ENCOUNTER — Encounter (HOSPITAL_COMMUNITY): Payer: Self-pay | Admitting: Family Medicine

## 2019-01-10 DIAGNOSIS — D62 Acute posthemorrhagic anemia: Secondary | ICD-10-CM

## 2019-01-10 DIAGNOSIS — E876 Hypokalemia: Secondary | ICD-10-CM

## 2019-01-10 HISTORY — DX: Hypokalemia: E87.6

## 2019-01-10 LAB — CBC
HCT: 27.5 % — ABNORMAL LOW (ref 36.0–46.0)
Hemoglobin: 8 g/dL — ABNORMAL LOW (ref 12.0–15.0)
MCH: 28.5 pg (ref 26.0–34.0)
MCHC: 29.1 g/dL — ABNORMAL LOW (ref 30.0–36.0)
MCV: 97.9 fL (ref 80.0–100.0)
Platelets: 141 10*3/uL — ABNORMAL LOW (ref 150–400)
RBC: 2.81 MIL/uL — ABNORMAL LOW (ref 3.87–5.11)
RDW: 16.4 % — ABNORMAL HIGH (ref 11.5–15.5)
WBC: 8.1 10*3/uL (ref 4.0–10.5)
nRBC: 0 % (ref 0.0–0.2)

## 2019-01-10 LAB — TYPE AND SCREEN
ABO/RH(D): O POS
Antibody Screen: NEGATIVE
Unit division: 0
Unit division: 0

## 2019-01-10 LAB — BPAM RBC
Blood Product Expiration Date: 202003182359
Blood Product Expiration Date: 202003182359
Unit Type and Rh: 5100
Unit Type and Rh: 5100

## 2019-01-10 MED ORDER — HYDROMORPHONE HCL 1 MG/ML IJ SOLN
0.5000 mg | INTRAMUSCULAR | Status: DC | PRN
  Administered 2019-01-10: 0.5 mg via INTRAVENOUS
  Filled 2019-01-10: qty 0.5

## 2019-01-10 MED ORDER — HALOPERIDOL LACTATE 5 MG/ML IJ SOLN
5.0000 mg | Freq: Once | INTRAMUSCULAR | Status: AC
  Administered 2019-01-10: 5 mg via INTRAVENOUS
  Filled 2019-01-10: qty 1

## 2019-01-10 NOTE — Progress Notes (Signed)
PROGRESS NOTE  Misty [name]  GYB:[id number]  DOB: [birth year]  DOA: 01/06/2019 PCP: Chipper Herb, MD  Brief Admission Hx: 78 y.o. female with medical history significant for pulmonary fibrosis, COPD- NOT on home O2., h/o ischemic colitis, who presented to [facility] after a fall, with complaints of left hip pain.  MDM/Assessment & Plan:   1. Acute left femoral fracture - POD#3 s/p ORIF left hip gamma nail. Post op per ortho.  PT recommending SNF.  Pt agreeable, social worker consulted.  2. Acute blood loss anemia - follow closely and transfuse as needed. Hg stable at 8.  3. Hypokalemia - repleted.  4. Chronic back pain - Pt reports 5 prior back operations and one done in last weeks.  IV pain meds ordered as needed.  5. COPD / pulmonary fibrosis - Pt is on chronic home oxygen. Continue supplemental oxygen.  6. Depression/Anxiety - Pt is on alprazolam as needed for anxiety.  7. Frequent falls -SNF placement pending.    DVT prophylaxis: SCDs Code Status: Full  Family Communication: husband at bedside  Disposition Plan: SNF placement pending.   Consultants:  Orthopedics   Procedures:  ORIF left hip gamma nail  Antimicrobials:  n/a   Subjective: Pt upset that she needs to go to rehab but still agreeable at this time.     Objective: Vitals:   01/09/19 2240 01/09/19 2243 01/09/19 2245 01/10/19 0507  BP:    (!) 133/59  Pulse:    72  Resp:    (!) 24  Temp:    98.3 F (36.8 C)  TempSrc:    Oral  SpO2: (!) 85% 91% 95% 97%  Weight:      Height:        Intake/Output Summary (Last 24 hours) at 01/10/2019 1229 Last data filed at 01/10/2019 0900 Gross per 24 hour  Intake 480 ml  Output 200 ml  Net 280 ml   Filed Weights   01/06/19 1633 01/07/19 0929  Weight: 59 kg 59.9 kg   REVIEW OF SYSTEMS  As per history otherwise all reviewed and reported negative  Exam:  General exam: awake, alert, NAD.  Respiratory system: wheezing heard. No increased work of  breathing. Cardiovascular system: S1 & S2 heard. No JVD, murmurs, gallops, clicks or pedal edema. Gastrointestinal system: Abdomen is nondistended, soft and nontender. Normal bowel sounds heard. Central nervous system: Alert and oriented. No focal neurological deficits. Extremities:  LLE bandages clean and dry normal limb alignment.   Data Reviewed: Basic Metabolic Panel: Recent Labs  Lab 01/06/19 1700 01/07/19 0513 01/08/19 0621 01/09/19 0618  NA 143 142 142 139  K 2.9* 3.2* 3.9 3.8  CL 104 102 101 99  CO2 29 33* 34* 31  GLUCOSE 152* 142* 114* 98  BUN 8 7* 13 15  CREATININE 0.66 0.61 0.77 0.74  CALCIUM 8.6* 8.1* 8.4* 8.3*  MG 1.7  --  1.6*  --    Liver Function Tests: Recent Labs  Lab 01/06/19 1700  AST 23  ALT 20  ALKPHOS 90  BILITOT 0.4  PROT 6.3*  ALBUMIN 3.6   No results for input(s): LIPASE, AMYLASE in the last 168 hours. No results for input(s): AMMONIA in the last 168 hours. CBC: Recent Labs  Lab 01/06/19 1700 01/07/19 0513 01/08/19 0621 01/09/19 0618 01/10/19 0604  WBC 11.6* 7.9 10.0 8.6 8.1  NEUTROABS 9.0*  --   --   --   --   HGB 11.2* 10.6* 8.6* 8.0* 8.0*  HCT 37.9 36.7 30.4* 27.2* 27.5*  MCV 95.9 96.1 98.1 97.5 97.9  PLT 171 174 154 135* 141*   Cardiac Enzymes: No results for input(s): CKTOTAL, CKMB, CKMBINDEX, TROPONINI in the last 168 hours. CBG (last 3)  Recent Labs    01/09/19 0745 01/09/19 1155 01/09/19 1627  GLUCAP 107* 111* 253*   Recent Results (from the past 240 hour(s))  Surgical pcr screen     Status: None   Collection Time: 01/07/19 12:50 PM  Result Value Ref Range Status   MRSA, PCR NEGATIVE NEGATIVE Final   Staphylococcus aureus NEGATIVE NEGATIVE Final    Comment: (NOTE) The Xpert SA Assay (FDA approved for NASAL specimens in patients 27 years of age and older), is one component of a comprehensive surveillance program. It is not intended to diagnose infection nor to guide or monitor treatment. Performed at [facility], [address], [place], [place] [address]     Studies: No results found. Scheduled Meds: . docusate sodium  200 mg Oral BID  . enoxaparin (LOVENOX) injection  30 mg Subcutaneous Q24H  . escitalopram  20 mg Oral QHS  . oseltamivir  30 mg Oral Daily  . pantoprazole  40 mg Oral BID AC   Continuous Infusions:  Active Problems:   Displaced intertrochanteric fracture of left femur, initial encounter for closed fracture [facility])  Time spent:   Irwin Brakeman, MD [facility] 01/10/2019, 12:29 PM    LOS: 4 days  How to contact the [facility] Attending or Consulting provider [place] or covering provider during after hours [place], for this patient?  1. Check the care team in [facility] and look for a) attending/consulting TRH provider listed and b) the [facility] team listed 2. Log into [email or web address] and use [place]'s universal password to access. If you do not have the password, please contact the hospital operator. 3. Locate the [facility] provider you are looking for under [facility] and page to a number that you can be directly reached. 4. If you still have difficulty reaching the provider, please page the [facility] (Director on Call) for the Hospitalists listed on amion for assistance.

## 2019-01-10 NOTE — Progress Notes (Signed)
Patient is very confjused and agitated. Convinced that staff is hiding family from her and "why is everyone in my house". PRN ineffective. Redirection and reorientation ineffective.

## 2019-01-10 NOTE — Progress Notes (Signed)
Physical Therapy Treatment Patient Details Name: Misty [name] MRN: [id number] DOB: 05-26-1941 Today's Date: 01/10/2019    History of Present Illness Misty [name] is a 78 y.o. female s/p left hip ORIF secondary to fracture 01/07/19 with medical history significant for pulmonary fibrosis, COPD- NOT on home O2., ischemic colitis, who presented to [facility] ED after a fall, with complaints of left hip pain.    PT Comments    Patient demonstrates improvement for using BUE for sitting up/scooting to bedside witth less verbal/tactile cueing, c/o severe pain LLE when weightbearing, but demonstrates increased tolerance for taking steps after verbal cues to put more weight through arms when taking steps with RLE.  Patient tolerated sitting up in chair with BLE elevated after therapy with her spouse present in room.  Patient will benefit from continued physical therapy in hospital and recommended venue below to increase strength, balance, endurance for safe ADLs and gait.    Follow Up Recommendations  SNF     Equipment Recommendations  None recommended by PT    Recommendations for Other Services       Precautions / Restrictions Precautions Precautions: Fall Restrictions Weight Bearing Restrictions: Yes LLE Weight Bearing: Weight bearing as tolerated    Mobility  Bed Mobility Overal bed mobility: Needs Assistance Bed Mobility: Supine to Sit     Supine to sit: Mod assist     General bed mobility comments: improvement for using BUE to help scoot self forward  Transfers Overall transfer level: Needs assistance Equipment used: Rolling walker (2 wheeled) Transfers: Sit to/from [employer] Sit to Stand: Mod assist Stand pivot transfers: Mod assist;Max assist       General transfer comment: slight improvement for tolerating weightbearing on LLE using RW  Ambulation/Gait Ambulation/Gait assistance: Mod assist;Max assist Gait Distance (Feet): 6 Feet Assistive  device: Rolling walker (2 wheeled) Gait Pattern/deviations: Decreased step length - right;Decreased step length - left;Decreased stance time - left;Decreased stride length Gait velocity: slow   General Gait Details: tolerated up to 8-10 steps at bedside, side stepping, forward/backwards with slightly improvement for bearing body weight on hands when taking steps with RLE   Stairs             Wheelchair Mobility    Modified Rankin (Stroke Patients Only)       Balance Overall balance assessment: Needs assistance Sitting-balance support: Feet supported;Bilateral upper extremity supported Sitting balance-Leahy Scale: Fair Sitting balance - Comments: occasional leaning to the right without loss of balance   Standing balance support: Bilateral upper extremity supported;During functional activity Standing balance-Leahy Scale: Poor Standing balance comment: fair/poor using RW                            Cognition Arousal/Alertness: Awake/alert Behavior During Therapy: WFL for tasks assessed/performed Overall Cognitive Status: Within Functional Limits for tasks assessed                                        Exercises Total Joint Exercises Ankle Circles/Pumps: Supine;AROM;10 reps;Strengthening;Both Short Arc Quad: Supine;AROM;Left;10 reps;Strengthening Heel Slides: Supine;AROM;Left;Strengthening General Exercises - Lower Extremity Toe Raises: Seated;Strengthening;AROM;Both;10 reps Heel Raises: Seated;AROM;Strengthening;Both;10 reps    General Comments        Pertinent Vitals/Pain Pain Assessment: Faces Faces Pain Scale: Hurts even more Pain Location: left hip with movement Pain Descriptors / Indicators: Sore;Sharp;Grimacing;Guarding Pain Intervention(s):  Limited activity within patient's tolerance;Monitored during session;Premedicated before session    Home Living                      Prior Function            PT Goals  (current goals can now be found in the care plan section) Acute Rehab PT Goals Patient Stated Goal: return home PT Goal Formulation: With patient/family Time For Goal Achievement: 01/22/19 Potential to Achieve Goals: Good Progress towards PT goals: Progressing toward goals    Frequency    Min 5X/week      PT Plan Current plan remains appropriate    Co-evaluation              AM-PAC PT "6 Clicks" Mobility   Outcome Measure    Help needed moving from lying on your back to sitting on the side of a flat bed without using bedrails?: A Lot Help needed moving to and from a bed to a chair (including a wheelchair)?: A Lot Help needed standing up from a chair using your arms (e.g., wheelchair or bedside chair)?: A Lot Help needed to walk in hospital room?: A Lot Help needed climbing 3-5 steps with a railing? : Total 6 Click Score: 9    End of Session Equipment Utilized During Treatment: Oxygen Activity Tolerance: Patient tolerated treatment well;Patient limited by fatigue;Patient limited by pain Patient left: in chair;with call bell/phone within reach;with family/visitor present;with chair alarm set Nurse Communication: Mobility status PT Visit Diagnosis: Unsteadiness on feet (R26.81);Other abnormalities of gait and mobility (R26.89);Muscle weakness (generalized) (M62.81)     Time: 0174-9449 PT Time Calculation (min) (ACUTE ONLY): 24 min  Charges:  $Therapeutic Exercise: 8-22 mins $Therapeutic Activity: 8-22 mins                     12:32 PM, 01/10/19 [name], MPT Physical Therapist with [facility] 336 [phone] office [phone] mobile phone

## 2019-01-11 DIAGNOSIS — F17200 Nicotine dependence, unspecified, uncomplicated: Secondary | ICD-10-CM

## 2019-01-11 LAB — CBC
HEMATOCRIT: 24.9 % — AB (ref 36.0–46.0)
Hemoglobin: 7.3 g/dL — ABNORMAL LOW (ref 12.0–15.0)
MCH: 28.2 pg (ref 26.0–34.0)
MCHC: 29.3 g/dL — ABNORMAL LOW (ref 30.0–36.0)
MCV: 96.1 fL (ref 80.0–100.0)
Platelets: 178 10*3/uL (ref 150–400)
RBC: 2.59 MIL/uL — ABNORMAL LOW (ref 3.87–5.11)
RDW: 16.1 % — ABNORMAL HIGH (ref 11.5–15.5)
WBC: 6.2 10*3/uL (ref 4.0–10.5)
nRBC: 0 % (ref 0.0–0.2)

## 2019-01-11 LAB — PREPARE RBC (CROSSMATCH)

## 2019-01-11 MED ORDER — FERROUS SULFATE 325 (65 FE) MG PO TABS
325.0000 mg | ORAL_TABLET | Freq: Every day | ORAL | Status: DC
  Administered 2019-01-12: 325 mg via ORAL
  Filled 2019-01-11: qty 1

## 2019-01-11 MED ORDER — LORAZEPAM 2 MG/ML IJ SOLN
0.7500 mg | Freq: Once | INTRAMUSCULAR | Status: AC
  Administered 2019-01-11: 0.75 mg via INTRAVENOUS
  Filled 2019-01-11: qty 1

## 2019-01-11 MED ORDER — SODIUM CHLORIDE 0.9% IV SOLUTION: Freq: Once | INTRAVENOUS | Status: DC

## 2019-01-11 NOTE — Progress Notes (Signed)
PROGRESS NOTE  Misty [name]  IWL:[id number]  DOB: [birth year]  DOA: 01/06/2019 PCP: Chipper Herb, MD  Brief Admission Hx: 78 y.o. female with medical history significant for pulmonary fibrosis, COPD- NOT on home O2., h/o ischemic colitis, who presented to [facility] after a fall, with complaints of left hip pain.  MDM/Assessment & Plan:   1. Acute left femoral fracture - POD#4 s/p ORIF left hip gamma nail. Post op per ortho.  PT recommending SNF.  Pt agreeable, social worker consulted.  Working on SNF placement.  2. Acute blood loss anemia - follow closely and transfuse as needed. Hg down to 7.3. Transfuse 1 unit PRBC.  3. Hypokalemia - repleted.  4. Chronic back pain - Pt reports 5 prior back operations and one done in last weeks.  IV pain meds ordered as needed.  5. COPD / pulmonary fibrosis - Pt is on chronic home oxygen. Continue supplemental oxygen.  6. Depression/Anxiety - Pt is on alprazolam as needed for anxiety.  7. Frequent falls -SNF placement pending.    DVT prophylaxis: SCDs Code Status: Full  Family Communication: husband at bedside  Disposition Plan: SNF placement pending.   Consultants:  Orthopedics   Procedures:  ORIF left hip gamma nail  Antimicrobials:  n/a   Subjective: Pt confused at times, but ate some breakfast.  Says that pain is controlled.    Objective: Vitals:   01/10/19 0507 01/10/19 1319 01/10/19 2146 01/11/19 0502  BP: (!) 133/59 (!) 103/59 126/66 (!) 155/59  Pulse: 72 81 97 99  Resp: (!) 24 16 16 18   Temp: 98.3 F (36.8 C) 98 F (36.7 C) 98.1 F (36.7 C) 98.2 F (36.8 C)  TempSrc: Oral Oral Oral Oral  SpO2: 97% 98% 93% 96%  Weight:      Height:        Intake/Output Summary (Last 24 hours) at 01/11/2019 1251 Last data filed at 01/11/2019 0900 Gross per 24 hour  Intake 360 ml  Output 400 ml  Net -40 ml   Filed Weights   01/06/19 1633 01/07/19 0929  Weight: 59 kg 59.9 kg   REVIEW OF SYSTEMS  As per history otherwise all  reviewed and reported negative  Exam:  General exam: awake, alert, NAD.  Respiratory system: wheezing heard. No increased work of breathing. Cardiovascular system: S1 & S2 heard. No JVD, murmurs, gallops, clicks or pedal edema. Gastrointestinal system: Abdomen is nondistended, soft and nontender. Normal bowel sounds heard. Central nervous system: Alert and oriented. No focal neurological deficits. Extremities:  LLE bandages clean and dry normal limb alignment.   Data Reviewed: Basic Metabolic Panel: Recent Labs  Lab 01/06/19 1700 01/07/19 0513 01/08/19 0621 01/09/19 0618  NA 143 142 142 139  K 2.9* 3.2* 3.9 3.8  CL 104 102 101 99  CO2 29 33* 34* 31  GLUCOSE 152* 142* 114* 98  BUN 8 7* 13 15  CREATININE 0.66 0.61 0.77 0.74  CALCIUM 8.6* 8.1* 8.4* 8.3*  MG 1.7  --  1.6*  --    Liver Function Tests: Recent Labs  Lab 01/06/19 1700  AST 23  ALT 20  ALKPHOS 90  BILITOT 0.4  PROT 6.3*  ALBUMIN 3.6   No results for input(s): LIPASE, AMYLASE in the last 168 hours. No results for input(s): AMMONIA in the last 168 hours. CBC: Recent Labs  Lab 01/06/19 1700 01/07/19 0513 01/08/19 0621 01/09/19 0618 01/10/19 0604 01/11/19 0930  WBC 11.6* 7.9 10.0 8.6 8.1 6.2  NEUTROABS 9.0*  --   --   --   --   --  HGB 11.2* 10.6* 8.6* 8.0* 8.0* 7.3*  HCT 37.9 36.7 30.4* 27.2* 27.5* 24.9*  MCV 95.9 96.1 98.1 97.5 97.9 96.1  PLT 171 174 154 135* 141* 178   Cardiac Enzymes: No results for input(s): CKTOTAL, CKMB, CKMBINDEX, TROPONINI in the last 168 hours. CBG (last 3)  Recent Labs    01/09/19 0745 01/09/19 1155 01/09/19 1627  GLUCAP 107* 111* 253*   Recent Results (from the past 240 hour(s))  Surgical pcr screen     Status: None   Collection Time: 01/07/19 12:50 PM  Result Value Ref Range Status   MRSA, PCR NEGATIVE NEGATIVE Final   Staphylococcus aureus NEGATIVE NEGATIVE Final    Comment: (NOTE) The Xpert SA Assay (FDA approved for NASAL specimens in patients 13 years  of age and older), is one component of a comprehensive surveillance program. It is not intended to diagnose infection nor to guide or monitor treatment. Performed at [facility], [address], [place], [place] [address]     Studies: No results found. Scheduled Meds: . sodium chloride   Intravenous Once  . docusate sodium  200 mg Oral BID  . enoxaparin (LOVENOX) injection  30 mg Subcutaneous Q24H  . escitalopram  20 mg Oral QHS  . oseltamivir  30 mg Oral Daily  . pantoprazole  40 mg Oral BID AC   Continuous Infusions:  Active Problems:   GERD (gastroesophageal reflux disease)   Generalized weakness   Tobacco use disorder   Displaced intertrochanteric fracture of left femur, initial encounter for closed fracture ([place])   Hypokalemia  Time spent:   Irwin Brakeman, MD [facility] 01/11/2019, 12:51 PM    LOS: 5 days  How to contact the [facility] Attending or Consulting provider [place] or covering provider during after hours [place], for this patient?  1. Check the care team in [facility] and look for a) attending/consulting TRH provider listed and b) the [facility] team listed 2. Log into [email or web address] and use [place]'s universal password to access. If you do not have the password, please contact the hospital operator. 3. Locate the [facility] provider you are looking for under [facility] and page to a number that you can be directly reached. 4. If you still have difficulty reaching the provider, please page the [facility] (Director on Call) for the Hospitalists listed on amion for assistance.

## 2019-01-11 NOTE — Progress Notes (Signed)
Physical Therapy Treatment Patient Details Name: Misty [name] MRN: [id number] DOB: 10-17-1941 Today's Date: 01/11/2019    History of Present Illness Misty [name] is a 78 y.o. female s/p left hip ORIF secondary to fracture 01/07/19 with medical history significant for pulmonary fibrosis, COPD- NOT on home O2., ischemic colitis, who presented to [facility] ED after a fall, with complaints of left hip pain.    PT Comments    Patient presents slightly anxious want to know where her spouse is and cooperative with therapy.  Patient demonstrates improvement for taking LLE steps with less shuffling of feet, requires verbal cues to put body weight through arms when taking steps with RLE with some improvement in carryover and tolerated sitting up in  chair after therapy - nursing staff notified.  Patient will benefit from continued physical therapy in hospital and recommended venue below to increase strength, balance, endurance for safe ADLs and gait.   Follow Up Recommendations  SNF     Equipment Recommendations  None recommended by PT    Recommendations for Other Services       Precautions / Restrictions Precautions Precautions: Fall Restrictions Weight Bearing Restrictions: Yes LLE Weight Bearing: Weight bearing as tolerated    Mobility  Bed Mobility Overal bed mobility: Needs Assistance Bed Mobility: Supine to Sit     Supine to sit: Mod assist     General bed mobility comments: slow labored movement  Transfers Overall transfer level: Needs assistance Equipment used: Rolling walker (2 wheeled) Transfers: Sit to/from [employer] Sit to Stand: Mod assist Stand pivot transfers: Mod assist       General transfer comment: improvement for limiting body weight on LLE without losing balance  Ambulation/Gait Ambulation/Gait assistance: Mod assist;Max assist Gait Distance (Feet): 5 Feet Assistive device: Rolling walker (2 wheeled) Gait Pattern/deviations:  Decreased step length - right;Decreased step length - left;Decreased stance time - left;Decreased stride length Gait velocity: slow   General Gait Details: able to take a few forward steps leading with LLE requiring less assistance for maintaining standing balance   Stairs             Wheelchair Mobility    Modified Rankin (Stroke Patients Only)       Balance Overall balance assessment: Needs assistance Sitting-balance support: Feet supported;Bilateral upper extremity supported Sitting balance-Leahy Scale: Fair Sitting balance - Comments: frequently looks down while seated at bedside   Standing balance support: Bilateral upper extremity supported;During functional activity Standing balance-Leahy Scale: Poor Standing balance comment: fair/poor using RW                            Cognition Arousal/Alertness: Awake/alert Behavior During Therapy: WFL for tasks assessed/performed Overall Cognitive Status: Impaired/Different from baseline Area of Impairment: Orientation;Safety/judgement;Awareness                 Orientation Level: Place       Safety/Judgement: Decreased awareness of safety            Exercises Total Joint Exercises Ankle Circles/Pumps: Supine;AROM;10 reps;Strengthening;Both Short Arc Quad: Supine;AROM;Left;10 reps;Strengthening Heel Slides: Supine;AROM;Left;Strengthening;10 reps    General Comments        Pertinent Vitals/Pain Pain Assessment: Faces Faces Pain Scale: Hurts even more Pain Location: left hip with movement/weightbearing Pain Descriptors / Indicators: Sore;Sharp;Grimacing;Guarding Pain Intervention(s): Limited activity within patient's tolerance;Monitored during session    Home Living  Prior Function            PT Goals (current goals can now be found in the care plan section) Acute Rehab PT Goals Patient Stated Goal: return home PT Goal Formulation: With  patient/family Time For Goal Achievement: 01/22/19 Progress towards PT goals: Progressing toward goals    Frequency    Min 5X/week      PT Plan Current plan remains appropriate    Co-evaluation              AM-PAC PT "6 Clicks" Mobility   Outcome Measure  Help needed turning from your back to your side while in a flat bed without using bedrails?: A Lot Help needed moving from lying on your back to sitting on the side of a flat bed without using bedrails?: A Lot Help needed moving to and from a bed to a chair (including a wheelchair)?: A Lot Help needed standing up from a chair using your arms (e.g., wheelchair or bedside chair)?: A Lot Help needed to walk in hospital room?: A Lot Help needed climbing 3-5 steps with a railing? : Total 6 Click Score: 11    End of Session Equipment Utilized During Treatment: Oxygen Activity Tolerance: Patient tolerated treatment well;Patient limited by fatigue;Patient limited by pain Patient left: in chair;with call bell/phone within reach;with chair alarm set Nurse Communication: Mobility status PT Visit Diagnosis: Unsteadiness on feet (R26.81);Other abnormalities of gait and mobility (R26.89);Muscle weakness (generalized) (M62.81)     Time: 3007-6226 PT Time Calculation (min) (ACUTE ONLY): 28 min  Charges:  $Therapeutic Exercise: 8-22 mins $Therapeutic Activity: 8-22 mins                     2:13 PM, 01/11/19 [name], MPT Physical Therapist with [facility] 336 [phone] office [phone] mobile phone

## 2019-01-11 NOTE — Care Management Important Message (Signed)
Important Message  Patient Details  Name: Misty [name] MRN: [id number] Date of Birth: 1941/07/11   Medicare Important Message Given:  Yes    Corey Harold 01/11/2019, 1:53 PM

## 2019-01-12 ENCOUNTER — Inpatient Hospital Stay
Admission: RE | Admit: 2019-01-12 | Discharge: 2019-01-28 | Disposition: A | Discharge status: Home / Self Care | Payer: Medicare Other | Source: Ambulatory Visit | Attending: Internal Medicine | Admitting: Internal Medicine

## 2019-01-12 DIAGNOSIS — R350 Frequency of micturition: Secondary | ICD-10-CM | POA: Diagnosis not present

## 2019-01-12 DIAGNOSIS — E785 Hyperlipidemia, unspecified: Secondary | ICD-10-CM | POA: Diagnosis not present

## 2019-01-12 DIAGNOSIS — E876 Hypokalemia: Secondary | ICD-10-CM | POA: Diagnosis not present

## 2019-01-12 DIAGNOSIS — M6281 Muscle weakness (generalized): Secondary | ICD-10-CM | POA: Diagnosis not present

## 2019-01-12 DIAGNOSIS — Z4789 Encounter for other orthopedic aftercare: Secondary | ICD-10-CM | POA: Diagnosis not present

## 2019-01-12 DIAGNOSIS — F329 Major depressive disorder, single episode, unspecified: Secondary | ICD-10-CM | POA: Diagnosis not present

## 2019-01-12 DIAGNOSIS — F17200 Nicotine dependence, unspecified, uncomplicated: Secondary | ICD-10-CM | POA: Diagnosis not present

## 2019-01-12 DIAGNOSIS — T402X5A Adverse effect of other opioids, initial encounter: Secondary | ICD-10-CM | POA: Diagnosis not present

## 2019-01-12 DIAGNOSIS — G4700 Insomnia, unspecified: Secondary | ICD-10-CM | POA: Diagnosis not present

## 2019-01-12 DIAGNOSIS — S72141D Displaced intertrochanteric fracture of right femur, subsequent encounter for closed fracture with routine healing: Secondary | ICD-10-CM | POA: Diagnosis not present

## 2019-01-12 DIAGNOSIS — R200 Anesthesia of skin: Secondary | ICD-10-CM | POA: Diagnosis not present

## 2019-01-12 DIAGNOSIS — M47899 Other spondylosis, site unspecified: Secondary | ICD-10-CM | POA: Diagnosis not present

## 2019-01-12 DIAGNOSIS — J8410 Pulmonary fibrosis, unspecified: Secondary | ICD-10-CM | POA: Diagnosis not present

## 2019-01-12 DIAGNOSIS — J449 Chronic obstructive pulmonary disease, unspecified: Secondary | ICD-10-CM | POA: Diagnosis not present

## 2019-01-12 DIAGNOSIS — S72002A Fracture of unspecified part of neck of left femur, initial encounter for closed fracture: Secondary | ICD-10-CM

## 2019-01-12 DIAGNOSIS — R531 Weakness: Secondary | ICD-10-CM

## 2019-01-12 DIAGNOSIS — S72142S Displaced intertrochanteric fracture of left femur, sequela: Secondary | ICD-10-CM | POA: Diagnosis not present

## 2019-01-12 DIAGNOSIS — Z741 Need for assistance with personal care: Secondary | ICD-10-CM | POA: Diagnosis not present

## 2019-01-12 DIAGNOSIS — R351 Nocturia: Secondary | ICD-10-CM | POA: Diagnosis not present

## 2019-01-12 DIAGNOSIS — F411 Generalized anxiety disorder: Secondary | ICD-10-CM | POA: Diagnosis not present

## 2019-01-12 DIAGNOSIS — F419 Anxiety disorder, unspecified: Secondary | ICD-10-CM | POA: Diagnosis not present

## 2019-01-12 DIAGNOSIS — M4854XS Collapsed vertebra, not elsewhere classified, thoracic region, sequela of fracture: Secondary | ICD-10-CM | POA: Diagnosis not present

## 2019-01-12 DIAGNOSIS — K219 Gastro-esophageal reflux disease without esophagitis: Secondary | ICD-10-CM

## 2019-01-12 DIAGNOSIS — Z9181 History of falling: Secondary | ICD-10-CM | POA: Diagnosis not present

## 2019-01-12 DIAGNOSIS — Z9889 Other specified postprocedural states: Secondary | ICD-10-CM | POA: Diagnosis not present

## 2019-01-12 DIAGNOSIS — K5903 Drug induced constipation: Secondary | ICD-10-CM | POA: Diagnosis not present

## 2019-01-12 DIAGNOSIS — R3915 Urgency of urination: Secondary | ICD-10-CM | POA: Diagnosis not present

## 2019-01-12 DIAGNOSIS — K559 Vascular disorder of intestine, unspecified: Secondary | ICD-10-CM | POA: Diagnosis not present

## 2019-01-12 DIAGNOSIS — M549 Dorsalgia, unspecified: Secondary | ICD-10-CM | POA: Diagnosis not present

## 2019-01-12 DIAGNOSIS — R262 Difficulty in walking, not elsewhere classified: Secondary | ICD-10-CM | POA: Diagnosis not present

## 2019-01-12 DIAGNOSIS — G43909 Migraine, unspecified, not intractable, without status migrainosus: Secondary | ICD-10-CM | POA: Diagnosis not present

## 2019-01-12 DIAGNOSIS — M810 Age-related osteoporosis without current pathological fracture: Secondary | ICD-10-CM | POA: Diagnosis not present

## 2019-01-12 DIAGNOSIS — S72142D Displaced intertrochanteric fracture of left femur, subsequent encounter for closed fracture with routine healing: Secondary | ICD-10-CM | POA: Diagnosis not present

## 2019-01-12 DIAGNOSIS — Z8781 Personal history of (healed) traumatic fracture: Secondary | ICD-10-CM | POA: Diagnosis not present

## 2019-01-12 DIAGNOSIS — D62 Acute posthemorrhagic anemia: Secondary | ICD-10-CM | POA: Diagnosis not present

## 2019-01-12 DIAGNOSIS — K5790 Diverticulosis of intestine, part unspecified, without perforation or abscess without bleeding: Secondary | ICD-10-CM | POA: Diagnosis not present

## 2019-01-12 DIAGNOSIS — D509 Iron deficiency anemia, unspecified: Secondary | ICD-10-CM | POA: Diagnosis not present

## 2019-01-12 LAB — BPAM RBC
Blood Product Expiration Date: 202003182359
ISSUE DATE / TIME: 202002241750
Unit Type and Rh: 5100

## 2019-01-12 LAB — TYPE AND SCREEN
ABO/RH(D): O POS
Antibody Screen: NEGATIVE
Unit division: 0

## 2019-01-12 LAB — CBC
HEMATOCRIT: 28.3 % — AB (ref 36.0–46.0)
Hemoglobin: 8.5 g/dL — ABNORMAL LOW (ref 12.0–15.0)
MCH: 27.9 pg (ref 26.0–34.0)
MCHC: 30 g/dL (ref 30.0–36.0)
MCV: 92.8 fL (ref 80.0–100.0)
Platelets: 179 10*3/uL (ref 150–400)
RBC: 3.05 MIL/uL — ABNORMAL LOW (ref 3.87–5.11)
RDW: 16.7 % — ABNORMAL HIGH (ref 11.5–15.5)
WBC: 5.6 10*3/uL (ref 4.0–10.5)
nRBC: 0 % (ref 0.0–0.2)

## 2019-01-12 MED ORDER — ASPIRIN EC 325 MG PO TBEC: 325.0000 mg | DELAYED_RELEASE_TABLET | Freq: Every day | ORAL | 3 refills | Status: DC

## 2019-01-12 MED ORDER — ALPRAZOLAM 0.5 MG PO TABS: 0.5000 mg | ORAL_TABLET | Freq: Three times a day (TID) | ORAL | 5 refills | Status: DC | PRN

## 2019-01-12 MED ORDER — DOCUSATE SODIUM 100 MG PO CAPS: 200.0000 mg | ORAL_CAPSULE | Freq: Two times a day (BID) | ORAL | 0 refills | Status: DC

## 2019-01-12 MED ORDER — FERROUS SULFATE 325 (65 FE) MG PO TABS: 325.0000 mg | ORAL_TABLET | Freq: Three times a day (TID) | ORAL | 3 refills | Status: DC

## 2019-01-12 MED ORDER — HYDROCODONE-ACETAMINOPHEN 10-325 MG PO TABS: 1.0000 | ORAL_TABLET | Freq: Four times a day (QID) | ORAL | 0 refills | Status: DC | PRN

## 2019-01-12 MED ORDER — OSELTAMIVIR PHOSPHATE 30 MG PO CAPS: 30.0000 mg | ORAL_CAPSULE | Freq: Every day | ORAL | 0 refills | Status: DC

## 2019-01-12 NOTE — Care Management (Signed)
CM consult for [facility] needs. Pt recommended for SNF. CSW following for placement. CM will sign off.

## 2019-01-12 NOTE — Progress Notes (Signed)
Report called to the [facility]. 

## 2019-01-12 NOTE — Clinical Social Work Placement (Signed)
   CLINICAL SOCIAL WORK PLACEMENT  NOTE  Date:  01/12/2019  Patient Details  Name: Misty [name] MRN: [id number] Date of Birth: 1940-12-18  Clinical Social Work is seeking post-discharge placement for this patient at the Skilled  Nursing Facility level of care (*CSW will initial, date and re-position this form in  chart as items are completed):  Yes   Patient/family provided with [facility] Department's list of facilities offering this level of care within the geographic area requested by the patient (or if unable, by the patient's family).  Yes   Patient/family informed of their freedom to choose among providers that offer the needed level of care, that participate in Medicare, Medicaid or managed care program needed by the patient, have an available bed and are willing to accept the patient.  Yes   Patient/family informed of [facility]'s ownership interest in [facility] and [facility], as well as of the fact that they are under no obligation to receive care at these facilities.  PASRR submitted to EDS on 01/08/19     PASRR number received on 01/08/19     Existing PASRR number confirmed on       FL2 transmitted to all facilities in geographic area requested by pt/family on 01/08/19     FL2 transmitted to all facilities within larger geographic area on       Patient informed that his/her managed care company has contracts with or will negotiate with certain facilities, including the following:        Yes   Patient/family informed of bed offers received.  Patient chooses bed at [facility]     Physician recommends and patient chooses bed at      Patient to be transferred to [facility] on 01/12/19.  Patient to be transferred to facility by [facility] staff     Patient family notified on 01/12/19 of transfer.  Name of family member notified:  spouse and grandson's girlfriend at bedside.      PHYSICIAN       Additional  Comment: Discharge clinicals sent to facility. Patient going to room 119. LCSW signing off.   _______________________________________________ Annice Needy, LCSW 01/12/2019, 12:18 PM

## 2019-01-12 NOTE — Discharge Summary (Signed)
Physician Discharge Summary  Misty [name] SEG:[id number] DOB: [birth year] DOA: 01/06/2019  PCP: Chipper Herb, MD  Admit date: 01/06/2019 Discharge date: 01/12/2019  Admitted From: Home Disposition: Skilled nursing facility  Recommendations for Outpatient Follow-up:  1. Follow up with PCP in 1-2 weeks 2. Please obtain BMP/CBC in one week 3. Follow-up with Dr. Aline Brochure in 2 weeks.  Discharge Condition: Stable CODE STATUS: Full code Diet recommendation: Heart healthy  Brief/Interim Summary: 78 year old female with multiple medical problems, was admitted to the hospital after suffering a fall.  She does not have a left femoral fracture.  She is admitted for further treatments.  Discharge Diagnoses:  Active Problems:   GERD (gastroesophageal reflux disease)   Generalized weakness   Tobacco use disorder   Displaced intertrochanteric fracture of left femur, initial encounter for closed fracture (HCC)   Hypokalemia  1. Acute left femoral fracture.  Seen by orthopedics and underwent left hip gamma nail.  She was seen by physical therapy postoperatively who recommended skilled nursing facility placement.  She will follow-up with Dr. Aline Brochure in the next 2 weeks.  She is weightbearing as tolerated. 2. Acute blood loss anemia.  She was transfused 1 unit of PRBC on 2/24.  Follow-up hemoglobin has been stable.  She does not have any obvious ongoing bleeding.  Continue to follow serial CBC.  DVT prophylaxis change from Lovenox to aspirin per orthopedics. 3. Hypokalemia.  Replace 4. Chronic back pain.  Continued on pain management. 5. COPD/pulmonary fibrosis.  She is on supplemental oxygen.  No shortness of breath or wheezing at this time. 6. Depression/anxiety.  Continued on alprazolam as needed.  Discharge Instructions  Discharge Instructions    Diet - low sodium heart healthy   Complete by:  As directed    Increase activity slowly   Complete by:  As directed      Allergies as of  01/12/2019      Reactions   Codeine Nausea And Vomiting   Tramadol Nausea And Vomiting   Asa [aspirin] Other (See Comments)   Patient is unaware of allergy   Azithromycin Other (See Comments)   Patient is unaware of allergy   Celebrex [celecoxib] Other (See Comments)   Irritated stomach   Cymbalta [duloxetine Hcl] Other (See Comments)   Felt groggy   Prednisone Rash   She has seen the podiatrist and he had injected cortisone in her feet and she had also taken a peel at home. She had a severe reaction to her face with a rash and had to take Benadryl to resolve the symptom. She actually did get more cortisone shots, but no additional reactions to the shots.   Vioxx [rofecoxib] Other (See Comments)   Unknown   Zelnorm [tegaserod] Other (See Comments)   Patient is unaware of allergy   Zocor [simvastatin] Other (See Comments)   Patient is unaware of allergy      Medication List    TAKE these medications   acetaminophen 500 MG tablet Commonly known as:  TYLENOL Take 500 mg by mouth every 6 (six) hours as needed for moderate pain or headache.   albuterol 108 (90 Base) MCG/ACT inhaler Commonly known as:  PROVENTIL HFA;VENTOLIN HFA Inhale 2 puffs into the lungs every 6 (six) hours as needed for wheezing or shortness of breath (cough).   ALPRAZolam 0.5 MG tablet Commonly known as:  XANAX Take 1 tablet (0.5 mg total) by mouth 3 (three) times daily as needed for anxiety.   aspirin EC 325 MG  tablet Take 1 tablet (325 mg total) by mouth daily.   cholecalciferol 1000 units tablet Commonly known as:  VITAMIN D Take 2,000-4,000 Units by mouth daily. Take 2000 units daily except take 4000 units on Saturday and Sunday.   docusate sodium 100 MG capsule Commonly known as:  COLACE Take 2 capsules (200 mg total) by mouth 2 (two) times daily.   escitalopram 20 MG tablet Commonly known as:  LEXAPRO TAKE ONE TABLET AT BEDTIME   ferrous sulfate 325 (65 FE) MG tablet Commonly known as:   FEOSOL Take 1 tablet (325 mg total) by mouth 3 (three) times daily with meals.   HYDROcodone-acetaminophen 10-325 MG tablet Commonly known as:  NORCO Take 1 tablet by mouth every 6 (six) hours as needed for moderate pain or severe pain.   INTEGRA PLUS Caps Take 1 capsule by mouth daily.   multivitamin with minerals Tabs tablet Take 1 tablet by mouth daily. Centrum Silver   nystatin 100000 UNIT/ML suspension Commonly known as:  MYCOSTATIN Take 5 mLs (500,000 Units total) by mouth 4 (four) times daily. Swish and spit   oseltamivir 30 MG capsule Commonly known as:  TAMIFLU Take 1 capsule (30 mg total) by mouth daily. For 10 more days Start taking on:  January 13, 2019   pantoprazole 40 MG tablet Commonly known as:  PROTONIX Take 1 tablet (40 mg total) by mouth 2 (two) times daily before a meal.   vitamin B-12 1000 MCG tablet Commonly known as:  CYANOCOBALAMIN Take 1,000 mcg by mouth daily.       Allergies  Allergen Reactions  . Codeine Nausea And Vomiting  . Tramadol Nausea And Vomiting  . Asa [Aspirin] Other (See Comments)    Patient is unaware of allergy  . Azithromycin Other (See Comments)    Patient is unaware of allergy  . Celebrex [Celecoxib] Other (See Comments)    Irritated stomach   . Cymbalta [Duloxetine Hcl] Other (See Comments)    Felt groggy  . Prednisone Rash    She has seen the podiatrist and he had injected cortisone in her feet and she had also taken a peel at home. She had a severe reaction to her face with a rash and had to take Benadryl to resolve the symptom. She actually did get more cortisone shots, but no additional reactions to the shots.  . Vioxx [Rofecoxib] Other (See Comments)    Unknown  . Zelnorm [Tegaserod] Other (See Comments)    Patient is unaware of allergy  . Zocor [Simvastatin] Other (See Comments)    Patient is unaware of allergy    Consultations:  Orthopedics   Procedures/Studies: Dg Chest 1 View  Result Date:  01/06/2019 CLINICAL DATA:  78 year old female status post fall down steps. Pain. EXAM: CHEST  1 VIEW COMPARISON:  Chest radiographs 12/08/2018 and earlier. FINDINGS: AP supine view at 1903 hours. Multilevel previous spinal compression fracture vertebral augmentation. Stable lung volumes and mediastinal contours. Calcified aortic atherosclerosis. Visualized tracheal air column is within normal limits. Allowing for portable technique the lungs are clear. No pneumothorax or pleural effusion. No acute osseous abnormality identified. IMPRESSION: No acute cardiopulmonary abnormality or acute traumatic injury identified. Electronically Signed   By: Genevie Ann M.D.   On: 01/06/2019 19:35   Ct Head Wo Contrast  Result Date: 01/06/2019 CLINICAL DATA:  78 year old female status post fall down stairs. EXAM: CT HEAD WITHOUT CONTRAST TECHNIQUE: Contiguous axial images were obtained from the base of the skull through the vertex without  intravenous contrast. COMPARISON:  None. FINDINGS: Brain: Incidental dural calcification. Cerebral volume is within normal limits for age. No midline shift, mass effect, or evidence of intracranial mass lesion. No ventriculomegaly. No acute intracranial hemorrhage identified. No cortically based acute infarct identified. Mild-to-moderate bilateral patchy white matter hypodensity. Vascular: Calcified atherosclerosis at the skull base. No suspicious intracranial vascular hyperdensity. Skull: Intact. Sinuses/Orbits: Tympanic cavities and mastoids are clear. Mild mucosal thickening or trace fluid in the left sphenoid sinus. Other: No scalp hematoma identified. Negative visible orbits. IMPRESSION: 1. No acute intracranial abnormality or acute traumatic injury identified. 2. Mild to moderate cerebral white matter changes, most commonly due to chronic small vessel disease. Electronically Signed   By: Genevie Ann M.D.   On: 01/06/2019 23:17   Dg Hip Operative Unilat With Pelvis Left  Result Date:  01/07/2019 CLINICAL DATA:  Fracture fixation EXAM: OPERATIVE left HIP (WITH PELVIS IF PERFORMED) 7 VIEWS TECHNIQUE: Fluoroscopic spot image(s) were submitted for interpretation post-operatively. COMPARISON:  01/06/2019 FINDINGS: C-arm images reveal fixation of intertrochanteric fracture on the left with compression screw and intramedullary rod. Satisfactory alignment. IMPRESSION: ORIF left intertrochanteric fracture. Electronically Signed   By: Franchot Gallo M.D.   On: 01/07/2019 15:57   Dg Hip Unilat W Or Wo Pelvis 2-3 Views Left  Result Date: 01/06/2019 CLINICAL DATA:  78 year old female status post fall down steps. Pain. EXAM: DG HIP (WITH OR WITHOUT PELVIS) 2-3V LEFT COMPARISON:  [facility] Lumbar radiographs 04/16/2018. FINDINGS: Comminuted proximal left femur intertrochanteric fracture with anterior displacement of 1/2 shaft with, mild varus impaction, mildly displaced trochanter butterfly fragments. Left femoral head remains normally located. No superimposed acute fracture of the pelvis is identified. Grossly intact proximal right femur. Aortoiliac calcified atherosclerosis. Nonobstructed visible bowel gas pattern. IMPRESSION: Comminuted left femur intertrochanteric fracture with anterior displacement and mild varus impaction. Electronically Signed   By: Genevie Ann M.D.   On: 01/06/2019 19:37       Subjective: Continues to complain of some pain in her hip.  No shortness of breath.  Discharge Exam: Vitals:   01/11/19 1945 01/11/19 2023 01/11/19 2220 01/12/19 0552  BP:  (!) 173/102 139/74 (!) 148/78  Pulse:  (!) 55 79 80  Resp:  18 15 16   Temp:  98.1 F (36.7 C) 97.8 F (36.6 C) 98.1 F (36.7 C)  TempSrc:  Oral Oral Oral  SpO2: 97% 96% 99% 99%  Weight:      Height:        General: Pt is alert, awake, not in acute distress Cardiovascular: RRR, S1/S2 +, no rubs, no gallops Respiratory: CTA bilaterally, no wheezing, no rhonchi Abdominal: Soft, NT, ND, bowel sounds  + Extremities: Left lower extremity bandages clean and dry.  Normal limb alignment.    The results of significant diagnostics from this hospitalization (including imaging, microbiology, ancillary and laboratory) are listed below for reference.     Microbiology: Recent Results (from the past 240 hour(s))  Surgical pcr screen     Status: None   Collection Time: 01/07/19 12:50 PM  Result Value Ref Range Status   MRSA, PCR NEGATIVE NEGATIVE Final   Staphylococcus aureus NEGATIVE NEGATIVE Final    Comment: (NOTE) The Xpert SA Assay (FDA approved for NASAL specimens in patients 73 years of age and older), is one component of a comprehensive surveillance program. It is not intended to diagnose infection nor to guide or monitor treatment. Performed at [facility], [address], [place], [place] [address]  Labs: BNP (last 3 results) No results for input(s): BNP in the last 8760 hours. Basic Metabolic Panel: Recent Labs  Lab 01/06/19 1700 01/07/19 0513 01/08/19 0621 01/09/19 0618  NA 143 142 142 139  K 2.9* 3.2* 3.9 3.8  CL 104 102 101 99  CO2 29 33* 34* 31  GLUCOSE 152* 142* 114* 98  BUN 8 7* 13 15  CREATININE 0.66 0.61 0.77 0.74  CALCIUM 8.6* 8.1* 8.4* 8.3*  MG 1.7  --  1.6*  --    Liver Function Tests: Recent Labs  Lab 01/06/19 1700  AST 23  ALT 20  ALKPHOS 90  BILITOT 0.4  PROT 6.3*  ALBUMIN 3.6   No results for input(s): LIPASE, AMYLASE in the last 168 hours. No results for input(s): AMMONIA in the last 168 hours. CBC: Recent Labs  Lab 01/06/19 1700  01/08/19 0621 01/09/19 0618 01/10/19 0604 01/11/19 0930 01/12/19 0507  WBC 11.6*   < > 10.0 8.6 8.1 6.2 5.6  NEUTROABS 9.0*  --   --   --   --   --   --   HGB 11.2*   < > 8.6* 8.0* 8.0* 7.3* 8.5*  HCT 37.9   < > 30.4* 27.2* 27.5* 24.9* 28.3*  MCV 95.9   < > 98.1 97.5 97.9 96.1 92.8  PLT 171   < > 154 135* 141* 178 179   < > = values in this interval not displayed.   Cardiac Enzymes: No results  for input(s): CKTOTAL, CKMB, CKMBINDEX, TROPONINI in the last 168 hours. BNP: Invalid input(s): POCBNP CBG: Recent Labs  Lab 01/09/19 0745 01/09/19 1155 01/09/19 1627  GLUCAP 107* 111* 253*   D-Dimer No results for input(s): DDIMER in the last 72 hours. Hgb A1c No results for input(s): HGBA1C in the last 72 hours. Lipid Profile No results for input(s): CHOL, HDL, LDLCALC, TRIG, CHOLHDL, LDLDIRECT in the last 72 hours. Thyroid function studies No results for input(s): TSH, T4TOTAL, T3FREE, THYROIDAB in the last 72 hours.  Invalid input(s): FREET3 Anemia work up No results for input(s): VITAMINB12, FOLATE, FERRITIN, TIBC, IRON, RETICCTPCT in the last 72 hours. Urinalysis    Component Value Date/Time   COLORURINE YELLOW 01/27/2016 1800   APPEARANCEUR CLEAR 01/27/2016 1800   LABSPEC 1.015 01/27/2016 1800   PHURINE 5.5 01/27/2016 1800   GLUCOSEU NEGATIVE 01/27/2016 1800   HGBUR NEGATIVE 01/27/2016 1800   BILIRUBINUR NEGATIVE 01/27/2016 1800   KETONESUR NEGATIVE 01/27/2016 1800   PROTEINUR NEGATIVE 01/27/2016 1800   NITRITE NEGATIVE 01/27/2016 1800   LEUKOCYTESUR SMALL (A) 01/27/2016 1800   Sepsis Labs Invalid input(s): PROCALCITONIN,  WBC,  LACTICIDVEN Microbiology Recent Results (from the past 240 hour(s))  Surgical pcr screen     Status: None   Collection Time: 01/07/19 12:50 PM  Result Value Ref Range Status   MRSA, PCR NEGATIVE NEGATIVE Final   Staphylococcus aureus NEGATIVE NEGATIVE Final    Comment: (NOTE) The Xpert SA Assay (FDA approved for NASAL specimens in patients 61 years of age and older), is one component of a comprehensive surveillance program. It is not intended to diagnose infection nor to guide or monitor treatment. Performed at [facility], [address] [place], [place] [address]      Time coordinating discharge: 42mns  SIGNED:   JKathie Dike MD  [facility] 01/12/2019, 11:41 AM   If 7PM-7AM, please contact  night-coverage [email or web address]

## 2019-01-12 NOTE — Progress Notes (Signed)
Patient ID: Misty [name], female   DOB: 29-Apr-1941, 78 y.o.   MRN: [id number] BP (!) 148/78   Pulse 80   Temp 98.1 F (36.7 C) (Oral)   Resp 16   Ht 5' 7"  (1.702 m)   Wt 59.9 kg   SpO2 99%   BMI 20.68 kg/m  CBC Latest Ref Rng & Units 01/12/2019 01/11/2019 01/10/2019  WBC 4.0 - 10.5 K/uL 5.6 6.2 8.1  Hemoglobin 12.0 - 15.0 g/dL 8.5(L) 7.3(L) 8.0(L)  Hematocrit 36.0 - 46.0 % 28.3(L) 24.9(L) 27.5(L)  Platelets 150 - 400 K/uL 179 178 141(L)   BMP Latest Ref Rng & Units 01/09/2019 01/08/2019 01/07/2019  Glucose 70 - 99 mg/dL 98 114(H) 142(H)  BUN 8 - 23 mg/dL 15 13 7(L)  Creatinine 0.44 - 1.00 mg/dL 0.74 0.77 0.61  BUN/Creat Ratio 12 - 28 - - -  Sodium 135 - 145 mmol/L 139 142 142  Potassium 3.5 - 5.1 mmol/L 3.8 3.9 3.2(L)  Chloride 98 - 111 mmol/L 99 101 102  CO2 22 - 32 mmol/L 31 34(H) 33(H)  Calcium 8.9 - 10.3 mg/dL 8.3(L) 8.4(L) 8.1(L)

## 2019-01-13 ENCOUNTER — Encounter: Payer: Self-pay | Admitting: Adult Health

## 2019-01-13 ENCOUNTER — Telehealth: Payer: Self-pay | Admitting: *Deleted

## 2019-01-13 ENCOUNTER — Other Ambulatory Visit: Payer: Self-pay | Admitting: Adult Health

## 2019-01-13 ENCOUNTER — Non-Acute Institutional Stay (SKILLED_NURSING_FACILITY): Payer: Medicare Other | Admitting: Adult Health

## 2019-01-13 DIAGNOSIS — K219 Gastro-esophageal reflux disease without esophagitis: Secondary | ICD-10-CM

## 2019-01-13 DIAGNOSIS — J8410 Pulmonary fibrosis, unspecified: Secondary | ICD-10-CM

## 2019-01-13 DIAGNOSIS — F419 Anxiety disorder, unspecified: Secondary | ICD-10-CM | POA: Diagnosis not present

## 2019-01-13 DIAGNOSIS — K5903 Drug induced constipation: Secondary | ICD-10-CM

## 2019-01-13 DIAGNOSIS — F329 Major depressive disorder, single episode, unspecified: Secondary | ICD-10-CM

## 2019-01-13 DIAGNOSIS — S72142S Displaced intertrochanteric fracture of left femur, sequela: Secondary | ICD-10-CM | POA: Diagnosis not present

## 2019-01-13 DIAGNOSIS — D62 Acute posthemorrhagic anemia: Secondary | ICD-10-CM | POA: Diagnosis not present

## 2019-01-13 DIAGNOSIS — T402X5A Adverse effect of other opioids, initial encounter: Secondary | ICD-10-CM

## 2019-01-13 MED ORDER — ALPRAZOLAM 0.5 MG PO TABS: 0.5000 mg | ORAL_TABLET | Freq: Three times a day (TID) | ORAL | 0 refills | Status: DC | PRN

## 2019-01-13 MED ORDER — HYDROCODONE-ACETAMINOPHEN 10-325 MG PO TABS: 1.0000 | ORAL_TABLET | Freq: Four times a day (QID) | ORAL | 0 refills | Status: DC | PRN

## 2019-01-13 NOTE — Telephone Encounter (Signed)
Transitional Care Note Hospital Discharge to Skilled Nursing Facility  Misty [name] was admitted to [facility] on 01/06/2019 with a closed left hip fracture s/p fall.  She was transferred from [name] to [facility] (Skilled Nursing Facility) on 01/12/2019.  She will be followed by the facility's staff physicians while there and will need to follow-up with Ernestina Penna, MD (PCP) within two weeks of discharge from SNF for Transitional Care Management.   Plan . Clinical staff will monitor discharge reports and contact patient to initiate TCM within 2 business days of SNF discharge.  . After discharge CCM team will collaborate with PCP regarding potential CCM eligibility and the benefits CCM services provide the patient. The primary goal would be to increase patient self-management of chronic medical conditions resulting in improved patient health outcomes, decreased exacerbations of chronic medical conditions, and decreased ED visits/hospital admissions.   Demetrios Loll, RN-BC, BSN Nurse Case Manager [facility] [phone]

## 2019-01-13 NOTE — Progress Notes (Signed)
Location:   [place] Room Number: 119 P Place of Service:  SNF (31)   CODE STATUS: DNR  Allergies  Allergen Reactions  . Codeine Nausea And Vomiting  . Tramadol Nausea And Vomiting  . Asa [Aspirin] Other (See Comments)    Patient is unaware of allergy  . Azithromycin Other (See Comments)    Patient is unaware of allergy  . Celebrex [Celecoxib] Other (See Comments)    Irritated stomach   . Cymbalta [Duloxetine Hcl] Other (See Comments)    Felt groggy  . Prednisone Rash    She has seen the podiatrist and he had injected cortisone in her feet and she had also taken a peel at home. She had a severe reaction to her face with a rash and had to take Benadryl to resolve the symptom. She actually did get more cortisone shots, but no additional reactions to the shots.  . Vioxx [Rofecoxib] Other (See Comments)    Unknown  . Zelnorm [Tegaserod] Other (See Comments)    Patient is unaware of allergy  . Zocor [Simvastatin] Other (See Comments)    Patient is unaware of allergy    Chief Complaint  Patient presents with  . Hospitalization Follow-up    Hospital follow uup    HPI:  She is a 78 year old woman who suffered a fall at home. She sufferd a left femoral fracture and underwent a left hip nail. She had acute blood loss anemia;and received PRBC. She is here for short term rehab. She continues to have left hip pain. She does have anxiety and requires a needed xanax. She denies any fevers; no changes in appetite. She will continue to be followed for her chronic illnesses including: anemia; copd; depression.   Past Medical History:  Diagnosis Date  . Anxiety    takes Xanax daily  . Arthritis    back  . Cataract   . Chronic back pain   . Constipation    OTC stool softener prn  . COPD (chronic obstructive pulmonary disease) ([place])   . Depression   . Diverticulosis   . GERD (gastroesophageal reflux disease)    takes Ranidine daily  . Hemorrhoids   .  History of bronchitis   . History of migraine    many yrs ago  . History of shingles   . Hyperlipidemia   . Insomnia   . Migraines   . Nocturia   . Numbness    left leg  . Osteoporosis   . PONV (postoperative nausea and vomiting)   . Urinary frequency   . Urinary urgency     Past Surgical History:  Procedure Laterality Date  . ABDOMINAL HYSTERECTOMY    . COLONOSCOPY    . COLONOSCOPY N/A 02/17/2015   Procedure: COLONOSCOPY;  Surgeon: Rogene Houston, MD;  [facility]: [facility];  Service: Endoscopy;  Laterality: N/A;  110n - moved to 11:15 - Ann to notify pt  . ESOPHAGOGASTRODUODENOSCOPY    . ESOPHAGOGASTRODUODENOSCOPY N/A 01/29/2016   Procedure: ESOPHAGOGASTRODUODENOSCOPY (EGD);  Surgeon: Rogene Houston, MD;  [facility]: [facility];  Service: Endoscopy;  Laterality: N/A;  . ESOPHAGOGASTRODUODENOSCOPY N/A 10/14/2017   Procedure: ESOPHAGOGASTRODUODENOSCOPY (EGD);  Surgeon: Rogene Houston, MD;  Location: [facility];  Service: Endoscopy;  Laterality: N/A;  730  . EYE SURGERY Right 3/15   cataracts  . INTRAMEDULLARY (IM) NAIL INTERTROCHANTERIC Left 01/07/2019   Procedure: INTRAMEDULLARY (IM) NAIL INTERTROCHANTRIC;  Surgeon: Carole Civil, MD;  Location:  [facility];  Service: Orthopedics;  Laterality: Left;  . KYPHOPLASTY N/A 07/08/2014   Procedure: Thoracic Eleven Kyphoplasty;  Surgeon: Hosie Spangle, MD;  Location: [place] [facility];  Service: Neurosurgery;  Laterality: N/A;  . LUMBAR LAMINECTOMY/DECOMPRESSION MICRODISCECTOMY Bilateral 05/20/2013   Procedure: LUMBAR LAMINECTOMY/DECOMPRESSION MICRODISCECTOMY 1 LEVEL;  Surgeon: Hosie Spangle, MD;  Location: [place] [facility];  Service: Neurosurgery;  Laterality: Bilateral;  Bilateral Lumbar four-five laminotomy and left lumbar four-five microdiskectomy  . SPINE SURGERY     Dr Carloyn Manner -  vertebraplasty    Social History   Socioeconomic History  . Marital status: Married    Spouse name: Sterling Big   . Number of children: Not on file  .  Years of education: Not on file  . Highest education level: Not on file  Occupational History  . Occupation: retired     Comment: [place] work - came out in [employer]  . Financial resource strain: Not on file  . Food insecurity:    Worry: Not on file    Inability: Not on file  . Transportation needs:    Medical: Not on file    Non-medical: Not on file  Tobacco Use  . Smoking status: Current Some Day Smoker    Packs/day: 1.00    Years: 52.00    Pack years: 52.00    Types: Cigarettes    Start date: 11/18/1958    Last attempt to quit: 08/20/2011    Years since quitting: 7.4  . Smokeless tobacco: Never Used  . Tobacco comment: 1/2-1pack off and on all her life  Substance and Sexual Activity  . Alcohol use: No    Alcohol/week: 0.0 standard drinks  . Drug use: No  . Sexual activity: Not Currently  Lifestyle  . Physical activity:    Days per week: Not on file    Minutes per session: Not on file  . Stress: Not on file  Relationships  . Social connections:    Talks on phone: Not on file    Gets together: Not on file    Attends religious service: Not on file    Active member of club or organization: Not on file    Attends meetings of clubs or organizations: Not on file    Relationship status: Not on file  . Intimate partner violence:    Fear of current or ex partner: Not on file    Emotionally abused: Not on file    Physically abused: Not on file    Forced sexual activity: Not on file  Other Topics Concern  . Not on file  Social History Narrative   Lives at home with husband, Sterling Big.   Had one son- now deceased    Family History  Problem Relation Age of Onset  . Hypertension Mother   . Macular degeneration Father   . Heart disease Father   . Cirrhosis Son   . Heart disease Son   . Colon cancer Neg Hx       VITAL SIGNS BP 132/68   Pulse 90   Temp (!) 97.2 F (36.2 C)   Resp 20   Outpatient Encounter Medications as of 01/13/2019  Medication Sig  .  acetaminophen (TYLENOL) 500 MG tablet Take 500 mg by mouth every 6 (six) hours as needed for moderate pain or headache.   . albuterol (PROVENTIL HFA;VENTOLIN HFA) 108 (90 Base) MCG/ACT inhaler Inhale 2 puffs into the lungs every 6 (six) hours as needed for wheezing or shortness  of breath (cough).  . ALPRAZolam (XANAX) 0.5 MG tablet Take 1 tablet (0.5 mg total) by mouth 3 (three) times daily as needed for anxiety.  Marland Kitchen aspirin EC 325 MG tablet Take 1 tablet (325 mg total) by mouth daily.  . cholecalciferol (VITAMIN D) 1000 UNITS tablet Take 2,000-4,000 Units by mouth daily. Take 2000 units daily except take 4000 units on Saturday and Sunday.  . docusate sodium (COLACE) 100 MG capsule Take 2 capsules (200 mg total) by mouth 2 (two) times daily.  Marland Kitchen escitalopram (LEXAPRO) 20 MG tablet Take 20 mg by mouth at bedtime.  . FeFum-FePoly-FA-B Cmp-C-Biot (INTEGRA PLUS) CAPS Take 1 capsule by mouth daily.  . ferrous sulfate (FEOSOL) 325 (65 FE) MG tablet Take 1 tablet (325 mg total) by mouth 3 (three) times daily with meals.  Marland Kitchen HYDROcodone-acetaminophen (NORCO) 10-325 MG tablet Take 1 tablet by mouth every 6 (six) hours as needed for moderate pain or severe pain.  . Multiple Vitamin (MULTIVITAMIN WITH MINERALS) TABS tablet Take 1 tablet by mouth daily. Centrum Silver  . NON FORMULARY Diet Type: NAS  . nystatin (MYCOSTATIN) 100000 UNIT/ML suspension Take 5 mLs (500,000 Units total) by mouth 4 (four) times daily. Swish and spit  . oseltamivir (TAMIFLU) 30 MG capsule Take 1 capsule (30 mg total) by mouth daily. For 10 more days  . pantoprazole (PROTONIX) 40 MG tablet Take 1 tablet (40 mg total) by mouth 2 (two) times daily before a meal.  . vitamin B-12 (CYANOCOBALAMIN) 1000 MCG tablet Take 1,000 mcg by mouth daily.  . [DISCONTINUED] escitalopram (LEXAPRO) 20 MG tablet TAKE ONE TABLET AT BEDTIME (Patient not taking: No sig reported)   No facility-administered encounter medications on file as of 01/13/2019.       SIGNIFICANT DIAGNOSTIC EXAMS  TODAY;   01-06-19: ct of head: 1. No acute intracranial abnormality or acute traumatic injury identified. 2. Mild to moderate cerebral white matter changes, most commonly due to chronic small vessel disease.  01-06-19: left hip and pelvic x-ray: Comminuted left femur intertrochanteric fracture with anterior displacement and mild varus impaction.  LABS REVIEWED TODAY:   01-09-19: wbc 8.6; hgb 8.0; hct 27.2; mcv 97.5; plt 135; glucose 98; bun 15; creat 0.74; k+ 3.8; na++ 139; ca 8.3 01-12-19: wbc 5.6; hgb 85; hgb 28.3; mcv 92.;8 plt 179   Review of Systems  Constitutional: Negative for malaise/fatigue.  Respiratory: Negative for cough and shortness of breath.   Cardiovascular: Negative for chest pain, palpitations and leg swelling.  Gastrointestinal: Negative for abdominal pain, constipation and heartburn.  Genitourinary:       Frequent urination at night with urinary incontinence   Musculoskeletal: Positive for joint pain. Negative for back pain and myalgias.       Left hip pain  Skin: Negative.   Neurological: Negative for dizziness.  Psychiatric/Behavioral: The patient is nervous/anxious.    Physical Exam Constitutional:      General: She is not in acute distress.    Appearance: She is well-developed. She is not diaphoretic.     Comments: frail  Neck:     Musculoskeletal: Neck supple.     Thyroid: No thyromegaly.  Cardiovascular:     Rate and Rhythm: Normal rate and regular rhythm.     Pulses: Normal pulses.     Heart sounds: Normal heart sounds.  Pulmonary:     Effort: Pulmonary effort is normal. No respiratory distress.     Breath sounds: Normal breath sounds.  Abdominal:     General: Bowel  sounds are normal. There is no distension.     Palpations: Abdomen is soft.     Tenderness: There is no abdominal tenderness.  Musculoskeletal:     Right lower leg: No edema.     Left lower leg: No edema.     Comments: Is able to move all  extremities Is status post left hip fracture Has history of kyphoplasty   Lymphadenopathy:     Cervical: No cervical adenopathy.  Skin:    General: Skin is warm and dry.     Comments: Incision line without signs of infection present   Neurological:     Mental Status: She is alert and oriented to person, place, and time.  Psychiatric:        Mood and Affect: Mood normal.      ASSESSMENT/ PLAN:  TODAY;   1. Pulmonary fibrosis: is stable will continue albuterol 2 puffs every 6 hours as needed;   2. GERD without esophagitis: is stable will continue protonix 40 mg twice daily   3. Displaced intertochanteric fracture of left femur sequela: is stable will continue therapy as directed will follow up with orthopedics. Will continue vicodin 10/325 mg every 6 hours as needed for pain and asa   4. Acute blood loss anemia: is stable hgb 8.5; will continue integra plus daily and iron three times daily  5. Constipation due to opioid therapy: is stable will continue colace 200 mg twice daily   6. Anxiety and depression: is stable will continue lexapro 20 mg daily and has xanax 0.5 mg three times daily as needed for 14 days.   Will check cbc; bmp on 01-18-19 Will get ua/c&s due to her new incontinence and worsening nocturia   MD is aware of resident's narcotic use and is in agreement with current plan of care. We will attempt to wean resident as apropriate   Ok Edwards NP [facility]  Contact [phone] Monday through Friday 8am- 5pm  After hours call [phone]

## 2019-01-14 ENCOUNTER — Non-Acute Institutional Stay (SKILLED_NURSING_FACILITY): Payer: Medicare Other | Admitting: Internal Medicine

## 2019-01-14 ENCOUNTER — Other Ambulatory Visit (HOSPITAL_COMMUNITY)
Admission: RE | Admit: 2019-01-14 | Discharge: 2019-01-14 | Disposition: A | Discharge status: Home / Self Care | Payer: Medicare Other | Source: Skilled Nursing Facility | Attending: Adult Health | Admitting: Adult Health

## 2019-01-14 ENCOUNTER — Encounter: Payer: Self-pay | Admitting: Internal Medicine

## 2019-01-14 DIAGNOSIS — F329 Major depressive disorder, single episode, unspecified: Secondary | ICD-10-CM

## 2019-01-14 DIAGNOSIS — D509 Iron deficiency anemia, unspecified: Secondary | ICD-10-CM

## 2019-01-14 DIAGNOSIS — K219 Gastro-esophageal reflux disease without esophagitis: Secondary | ICD-10-CM

## 2019-01-14 DIAGNOSIS — F419 Anxiety disorder, unspecified: Secondary | ICD-10-CM

## 2019-01-14 DIAGNOSIS — F32A Depression, unspecified: Secondary | ICD-10-CM

## 2019-01-14 DIAGNOSIS — F17200 Nicotine dependence, unspecified, uncomplicated: Secondary | ICD-10-CM

## 2019-01-14 DIAGNOSIS — S72142S Displaced intertrochanteric fracture of left femur, sequela: Secondary | ICD-10-CM

## 2019-01-14 DIAGNOSIS — Z9889 Other specified postprocedural states: Secondary | ICD-10-CM

## 2019-01-14 DIAGNOSIS — R351 Nocturia: Secondary | ICD-10-CM | POA: Insufficient documentation

## 2019-01-14 DIAGNOSIS — M810 Age-related osteoporosis without current pathological fracture: Secondary | ICD-10-CM

## 2019-01-14 HISTORY — DX: Other specified postprocedural states: Z98.890

## 2019-01-14 LAB — URINALYSIS, ROUTINE W REFLEX MICROSCOPIC
BILIRUBIN URINE: NEGATIVE
Glucose, UA: NEGATIVE mg/dL
HGB URINE DIPSTICK: NEGATIVE
Ketones, ur: NEGATIVE mg/dL
Leukocytes,Ua: NEGATIVE
Nitrite: NEGATIVE
Protein, ur: NEGATIVE mg/dL
Specific Gravity, Urine: 1.012 (ref 1.005–1.030)
pH: 7 (ref 5.0–8.0)

## 2019-01-14 NOTE — Progress Notes (Signed)
Provider:  Einar Crow, MD Location:  [facility] Room Number: 119 P Place of Service:  SNF ([phone])  PCP: Ernestina Penna, MD Patient Care Team: Ernestina Penna, MD as PCP - General (Family Medicine) Malissa Hippo, MD as Consulting Physician (Gastroenterology) Trey Sailors, MD as Attending Physician (Neurosurgery) Mincey, Daisy Blossom, MD as Consulting Physician (Ophthalmology)  Extended Emergency Contact Information Primary Emergency Contact: Mariea Stable Address: [address]          [place], [place] [address] Darden Amber of [place] Home Phone: [phone] Relation: Spouse Secondary Emergency Contact: Loel [place], [place] [address] Darden Amber of [place] Phone: [phone] Relation: Relative  Code Status: DNR Goals of Care: Advanced Directive information Advanced Directives 01/14/2019  Does Patient Have a Medical Advance Directive? Yes  Type of Advance Directive Out of facility DNR (pink MOST or yellow form)  Does patient want to make changes to medical advance directive? No - Patient declined  Copy of Healthcare Power of Attorney in Chart? -  Would patient like information on creating a medical advance directive? No - Patient declined  Pre-existing out of facility DNR order (yellow form or pink MOST form) -      Chief Complaint  Patient presents with  . New Admit To SNF    Admission    HPI: Patient is a 78 y.o. female seen today for admission to SNF for therapy.  Patient was in the hospital from 02/19-02/25 for Acute Left  Femoral Fracture. She is now in SNF for therapy  Patient has PMH of COPD, Still Smoke, S/P Kyphoplasty at T 8 and  L2 on 01/22 ,, H/o Osteoporosis, Hyperlipidemia, GERD, Vit D def, GERD  Patient sustained mechanical fall outside her Home. She sustained Left Femoral Neck Fracture. She underwent ORIF with Gamma Nail by Dr Romeo Apple on 02/20. She needed I unit PRBC after surgery She is WBAT Her Main Complain  is Pain. She is on Chronic Norco 10 mg Q4 hours. And since she is on Q 6 Hours. She is upset. She says she cant do therapy as he [ain is intense in her back anf Left Hip. Cant sleep or do anything due to pain Patient lives with her husband Who ? Has Memory issues. She was driving before this fall. Walks with No cane or walker.       Past Medical History:  Diagnosis Date  . Anxiety    takes Xanax daily  . Arthritis    back  . Cataract   . Chronic back pain   . Constipation    OTC stool softener prn  . COPD (chronic obstructive pulmonary disease) (HCC)   . Depression   . Diverticulosis   . GERD (gastroesophageal reflux disease)    takes Ranidine daily  . Hemorrhoids   . History of bronchitis   . History of migraine    many yrs ago  . History of shingles   . Hyperlipidemia   . Insomnia   . Migraines   . Nocturia   . Numbness    left leg  . Osteoporosis   . PONV (postoperative nausea and vomiting)   . Urinary frequency   . Urinary urgency    Past Surgical History:  Procedure Laterality Date  . ABDOMINAL HYSTERECTOMY    . COLONOSCOPY    . COLONOSCOPY N/A 02/17/2015   Procedure: COLONOSCOPY;  Surgeon: Malissa Hippo, MD;  Location: [facility];  Service: Endoscopy;  Laterality: N/A;  110n - moved to 11:15 - Ann to notify pt  . ESOPHAGOGASTRODUODENOSCOPY    . ESOPHAGOGASTRODUODENOSCOPY N/A 01/29/2016   Procedure: ESOPHAGOGASTRODUODENOSCOPY (EGD);  Surgeon: Malissa Hippo, MD;  Location: [facility];  Service: Endoscopy;  Laterality: N/A;  . ESOPHAGOGASTRODUODENOSCOPY N/A 10/14/2017   Procedure: ESOPHAGOGASTRODUODENOSCOPY (EGD);  Surgeon: Malissa Hippo, MD;  Location: [facility];  Service: Endoscopy;  Laterality: N/A;  730  . EYE SURGERY Right 3/15   cataracts  . INTRAMEDULLARY (IM) NAIL INTERTROCHANTERIC Left 01/07/2019   Procedure: INTRAMEDULLARY (IM) NAIL INTERTROCHANTRIC;  Surgeon: Vickki Hearing, MD;  Location: [facility];  Service: Orthopedics;   Laterality: Left;  . KYPHOPLASTY N/A 07/08/2014   Procedure: Thoracic Eleven Kyphoplasty;  Surgeon: Hewitt Shorts, MD;  Location: [facility];  Service: Neurosurgery;  Laterality: N/A;  . LUMBAR LAMINECTOMY/DECOMPRESSION MICRODISCECTOMY Bilateral 05/20/2013   Procedure: LUMBAR LAMINECTOMY/DECOMPRESSION MICRODISCECTOMY 1 LEVEL;  Surgeon: Hewitt Shorts, MD;  Location: [facility];  Service: Neurosurgery;  Laterality: Bilateral;  Bilateral Lumbar four-five laminotomy and left lumbar four-five microdiskectomy  . SPINE SURGERY     Dr Channing Mutters -  vertebraplasty    reports that she has been smoking cigarettes. She started smoking about 60 years ago. She has a 52.00 pack-year smoking history. She has never used smokeless tobacco. She reports that she does not drink alcohol or use drugs. Social History   Socioeconomic History  . Marital status: Married    Spouse name: Velda Shell   . Number of children: Not on file  . Years of education: Not on file  . Highest education level: Not on file  Occupational History  . 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 Financial resource strain: Not on file  . Food insecurity:    Worry: Not on file    Inability: Not on file  . Transportation needs:    Medical: Not on file    Non-medical: Not on file  Tobacco Use  . Smoking status: Current Some Day Smoker    Packs/day: 1.00    Years: 52.00    Pack years: 52.00    Types: Cigarettes    Start date: 11/18/1958    Last attempt to quit: 08/20/2011    Years since quitting: 7.4  . Smokeless tobacco: Never Used  . Tobacco comment: 1/2-1pack off and on all her life  Substance and Sexual Activity  . Alcohol use: No    Alcohol/week: 0.0 standard drinks  . Drug use: No  . Sexual activity: Not Currently  Lifestyle  . Physical activity:    Days per week: Not on file    Minutes per session: Not on file  . Stress: Not on file  Relationships  . Social connections:    Talks on  phone: Not on file    Gets together: Not on file    Attends religious service: Not on file    Active member of club or organization: Not on file    Attends meetings of clubs or organizations: Not on file    Relationship status: Not on file  . Intimate partner violence:    Fear of current or ex partner: Not on file    Emotionally abused: Not on file    Physically abused: Not on file    Forced sexual activity: Not on file  Other Topics Concern  . Not on file  Social History Narrative  Lives at home with husband, Velda Shell.   Had one son- now deceased     Functional Status Survey:    Family History  Problem Relation Age of Onset  . Hypertension Mother   . Macular degeneration Father   . Heart disease Father   . Cirrhosis Son   . Heart disease Son   . Colon cancer Neg Hx     Health Maintenance  Topic Date Due  . PAP SMEAR-Modifier  02/11/2019 (Originally 08/19/2011)  . MAMMOGRAM  11/25/2019  . TETANUS/TDAP  07/17/2021  . INFLUENZA VACCINE  Completed  . DEXA SCAN  Completed  . PNA vac Low Risk Adult  Completed    Allergies  Allergen Reactions  . Codeine Nausea And Vomiting  . Tramadol Nausea And Vomiting  . Asa [Aspirin] Other (See Comments)    Patient is unaware of allergy  . Azithromycin Other (See Comments)    Patient is unaware of allergy  . Celebrex [Celecoxib] Other (See Comments)    Irritated stomach   . Cymbalta [Duloxetine Hcl] Other (See Comments)    Felt groggy  . Prednisone Rash    She has seen the podiatrist and he had injected cortisone in her feet and she had also taken a peel at home. She had a severe reaction to her face with a rash and had to take Benadryl to resolve the symptom. She actually did get more cortisone shots, but no additional reactions to the shots.  . Vioxx [Rofecoxib] Other (See Comments)    Unknown  . Zelnorm [Tegaserod] Other (See Comments)    Patient is unaware of allergy  . Zocor [Simvastatin] Other (See Comments)    Patient  is unaware of allergy    Outpatient Encounter Medications as of 01/14/2019  Medication Sig  . acetaminophen (TYLENOL) 500 MG tablet Take 500 mg by mouth every 6 (six) hours as needed for moderate pain or headache.   . albuterol (PROVENTIL HFA;VENTOLIN HFA) 108 (90 Base) MCG/ACT inhaler Inhale 2 puffs into the lungs every 6 (six) hours as needed for wheezing or shortness of breath (cough).  . ALPRAZolam (XANAX) 0.5 MG tablet Take 1 tablet (0.5 mg total) by mouth 3 (three) times daily as needed for up to 14 days for anxiety.  Marland Kitchen aspirin EC 325 MG tablet Take 1 tablet (325 mg total) by mouth daily.  Lucilla Lame [place]-Castor Oil (VENELEX) OINT Apply topically to Sacrum and bilateral buttocks every shift and as needed for prevention  . cholecalciferol (VITAMIN D) 1000 UNITS tablet Take 2,000-4,000 Units by mouth daily. Take 2000 units daily except take 4000 units on Saturday and Sunday.  . docusate sodium (COLACE) 100 MG capsule Take 2 capsules (200 mg total) by mouth 2 (two) times daily.  Marland Kitchen escitalopram (LEXAPRO) 20 MG tablet Take 20 mg by mouth at bedtime.  . FeFum-FePoly-FA-B Cmp-C-Biot (INTEGRA PLUS) CAPS Take 1 capsule by mouth daily.  . ferrous sulfate (FEOSOL) 325 (65 FE) MG tablet Take 1 tablet (325 mg total) by mouth 3 (three) times daily with meals.  Marland Kitchen HYDROcodone-acetaminophen (NORCO) 10-325 MG tablet Take 1 tablet by mouth every 6 (six) hours as needed for up to 7 days for moderate pain or severe pain.  . Multiple Vitamin (MULTIVITAMIN WITH MINERALS) TABS tablet Take 1 tablet by mouth daily. Centrum Silver  . nicotine (NICODERM CQ - DOSED IN MG/24 HOURS) 21 mg/24hr patch Place 21 mg onto the skin daily. Change patch every 24 hours  . NON FORMULARY Diet Type: NAS  .  Nutritional Supplements (ENSURE ENLIVE PO) Take 1 Bottle by mouth 2 (two) times daily between meals.  . nystatin (MYCOSTATIN) 100000 UNIT/ML suspension Take 5 mLs (500,000 Units total) by mouth 4 (four) times daily. Swish and spit  .  oseltamivir (TAMIFLU) 30 MG capsule Take 1 capsule (30 mg total) by mouth daily. For 10 more days  . pantoprazole (PROTONIX) 40 MG tablet Take 1 tablet (40 mg total) by mouth 2 (two) times daily before a meal.  . vitamin B-12 (CYANOCOBALAMIN) 1000 MCG tablet Take 1,000 mcg by mouth daily.   No facility-administered encounter medications on file as of 01/14/2019.     Review of Systems  Constitutional: Positive for activity change and appetite change.  HENT: Negative.   Respiratory: Negative.   Cardiovascular: Negative.   Gastrointestinal: Negative.   Genitourinary: Negative.   Musculoskeletal: Positive for back pain, gait problem and joint swelling.  Skin: Negative.   Neurological: Positive for weakness.  Psychiatric/Behavioral: Negative.     Vitals:   01/14/19 0855  BP: (!) 153/76  Pulse: 87  Resp: 18  Temp: (!) 97.2 F (36.2 C)  Weight: 128 lb (58.1 kg)  Height: 5\' 7"  (1.702 m)   Body mass index is 20.05 kg/m. Physical Exam Vitals signs reviewed.  Constitutional:      Appearance: Normal appearance.  HENT:     Head: Normocephalic.     Nose: Nose normal.     Mouth/Throat:     Mouth: Mucous membranes are moist.     Pharynx: Oropharynx is clear.  Eyes:     Pupils: Pupils are equal, round, and reactive to light.  Neck:     Musculoskeletal: Neck supple.  Cardiovascular:     Rate and Rhythm: Normal rate and regular rhythm.     Pulses: Normal pulses.     Heart sounds: Normal heart sounds.  Pulmonary:     Effort: Pulmonary effort is normal.     Breath sounds: Normal breath sounds.  Abdominal:     General: Abdomen is flat. Bowel sounds are normal.     Palpations: Abdomen is soft.  Musculoskeletal:     Comments: Left Leg Swelling mild  Skin:    General: Skin is warm and dry.  Neurological:     Mental Status: She is alert and oriented to person, place, and time.     Comments: Has mild weakness in left Leg     Labs reviewed: Basic Metabolic Panel: Recent Labs     01/06/19 1700 01/07/19 0513 01/08/19 0621 01/09/19 0618  NA 143 142 142 139  K 2.9* 3.2* 3.9 3.8  CL 104 102 101 99  CO2 29 33* 34* 31  GLUCOSE 152* 142* 114* 98  BUN 8 7* 13 15  CREATININE 0.66 0.61 0.77 0.74  CALCIUM 8.6* 8.1* 8.4* 8.3*  MG 1.7  --  1.6*  --    Liver Function Tests: Recent Labs    08/24/18 1316 12/30/18 1204 01/06/19 1700  AST 17 15 23   ALT 12 14 20   ALKPHOS 100 119* 90  BILITOT <0.2 0.2 0.4  PROT 6.3 5.9* 6.3*  ALBUMIN 4.2 3.7 3.6   No results for input(s): LIPASE, AMYLASE in the last 8760 hours. No results for input(s): AMMONIA in the last 8760 hours. CBC: Recent Labs    08/24/18 1316 12/30/18 1204 01/06/19 1700  01/10/19 0604 01/11/19 0930 01/12/19 0507  WBC 7.7 7.6 11.6*   < > 8.1 6.2 5.6  NEUTROABS 5.2 5.1 9.0*  --   --   --   --  HGB 11.6 11.0* 11.2*   < > 8.0* 7.3* 8.5*  HCT 36.9 34.3 37.9   < > 27.5* 24.9* 28.3*  MCV 91 90 95.9   < > 97.9 96.1 92.8  PLT 188 217 171   < > 141* 178 179   < > = values in this interval not displayed.   Cardiac Enzymes: No results for input(s): CKTOTAL, CKMB, CKMBINDEX, TROPONINI in the last 8760 hours. BNP: Invalid input(s): POCBNP Lab Results  Component Value Date   HGBA1C 5.3% 12/16/2013   Lab Results  Component Value Date   TSH 1.390 06/13/2016   Lab Results  Component Value Date   VITAMINB12 486 06/22/2018   Lab Results  Component Value Date   FOLATE >24.0 01/27/2018   Lab Results  Component Value Date   IRON 21 (L) 06/22/2018   TIBC 281 06/22/2018   FERRITIN 14 (L) 06/22/2018    Imaging and Procedures obtained prior to SNF admission: No results found.  Assessment/Plan  Intertrochanteric fracture of left femur S/P ORIF Will increase her Norco to Q4  Start her on Robaxin PRN  H/O Kyphoplasty Pain control with Norco Anxiety and depression On Lexapro and Ativan PRN Tobacco use disorder She says he is going to Quit now On Nicotine Patch  Osteoporosis, Was on Fosamax  before . Does not want to take it  GERD  On Protonix  Iron deficiency anemia S/P Transfusion  On high Dose of Iron Repeat CBC  Mouth Thrush On Nystatin COPD On Tamiflu prophylaxis from hospital Also on Bronchodilators   Family/ staff Communication:   Labs/tests ordered: Will Add CBC and Vit D to her labs Total time spent in this patient care encounter was 45_ minutes; greater than 50% of the visit spent counseling patient, reviewing records , Labs and coordinating care for problems addressed at this encounter.

## 2019-01-15 ENCOUNTER — Non-Acute Institutional Stay (SKILLED_NURSING_FACILITY): Payer: Medicare Other | Admitting: Adult Health

## 2019-01-15 ENCOUNTER — Encounter: Payer: Self-pay | Admitting: Adult Health

## 2019-01-15 DIAGNOSIS — S72142S Displaced intertrochanteric fracture of left femur, sequela: Secondary | ICD-10-CM

## 2019-01-15 NOTE — Progress Notes (Signed)
Location:   [place] Room Number: 119 P Place of Service:  SNF (31)   CODE STATUS: DNR  Allergies  Allergen Reactions  . Codeine Nausea And Vomiting  . Tramadol Nausea And Vomiting  . Asa [Aspirin] Other (See Comments)    Patient is unaware of allergy  . Azithromycin Other (See Comments)    Patient is unaware of allergy  . Celebrex [Celecoxib] Other (See Comments)    Irritated stomach   . Cymbalta [Duloxetine Hcl] Other (See Comments)    Felt groggy  . Prednisone Rash    She has seen the podiatrist and he had injected cortisone in her feet and she had also taken a peel at home. She had a severe reaction to her face with a rash and had to take Benadryl to resolve the symptom. She actually did get more cortisone shots, but no additional reactions to the shots.  . Vioxx [Rofecoxib] Other (See Comments)    Unknown  . Zelnorm [Tegaserod] Other (See Comments)    Patient is unaware of allergy  . Zocor [Simvastatin] Other (See Comments)    Patient is unaware of allergy    Chief Complaint  Patient presents with  . Acute Visit    Pain Management    HPI:  Staff reports that she has not done well in therapy over the past several days due to her pain. She tells me that her pain is better managed today. She was able to participate in therapy. She is able to move her legs in bed without pain. She denies any uncontrolled pain; no changes in appetite no insomnia.    Past Medical History:  Diagnosis Date  . Anxiety    takes Xanax daily  . Arthritis    back  . Cataract   . Chronic back pain   . Constipation    OTC stool softener prn  . COPD (chronic obstructive pulmonary disease) ([place])   . Depression   . Diverticulosis   . GERD (gastroesophageal reflux disease)    takes Ranidine daily  . Hemorrhoids   . History of bronchitis   . History of migraine    many yrs ago  . History of shingles   . Hyperlipidemia   . Insomnia   . Migraines   .  Nocturia   . Numbness    left leg  . Osteoporosis   . PONV (postoperative nausea and vomiting)   . Urinary frequency   . Urinary urgency     Past Surgical History:  Procedure Laterality Date  . ABDOMINAL HYSTERECTOMY    . COLONOSCOPY    . COLONOSCOPY N/A 02/17/2015   Procedure: COLONOSCOPY;  Surgeon: Rogene Houston, MD;  [facility];  Service: Endoscopy;  Laterality: N/A;  110n - moved to 11:15 - Ann to notify pt  . ESOPHAGOGASTRODUODENOSCOPY    . ESOPHAGOGASTRODUODENOSCOPY N/A 01/29/2016   Procedure: ESOPHAGOGASTRODUODENOSCOPY (EGD);  Surgeon: Rogene Houston, MD;  [facility];  Service: Endoscopy;  Laterality: N/A;  . ESOPHAGOGASTRODUODENOSCOPY N/A 10/14/2017   Procedure: ESOPHAGOGASTRODUODENOSCOPY (EGD);  Surgeon: Rogene Houston, MD;  [facility]: [facility];  Service: Endoscopy;  Laterality: N/A;  730  . EYE SURGERY Right 3/15   cataracts  . INTRAMEDULLARY (IM) NAIL INTERTROCHANTERIC Left 01/07/2019   Procedure: INTRAMEDULLARY (IM) NAIL INTERTROCHANTRIC;  Surgeon: Carole Civil, MD;  Location: [facility];  Service: Orthopedics;  Laterality: Left;  . KYPHOPLASTY N/A 07/08/2014   Procedure: Thoracic Eleven Kyphoplasty;  Surgeon: Hosie Spangle, MD;  Location: [place] NE[facility] ORS;  Service: Neurosurgery;  Laterality: N/A;  . LUMBAR LAMINECTOMY/DECOMPRESSION MICRODISCECTOMY Bilateral 05/20/2013   Procedure: LUMBAR LAMINECTOMY/DECOMPRESSION MICRODISCECTOMY 1 LEVEL;  Surgeon: Hosie Spangle, MD;  Location: [place] NE[facility] ORS;  Service: Neurosurgery;  Laterality: Bilateral;  Bilateral Lumbar four-five laminotomy and left lumbar four-five microdiskectomy  . SPINE SURGERY     Dr Carloyn Manner -  vertebraplasty    Social History   Socioeconomic History  . Marital status: Married    Spouse name: Sterling Big   . Number of children: Not on file  . Years of education: Not on file  . Highest education level: Not on file  Occupational History  . Occupation: retired     Comment: [place]  work - came out in [employer]  . Financial resource strain: Not on file  . Food insecurity:    Worry: Not on file    Inability: Not on file  . Transportation needs:    Medical: Not on file    Non-medical: Not on file  Tobacco Use  . Smoking status: Current Some Day Smoker    Packs/day: 1.00    Years: 52.00    Pack years: 52.00    Types: Cigarettes    Start date: 11/18/1958    Last attempt to quit: 08/20/2011    Years since quitting: 7.4  . Smokeless tobacco: Never Used  . Tobacco comment: 1/2-1pack off and on all her life  Substance and Sexual Activity  . Alcohol use: No    Alcohol/week: 0.0 standard drinks  . Drug use: No  . Sexual activity: Not Currently  Lifestyle  . Physical activity:    Days per week: Not on file    Minutes per session: Not on file  . Stress: Not on file  Relationships  . Social connections:    Talks on phone: Not on file    Gets together: Not on file    Attends religious service: Not on file    Active member of club or organization: Not on file    Attends meetings of clubs or organizations: Not on file    Relationship status: Not on file  . Intimate partner violence:    Fear of current or ex partner: Not on file    Emotionally abused: Not on file    Physically abused: Not on file    Forced sexual activity: Not on file  Other Topics Concern  . Not on file  Social History Narrative   Lives at home with husband, Sterling Big.   Had one son- now deceased    Family History  Problem Relation Age of Onset  . Hypertension Mother   . Macular degeneration Father   . Heart disease Father   . Cirrhosis Son   . Heart disease Son   . Colon cancer Neg Hx       VITAL SIGNS BP (!) 148/73   Pulse 87   Temp (!) 97.4 F (36.3 C)   Resp 18   Ht 5' 7"  (1.702 m)   Wt 127 lb 6.4 oz (57.8 kg)   BMI 19.95 kg/m   Outpatient Encounter Medications as of 01/15/2019  Medication Sig  . acetaminophen (TYLENOL) 500 MG tablet Take 500 mg by mouth every 6  (six) hours as needed for moderate pain or headache.   . albuterol (PROVENTIL HFA;VENTOLIN HFA) 108 (90 Base) MCG/ACT inhaler Inhale 2 puffs into the lungs every 6 (six) hours as needed for wheezing  or shortness of breath (cough).  . ALPRAZolam (XANAX) 0.5 MG tablet Take 1 tablet (0.5 mg total) by mouth 3 (three) times daily as needed for up to 14 days for anxiety.  Marland Kitchen aspirin EC 325 MG tablet Take 1 tablet (325 mg total) by mouth daily.  Roseanne Kaufman Peru-Castor Oil (VENELEX) OINT Apply topically to Sacrum and bilateral buttocks every shift and as needed for prevention  . cholecalciferol (VITAMIN D) 1000 UNITS tablet Take 2,000-4,000 Units by mouth daily. Take 2000 units daily except take 4000 units on Saturday and Sunday.  . docusate sodium (COLACE) 100 MG capsule Take 2 capsules (200 mg total) by mouth 2 (two) times daily.  Marland Kitchen escitalopram (LEXAPRO) 20 MG tablet Take 20 mg by mouth at bedtime.  . FeFum-FePoly-FA-B Cmp-C-Biot (INTEGRA PLUS) CAPS Take 1 capsule by mouth daily.  . ferrous sulfate (FEOSOL) 325 (65 FE) MG tablet Take 1 tablet (325 mg total) by mouth 3 (three) times daily with meals.  Marland Kitchen HYDROcodone-acetaminophen (NORCO) 10-325 MG tablet Take 1 tablet by mouth every 4 (four) hours as needed.  . Multiple Vitamin (MULTIVITAMIN WITH MINERALS) TABS tablet Take 1 tablet by mouth daily. Centrum Silver  . nicotine (NICODERM CQ - DOSED IN MG/24 HOURS) 21 mg/24hr patch Place 21 mg onto the skin daily. Change patch every 24 hours  . NON FORMULARY Diet Type: NAS  . Nutritional Supplements (ENSURE ENLIVE PO) Take 1 Bottle by mouth 2 (two) times daily between meals.  . nystatin (MYCOSTATIN) 100000 UNIT/ML suspension Take 5 mLs (500,000 Units total) by mouth 4 (four) times daily. Swish and spit  . oseltamivir (TAMIFLU) 30 MG capsule Take 1 capsule (30 mg total) by mouth daily. For 10 more days  . pantoprazole (PROTONIX) 40 MG tablet Take 1 tablet (40 mg total) by mouth 2 (two) times daily before a meal.    . vitamin B-12 (CYANOCOBALAMIN) 1000 MCG tablet Take 1,000 mcg by mouth daily.  . [DISCONTINUED] HYDROcodone-acetaminophen (NORCO) 10-325 MG tablet Take 1 tablet by mouth every 6 (six) hours as needed for up to 7 days for moderate pain or severe pain. (Patient not taking: Reported on 01/15/2019)   No facility-administered encounter medications on file as of 01/15/2019.      SIGNIFICANT DIAGNOSTIC EXAMS  PREVIOUS;   01-06-19: ct of head: 1. No acute intracranial abnormality or acute traumatic injury identified. 2. Mild to moderate cerebral white matter changes, most commonly due to chronic small vessel disease.  01-06-19: left hip and pelvic x-ray: Comminuted left femur intertrochanteric fracture with anterior displacement and mild varus impaction.  NO NEW EXAMS.   LABS REVIEWED PREVIOUS:   01-09-19: wbc 8.6; hgb 8.0; hct 27.2; mcv 97.5; plt 135; glucose 98; bun 15; creat 0.74; k+ 3.8; na++ 139; ca 8.3 01-12-19: wbc 5.6; hgb 85; hgb 28.3; mcv 92.;8 plt 179  NO NEW LABS.    Review of Systems  Constitutional: Negative for malaise/fatigue.  Respiratory: Negative for cough and shortness of breath.   Cardiovascular: Negative for chest pain, palpitations and leg swelling.  Gastrointestinal: Negative for abdominal pain, constipation and heartburn.  Musculoskeletal: Negative for back pain, joint pain and myalgias.  Skin: Negative.   Neurological: Negative for dizziness.  Psychiatric/Behavioral: The patient is not nervous/anxious.      Physical Exam Constitutional:      General: She is not in acute distress.    Appearance: She is well-developed. She is not diaphoretic.     Comments: Frail   Neck:     Thyroid:  No thyromegaly.  Cardiovascular:     Rate and Rhythm: Normal rate and regular rhythm.     Pulses: Normal pulses.     Heart sounds: Normal heart sounds.  Pulmonary:     Effort: Pulmonary effort is normal. No respiratory distress.     Breath sounds: Normal breath sounds.   Abdominal:     General: Bowel sounds are normal. There is no distension.     Palpations: Abdomen is soft.     Tenderness: There is no abdominal tenderness.  Musculoskeletal:     Right lower leg: No edema.     Left lower leg: No edema.     Comments:  Is able to move all extremities Is status post left hip fracture Has history of kyphoplasty    Lymphadenopathy:     Cervical: No cervical adenopathy.  Skin:    General: Skin is warm and dry.  Neurological:     Mental Status: She is alert and oriented to person, place, and time.  Psychiatric:        Mood and Affect: Mood normal.      ASSESSMENT/ PLAN:  TODAY;   1. Displaced intertochanteric fracture of left femur, sequela: is stable will continue therapy as directed and will follow up with orthopedics. At this time will continue her vicodin 5.325 mg every 6 hours as needed and will continue to monitor her status.     MD is aware of resident's narcotic use and is in agreement with current plan of care. We will attempt to wean resident as apropriate   Ok Edwards NP [facility]  Contact [phone] Monday through Friday 8am- 5pm  After hours call [phone]

## 2019-01-16 LAB — URINE CULTURE: Culture: 10000 — AB

## 2019-01-18 ENCOUNTER — Other Ambulatory Visit: Payer: Self-pay | Admitting: Adult Health

## 2019-01-18 ENCOUNTER — Encounter (HOSPITAL_COMMUNITY)
Admission: RE | Admit: 2019-01-18 | Discharge: 2019-01-18 | Disposition: A | Discharge status: Home / Self Care | Payer: Medicare Other | Source: Skilled Nursing Facility | Attending: Internal Medicine | Admitting: Internal Medicine

## 2019-01-18 DIAGNOSIS — R351 Nocturia: Secondary | ICD-10-CM | POA: Insufficient documentation

## 2019-01-18 DIAGNOSIS — S72141D Displaced intertrochanteric fracture of right femur, subsequent encounter for closed fracture with routine healing: Secondary | ICD-10-CM | POA: Insufficient documentation

## 2019-01-18 DIAGNOSIS — R3915 Urgency of urination: Secondary | ICD-10-CM | POA: Insufficient documentation

## 2019-01-18 DIAGNOSIS — Z4789 Encounter for other orthopedic aftercare: Secondary | ICD-10-CM | POA: Insufficient documentation

## 2019-01-18 LAB — CBC WITH DIFFERENTIAL/PLATELET
ABS IMMATURE GRANULOCYTES: 0.08 10*3/uL — AB (ref 0.00–0.07)
BASOS ABS: 0 10*3/uL (ref 0.0–0.1)
Basophils Relative: 1 %
EOS ABS: 0.6 10*3/uL — AB (ref 0.0–0.5)
Eosinophils Relative: 9 %
HCT: 33 % — ABNORMAL LOW (ref 36.0–46.0)
Hemoglobin: 9.9 g/dL — ABNORMAL LOW (ref 12.0–15.0)
IMMATURE GRANULOCYTES: 1 %
Lymphocytes Relative: 21 %
Lymphs Abs: 1.5 10*3/uL (ref 0.7–4.0)
MCH: 29.1 pg (ref 26.0–34.0)
MCHC: 30 g/dL (ref 30.0–36.0)
MCV: 97.1 fL (ref 80.0–100.0)
Monocytes Absolute: 0.6 10*3/uL (ref 0.1–1.0)
Monocytes Relative: 9 %
Neutro Abs: 4.1 10*3/uL (ref 1.7–7.7)
Neutrophils Relative %: 59 %
Platelets: 298 10*3/uL (ref 150–400)
RBC: 3.4 MIL/uL — ABNORMAL LOW (ref 3.87–5.11)
RDW: 18.3 % — ABNORMAL HIGH (ref 11.5–15.5)
WBC: 7 10*3/uL (ref 4.0–10.5)
nRBC: 0 % (ref 0.0–0.2)

## 2019-01-18 LAB — BASIC METABOLIC PANEL
Anion gap: 10 (ref 5–15)
BUN: 16 mg/dL (ref 8–23)
CO2: 31 mmol/L (ref 22–32)
Calcium: 8.3 mg/dL — ABNORMAL LOW (ref 8.9–10.3)
Chloride: 100 mmol/L (ref 98–111)
Creatinine, Ser: 0.69 mg/dL (ref 0.44–1.00)
GFR calc Af Amer: >60 mL/min (ref >60)
GFR calc non Af Amer: >60 mL/min (ref >60)
Glucose, Bld: 105 mg/dL — ABNORMAL HIGH (ref 70–99)
POTASSIUM: 3 mmol/L — AB (ref 3.5–5.1)
Sodium: 141 mmol/L (ref 135–145)

## 2019-01-18 MED ORDER — HYDROCODONE-ACETAMINOPHEN 10-325 MG PO TABS: 1.0000 | ORAL_TABLET | ORAL | 0 refills | Status: AC | PRN

## 2019-01-19 LAB — VITAMIN D 25 HYDROXY (VIT D DEFICIENCY, FRACTURES): Vit D, 25-Hydroxy: 32.5 ng/mL (ref 30.0–100.0)

## 2019-01-19 NOTE — Addendum Note (Signed)
Addendum  created 01/19/19 1133 by Franco Nones, CRNA   Intraprocedure Flowsheets edited

## 2019-01-20 ENCOUNTER — Non-Acute Institutional Stay (SKILLED_NURSING_FACILITY): Payer: Medicare Other | Admitting: Adult Health

## 2019-01-20 ENCOUNTER — Encounter (HOSPITAL_COMMUNITY)
Admission: RE | Admit: 2019-01-20 | Discharge: 2019-01-20 | Disposition: A | Discharge status: Home / Self Care | Payer: Medicare Other | Source: Skilled Nursing Facility | Attending: Internal Medicine | Admitting: Internal Medicine

## 2019-01-20 ENCOUNTER — Encounter: Payer: Self-pay | Admitting: Adult Health

## 2019-01-20 DIAGNOSIS — Z8781 Personal history of (healed) traumatic fracture: Secondary | ICD-10-CM

## 2019-01-20 DIAGNOSIS — Z9889 Other specified postprocedural states: Secondary | ICD-10-CM | POA: Diagnosis not present

## 2019-01-20 DIAGNOSIS — R351 Nocturia: Secondary | ICD-10-CM | POA: Insufficient documentation

## 2019-01-20 DIAGNOSIS — M4854XS Collapsed vertebra, not elsewhere classified, thoracic region, sequela of fracture: Secondary | ICD-10-CM

## 2019-01-20 DIAGNOSIS — S72142S Displaced intertrochanteric fracture of left femur, sequela: Secondary | ICD-10-CM

## 2019-01-20 DIAGNOSIS — Z4789 Encounter for other orthopedic aftercare: Secondary | ICD-10-CM | POA: Insufficient documentation

## 2019-01-20 DIAGNOSIS — R3915 Urgency of urination: Secondary | ICD-10-CM | POA: Insufficient documentation

## 2019-01-20 DIAGNOSIS — J8410 Pulmonary fibrosis, unspecified: Secondary | ICD-10-CM | POA: Diagnosis not present

## 2019-01-20 DIAGNOSIS — E871 Hypo-osmolality and hyponatremia: Secondary | ICD-10-CM | POA: Insufficient documentation

## 2019-01-20 LAB — POTASSIUM: Potassium: 4.3 mmol/L (ref 3.5–5.1)

## 2019-01-20 NOTE — Progress Notes (Signed)
Location:   [place] Room Number: 119 P Place of Service:  SNF (31)   CODE STATUS: DNR  Allergies  Allergen Reactions  . Codeine Nausea And Vomiting  . Tramadol Nausea And Vomiting  . Asa [Aspirin] Other (See Comments)    Patient is unaware of allergy  . Azithromycin Other (See Comments)    Patient is unaware of allergy  . Celebrex [Celecoxib] Other (See Comments)    Irritated stomach   . Cymbalta [Duloxetine Hcl] Other (See Comments)    Felt groggy  . Prednisone Rash    She has seen the podiatrist and he had injected cortisone in her feet and she had also taken a peel at home. She had a severe reaction to her face with a rash and had to take Benadryl to resolve the symptom. She actually did get more cortisone shots, but no additional reactions to the shots.  . Vioxx [Rofecoxib] Other (See Comments)    Unknown  . Zelnorm [Tegaserod] Other (See Comments)    Patient is unaware of allergy  . Zocor [Simvastatin] Other (See Comments)    Patient is unaware of allergy    Chief Complaint  Patient presents with  . Medical Management of Chronic Issues    Displaced intertrochanteric fracture of left femur, sequela; pulmonary fibrosis; non-traumatic  compression fracture of eleventh vertebrae sequela. Weekly follow up for the first 30 days post hospitalization.     HPI:  She is a 78 year old short term rehab patient being seen for the management of her chronic illnesses: left femur fracture; pulmonary fibrosis; compression fracture. She continues to have left hip pain describes as 9/10. She continues to participate in therapy. No reports of cough or shortness of breath; no changes in appetite.   Past Medical History:  Diagnosis Date  . Anxiety    takes Xanax daily  . Arthritis    back  . Cataract   . Chronic back pain   . Constipation    OTC stool softener prn  . COPD (chronic obstructive pulmonary disease) ([place])   . Depression   .  Diverticulosis   . GERD (gastroesophageal reflux disease)    takes Ranidine daily  . Hemorrhoids   . History of bronchitis   . History of migraine    many yrs ago  . History of shingles   . Hyperlipidemia   . Insomnia   . Migraines   . Nocturia   . Numbness    left leg  . Osteoporosis   . PONV (postoperative nausea and vomiting)   . Urinary frequency   . Urinary urgency     Past Surgical History:  Procedure Laterality Date  . ABDOMINAL HYSTERECTOMY    . COLONOSCOPY    . COLONOSCOPY N/A 02/17/2015   Procedure: COLONOSCOPY;  Surgeon: Rogene Houston, MD;  [facility];  Service: Endoscopy;  Laterality: N/A;  110n - moved to 11:15 - Ann to notify pt  . ESOPHAGOGASTRODUODENOSCOPY    . ESOPHAGOGASTRODUODENOSCOPY N/A 01/29/2016   Procedure: ESOPHAGOGASTRODUODENOSCOPY (EGD);  Surgeon: Rogene Houston, MD;  [facility];  Service: Endoscopy;  Laterality: N/A;  . ESOPHAGOGASTRODUODENOSCOPY N/A 10/14/2017   Procedure: ESOPHAGOGASTRODUODENOSCOPY (EGD);  Surgeon: Rogene Houston, MD;  [facility];  Service: Endoscopy;  Laterality: N/A;  730  . EYE SURGERY Right 3/15   cataracts  . INTRAMEDULLARY (IM) NAIL INTERTROCHANTERIC Left 01/07/2019   Procedure: INTRAMEDULLARY (IM) NAIL INTERTROCHANTRIC;  Surgeon: Arther Abbott  E, MD;  Location: [facility];  Service: Orthopedics;  Laterality: Left;  . KYPHOPLASTY N/A 07/08/2014   Procedure: Thoracic Eleven Kyphoplasty;  Surgeon: Hosie Spangle, MD;  Location: [place] [facility] ORS;  Service: Neurosurgery;  Laterality: N/A;  . LUMBAR LAMINECTOMY/DECOMPRESSION MICRODISCECTOMY Bilateral 05/20/2013   Procedure: LUMBAR LAMINECTOMY/DECOMPRESSION MICRODISCECTOMY 1 LEVEL;  Surgeon: Hosie Spangle, MD;  Location: [place] [facility] ORS;  Service: Neurosurgery;  Laterality: Bilateral;  Bilateral Lumbar four-five laminotomy and left lumbar four-five microdiskectomy  . SPINE SURGERY     Dr Carloyn Manner -  vertebraplasty    Social History    Socioeconomic History  . Marital status: Married    Spouse name: Sterling Big   . Number of children: Not on file  . Years of education: Not on file  . Highest education level: Not on file  Occupational History  . Occupation: retired     Comment: [place] work - came out in [employer]  . Financial resource strain: Not on file  . Food insecurity:    Worry: Not on file    Inability: Not on file  . Transportation needs:    Medical: Not on file    Non-medical: Not on file  Tobacco Use  . Smoking status: Current Some Day Smoker    Packs/day: 1.00    Years: 52.00    Pack years: 52.00    Types: Cigarettes    Start date: 11/18/1958    Last attempt to quit: 08/20/2011    Years since quitting: 7.4  . Smokeless tobacco: Never Used  . Tobacco comment: 1/2-1pack off and on all her life  Substance and Sexual Activity  . Alcohol use: No    Alcohol/week: 0.0 standard drinks  . Drug use: No  . Sexual activity: Not Currently  Lifestyle  . Physical activity:    Days per week: Not on file    Minutes per session: Not on file  . Stress: Not on file  Relationships  . Social connections:    Talks on phone: Not on file    Gets together: Not on file    Attends religious service: Not on file    Active member of club or organization: Not on file    Attends meetings of clubs or organizations: Not on file    Relationship status: Not on file  . Intimate partner violence:    Fear of current or ex partner: Not on file    Emotionally abused: Not on file    Physically abused: Not on file    Forced sexual activity: Not on file  Other Topics Concern  . Not on file  Social History Narrative   Lives at home with husband, Sterling Big.   Had one son- now deceased    Family History  Problem Relation Age of Onset  . Hypertension Mother   . Macular degeneration Father   . Heart disease Father   . Cirrhosis Son   . Heart disease Son   . Colon cancer Neg Hx       VITAL SIGNS BP 112/68   Pulse 60    Temp (!) 97.2 F (36.2 C)   Resp 17   Ht 5' 7"  (1.702 m)   Wt 128 lb 9.6 oz (58.3 kg)   BMI 20.14 kg/m   Outpatient Encounter Medications as of 01/20/2019  Medication Sig  . acetaminophen (TYLENOL) 500 MG tablet Take 500 mg by mouth every 6 (six) hours as needed for moderate pain or headache.   Marland Kitchen  albuterol (PROVENTIL HFA;VENTOLIN HFA) 108 (90 Base) MCG/ACT inhaler Inhale 2 puffs into the lungs every 6 (six) hours as needed for wheezing or shortness of breath (cough).  . ALPRAZolam (XANAX) 0.5 MG tablet Take 1 tablet (0.5 mg total) by mouth 3 (three) times daily as needed for up to 14 days for anxiety.  Marland Kitchen aspirin EC 325 MG tablet Take 1 tablet (325 mg total) by mouth daily.  Roseanne Kaufman Peru-Castor Oil (VENELEX) OINT Apply topically to Sacrum and bilateral buttocks every shift and as needed for prevention  . cholecalciferol (VITAMIN D) 1000 UNITS tablet Take 2,000-4,000 Units by mouth daily. Take 2000 units daily except take 4000 units on Saturday and Sunday.  . docusate sodium (COLACE) 100 MG capsule Take 2 capsules (200 mg total) by mouth 2 (two) times daily.  Marland Kitchen escitalopram (LEXAPRO) 20 MG tablet Take 20 mg by mouth at bedtime.  . FeFum-FePoly-FA-B Cmp-C-Biot (INTEGRA PLUS) CAPS Take 1 capsule by mouth daily.  . ferrous sulfate (FEOSOL) 325 (65 FE) MG tablet Take 1 tablet (325 mg total) by mouth 3 (three) times daily with meals.  Marland Kitchen HYDROcodone-acetaminophen (NORCO) 10-325 MG tablet Take 1 tablet by mouth every 4 (four) hours as needed for up to 2 days.  . Multiple Vitamin (MULTIVITAMIN WITH MINERALS) TABS tablet Take 1 tablet by mouth daily. Centrum Silver  . nicotine (NICODERM CQ - DOSED IN MG/24 HOURS) 21 mg/24hr patch Place 21 mg onto the skin daily. Change patch every 24 hours  . NON FORMULARY Diet Type: NAS  . Nutritional Supplements (ENSURE ENLIVE PO) Take 1 Bottle by mouth 2 (two) times daily between meals.  Marland Kitchen oseltamivir (TAMIFLU) 30 MG capsule Take 1 capsule (30 mg total) by mouth  daily. For 10 more days  . pantoprazole (PROTONIX) 40 MG tablet Take 1 tablet (40 mg total) by mouth 2 (two) times daily before a meal.  . vitamin B-12 (CYANOCOBALAMIN) 1000 MCG tablet Take 1,000 mcg by mouth daily.  . [DISCONTINUED] nystatin (MYCOSTATIN) 100000 UNIT/ML suspension Take 5 mLs (500,000 Units total) by mouth 4 (four) times daily. Swish and spit (Patient not taking: Reported on 01/20/2019)   No facility-administered encounter medications on file as of 01/20/2019.      SIGNIFICANT DIAGNOSTIC EXAMS   PREVIOUS;   01-06-19: ct of head: 1. No acute intracranial abnormality or acute traumatic injury identified. 2. Mild to moderate cerebral white matter changes, most commonly due to chronic small vessel disease.  01-06-19: left hip and pelvic x-ray: Comminuted left femur intertrochanteric fracture with anterior displacement and mild varus impaction.  NO NEW EXAMS.   LABS REVIEWED PREVIOUS:   01-09-19: wbc 8.6; hgb 8.0; hct 27.2; mcv 97.5; plt 135; glucose 98; bun 15; creat 0.74; k+ 3.8; na++ 139; ca 8.3 01-12-19: wbc 5.6; hgb 85; hgb 28.3; mcv 92.;8 plt 179  TODAY:   01-18-19: wbc 7.0; hgb 9.9 hct 33.0; mcv 97.1 plt 298; glucose 105; bun 16; creat 0.69; k+ 3.0; na++ 141; ca 8.3 vit D 32.5   Review of Systems  Constitutional: Negative for malaise/fatigue.  Respiratory: Negative for cough and shortness of breath.   Cardiovascular: Negative for chest pain, palpitations and leg swelling.  Gastrointestinal: Negative for abdominal pain, constipation and heartburn.  Musculoskeletal: Positive for joint pain. Negative for back pain and myalgias.       Left hip pain   Skin: Negative.   Neurological: Negative for dizziness.  Psychiatric/Behavioral: The patient is not nervous/anxious.      Physical Exam Constitutional:  General: She is not in acute distress.    Appearance: She is well-developed. She is not diaphoretic.     Comments: Frail   Neck:     Musculoskeletal: Neck  supple.     Thyroid: No thyromegaly.  Cardiovascular:     Rate and Rhythm: Normal rate and regular rhythm.     Pulses: Normal pulses.     Heart sounds: Normal heart sounds.  Pulmonary:     Effort: Pulmonary effort is normal. No respiratory distress.     Breath sounds: Normal breath sounds.  Abdominal:     General: Bowel sounds are normal. There is no distension.     Palpations: Abdomen is soft.     Tenderness: There is no abdominal tenderness.  Musculoskeletal:     Right lower leg: Edema present.     Left lower leg: Edema present.     Comments:  Is able to move all extremities Is status post left hip fracture Has history of kyphoplasty   2+ bilateral lower extremity edema   Lymphadenopathy:     Cervical: No cervical adenopathy.  Skin:    General: Skin is warm and dry.  Neurological:     Mental Status: She is alert and oriented to person, place, and time.      ASSESSMENT/ PLAN:  TODAY;   1. Displaced intertochanteric fracture of left femur sequela S/P IM nail left hipo 01-07-19: is without change; will continue therapy as directed and will continue to follow up with orthopedics. Will continue vicodin 10/325 mg every 6 hours as needed for pain; will begin lidocaine 4% to left hip area.   2. Nontraumatic compression fracture of T11 vertebrae: is stable will continue therapy as directed and will continue vicodin 10/325 mg every 6 hours as needed   3. Pulmonary fibrosis: is stable will continue albuterol 2 puffs every 6 hours as needed;   PREVIOUS   4. GERD without esophagitis: is stable will continue protonix 40 mg twice daily   5. Acute blood loss anemia: is stable hgb 89.9; will continue integra plus daily and iron three times daily  6. Constipation due to opioid therapy: is stable will continue colace 200 mg twice daily   7. Anxiety and depression: is stable will continue lexapro 20 mg daily and has xanax 0.5 mg three times daily as needed for 14 days.    MD is aware  of resident's narcotic use and is in agreement with current plan of care. We will attempt to wean resident as apropriate   Ok Edwards NP [facility]  Contact [phone] Monday through Friday 8am- 5pm  After hours call [phone]

## 2019-01-25 ENCOUNTER — Other Ambulatory Visit: Payer: Self-pay | Admitting: Adult Health

## 2019-01-25 MED ORDER — HYDROCODONE-ACETAMINOPHEN 10-325 MG PO TABS: 1.0000 | ORAL_TABLET | Freq: Three times a day (TID) | ORAL | 0 refills | Status: DC | PRN

## 2019-01-26 ENCOUNTER — Other Ambulatory Visit: Payer: Self-pay | Admitting: Adult Health

## 2019-01-26 DIAGNOSIS — Z9889 Other specified postprocedural states: Secondary | ICD-10-CM | POA: Insufficient documentation

## 2019-01-26 DIAGNOSIS — Z8781 Personal history of (healed) traumatic fracture: Principal | ICD-10-CM

## 2019-01-26 MED ORDER — ALPRAZOLAM 0.5 MG PO TABS: 0.5000 mg | ORAL_TABLET | Freq: Three times a day (TID) | ORAL | 0 refills | Status: DC | PRN

## 2019-01-27 ENCOUNTER — Inpatient Hospital Stay (INDEPENDENT_AMBULATORY_CARE_PROVIDER_SITE_OTHER): Payer: No Typology Code available for payment source

## 2019-01-27 ENCOUNTER — Encounter: Payer: Self-pay | Admitting: Adult Health

## 2019-01-27 ENCOUNTER — Other Ambulatory Visit: Payer: Self-pay

## 2019-01-27 ENCOUNTER — Ambulatory Visit (INDEPENDENT_AMBULATORY_CARE_PROVIDER_SITE_OTHER): Payer: Medicare Other | Admitting: Orthopedic Surgery

## 2019-01-27 DIAGNOSIS — Z8781 Personal history of (healed) traumatic fracture: Secondary | ICD-10-CM | POA: Diagnosis not present

## 2019-01-27 DIAGNOSIS — S72142D Displaced intertrochanteric fracture of left femur, subsequent encounter for closed fracture with routine healing: Secondary | ICD-10-CM | POA: Diagnosis not present

## 2019-01-27 DIAGNOSIS — Z9889 Other specified postprocedural states: Secondary | ICD-10-CM

## 2019-01-27 MED ORDER — ACETAMINOPHEN-CODEINE #3 300-30 MG PO TABS: 1.0000 | ORAL_TABLET | Freq: Four times a day (QID) | ORAL | 0 refills | Status: AC | PRN

## 2019-01-27 NOTE — Progress Notes (Signed)
Chief Complaint  Patient presents with  . Follow-up    Recheck on left hip, DOS 01-07-19.    01/07/2019  3:33 PM  PATIENT:  Misty [name]  78 y.o. female  PRE-OPERATIVE DIAGNOSIS:  left hip fracture  POST-OPERATIVE DIAGNOSIS:  left hip fracture  PROCEDURE:  Procedure(s): INTRAMEDULLARY (IM) NAIL INTERTROCHANTRIC (Left) 46047  SURGEON:  Surgeon(s) and Role:    Carole Civil, MD - Primary  Open treatment internal fixation of the hip with gamma nail  Implant Stryker 125 degree gamma nail with 90 mm lag screw 35 mm distal locking screw proximal acorn and sliding mode  Wound and incision looks good leg lengths look good  I placed her on Tylenol with codeine for pain for 1 week  Should be going home tomorrow   Current Outpatient Medications:  .  acetaminophen-codeine (TYLENOL #3) 300-30 MG tablet, Take 1 tablet by mouth every 6 (six) hours as needed for up to 7 days for moderate pain., Disp: 28 tablet, Rfl: 0 .  ALPRAZolam (XANAX) 0.5 MG tablet, Take 1 tablet (0.5 mg total) by mouth 3 (three) times daily as needed for up to 14 days for anxiety., Disp: 20 tablet, Rfl: 0

## 2019-01-27 NOTE — Progress Notes (Signed)
Entered in error

## 2019-01-28 ENCOUNTER — Encounter: Payer: Self-pay | Admitting: Adult Health

## 2019-01-28 ENCOUNTER — Non-Acute Institutional Stay (SKILLED_NURSING_FACILITY): Payer: Medicare Other | Admitting: Adult Health

## 2019-01-28 ENCOUNTER — Other Ambulatory Visit: Payer: Self-pay | Admitting: Adult Health

## 2019-01-28 DIAGNOSIS — T402X5A Adverse effect of other opioids, initial encounter: Secondary | ICD-10-CM

## 2019-01-28 DIAGNOSIS — S72142S Displaced intertrochanteric fracture of left femur, sequela: Secondary | ICD-10-CM

## 2019-01-28 DIAGNOSIS — K5903 Drug induced constipation: Secondary | ICD-10-CM | POA: Diagnosis not present

## 2019-01-28 DIAGNOSIS — K2100 Gastro-esophageal reflux disease with esophagitis, without bleeding: Secondary | ICD-10-CM

## 2019-01-28 DIAGNOSIS — M4854XS Collapsed vertebra, not elsewhere classified, thoracic region, sequela of fracture: Secondary | ICD-10-CM | POA: Diagnosis not present

## 2019-01-28 MED ORDER — ESCITALOPRAM OXALATE 20 MG PO TABS: 20.0000 mg | ORAL_TABLET | Freq: Every day | ORAL | 0 refills | Status: DC

## 2019-01-28 MED ORDER — FERROUS SULFATE 325 (65 FE) MG PO TABS: 325.0000 mg | ORAL_TABLET | Freq: Three times a day (TID) | ORAL | 0 refills | Status: DC

## 2019-01-28 MED ORDER — INTEGRA PLUS PO CAPS: 1.0000 | ORAL_CAPSULE | Freq: Every day | ORAL | 0 refills | Status: DC

## 2019-01-28 MED ORDER — PANTOPRAZOLE SODIUM 40 MG PO TBEC: 40.0000 mg | DELAYED_RELEASE_TABLET | Freq: Two times a day (BID) | ORAL | 0 refills | Status: DC

## 2019-01-28 MED ORDER — ALPRAZOLAM 0.5 MG PO TABS: 0.5000 mg | ORAL_TABLET | Freq: Three times a day (TID) | ORAL | 0 refills | Status: AC | PRN

## 2019-01-28 MED ORDER — ALBUTEROL SULFATE HFA 108 (90 BASE) MCG/ACT IN AERS: 2.0000 | INHALATION_SPRAY | Freq: Four times a day (QID) | RESPIRATORY_TRACT | 0 refills | Status: DC | PRN

## 2019-01-28 NOTE — Progress Notes (Signed)
.  Location:    [facility] Room Number: 119 P Place of Service:  SNF (31)    CODE STATUS: DNR  Allergies  Allergen Reactions  . Codeine Nausea And Vomiting  . Tramadol Nausea And Vomiting  . Asa [Aspirin] Other (See Comments)    Patient is unaware of allergy  . Azithromycin Other (See Comments)    Patient is unaware of allergy  . Celebrex [Celecoxib] Other (See Comments)    Irritated stomach   . Cymbalta [Duloxetine Hcl] Other (See Comments)    Felt groggy  . Prednisone Rash    She has seen the podiatrist and he had injected cortisone in her feet and she had also taken a peel at home. She had a severe reaction to her face with a rash and had to take Benadryl to resolve the symptom. She actually did get more cortisone shots, but no additional reactions to the shots.  . Vioxx [Rofecoxib] Other (See Comments)    Unknown  . Zelnorm [Tegaserod] Other (See Comments)    Patient is unaware of allergy  . Zocor [Simvastatin] Other (See Comments)    Patient is unaware of allergy    Chief Complaint  Patient presents with  . Discharge Note    Discharging to home on 01/28/2019    HPI:  She is being discharged to home with home health for pt/ot. She will need a front wheel walker. She will need her prescriptions written. She has received for her orthopedic a prescription for tylenol #3; she will not receive further pain medication from me upon her discharge. She had been hospitalized for a left femur fracture; she was admitted to this facility for short term rehab and is now ready to complete her therapy on a home health basis.    Past Medical History:  Diagnosis Date  . Anxiety    takes Xanax daily  . Arthritis    back  . Cataract   . Chronic back pain   . Constipation    OTC stool softener prn  . COPD (chronic obstructive pulmonary disease) ([place])   . Depression   . Diverticulosis   . GERD (gastroesophageal reflux disease)    takes Ranidine daily   . Hemorrhoids   . History of bronchitis   . History of migraine    many yrs ago  . History of shingles   . Hyperlipidemia   . Insomnia   . Migraines   . Nocturia   . Numbness    left leg  . Osteoporosis   . PONV (postoperative nausea and vomiting)   . Urinary frequency   . Urinary urgency     Past Surgical History:  Procedure Laterality Date  . ABDOMINAL HYSTERECTOMY    . COLONOSCOPY    . COLONOSCOPY N/A 02/17/2015   Procedure: COLONOSCOPY;  Surgeon: Rogene Houston, MD;  [facility]: [facility];  Service: Endoscopy;  Laterality: N/A;  110n - moved to 11:15 - Ann to notify pt  . ESOPHAGOGASTRODUODENOSCOPY    . ESOPHAGOGASTRODUODENOSCOPY N/A 01/29/2016   Procedure: ESOPHAGOGASTRODUODENOSCOPY (EGD);  Surgeon: Rogene Houston, MD;  [facility];  Service: Endoscopy;  Laterality: N/A;  . ESOPHAGOGASTRODUODENOSCOPY N/A 10/14/2017   Procedure: ESOPHAGOGASTRODUODENOSCOPY (EGD);  Surgeon: Rogene Houston, MD;  Location: [facility];  Service: Endoscopy;  Laterality: N/A;  730  . EYE SURGERY Right 3/15   cataracts  . INTRAMEDULLARY (IM) NAIL INTERTROCHANTERIC Left 01/07/2019   Procedure: INTRAMEDULLARY (IM) NAIL INTERTROCHANTRIC;  Surgeon: Carole Civil, MD;  Location: [facility]S;  Service: Orthopedics;  Laterality: Left;  . KYPHOPLASTY N/A 07/08/2014   Procedure: Thoracic Eleven Kyphoplasty;  Surgeon: Hosie Spangle, MD;  Location: [facility] ORS;  Service: Neurosurgery;  Laterality: N/A;  . LUMBAR LAMINECTOMY/DECOMPRESSION MICRODISCECTOMY Bilateral 05/20/2013   Procedure: LUMBAR LAMINECTOMY/DECOMPRESSION MICRODISCECTOMY 1 LEVEL;  Surgeon: Hosie Spangle, MD;  Location: [facility];  Service: Neurosurgery;  Laterality: Bilateral;  Bilateral Lumbar four-five laminotomy and left lumbar four-five microdiskectomy  . SPINE SURGERY     Dr Carloyn Manner -  vertebraplasty    Social History   Socioeconomic History  . Marital status: Married    Spouse name: Sterling Big   . Number of  children: Not on file  . Years of education: Not on file  . Highest education level: Not on file  Occupational History  . Occupation: retired     Comment: [employer] work - came out in [employer]  . Financial resource strain: Not on file  . Food insecurity:    Worry: Not on file    Inability: Not on file  . Transportation needs:    Medical: Not on file    Non-medical: Not on file  Tobacco Use  . Smoking status: Current Some Day Smoker    Packs/day: 1.00    Years: 52.00    Pack years: 52.00    Types: Cigarettes    Start date: 11/18/1958    Last attempt to quit: 08/20/2011    Years since quitting: 7.4  . Smokeless tobacco: Never Used  . Tobacco comment: 1/2-1pack off and on all her life  Substance and Sexual Activity  . Alcohol use: No    Alcohol/week: 0.0 standard drinks  . Drug use: No  . Sexual activity: Not Currently  Lifestyle  . Physical activity:    Days per week: Not on file    Minutes per session: Not on file  . Stress: Not on file  Relationships  . Social connections:    Talks on phone: Not on file    Gets together: Not on file    Attends religious service: Not on file    Active member of club or organization: Not on file    Attends meetings of clubs or organizations: Not on file    Relationship status: Not on file  . Intimate partner violence:    Fear of current or ex partner: Not on file    Emotionally abused: Not on file    Physically abused: Not on file    Forced sexual activity: Not on file  Other Topics Concern  . Not on file  Social History Narrative   Lives at home with husband, Sterling Big.   Had one son- now deceased    Family History  Problem Relation Age of Onset  . Hypertension Mother   . Macular degeneration Father   . Heart disease Father   . Cirrhosis Son   . Heart disease Son   . Colon cancer Neg Hx     VITAL SIGNS BP 132/74   Pulse 99   Temp (!) 97.3 F (36.3 C)   Resp 20   Ht 5' 7"  (1.702 m)   Wt 127 lb (57.6 kg)   BMI  19.89 kg/m   Patient's Medications  New Prescriptions   No medications on file  Previous Medications   ACETAMINOPHEN (TYLENOL) 500 MG TABLET    Take 500 mg by mouth every 8 (eight) hours.  ACETAMINOPHEN-CODEINE (TYLENOL #3) 300-30 MG TABLET    Take 1 tablet by mouth every 6 (six) hours as needed for up to 7 days for moderate pain.   ALBUTEROL (PROVENTIL HFA;VENTOLIN HFA) 108 (90 BASE) MCG/ACT INHALER    Inhale 2 puffs into the lungs every 6 (six) hours as needed for wheezing or shortness of breath (cough).   ALPRAZOLAM (XANAX) 0.5 MG TABLET    Take 1 tablet (0.5 mg total) by mouth 3 (three) times daily as needed for up to 14 days for anxiety.   ASPIRIN EC 325 MG TABLET    Take 1 tablet (325 mg total) by mouth daily.   BALSAM PERU-CASTOR OIL (VENELEX) OINT    Apply topically to Sacrum and bilateral buttocks every shift and as needed for prevention   CHOLECALCIFEROL (VITAMIN D) 1000 UNITS TABLET    Take 2,000-4,000 Units by mouth daily. Take 2000 units daily except take 4000 units on Saturday and Sunday.   DOCUSATE SODIUM (COLACE) 100 MG CAPSULE    Take 2 capsules (200 mg total) by mouth 2 (two) times daily.   ESCITALOPRAM (LEXAPRO) 20 MG TABLET    Take 20 mg by mouth at bedtime.   FEFUM-FEPOLY-FA-B CMP-C-BIOT (INTEGRA PLUS) CAPS    Take 1 capsule by mouth daily.   FERROUS SULFATE (FEOSOL) 325 (65 FE) MG TABLET    Take 1 tablet (325 mg total) by mouth 3 (three) times daily with meals.   LIDOCAINE 4 % PTCH    Apply 1 patch to left hip and 2nd patch to lumbar back and remove at HS for pain management   MULTIPLE VITAMIN (MULTIVITAMIN WITH MINERALS) TABS TABLET    Take 1 tablet by mouth daily. Centrum Silver   NICOTINE (NICODERM CQ - DOSED IN MG/24 HOURS) 21 MG/24HR PATCH    Place 21 mg onto the skin daily. Change patch every 24 hours   NON FORMULARY    Diet Type: NAS   NUTRITIONAL SUPPLEMENTS (ENSURE ENLIVE PO)    Take 1 Bottle by mouth 2 (two) times daily between meals.   PANTOPRAZOLE (PROTONIX)  40 MG TABLET    Take 1 tablet (40 mg total) by mouth 2 (two) times daily before a meal.   VITAMIN B-12 (CYANOCOBALAMIN) 1000 MCG TABLET    Take 1,000 mcg by mouth daily.  Modified Medications   No medications on file  Discontinued Medications   HYDROCODONE-ACETAMINOPHEN (NORCO) 10-325 MG TABLET    Take 1 tablet by mouth every 4 (four) hours as needed.     SIGNIFICANT DIAGNOSTIC EXAMS  PREVIOUS;   01-06-19: ct of head: 1. No acute intracranial abnormality or acute traumatic injury identified. 2. Mild to moderate cerebral white matter changes, most commonly due to chronic small vessel disease.  01-06-19: left hip and pelvic x-ray: Comminuted left femur intertrochanteric fracture with anterior displacement and mild varus impaction.  NO NEW EXAMS.   LABS REVIEWED PREVIOUS:   01-09-19: wbc 8.6; hgb 8.0; hct 27.2; mcv 97.5; plt 135; glucose 98; bun 15; creat 0.74; k+ 3.8; na++ 139; ca 8.3 01-12-19: wbc 5.6; hgb 85; hgb 28.3; mcv 92.;8 plt 179 01-18-19: wbc 7.0; hgb 9.9 hct 33.0; mcv 97.1 plt 298; glucose 105; bun 16; creat 0.69; k+ 3.0; na++ 141; ca 8.3 vit D 32.5  NO NEW LABS.     Review of Systems  Constitutional: Negative for malaise/fatigue.  Respiratory: Negative for cough and shortness of breath.   Cardiovascular: Negative for chest pain, palpitations and leg swelling.  Gastrointestinal: Negative  for abdominal pain, constipation and heartburn.  Musculoskeletal: Negative for back pain, joint pain and myalgias.  Skin: Negative.   Neurological: Negative for dizziness.  Psychiatric/Behavioral: The patient is not nervous/anxious.      Physical Exam Constitutional:      General: She is not in acute distress.    Appearance: She is well-developed. She is not diaphoretic.     Comments: Frail   Neck:     Thyroid: No thyromegaly.  Cardiovascular:     Rate and Rhythm: Normal rate and regular rhythm.     Heart sounds: Normal heart sounds.  Pulmonary:     Effort: Pulmonary effort is  normal. No respiratory distress.     Breath sounds: Normal breath sounds.  Abdominal:     General: Bowel sounds are normal. There is no distension.     Palpations: Abdomen is soft.     Tenderness: There is no abdominal tenderness.  Musculoskeletal:     Right lower leg: Edema present.     Left lower leg: Edema present.     Comments: Is able to move all extremities Is status post left hip fracture Has history of kyphoplasty   1+ bilateral lower extremity edema    Lymphadenopathy:     Cervical: No cervical adenopathy.  Skin:    General: Skin is warm and dry.  Neurological:     Mental Status: She is alert and oriented to person, place, and time.     ASSESSMENT/ PLAN:  Patient is being discharged with the following home health services:  Pt/ot: to evaluate and treat as indicated for gait balance strength adl training.   Patient is being discharged with the following durable medical equipment:  Front wheel walker in order for her to maintain her current level of independence with her adls.   Patient has been advised to f/u with their PCP in 1-2 weeks to bring them up to date on their rehab stay.  Social services at facility was responsible for arranging this appointment.  Pt was provided with a 30 day supply of prescriptions for medications and refills must be obtained from their PCP.  For controlled substances, a more limited supply may be provided adequate until PCP appointment only.  A 30 day supply of her prescription medications have been sent to [facility]  Time spent with patient and family: 35 minutes: discussed home health needs; dme and medications verbalized understanding.    Ok Edwards NP [facility]  Contact [phone] Monday through Friday 8am- 5pm  After hours call [phone]

## 2019-02-01 ENCOUNTER — Telehealth: Payer: Self-pay | Admitting: Orthopedic Surgery

## 2019-02-01 ENCOUNTER — Other Ambulatory Visit: Payer: Self-pay | Admitting: Orthopedic Surgery

## 2019-02-01 ENCOUNTER — Encounter: Payer: Self-pay | Admitting: Orthopedic Surgery

## 2019-02-01 DIAGNOSIS — S72142D Displaced intertrochanteric fracture of left femur, subsequent encounter for closed fracture with routine healing: Secondary | ICD-10-CM | POA: Diagnosis not present

## 2019-02-01 DIAGNOSIS — Z8781 Personal history of (healed) traumatic fracture: Principal | ICD-10-CM

## 2019-02-01 DIAGNOSIS — Z9889 Other specified postprocedural states: Secondary | ICD-10-CM

## 2019-02-01 DIAGNOSIS — W19XXXD Unspecified fall, subsequent encounter: Secondary | ICD-10-CM | POA: Diagnosis not present

## 2019-02-01 MED ORDER — HYDROCODONE-ACETAMINOPHEN 5-325 MG PO TABS: 1.0000 | ORAL_TABLET | Freq: Three times a day (TID) | ORAL | 0 refills | Status: DC | PRN

## 2019-02-01 NOTE — Telephone Encounter (Signed)
Misty [name] called and stated that the Tylenol #3 is making her throw up.  She said she wants Hydrocodone because she was taking that before and it didn't make her sick.  She states she uses [facility]

## 2019-02-01 NOTE — Telephone Encounter (Signed)
Wants prescription/order for wheelchair sent to [facility] She had hip surgery on 01/07/19  Thanks

## 2019-02-03 ENCOUNTER — Ambulatory Visit: Payer: Medicare Other | Admitting: Family Medicine

## 2019-02-04 DIAGNOSIS — S72142D Displaced intertrochanteric fracture of left femur, subsequent encounter for closed fracture with routine healing: Secondary | ICD-10-CM | POA: Diagnosis not present

## 2019-02-04 DIAGNOSIS — W19XXXD Unspecified fall, subsequent encounter: Secondary | ICD-10-CM | POA: Diagnosis not present

## 2019-02-06 ENCOUNTER — Other Ambulatory Visit: Payer: Self-pay | Admitting: Orthopedic Surgery

## 2019-02-06 DIAGNOSIS — Z8781 Personal history of (healed) traumatic fracture: Principal | ICD-10-CM

## 2019-02-06 DIAGNOSIS — Z9889 Other specified postprocedural states: Secondary | ICD-10-CM

## 2019-02-08 DIAGNOSIS — S72142D Displaced intertrochanteric fracture of left femur, subsequent encounter for closed fracture with routine healing: Secondary | ICD-10-CM | POA: Diagnosis not present

## 2019-02-08 DIAGNOSIS — W19XXXD Unspecified fall, subsequent encounter: Secondary | ICD-10-CM | POA: Diagnosis not present

## 2019-02-10 ENCOUNTER — Other Ambulatory Visit: Payer: Self-pay

## 2019-02-10 ENCOUNTER — Ambulatory Visit (INDEPENDENT_AMBULATORY_CARE_PROVIDER_SITE_OTHER): Payer: Medicare Other

## 2019-02-10 DIAGNOSIS — W19XXXD Unspecified fall, subsequent encounter: Secondary | ICD-10-CM

## 2019-02-10 DIAGNOSIS — S72142D Displaced intertrochanteric fracture of left femur, subsequent encounter for closed fracture with routine healing: Secondary | ICD-10-CM | POA: Diagnosis not present

## 2019-02-11 ENCOUNTER — Telehealth: Payer: Self-pay | Admitting: *Deleted

## 2019-02-11 NOTE — Telephone Encounter (Signed)
Pt requesting cream for buttocks and diaper area Pt has itchy rash Has tried OTC but is not helping Please advise

## 2019-02-11 NOTE — Telephone Encounter (Signed)
Have you seen this? Any description? Picture? Nystatin powder or mycolog cream to apply sparingly bid  to affected area of skin. Make sure that she uses soaps fabric softeners or detergents that are scent free

## 2019-02-12 MED ORDER — NYSTATIN-TRIAMCINOLONE 100000-0.1 UNIT/GM-% EX OINT: 1.0000 "application " | TOPICAL_OINTMENT | Freq: Two times a day (BID) | CUTANEOUS | 0 refills | Status: DC

## 2019-02-12 NOTE — Telephone Encounter (Signed)
Pt notified of RX Okayed per Dr Moore 

## 2019-02-15 ENCOUNTER — Other Ambulatory Visit: Payer: Self-pay | Admitting: Orthopedic Surgery

## 2019-02-15 DIAGNOSIS — S72142D Displaced intertrochanteric fracture of left femur, subsequent encounter for closed fracture with routine healing: Secondary | ICD-10-CM | POA: Diagnosis not present

## 2019-02-15 DIAGNOSIS — W19XXXD Unspecified fall, subsequent encounter: Secondary | ICD-10-CM | POA: Diagnosis not present

## 2019-02-15 DIAGNOSIS — Z8781 Personal history of (healed) traumatic fracture: Principal | ICD-10-CM

## 2019-02-15 DIAGNOSIS — Z9889 Other specified postprocedural states: Secondary | ICD-10-CM

## 2019-02-15 MED ORDER — HYDROCODONE-ACETAMINOPHEN 5-325 MG PO TABS: ORAL_TABLET | ORAL | 0 refills | Status: DC

## 2019-02-15 NOTE — Telephone Encounter (Signed)
Richardson Landry from [facility] called requesting a refill:  Hydrocodone-Acetaminophen  5/325 mg  Qty 21 Tablets  Take 1 tablet by mouth every 8 hours as needed for up to 7 days for moderate pain.  PATIENT USES [facility]

## 2019-02-17 DIAGNOSIS — W19XXXD Unspecified fall, subsequent encounter: Secondary | ICD-10-CM | POA: Diagnosis not present

## 2019-02-17 DIAGNOSIS — S72142D Displaced intertrochanteric fracture of left femur, subsequent encounter for closed fracture with routine healing: Secondary | ICD-10-CM | POA: Diagnosis not present

## 2019-02-22 ENCOUNTER — Telehealth: Payer: Self-pay | Admitting: Family Medicine

## 2019-02-22 ENCOUNTER — Telehealth: Payer: Self-pay | Admitting: Orthopedic Surgery

## 2019-02-22 ENCOUNTER — Other Ambulatory Visit: Payer: Self-pay | Admitting: Orthopedic Surgery

## 2019-02-22 ENCOUNTER — Other Ambulatory Visit: Payer: Self-pay | Admitting: *Deleted

## 2019-02-22 DIAGNOSIS — Z8781 Personal history of (healed) traumatic fracture: Principal | ICD-10-CM

## 2019-02-22 DIAGNOSIS — S72142D Displaced intertrochanteric fracture of left femur, subsequent encounter for closed fracture with routine healing: Secondary | ICD-10-CM | POA: Diagnosis not present

## 2019-02-22 DIAGNOSIS — L089 Local infection of the skin and subcutaneous tissue, unspecified: Secondary | ICD-10-CM

## 2019-02-22 DIAGNOSIS — Z9889 Other specified postprocedural states: Secondary | ICD-10-CM

## 2019-02-22 DIAGNOSIS — W19XXXD Unspecified fall, subsequent encounter: Secondary | ICD-10-CM | POA: Diagnosis not present

## 2019-02-22 MED ORDER — CEPHALEXIN 500 MG PO CAPS: 500.0000 mg | ORAL_CAPSULE | Freq: Three times a day (TID) | ORAL | 0 refills | Status: DC

## 2019-02-22 NOTE — Telephone Encounter (Signed)
Called back to patient; relayed.

## 2019-02-22 NOTE — Telephone Encounter (Signed)
This patient has a long history of diarrhea related problems with her stomach.  I hesitate on using a lot of antibiotics because of this.  See if home health nurse can get a wound culture and sensitivity and go ahead and start Keflex 500 mg 1 3 times daily with food for 10 days.  If she starts this medicine and the loose stools get worse she should discontinue it.  If we can get a picture of the apparent infection, that would be great.  Maybe Zigmund Daniel could go back and look at it.

## 2019-02-22 NOTE — Telephone Encounter (Signed)
Spoke to [place] with home health- states that patient just showed her a wound on the top of her left foot that looks like it is getting infected. Patient states that is has been there for awhile and started off as a scratch.  Patient has apt with surgeon for follow up on hip Wed. Wanting to know if patient ntbs or can wait till wed to show surgeon?   HR- 130 BP- 150/70

## 2019-02-22 NOTE — Telephone Encounter (Signed)
LM for Misty [name] - to try and get a wound culture.  Pt aware of antibiotic and med sent in

## 2019-02-22 NOTE — Telephone Encounter (Signed)
Patient requests refill:  HYDROcodone-acetaminophen (NORCO/VICODIN) 5-325 MG tablet 21 tablet 0 02/15/2019  [facility] care   - patient is aware of appointment scheduled Wednesday, 02/24/19.

## 2019-02-22 NOTE — Telephone Encounter (Signed)
wednes it will be filled

## 2019-02-22 NOTE — Telephone Encounter (Signed)
Red and heat there - possibly a little streaking. slight edema   [employer].  Can she have a antibiotic and will follow up with surgeon this week.

## 2019-02-22 NOTE — Telephone Encounter (Signed)
Request received via fax from [facility], [phone], for medication refill:   HYDROcodone-acetaminophen (NORCO/VICODIN) 5-325 MG tablet 21 tablet   *Patient has appointment scheduled for appointment this Wednesday, 02/24/19.

## 2019-02-23 ENCOUNTER — Other Ambulatory Visit: Payer: Self-pay

## 2019-02-23 ENCOUNTER — Ambulatory Visit: Payer: Medicare Other | Admitting: Family Medicine

## 2019-02-23 ENCOUNTER — Other Ambulatory Visit: Payer: Medicare Other

## 2019-02-23 DIAGNOSIS — L089 Local infection of the skin and subcutaneous tissue, unspecified: Secondary | ICD-10-CM | POA: Diagnosis not present

## 2019-02-23 DIAGNOSIS — S72142D Displaced intertrochanteric fracture of left femur, subsequent encounter for closed fracture with routine healing: Secondary | ICD-10-CM | POA: Diagnosis not present

## 2019-02-23 DIAGNOSIS — W19XXXD Unspecified fall, subsequent encounter: Secondary | ICD-10-CM | POA: Diagnosis not present

## 2019-02-24 ENCOUNTER — Other Ambulatory Visit: Payer: Self-pay

## 2019-02-24 ENCOUNTER — Ambulatory Visit (INDEPENDENT_AMBULATORY_CARE_PROVIDER_SITE_OTHER): Payer: Medicare Other | Admitting: Orthopedic Surgery

## 2019-02-24 ENCOUNTER — Ambulatory Visit (INDEPENDENT_AMBULATORY_CARE_PROVIDER_SITE_OTHER): Payer: Medicare Other

## 2019-02-24 ENCOUNTER — Encounter: Payer: Self-pay | Admitting: Orthopedic Surgery

## 2019-02-24 VITALS — Ht 67.0 in | Wt 127.0 lb

## 2019-02-24 DIAGNOSIS — M79605 Pain in left leg: Secondary | ICD-10-CM

## 2019-02-24 DIAGNOSIS — S72142D Displaced intertrochanteric fracture of left femur, subsequent encounter for closed fracture with routine healing: Secondary | ICD-10-CM

## 2019-02-24 DIAGNOSIS — M545 Low back pain, unspecified: Secondary | ICD-10-CM

## 2019-02-24 DIAGNOSIS — Z8781 Personal history of (healed) traumatic fracture: Secondary | ICD-10-CM | POA: Diagnosis not present

## 2019-02-24 DIAGNOSIS — Z9889 Other specified postprocedural states: Secondary | ICD-10-CM

## 2019-02-24 DIAGNOSIS — S90812A Abrasion, left foot, initial encounter: Secondary | ICD-10-CM

## 2019-02-24 MED ORDER — HYDROCODONE-ACETAMINOPHEN 5-325 MG PO TABS: ORAL_TABLET | ORAL | 0 refills | Status: DC

## 2019-02-24 NOTE — Addendum Note (Signed)
Addended by: Arther Abbott E on: 02/24/2019 11:46 AM   Modules accepted: Orders

## 2019-02-24 NOTE — Patient Instructions (Signed)
See or call Dr Carloyn Manner for back pain

## 2019-02-24 NOTE — Progress Notes (Signed)
As recommended by Dr. Aline Brochure, please do referral to podiatrist and to neurosurgeon that is taking over Dr. Rex Kras patients.  May want to consider Dr. Maryjean Ka with neurosurgery in [place].

## 2019-02-24 NOTE — Progress Notes (Addendum)
Chief Complaint  Patient presents with  . Routine [name] Op    01/07/19 left hip fracture IM nail patient moving well, c/o back pain, had back surgery by Dr Misty [name] in January     Several problems for Misty [name]  #1 her left hip fracture which she is here for follow-up in 6 weeks x-ray looks good Complains of pain down the left leg  Number two 1-1/2-week history of back pain status [name] lumbar fusion Dr. Carloyn [name] who is probably retired  #3 appear to develop abrasion and cellulitis left foot, Misty [name] called in Misty [name].  Seems to be helping   Left hip continue physical therapy x-ray in 6 weeks  Back pain please call Misty [name] office and see if a follow-up appointment or referral can be made to whoever is taking over his practice  #3 continue with Misty [name] regarding cellulitis if necessary podiatry can follow-up   X-ray in 6 weeks  Encounter Diagnosis  Name Primary?  . S/P IM nail left hip 01/07/2019 Yes   No orders of the defined types were placed in this encounter.

## 2019-02-25 DIAGNOSIS — W19XXXD Unspecified fall, subsequent encounter: Secondary | ICD-10-CM | POA: Diagnosis not present

## 2019-02-25 DIAGNOSIS — S72142D Displaced intertrochanteric fracture of left femur, subsequent encounter for closed fracture with routine healing: Secondary | ICD-10-CM | POA: Diagnosis not present

## 2019-02-25 NOTE — Addendum Note (Signed)
Addended by: Marin Olp on: 02/25/2019 03:51 PM   Modules accepted: Orders

## 2019-02-27 LAB — ANAEROBIC AND AEROBIC CULTURE

## 2019-03-02 ENCOUNTER — Telehealth: Payer: Self-pay | Admitting: *Deleted

## 2019-03-02 NOTE — Telephone Encounter (Signed)
Pt notified of results Verbalizes understanding 

## 2019-03-02 NOTE — Telephone Encounter (Signed)
-----   Message from Chipper Herb, MD sent at 02/28/2019 10:31 PM EDT ----- There is no growth of bacterial from either the anaerobic or aerobic culture from the patient's leg wound.  She should continue and complete the antibiotic that has been started and question with her if there is improvement in the wound.

## 2019-03-03 ENCOUNTER — Other Ambulatory Visit: Payer: Self-pay | Admitting: Orthopedic Surgery

## 2019-03-03 DIAGNOSIS — Z8781 Personal history of (healed) traumatic fracture: Principal | ICD-10-CM

## 2019-03-03 DIAGNOSIS — Z9889 Other specified postprocedural states: Secondary | ICD-10-CM

## 2019-03-03 NOTE — Telephone Encounter (Signed)
Hydrocodone-Acetaminophen  5/325 mg  Qty 21 Tablets  PATIENT USES [facility]

## 2019-03-04 ENCOUNTER — Telehealth: Payer: Self-pay | Admitting: Radiology

## 2019-03-04 ENCOUNTER — Ambulatory Visit: Payer: Medicare Other | Admitting: Family Medicine

## 2019-03-04 NOTE — Telephone Encounter (Signed)
Patient called back again and I advised her we will not have an answer for her on the medication refill until early next week.  She is not happy, she is in pain, and this medication helps her.  She is taking it as directed, and it should be time for a refill.  Pending response from Dr Romeo Apple to Amy.

## 2019-03-04 NOTE — Telephone Encounter (Signed)
Hydrocodone denied yesterday patient has called to ask why. What would you like for me to advise her?

## 2019-03-05 ENCOUNTER — Other Ambulatory Visit: Payer: Self-pay | Admitting: Orthopedic Surgery

## 2019-03-05 DIAGNOSIS — M545 Low back pain, unspecified: Secondary | ICD-10-CM

## 2019-03-05 MED ORDER — HYDROCODONE-ACETAMINOPHEN 7.5-325 MG PO TABS: 1.0000 | ORAL_TABLET | Freq: Three times a day (TID) | ORAL | 0 refills | Status: DC | PRN

## 2019-03-05 NOTE — Telephone Encounter (Signed)
She needs to get the medicine from the doctors who started it ( I think dr Carloyn Manner retired) she needs pain management referral , I will order until she gets there and she needs a l spine film

## 2019-03-06 ENCOUNTER — Other Ambulatory Visit: Payer: Self-pay | Admitting: Family Medicine

## 2019-03-09 ENCOUNTER — Other Ambulatory Visit: Payer: Self-pay | Admitting: Orthopedic Surgery

## 2019-03-09 DIAGNOSIS — M79606 Pain in leg, unspecified: Secondary | ICD-10-CM

## 2019-03-09 DIAGNOSIS — M545 Low back pain: Secondary | ICD-10-CM

## 2019-03-09 MED ORDER — HYDROCODONE-ACETAMINOPHEN 7.5-325 MG PO TABS: 1.0000 | ORAL_TABLET | Freq: Three times a day (TID) | ORAL | 0 refills | Status: AC | PRN

## 2019-03-09 NOTE — Progress Notes (Signed)
Prescription printed had to resend

## 2019-03-09 NOTE — Telephone Encounter (Signed)
I called to advise Dr Aline Brochure has referred to pain management he does not prescribe this medication long term. I advised she will get a call, the office is in [place]. Patient has declined referral, states she will not go to [facility]  To you FYI

## 2019-03-10 NOTE — Telephone Encounter (Signed)
rx sent in yesterday But if she wont go to pain management then that will be the last

## 2019-03-10 NOTE — Telephone Encounter (Signed)
I  Have advised her Dr Aline Brochure will not continue to prescribe

## 2019-03-15 ENCOUNTER — Ambulatory Visit (INDEPENDENT_AMBULATORY_CARE_PROVIDER_SITE_OTHER): Payer: Medicare Other | Admitting: Family Medicine

## 2019-03-15 ENCOUNTER — Encounter: Payer: Self-pay | Admitting: Family Medicine

## 2019-03-15 ENCOUNTER — Other Ambulatory Visit: Payer: Self-pay

## 2019-03-15 DIAGNOSIS — M79672 Pain in left foot: Secondary | ICD-10-CM | POA: Diagnosis not present

## 2019-03-15 DIAGNOSIS — K1370 Unspecified lesions of oral mucosa: Secondary | ICD-10-CM

## 2019-03-15 DIAGNOSIS — L03116 Cellulitis of left lower limb: Secondary | ICD-10-CM | POA: Diagnosis not present

## 2019-03-15 MED ORDER — DOXYCYCLINE HYCLATE 100 MG PO TABS: 100.0000 mg | ORAL_TABLET | Freq: Two times a day (BID) | ORAL | 0 refills | Status: AC

## 2019-03-15 MED ORDER — MAGIC MOUTHWASH W/LIDOCAINE: 5.0000 mL | Freq: Three times a day (TID) | ORAL | 0 refills | Status: DC | PRN

## 2019-03-15 NOTE — Progress Notes (Signed)
Virtual Visit via telephone Note Due to COVID-19, visit is conducted virtually and was requested by patient. This visit type was conducted due to national recommendations for restrictions regarding the COVID-19 Pandemic (e.g. social distancing) in an effort to limit this patient's exposure and mitigate transmission in our community.  Due to her co-morbid illnesses, this patient is at least at moderate risk for complications without adequate follow up.  This format is felt to be most appropriate for this patient at this time.  All issues noted in this document were discussed and addressed.  A physical exam was not performed with this format.   I connected with Misty [name] on 03/15/19 at 1535 by telephone and verified that I am speaking with the correct person using two identifiers. Misty [name] is currently located at home and family is currently with them during visit. The provider, Kari Baars, FNP is located in their office at time of visit.  I discussed the limitations, risks, security and privacy concerns of performing an evaluation and management service by telephone and the availability of in person appointments. I also discussed with the patient that there may be a patient responsible charge related to this service. The patient expressed understanding and agreed to proceed.  Subjective:  Patient ID: Misty [name], female    DOB: 1941/01/29, 78 y.o.   MRN: [id number]  Chief Complaint:  Leg Problem   HPI: Misty [name] is a 78 y.o. female presenting on 03/15/2019 for Leg Problem   Pt reports she has continued left lower extremity redness and left foot pain. Pt states she recently completed Keflex for cellulitis. She reports the redness did not completely subside. States she has slight edema in her left foot. Pt states she has not followed up with podiatry. Pt denies fever, chills, weakness, or confusion. No shortness of breath, palpitations, chest pain, cough, or syncope.  States she also has sores in her mouth from the medication. States they are blister like, denies white patches. States she has used miracle mouth wash in the past with great results.    Relevant past medical, surgical, family, and social history reviewed and updated as indicated.  Allergies and medications reviewed and updated.   Past Medical History:  Diagnosis Date  . Anxiety    takes Xanax daily  . Arthritis    back  . Cataract   . Chronic back pain   . Constipation    OTC stool softener prn  . COPD (chronic obstructive pulmonary disease) (HCC)   . Depression   . Diverticulosis   . GERD (gastroesophageal reflux disease)    takes Ranidine daily  . Hemorrhoids   . History of bronchitis   . History of migraine    many yrs ago  . History of shingles   . Hyperlipidemia   . Insomnia   . Migraines   . Nocturia   . Numbness    left leg  . Osteoporosis   . PONV (postoperative nausea and vomiting)   . Urinary frequency   . Urinary urgency     Past Surgical History:  Procedure Laterality Date  . ABDOMINAL HYSTERECTOMY    . COLONOSCOPY    . COLONOSCOPY N/A 02/17/2015   Procedure: COLONOSCOPY;  Surgeon: Malissa Hippo, MD;  [facility];  Service: Endoscopy;  Laterality: N/A;  110n - moved to 11:15 - Ann to notify pt  . ESOPHAGOGASTRODUODENOSCOPY    . ESOPHAGOGASTRODUODENOSCOPY N/A 01/29/2016   Procedure: ESOPHAGOGASTRODUODENOSCOPY (EGD);  Surgeon: Malissa Hippo, MD;  [facility]: [facility];  Service: Endoscopy;  Laterality: N/A;  . ESOPHAGOGASTRODUODENOSCOPY N/A 10/14/2017   Procedure: ESOPHAGOGASTRODUODENOSCOPY (EGD);  Surgeon: Malissa Hippo, MD;  [facility]: [facility];  Service: Endoscopy;  Laterality: N/A;  730  . EYE SURGERY Right 3/15   cataracts  . INTRAMEDULLARY (IM) NAIL INTERTROCHANTERIC Left 01/07/2019   Procedure: INTRAMEDULLARY (IM) NAIL INTERTROCHANTRIC;  Surgeon: Vickki Hearing, MD;  Location: [facility];  Service: Orthopedics;  Laterality:  Left;  . KYPHOPLASTY N/A 07/08/2014   Procedure: Thoracic Eleven Kyphoplasty;  Surgeon: Hewitt Shorts, MD;  Location: [facility];  Service: Neurosurgery;  Laterality: N/A;  . LUMBAR LAMINECTOMY/DECOMPRESSION MICRODISCECTOMY Bilateral 05/20/2013   Procedure: LUMBAR LAMINECTOMY/DECOMPRESSION MICRODISCECTOMY 1 LEVEL;  Surgeon: Hewitt Shorts, MD;  Location: [facility];  Service: Neurosurgery;  Laterality: Bilateral;  Bilateral Lumbar four-five laminotomy and left lumbar four-five microdiskectomy  . SPINE SURGERY     Dr Channing Mutters -  vertebraplasty    Social History   Socioeconomic History  . Marital status: Married    Spouse name: Velda Shell   . Number of children: Not on file  . Years of education: Not on file  . Highest education level: Not on file  Occupational History  . 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 Financial resource strain: Not on file  . Food insecurity:    Worry: Not on file    Inability: Not on file  . Transportation needs:    Medical: Not on file    Non-medical: Not on file  Tobacco Use  . Smoking status: Current Some Day Smoker    Packs/day: 1.00    Years: 52.00    Pack years: 52.00    Types: Cigarettes    Start date: 11/18/1958    Last attempt to quit: 08/20/2011    Years since quitting: 7.5  . Smokeless tobacco: Never Used  . Tobacco comment: 1/2-1pack off and on all her life  Substance and Sexual Activity  . Alcohol use: No    Alcohol/week: 0.0 standard drinks  . Drug use: No  . Sexual activity: Not Currently  Lifestyle  . Physical activity:    Days per week: Not on file    Minutes per session: Not on file  . Stress: Not on file  Relationships  . Social connections:    Talks on phone: Not on file    Gets together: Not on file    Attends religious service: Not on file    Active member of club or organization: Not on file    Attends meetings of clubs or organizations: Not on file    Relationship status: Not  on file  . Intimate partner violence:    Fear of current or ex partner: Not on file    Emotionally abused: Not on file    Physically abused: Not on file    Forced sexual activity: Not on file  Other Topics Concern  . Not on file  Social History Narrative   Lives at home with husband, Velda Shell.   Had one son- now deceased     Outpatient Encounter Medications as of 03/15/2019  Medication Sig  . acetaminophen (TYLENOL) 500 MG tablet Take 500 mg by mouth every 8 (eight) hours.   Marland Kitchen albuterol (PROVENTIL HFA;VENTOLIN HFA) 108 (90 Base) MCG/ACT inhaler Inhale 2 puffs into the lungs every 6 (six) hours as needed for wheezing or  shortness of breath (cough).  Marland Kitchen aspirin EC 325 MG tablet Take 1 tablet (325 mg total) by mouth daily.  Lucilla Lame [place]-Castor Oil (VENELEX) OINT Apply topically to Sacrum and bilateral buttocks every shift and as needed for prevention  . cephALEXin (KEFLEX) 500 MG capsule Take 1 capsule (500 mg total) by mouth 3 (three) times daily.  . cholecalciferol (VITAMIN D) 1000 UNITS tablet Take 2,000-4,000 Units by mouth daily. Take 2000 units daily except take 4000 units on Saturday and Sunday.  . docusate sodium (COLACE) 100 MG capsule Take 2 capsules (200 mg total) by mouth 2 (two) times daily.  Marland Kitchen doxycycline (VIBRA-TABS) 100 MG tablet Take 1 tablet (100 mg total) by mouth 2 (two) times daily for 7 days. 1 po bid  . escitalopram (LEXAPRO) 20 MG tablet Take 1 tablet (20 mg total) by mouth at bedtime.  . FeFum-FePoly-FA-B Cmp-C-Biot (INTEGRA PLUS) CAPS Take 1 capsule by mouth daily.  . ferrous sulfate (FEOSOL) 325 (65 FE) MG tablet Take 1 tablet (325 mg total) by mouth 3 (three) times daily with meals.  Marland Kitchen HYDROcodone-acetaminophen (NORCO) 7.5-325 MG tablet Take 1 tablet by mouth every 8 (eight) hours as needed for up to 7 days for moderate pain.  . Lidocaine 4 % PTCH Apply 1 patch to left hip and 2nd patch to lumbar back and remove at HS for pain management  . magic mouthwash  w/lidocaine SOLN Take 5 mLs by mouth 3 (three) times daily as needed for mouth pain.  . Multiple Vitamin (MULTIVITAMIN WITH MINERALS) TABS tablet Take 1 tablet by mouth daily. Centrum Silver  . NON FORMULARY Diet Type: NAS  . Nutritional Supplements (ENSURE ENLIVE PO) Take 1 Bottle by mouth 2 (two) times daily between meals.  . nystatin-triamcinolone ointment (MYCOLOG) Apply 1 application topically 2 (two) times daily.  . pantoprazole (PROTONIX) 40 MG tablet Take 1 tablet (40 mg total) by mouth 2 (two) times daily before a meal.  . vitamin B-12 (CYANOCOBALAMIN) 1000 MCG tablet Take 1,000 mcg by mouth daily.   No facility-administered encounter medications on file as of 03/15/2019.     Allergies  Allergen Reactions  . Codeine Nausea And Vomiting  . Tramadol Nausea And Vomiting  . Asa [Aspirin] Other (See Comments)    Patient is unaware of allergy  . Azithromycin Other (See Comments)    Patient is unaware of allergy  . Celebrex [Celecoxib] Other (See Comments)    Irritated stomach   . Cymbalta [Duloxetine Hcl] Other (See Comments)    Felt groggy  . Prednisone Rash    She has seen the podiatrist and he had injected cortisone in her feet and she had also taken a peel at home. She had a severe reaction to her face with a rash and had to take Benadryl to resolve the symptom. She actually did get more cortisone shots, but no additional reactions to the shots.  . Vioxx [Rofecoxib] Other (See Comments)    Unknown  . Zelnorm [Tegaserod] Other (See Comments)    Patient is unaware of allergy  . Zocor [Simvastatin] Other (See Comments)    Patient is unaware of allergy    Review of Systems  Constitutional: Negative for appetite change, chills, fatigue, fever and unexpected weight change.  HENT: Positive for mouth sores.   Respiratory: Negative for cough, chest tightness, shortness of breath and wheezing.   Cardiovascular: Positive for leg swelling. Negative for chest pain and palpitations.   Genitourinary: Negative for difficulty urinating and urgency.  Skin:  Positive for color change (left foot erythema) and wound (left foot wound).  Neurological: Negative for dizziness, syncope, weakness, light-headedness and headaches.  Psychiatric/Behavioral: Negative for confusion.  All other systems reviewed and are negative.        Observations/Objective: No vital signs or physical exam, this was a telephone or virtual health encounter.  Pt alert and oriented, answers all questions appropriately, and able to speak in full sentences.    Assessment and Plan: Diagnoses and all orders for this visit:  Cellulitis of left lower extremity Due to reported ongoing symptoms, will treat with doxy. Pt aware of symptoms that require emergent evaluation. Report any new or worsening symptoms. Medications as prescribed.  -     doxycycline (VIBRA-TABS) 100 MG tablet; Take 1 tablet (100 mg total) by mouth 2 (two) times daily for 7 days. 1 po bid -     Ambulatory referral to Podiatry  Left foot pain Continued left foot pain, was referred to podiatry by ortho, has not seen podiatry. Will refer today. Report any new or worsening symptoms.  -     Ambulatory referral to Podiatry  Mouth lesion Medications as prescribed. Report any new or worsening symptoms.  -     magic mouthwash w/lidocaine SOLN; Take 5 mLs by mouth 3 (three) times daily as needed for mouth pain.     Follow Up Instructions: Return in about 1 week (around 03/22/2019), or if symptoms worsen or fail to improve, for cellulitis.    I discussed the assessment and treatment plan with the patient. The patient was provided an opportunity to ask questions and all were answered. The patient agreed with the plan and demonstrated an understanding of the instructions.   The patient was advised to call back or seek an in-person evaluation if the symptoms worsen or if the condition fails to improve as anticipated.  The above assessment and  management plan was discussed with the patient. The patient verbalized understanding of and has agreed to the management plan. Patient is aware to call the clinic if symptoms persist or worsen. Patient is aware when to return to the clinic for a follow-up visit. Patient educated on when it is appropriate to go to the emergency department.    I provided 15 minutes of non-face-to-face time during this encounter. The call started at 1535. The call ended at 1550.   Kari Baars, FNP-C [facility] [address] [place], [place] [address] [phone]

## 2019-03-22 ENCOUNTER — Ambulatory Visit: Payer: Medicare Other | Admitting: Family Medicine

## 2019-03-22 ENCOUNTER — Other Ambulatory Visit: Payer: Self-pay | Admitting: Family Medicine

## 2019-03-23 ENCOUNTER — Ambulatory Visit (INDEPENDENT_AMBULATORY_CARE_PROVIDER_SITE_OTHER): Payer: Medicare Other | Admitting: Physician Assistant

## 2019-03-23 ENCOUNTER — Other Ambulatory Visit: Payer: Self-pay

## 2019-03-23 ENCOUNTER — Encounter: Payer: Self-pay | Admitting: Physician Assistant

## 2019-03-23 DIAGNOSIS — K1370 Unspecified lesions of oral mucosa: Secondary | ICD-10-CM

## 2019-03-23 MED ORDER — MAGIC MOUTHWASH W/LIDOCAINE: 5.0000 mL | Freq: Three times a day (TID) | ORAL | 2 refills | Status: DC | PRN

## 2019-03-23 NOTE — Progress Notes (Signed)
Telephone visit  Subjective: CC: Misty [name] PCP: Ernestina Penna, MD Misty [name] is a 78 y.o. female calls for telephone consult today. Patient provides verbal consent for consult held via phone.  Patient is identified with 2 separate identifiers.  At this time the entire area is on COVID-19 social distancing and stay home orders are in place.  Patient is of higher risk and therefore we are performing this by a virtual method.  Location of patient: Home Location of provider: [facility] Others present for call: No  Patient reports that over the past month she had a lot of things going on.  She had hip surgery in February then went to rehabilitation.  After that she came home and did physical therapy and did really well then she started to have foot swelling bilaterally.  She has taken rounds of Keflex and doxycycline and she is ended up with a severe beer thrush.  N[place]dick mouthwash did do a good job and she was wanting to get some refills.  So we will send refills for this medication and she will call us if things not get any better.   ROS: Per HPI  Allergies  Allergen Reactions  . Codeine Nausea And Vomiting  . Tramadol Nausea And Vomiting  . Asa [Aspirin] Other (See Comments)    Patient is unaware of allergy  . Azithromycin Other (See Comments)    Patient is unaware of allergy  . Celebrex [Celecoxib] Other (See Comments)    Irritated stomach   . Cymbalta [Duloxetine Hcl] Other (See Comments)    Felt groggy  . Prednisone Rash    She has seen the podiatrist and he had injected cortisone in her feet and she had also taken a peel at home. She had a severe reaction to her face with a rash and had to take Benadryl to resolve the symptom. She actually did get more cortisone shots, but no additional reactions to the shots.  . Vioxx [Rofecoxib] Other (See Comments)    Unknown  . Zelnorm [Tegaserod] Other (See Comments)    Patient is unaware of allergy  . Zocor [Simvastatin] Other  (See Comments)    Patient is unaware of allergy   Past Medical History:  Diagnosis Date  . Anxiety    takes Xanax daily  . Arthritis    back  . Cataract   . Chronic back pain   . Constipation    OTC stool softener prn  . COPD (chronic obstructive pulmonary disease) (HCC)   . Depression   . Diverticulosis   . GERD (gastroesophageal reflux disease)    takes Ranidine daily  . Hemorrhoids   . History of bronchitis   . History of migraine    many yrs ago  . History of shingles   . Hyperlipidemia   . Insomnia   . Migraines   . Nocturia   . Numbness    left leg  . Osteoporosis   . PONV (postoperative nausea and vomiting)   . Urinary frequency   . Urinary urgency     Current Outpatient Medications:  .  acetaminophen (TYLENOL) 500 MG tablet, Take 500 mg by mouth every 8 (eight) hours. , Disp: , Rfl:  .  albuterol (PROVENTIL HFA;VENTOLIN HFA) 108 (90 Base) MCG/ACT inhaler, Inhale 2 puffs into the lungs every 6 (six) hours as needed for wheezing or shortness of breath (cough)., Disp: 1 Inhaler, Rfl: 0 .  aspirin EC 325 MG tablet, Take 1 tablet (325 mg  total) by mouth daily., Disp: 100 tablet, Rfl: 3 .  Balsam [place]-Castor Oil (VENELEX) OINT, Apply topically to Sacrum and bilateral buttocks every shift and as needed for prevention, Disp: , Rfl:  .  cholecalciferol (VITAMIN D) 1000 UNITS tablet, Take 2,000-4,000 Units by mouth daily. Take 2000 units daily except take 4000 units on Saturday and Sunday., Disp: , Rfl:  .  docusate sodium (COLACE) 100 MG capsule, Take 2 capsules (200 mg total) by mouth 2 (two) times daily., Disp: 10 capsule, Rfl: 0 .  escitalopram (LEXAPRO) 20 MG tablet, Take 1 tablet (20 mg total) by mouth at bedtime., Disp: 30 tablet, Rfl: 0 .  FeFum-FePoly-FA-B Cmp-C-Biot (INTEGRA PLUS) CAPS, Take 1 capsule by mouth daily., Disp: 30 capsule, Rfl: 0 .  ferrous sulfate (FEOSOL) 325 (65 FE) MG tablet, Take 1 tablet (325 mg total) by mouth 3 (three) times daily with meals.,  Disp: 90 tablet, Rfl: 0 .  Lidocaine 4 % PTCH, Apply 1 patch to left hip and 2nd patch to lumbar back and remove at HS for pain management, Disp: , Rfl:  .  magic mouthwash w/lidocaine SOLN, Take 5 mLs by mouth 3 (three) times daily as needed for mouth pain., Disp: 100 mL, Rfl: 2 .  Multiple Vitamin (MULTIVITAMIN WITH MINERALS) TABS tablet, Take 1 tablet by mouth daily. Centrum Silver, Disp: , Rfl:  .  NON FORMULARY, Diet Type: NAS, Disp: , Rfl:  .  Nutritional Supplements (ENSURE ENLIVE PO), Take 1 Bottle by mouth 2 (two) times daily between meals., Disp: , Rfl:  .  nystatin-triamcinolone ointment (MYCOLOG), Apply 1 application topically 2 (two) times daily., Disp: 30 g, Rfl: 0 .  pantoprazole (PROTONIX) 40 MG tablet, Take 1 tablet (40 mg total) by mouth 2 (two) times daily before a meal., Disp: 60 tablet, Rfl: 0 .  vitamin B-12 (CYANOCOBALAMIN) 1000 MCG tablet, Take 1,000 mcg by mouth daily., Disp: , Rfl:   Assessment/ Plan: 78 y.o. female   1. Mouth lesion - magic mouthwash w/lidocaine SOLN; Take 5 mLs by mouth 3 (three) times daily as needed for mouth pain.  Dispense: 100 mL; Refill: 2   Start time: 4:38 PM End time: 4:49 PM  Meds ordered this encounter  Medications  . DISCONTD: magic mouthwash w/lidocaine SOLN    Sig: Take 5 mLs by mouth 3 (three) times daily as needed for mouth pain.    Dispense:  100 mL    Refill:  2    Order Specific Question:   Supervising Provider    Answer:   Raliegh Ip [[id number]]  . magic mouthwash w/lidocaine SOLN    Sig: Take 5 mLs by mouth 3 (three) times daily as needed for mouth pain.    Dispense:  100 mL    Refill:  2    Order Specific Question:   Supervising Provider    Answer:   Raliegh Ip [[id number]]    Prudy Feeler PA-C [facility] [phone]

## 2019-03-25 ENCOUNTER — Other Ambulatory Visit: Payer: Self-pay

## 2019-03-25 ENCOUNTER — Inpatient Hospital Stay (HOSPITAL_COMMUNITY)
Admission: EM | Admit: 2019-03-25 | Discharge: 2019-04-02 | DRG: 286 | Disposition: A | Discharge status: Home Health | Payer: Medicare Other | Attending: Internal Medicine | Admitting: Internal Medicine

## 2019-03-25 ENCOUNTER — Encounter: Payer: Self-pay | Admitting: Family Medicine

## 2019-03-25 ENCOUNTER — Ambulatory Visit (INDEPENDENT_AMBULATORY_CARE_PROVIDER_SITE_OTHER): Payer: Medicare Other | Admitting: Family Medicine

## 2019-03-25 ENCOUNTER — Encounter (HOSPITAL_COMMUNITY): Payer: Self-pay | Admitting: *Deleted

## 2019-03-25 ENCOUNTER — Emergency Department (HOSPITAL_COMMUNITY): Payer: Medicare Other

## 2019-03-25 DIAGNOSIS — R450 Nervousness: Secondary | ICD-10-CM | POA: Diagnosis not present

## 2019-03-25 DIAGNOSIS — R0602 Shortness of breath: Secondary | ICD-10-CM | POA: Diagnosis not present

## 2019-03-25 DIAGNOSIS — I700 Atherosclerosis of aorta: Secondary | ICD-10-CM | POA: Diagnosis present

## 2019-03-25 DIAGNOSIS — F32A Depression, unspecified: Secondary | ICD-10-CM | POA: Diagnosis present

## 2019-03-25 DIAGNOSIS — F419 Anxiety disorder, unspecified: Secondary | ICD-10-CM | POA: Diagnosis not present

## 2019-03-25 DIAGNOSIS — K219 Gastro-esophageal reflux disease without esophagitis: Secondary | ICD-10-CM | POA: Diagnosis present

## 2019-03-25 DIAGNOSIS — J189 Pneumonia, unspecified organism: Secondary | ICD-10-CM | POA: Diagnosis not present

## 2019-03-25 DIAGNOSIS — J440 Chronic obstructive pulmonary disease with acute lower respiratory infection: Secondary | ICD-10-CM | POA: Diagnosis present

## 2019-03-25 DIAGNOSIS — Z1159 Encounter for screening for other viral diseases: Secondary | ICD-10-CM

## 2019-03-25 DIAGNOSIS — E785 Hyperlipidemia, unspecified: Secondary | ICD-10-CM | POA: Diagnosis present

## 2019-03-25 DIAGNOSIS — F17210 Nicotine dependence, cigarettes, uncomplicated: Secondary | ICD-10-CM | POA: Diagnosis not present

## 2019-03-25 DIAGNOSIS — R000 Tachycardia, unspecified: Secondary | ICD-10-CM | POA: Diagnosis not present

## 2019-03-25 DIAGNOSIS — J13 Pneumonia due to Streptococcus pneumoniae: Secondary | ICD-10-CM | POA: Diagnosis present

## 2019-03-25 DIAGNOSIS — J9601 Acute respiratory failure with hypoxia: Secondary | ICD-10-CM

## 2019-03-25 DIAGNOSIS — Z90710 Acquired absence of both cervix and uterus: Secondary | ICD-10-CM

## 2019-03-25 DIAGNOSIS — I4891 Unspecified atrial fibrillation: Secondary | ICD-10-CM

## 2019-03-25 DIAGNOSIS — R778 Other specified abnormalities of plasma proteins: Secondary | ICD-10-CM | POA: Diagnosis present

## 2019-03-25 DIAGNOSIS — R7989 Other specified abnormal findings of blood chemistry: Secondary | ICD-10-CM | POA: Diagnosis present

## 2019-03-25 DIAGNOSIS — Z7982 Long term (current) use of aspirin: Secondary | ICD-10-CM

## 2019-03-25 DIAGNOSIS — I428 Other cardiomyopathies: Secondary | ICD-10-CM | POA: Diagnosis present

## 2019-03-25 DIAGNOSIS — J181 Lobar pneumonia, unspecified organism: Secondary | ICD-10-CM

## 2019-03-25 DIAGNOSIS — I2510 Atherosclerotic heart disease of native coronary artery without angina pectoris: Secondary | ICD-10-CM | POA: Diagnosis present

## 2019-03-25 DIAGNOSIS — J90 Pleural effusion, not elsewhere classified: Secondary | ICD-10-CM | POA: Diagnosis not present

## 2019-03-25 DIAGNOSIS — J918 Pleural effusion in other conditions classified elsewhere: Secondary | ICD-10-CM | POA: Diagnosis present

## 2019-03-25 DIAGNOSIS — R531 Weakness: Secondary | ICD-10-CM | POA: Diagnosis not present

## 2019-03-25 DIAGNOSIS — I340 Nonrheumatic mitral (valve) insufficiency: Secondary | ICD-10-CM | POA: Diagnosis not present

## 2019-03-25 DIAGNOSIS — Z9889 Other specified postprocedural states: Secondary | ICD-10-CM | POA: Diagnosis not present

## 2019-03-25 DIAGNOSIS — F329 Major depressive disorder, single episode, unspecified: Secondary | ICD-10-CM | POA: Diagnosis present

## 2019-03-25 DIAGNOSIS — D509 Iron deficiency anemia, unspecified: Secondary | ICD-10-CM | POA: Diagnosis not present

## 2019-03-25 DIAGNOSIS — R0600 Dyspnea, unspecified: Secondary | ICD-10-CM | POA: Diagnosis not present

## 2019-03-25 DIAGNOSIS — G8929 Other chronic pain: Secondary | ICD-10-CM | POA: Diagnosis present

## 2019-03-25 DIAGNOSIS — E440 Moderate protein-calorie malnutrition: Secondary | ICD-10-CM | POA: Diagnosis present

## 2019-03-25 DIAGNOSIS — Z888 Allergy status to other drugs, medicaments and biological substances status: Secondary | ICD-10-CM

## 2019-03-25 DIAGNOSIS — E876 Hypokalemia: Secondary | ICD-10-CM

## 2019-03-25 DIAGNOSIS — I480 Paroxysmal atrial fibrillation: Principal | ICD-10-CM | POA: Diagnosis present

## 2019-03-25 DIAGNOSIS — I110 Hypertensive heart disease with heart failure: Secondary | ICD-10-CM | POA: Diagnosis not present

## 2019-03-25 DIAGNOSIS — I509 Heart failure, unspecified: Secondary | ICD-10-CM

## 2019-03-25 DIAGNOSIS — I5021 Acute systolic (congestive) heart failure: Secondary | ICD-10-CM | POA: Diagnosis present

## 2019-03-25 DIAGNOSIS — E875 Hyperkalemia: Secondary | ICD-10-CM | POA: Diagnosis present

## 2019-03-25 DIAGNOSIS — Z79899 Other long term (current) drug therapy: Secondary | ICD-10-CM

## 2019-03-25 DIAGNOSIS — I5041 Acute combined systolic (congestive) and diastolic (congestive) heart failure: Secondary | ICD-10-CM | POA: Diagnosis not present

## 2019-03-25 DIAGNOSIS — Z681 Body mass index (BMI) 19 or less, adult: Secondary | ICD-10-CM

## 2019-03-25 DIAGNOSIS — Z8781 Personal history of (healed) traumatic fracture: Secondary | ICD-10-CM

## 2019-03-25 DIAGNOSIS — R0689 Other abnormalities of breathing: Secondary | ICD-10-CM | POA: Diagnosis not present

## 2019-03-25 DIAGNOSIS — Z885 Allergy status to narcotic agent status: Secondary | ICD-10-CM

## 2019-03-25 DIAGNOSIS — I471 Supraventricular tachycardia: Secondary | ICD-10-CM

## 2019-03-25 DIAGNOSIS — I429 Cardiomyopathy, unspecified: Secondary | ICD-10-CM | POA: Diagnosis not present

## 2019-03-25 DIAGNOSIS — J9811 Atelectasis: Secondary | ICD-10-CM | POA: Diagnosis present

## 2019-03-25 DIAGNOSIS — E872 Acidosis: Secondary | ICD-10-CM | POA: Diagnosis not present

## 2019-03-25 DIAGNOSIS — M7989 Other specified soft tissue disorders: Secondary | ICD-10-CM

## 2019-03-25 DIAGNOSIS — R896 Abnormal cytological findings in specimens from other organs, systems and tissues: Secondary | ICD-10-CM | POA: Diagnosis not present

## 2019-03-25 DIAGNOSIS — I4719 Other supraventricular tachycardia: Secondary | ICD-10-CM

## 2019-03-25 DIAGNOSIS — I4892 Unspecified atrial flutter: Secondary | ICD-10-CM | POA: Diagnosis present

## 2019-03-25 DIAGNOSIS — Z886 Allergy status to analgesic agent status: Secondary | ICD-10-CM

## 2019-03-25 DIAGNOSIS — Z8249 Family history of ischemic heart disease and other diseases of the circulatory system: Secondary | ICD-10-CM

## 2019-03-25 DIAGNOSIS — Z9181 History of falling: Secondary | ICD-10-CM

## 2019-03-25 DIAGNOSIS — R069 Unspecified abnormalities of breathing: Secondary | ICD-10-CM | POA: Diagnosis not present

## 2019-03-25 DIAGNOSIS — I361 Nonrheumatic tricuspid (valve) insufficiency: Secondary | ICD-10-CM | POA: Diagnosis not present

## 2019-03-25 HISTORY — DX: Other specified abnormal findings of blood chemistry: R79.89

## 2019-03-25 HISTORY — DX: Other specified abnormalities of plasma proteins: R77.8

## 2019-03-25 HISTORY — DX: Unspecified atrial fibrillation: I48.91

## 2019-03-25 LAB — CBC WITH DIFFERENTIAL/PLATELET
Abs Immature Granulocytes: 0.06 10*3/uL (ref 0.00–0.07)
Basophils Absolute: 0 10*3/uL (ref 0.0–0.1)
Basophils Relative: 0 %
Eosinophils Absolute: 0 10*3/uL (ref 0.0–0.5)
Eosinophils Relative: 0 %
HCT: 43.2 % (ref 36.0–46.0)
Hemoglobin: 12.7 g/dL (ref 12.0–15.0)
Immature Granulocytes: 1 %
Lymphocytes Relative: 27 %
Lymphs Abs: 2.5 10*3/uL (ref 0.7–4.0)
MCH: 28.7 pg (ref 26.0–34.0)
MCHC: 29.4 g/dL — ABNORMAL LOW (ref 30.0–36.0)
MCV: 97.7 fL (ref 80.0–100.0)
Monocytes Absolute: 0.5 10*3/uL (ref 0.1–1.0)
Monocytes Relative: 6 %
Neutro Abs: 6.2 10*3/uL (ref 1.7–7.7)
Neutrophils Relative %: 66 %
Platelets: 285 10*3/uL (ref 150–400)
RBC: 4.42 MIL/uL (ref 3.87–5.11)
RDW: 17.1 % — ABNORMAL HIGH (ref 11.5–15.5)
WBC: 9.3 10*3/uL (ref 4.0–10.5)
nRBC: 0.3 % — ABNORMAL HIGH (ref 0.0–0.2)

## 2019-03-25 LAB — BASIC METABOLIC PANEL
Anion gap: >20 — ABNORMAL HIGH (ref 5–15)
BUN: 21 mg/dL (ref 8–23)
CO2: 24 mmol/L (ref 22–32)
Calcium: 8.6 mg/dL — ABNORMAL LOW (ref 8.9–10.3)
Chloride: 95 mmol/L — ABNORMAL LOW (ref 98–111)
Creatinine, Ser: 1.21 mg/dL — ABNORMAL HIGH (ref 0.44–1.00)
GFR calc Af Amer: 50 mL/min — ABNORMAL LOW (ref >60)
GFR calc non Af Amer: 43 mL/min — ABNORMAL LOW (ref >60)
Glucose, Bld: 230 mg/dL — ABNORMAL HIGH (ref 70–99)
Potassium: 2.9 mmol/L — ABNORMAL LOW (ref 3.5–5.1)
Sodium: 142 mmol/L (ref 135–145)

## 2019-03-25 LAB — TROPONIN I
Troponin I: 0.14 ng/mL (ref <0.03)
Troponin I: 0.11 ng/mL (ref <0.03)

## 2019-03-25 LAB — TSH: TSH: 2.216 u[IU]/mL (ref 0.350–4.500)

## 2019-03-25 LAB — LACTIC ACID, PLASMA
Lactic Acid, Venous: 5.8 mmol/L (ref 0.5–1.9)
Lactic Acid, Venous: 2.8 mmol/L (ref 0.5–1.9)

## 2019-03-25 LAB — D-DIMER, QUANTITATIVE: D-Dimer, Quant: 1.69 ug/mL-FEU — ABNORMAL HIGH (ref 0.00–0.50)

## 2019-03-25 LAB — MAGNESIUM: Magnesium: 2.1 mg/dL (ref 1.7–2.4)

## 2019-03-25 LAB — BRAIN NATRIURETIC PEPTIDE: B Natriuretic Peptide: >4500 pg/mL — ABNORMAL HIGH (ref 0.0–100.0)

## 2019-03-25 LAB — SARS CORONAVIRUS 2 BY RT PCR (HOSPITAL ORDER, PERFORMED IN ~~LOC~~ HOSPITAL LAB): SARS Coronavirus 2: NEGATIVE

## 2019-03-25 MED ORDER — ACETAMINOPHEN 650 MG RE SUPP: 650.0000 mg | Freq: Four times a day (QID) | RECTAL | Status: DC | PRN

## 2019-03-25 MED ORDER — SODIUM CHLORIDE 0.9% FLUSH
3.0000 mL | Freq: Two times a day (BID) | INTRAVENOUS | Status: DC
  Administered 2019-03-26 – 2019-04-02 (×9): 3 mL via INTRAVENOUS

## 2019-03-25 MED ORDER — HEPARIN BOLUS VIA INFUSION
3600.0000 [IU] | Freq: Once | INTRAVENOUS | Status: AC
  Administered 2019-03-25: 3600 [IU] via INTRAVENOUS

## 2019-03-25 MED ORDER — POTASSIUM CHLORIDE 10 MEQ/100ML IV SOLN
10.0000 meq | INTRAVENOUS | Status: AC
  Administered 2019-03-25 – 2019-03-26 (×2): 10 meq via INTRAVENOUS
  Filled 2019-03-25 (×3): qty 100

## 2019-03-25 MED ORDER — DOXYCYCLINE HYCLATE 100 MG PO TABS
100.0000 mg | ORAL_TABLET | Freq: Once | ORAL | Status: AC
  Administered 2019-03-25: 100 mg via ORAL
  Filled 2019-03-25: qty 1

## 2019-03-25 MED ORDER — LORAZEPAM 2 MG/ML IJ SOLN
INTRAMUSCULAR | Status: AC
  Filled 2019-03-25: qty 1

## 2019-03-25 MED ORDER — SODIUM CHLORIDE 0.9% FLUSH: 3.0000 mL | INTRAVENOUS | Status: DC | PRN

## 2019-03-25 MED ORDER — HEPARIN (PORCINE) 25000 UT/250ML-% IV SOLN
900.0000 [IU]/h | INTRAVENOUS | Status: DC
  Administered 2019-03-25 – 2019-03-30 (×4): 900 [IU]/h via INTRAVENOUS
  Filled 2019-03-25 (×5): qty 250

## 2019-03-25 MED ORDER — DILTIAZEM HCL 100 MG IV SOLR
5.0000 mg/h | INTRAVENOUS | Status: DC
  Administered 2019-03-25: 5 mg/h via INTRAVENOUS
  Filled 2019-03-25 (×2): qty 100

## 2019-03-25 MED ORDER — SODIUM CHLORIDE 0.9 % IV SOLN
INTRAVENOUS | Status: DC
  Administered 2019-03-25: via INTRAVENOUS

## 2019-03-25 MED ORDER — SODIUM CHLORIDE 0.9 % IV BOLUS
500.0000 mL | Freq: Once | INTRAVENOUS | Status: AC
  Administered 2019-03-25: 21:00:00 500 mL via INTRAVENOUS

## 2019-03-25 MED ORDER — DILTIAZEM LOAD VIA INFUSION
10.0000 mg | Freq: Once | INTRAVENOUS | Status: DC
  Filled 2019-03-25: qty 10

## 2019-03-25 MED ORDER — SODIUM CHLORIDE 0.9 % IV SOLN
1.0000 g | Freq: Once | INTRAVENOUS | Status: AC
  Administered 2019-03-25: 1 g via INTRAVENOUS
  Filled 2019-03-25: qty 10

## 2019-03-25 MED ORDER — ACETAMINOPHEN 325 MG PO TABS
650.0000 mg | ORAL_TABLET | Freq: Four times a day (QID) | ORAL | Status: DC | PRN
  Administered 2019-03-26 – 2019-04-01 (×12): 650 mg via ORAL
  Filled 2019-03-25 (×12): qty 2

## 2019-03-25 MED ORDER — SODIUM CHLORIDE 0.9 % IV SOLN: 250.0000 mL | INTRAVENOUS | Status: DC | PRN

## 2019-03-25 MED ORDER — FUROSEMIDE 10 MG/ML IJ SOLN
20.0000 mg | Freq: Once | INTRAMUSCULAR | Status: AC
  Administered 2019-03-25: 20 mg via INTRAVENOUS
  Filled 2019-03-25: qty 2

## 2019-03-25 MED ORDER — LORAZEPAM 2 MG/ML IJ SOLN
0.5000 mg | Freq: Once | INTRAMUSCULAR | Status: AC
  Administered 2019-03-25: 0.5 mg via INTRAVENOUS

## 2019-03-25 NOTE — ED Notes (Signed)
Date and time results received: 03/25/19 2215 (use smartphrase ".now" to insert current time)  Test: lactic  Critical Value: 5.8  Name of Provider Notified: Dr. Charm Barges  Orders Received? Or Actions Taken?: no/na

## 2019-03-25 NOTE — ED Provider Notes (Signed)
[facility] EMERGENCY DEPARTMENT Provider Note   CSN: [id number] Arrival date & time: 03/25/19  2013    History   Chief Complaint Chief Complaint  Patient presents with  . Shortness of Breath    HPI Misty [name] is a 78 y.o. female.  She is complaining of increased shortness of breath that is been going on for over a week.  Its associated with a cough with white phlegm. no chest pain.  No nausea vomiting diarrhea.  No fever.  She said it is making her feel anxious like she is going to pass out.  She is normally on Xanax and she ran out of it today and has not taken any.  She usually takes 0.5 mg 3 times a day.  He is complaining of some left hip pain since her surgery a few months ago.     The history is provided by the patient.  Shortness of Breath  Severity:  Severe Onset quality:  Gradual Duration:  1 week Timing:  Constant Progression:  Worsening Chronicity:  New Relieved by:  Nothing Worsened by:  Nothing Ineffective treatments:  None tried Associated symptoms: cough, diaphoresis and sputum production   Associated symptoms: no abdominal pain, no chest pain, no fever, no headaches, no hemoptysis, no neck pain, no rash, no sore throat, no syncope and no vomiting     Past Medical History:  Diagnosis Date  . Anxiety    takes Xanax daily  . Arthritis    back  . Cataract   . Chronic back pain   . Constipation    OTC stool softener prn  . COPD (chronic obstructive pulmonary disease) ([place])   . Depression   . Diverticulosis   . GERD (gastroesophageal reflux disease)    takes Ranidine daily  . Hemorrhoids   . History of bronchitis   . History of migraine    many yrs ago  . History of shingles   . Hyperlipidemia   . Insomnia   . Migraines   . Nocturia   . Numbness    left leg  . Osteoporosis   . PONV (postoperative nausea and vomiting)   . Urinary frequency   . Urinary urgency     Patient Active Problem List   Diagnosis Date Noted  . S/P IM nail left  hip 01/07/2019 01/26/2019  . H/O kyphoplasty 01/14/2019  . Iron deficiency anemia 01/14/2019  . Acute blood loss anemia 01/13/2019  . Constipation due to opioid therapy 01/13/2019  . Anxiety and depression 01/13/2019  . Hypokalemia 01/10/2019  . Displaced intertrochanteric fracture of left femur, sequela 01/06/2019  . Chronic midline low back pain without sciatica 10/09/2018  . Chronic midline thoracic back pain 10/09/2018  . Osteopenia determined by x-ray 04/16/2018  . Pulmonary fibrosis ([place]) 04/07/2018  . Non-intractable vomiting with nausea 10/06/2017  . Malnutrition of moderate degree 01/29/2016  . Generalized weakness 01/27/2016  . Abnormal chest x-ray 01/27/2016  . Tobacco use disorder 01/27/2016  . Colitis   . Hematochezia 02/26/2015  . Ischemic colitis ([place]) 02/26/2015  . Orthostatic hypotension 02/26/2015  . Nontraumatic compression fracture of T11 vertebra (HCC) 07/08/2014  . Hyperlipidemia 08/19/2013  . Fibrocystic breast disease 08/19/2013  . History of migraine headaches 08/19/2013  . GERD (gastroesophageal reflux disease) 04/07/2013  . Osteoporosis, postmenopausal 04/07/2013    Past Surgical History:  Procedure Laterality Date  . ABDOMINAL HYSTERECTOMY    . COLONOSCOPY    . COLONOSCOPY N/A 02/17/2015   Procedure: COLONOSCOPY;  Surgeon: Bernadene Person  Gloriann Loan, MD;  Location: [facility];  Service: Endoscopy;  Laterality: N/A;  110n - moved to 11:15 - Ann to notify pt  . ESOPHAGOGASTRODUODENOSCOPY    . ESOPHAGOGASTRODUODENOSCOPY N/A 01/29/2016   Procedure: ESOPHAGOGASTRODUODENOSCOPY (EGD);  Surgeon: Rogene Houston, MD;  Location: [facility] END[facility];  Service: Endoscopy;  Laterality: N/A;  . ESOPHAGOGASTRODUODENOSCOPY N/A 10/14/2017   Procedure: ESOPHAGOGASTRODUODENOSCOPY (EGD);  Surgeon: Rogene Houston, MD;  Location: [facility] ENDO SUITE;  Service: Endoscopy;  Laterality: N/A;  730  . EYE SURGERY Right 3/15   cataracts  . INTRAMEDULLARY (IM) NAIL INTERTROCHANTERIC Left 01/07/2019    Procedure: INTRAMEDULLARY (IM) NAIL INTERTROCHANTRIC;  Surgeon: Carole Civil, MD;  Location: [facility] ORS;  Service: Orthopedics;  Laterality: Left;  . KYPHOPLASTY N/A 07/08/2014   Procedure: Thoracic Eleven Kyphoplasty;  Surgeon: Hosie Spangle, MD;  Location: [place] NEURO ORS;  Service: Neurosurgery;  Laterality: N/A;  . LUMBAR LAMINECTOMY/DECOMPRESSION MICRODISCECTOMY Bilateral 05/20/2013   Procedure: LUMBAR LAMINECTOMY/DECOMPRESSION MICRODISCECTOMY 1 LEVEL;  Surgeon: Hosie Spangle, MD;  Location: [place] NEURO ORS;  Service: Neurosurgery;  Laterality: Bilateral;  Bilateral Lumbar four-five laminotomy and left lumbar four-five microdiskectomy  . SPINE SURGERY     Dr Carloyn Manner -  vertebraplasty     OB History   No obstetric history on file.      Home Medications    Prior to Admission medications   Medication Sig Start Date End Date Taking? Authorizing Provider  acetaminophen (TYLENOL) 500 MG tablet Take 500 mg by mouth every 8 (eight) hours.  01/20/19   [provider]  albuterol (PROVENTIL HFA;VENTOLIN HFA) 108 (90 Base) MCG/ACT inhaler Inhale 2 puffs into the lungs every 6 (six) hours as needed for wheezing or shortness of breath (cough). 01/28/19   Gerlene Fee, NP  aspirin EC 325 MG tablet Take 1 tablet (325 mg total) by mouth daily. 01/12/19 01/12/20  Kathie Dike, MD  Balsam [place]-Castor Oil [facility]) OINT Apply topically to Sacrum and bilateral buttocks every shift and as needed for prevention 01/13/19   [provider]  cholecalciferol (VITAMIN D) 1000 UNITS tablet Take 2,000-4,000 Units by mouth daily. Take 2000 units daily except take 4000 units on Saturday and Sunday.    [provider]  docusate sodium (COLACE) 100 MG capsule Take 2 capsules (200 mg total) by mouth 2 (two) times daily. 01/12/19   Kathie Dike, MD  escitalopram (LEXAPRO) 20 MG tablet Take 1 tablet (20 mg total) by mouth at bedtime. 01/28/19   Gerlene Fee, NP  FeFum-FePoly-FA-B  Cmp-C-Biot (INTEGRA PLUS) CAPS Take 1 capsule by mouth daily. 01/28/19   Gerlene Fee, NP  ferrous sulfate (FEOSOL) 325 (65 FE) MG tablet Take 1 tablet (325 mg total) by mouth 3 (three) times daily with meals. 01/28/19   Gerlene Fee, NP  Lidocaine 4 % PTCH Apply 1 patch to left hip and 2nd patch to lumbar back and remove at [facility] for pain management 01/21/19   [provider]  magic mouthwash w/lidocaine SOLN Take 5 mLs by mouth 3 (three) times daily as needed for mouth pain. 03/23/19   Terald Sleeper, PA-C  Multiple Vitamin (MULTIVITAMIN WITH MINERALS) TABS tablet Take 1 tablet by mouth daily. Centrum Silver    [provider]  NON FORMULARY Diet Type: NAS 01/12/19   [provider]  Nutritional Supplements (ENSURE ENLIVE PO) Take 1 Bottle by mouth 2 (two) times daily between meals. 01/13/19   [provider]  nystatin-triamcinolone ointment Lilyan Gilford) Apply  1 application topically 2 (two) times daily. 02/12/19   Chipper Herb, MD  pantoprazole (PROTONIX) 40 MG tablet Take 1 tablet (40 mg total) by mouth 2 (two) times daily before a meal. 01/28/19   Nyoka Cowden, Phylis Bougie, NP  vitamin B-12 (CYANOCOBALAMIN) 1000 MCG tablet Take 1,000 mcg by mouth daily.    [provider]    Family History Family History  Problem Relation Age of Onset  . Hypertension Mother   . Macular degeneration Father   . Heart disease Father   . Cirrhosis Son   . Heart disease Son   . Colon cancer Neg Hx     Social History Social History   Tobacco Use  . Smoking status: Current Some Day Smoker    Packs/day: 1.00    Years: 52.00    Pack years: 52.00    Types: Cigarettes    Start date: 11/18/1958    Last attempt to quit: 08/20/2011    Years since quitting: 7.6  . Smokeless tobacco: Never Used  . Tobacco comment: 1/2-1pack off and on all her life  Substance Use Topics  . Alcohol use: No    Alcohol/week: 0.0 standard drinks  . Drug use: No     Allergies   Codeine;  Tramadol; Asa [aspirin]; Azithromycin; Celebrex [celecoxib]; Cymbalta [duloxetine hcl]; Prednisone; Vioxx [rofecoxib]; Zelnorm [tegaserod]; and Zocor [simvastatin]   Review of Systems Review of Systems  Constitutional: Positive for diaphoresis. Negative for fever.  HENT: Negative for sore throat.   Eyes: Negative for visual disturbance.  Respiratory: Positive for cough, sputum production and shortness of breath. Negative for hemoptysis.   Cardiovascular: Negative for chest pain and syncope.  Gastrointestinal: Negative for abdominal pain and vomiting.  Genitourinary: Negative for dysuria.  Musculoskeletal: Negative for neck pain.  Skin: Negative for rash.  Neurological: Negative for headaches.     Physical Exam Updated Vital Signs BP (!) 137/114   Pulse (!) 136   Resp (!) 29   Ht 5' 7"  (1.702 m)   Wt 56.7 kg   SpO2 97%   BMI 19.58 kg/m   Physical Exam Vitals signs and nursing note reviewed.  Constitutional:      General: She is not in acute distress.    Appearance: She is well-developed. She is diaphoretic.  HENT:     Head: Normocephalic and atraumatic.  Eyes:     Conjunctiva/sclera: Conjunctivae normal.  Neck:     Musculoskeletal: Neck supple.  Cardiovascular:     Rate and Rhythm: Tachycardia present. Rhythm irregular.     Heart sounds: No murmur.  Pulmonary:     Effort: Pulmonary effort is normal. No respiratory distress.     Breath sounds: Normal breath sounds.  Abdominal:     Palpations: Abdomen is soft.     Tenderness: There is no abdominal tenderness.  Musculoskeletal: Normal range of motion.     Right lower leg: She exhibits no tenderness.     Left lower leg: She exhibits no tenderness.  Skin:    General: Skin is warm.     Capillary Refill: Capillary refill takes less than 2 seconds.  Neurological:     General: No focal deficit present.     Mental Status: She is alert.      ED Treatments / Results  Labs (all labs ordered are listed, but only  abnormal results are displayed) Labs Reviewed  BASIC METABOLIC PANEL - Abnormal; Notable for the following components:      Result Value   Potassium  2.9 (*)    Chloride 95 (*)    Glucose, Bld 230 (*)    Creatinine, Ser 1.21 (*)    Calcium 8.6 (*)    GFR calc non Af Amer 43 (*)    GFR calc Af Amer 50 (*)    Anion gap >20 (*)    All other components within normal limits  TROPONIN I - Abnormal; Notable for the following components:   Troponin I 0.14 (*)    All other components within normal limits  CBC WITH DIFFERENTIAL/PLATELET - Abnormal; Notable for the following components:   MCHC 29.4 (*)    RDW 17.1 (*)    nRBC 0.3 (*)    All other components within normal limits  LACTIC ACID, PLASMA - Abnormal; Notable for the following components:   Lactic Acid, Venous 5.8 (*)    All other components within normal limits  LACTIC ACID, PLASMA - Abnormal; Notable for the following components:   Lactic Acid, Venous 2.8 (*)    All other components within normal limits  BRAIN NATRIURETIC PEPTIDE - Abnormal; Notable for the following components:   B Natriuretic Peptide >4,500.0 (*)    All other components within normal limits  D-DIMER, QUANTITATIVE (NOT AT [facility]) - Abnormal; Notable for the following components:   D-Dimer, Quant 1.69 (*)    All other components within normal limits  TROPONIN I - Abnormal; Notable for the following components:   Troponin I 0.11 (*)    All other components within normal limits  LIPID PANEL - Abnormal; Notable for the following components:   HDL 33 (*)    All other components within normal limits  CBC - Abnormal; Notable for the following components:   RBC 3.84 (*)    Hemoglobin 10.9 (*)    HCT 35.4 (*)    RDW 16.7 (*)    All other components within normal limits  TROPONIN I - Abnormal; Notable for the following components:   Troponin I 0.15 (*)    All other components within normal limits  CULTURE, BLOOD (ROUTINE X 2)  CULTURE, BLOOD (ROUTINE X 2)  SARS  CORONAVIRUS 2 (HOSPITAL ORDER, [place] [facility])  TSH  MAGNESIUM  HEMOGLOBIN A1C  HEPARIN LEVEL (UNFRACTIONATED)  HEPARIN LEVEL (UNFRACTIONATED)  STREP PNEUMONIAE URINARY ANTIGEN  LEGIONELLA PNEUMOPHILA SEROGP 1 UR AG  TROPONIN I  TROPONIN I    EKG EKG Interpretation  Date/Time:  Thursday Mar 25 2019 20:42:38 EDT Ventricular Rate:  139 PR Interval:    QRS Duration: 102 QT Interval:  305 QTC Calculation: 464 R Axis:   -66 Text Interpretation:  Ectopic atrial tachycardia, unifocal Left anterior fascicular block Anterior infarct, old Repolarization abnormality, prob rate related Artifact in lead(s) I III aVR aVL HEART RATE INCREASED SINCE 2/20 Confirmed by Aletta Edouard [phone]) on 03/25/2019 9:13:16 PM   Radiology Dg Chest Port 1 View  Result Date: 03/25/2019 CLINICAL DATA:  Shortness of breath. EXAM: PORTABLE CHEST 1 VIEW COMPARISON:  01/06/2019 FINDINGS: The cardiac silhouette is enlarged. There is a right lower lung zone airspace opacity favored to represent a combination of a right-sided pleural effusion and atelectasis/consolidation. No pneumothorax. There is a likely a trace left-sided effusion. A retrocardiac opacity is noted. Generalized volume overload is seen. IMPRESSION: 1. Interval development of a right lower lung zone opacity favored to represent a combination of a small to moderate size right-sided pleural effusion with adjacent atelectasis. A superimposed infectious process cannot be excluded. 2. Trace to small left-sided pleural  effusion. New retrocardiac opacity which may represent atelectasis or infiltrate. 3. Mild cardiomegaly. There is evidence of generalized volume overload. Electronically Signed   By: Constance Holster M.D.   On: 03/25/2019 20:55    Procedures .Critical Care Performed by: Hayden Rasmussen, MD Authorized by: Hayden Rasmussen, MD   Critical care provider statement:    Critical care time (minutes):  45   Critical care was  necessary to treat or prevent imminent or life-threatening deterioration of the following conditions:  Cardiac failure, metabolic crisis and respiratory failure   Critical care was time spent personally by me on the following activities:  Discussions with consultants, evaluation of patient's response to treatment, examination of patient, ordering and performing treatments and interventions, ordering and review of laboratory studies, ordering and review of radiographic studies, pulse oximetry, re-evaluation of patient's condition, obtaining history from patient or surrogate, review of old charts and development of treatment plan with patient or surrogate   I assumed direction of critical care for this patient from another provider in my specialty: no     (including critical care time)  Medications Ordered in ED Medications  diltiazem (CARDIZEM) 1 mg/mL load via infusion 10 mg (has no administration in time range)    And  diltiazem (CARDIZEM) 100 mg in dextrose 5 % 100 mL (1 mg/mL) infusion (15 mg/hr Intravenous Rate/Dose Change 03/25/19 2115)  sodium chloride 0.9 % bolus 500 mL (500 mLs Intravenous New Bag/Given 03/25/19 2034)  LORazepam (ATIVAN) injection 0.5 mg (0.5 mg Intravenous Given 03/25/19 2034)     Initial Impression / Assessment and Plan / ED Course  I have reviewed the triage vital signs and the nursing notes.  Pertinent labs & imaging results that were available during my care of the patient were reviewed by me and considered in my medical decision making (see chart for details).  Clinical Course as of Mar 24 2306  Thu Mar 25, 2019  2051 Differential diagnosis includes A. fib rapid, ACS, pneumonia, benzodiazepine withdrawal, Covid, sepsis.   [MB]  2052 Reported the patient some fluids and IV Ativan along with the diltiazem drip.   [MB]  2052 Patient states she has been told she has A. fib in the past but I cannot find any documentation of that.   [MB]  2103 Patient's x-ray showing a  new large right effusion with possible infiltrate underneath.  I added on blood cultures BNP lactate and Covid testing for likely admission.   [MB]  2147 Received a call the patient's troponin is elevated at 0.14.  Computer read the EKG as ectopic atrial rhythm.  Currently she is on Cardizem at 15 mg an hour and remains in a heart rate of about 135.  Seems to get a few more labs back him, place a call into cardiology and have them review this EKG.   [MB]  2153 Patient's chemistry is back and her potassium was 2.9.  Have ordered IV repletion of that.  Magnesium normal.  Creatinine slightly elevated from priors.   [MB]  2221 discussed with cardiology fellow at [facility].  They feel it could be an atrial tachycardia but more likely flutter although atypical and with the unclear timing of her onset of symptoms probably should heparinize and may be better served by being at [facility] on the hospitalist service because she may need a TEE for cardioversion.   [MB]  2239 I discussed with Dr. Maudie Mercury the admitting hospitalist.  He will evaluate the patient in the ED for  likely transfer to [facility] on the hospital service there.   [MB]    Clinical Course User Index [MB] Hayden Rasmussen, MD   CHA2DS2/VAS Stroke Risk Points  Current as of a minute ago     5 >= 2 Points: High Risk  1 - 1.99 Points: Medium Risk  0 Points: Low Risk    The patient's score has not changed in the past year.:  No Change     Details    This score determines the patient's risk of having a stroke if the  patient has atrial fibrillation.       Points Metrics  1 Has Congestive Heart Failure:  Yes    Current as of a minute ago  1 Has Vascular Disease:  Yes    Current as of a minute ago  0 Has Hypertension:  No    Current as of a minute ago  2 Age:  72    Current as of a minute ago  0 Has Diabetes:  No    Current as of a minute ago  0 Had Stroke:  No  Had TIA:  No  Had thromboembolism:  No    Current as of a minute ago  1 Female:  Yes     Current as of a minute ago                  Final Clinical Impressions(s) / ED Diagnoses   Final diagnoses:  Shortness of breath  Atrial tachycardia (HCC)  Hypokalemia  Acute congestive heart failure, unspecified heart failure type ([place])  Pleural effusion, right    ED Discharge Orders         Ordered    Amb referral to [facility] Clinic     03/25/19 2031           Hayden Rasmussen, MD 03/26/19 1304

## 2019-03-25 NOTE — Progress Notes (Signed)
ANTICOAGULATION CONSULT NOTE - Initial Consult  Pharmacy Consult for heparin gtt  Indication: atrial fibrillation  Allergies  Allergen Reactions  . Codeine Nausea And Vomiting  . Tramadol Nausea And Vomiting  . Asa [Aspirin] Other (See Comments)    Patient is unaware of allergy  . Azithromycin Other (See Comments)    Patient is unaware of allergy  . Celebrex [Celecoxib] Other (See Comments)    Irritated stomach   . Cymbalta [Duloxetine Hcl] Other (See Comments)    Felt groggy  . Prednisone Rash    She has seen the podiatrist and he had injected cortisone in her feet and she had also taken a peel at home. She had a severe reaction to her face with a rash and had to take Benadryl to resolve the symptom. She actually did get more cortisone shots, but no additional reactions to the shots.  . Vioxx [Rofecoxib] Other (See Comments)    Unknown  . Zelnorm [Tegaserod] Other (See Comments)    Patient is unaware of allergy  . Zocor [Simvastatin] Other (See Comments)    Patient is unaware of allergy    Patient Measurements: Height: 5\' 7"  (170.2 cm) Weight: 125 lb (56.7 kg) IBW/kg (Calculated) : 61.6 Heparin Dosing Weight: HEPARIN DW (KG): 56.7   Vital Signs: Temp: 98.2 F (36.8 C) (05/07 2222) Temp Source: Rectal (05/07 2222) BP: 90/78 (05/07 2222) Pulse Rate: 122 (05/07 2222)  Labs: Recent Labs    03/25/19 2033  HGB 12.7  HCT 43.2  PLT 285  CREATININE 1.21*  TROPONINI 0.14*    Estimated Creatinine Clearance: 34.9 mL/min (A) (by C-G formula based on SCr of 1.21 mg/dL (H)).   Medical History: Past Medical History:  Diagnosis Date  . Anxiety    takes Xanax daily  . Arthritis    back  . Cataract   . Chronic back pain   . Constipation    OTC stool softener prn  . COPD (chronic obstructive pulmonary disease) (HCC)   . Depression   . Diverticulosis   . GERD (gastroesophageal reflux disease)    takes Ranidine daily  . Hemorrhoids   . History of bronchitis   .  History of migraine    many yrs ago  . History of shingles   . Hyperlipidemia   . Insomnia   . Migraines   . Nocturia   . Numbness    left leg  . Osteoporosis   . PONV (postoperative nausea and vomiting)   . Urinary frequency   . Urinary urgency     Medications:  (Not in a hospital admission)  Scheduled:  . diltiazem  10 mg Intravenous Once  . heparin  3,600 Units Intravenous Once   Infusions:  . cefTRIAXone (ROCEPHIN)  IV 1 g (03/25/19 2234)  . diltiazem (CARDIZEM) infusion 15 mg/hr (03/25/19 2115)  . heparin    . potassium chloride     PRN:  Anti-infectives (From admission, onward)   Start     Dose/Rate Route Frequency Ordered Stop   03/25/19 2230  cefTRIAXone (ROCEPHIN) 1 g in sodium chloride 0.9 % 100 mL IVPB     1 g 200 mL/hr over 30 Minutes Intravenous  Once 03/25/19 2225     03/25/19 2230  doxycycline (VIBRA-TABS) tablet 100 mg     100 mg Oral  Once 03/25/19 2225 03/25/19 2234      Assessment: Misty [name] a 78 y.o. female requires anticoagulation with a heparin iv infusion for the indication of  atrial  fibrillation. Heparin gtt will be started following pharmacy protocol per pharmacy consult. Patient is not on previous oral anticoagulant that will require aPTT/HL correlation before transitioning to only HL monitoring.   Goal of Therapy:  Heparin level 0.3-0.7 units/ml Monitor platelets by anticoagulation protocol: Yes   Plan:  Give 3600 units bolus x 1 Start heparin infusion at 900 units/hr Check anti-Xa level in 8 hours and daily while on heparin Continue to monitor H&H and platelets  Heparin level to be drawn in 8 hours for patients >52 years old or crcl < 29ml/min  Gerre Pebbles Ivaan Liddy 03/25/2019,10:36 PM

## 2019-03-25 NOTE — ED Notes (Signed)
Date and time results received: 03/25/19 2342   Test: lactic Critical Value: 2.8  Name of Provider Notified: Selena Batten Orders Received? Or Actions Taken?: n/a

## 2019-03-25 NOTE — H&P (Addendum)
TRH H&P    Patient Demographics:    Misty [name], is a 78 y.o. female  MRN: [id number]  DOB - 24-Jul-1941  Admit Date - 03/25/2019  Referring MD/NP/PA: Aletta Edouard  Outpatient Primary MD for the patient is Chipper Herb, MD  Patient coming from: home  Chief complaint-   dyspnea   HPI:    Misty [name]  is a 78 y.o. female, w Copd, nicotine dep,  Gerd, Hyperlipidemia, Migraines, Anxiety, apparently c/o dyspnea for the past 1 week. Slight cough, unclear if productive. ? Slight palpitation. Pt denies covid contacts, or any recent travel.  Pt slightly somnolent due to having been given ativan by ED. Pt denies fever, chills, cp, n/v, abd pain, diarrhea, brbpr, dysuria.    In ED T 98.2,  P 138  Bp 86/67  Pox 90-98%RA Wt 56.7 kg  EKG Afib at 140, LAD, Q in v1-3  CXR IMPRESSION: 1. Interval development of a right lower lung zone opacity favored to represent a combination of a small to moderate size right-sided pleural effusion with adjacent atelectasis. A superimposed infectious process cannot be excluded. 2. Trace to small left-sided pleural effusion. New retrocardiac opacity which may represent atelectasis or infiltrate. 3. Mild cardiomegaly. There is evidence of generalized volume Overload.  Na 142, K 2.9 Glucose 230 Bun 21, Creatinine 1.21 Trop 0.14 BNP >4,500 covid negative  Wbc 9.3, hgb 12.7, Plt 285 TSH 2.216 Magnesium 2.1 Blood culture x2 pending  Pt placed on cardizem GTT in ED, as well as lasix 66m iv x1, and Kcl 19m iv x4 Pt received rocephin 1gm iv x1 along with doxycycline 10035mo x1 in ED.   Pt will be admitted for Afib/ flutter with RVR, hypotension, ? Mild CHF secondary to Afib, ? pleural effusions/ Hcap, hypokalemia.      Review of systems:    In addition to the HPI above,  No Fever-chills, No Headache, No changes with Vision or hearing, No problems swallowing  food or Liquids, No Chest pain,  No Abdominal pain, No Nausea or Vomiting, bowel movements are regular, No Blood in stool or Urine, No dysuria, No new skin rashes or bruises, No new joints pains-aches,  No new weakness, tingling, numbness in any extremity, No recent weight gain or loss, No polyuria, polydypsia or polyphagia, No significant Mental Stressors.  All other systems reviewed and are negative.    Past History of the following :    Past Medical History:  Diagnosis Date  . Anxiety    takes Xanax daily  . Arthritis    back  . Cataract   . Chronic back pain   . Constipation    OTC stool softener prn  . COPD (chronic obstructive pulmonary disease) (HCCAzle . Depression   . Diverticulosis   . GERD (gastroesophageal reflux disease)    takes Ranidine daily  . Hemorrhoids   . History of bronchitis   . History of migraine    many yrs ago  . History of shingles   . Hyperlipidemia   .  Insomnia   . Migraines   . Nocturia   . Numbness    left leg  . Osteoporosis   . PONV (postoperative nausea and vomiting)   . Urinary frequency   . Urinary urgency       Past Surgical History:  Procedure Laterality Date  . ABDOMINAL HYSTERECTOMY    . COLONOSCOPY    . COLONOSCOPY N/A 02/17/2015   Procedure: COLONOSCOPY;  Surgeon: Rogene Houston, MD;  [facility];  Service: Endoscopy;  Laterality: N/A;  110n - moved to 11:15 - Ann to notify pt  . ESOPHAGOGASTRODUODENOSCOPY    . ESOPHAGOGASTRODUODENOSCOPY N/A 01/29/2016   Procedure: ESOPHAGOGASTRODUODENOSCOPY (EGD);  Surgeon: Rogene Houston, MD;  [facility];  Service: Endoscopy;  Laterality: N/A;  . ESOPHAGOGASTRODUODENOSCOPY N/A 10/14/2017   Procedure: ESOPHAGOGASTRODUODENOSCOPY (EGD);  Surgeon: Rogene Houston, MD;  [facility];  Service: Endoscopy;  Laterality: N/A;  730  . EYE SURGERY Right 3/15   cataracts  . INTRAMEDULLARY (IM) NAIL INTERTROCHANTERIC Left 01/07/2019   Procedure:  INTRAMEDULLARY (IM) NAIL INTERTROCHANTRIC;  Surgeon: Carole Civil, MD;  Location: [facility];  Service: Orthopedics;  Laterality: Left;  . KYPHOPLASTY N/A 07/08/2014   Procedure: Thoracic Eleven Kyphoplasty;  Surgeon: Hosie Spangle, MD;  Location: [place] [facility];  Service: Neurosurgery;  Laterality: N/A;  . LUMBAR LAMINECTOMY/DECOMPRESSION MICRODISCECTOMY Bilateral 05/20/2013   Procedure: LUMBAR LAMINECTOMY/DECOMPRESSION MICRODISCECTOMY 1 LEVEL;  Surgeon: Hosie Spangle, MD;  Location: [place] [facility];  Service: Neurosurgery;  Laterality: Bilateral;  Bilateral Lumbar four-five laminotomy and left lumbar four-five microdiskectomy  . SPINE SURGERY     Dr Carloyn Manner -  vertebraplasty      Social History:      Social History   Tobacco Use  . Smoking status: Current Some Day Smoker    Packs/day: 1.00    Years: 52.00    Pack years: 52.00    Types: Cigarettes    Start date: 11/18/1958    Last attempt to quit: 08/20/2011    Years since quitting: 7.6  . Smokeless tobacco: Never Used  . Tobacco comment: 1/2-1pack off and on all her life  Substance Use Topics  . Alcohol use: No    Alcohol/week: 0.0 standard drinks       Family History :     Family History  Problem Relation Age of Onset  . Hypertension Mother   . Macular degeneration Father   . Heart disease Father   . Cirrhosis Son   . Heart disease Son   . Colon cancer Neg Hx        Home Medications:   Prior to Admission medications   Medication Sig Start Date End Date Taking? Authorizing Provider  albuterol (PROVENTIL HFA;VENTOLIN HFA) 108 (90 Base) MCG/ACT inhaler Inhale 2 puffs into the lungs every 6 (six) hours as needed for wheezing or shortness of breath (cough). 01/28/19  Yes Gerlene Fee, NP  acetaminophen (TYLENOL) 500 MG tablet Take 500 mg by mouth every 8 (eight) hours.  01/20/19   [provider]  aspirin EC 325 MG tablet Take 1 tablet (325 mg total) by mouth daily. 01/12/19 01/12/20  Kathie Dike, MD   Balsam [place]-Castor Oil [facility]) OINT Apply topically to Sacrum and bilateral buttocks every shift and as needed for prevention 01/13/19   [provider]  cholecalciferol (VITAMIN D) 1000 UNITS tablet Take 2,000-4,000 Units by mouth daily. Take 2000 units daily except take 4000 units on Saturday and Sunday.  [provider]  docusate sodium (COLACE) 100 MG capsule Take 2 capsules (200 mg total) by mouth 2 (two) times daily. 01/12/19   Kathie Dike, MD  escitalopram (LEXAPRO) 20 MG tablet Take 1 tablet (20 mg total) by mouth at bedtime. 01/28/19   Gerlene Fee, NP  FeFum-FePoly-FA-B Cmp-C-Biot (INTEGRA PLUS) CAPS Take 1 capsule by mouth daily. 01/28/19   Gerlene Fee, NP  ferrous sulfate (FEOSOL) 325 (65 FE) MG tablet Take 1 tablet (325 mg total) by mouth 3 (three) times daily with meals. 01/28/19   Gerlene Fee, NP  Lidocaine 4 % PTCH Apply 1 patch to left hip and 2nd patch to lumbar back and remove at [facility] for pain management 01/21/19   [provider]  magic mouthwash w/lidocaine SOLN Take 5 mLs by mouth 3 (three) times daily as needed for mouth pain. 03/23/19   Terald Sleeper, PA-C  Multiple Vitamin (MULTIVITAMIN WITH MINERALS) TABS tablet Take 1 tablet by mouth daily. Centrum Silver    [provider]  NON FORMULARY Diet Type: NAS 01/12/19   [provider]  Nutritional Supplements (ENSURE ENLIVE PO) Take 1 Bottle by mouth 2 (two) times daily between meals. 01/13/19   [provider]  nystatin-triamcinolone ointment (MYCOLOG) Apply 1 application topically 2 (two) times daily. 02/12/19   Chipper Herb, MD  pantoprazole (PROTONIX) 40 MG tablet Take 1 tablet (40 mg total) by mouth 2 (two) times daily before a meal. 01/28/19   Nyoka Cowden, Phylis Bougie, NP  vitamin B-12 (CYANOCOBALAMIN) 1000 MCG tablet Take 1,000 mcg by mouth daily.    [provider]     Allergies:     Allergies  Allergen Reactions  . Codeine Nausea And Vomiting  .  Tramadol Nausea And Vomiting  . Asa [Aspirin] Other (See Comments)    Patient is unaware of allergy  . Azithromycin Other (See Comments)    Patient is unaware of allergy  . Celebrex [Celecoxib] Other (See Comments)    Irritated stomach   . Cymbalta [Duloxetine Hcl] Other (See Comments)    Felt groggy  . Prednisone Rash    She has seen the podiatrist and he had injected cortisone in her feet and she had also taken a peel at home. She had a severe reaction to her face with a rash and had to take Benadryl to resolve the symptom. She actually did get more cortisone shots, but no additional reactions to the shots.  . Vioxx [Rofecoxib] Other (See Comments)    Unknown  . Zelnorm [Tegaserod] Other (See Comments)    Patient is unaware of allergy  . Zocor [Simvastatin] Other (See Comments)    Patient is unaware of allergy     Physical Exam:   Vitals  Blood pressure 106/78, pulse 95, temperature 98.2 F (36.8 C), temperature source Rectal, resp. rate 19, height 5' 7"  (1.702 m), weight 56.7 kg, SpO2 91 %.  1.  General: axox3  2. Psychiatric: euthymic  3. Neurologic: cn2-12 intact, reflexes 2+ symmetric, diffuse with no clonus, motor 5/5 in all 4 ext  4. HEENMT:  Anicteric, pupils 1.67m symmetric, direct, consensual, near intact, eomi Mmm Neck: no jvd, no bruit  5. Respiratory : Slight crackles left lung base,  Decrease bs right lung base, no wheezing  6. Cardiovascular : Irr, irr, s1, s2, no m/g/r  7. Gastrointestinal:  Abd: soft, nt, nd, +bs  8. Skin:  Ext: no c/c/e, no rash  9.Musculoskeletal:  Good ROM  No adenopathy  Data Review:    CBC Recent Labs  Lab 03/25/19 2033  WBC 9.3  HGB 12.7  HCT 43.2  PLT 285  MCV 97.7  MCH 28.7  MCHC 29.4*  RDW 17.1*  LYMPHSABS 2.5  MONOABS 0.5  EOSABS 0.0  BASOSABS 0.0   ------------------------------------------------------------------------------------------------------------------  Results for orders placed or  performed during the hospital encounter of 03/25/19 (from the past 48 hour(s))  Basic metabolic panel     Status: Abnormal   Collection Time: 03/25/19  8:33 PM  Result Value Ref Range   Sodium 142 135 - 145 mmol/L   Potassium 2.9 (L) 3.5 - 5.1 mmol/L   Chloride 95 (L) 98 - 111 mmol/L   CO2 24 22 - 32 mmol/L   Glucose, Bld 230 (H) 70 - 99 mg/dL   BUN 21 8 - 23 mg/dL   Creatinine, Ser 1.21 (H) 0.44 - 1.00 mg/dL   Calcium 8.6 (L) 8.9 - 10.3 mg/dL   GFR calc non Af Amer 43 (L) >60 mL/min   GFR calc Af Amer 50 (L) >60 mL/min   Anion gap >20 (H) 5 - 15    Comment: Performed at [facility], [address] [place], [place] [address]  Troponin I - Once     Status: Abnormal   Collection Time: 03/25/19  8:33 PM  Result Value Ref Range   Troponin I 0.14 (HH) <0.03 ng/mL    Comment: CRITICAL RESULT CALLED TO, READ BACK BY AND VERIFIED WITH: TALBOTT,T ON 03/25/19 AT 2145 BY LOY,C Performed at [facility], [address] [place], [place] [address]   CBC with Differential     Status: Abnormal   Collection Time: 03/25/19  8:33 PM  Result Value Ref Range   WBC 9.3 4.0 - 10.5 K/uL   RBC 4.42 3.87 - 5.11 MIL/uL   Hemoglobin 12.7 12.0 - 15.0 g/dL   HCT 43.2 36.0 - 46.0 %   MCV 97.7 80.0 - 100.0 fL   MCH 28.7 26.0 - 34.0 pg   MCHC 29.4 (L) 30.0 - 36.0 g/dL   RDW 17.1 (H) 11.5 - 15.5 %   Platelets 285 150 - 400 K/uL   nRBC 0.3 (H) 0.0 - 0.2 %   Neutrophils Relative % 66 %   Neutro Abs 6.2 1.7 - 7.7 K/uL   Lymphocytes Relative 27 %   Lymphs Abs 2.5 0.7 - 4.0 K/uL   Monocytes Relative 6 %   Monocytes Absolute 0.5 0.1 - 1.0 K/uL   Eosinophils Relative 0 %   Eosinophils Absolute 0.0 0.0 - 0.5 K/uL   Basophils Relative 0 %   Basophils Absolute 0.0 0.0 - 0.1 K/uL   Immature Granulocytes 1 %   Abs Immature Granulocytes 0.06 0.00 - 0.07 K/uL    Comment: Performed at [facility], [address], [place], [place] [address]  TSH     Status: None   Collection Time: 03/25/19  8:33 PM  Result Value  Ref Range   TSH 2.216 0.350 - 4.500 uIU/mL    Comment: Performed by a 3rd Generation assay with a functional sensitivity of <=0.01 uIU/mL. Performed at [facility], [address], [place], [place] [address]   Magnesium     Status: None   Collection Time: 03/25/19  8:33 PM  Result Value Ref Range   Magnesium 2.1 1.7 - 2.4 mg/dL    Comment: Performed at [facility], [address], [place],  [address]  Brain natriuretic peptide     Status: Abnormal  Collection Time: 03/25/19  8:33 PM  Result Value Ref Range   B Natriuretic Peptide >4,500.0 (H) 0.0 - 100.0 pg/mL    Comment: Performed at [facility], [address] [place], [place] [address]  SARS Coronavirus 2 (CEPHEID- Performed in [place] hospital lab), Hosp Order     Status: None   Collection Time: 03/25/19  8:58 PM  Result Value Ref Range   SARS Coronavirus 2 NEGATIVE NEGATIVE    Comment: (NOTE) If result is NEGATIVE SARS-CoV-2 target nucleic acids are NOT DETECTED. The SARS-CoV-2 RNA is generally detectable in upper and lower  respiratory specimens during the acute phase of infection. The lowest  concentration of SARS-CoV-2 viral copies this assay can detect is 250  copies / mL. A negative result does not preclude SARS-CoV-2 infection  and should not be used as the sole basis for treatment or other  patient management decisions.  A negative result may occur with  improper specimen collection / handling, submission of specimen other  than nasopharyngeal swab, presence of viral mutation(s) within the  areas targeted by this assay, and inadequate number of viral copies  (<250 copies / mL). A negative result must be combined with clinical  observations, patient history, and epidemiological information. If result is POSITIVE SARS-CoV-2 target nucleic acids are DETECTED. The SARS-CoV-2 RNA is generally detectable in upper and lower  respiratory specimens dur ing the acute phase of infection.  Positive  results are  indicative of active infection with SARS-CoV-2.  Clinical  correlation with patient history and other diagnostic information is  necessary to determine patient infection status.  Positive results do  not rule out bacterial infection or co-infection with other viruses. If result is PRESUMPTIVE POSTIVE SARS-CoV-2 nucleic acids MAY BE PRESENT.   A presumptive positive result was obtained on the submitted specimen  and confirmed on repeat testing.  While 2019 novel coronavirus  (SARS-CoV-2) nucleic acids may be present in the submitted sample  additional confirmatory testing may be necessary for epidemiological  and / or clinical management purposes  to differentiate between  SARS-CoV-2 and other Sarbecovirus currently known to infect humans.  If clinically indicated additional testing with an alternate test  methodology [phone]) is advised. The SARS-CoV-2 RNA is generally  detectable in upper and lower respiratory sp ecimens during the acute  phase of infection. The expected result is Negative. Fact Sheet for Patients:  StrictlyIdeas.no Fact Sheet for Healthcare Providers: BankingDealers.co.za This test is not yet approved or cleared by the [place] FDA and has been authorized for detection and/or diagnosis of SARS-CoV-2 by FDA under an Emergency Use Authorization (EUA).  This EUA will remain in effect (meaning this test can be used) for the duration of the COVID-19 declaration under Section 564(b)(1) of the Act, 21 U.S.C. section 360bbb-3(b)(1), unless the authorization is terminated or revoked sooner. Performed at [facility], [address] [place], [place] [address]   Lactic acid, plasma     Status: Abnormal   Collection Time: 03/25/19  9:22 PM  Result Value Ref Range   Lactic Acid, Venous 5.8 (HH) 0.5 - 1.9 mmol/L    Comment: CRITICAL RESULT CALLED TO, READ BACK BY AND VERIFIED WITH: TALBIT,T AT 2215 ON 5.7.20 BY ISLEY,B   Performed at [facility], [address] [place], [place] [address]   D-dimer, quantitative (not at [facility]     Status: Abnormal   Collection Time: 03/25/19  9:22 PM  Result Value Ref Range   D-Dimer, Quant 1.69 (H) 0.00 -  0.50 ug/mL-FEU    Comment: (NOTE) At the manufacturer cut-off of 0.50 ug/mL FEU, this assay has been documented to exclude PE with a sensitivity and negative predictive value of 97 to 99%.  At this time, this assay has not been approved by the FDA to exclude DVT/VTE. Results should be correlated with clinical presentation. Performed at [facility], [address] [place], [place] [address]   Lactic acid, plasma     Status: Abnormal   Collection Time: 03/25/19 11:03 PM  Result Value Ref Range   Lactic Acid, Venous 2.8 (HH) 0.5 - 1.9 mmol/L    Comment: CRITICAL RESULT CALLED TO, READ BACK BY AND VERIFIED WITH: B CRABTREE,RN @2333  03/25/19 [facility] Performed at [facility], [address], [place], [place] [address]   Troponin I - Now Then Q6H     Status: Abnormal   Collection Time: 03/25/19 11:03 PM  Result Value Ref Range   Troponin I 0.11 (HH) <0.03 ng/mL    Comment: CRITICAL VALUE NOTED.  VALUE IS CONSISTENT WITH PREVIOUSLY REPORTED AND CALLED VALUE. Performed at [facility], [address] [place], [place] [address]     Chemistries  Recent Labs  Lab 03/25/19 2033  NA 142  K 2.9*  CL 95*  CO2 24  GLUCOSE 230*  BUN 21  CREATININE 1.21*  CALCIUM 8.6*  MG 2.1   ------------------------------------------------------------------------------------------------------------------  ------------------------------------------------------------------------------------------------------------------ GFR: Estimated Creatinine Clearance: 34.9 mL/min (A) (by C-G formula based on SCr of 1.21 mg/dL (H)). Liver Function Tests: No results for input(s): AST, ALT, ALKPHOS, BILITOT, PROT, ALBUMIN in the last 168 hours. No results for input(s): LIPASE, AMYLASE in the  last 168 hours. No results for input(s): AMMONIA in the last 168 hours. Coagulation Profile: No results for input(s): INR, PROTIME in the last 168 hours. Cardiac Enzymes: Recent Labs  Lab 03/25/19 2033 03/25/19 2303  TROPONINI 0.14* 0.11*   BNP (last 3 results) No results for input(s): PROBNP in the last 8760 hours. HbA1C: No results for input(s): HGBA1C in the last 72 hours. CBG: No results for input(s): GLUCAP in the last 168 hours. Lipid Profile: No results for input(s): CHOL, HDL, LDLCALC, TRIG, CHOLHDL, LDLDIRECT in the last 72 hours. Thyroid Function Tests: Recent Labs    03/25/19 2033  TSH 2.216   Anemia Panel: No results for input(s): VITAMINB12, FOLATE, FERRITIN, TIBC, IRON, RETICCTPCT in the last 72 hours.  --------------------------------------------------------------------------------------------------------------- Urine analysis:    Component Value Date/Time   COLORURINE YELLOW 01/14/2019 0226   APPEARANCEUR CLEAR 01/14/2019 0226   LABSPEC 1.012 01/14/2019 0226   PHURINE 7.0 01/14/2019 0226   GLUCOSEU NEGATIVE 01/14/2019 0226   HGBUR NEGATIVE 01/14/2019 0226   BILIRUBINUR NEGATIVE 01/14/2019 0226   KETONESUR NEGATIVE 01/14/2019 0226   PROTEINUR NEGATIVE 01/14/2019 0226   NITRITE NEGATIVE 01/14/2019 0226   LEUKOCYTESUR NEGATIVE 01/14/2019 0226      Imaging Results:    Dg Chest Port 1 View  Result Date: 03/25/2019 CLINICAL DATA:  Shortness of breath. EXAM: PORTABLE CHEST 1 VIEW COMPARISON:  01/06/2019 FINDINGS: The cardiac silhouette is enlarged. There is a right lower lung zone airspace opacity favored to represent a combination of a right-sided pleural effusion and atelectasis/consolidation. No pneumothorax. There is a likely a trace left-sided effusion. A retrocardiac opacity is noted. Generalized volume overload is seen. IMPRESSION: 1. Interval development of a right lower lung zone opacity favored to represent a combination of a small to moderate size  right-sided pleural effusion with adjacent atelectasis. A superimposed infectious process cannot be  excluded. 2. Trace to small left-sided pleural effusion. New retrocardiac opacity which may represent atelectasis or infiltrate. 3. Mild cardiomegaly. There is evidence of generalized volume overload. Electronically Signed   By: Constance Holster M.D.   On: 03/25/2019 20:55       Assessment & Plan:    Principal Problem:   Atrial fibrillation with RVR (HCC) Active Problems:   Hypokalemia   Iron deficiency anemia   Elevated troponin  Afib/ flutter with RVR Tele Trop I q6h x3 Check TSH  Check D dimer, if positive then CTA chest r/o PE Check cardiac echo Cont Cardizem GTT Heparin GTT Cardiology consulted by ED, recommended transfer to [facility] Please consult in AM  Elevated trop, likely demand ischemia Tele Trop I q6h x3 Check cardiac echo as above If Troponin trending up, then may need further w/up  Dyspnea ddx Afib (Chads2vasc=3)/ flutter with RVR, Hcap, pleural effusion, Copd Blood culture x2 Check urine legionella, strep antigen vanco iv, cefepime iv pharmacy to dose  Hypokalemia Check magnesium in am Check cmp in am  H/o Thrush Cont Nystatin  H/o Anemia, iron def Cont Ferrous sulfate  Gerd Cont PPI       DVT Prophylaxis-   Heparin GTT  AM Labs Ordered, also please review Full Orders  Family Communication: Admission, patients condition and plan of care including tests being ordered have been discussed with the patient  who indicate understanding and agree with the plan and Code Status.  Code Status:  FULL CODE  Admission status: Inpatient: Based on patients clinical presentation and evaluation of above clinical data, I have made determination that patient meets Inpatient criteria at this time.  Pt has afib/ flutter with RVR, requires cardizem GTT, as well as heparin, further w/up may include ablation if needed per ED (as per cardiology fellow), as well as  management of ? Hcap/ pleural effusions, will place in stepdown  Time spent in minutes : 60 minutes critical care   Jani Gravel M.D on 03/26/2019 at 12:28 AM

## 2019-03-25 NOTE — ED Notes (Signed)
Inetta Fermo RN gave report to [facility]

## 2019-03-25 NOTE — ED Notes (Signed)
Date and time results received: 03/25/19 2146 (use smartphrase ".now" to insert current time)  Test: troponin Critical Value: 0.14  Name of Provider Notified: Dr. Charm Barges notified at 2146  Orders Received? Or Actions Taken?: na/no

## 2019-03-25 NOTE — ED Triage Notes (Signed)
Pt brought in by [facility] for c/o sob that started this week

## 2019-03-26 ENCOUNTER — Telehealth: Payer: Self-pay | Admitting: Cardiology

## 2019-03-26 ENCOUNTER — Inpatient Hospital Stay (HOSPITAL_COMMUNITY): Payer: Medicare Other

## 2019-03-26 ENCOUNTER — Encounter (HOSPITAL_COMMUNITY): Payer: Self-pay | Admitting: Cardiology

## 2019-03-26 DIAGNOSIS — I471 Supraventricular tachycardia: Secondary | ICD-10-CM

## 2019-03-26 DIAGNOSIS — J189 Pneumonia, unspecified organism: Secondary | ICD-10-CM | POA: Diagnosis present

## 2019-03-26 DIAGNOSIS — R7989 Other specified abnormal findings of blood chemistry: Secondary | ICD-10-CM

## 2019-03-26 DIAGNOSIS — I509 Heart failure, unspecified: Secondary | ICD-10-CM

## 2019-03-26 DIAGNOSIS — I4891 Unspecified atrial fibrillation: Secondary | ICD-10-CM

## 2019-03-26 DIAGNOSIS — I361 Nonrheumatic tricuspid (valve) insufficiency: Secondary | ICD-10-CM

## 2019-03-26 DIAGNOSIS — I340 Nonrheumatic mitral (valve) insufficiency: Secondary | ICD-10-CM

## 2019-03-26 LAB — ECHOCARDIOGRAM LIMITED
Height: 67 in
Weight: 2000 oz

## 2019-03-26 LAB — CBC
HCT: 35.4 % — ABNORMAL LOW (ref 36.0–46.0)
Hemoglobin: 10.9 g/dL — ABNORMAL LOW (ref 12.0–15.0)
MCH: 28.4 pg (ref 26.0–34.0)
MCHC: 30.8 g/dL (ref 30.0–36.0)
MCV: 92.2 fL (ref 80.0–100.0)
Platelets: 237 K/uL (ref 150–400)
RBC: 3.84 MIL/uL — ABNORMAL LOW (ref 3.87–5.11)
RDW: 16.7 % — ABNORMAL HIGH (ref 11.5–15.5)
WBC: 8.7 K/uL (ref 4.0–10.5)
nRBC: 0 % (ref 0.0–0.2)

## 2019-03-26 LAB — TROPONIN I
Troponin I: 0.15 ng/mL (ref <0.03)
Troponin I: 0.15 ng/mL (ref <0.03)
Troponin I: 0.14 ng/mL (ref <0.03)

## 2019-03-26 LAB — LIPID PANEL
Cholesterol: 89 mg/dL (ref 0–200)
HDL: 33 mg/dL — ABNORMAL LOW (ref >40)
LDL Cholesterol: 43 mg/dL (ref 0–99)
Total CHOL/HDL Ratio: 2.7 RATIO
Triglycerides: 65 mg/dL (ref <150)
VLDL: 13 mg/dL (ref 0–40)

## 2019-03-26 LAB — HEPARIN LEVEL (UNFRACTIONATED)
Heparin Unfractionated: 0.32 [IU]/mL (ref 0.30–0.70)
Heparin Unfractionated: 0.37 [IU]/mL (ref 0.30–0.70)

## 2019-03-26 LAB — HEMOGLOBIN A1C
Hgb A1c MFr Bld: 5.4 % (ref 4.8–5.6)
Mean Plasma Glucose: 108.28 mg/dL

## 2019-03-26 MED ORDER — VANCOMYCIN HCL IN DEXTROSE 1-5 GM/200ML-% IV SOLN
1000.0000 mg | INTRAVENOUS | Status: DC
  Administered 2019-03-27: 1000 mg via INTRAVENOUS
  Filled 2019-03-26: qty 200

## 2019-03-26 MED ORDER — SODIUM CHLORIDE 0.9 % IV SOLN
1.0000 g | INTRAVENOUS | Status: DC
  Filled 2019-03-26: qty 1

## 2019-03-26 MED ORDER — SODIUM CHLORIDE 0.9 % IV SOLN
2.0000 g | Freq: Once | INTRAVENOUS | Status: AC
  Administered 2019-03-26: 2 g via INTRAVENOUS
  Filled 2019-03-26: qty 2

## 2019-03-26 MED ORDER — NYSTATIN-TRIAMCINOLONE 100000-0.1 UNIT/GM-% EX OINT
1.0000 "application " | TOPICAL_OINTMENT | Freq: Two times a day (BID) | CUTANEOUS | Status: DC
  Administered 2019-03-26 – 2019-04-02 (×8): 1 via TOPICAL
  Filled 2019-03-26: qty 15

## 2019-03-26 MED ORDER — MAGIC MOUTHWASH W/LIDOCAINE
5.0000 mL | Freq: Three times a day (TID) | ORAL | Status: DC | PRN
  Administered 2019-03-26 – 2019-03-30 (×3): 5 mL via ORAL
  Filled 2019-03-26 (×4): qty 5

## 2019-03-26 MED ORDER — PANTOPRAZOLE SODIUM 40 MG PO TBEC
40.0000 mg | DELAYED_RELEASE_TABLET | Freq: Two times a day (BID) | ORAL | Status: DC
  Administered 2019-03-26 – 2019-04-02 (×14): 40 mg via ORAL
  Filled 2019-03-26 (×14): qty 1

## 2019-03-26 MED ORDER — ALPRAZOLAM 0.5 MG PO TABS: 0.5000 mg | ORAL_TABLET | Freq: Three times a day (TID) | ORAL | Status: DC

## 2019-03-26 MED ORDER — VANCOMYCIN HCL IN DEXTROSE 1-5 GM/200ML-% IV SOLN
1000.0000 mg | Freq: Once | INTRAVENOUS | Status: AC
  Administered 2019-03-26: 1000 mg via INTRAVENOUS
  Filled 2019-03-26: qty 200

## 2019-03-26 MED ORDER — VITAMIN B-12 1000 MCG PO TABS
1000.0000 ug | ORAL_TABLET | Freq: Every day | ORAL | Status: DC
  Administered 2019-03-26 – 2019-04-02 (×8): 1000 ug via ORAL
  Filled 2019-03-26 (×8): qty 1

## 2019-03-26 MED ORDER — ADULT MULTIVITAMIN W/MINERALS CH
1.0000 | ORAL_TABLET | Freq: Every day | ORAL | Status: DC
  Administered 2019-03-26 – 2019-04-02 (×8): 1 via ORAL
  Filled 2019-03-26 (×8): qty 1

## 2019-03-26 MED ORDER — POTASSIUM CHLORIDE 10 MEQ/100ML IV SOLN
10.0000 meq | INTRAVENOUS | Status: AC
  Administered 2019-03-26 (×2): 10 meq via INTRAVENOUS
  Filled 2019-03-26: qty 100

## 2019-03-26 MED ORDER — FE FUMARATE-B12-VIT C-FA-IFC PO CAPS
1.0000 | ORAL_CAPSULE | Freq: Every day | ORAL | Status: DC
  Administered 2019-03-26 – 2019-04-02 (×8): 1 via ORAL
  Filled 2019-03-26 (×8): qty 1

## 2019-03-26 MED ORDER — ENSURE ENLIVE PO LIQD
237.0000 mL | Freq: Two times a day (BID) | ORAL | Status: DC
  Administered 2019-03-26 – 2019-04-02 (×9): 237 mL via ORAL

## 2019-03-26 MED ORDER — DOCUSATE SODIUM 100 MG PO CAPS
200.0000 mg | ORAL_CAPSULE | Freq: Two times a day (BID) | ORAL | Status: DC
  Administered 2019-03-26 – 2019-04-01 (×6): 200 mg via ORAL
  Filled 2019-03-26 (×11): qty 2

## 2019-03-26 MED ORDER — ALBUTEROL SULFATE (2.5 MG/3ML) 0.083% IN NEBU
2.5000 mg | INHALATION_SOLUTION | Freq: Four times a day (QID) | RESPIRATORY_TRACT | Status: DC | PRN
  Administered 2019-03-27: 2.5 mg via RESPIRATORY_TRACT
  Filled 2019-03-26: qty 3

## 2019-03-26 MED ORDER — ESCITALOPRAM OXALATE 10 MG PO TABS
20.0000 mg | ORAL_TABLET | Freq: Every day | ORAL | Status: DC
  Administered 2019-03-26 – 2019-04-01 (×8): 20 mg via ORAL
  Filled 2019-03-26 (×8): qty 2

## 2019-03-26 MED ORDER — INTEGRA PLUS PO CAPS: 1.0000 | ORAL_CAPSULE | Freq: Every day | ORAL | Status: DC

## 2019-03-26 MED ORDER — DILTIAZEM HCL-DEXTROSE 100-5 MG/100ML-% IV SOLN (PREMIX)
5.0000 mg/h | INTRAVENOUS | Status: DC
  Administered 2019-03-26 – 2019-03-27 (×4): 15 mg/h via INTRAVENOUS
  Administered 2019-03-27: 10 mg/h via INTRAVENOUS
  Filled 2019-03-26 (×6): qty 100

## 2019-03-26 MED ORDER — ALPRAZOLAM 0.5 MG PO TABS
0.5000 mg | ORAL_TABLET | Freq: Three times a day (TID) | ORAL | Status: DC
  Administered 2019-03-26 – 2019-04-02 (×20): 0.5 mg via ORAL
  Filled 2019-03-26 (×22): qty 1

## 2019-03-26 NOTE — Progress Notes (Signed)
Pharmacy Antibiotic Note  Misty [name] is a 78 y.o. female admitted on 03/25/2019 with pneumonia.  Pharmacy has been consulted for vancomycin and cefepime dosing.  Plan: Vancomycin 1000 mg IV every 36 hours.  Goal trough 15-20 mcg/mL. Cefepime 1g q 24 hrs  Height: 5\' 7"  (170.2 cm) Weight: 125 lb (56.7 kg) IBW/kg (Calculated) : 61.6  Temp (24hrs), Avg:97.8 F (36.6 C), Min:97.5 F (36.4 C), Max:98.2 F (36.8 C)  Recent Labs  Lab 03/25/19 2033 03/25/19 2122 03/25/19 2303 03/26/19 0544  WBC 9.3  --   --  8.7  CREATININE 1.21*  --   --   --   LATICACIDVEN  --  5.8* 2.8*  --     Estimated Creatinine Clearance: 34.9 mL/min (A) (by C-G formula based on SCr of 1.21 mg/dL (H)).    Allergies  Allergen Reactions  . Codeine Nausea And Vomiting  . Tramadol Nausea And Vomiting  . Asa [Aspirin] Other (See Comments)    Patient is unaware of allergy  . Azithromycin Other (See Comments)    Patient is unaware of allergy  . Celebrex [Celecoxib] Other (See Comments)    Irritated stomach   . Cymbalta [Duloxetine Hcl] Other (See Comments)    Felt groggy  . Prednisone Rash    She has seen the podiatrist and he had injected cortisone in her feet and she had also taken a peel at home. She had a severe reaction to her face with a rash and had to take Benadryl to resolve the symptom. She actually did get more cortisone shots, but no additional reactions to the shots.  . Vioxx [Rofecoxib] Other (See Comments)    Unknown  . Zelnorm [Tegaserod] Other (See Comments)    Patient is unaware of allergy  . Zocor [Simvastatin] Other (See Comments)    Patient is unaware of allergy    Antimicrobials this admission:  Cefepime 5/8>  Vanco 5/8 >   Dose adjustments this admission:   Microbiology results:  5/7 BCx x 2: pending 5/7 COVID: neg  Thank you for allowing pharmacy to be a part of this patient's care.  Jenetta Downer, [facility] Clinical Pharmacist Phone [phone]  03/26/2019 10:39 AM

## 2019-03-26 NOTE — Patient Instructions (Signed)
Pt to go to ED for evaluation and treatment

## 2019-03-26 NOTE — Plan of Care (Signed)
  Problem: Cardiovascular: Goal: Ability to achieve and maintain adequate cardiovascular perfusion will improve Outcome: Progressing Goal: Vascular access site(s) Level 0-1 will be maintained Outcome: Progressing   

## 2019-03-26 NOTE — Progress Notes (Addendum)
[facility]                                                                              Patient Demographics  Misty [name], is a 78 y.o. female, DOB - 04/15/1941, QIO:[id number]  Admit date - 03/25/2019   Admitting Physician Pearson Grippe, MD  Outpatient Primary MD for the patient is Ernestina Penna, MD  Outpatient specialists:   LOS - 1  days   Medical records reviewed and are as summarized below:    Chief Complaint  Patient presents with  . Shortness of Breath       Brief summary   Patient is a 78 year old female with COPD, nicotine dependence, hyperlipidemia, GERD, anxiety presented with dyspnea for past 1 week.  Patient reported slight cough, slight palpitations.  Otherwise denied fever chills chest pain nausea vomiting.  In ED, patient was noted to be in rapid atrial fibrillation with heart rate in 130s -140s.  EKG showed heart rate of 140, LAD, Q waves in V1 to V3 Chest x-ray showed interval development of right lower lung zone opacity favored heart rate size right-sided pleural effusion, chest atelectasis, superimposed infectious process cannot be excluded, trace to small left-sided pleural effusion.  New retrocardiac opacity may represent atelectasis or infiltrates.  Patient was started on Cardizem drip in ED. K 2.9, BNP> 4500  COVID negative  Assessment & Plan    Principal Problem:   Atrial fibrillation with RVR (HCC)-new onset -Heart rate in low 100s, on Cardizem drip, also quite anxious, takes Xanax 3 times daily at home -Continue Cardizem drip, heparin drip, cardiology consulted -Troponin elevated 0.14-> 0.11, on heparin drip -BNP> 4500, d-dimer 1.69 -CT angiogram chest pending to rule out PE -TSH 2.2 -Will await recommendations from cardiology regarding anticoagulation.  Patient had one mechanical fall in 12/2018 due to missed step fracture, status post IM nail surgery, otherwise she denies history of any falls. Addendum 3:45 PM 2D echo showed  EF of 30 to 35%, in A. fib, diffuse hypokinesis.   -2D echo should be repeated when converted to normal sinus rhythm or when heart rate is controlled.  Active Problems: COPD, community-acquired pneumonia -Currently on vancomycin and cefepime -No wheezing, COVID negative -Sats stable 92% on 2 L -Follow blood cultures  Elevated troponin -Likely due to #1, currently on heparin drip, cardiology consulted    GERD (gastroesophageal reflux disease) -Continue PPI    Malnutrition of moderate degree -Placed on nutritional supplements    Hypokalemia -Was replaced, recheck bmet    Anxiety and depression -Very anxious at this time, takes scheduled Xanax at home, restarted  History of iron deficiency anemia -Hemoglobin stable, at baseline, follow closely  Lactic acidosis -Improving  Bilateral pleural effusion -Likely precipitated due to #1 and pneumonia -BNP elevated, follow 2D echo, may have underlying CHF   Code Status: Full code DVT Prophylaxis: Heparin drip Family Communication: Discussed in detail with the patient, all imaging results, lab results explained to the patient and patient's husband on the phone   Disposition Plan: On Cardizem drip and heparin drip, heart rate still not controlled, high risk of decompensation, continue progressive care  Time  Spent in minutes   35 minutes  Procedures:  2D echo results pending  Consultants:   Cardiology  Antimicrobials:   Anti-infectives (From admission, onward)   Start     Dose/Rate Route Frequency Ordered Stop   03/27/19 2000  vancomycin (VANCOCIN) IVPB 1000 mg/200 mL premix     1,000 mg 200 mL/hr over 60 Minutes Intravenous Every 36 hours 03/26/19 1029     03/27/19 1000  ceFEPIme (MAXIPIME) 1 g in sodium chloride 0.9 % 100 mL IVPB     1 g 200 mL/hr over 30 Minutes Intravenous Every 24 hours 03/26/19 1029     03/26/19 0115  vancomycin (VANCOCIN) IVPB 1000 mg/200 mL premix     1,000 mg 200 mL/hr over 60 Minutes  Intravenous  Once 03/26/19 0101 03/26/19 0915   03/26/19 0115  ceFEPIme (MAXIPIME) 2 g in sodium chloride 0.9 % 100 mL IVPB     2 g 200 mL/hr over 30 Minutes Intravenous  Once 03/26/19 0101 03/26/19 1001   03/25/19 2230  cefTRIAXone (ROCEPHIN) 1 g in sodium chloride 0.9 % 100 mL IVPB     1 g 200 mL/hr over 30 Minutes Intravenous  Once 03/25/19 2225 03/25/19 2318   03/25/19 2230  doxycycline (VIBRA-TABS) tablet 100 mg     100 mg Oral  Once 03/25/19 2225 03/25/19 2234         Medications  Scheduled Meds: . ALPRAZolam  0.5 mg Oral TID  . diltiazem  10 mg Intravenous Once  . docusate sodium  200 mg Oral BID  . escitalopram  20 mg Oral QHS  . feeding supplement (ENSURE ENLIVE)  237 mL Oral BID BM  . ferrous fumarate-b12-vitamic C-folic acid  1 capsule Oral QAC breakfast  . multivitamin with minerals  1 tablet Oral Daily  . nystatin-triamcinolone ointment  1 application Topical BID  . pantoprazole  40 mg Oral BID AC  . sodium chloride flush  3 mL Intravenous Q12H  . vitamin B-12  1,000 mcg Oral Daily   Continuous Infusions: . sodium chloride    . [START ON 03/27/2019] ceFEPime (MAXIPIME) IV    . diltiazem (CARDIZEM) infusion 15 mg/hr (03/26/19 0411)  . heparin 900 Units/hr (03/26/19 0028)  . [START ON 03/27/2019] vancomycin     PRN Meds:.sodium chloride, acetaminophen **OR** acetaminophen, albuterol, magic mouthwash w/lidocaine, sodium chloride flush      Subjective:   Misty [name] was seen and examined today.  Very anxious, wants Xanax.  No other acute complaints, no chest pain or shortness of breath.  No fevers or chills.  Patient denies dizziness,  abdominal pain, N/V/D/C, new weakness, numbess, tingling. No acute events overnight.    Objective:   Vitals:   03/26/19 0005 03/26/19 0133 03/26/19 0626 03/26/19 0802  BP: 126/77 103/81 109/66 111/74  Pulse: 98 95 (!) 49 92  Resp: (!) 24 (!) 26  (!) 25  Temp:  (!) 97.5 F (36.4 C) 97.6 F (36.4 C) 97.7 F (36.5 C)   TempSrc:  Oral  Oral  SpO2: 96% 96% 93% 92%  Weight:      Height:  5\' 7"  (1.702 m)      Intake/Output Summary (Last 24 hours) at 03/26/2019 1031 Last data filed at 03/26/2019 1007 Gross per 24 hour  Intake 988.94 ml  Output 1200 ml  Net -211.06 ml     Wt Readings from Last 3 Encounters:  03/25/19 56.7 kg  02/24/19 57.6 kg  01/28/19 57.6 kg     Exam  General: Alert and oriented x 3, NAD, anxious   eyes:   HEENT:  Atraumatic, normocephalic  Cardiovascular: S1 S2 auscultated, tachycardia, irregularly irregular  Respiratory: Clear to auscultation bilaterally, no wheezing, rales or rhonchi  Gastrointestinal: Soft, nontender, nondistended, + bowel sounds  Ext: no pedal edema bilaterally  Neuro: AAOx3, Cr N's II- XII. Strength 5/5 upper and lower extremities bilaterally, speech clear, sensations grossly intact  Musculoskeletal: No digital cyanosis, clubbing  Skin: No rashes  Psych: Anxious, alert and oriented x3    Data Reviewed:  I have personally reviewed following labs and imaging studies  Micro Results Recent Results (from the past 240 hour(s))  SARS Coronavirus 2 (CEPHEID- Performed in [facility] hospital lab), Hosp Order     Status: None   Collection Time: 03/25/19  8:58 PM  Result Value Ref Range Status   SARS Coronavirus 2 NEGATIVE NEGATIVE Final    Comment: (NOTE) If result is NEGATIVE SARS-CoV-2 target nucleic acids are NOT DETECTED. The SARS-CoV-2 RNA is generally detectable in upper and lower  respiratory specimens during the acute phase of infection. The lowest  concentration of SARS-CoV-2 viral copies this assay can detect is 250  copies / mL. A negative result does not preclude SARS-CoV-2 infection  and should not be used as the sole basis for treatment or other  patient management decisions.  A negative result may occur with  improper specimen collection / handling, submission of specimen other  than nasopharyngeal swab, presence of viral  mutation(s) within the  areas targeted by this assay, and inadequate number of viral copies  (<250 copies / mL). A negative result must be combined with clinical  observations, patient history, and epidemiological information. If result is POSITIVE SARS-CoV-2 target nucleic acids are DETECTED. The SARS-CoV-2 RNA is generally detectable in upper and lower  respiratory specimens dur ing the acute phase of infection.  Positive  results are indicative of active infection with SARS-CoV-2.  Clinical  correlation with patient history and other diagnostic information is  necessary to determine patient infection status.  Positive results do  not rule out bacterial infection or co-infection with other viruses. If result is PRESUMPTIVE POSTIVE SARS-CoV-2 nucleic acids MAY BE PRESENT.   A presumptive positive result was obtained on the submitted specimen  and confirmed on repeat testing.  While 2019 novel coronavirus  (SARS-CoV-2) nucleic acids may be present in the submitted sample  additional confirmatory testing may be necessary for epidemiological  and / or clinical management purposes  to differentiate between  SARS-CoV-2 and other Sarbecovirus currently known to infect humans.  If clinically indicated additional testing with an alternate test  methodology [phone]) is advised. The SARS-CoV-2 RNA is generally  detectable in upper and lower respiratory sp ecimens during the acute  phase of infection. The expected result is Negative. Fact Sheet for Patients:  BoilerBrush.com.cy Fact Sheet for Healthcare Providers: https://pope.com/ This test is not yet approved or cleared by the [place] FDA and has been authorized for detection and/or diagnosis of SARS-CoV-2 by FDA under an Emergency Use Authorization (EUA).  This EUA will remain in effect (meaning this test can be used) for the duration of the COVID-19 declaration under Section 564(b)(1)  of the Act, 21 U.S.C. section 360bbb-3(b)(1), unless the authorization is terminated or revoked sooner. Performed at [facility], [address], [place], [place] [address]   Culture, blood (routine x 2)     Status: None (Preliminary result)   Collection Time: 03/25/19  9:22 PM  Result  Value Ref Range Status   Specimen Description BLOOD LEFT ANTECUBITAL  Final   Special Requests   Final    BOTTLES DRAWN AEROBIC AND ANAEROBIC Blood Culture adequate volume   Culture   Final    NO GROWTH < 12 HOURS Performed at [facility], [address] [place], [place] [address]    Report Status PENDING  Incomplete  Culture, blood (routine x 2)     Status: None (Preliminary result)   Collection Time: 03/25/19  9:23 PM  Result Value Ref Range Status   Specimen Description BLOOD BLOOD LEFT WRIST  Final   Special Requests   Final    BOTTLES DRAWN AEROBIC AND ANAEROBIC Blood Culture adequate volume   Culture   Final    NO GROWTH < 12 HOURS Performed at [facility], [address], [place], [place] [address]    Report Status PENDING  Incomplete    Radiology Reports Dg Chest Port 1 View  Result Date: 03/25/2019 CLINICAL DATA:  Shortness of breath. EXAM: PORTABLE CHEST 1 VIEW COMPARISON:  01/06/2019 FINDINGS: The cardiac silhouette is enlarged. There is a right lower lung zone airspace opacity favored to represent a combination of a right-sided pleural effusion and atelectasis/consolidation. No pneumothorax. There is a likely a trace left-sided effusion. A retrocardiac opacity is noted. Generalized volume overload is seen. IMPRESSION: 1. Interval development of a right lower lung zone opacity favored to represent a combination of a small to moderate size right-sided pleural effusion with adjacent atelectasis. A superimposed infectious process cannot be excluded. 2. Trace to small left-sided pleural effusion. New retrocardiac opacity which may represent atelectasis or infiltrate. 3. Mild cardiomegaly. There  is evidence of generalized volume overload. Electronically Signed   By: Katherine Mantle M.D.   On: 03/25/2019 20:55   Dg Hip Unilat With Pelvis 2-3 Views Left  Result Date: 02/24/2019 X-ray report pelvis and left hip 2 views Left hip fracture status post internal fixation Short gamma nail was noted in the left hip and thigh The fracture reduction is maintained hardware is in good position Fracture is healing well. Impression stable fixation left hip   Lab Data:  CBC: Recent Labs  Lab 03/25/19 2033 03/26/19 0544  WBC 9.3 8.7  NEUTROABS 6.2  --   HGB 12.7 10.9*  HCT 43.2 35.4*  MCV 97.7 92.2  PLT 285 237   Basic Metabolic Panel: Recent Labs  Lab 03/25/19 2033  NA 142  K 2.9*  CL 95*  CO2 24  GLUCOSE 230*  BUN 21  CREATININE 1.21*  CALCIUM 8.6*  MG 2.1   GFR: Estimated Creatinine Clearance: 34.9 mL/min (A) (by C-G formula based on SCr of 1.21 mg/dL (H)). Liver Function Tests: No results for input(s): AST, ALT, ALKPHOS, BILITOT, PROT, ALBUMIN in the last 168 hours. No results for input(s): LIPASE, AMYLASE in the last 168 hours. No results for input(s): AMMONIA in the last 168 hours. Coagulation Profile: No results for input(s): INR, PROTIME in the last 168 hours. Cardiac Enzymes: Recent Labs  Lab 03/25/19 2033 03/25/19 2303  TROPONINI 0.14* 0.11*   BNP (last 3 results) No results for input(s): PROBNP in the last 8760 hours. HbA1C: Recent Labs    03/26/19 0224  HGBA1C 5.4   CBG: No results for input(s): GLUCAP in the last 168 hours. Lipid Profile: Recent Labs    03/26/19 0224  CHOL 89  HDL 33*  LDLCALC 43  TRIG 65  CHOLHDL 2.7   Thyroid Function Tests: Recent Labs  03/25/19 2033  TSH 2.216   Anemia Panel: No results for input(s): VITAMINB12, FOLATE, FERRITIN, TIBC, IRON, RETICCTPCT in the last 72 hours. Urine analysis:    Component Value Date/Time   COLORURINE YELLOW 01/14/2019 0226   APPEARANCEUR CLEAR 01/14/2019 0226   LABSPEC 1.012  01/14/2019 0226   PHURINE 7.0 01/14/2019 0226   GLUCOSEU NEGATIVE 01/14/2019 0226   HGBUR NEGATIVE 01/14/2019 0226   BILIRUBINUR NEGATIVE 01/14/2019 0226   KETONESUR NEGATIVE 01/14/2019 0226   PROTEINUR NEGATIVE 01/14/2019 0226   NITRITE NEGATIVE 01/14/2019 0226   LEUKOCYTESUR NEGATIVE 01/14/2019 0226     Chanice Brenton M.D. [facility] 03/26/2019, 10:31 AM  Pager: [phone] Between 7am to 7pm - call Pager - [phone]  After 7pm go to [email or web address] - password [email or web address]  Call night coverage person covering after 7pm

## 2019-03-26 NOTE — Plan of Care (Signed)
  Problem: Cardiovascular: Goal: Ability to achieve and maintain adequate cardiovascular perfusion will improve Outcome: Progressing Goal: Vascular access site(s) Level 0-1 will be maintained Outcome: Progressing   Problem: Health Behavior/Discharge Planning: Goal: Ability to safely manage health-related needs after discharge will improve Outcome: Progressing   

## 2019-03-26 NOTE — Progress Notes (Addendum)
ANTIBIOTIC CONSULT NOTE-Preliminary  Pharmacy Consult for vancomycin & cefepime Indication: pneumonia  Allergies  Allergen Reactions  . Codeine Nausea And Vomiting  . Tramadol Nausea And Vomiting  . Asa [Aspirin] Other (See Comments)    Patient is unaware of allergy  . Azithromycin Other (See Comments)    Patient is unaware of allergy  . Celebrex [Celecoxib] Other (See Comments)    Irritated stomach   . Cymbalta [Duloxetine Hcl] Other (See Comments)    Felt groggy  . Prednisone Rash    She has seen the podiatrist and he had injected cortisone in her feet and she had also taken a peel at home. She had a severe reaction to her face with a rash and had to take Benadryl to resolve the symptom. She actually did get more cortisone shots, but no additional reactions to the shots.  . Vioxx [Rofecoxib] Other (See Comments)    Unknown  . Zelnorm [Tegaserod] Other (See Comments)    Patient is unaware of allergy  . Zocor [Simvastatin] Other (See Comments)    Patient is unaware of allergy    Patient Measurements: Height: 5\' 7"  (170.2 cm) Weight: 125 lb (56.7 kg) IBW/kg (Calculated) : 61.6 Adjusted Body Weight:   Vital Signs: Temp: 98.2 F (36.8 C) (05/07 2222) Temp Source: Rectal (05/07 2222) BP: 126/77 (05/08 0005) Pulse Rate: 98 (05/08 0005)  Labs: Recent Labs    03/25/19 2033  WBC 9.3  HGB 12.7  PLT 285  CREATININE 1.21*    Estimated Creatinine Clearance: 34.9 mL/min (A) (by C-G formula based on SCr of 1.21 mg/dL (H)).  No results for input(s): VANCOTROUGH, VANCOPEAK, VANCORANDOM, GENTTROUGH, GENTPEAK, GENTRANDOM, TOBRATROUGH, TOBRAPEAK, TOBRARND, AMIKACINPEAK, AMIKACINTROU, AMIKACIN in the last 72 hours.   Microbiology: Recent Results (from the past 720 hour(s))  SARS Coronavirus 2 (CEPHEID- Performed in [facility] hospital lab), Hosp Order     Status: None   Collection Time: 03/25/19  8:58 PM  Result Value Ref Range Status   SARS Coronavirus 2 NEGATIVE NEGATIVE  Final    Comment: (NOTE) If result is NEGATIVE SARS-CoV-2 target nucleic acids are NOT DETECTED. The SARS-CoV-2 RNA is generally detectable in upper and lower  respiratory specimens during the acute phase of infection. The lowest  concentration of SARS-CoV-2 viral copies this assay can detect is 250  copies / mL. A negative result does not preclude SARS-CoV-2 infection  and should not be used as the sole basis for treatment or other  patient management decisions.  A negative result may occur with  improper specimen collection / handling, submission of specimen other  than nasopharyngeal swab, presence of viral mutation(s) within the  areas targeted by this assay, and inadequate number of viral copies  (<250 copies / mL). A negative result must be combined with clinical  observations, patient history, and epidemiological information. If result is POSITIVE SARS-CoV-2 target nucleic acids are DETECTED. The SARS-CoV-2 RNA is generally detectable in upper and lower  respiratory specimens dur ing the acute phase of infection.  Positive  results are indicative of active infection with SARS-CoV-2.  Clinical  correlation with patient history and other diagnostic information is  necessary to determine patient infection status.  Positive results do  not rule out bacterial infection or co-infection with other viruses. If result is PRESUMPTIVE POSTIVE SARS-CoV-2 nucleic acids MAY BE PRESENT.   A presumptive positive result was obtained on the submitted specimen  and confirmed on repeat testing.  While 2019 novel coronavirus  (SARS-CoV-2) nucleic acids  may be present in the submitted sample  additional confirmatory testing may be necessary for epidemiological  and / or clinical management purposes  to differentiate between  SARS-CoV-2 and other Sarbecovirus currently known to infect humans.  If clinically indicated additional testing with an alternate test  methodology [phone]) is advised. The  SARS-CoV-2 RNA is generally  detectable in upper and lower respiratory sp ecimens during the acute  phase of infection. The expected result is Negative. Fact Sheet for Patients:  BoilerBrush.com.cy Fact Sheet for Healthcare Providers: https://pope.com/ This test is not yet approved or cleared by the [place] FDA and has been authorized for detection and/or diagnosis of SARS-CoV-2 by FDA under an Emergency Use Authorization (EUA).  This EUA will remain in effect (meaning this test can be used) for the duration of the COVID-19 declaration under Section 564(b)(1) of the Act, 21 U.S.C. section 360bbb-3(b)(1), unless the authorization is terminated or revoked sooner. Performed at [facility], [address] [place], [place] [address]     Medical History: Past Medical History:  Diagnosis Date  . Anxiety    takes Xanax daily  . Arthritis    back  . Cataract   . Chronic back pain   . Constipation    OTC stool softener prn  . COPD (chronic obstructive pulmonary disease) (HCC)   . Depression   . Diverticulosis   . GERD (gastroesophageal reflux disease)    takes Ranidine daily  . Hemorrhoids   . History of bronchitis   . History of migraine    many yrs ago  . History of shingles   . Hyperlipidemia   . Insomnia   . Migraines   . Nocturia   . Numbness    left leg  . Osteoporosis   . PONV (postoperative nausea and vomiting)   . Urinary frequency   . Urinary urgency     Medications:  Infusions:  . sodium chloride    . ceFEPime (MAXIPIME) IV    . diltiazem (CARDIZEM) infusion 15 mg/hr (03/25/19 2328)  . heparin 900 Units/hr (03/25/19 2331)  . potassium chloride 10 mEq (03/25/19 2337)  . vancomycin     Anti-infectives (From admission, onward)   Start     Dose/Rate Route Frequency Ordered Stop   03/26/19 0115  vancomycin (VANCOCIN) IVPB 1000 mg/200 mL premix     1,000 mg 200 mL/hr over 60 Minutes Intravenous  Once  03/26/19 0101     03/26/19 0115  ceFEPIme (MAXIPIME) 2 g in sodium chloride 0.9 % 100 mL IVPB     2 g 200 mL/hr over 30 Minutes Intravenous  Once 03/26/19 0101     03/25/19 2230  cefTRIAXone (ROCEPHIN) 1 g in sodium chloride 0.9 % 100 mL IVPB     1 g 200 mL/hr over 30 Minutes Intravenous  Once 03/25/19 2225 03/25/19 2318   03/25/19 2230  doxycycline (VIBRA-TABS) tablet 100 mg     100 mg Oral  Once 03/25/19 2225 03/25/19 2234      Assessment: 78 yo w/ COPD, admitted with dypnea ddx atrial fibrillation with RVR.  Pharmacy has been asked to start vancomycin and cefepime  Plan:  Preliminary review of pertinent patient information completed.  Protocol will be initiated with dose(s) of vancomycin 1 gram, cefepime 2g.  [name] clinical pharmacist will complete review during morning rounds to assess patient and finalize treatment regimen if needed.  Loyola Mast, RPH 03/26/2019,1:04 AM

## 2019-03-26 NOTE — Telephone Encounter (Signed)
New Message    Patient has a TOC appt scheduled 05/20 with Dr. Percival Spanish.

## 2019-03-26 NOTE — Progress Notes (Addendum)
Virtual Visit via telephone Note Due to COVID-19, visit is conducted virtually and was requested by patient. This visit type was conducted due to national recommendations for restrictions regarding the COVID-19 Pandemic (e.g. social distancing) in an effort to limit this patient's exposure and mitigate transmission in our community. All issues noted in this document were discussed and addressed.  A physical exam was not performed with this format.   I connected with Misty [name] on 03/26/19 at 1420 by telephone and verified that I am speaking with the correct person using two identifiers. Misty [name] is currently located at home and family is currently with them during visit. The provider, Kari Baars, FNP is located in their office at time of visit.  I discussed the limitations, risks, security and privacy concerns of performing an evaluation and management service by telephone and the availability of in person appointments. I also discussed with the patient that there may be a patient responsible charge related to this service. The patient expressed understanding and agreed to proceed.  Subjective:  Patient ID: Misty [name], female    DOB: 1941/02/26, 78 y.o.   MRN: [id number]  Chief Complaint:  Fatigue; Shortness of Breath; and Leg Swelling   HPI: Misty [name] is a 78 y.o. female presenting on 03/25/2019 for Fatigue; Shortness of Breath; and Leg Swelling   Pt and daughter report increasing fatigue and weakness, shortness of breath, and leg swelling. These symptoms have been ongoing and worsening since her hip surgery in February. States she has exertional shortness of breath, decreased appetite, insomnia, and extreme weakness. Denies fever, chills, confusion, dizziness, palpitations or chest pain. States she is just not getting better. She has been treated for lower extremity cellulitis, states the redness is improving but the swelling is not going away.     Relevant past  medical, surgical, family, and social history reviewed and updated as indicated.  Allergies and medications reviewed and updated.   Past Medical History:  Diagnosis Date  . Anxiety    takes Xanax daily  . Arthritis    back  . Cataract   . Chronic back pain   . Constipation    OTC stool softener prn  . COPD (chronic obstructive pulmonary disease) (HCC)   . Depression   . Diverticulosis   . GERD (gastroesophageal reflux disease)    takes Ranidine daily  . Hemorrhoids   . History of bronchitis   . History of migraine    many yrs ago  . History of shingles   . Hyperlipidemia   . Insomnia   . Migraines   . Nocturia   . Numbness    left leg  . Osteoporosis   . PONV (postoperative nausea and vomiting)   . Urinary frequency   . Urinary urgency     Past Surgical History:  Procedure Laterality Date  . ABDOMINAL HYSTERECTOMY    . COLONOSCOPY    . COLONOSCOPY N/A 02/17/2015   Procedure: COLONOSCOPY;  Surgeon: Malissa Hippo, MD;  Location: [facility];  Service: Endoscopy;  Laterality: N/A;  110n - moved to 11:15 - Ann to notify pt  . ESOPHAGOGASTRODUODENOSCOPY    . ESOPHAGOGASTRODUODENOSCOPY N/A 01/29/2016   Procedure: ESOPHAGOGASTRODUODENOSCOPY (EGD);  Surgeon: Malissa Hippo, MD;  Location: [facility];  Service: Endoscopy;  Laterality: N/A;  . ESOPHAGOGASTRODUODENOSCOPY N/A 10/14/2017   Procedure: ESOPHAGOGASTRODUODENOSCOPY (EGD);  Surgeon: Malissa Hippo, MD;  Location: [facility];  Service: Endoscopy;  Laterality: N/A;  730  .  EYE SURGERY Right 3/15   cataracts  . INTRAMEDULLARY (IM) NAIL INTERTROCHANTERIC Left 01/07/2019   Procedure: INTRAMEDULLARY (IM) NAIL INTERTROCHANTRIC;  Surgeon: Vickki Hearing, MD;  Location: [facility];  Service: Orthopedics;  Laterality: Left;  . KYPHOPLASTY N/A 07/08/2014   Procedure: Thoracic Eleven Kyphoplasty;  Surgeon: Hewitt Shorts, MD;  Location: [facility];  Service: Neurosurgery;  Laterality: N/A;  . LUMBAR  LAMINECTOMY/DECOMPRESSION MICRODISCECTOMY Bilateral 05/20/2013   Procedure: LUMBAR LAMINECTOMY/DECOMPRESSION MICRODISCECTOMY 1 LEVEL;  Surgeon: Hewitt Shorts, MD;  Location: [facility];  Service: Neurosurgery;  Laterality: Bilateral;  Bilateral Lumbar four-five laminotomy and left lumbar four-five microdiskectomy  . SPINE SURGERY     Dr Channing Mutters -  vertebraplasty    Social History   Socioeconomic History  . Marital status: Married    Spouse name: Velda Shell   . Number of children: Not on file  . Years of education: Not on file  . Highest education level: Not on file  Occupational History  . 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 Financial resource strain: Not on file  . Food insecurity:    Worry: Not on file    Inability: Not on file  . Transportation needs:    Medical: Not on file    Non-medical: Not on file  Tobacco Use  . Smoking status: Current Some Day Smoker    Packs/day: 1.00    Years: 52.00    Pack years: 52.00    Types: Cigarettes    Start date: 11/18/1958    Last attempt to quit: 08/20/2011    Years since quitting: 7.6  . Smokeless tobacco: Never Used  . Tobacco comment: 1/2-1pack off and on all her life  Substance and Sexual Activity  . Alcohol use: No    Alcohol/week: 0.0 standard drinks  . Drug use: No  . Sexual activity: Not Currently  Lifestyle  . Physical activity:    Days per week: Not on file    Minutes per session: Not on file  . Stress: Not on file  Relationships  . Social connections:    Talks on phone: Not on file    Gets together: Not on file    Attends religious service: Not on file    Active member of club or organization: Not on file    Attends meetings of clubs or organizations: Not on file    Relationship status: Not on file  . Intimate partner violence:    Fear of current or ex partner: Not on file    Emotionally abused: Not on file    Physically abused: Not on file    Forced sexual activity: Not  on file  Other Topics Concern  . Not on file  Social History Narrative   Lives at home with husband, Velda Shell.   Had one son- now deceased     No facility-administered encounter medications on file as of 03/25/2019.    Outpatient Encounter Medications as of 03/25/2019  Medication Sig  . acetaminophen (TYLENOL) 500 MG tablet Take 500 mg by mouth every 8 (eight) hours.   Marland Kitchen albuterol (PROVENTIL HFA;VENTOLIN HFA) 108 (90 Base) MCG/ACT inhaler Inhale 2 puffs into the lungs every 6 (six) hours as needed for wheezing or shortness of breath (cough).  Marland Kitchen aspirin EC 325 MG tablet Take 1 tablet (325 mg total) by mouth daily.  Lucilla Lame [place]-Castor Oil (VENELEX) OINT Apply topically to Sacrum and bilateral buttocks every  shift and as needed for prevention  . cholecalciferol (VITAMIN D) 1000 UNITS tablet Take 2,000-4,000 Units by mouth daily. Take 2000 units daily except take 4000 units on Saturday and Sunday.  . docusate sodium (COLACE) 100 MG capsule Take 2 capsules (200 mg total) by mouth 2 (two) times daily.  Marland Kitchen escitalopram (LEXAPRO) 20 MG tablet Take 1 tablet (20 mg total) by mouth at bedtime.  . FeFum-FePoly-FA-B Cmp-C-Biot (INTEGRA PLUS) CAPS Take 1 capsule by mouth daily.  . ferrous sulfate (FEOSOL) 325 (65 FE) MG tablet Take 1 tablet (325 mg total) by mouth 3 (three) times daily with meals.  . Lidocaine 4 % PTCH Apply 1 patch to left hip and 2nd patch to lumbar back and remove at HS for pain management  . magic mouthwash w/lidocaine SOLN Take 5 mLs by mouth 3 (three) times daily as needed for mouth pain.  . Multiple Vitamin (MULTIVITAMIN WITH MINERALS) TABS tablet Take 1 tablet by mouth daily. Centrum Silver  . NON FORMULARY Diet Type: NAS  . Nutritional Supplements (ENSURE ENLIVE PO) Take 1 Bottle by mouth 2 (two) times daily between meals.  . nystatin-triamcinolone ointment (MYCOLOG) Apply 1 application topically 2 (two) times daily.  . pantoprazole (PROTONIX) 40 MG tablet Take 1 tablet (40 mg  total) by mouth 2 (two) times daily before a meal.  . vitamin B-12 (CYANOCOBALAMIN) 1000 MCG tablet Take 1,000 mcg by mouth daily.    Allergies  Allergen Reactions  . Codeine Nausea And Vomiting  . Tramadol Nausea And Vomiting  . Asa [Aspirin] Other (See Comments)    Patient is unaware of allergy  . Azithromycin Other (See Comments)    Patient is unaware of allergy  . Celebrex [Celecoxib] Other (See Comments)    Irritated stomach   . Cymbalta [Duloxetine Hcl] Other (See Comments)    Felt groggy  . Prednisone Rash    She has seen the podiatrist and he had injected cortisone in her feet and she had also taken a peel at home. She had a severe reaction to her face with a rash and had to take Benadryl to resolve the symptom. She actually did get more cortisone shots, but no additional reactions to the shots.  . Vioxx [Rofecoxib] Other (See Comments)    Unknown  . Zelnorm [Tegaserod] Other (See Comments)    Patient is unaware of allergy  . Zocor [Simvastatin] Other (See Comments)    Patient is unaware of allergy    Review of Systems  Constitutional: Positive for activity change, appetite change and fatigue. Negative for chills, fever and unexpected weight change.  Respiratory: Negative for cough, chest tightness and shortness of breath.   Cardiovascular: Positive for leg swelling. Negative for chest pain and palpitations.  Gastrointestinal: Positive for diarrhea and nausea. Negative for constipation and vomiting.  Genitourinary: Negative for decreased urine volume and difficulty urinating.  Musculoskeletal: Positive for arthralgias.  Neurological: Positive for weakness. Negative for dizziness.  Psychiatric/Behavioral: Negative for confusion.  All other systems reviewed and are negative.        Observations/Objective: No vital signs or physical exam, this was a telephone or virtual health encounter.  Pt alert and oriented, answers all questions appropriately, and able to speak in  full sentences.    Assessment and Plan: Coren was seen today for fatigue, shortness of breath and leg swelling.  Diagnoses and all orders for this visit:  Shortness of breath Leg swelling Weakness  Due to progression of reported symptoms, pt was advised to  go to the ED for evaluation and treatment. Differentials include, but are not limited to: DVT, PE, dehydration, CHF, sepsis, ongoing cellulitis.    Follow Up Instructions: Return in about 1 week (around 04/01/2019), or if symptoms worsen or fail to improve.    I discussed the assessment and treatment plan with the patient. The patient was provided an opportunity to ask questions and all were answered. The patient agreed with the plan and demonstrated an understanding of the instructions.   The patient was advised to call back or seek an in-person evaluation if the symptoms worsen or if the condition fails to improve as anticipated.  The above assessment and management plan was discussed with the patient. The patient verbalized understanding of and has agreed to the management plan. Patient is aware to call the clinic if symptoms persist or worsen. Patient is aware when to return to the clinic for a follow-up visit. Patient educated on when it is appropriate to go to the emergency department.    I provided 15 minutes of non-face-to-face time during this encounter. The call started at 1420. The call ended at 1435. The other time was used for coordination of care.    Kari Baars, FNP-C [facility] [address] [place], [place] [address] [phone]

## 2019-03-26 NOTE — Telephone Encounter (Signed)
Patient currently admitted.  Attempt 05/11 for TOC call.

## 2019-03-26 NOTE — Progress Notes (Signed)
  Echocardiogram 2D Echocardiogram has been performed.  Misty [name] 03/26/2019, 9:34 AM

## 2019-03-26 NOTE — Evaluation (Signed)
Physical Therapy Evaluation Patient Details Name: Misty [name] MRN: [id number] DOB: [birth year] Today's Date: 03/26/2019   History of Present Illness  78 y.o. female admitted with dyspnea for 1 weeks with PNA and aFib. PMHx: COPD, nicotine dep,  GERD, Hyperlipidemia, Migraines, Anxiety, left hip ORIF 01/07/19, colitis  Clinical Impression  Pt pleasant but reports significant fatigue from anxiety, lack of sleep and SOB. Pt in chair on arrival and able to provide PLOF and states she was moving well at home after hip fx until a week ago with acute weakness/fatigue. Pt with excessive coughing fit prior to standing with HR up to 140 with sats >92% on 2L with noted sputum with brown chunks on expectoration. Pt with decreased strength, mobility and gait who will benefit from acute therapy to maximize mobility, function, safety and independence to decrease burden of care.      Follow Up Recommendations Home health PT;Supervision/Assistance - 24 hour    Equipment Recommendations  None recommended by PT    Recommendations for Other Services OT consult     Precautions / Restrictions Precautions Precautions: Fall      Mobility  Bed Mobility               General bed mobility comments: pt in chair on arrival  Transfers Overall transfer level: Needs assistance   Transfers: Sit to/from Stand Sit to Stand: Min assist         General transfer comment: min assist to rise from surface with cues for hand placement x 2 trials  Ambulation/Gait Ambulation/Gait assistance: Min assist Gait Distance (Feet): 30 Feet Assistive device: Rolling walker (2 wheeled) Gait Pattern/deviations: Step-through pattern;Decreased stride length;Trunk flexed   Gait velocity interpretation: 1.31 - 2.62 ft/sec, indicative of limited community ambulator General Gait Details: cues for posture, position in RW and assist to avoid environmental obstacles. Pt rushing to sit at the end of gait stating fatigue and  incontinent of BM  Stairs            Wheelchair Mobility    Modified Rankin (Stroke Patients Only)       Balance Overall balance assessment: Needs assistance   Sitting balance-Leahy Scale: Fair Sitting balance - Comments: able to sit without UE support   Standing balance support: Bilateral upper extremity supported Standing balance-Leahy Scale: Poor Standing balance comment: bil UE support on RW for gait                             Pertinent Vitals/Pain Pain Assessment: 0-10 Pain Score: 4  Pain Location: back Pain Descriptors / Indicators: Aching;Discomfort Pain Intervention(s): Limited activity within patient's tolerance;Monitored during session;Repositioned    Home Living Family/patient expects to be discharged to:: Private residence Living Arrangements: Spouse/significant other Available Help at Discharge: Family;Available 24 hours/day Type of Home: House Home Access: Ramped entrance     Home Layout: One level Home Equipment: Cane - single point;Walker - 2 wheels;Shower seat;Wheelchair - manual;Grab bars - tub/shower;Grab bars - toilet      Prior Function Level of Independence: Needs assistance   Gait / Transfers Assistance Needed: walks about 75' with RW at home, walks very short distances without RW  ADL's / Homemaking Assistance Needed: sits down to sponge bathe with spouse assist for back and for lower body dressing at times. PT does some cooking        Hand Dominance        Extremity/Trunk Assessment   Upper Extremity  Assessment Upper Extremity Assessment: Generalized weakness    Lower Extremity Assessment Lower Extremity Assessment: Generalized weakness    Cervical / Trunk Assessment Cervical / Trunk Assessment: Kyphotic  Communication   Communication: No difficulties  Cognition Arousal/Alertness: Awake/alert Behavior During Therapy: Anxious Overall Cognitive Status: Within Functional Limits for tasks assessed                                  General Comments: pt with baseline anxiety, xanax 3x/day      General Comments      Exercises     Assessment/Plan    PT Assessment Patient needs continued PT services  PT Problem List Decreased mobility;Decreased strength;Decreased balance;Decreased cognition;Decreased activity tolerance;Cardiopulmonary status limiting activity;Decreased safety awareness       PT Treatment Interventions DME instruction;Functional mobility training;Balance training;Patient/family education;Gait training;Therapeutic activities;Stair training;Therapeutic exercise    PT Goals (Current goals can be found in the Care Plan section)  Acute Rehab PT Goals Patient Stated Goal: watch stories and racing, play with grandkids PT Goal Formulation: With patient Time For Goal Achievement: 04/09/19 Potential to Achieve Goals: Fair    Frequency Min 3X/week   Barriers to discharge Decreased caregiver support pt reports 82yo spouse in good healt to assist at home    Co-evaluation               AM-PAC PT "6 Clicks" Mobility  Outcome Measure Help needed turning from your back to your side while in a flat bed without using bedrails?: A Little Help needed moving from lying on your back to sitting on the side of a flat bed without using bedrails?: A Little Help needed moving to and from a bed to a chair (including a wheelchair)?: A Little Help needed standing up from a chair using your arms (e.g., wheelchair or bedside chair)?: A Little Help needed to walk in hospital room?: A Little Help needed climbing 3-5 steps with a railing? : A Lot 6 Click Score: 17    End of Session Equipment Utilized During Treatment: Gait belt;Oxygen Activity Tolerance: Patient limited by fatigue Patient left: in chair;with call bell/phone within reach;Other (comment)(with NT for bathing) Nurse Communication: Mobility status PT Visit Diagnosis: Other abnormalities of gait and mobility  (R26.89);Muscle weakness (generalized) (M62.81);History of falling (Z91.81);Difficulty in walking, not elsewhere classified (R26.2)    Time: 5409-8119 PT Time Calculation (min) (ACUTE ONLY): 33 min   Charges:   PT Evaluation $PT Eval Moderate Complexity: 1 Mod PT Treatments $Gait Training: 8-22 mins        Jenilyn Magana Abner Greenspan, PT Acute Rehabilitation Services Pager: [phone] Office: [phone]   Marysue Fait B Zoejane Gaulin 03/26/2019, 1:45 PM

## 2019-03-26 NOTE — Progress Notes (Signed)
Initial Nutrition Assessment  INTERVENTION:   Continue Ensure Enlive po BID, each supplement provides 350 kcal and 20 grams of protein  NUTRITION DIAGNOSIS:   Increased nutrient needs related to chronic illness(COPD) as evidenced by estimated needs.  GOAL:   Patient will meet greater than or equal to 90% of their needs  MONITOR:   PO intake, Supplement acceptance, Labs, Weight trends, I & O's  REASON FOR ASSESSMENT:   Malnutrition Screening Tool    ASSESSMENT:   78 year old female with COPD, nicotine dependence, hyperlipidemia, GERD, anxiety presented with dyspnea for past 1 week. Admitted for Atrial fibrillation.   **RD working remotely**  Per chart review, pt with 0% PO intakes since admission. Ensure supplements have been ordered and pt is drinking. Will continue given increased needs from COPD. PTA pt has had decreased appetite related to ongoing SOB, fatigue and leg swelling, since her hip surgery in February 2020.  Per weight records, pt weighed 124 lb in June 2019 which is consistent with current weight. Pt had a trend upward over the past year but now weight is back to usual weight from 1 year ago. Given reports of LE swelling, suspect this trend in weights was related to fluid.  Labs reviewed. Medications: Ferrous fumarate -B12-Vitamin C-Folic acid capsule daily, Multivitamin with minerals daily, Vitamin B-12 tablet daily, KCl infusion  NUTRITION - FOCUSED PHYSICAL EXAM:  Unable to perform per department requirements to work remotely.   Diet Order:   Diet Order            Diet heart healthy/carb modified Room service appropriate? Yes; Fluid consistency: Thin  Diet effective now              EDUCATION NEEDS:   No education needs have been identified at this time  Skin:  Skin Assessment: Reviewed RN Assessment  Last BM:  5/7  Height:   Ht Readings from Last 1 Encounters:  03/26/19 5\' 7"  (1.702 m)    Weight:   Wt Readings from Last 1 Encounters:   03/25/19 56.7 kg    Ideal Body Weight:  61.3 kg  BMI:  Body mass index is 19.58 kg/m.  Estimated Nutritional Needs:   Kcal:  1450-1650  Protein:  65-75g  Fluid:  1.6L/day  Tilda Franco, MS, RD, LDN [facility] Clinical Dietitian Pager: [phone] After Hours Pager: [phone]

## 2019-03-26 NOTE — Progress Notes (Signed)
ANTICOAGULATION CONSULT NOTE   Pharmacy Consult for Heparin  Indication: atrial fibrillation  Allergies  Allergen Reactions  . Codeine Nausea And Vomiting  . Tramadol Nausea And Vomiting  . Asa [Aspirin] Other (See Comments)    Patient is unaware of allergy  . Azithromycin Other (See Comments)    Patient is unaware of allergy  . Celebrex [Celecoxib] Other (See Comments)    Irritated stomach   . Cymbalta [Duloxetine Hcl] Other (See Comments)    Felt groggy  . Prednisone Rash    She has seen the podiatrist and he had injected cortisone in her feet and she had also taken a peel at home. She had a severe reaction to her face with a rash and had to take Benadryl to resolve the symptom. She actually did get more cortisone shots, but no additional reactions to the shots.  . Vioxx [Rofecoxib] Other (See Comments)    Unknown  . Zelnorm [Tegaserod] Other (See Comments)    Patient is unaware of allergy  . Zocor [Simvastatin] Other (See Comments)    Patient is unaware of allergy    Patient Measurements: Height: 5\' 7"  (170.2 cm) Weight: 125 lb (56.7 kg) IBW/kg (Calculated) : 61.6 Heparin Dosing Weight: HEPARIN DW (KG): 56.7   Vital Signs: Temp: 97.6 F (36.4 C) (05/08 0626) Temp Source: Oral (05/08 0133) BP: 109/66 (05/08 0626) Pulse Rate: 49 (05/08 0626)  Labs: Recent Labs    03/25/19 2033 03/25/19 2303 03/26/19 0544  HGB 12.7  --  10.9*  HCT 43.2  --  35.4*  PLT 285  --  237  HEPARINUNFRC  --   --  0.32  CREATININE 1.21*  --   --   TROPONINI 0.14* 0.11*  --     Estimated Creatinine Clearance: 34.9 mL/min (A) (by C-G formula based on SCr of 1.21 mg/dL (H)).   Medical History: Past Medical History:  Diagnosis Date  . Anxiety    takes Xanax daily  . Arthritis    back  . Cataract   . Chronic back pain   . Constipation    OTC stool softener prn  . COPD (chronic obstructive pulmonary disease) (HCC)   . Depression   . Diverticulosis   . GERD (gastroesophageal  reflux disease)    takes Ranidine daily  . Hemorrhoids   . History of bronchitis   . History of migraine    many yrs ago  . History of shingles   . Hyperlipidemia   . Insomnia   . Migraines   . Nocturia   . Numbness    left leg  . Osteoporosis   . PONV (postoperative nausea and vomiting)   . Urinary frequency   . Urinary urgency    Infusions:  . sodium chloride    . ceFEPime (MAXIPIME) IV    . diltiazem (CARDIZEM) infusion 15 mg/hr (03/26/19 0411)  . heparin 900 Units/hr (03/26/19 0028)  . vancomycin     PRN:  Anti-infectives (From admission, onward)   Start     Dose/Rate Route Frequency Ordered Stop   03/26/19 0115  vancomycin (VANCOCIN) IVPB 1000 mg/200 mL premix     1,000 mg 200 mL/hr over 60 Minutes Intravenous  Once 03/26/19 0101     03/26/19 0115  ceFEPIme (MAXIPIME) 2 g in sodium chloride 0.9 % 100 mL IVPB     2 g 200 mL/hr over 30 Minutes Intravenous  Once 03/26/19 0101     03/25/19 2230  cefTRIAXone (ROCEPHIN) 1 g in sodium  chloride 0.9 % 100 mL IVPB     1 g 200 mL/hr over 30 Minutes Intravenous  Once 03/25/19 2225 03/25/19 2318   03/25/19 2230  doxycycline (VIBRA-TABS) tablet 100 mg     100 mg Oral  Once 03/25/19 2225 03/25/19 2234      Assessment: Misty [name] a 78 y.o. female requires anticoagulation with a heparin iv infusion for the indication of  atrial fibrillation. Heparin gtt will be started following pharmacy protocol per pharmacy consult. Patient is not on previous oral anticoagulant that will require aPTT/HL correlation before transitioning to only HL monitoring.   5/8 AM update: initial heparin level therapeutic    Goal of Therapy:  Heparin level 0.3-0.7 units/ml Monitor platelets by anticoagulation protocol: Yes   Plan:  Cont heparin at 900 units/hr Confirmatory heparin level at 1200  Abran Duke, PharmD, BCPS Clinical Pharmacist Phone: [phone]

## 2019-03-26 NOTE — Consult Note (Signed)
Cardiology Consultation:   Patient ID: Misty [name] MRN: [id number]; DOB: Apr 05, 1941  Admit date: 03/25/2019 Date of Consult: 03/26/2019  Primary Care Provider: Chipper Herb, MD Primary Cardiologist: New to [place]  Primary Electrophysiologist:  None    Patient Profile:   Misty [name] is a 78 y.o. female with a hx of COPD, + tob,  HLD and chronic back pain who is being seen today for the evaluation of atrial fib at the request of Dr. Tana Coast.  History of Present Illness:   Misty [name] presented to ER yesterday by EMS due to SOB.  The SOB began this week with slight cough.  Maybe palpitations.  Pt without chills, fever, or chest pain. (Feb 2020 with hip fx Lt femur with fall and had Lt hip pinning)  In discussion with pt she tells me she has chest tightness and has had with the SOB.  She has not been able to sleep due to the SOB, could not lie flat.   She does have a hx of iron def. Anemia.  She has noticed since hip surgery she has had some lower ext edema that did not clear.  No prior cardiac hx.   She was found to be in a fib with RVR at 140.  Placed on dilt drip and IV heparin.   CHAD2S2VASc of 3.  Also with HCAP and ABX added.    EKG:  The EKG was personally reviewed and demonstrates:  A fib though could be flutter with HR 139 and poor R wave progression, ? old Ant MI. Follow up with HR in 120s and a flutter   Telemetry:  Telemetry was personally reviewed and demonstrates:  A flutter.  Troponin 0.14 and 0.11  BNP > 4500 Na 142, K+ 2.9, glucose 230 Cr 1.21, anion gap > 20 Mag 2.1  Total chol 89 HDL 33 LDL 43 Lactic Acid 5.8 then down to 2.8 WBC 8.7, Hgb 10.9  plts 237 DDimer 1.69 Hgb A1c 5.4 TSH 2.216 COVID panel neg Blood cultures pending PCXR :  IMPRESSION: 1. Interval development of a right lower lung zone opacity favored to represent a combination of a small to moderate size right-sided pleural effusion with adjacent atelectasis. A superimposed infectious process  cannot be excluded. 2. Trace to small left-sided pleural effusion. New retrocardiac opacity which may represent atelectasis or infiltrate. 3. Mild cardiomegaly. There is evidence of generalized volume overload.  Echo Pending   Currently BP stable 111/74 P in the 90s a flutter and afebrile. On dilt drip and heparin. Not feeling much better, due to no rest for a week.     Past Medical History:  Diagnosis Date  . Anxiety    takes Xanax daily  . Arthritis    back  . Cataract   . Chronic back pain   . Constipation    OTC stool softener prn  . COPD (chronic obstructive pulmonary disease) ([place])   . Depression   . Diverticulosis   . GERD (gastroesophageal reflux disease)    takes Ranidine daily  . Hemorrhoids   . History of bronchitis   . History of migraine    many yrs ago  . History of shingles   . Hyperlipidemia   . Insomnia   . Migraines   . Nocturia   . Numbness    left leg  . Osteoporosis   . PONV (postoperative nausea and vomiting)   . Urinary frequency   . Urinary urgency     Past Surgical  History:  Procedure Laterality Date  . ABDOMINAL HYSTERECTOMY    . COLONOSCOPY    . COLONOSCOPY N/A 02/17/2015   Procedure: COLONOSCOPY;  Surgeon: Rogene Houston, MD;  [facility];  Service: Endoscopy;  Laterality: N/A;  110n - moved to 11:15 - Ann to notify pt  . ESOPHAGOGASTRODUODENOSCOPY    . ESOPHAGOGASTRODUODENOSCOPY N/A 01/29/2016   Procedure: ESOPHAGOGASTRODUODENOSCOPY (EGD);  Surgeon: Rogene Houston, MD;  [facility];  Service: Endoscopy;  Laterality: N/A;  . ESOPHAGOGASTRODUODENOSCOPY N/A 10/14/2017   Procedure: ESOPHAGOGASTRODUODENOSCOPY (EGD);  Surgeon: Rogene Houston, MD;  [facility];  Service: Endoscopy;  Laterality: N/A;  730  . EYE SURGERY Right 3/15   cataracts  . INTRAMEDULLARY (IM) NAIL INTERTROCHANTERIC Left 01/07/2019   Procedure: INTRAMEDULLARY (IM) NAIL INTERTROCHANTRIC;  Surgeon: Carole Civil, MD;   Location: [facility];  Service: Orthopedics;  Laterality: Left;  . KYPHOPLASTY N/A 07/08/2014   Procedure: Thoracic Eleven Kyphoplasty;  Surgeon: Hosie Spangle, MD;  Location: [place] [facility];  Service: Neurosurgery;  Laterality: N/A;  . LUMBAR LAMINECTOMY/DECOMPRESSION MICRODISCECTOMY Bilateral 05/20/2013   Procedure: LUMBAR LAMINECTOMY/DECOMPRESSION MICRODISCECTOMY 1 LEVEL;  Surgeon: Hosie Spangle, MD;  Location: [place] [facility] OR[facility];  Service: Neurosurgery;  Laterality: Bilateral;  Bilateral Lumbar four-five laminotomy and left lumbar four-five microdiskectomy  . SPINE SURGERY     Dr Carloyn Manner -  vertebraplasty     Home Medications:  Prior to Admission medications   Medication Sig Start Date End Date Taking? Authorizing Provider  albuterol (PROVENTIL HFA;VENTOLIN HFA) 108 (90 Base) MCG/ACT inhaler Inhale 2 puffs into the lungs every 6 (six) hours as needed for wheezing or shortness of breath (cough). 01/28/19  Yes Gerlene Fee, NP  acetaminophen (TYLENOL) 500 MG tablet Take 500 mg by mouth every 8 (eight) hours.  01/20/19   [provider]  aspirin EC 325 MG tablet Take 1 tablet (325 mg total) by mouth daily. 01/12/19 01/12/20  Kathie Dike, MD  Balsam [place]-Castor Oil [facility]) OINT Apply topically to Sacrum and bilateral buttocks every shift and as needed for prevention 01/13/19   [provider]  cholecalciferol (VITAMIN D) 1000 UNITS tablet Take 2,000-4,000 Units by mouth daily. Take 2000 units daily except take 4000 units on Saturday and Sunday.    [provider]  docusate sodium (COLACE) 100 MG capsule Take 2 capsules (200 mg total) by mouth 2 (two) times daily. 01/12/19   Kathie Dike, MD  escitalopram (LEXAPRO) 20 MG tablet Take 1 tablet (20 mg total) by mouth at bedtime. 01/28/19   Gerlene Fee, NP  FeFum-FePoly-FA-B Cmp-C-Biot (INTEGRA PLUS) CAPS Take 1 capsule by mouth daily. 01/28/19   Gerlene Fee, NP  ferrous sulfate (FEOSOL) 325 (65 FE) MG tablet Take 1  tablet (325 mg total) by mouth 3 (three) times daily with meals. 01/28/19   Gerlene Fee, NP  Lidocaine 4 % PTCH Apply 1 patch to left hip and 2nd patch to lumbar back and remove at [facility] for pain management 01/21/19   [provider]  magic mouthwash w/lidocaine SOLN Take 5 mLs by mouth 3 (three) times daily as needed for mouth pain. 03/23/19   Terald Sleeper, PA-C  Multiple Vitamin (MULTIVITAMIN WITH MINERALS) TABS tablet Take 1 tablet by mouth daily. Centrum Silver    [provider]  NON FORMULARY Diet Type: NAS 01/12/19   [provider]  Nutritional Supplements (ENSURE ENLIVE PO) Take 1 Bottle by mouth 2 (two) times daily between  meals. 01/13/19   [provider]  nystatin-triamcinolone ointment (MYCOLOG) Apply 1 application topically 2 (two) times daily. 02/12/19   Chipper Herb, MD  pantoprazole (PROTONIX) 40 MG tablet Take 1 tablet (40 mg total) by mouth 2 (two) times daily before a meal. 01/28/19   Nyoka Cowden, Phylis Bougie, NP  vitamin B-12 (CYANOCOBALAMIN) 1000 MCG tablet Take 1,000 mcg by mouth daily.    [provider]    Inpatient Medications: Scheduled Meds: . ALPRAZolam  0.5 mg Oral TID  . diltiazem  10 mg Intravenous Once  . docusate sodium  200 mg Oral BID  . escitalopram  20 mg Oral QHS  . feeding supplement (ENSURE ENLIVE)  237 mL Oral BID BM  . ferrous [id number] C-folic acid  1 capsule Oral QAC breakfast  . multivitamin with minerals  1 tablet Oral Daily  . nystatin-triamcinolone ointment  1 application Topical BID  . pantoprazole  40 mg Oral BID AC  . sodium chloride flush  3 mL Intravenous Q12H  . vitamin B-12  1,000 mcg Oral Daily   Continuous Infusions: . sodium chloride    . diltiazem (CARDIZEM) infusion 15 mg/hr (03/26/19 0411)  . heparin 900 Units/hr (03/26/19 0028)   PRN Meds: sodium chloride, acetaminophen **OR** acetaminophen, albuterol, magic mouthwash w/lidocaine, sodium chloride flush  Allergies:     Allergies  Allergen Reactions  . Codeine Nausea And Vomiting  . Tramadol Nausea And Vomiting  . Asa [Aspirin] Other (See Comments)    Patient is unaware of allergy  . Azithromycin Other (See Comments)    Patient is unaware of allergy  . Celebrex [Celecoxib] Other (See Comments)    Irritated stomach   . Cymbalta [Duloxetine Hcl] Other (See Comments)    Felt groggy  . Prednisone Rash    She has seen the podiatrist and he had injected cortisone in her feet and she had also taken a peel at home. She had a severe reaction to her face with a rash and had to take Benadryl to resolve the symptom. She actually did get more cortisone shots, but no additional reactions to the shots.  . Vioxx [Rofecoxib] Other (See Comments)    Unknown  . Zelnorm [Tegaserod] Other (See Comments)    Patient is unaware of allergy  . Zocor [Simvastatin] Other (See Comments)    Patient is unaware of allergy    Social History:   Social History   Socioeconomic History  . Marital status: Married    Spouse name: Sterling Big   . Number of children: Not on file  . Years of education: Not on file  . Highest education level: Not on file  Occupational History  . Occupation: retired     Comment: [place] work - came out in [employer]  . Financial resource strain: Not on file  . Food insecurity:    Worry: Not on file    Inability: Not on file  . Transportation needs:    Medical: Not on file    Non-medical: Not on file  Tobacco Use  . Smoking status: Current Some Day Smoker    Packs/day: 1.00    Years: 52.00    Pack years: 52.00    Types: Cigarettes    Start date: 11/18/1958    Last attempt to quit: 08/20/2011    Years since quitting: 7.6  . Smokeless tobacco: Never Used  . Tobacco comment: 1/2-1pack off and on all her life  Substance and Sexual Activity  . Alcohol use:  No    Alcohol/week: 0.0 standard drinks  . Drug use: No  . Sexual activity: Not Currently  Lifestyle  . Physical activity:    Days per  week: Not on file    Minutes per session: Not on file  . Stress: Not on file  Relationships  . Social connections:    Talks on phone: Not on file    Gets together: Not on file    Attends religious service: Not on file    Active member of club or organization: Not on file    Attends meetings of clubs or organizations: Not on file    Relationship status: Not on file  . Intimate partner violence:    Fear of current or ex partner: Not on file    Emotionally abused: Not on file    Physically abused: Not on file    Forced sexual activity: Not on file  Other Topics Concern  . Not on file  Social History Narrative   Lives at home with husband, Sterling Big.   Had one son- now deceased     Family History:    Family History  Problem Relation Age of Onset  . Hypertension Mother   . Heart attack Mother   . Macular degeneration Father   . Heart disease Father   . Cirrhosis Son   . Heart disease Son   . Colon cancer Neg Hx      ROS:  Please see the history of present illness.  General:no colds or fevers, no weight changes Skin:no rashes or ulcers HEENT:no blurred vision, no congestion CV:see HPI PUL:see HPI GI:no diarrhea constipation or melena, no indigestion GU:no hematuria, no dysuria MS:no joint pain, no claudication recent fall Lt hip fx and pinning.  Neuro:no syncope, no lightheadedness Endo:no diabetes, no thyroid disease  All other ROS reviewed and negative.     Physical Exam/Data:   Vitals:   03/26/19 0005 03/26/19 0133 03/26/19 0626 03/26/19 0802  BP: 126/77 103/81 109/66 111/74  Pulse: 98 95 (!) 49 92  Resp: (!) 24 (!) 26  (!) 25  Temp:  (!) 97.5 F (36.4 C) 97.6 F (36.4 C) 97.7 F (36.5 C)  TempSrc:  Oral  Oral  SpO2: 96% 96% 93% 92%  Weight:      Height:  5' 7"  (1.702 m)      Intake/Output Summary (Last 24 hours) at 03/26/2019 1004 Last data filed at 03/26/2019 0700 Gross per 24 hour  Intake 988.94 ml  Output 1200 ml  Net -211.06 ml   Last 3 Weights  03/25/2019 02/24/2019 01/28/2019  Weight (lbs) 125 lb 127 lb 127 lb  Weight (kg) 56.7 kg 57.607 kg 57.607 kg     Body mass index is 19.58 kg/m.  Per Dr. Acie Fredrickson  Physical Exam: Blood pressure 111/74, pulse 92, temperature 97.7 F (36.5 C), temperature source Oral, resp. rate (!) 25, height 5' 7"  (1.702 m), weight 56.7 kg, SpO2 92 %.  GEN:  Thin, elderly female,  NAD  HEENT: Normal NECK: No JVD; No carotid bruits LYMPHATICS: No lymphadenopathy CARDIAC: RRR , no murmurs, no rub  RESPIRATORY:  Clear to auscultation without rales, wheezing or rhonchi  ABDOMEN: Soft, non-tender, non-distended MUSCULOSKELETAL:  No edema; No deformity  SKIN: Warm and dry NEUROLOGIC:  Alert and oriented x 3    Relevant CV Studies: Echo pending   High resolution CT of chest 2019 with aortic atherosclerosis and lt main and 2 vessel CAD.  Also emphysema  Laboratory Data:  Chemistry  Recent Labs  Lab 03/25/19 2033  NA 142  K 2.9*  CL 95*  CO2 24  GLUCOSE 230*  BUN 21  CREATININE 1.21*  CALCIUM 8.6*  GFRNONAA 43*  GFRAA 50*  ANIONGAP >20*    No results for input(s): PROT, ALBUMIN, AST, ALT, ALKPHOS, BILITOT in the last 168 hours. Hematology Recent Labs  Lab 03/25/19 2033 03/26/19 0544  WBC 9.3 8.7  RBC 4.42 3.84*  HGB 12.7 10.9*  HCT 43.2 35.4*  MCV 97.7 92.2  MCH 28.7 28.4  MCHC 29.4* 30.8  RDW 17.1* 16.7*  PLT 285 237   Cardiac Enzymes Recent Labs  Lab 03/25/19 2033 03/25/19 2303  TROPONINI 0.14* 0.11*   No results for input(s): TROPIPOC in the last 168 hours.  BNP Recent Labs  Lab 03/25/19 2033  BNP >4,500.0*    DDimer  Recent Labs  Lab 03/25/19 2122  DDIMER 1.69*    Radiology/Studies:  Dg Chest Port 1 View  Result Date: 03/25/2019 CLINICAL DATA:  Shortness of breath. EXAM: PORTABLE CHEST 1 VIEW COMPARISON:  01/06/2019 FINDINGS: The cardiac silhouette is enlarged. There is a right lower lung zone airspace opacity favored to represent a combination of a right-sided  pleural effusion and atelectasis/consolidation. No pneumothorax. There is a likely a trace left-sided effusion. A retrocardiac opacity is noted. Generalized volume overload is seen. IMPRESSION: 1. Interval development of a right lower lung zone opacity favored to represent a combination of a small to moderate size right-sided pleural effusion with adjacent atelectasis. A superimposed infectious process cannot be excluded. 2. Trace to small left-sided pleural effusion. New retrocardiac opacity which may represent atelectasis or infiltrate. 3. Mild cardiomegaly. There is evidence of generalized volume overload. Electronically Signed   By: Constance Holster M.D.   On: 03/25/2019 20:55    Assessment and Plan:   1. A fib/flutter with RVR now improved rate on dilt drip.  This is in combination with PNA.  Control with dilt and change to po dilt depending on results of echo, BB may be difficult with her emphysema but no home 02.   CHA2DS2VASc of 3 on IV heaprin now, pt does have hx of recent fall but would add anticoagulation with eliquis due to risk of stroke. 2. Elevated troponin with CAD on CTA of chest. Troponin is most likely demand ischemia with RVR. No chest pain.   3. Acute HF, echo pending, may be related to a fib and her PNA.  Received lasix 20 mg once in ER and had 1200 cc out but with intake she is neg 211 cc 4. Elevated d dimer CTA of chest is ordered and pt is on IV heaprin 5. HCAP per IM on ABX blood cultures pending and COVID neg.  6. Hypokalemia with  Several runs of IV K+ follow up labs pending.  7. Tobacco use discussed need to stop 8. Hx of iron def. anemia       For questions or updates, please contact [place] Please consult [email or web address] for contact info under     Signed, Cecilie Kicks, NP  03/26/2019 10:04 AM   Attending Note:   The patient was seen and examined.  Agree with assessment and plan as noted above.  Changes made to the above note as needed.  Patient seen  and independently examined with  Cecilie Kicks, NP .   We discussed all aspects of the encounter. I agree with the assessment and plan as stated above.  1.   Paroxysmal atrial fib:  Patient was admitted with pneumonia and was incidentally found to have atrial fibrillation/atrial flutter with a rapid ventricular response.  She has converted back to normal sinus rhythm.  She is feeling better. She has a CHADS2VASC of 3.  I do think that she should be discharged on a DOAC. Echocardiogram has been performed and the results are pending.  2.  Elevated troponin level.  I suspect that this is due to demand ischemia in the setting of pneumonia and rapid atrial fibrillation.  She does have coronary artery calcifications on CT scan.  If she has other episodes of chest discomfort we might consider performing a stress test.  She has been advised to stop smoking.  3.  Follow-up: The patient lives in [place].  She would like like to see 1 of our cardiologist up in the [place] office.   I have spent a total of 40 minutes with patient reviewing hospital  notes , telemetry, EKGs, labs and examining patient as well as establishing an assessment and plan that was discussed with the patient. > 50% of time was spent in direct patient care.    Thayer Headings, Brooke Bonito., MD, [facility] 03/26/2019, 11:15 AM [address],  [employer] Pager [phone]

## 2019-03-26 NOTE — Progress Notes (Signed)
ANTICOAGULATION CONSULT NOTE   Pharmacy Consult for Heparin  Indication: atrial fibrillation  Allergies  Allergen Reactions  . Codeine Nausea And Vomiting  . Tramadol Nausea And Vomiting  . Asa [Aspirin] Other (See Comments)    Patient is unaware of allergy  . Azithromycin Other (See Comments)    Patient is unaware of allergy  . Celebrex [Celecoxib] Other (See Comments)    Irritated stomach   . Cymbalta [Duloxetine Hcl] Other (See Comments)    Felt groggy  . Prednisone Rash    She has seen the podiatrist and he had injected cortisone in her feet and she had also taken a peel at home. She had a severe reaction to her face with a rash and had to take Benadryl to resolve the symptom. She actually did get more cortisone shots, but no additional reactions to the shots.  . Vioxx [Rofecoxib] Other (See Comments)    Unknown  . Zelnorm [Tegaserod] Other (See Comments)    Patient is unaware of allergy  . Zocor [Simvastatin] Other (See Comments)    Patient is unaware of allergy    Patient Measurements: Height: 5\' 7"  (170.2 cm) Weight: 125 lb (56.7 kg) IBW/kg (Calculated) : 61.6 Heparin Dosing Weight: HEPARIN DW (KG): 56.7   Vital Signs: Temp: 97.7 F (36.5 C) (05/08 0802) Temp Source: Oral (05/08 0802) BP: 111/74 (05/08 0802) Pulse Rate: 102 (05/08 1255)  Labs: Recent Labs    03/25/19 2033 03/25/19 2303 03/26/19 0544 03/26/19 0932 03/26/19 1120  HGB 12.7  --  10.9*  --   --   HCT 43.2  --  35.4*  --   --   PLT 285  --  237  --   --   HEPARINUNFRC  --   --  0.32  --  0.37  CREATININE 1.21*  --   --   --   --   TROPONINI 0.14* 0.11*  --  0.15*  --     Estimated Creatinine Clearance: 34.9 mL/min (A) (by C-G formula based on SCr of 1.21 mg/dL (H)).   Medical History: Past Medical History:  Diagnosis Date  . Anxiety    takes Xanax daily  . Arthritis    back  . Cataract   . Chronic back pain   . Constipation    OTC stool softener prn  . COPD (chronic obstructive  pulmonary disease) ([facility])   . Depression   . Diverticulosis   . GERD (gastroesophageal reflux disease)    takes Ranidine daily  . Hemorrhoids   . History of bronchitis   . History of migraine    many yrs ago  . History of shingles   . Hyperlipidemia   . Insomnia   . Migraines   . Nocturia   . Numbness    left leg  . Osteoporosis   . PONV (postoperative nausea and vomiting)   . Urinary frequency   . Urinary urgency    Infusions:  . sodium chloride    . [START ON 03/27/2019] ceFEPime (MAXIPIME) IV    . diltiazem (CARDIZEM) infusion 15 mg/hr (03/26/19 1222)  . heparin 900 Units/hr (03/26/19 0028)  . [START ON 03/27/2019] vancomycin     PRN:  Anti-infectives (From admission, onward)   Start     Dose/Rate Route Frequency Ordered Stop   03/27/19 2000  vancomycin (VANCOCIN) IVPB 1000 mg/200 mL premix     1,000 mg 200 mL/hr over 60 Minutes Intravenous Every 36 hours 03/26/19 1029     03/27/19  1000  ceFEPIme (MAXIPIME) 1 g in sodium chloride 0.9 % 100 mL IVPB     1 g 200 mL/hr over 30 Minutes Intravenous Every 24 hours 03/26/19 1029     03/26/19 0115  vancomycin (VANCOCIN) IVPB 1000 mg/200 mL premix     1,000 mg 200 mL/hr over 60 Minutes Intravenous  Once 03/26/19 0101 03/26/19 0915   03/26/19 0115  ceFEPIme (MAXIPIME) 2 g in sodium chloride 0.9 % 100 mL IVPB     2 g 200 mL/hr over 30 Minutes Intravenous  Once 03/26/19 0101 03/26/19 1001   03/25/19 2230  cefTRIAXone (ROCEPHIN) 1 g in sodium chloride 0.9 % 100 mL IVPB     1 g 200 mL/hr over 30 Minutes Intravenous  Once 03/25/19 2225 03/25/19 2318   03/25/19 2230  doxycycline (VIBRA-TABS) tablet 100 mg     100 mg Oral  Once 03/25/19 2225 03/25/19 2234      Assessment: Misty [name] a 78 y.o. female requires anticoagulation with a heparin IV infusion for the indication of  atrial fibrillation.   Heparin level this after noon remains at goal.  Hgb with slight trend down, but no overt bleeding or complications noted.    Goal  of Therapy:  Heparin level 0.3-0.7 units/ml Monitor platelets by anticoagulation protocol: Yes   Plan:  Cont heparin at 900 units/hr Daily heparin level and CBC. F/u plans to transition to DOAC soon?  Jenetta Downer, [facility] Clinical Pharmacist Phone [phone]  03/26/2019 2:17 PM

## 2019-03-27 ENCOUNTER — Inpatient Hospital Stay (HOSPITAL_COMMUNITY): Payer: Medicare Other

## 2019-03-27 DIAGNOSIS — E876 Hypokalemia: Secondary | ICD-10-CM

## 2019-03-27 DIAGNOSIS — J189 Pneumonia, unspecified organism: Secondary | ICD-10-CM

## 2019-03-27 DIAGNOSIS — I429 Cardiomyopathy, unspecified: Secondary | ICD-10-CM

## 2019-03-27 LAB — BASIC METABOLIC PANEL
Anion gap: 10 (ref 5–15)
BUN: 24 mg/dL — ABNORMAL HIGH (ref 8–23)
CO2: 34 mmol/L — ABNORMAL HIGH (ref 22–32)
Calcium: 8 mg/dL — ABNORMAL LOW (ref 8.9–10.3)
Chloride: 96 mmol/L — ABNORMAL LOW (ref 98–111)
Creatinine, Ser: 0.92 mg/dL (ref 0.44–1.00)
GFR calc Af Amer: >60 mL/min (ref >60)
GFR calc non Af Amer: >60 mL/min (ref >60)
Glucose, Bld: 122 mg/dL — ABNORMAL HIGH (ref 70–99)
Potassium: 2.4 mmol/L — CL (ref 3.5–5.1)
Sodium: 140 mmol/L (ref 135–145)

## 2019-03-27 LAB — CBC
HCT: 33.4 % — ABNORMAL LOW (ref 36.0–46.0)
Hemoglobin: 10.3 g/dL — ABNORMAL LOW (ref 12.0–15.0)
MCH: 28.5 pg (ref 26.0–34.0)
MCHC: 30.8 g/dL (ref 30.0–36.0)
MCV: 92.5 fL (ref 80.0–100.0)
Platelets: 201 10*3/uL (ref 150–400)
RBC: 3.61 MIL/uL — ABNORMAL LOW (ref 3.87–5.11)
RDW: 16.9 % — ABNORMAL HIGH (ref 11.5–15.5)
WBC: 7.2 10*3/uL (ref 4.0–10.5)
nRBC: 0 % (ref 0.0–0.2)

## 2019-03-27 LAB — MAGNESIUM: Magnesium: 1.8 mg/dL (ref 1.7–2.4)

## 2019-03-27 LAB — POTASSIUM: Potassium: 3.1 mmol/L — ABNORMAL LOW (ref 3.5–5.1)

## 2019-03-27 LAB — HEPARIN LEVEL (UNFRACTIONATED): Heparin Unfractionated: 0.41 IU/mL (ref 0.30–0.70)

## 2019-03-27 MED ORDER — IPRATROPIUM-ALBUTEROL 0.5-2.5 (3) MG/3ML IN SOLN
3.0000 mL | RESPIRATORY_TRACT | Status: DC | PRN
  Administered 2019-03-29: 3 mL via RESPIRATORY_TRACT
  Filled 2019-03-27: qty 3

## 2019-03-27 MED ORDER — SODIUM CHLORIDE 0.9 % IV SOLN
2.0000 g | Freq: Two times a day (BID) | INTRAVENOUS | Status: DC
  Administered 2019-03-27 (×2): 2 g via INTRAVENOUS
  Filled 2019-03-27 (×4): qty 2

## 2019-03-27 MED ORDER — FUROSEMIDE 10 MG/ML IJ SOLN
20.0000 mg | Freq: Once | INTRAMUSCULAR | Status: AC
  Administered 2019-03-27: 20 mg via INTRAVENOUS

## 2019-03-27 MED ORDER — POTASSIUM CHLORIDE 10 MEQ/100ML IV SOLN
10.0000 meq | INTRAVENOUS | Status: AC
  Administered 2019-03-27 (×4): 10 meq via INTRAVENOUS
  Filled 2019-03-27 (×3): qty 100

## 2019-03-27 MED ORDER — ALBUTEROL SULFATE (2.5 MG/3ML) 0.083% IN NEBU: 2.5000 mg | INHALATION_SOLUTION | Freq: Once | RESPIRATORY_TRACT | Status: DC

## 2019-03-27 MED ORDER — SPIRONOLACTONE 12.5 MG HALF TABLET
12.5000 mg | ORAL_TABLET | Freq: Every day | ORAL | Status: DC
  Administered 2019-03-27: 12.5 mg via ORAL
  Filled 2019-03-27: qty 1

## 2019-03-27 MED ORDER — IPRATROPIUM BROMIDE 0.02 % IN SOLN
RESPIRATORY_TRACT | Status: AC
  Administered 2019-03-27: 0.5 mg
  Filled 2019-03-27: qty 2.5

## 2019-03-27 MED ORDER — POTASSIUM CHLORIDE CRYS ER 20 MEQ PO TBCR
40.0000 meq | EXTENDED_RELEASE_TABLET | Freq: Once | ORAL | Status: AC
  Administered 2019-03-27: 40 meq via ORAL
  Filled 2019-03-27: qty 2

## 2019-03-27 MED ORDER — BISOPROLOL FUMARATE 5 MG PO TABS
2.5000 mg | ORAL_TABLET | Freq: Every day | ORAL | Status: DC
  Administered 2019-03-27: 2.5 mg via ORAL
  Filled 2019-03-27 (×2): qty 1

## 2019-03-27 MED ORDER — FUROSEMIDE 10 MG/ML IJ SOLN
INTRAMUSCULAR | Status: AC
  Administered 2019-03-27: 20 mg via INTRAVENOUS
  Filled 2019-03-27: qty 2

## 2019-03-27 MED ORDER — IOHEXOL 350 MG/ML SOLN
75.0000 mL | Freq: Once | INTRAVENOUS | Status: AC | PRN
  Administered 2019-03-27: 75 mL via INTRAVENOUS

## 2019-03-27 MED ORDER — POTASSIUM CHLORIDE CRYS ER 20 MEQ PO TBCR
20.0000 meq | EXTENDED_RELEASE_TABLET | Freq: Once | ORAL | Status: AC
  Administered 2019-03-27: 20 meq via ORAL
  Filled 2019-03-27: qty 1

## 2019-03-27 NOTE — Progress Notes (Addendum)
Choked with sips of water. Appears to have reflux. Vomited 100 ml clear water. Wheezing noted. Sats dropped to low 90's on O2@2L  [place]. PO meds held for now.

## 2019-03-27 NOTE — Progress Notes (Signed)
[place] for Heparin  Indication: atrial fibrillation  Allergies  Allergen Reactions  . Codeine Nausea And Vomiting  . Tramadol Nausea And Vomiting  . Asa [Aspirin] Other (See Comments)    Patient is unaware of allergy  . Azithromycin Other (See Comments)    Patient is unaware of allergy  . Celebrex [Celecoxib] Other (See Comments)    Irritated stomach   . Cymbalta [Duloxetine Hcl] Other (See Comments)    Felt groggy  . Prednisone Rash    She has seen the podiatrist and he had injected cortisone in her feet and she had also taken a peel at home. She had a severe reaction to her face with a rash and had to take Benadryl to resolve the symptom. She actually did get more cortisone shots, but no additional reactions to the shots.  . Vioxx [Rofecoxib] Other (See Comments)    Unknown  . Zelnorm [Tegaserod] Other (See Comments)    Patient is unaware of allergy  . Zocor [Simvastatin] Other (See Comments)    Patient is unaware of allergy    Patient Measurements: Height: 5' 7"  (170.2 cm) Weight: 126 lb 1.7 oz (57.2 kg) IBW/kg (Calculated) : 61.6 Heparin Dosing Weight: HEPARIN DW (KG): 56.7   Vital Signs: Temp: 98.1 F (36.7 C) (05/09 0456) Temp Source: Oral (05/09 0456) BP: 103/59 (05/09 0456) Pulse Rate: 72 (05/09 0456)  Labs: Recent Labs    03/25/19 2033  03/26/19 0544 03/26/19 0932 03/26/19 1120 03/26/19 1458 03/26/19 2054 03/27/19 0341 03/27/19 0713  HGB 12.7  --  10.9*  --   --   --   --  10.3*  --   HCT 43.2  --  35.4*  --   --   --   --  33.4*  --   PLT 285  --  237  --   --   --   --  201  --   HEPARINUNFRC  --   --  0.32  --  0.37  --   --   --  0.41  CREATININE 1.21*  --   --   --   --   --   --  0.92  --   TROPONINI 0.14*   < >  --  0.15*  --  0.15* 0.14*  --   --    < > = values in this interval not displayed.    Estimated Creatinine Clearance: 46.2 mL/min (by C-G formula based on SCr of 0.92 mg/dL).   Medical  History: Past Medical History:  Diagnosis Date  . Anxiety    takes Xanax daily  . Arthritis    back  . Cataract   . Chronic back pain   . Constipation    OTC stool softener prn  . COPD (chronic obstructive pulmonary disease) ([place])   . Depression   . Diverticulosis   . GERD (gastroesophageal reflux disease)    takes Ranidine daily  . Hemorrhoids   . History of bronchitis   . History of migraine    many yrs ago  . History of shingles   . Hyperlipidemia   . Insomnia   . Migraines   . Nocturia   . Numbness    left leg  . Osteoporosis   . PONV (postoperative nausea and vomiting)   . Urinary frequency   . Urinary urgency    Infusions:  . sodium chloride    . ceFEPime (MAXIPIME) IV    .  diltiazem (CARDIZEM) infusion 15 mg/hr (03/27/19 0348)  . heparin 900 Units/hr (03/26/19 1925)  . potassium chloride 10 mEq (03/27/19 0753)  . vancomycin     PRN:  Anti-infectives (From admission, onward)   Start     Dose/Rate Route Frequency Ordered Stop   03/27/19 2000  vancomycin (VANCOCIN) IVPB 1000 mg/200 mL premix     1,000 mg 200 mL/hr over 60 Minutes Intravenous Every 36 hours 03/26/19 1029     03/27/19 1000  ceFEPIme (MAXIPIME) 1 g in sodium chloride 0.9 % 100 mL IVPB     1 g 200 mL/hr over 30 Minutes Intravenous Every 24 hours 03/26/19 1029     03/26/19 0115  vancomycin (VANCOCIN) IVPB 1000 mg/200 mL premix     1,000 mg 200 mL/hr over 60 Minutes Intravenous  Once 03/26/19 0101 03/26/19 0915   03/26/19 0115  ceFEPIme (MAXIPIME) 2 g in sodium chloride 0.9 % 100 mL IVPB     2 g 200 mL/hr over 30 Minutes Intravenous  Once 03/26/19 0101 03/26/19 1045   03/25/19 2230  cefTRIAXone (ROCEPHIN) 1 g in sodium chloride 0.9 % 100 mL IVPB     1 g 200 mL/hr over 30 Minutes Intravenous  Once 03/25/19 2225 03/25/19 2318   03/25/19 2230  doxycycline (VIBRA-TABS) tablet 100 mg     100 mg Oral  Once 03/25/19 2225 03/25/19 2234      Assessment: Misty [name] a 78 y.o. female requires  anticoagulation with a heparin IV infusion for the indication of new onset atrial fibrillation.   Heparin level this remains at goal at 0.41.  Hgb at 10.3 with slight trend down, but no overt bleeding or complications noted and confirmed with nursing.   Goal of Therapy:  Heparin level 0.3-0.7 units/ml Monitor platelets by anticoagulation protocol: Yes   Plan:  Cont heparin at 900 units/hr Daily heparin level and CBC F/u plans to transition to DOAC  Azzie Roup D PGY1 Pharmacy Resident  Phone [phone] Please use [facility] for clinical pharmacists numbers  03/27/2019      8:19 AM

## 2019-03-27 NOTE — Progress Notes (Signed)
Progress Note  Patient Name: Misty [name] Date of Encounter: 03/27/2019  Primary Cardiologist: Mertie Moores, MD  Subjective   Feels weak.  No chest pain.  Mild shortness of breath, no cough.  Inpatient Medications    Scheduled Meds:  ALPRAZolam  0.5 mg Oral TID   diltiazem  10 mg Intravenous Once   docusate sodium  200 mg Oral BID   escitalopram  20 mg Oral QHS   feeding supplement (ENSURE ENLIVE)  237 mL Oral BID BM   ferrous [id number] C-folic acid  1 capsule Oral QAC breakfast   multivitamin with minerals  1 tablet Oral Daily   nystatin-triamcinolone ointment  1 application Topical BID   pantoprazole  40 mg Oral BID AC   potassium chloride  40 mEq Oral Once   sodium chloride flush  3 mL Intravenous Q12H   vitamin B-12  1,000 mcg Oral Daily   Continuous Infusions:  sodium chloride     ceFEPime (MAXIPIME) IV     diltiazem (CARDIZEM) infusion 15 mg/hr (03/27/19 0348)   heparin 900 Units/hr (03/26/19 1925)   potassium chloride 10 mEq (03/27/19 0753)   vancomycin     PRN Meds: sodium chloride, acetaminophen **OR** acetaminophen, albuterol, magic mouthwash w/lidocaine, sodium chloride flush   Vital Signs    Vitals:   03/26/19 1958 03/26/19 2028 03/26/19 2358 03/27/19 0456  BP: (!) 105/57  (!) 93/59 (!) 103/59  Pulse:    72  Resp: (!) 26  19 19   Temp:  97.6 F (36.4 C)  98.1 F (36.7 C)  TempSrc:  Oral  Oral  SpO2:    95%  Weight:    57.2 kg  Height:        Intake/Output Summary (Last 24 hours) at 03/27/2019 0909 Last data filed at 03/27/2019 0500 Gross per 24 hour  Intake 216 ml  Output 200 ml  Net 16 ml   Filed Weights   03/25/19 2018 03/27/19 0456  Weight: 56.7 kg 57.2 kg    Telemetry    Intermittent atrial fibrillation and apparent sinus rhythm with PACs.  Personally reviewed.  ECG    Tracing from 03/25/2019 shows possible atrial flutter with 2:1 block.  Personally reviewed.  Physical Exam   GEN:  Elderly woman.   No acute distress.   Neck: No JVD. Cardiac:  Irregular, no gallop.  Respiratory: Nonlabored.  Decreased breath sounds without wheezing. GI: Soft, nontender, bowel sounds present. MS: No edema; No deformity. Neuro:  Nonfocal. Psych: Alert and oriented x 3. Normal affect.  Labs    Chemistry Recent Labs  Lab 03/25/19 2033 03/27/19 0341  NA 142 140  K 2.9* 2.4*  CL 95* 96*  CO2 24 34*  GLUCOSE 230* 122*  BUN 21 24*  CREATININE 1.21* 0.92  CALCIUM 8.6* 8.0*  GFRNONAA 43* >60  GFRAA 50* >60  ANIONGAP >20* 10     Hematology Recent Labs  Lab 03/25/19 2033 03/26/19 0544 03/27/19 0341  WBC 9.3 8.7 7.2  RBC 4.42 3.84* 3.61*  HGB 12.7 10.9* 10.3*  HCT 43.2 35.4* 33.4*  MCV 97.7 92.2 92.5  MCH 28.7 28.4 28.5  MCHC 29.4* 30.8 30.8  RDW 17.1* 16.7* 16.9*  PLT 285 237 201    Cardiac Enzymes Recent Labs  Lab 03/25/19 2303 03/26/19 0932 03/26/19 1458 03/26/19 2054  TROPONINI 0.11* 0.15* 0.15* 0.14*   No results for input(s): TROPIPOC in the last 168 hours.   BNP Recent Labs  Lab 03/25/19 2033  BNP >4,500.0*  DDimer  Recent Labs  Lab 03/25/19 2122  DDIMER 1.69*     Radiology    Dg Chest Port 1 View  Result Date: 03/25/2019 CLINICAL DATA:  Shortness of breath. EXAM: PORTABLE CHEST 1 VIEW COMPARISON:  01/06/2019 FINDINGS: The cardiac silhouette is enlarged. There is a right lower lung zone airspace opacity favored to represent a combination of a right-sided pleural effusion and atelectasis/consolidation. No pneumothorax. There is a likely a trace left-sided effusion. A retrocardiac opacity is noted. Generalized volume overload is seen. IMPRESSION: 1. Interval development of a right lower lung zone opacity favored to represent a combination of a small to moderate size right-sided pleural effusion with adjacent atelectasis. A superimposed infectious process cannot be excluded. 2. Trace to small left-sided pleural effusion. New retrocardiac opacity which may  represent atelectasis or infiltrate. 3. Mild cardiomegaly. There is evidence of generalized volume overload. Electronically Signed   By: Constance Holster M.D.   On: 03/25/2019 20:55    Cardiac Studies   Echocardiogram 03/26/2019:  1. The left ventricle has moderate-severely reduced systolic function, with an ejection fraction of 30-35%. The cavity size was normal. There is mildly increased left ventricular wall thickness. Left ventricular diastolic function could not be evaluated  secondary to atrial fibrillation. There is pre-excitation of the interventricular septum. Left ventricular diffuse hypokinesis.  2. The right ventricle has mildly reduced systolic function. The cavity was mildly enlarged. There is no increase in right ventricular wall thickness.  3. Left atrial size was moderately dilated.  4. Right atrial size was mildly dilated.  5. Trivial pericardial effusion is present.  6. The mitral valve is abnormal. Mild thickening of the mitral valve leaflet. Mitral valve regurgitation is moderate by color flow Doppler.  7. The tricuspid valve was grossly normal.  8. The aortic valve is tricuspid Mild sclerosis of the aortic valve. Aortic valve regurgitation is trivial by color flow Doppler. No stenosis of the aortic valve.  9. The inferior vena cava was dilated in size with <50% respiratory variability.  Patient Profile     78 y.o. female with a history of COPD and tobacco abuse, hyperlipidemia, chronic back pain, admitted with pneumonia and newly documented rapid atrial fibrillation/flutter.  Assessment & Plan    1.  Atrial fibrillation/flutter, newly documented in initially with RVR.  Rates have been better controlled on intravenous diltiazem. CHADSVASC score is 5.  On IV heparin at this time.  2.  Newly documented cardiomyopathy with LVEF 30 to 35% and diffuse hypokinesis.  Also mildly reduced right ventricular contraction.  3.  Mild troponin I elevation and relatively flat pattern  around 0.15, most likely indicative of demand ischemia.  She does have atherosclerosis documented by prior CT imaging.  4.  Community-acquired pneumonia.  Continues on antibiotics per primary tear.  SARS coronavirus 2 negative.  5.  COPD.  6.  Severe hypokalemia, repletion continues per primary team.  I reviewed hospital course and recent consultation note by Dr. Acie Fredrickson.  Heart rate better controlled but blood pressures have been low as well.  I spoke with nursing about down titrating IV diltiazem and we will try and initiate low-dose bisoprolol in light of her cardiomyopathy favoring beta-blocker for heart rate control if possible.  She needs a follow-up ECG to confirm rhythm.  Continue heparin for stroke prophylaxis at this time.  Blood pressure is not at a point that we can consider addition of ARB to regimen.  Will add low-dose Aldactone.  No aspirin or statin at this  time given reported allergies.  Anticipation titration of medical therapy aimed at both treatment of arrhythmia and cardiomyopathy, and depending on her clinical improvement most likely a cardiac catheterization rather than stress testing for clarification of coronary anatomy.  Would pursue that prior to starting her on DOAC.  Signed, Rozann Lesches, MD  03/27/2019, 9:09 AM

## 2019-03-27 NOTE — Progress Notes (Signed)
[facility]                                                                              Patient Demographics  Misty [name], is a 78 y.o. female, DOB - [birth year], JOA:[id number]  Admit date - 03/25/2019   Admitting Physician Jani Gravel, MD  Outpatient Primary MD for the patient is Chipper Herb, MD  Outpatient specialists:   LOS - 2  days   Medical records reviewed and are as summarized below:    Chief Complaint  Patient presents with  . Shortness of Breath       Brief summary   Patient is a 78 year old female with COPD, nicotine dependence, hyperlipidemia, GERD, anxiety presented with dyspnea for past 1 week.  Patient reported slight cough, slight palpitations.  Otherwise denied fever chills chest pain nausea vomiting.  In ED, patient was noted to be in rapid atrial fibrillation with heart rate in 130s -140s.  EKG showed heart rate of 140, LAD, Q waves in V1 to V3 Chest x-ray showed interval development of right lower lung zone opacity favored heart rate size right-sided pleural effusion, chest atelectasis, superimposed infectious process cannot be excluded, trace to small left-sided pleural effusion.  New retrocardiac opacity may represent atelectasis or infiltrates.  Patient was started on Cardizem drip in ED. K 2.9, BNP> 4500  COVID negative  Assessment & Plan    Principal Problem:   Atrial fibrillation with RVR (HCC)-new onset -Still on Cardizem drip, heparin drip.   -Appreciate cardiology recommendations, weaning down Cardizem drip and started on beta-blocker.  BP currently soft 80s, will hold Aldactone. -Troponin elevated 0.14-> 0.11, on heparin drip -BNP> 4500, d-dimer 1.69 -CT angiogram negative for PE.  Large right pleural effusion with associated atelectasis of the right lower lobe. -TSH 2.2 2D echo showed EF of 30 to 35%, in A. fib, diffuse hypokinesis.   -2D echo should be repeated when converted to normal sinus rhythm or when heart rate  is controlled.  Active Problems: COPD, community-acquired pneumonia -Continue vancomycin and cefepime, COVID negative.  Blood cultures negative so far.   Elevated troponin -Likely due to #1, currently on heparin drip, cardiology consulted    GERD (gastroesophageal reflux disease) -Continue PPI    Malnutrition of moderate degree -Placed on nutritional supplements    Hypokalemia -Potassium 2.4, aggressively replaced, repeat check 3.1 - will give another dose of K-Dur 20 M EQ    Anxiety and depression -Continue Xanax  History of iron deficiency anemia -Hemoglobin stable, at baseline, follow closely  Lactic acidosis -Improving  Right sided large pleural effusion -Likely precipitated due to #1 and pneumonia -CT scan shows large right-sided pleural effusion, currently on heparin drip and not started on the noac -obtain ultrasound-guided thoracentesis   Code Status: Full code DVT Prophylaxis: Heparin drip Family Communication: Discussed in detail with the patient, all imaging results, lab results explained to the patient and patient's husband on the phone   Disposition Plan: Weaning down Cardizem drip and starting beta-blocker, however BP to soft.  Continue progressive care and inpatient, high risk of decompensation.  Needs thoracentesis.  Time Spent in minutes  35 minutes  Procedures:  2D echo   Consultants:   Cardiology  Antimicrobials:   Anti-infectives (From admission, onward)   Start     Dose/Rate Route Frequency Ordered Stop   03/27/19 2000  vancomycin (VANCOCIN) IVPB 1000 mg/200 mL premix     1,000 mg 200 mL/hr over 60 Minutes Intravenous Every 36 hours 03/26/19 1029     03/27/19 1000  ceFEPIme (MAXIPIME) 1 g in sodium chloride 0.9 % 100 mL IVPB  Status:  Discontinued     1 g 200 mL/hr over 30 Minutes Intravenous Every 24 hours 03/26/19 1029 03/27/19 0912   03/27/19 1000  ceFEPIme (MAXIPIME) 2 g in sodium chloride 0.9 % 100 mL IVPB     2 g 200 mL/hr over  30 Minutes Intravenous Every 12 hours 03/27/19 0912     03/26/19 0115  vancomycin (VANCOCIN) IVPB 1000 mg/200 mL premix     1,000 mg 200 mL/hr over 60 Minutes Intravenous  Once 03/26/19 0101 03/26/19 0915   03/26/19 0115  ceFEPIme (MAXIPIME) 2 g in sodium chloride 0.9 % 100 mL IVPB     2 g 200 mL/hr over 30 Minutes Intravenous  Once 03/26/19 0101 03/26/19 1045   03/25/19 2230  cefTRIAXone (ROCEPHIN) 1 g in sodium chloride 0.9 % 100 mL IVPB     1 g 200 mL/hr over 30 Minutes Intravenous  Once 03/25/19 2225 03/25/19 2318   03/25/19 2230  doxycycline (VIBRA-TABS) tablet 100 mg     100 mg Oral  Once 03/25/19 2225 03/25/19 2234         Medications  Scheduled Meds: . ALPRAZolam  0.5 mg Oral TID  . bisoprolol  2.5 mg Oral Daily  . diltiazem  10 mg Intravenous Once  . docusate sodium  200 mg Oral BID  . escitalopram  20 mg Oral QHS  . feeding supplement (ENSURE ENLIVE)  237 mL Oral BID BM  . ferrous [id number] C-folic acid  1 capsule Oral QAC breakfast  . multivitamin with minerals  1 tablet Oral Daily  . nystatin-triamcinolone ointment  1 application Topical BID  . pantoprazole  40 mg Oral BID AC  . sodium chloride flush  3 mL Intravenous Q12H  . spironolactone  12.5 mg Oral Daily  . vitamin B-12  1,000 mcg Oral Daily   Continuous Infusions: . sodium chloride    . ceFEPime (MAXIPIME) IV 2 g (03/27/19 1113)  . diltiazem (CARDIZEM) infusion Stopped (03/27/19 1103)  . heparin 900 Units/hr (03/26/19 1925)  . vancomycin     PRN Meds:.sodium chloride, acetaminophen **OR** acetaminophen, albuterol, magic mouthwash w/lidocaine, sodium chloride flush      Subjective:   Misty [name] was seen and examined today.  Anxiety better with Xanax.  No fevers or chills.  Shortness of breath improving.    Patient denies dizziness,  abdominal pain, N/V/D/C, new weakness, numbess, tingling. No acute events overnight.    Objective:   Vitals:   03/27/19 0830 03/27/19 0900 03/27/19  1100 03/27/19 1425  BP: (!) 85/63 (!) 86/57 112/75 (!) 83/72  Pulse:    90  Resp:    20  Temp:    97.6 F (36.4 C)  TempSrc:    Axillary  SpO2:    98%  Weight:      Height:        Intake/Output Summary (Last 24 hours) at 03/27/2019 1517 Last data filed at 03/27/2019 0500 Gross per 24 hour  Intake 126 ml  Output 200 ml  Net -74  ml     Wt Readings from Last 3 Encounters:  03/27/19 57.2 kg  02/24/19 57.6 kg  01/28/19 57.6 kg   Physical Exam  General: Alert and oriented x 3, NAD, frail and ill-appearing   eyes:   HEENT:    Cardiovascular:  irregularly irregular  Respiratory: Decreased breath sound at the base  Rt >Lt  Gastrointestinal: Soft, nontender, nondistended, NBS  Ext: no pedal edema bilaterally  Neuro: no new deficits  Musculoskeletal: No cyanosis, clubbing  Skin: No rashes  Psych: Normal affect and demeanor, alert and oriented x3    Data Reviewed:  I have personally reviewed following labs and imaging studies  Micro Results Recent Results (from the past 240 hour(s))  SARS Coronavirus 2 (CEPHEID- Performed in [place] hospital lab), Hosp Order     Status: None   Collection Time: 03/25/19  8:58 PM  Result Value Ref Range Status   SARS Coronavirus 2 NEGATIVE NEGATIVE Final    Comment: (NOTE) If result is NEGATIVE SARS-CoV-2 target nucleic acids are NOT DETECTED. The SARS-CoV-2 RNA is generally detectable in upper and lower  respiratory specimens during the acute phase of infection. The lowest  concentration of SARS-CoV-2 viral copies this assay can detect is 250  copies / mL. A negative result does not preclude SARS-CoV-2 infection  and should not be used as the sole basis for treatment or other  patient management decisions.  A negative result may occur with  improper specimen collection / handling, submission of specimen other  than nasopharyngeal swab, presence of viral mutation(s) within the  areas targeted by this assay, and inadequate  number of viral copies  (<250 copies / mL). A negative result must be combined with clinical  observations, patient history, and epidemiological information. If result is POSITIVE SARS-CoV-2 target nucleic acids are DETECTED. The SARS-CoV-2 RNA is generally detectable in upper and lower  respiratory specimens dur ing the acute phase of infection.  Positive  results are indicative of active infection with SARS-CoV-2.  Clinical  correlation with patient history and other diagnostic information is  necessary to determine patient infection status.  Positive results do  not rule out bacterial infection or co-infection with other viruses. If result is PRESUMPTIVE POSTIVE SARS-CoV-2 nucleic acids MAY BE PRESENT.   A presumptive positive result was obtained on the submitted specimen  and confirmed on repeat testing.  While 2019 novel coronavirus  (SARS-CoV-2) nucleic acids may be present in the submitted sample  additional confirmatory testing may be necessary for epidemiological  and / or clinical management purposes  to differentiate between  SARS-CoV-2 and other Sarbecovirus currently known to infect humans.  If clinically indicated additional testing with an alternate test  methodology [phone]) is advised. The SARS-CoV-2 RNA is generally  detectable in upper and lower respiratory sp ecimens during the acute  phase of infection. The expected result is Negative. Fact Sheet for Patients:  StrictlyIdeas.no Fact Sheet for Healthcare Providers: BankingDealers.co.za This test is not yet approved or cleared by the [place] FDA and has been authorized for detection and/or diagnosis of SARS-CoV-2 by FDA under an Emergency Use Authorization (EUA).  This EUA will remain in effect (meaning this test can be used) for the duration of the COVID-19 declaration under Section 564(b)(1) of the Act, 21 U.S.C. section 360bbb-3(b)(1), unless the  authorization is terminated or revoked sooner. Performed at [facility], [address], [place], [place] [address]   Culture, blood (routine x 2)     Status: None (  Preliminary result)   Collection Time: 03/25/19  9:22 PM  Result Value Ref Range Status   Specimen Description BLOOD LEFT ANTECUBITAL  Final   Special Requests   Final    BOTTLES DRAWN AEROBIC AND ANAEROBIC Blood Culture adequate volume   Culture   Final    NO GROWTH 2 DAYS Performed at [facility], [address], [place], [place] [address]    Report Status PENDING  Incomplete  Culture, blood (routine x 2)     Status: None (Preliminary result)   Collection Time: 03/25/19  9:23 PM  Result Value Ref Range Status   Specimen Description BLOOD BLOOD LEFT WRIST  Final   Special Requests   Final    BOTTLES DRAWN AEROBIC AND ANAEROBIC Blood Culture adequate volume   Culture   Final    NO GROWTH 2 DAYS Performed at [facility], [address] [place], [place] [address]    Report Status PENDING  Incomplete    Radiology Reports Ct Angio Chest Pe W Or Wo Contrast  Result Date: 03/27/2019 CLINICAL DATA:  Dyspnea. EXAM: CT ANGIOGRAPHY CHEST WITH CONTRAST TECHNIQUE: Multidetector CT imaging of the chest was performed using the standard protocol during bolus administration of intravenous contrast. Multiplanar CT image reconstructions and MIPs were obtained to evaluate the vascular anatomy. CONTRAST:  81m OMNIPAQUE IOHEXOL 350 MG/ML SOLN COMPARISON:  Radiograph of Mar 25, 2019.  CT scan of March 05, 2018. FINDINGS: Cardiovascular: Satisfactory opacification of the pulmonary arteries to the segmental level. No evidence of pulmonary embolism. Mild cardiomegaly is noted. No pericardial effusion. Atherosclerosis of thoracic aorta is noted without aneurysm formation. Mediastinum/Nodes: No enlarged mediastinal, hilar, or axillary lymph nodes. Thyroid gland, trachea, and esophagus demonstrate no significant findings. Lungs/Pleura: No pneumothorax  is noted. Large right pleural effusion is noted with associated atelectasis of the right lower lobe. Minimal left pleural effusion is noted with adjacent subsegmental atelectasis. Upper Abdomen: No acute abnormality. Musculoskeletal: Status post kyphoplasty at multiple levels in the lower thoracic spine. New mild compression deformity of T10 vertebral body is noted of indeterminate age. Review of the MIP images confirms the above findings. IMPRESSION: No definite evidence of pulmonary embolus. Large right pleural effusion is noted with associated atelectasis of right lower lobe. New mild compression deformity of T10 vertebral body is noted of indeterminate age. Aortic Atherosclerosis (ICD10-I70.0). Electronically Signed   By: JMarijo ConceptionM.D.   On: 03/27/2019 13:14   Dg Chest Port 1 View  Result Date: 03/25/2019 CLINICAL DATA:  Shortness of breath. EXAM: PORTABLE CHEST 1 VIEW COMPARISON:  01/06/2019 FINDINGS: The cardiac silhouette is enlarged. There is a right lower lung zone airspace opacity favored to represent a combination of a right-sided pleural effusion and atelectasis/consolidation. No pneumothorax. There is a likely a trace left-sided effusion. A retrocardiac opacity is noted. Generalized volume overload is seen. IMPRESSION: 1. Interval development of a right lower lung zone opacity favored to represent a combination of a small to moderate size right-sided pleural effusion with adjacent atelectasis. A superimposed infectious process cannot be excluded. 2. Trace to small left-sided pleural effusion. New retrocardiac opacity which may represent atelectasis or infiltrate. 3. Mild cardiomegaly. There is evidence of generalized volume overload. Electronically Signed   By: CConstance HolsterM.D.   On: 03/25/2019 20:55    Lab Data:  CBC: Recent Labs  Lab 03/25/19 2033 03/26/19 0544 03/27/19 0341  WBC 9.3 8.7 7.2  NEUTROABS 6.2  --   --   HGB 12.7 10.9* 10.3*  HCT 43.2 35.4* 33.4*  MCV 97.7  92.2 92.5  PLT 285 237 419   Basic Metabolic Panel: Recent Labs  Lab 03/25/19 2033 03/27/19 0341 03/27/19 0713 03/27/19 1322  NA 142 140  --   --   K 2.9* 2.4*  --  3.1*  CL 95* 96*  --   --   CO2 24 34*  --   --   GLUCOSE 230* 122*  --   --   BUN 21 24*  --   --   CREATININE 1.21* 0.92  --   --   CALCIUM 8.6* 8.0*  --   --   MG 2.1  --  1.8  --    GFR: Estimated Creatinine Clearance: 46.2 mL/min (by C-G formula based on SCr of 0.92 mg/dL). Liver Function Tests: No results for input(s): AST, ALT, ALKPHOS, BILITOT, PROT, ALBUMIN in the last 168 hours. No results for input(s): LIPASE, AMYLASE in the last 168 hours. No results for input(s): AMMONIA in the last 168 hours. Coagulation Profile: No results for input(s): INR, PROTIME in the last 168 hours. Cardiac Enzymes: Recent Labs  Lab 03/25/19 2033 03/25/19 2303 03/26/19 0932 03/26/19 1458 03/26/19 2054  TROPONINI 0.14* 0.11* 0.15* 0.15* 0.14*   BNP (last 3 results) No results for input(s): PROBNP in the last 8760 hours. HbA1C: Recent Labs    03/26/19 0224  HGBA1C 5.4   CBG: No results for input(s): GLUCAP in the last 168 hours. Lipid Profile: Recent Labs    03/26/19 0224  CHOL 89  HDL 33*  LDLCALC 43  TRIG 65  CHOLHDL 2.7   Thyroid Function Tests: Recent Labs    03/25/19 2033  TSH 2.216   Anemia Panel: No results for input(s): VITAMINB12, FOLATE, FERRITIN, TIBC, IRON, RETICCTPCT in the last 72 hours. Urine analysis:    Component Value Date/Time   COLORURINE YELLOW 01/14/2019 0226   APPEARANCEUR CLEAR 01/14/2019 0226   LABSPEC 1.012 01/14/2019 0226   PHURINE 7.0 01/14/2019 0226   GLUCOSEU NEGATIVE 01/14/2019 0226   HGBUR NEGATIVE 01/14/2019 0226   BILIRUBINUR NEGATIVE 01/14/2019 0226   KETONESUR NEGATIVE 01/14/2019 0226   PROTEINUR NEGATIVE 01/14/2019 0226   NITRITE NEGATIVE 01/14/2019 0226   LEUKOCYTESUR NEGATIVE 01/14/2019 0226     Ripudeep Rai M.D. [facility] 03/27/2019, 3:17  PM  Pager: [phone] Between 7am to 7pm - call Pager - [phone]  After 7pm go to [email or web address] - password [email or web address]  Call night coverage person covering after 7pm

## 2019-03-27 NOTE — Evaluation (Signed)
Occupational Therapy Evaluation Patient Details Name: Misty [name] MRN: [id number] DOB: March 11, 1941 Today's Date: 03/27/2019    History of Present Illness 78 y.o. female admitted with dyspnea for 1 weeks with PNA and aFib. PMHx: COPD, nicotine dep,  GERD, Hyperlipidemia, Migraines, Anxiety, left hip ORIF 01/07/19, colitis   Clinical Impression   PTA, pt was living with her husband and was performing BADLs and used RW for functional mobility. Pt currently requiring Min A for UB ADLs, Mod A for LB ADLs, and Min A for functional mobility with RW. Pt presenting with poor activity tolerance as seen by quick fatigue, requiring frequent rest breaks, and Spo2 dropping to 80's on 2L; elevated to 4L O2 and pt maintained 94-92% SpO2. Pt presenting with significant posterior lean during LB ADLs. Pt would benefit from further acute OT to facilitate safe dc. Due to poor activity tolerance and balance, ecommend dc to SNF for further OT to optimize safety, independence with ADLs, and return to PLOF. However, pt may progress to home with HHOT.     Follow Up Recommendations  SNF;Supervision/Assistance - 24 hour(May progress to home with [facility])    Equipment Recommendations  None recommended by OT    Recommendations for Other Services PT consult     Precautions / Restrictions Precautions Precautions: Fall      Mobility Bed Mobility Overal bed mobility: Needs Assistance Bed Mobility: Supine to Sit     Supine to sit: Min assist     General bed mobility comments: Min A to initating bring BLEs towards EOB. Pt then using OT's hand to pull unto upright position  Transfers Overall transfer level: Needs assistance Equipment used: Rolling walker (2 wheeled) Transfers: Sit to/from Stand Sit to Stand: Min assist;From elevated surface         General transfer comment: Min A to power up into standing    Balance Overall balance assessment: Needs assistance Sitting-balance support: No upper extremity  supported;Feet supported Sitting balance-Leahy Scale: Fair     Standing balance support: Bilateral upper extremity supported Standing balance-Leahy Scale: Poor Standing balance comment: Reliant on UE support and physical A                           ADL either performed or assessed with clinical judgement   ADL Overall ADL's : Needs assistance/impaired Eating/Feeding: Supervision/ safety;Set up;Sitting Eating/Feeding Details (indicate cue type and reason): Reporting that she "choked" on water prior to OT entering room and not has random burps. RN aware Grooming: Oral care;Sitting;Supervision/safety;Set up Grooming Details (indicate cue type and reason): Pt requiring increased time and rest breaks during ADLs due to significant fatigue. Upper Body Bathing: Minimal assistance;Sitting   Lower Body Bathing: Moderate assistance;Sit to/from stand Lower Body Bathing Details (indicate cue type and reason): Pt performing LB bathing to clean peri are after urinary incontience in bed. Pt requiring Mod A for stanidng balance and posterior lean.  Upper Body Dressing : Minimal assistance;Sitting   Lower Body Dressing: Moderate assistance;Sit to/from stand Lower Body Dressing Details (indicate cue type and reason): Pt requiring Mod A to doffing soiled underwear and then donning new underwear. Pt presenting with poor activity tolerance and balance throughout task Toilet Transfer: Minimal assistance;Ambulation;BSC;RW           Functional mobility during ADLs: Minimal assistance;Rolling walker General ADL Comments: Pt with decreased balance and activity tolerance impacting safety and functional performance     Vision  Perception     Praxis      Pertinent Vitals/Pain Pain Assessment: Faces Faces Pain Scale: Hurts little more Pain Location: back Pain Descriptors / Indicators: Aching;Discomfort Pain Intervention(s): Monitored during session;Limited activity within patient's  tolerance;Repositioned     Hand Dominance Right   Extremity/Trunk Assessment Upper Extremity Assessment Upper Extremity Assessment: Generalized weakness   Lower Extremity Assessment Lower Extremity Assessment: Generalized weakness   Cervical / Trunk Assessment Cervical / Trunk Assessment: Kyphotic   Communication Communication Communication: No difficulties   Cognition Arousal/Alertness: Awake/alert Behavior During Therapy: Anxious Overall Cognitive Status: Within Functional Limits for tasks assessed                                     General Comments  SpO2 dropping to 80s on 2L. Elevated to 4L O2 and pt maintained 92-94%. HR elevated to 103 during activity    Exercises     Shoulder Instructions      Home Living Family/patient expects to be discharged to:: Private residence Living Arrangements: Spouse/significant other Available Help at Discharge: Family;Available 24 hours/day Type of Home: House Home Access: Ramped entrance     Home Layout: One level     Bathroom Shower/Tub: Chief Strategy Officer: Handicapped height     Home Equipment: Cane - single point;Walker - 2 wheels;Shower seat;Wheelchair - manual;Grab bars - tub/shower;Grab bars - toilet          Prior Functioning/Environment Level of Independence: Needs assistance  Gait / Transfers Assistance Needed: walks about 44' with RW at home, walks very short distances without RW ADL's / Homemaking Assistance Needed: sits down to sponge bathe with spouse assist for back and for lower body dressing at times. Does some cooking            OT Problem List: Decreased strength;Decreased range of motion;Decreased activity tolerance;Impaired balance (sitting and/or standing);Decreased knowledge of use of DME or AE;Decreased knowledge of precautions;Pain;Cardiopulmonary status limiting activity      OT Treatment/Interventions: Self-care/ADL training;Therapeutic exercise;Energy  conservation;DME and/or AE instruction;Therapeutic activities;Patient/family education    OT Goals(Current goals can be found in the care plan section) Acute Rehab OT Goals Patient Stated Goal: "I want to know why I am so tired" OT Goal Formulation: With patient Time For Goal Achievement: 04/10/19 Potential to Achieve Goals: Good  OT Frequency: Min 2X/week   Barriers to D/C:            Co-evaluation              AM-PAC OT "6 Clicks" Daily Activity     Outcome Measure Help from another person eating meals?: None Help from another person taking care of personal grooming?: A Little Help from another person toileting, which includes using toliet, bedpan, or urinal?: A Lot Help from another person bathing (including washing, rinsing, drying)?: A Lot Help from another person to put on and taking off regular upper body clothing?: A Little Help from another person to put on and taking off regular lower body clothing?: A Lot 6 Click Score: 16   End of Session Equipment Utilized During Treatment: Rolling walker;Gait belt;Oxygen(4L) Nurse Communication: Mobility status;Other (comment)(SpO2. Pt would like RN to update daughter)  Activity Tolerance: Patient limited by fatigue Patient left: in bed;with call bell/phone within reach;with nursing/sitter in room  OT Visit Diagnosis: Unsteadiness on feet (R26.81);Other abnormalities of gait and mobility (R26.89);Muscle weakness (generalized) (M62.81);Other symptoms and signs  involving cognitive function                Time: 4010-2725 OT Time Calculation (min): 24 min Charges:  OT General Charges $OT Visit: 1 Visit OT Evaluation $OT Eval Moderate Complexity: 1 Mod OT Treatments $Self Care/Home Management : 8-22 mins  Xavia Kniskern MSOT, OTR/L Acute Rehab Pager: [phone] Office: [phone]  Theodoro Grist Uriyah Raska 03/27/2019, 1:04 PM

## 2019-03-27 NOTE — Progress Notes (Signed)
Critical Potassium result  2.4 paged to [employer]

## 2019-03-28 ENCOUNTER — Inpatient Hospital Stay (HOSPITAL_COMMUNITY): Payer: Medicare Other

## 2019-03-28 ENCOUNTER — Other Ambulatory Visit: Payer: Self-pay | Admitting: Radiology

## 2019-03-28 LAB — CBC
HCT: 34.1 % — ABNORMAL LOW (ref 36.0–46.0)
Hemoglobin: 10.1 g/dL — ABNORMAL LOW (ref 12.0–15.0)
MCH: 28.3 pg (ref 26.0–34.0)
MCHC: 29.6 g/dL — ABNORMAL LOW (ref 30.0–36.0)
MCV: 95.5 fL (ref 80.0–100.0)
Platelets: 194 10*3/uL (ref 150–400)
RBC: 3.57 MIL/uL — ABNORMAL LOW (ref 3.87–5.11)
RDW: 17.2 % — ABNORMAL HIGH (ref 11.5–15.5)
WBC: 8.3 10*3/uL (ref 4.0–10.5)
nRBC: 0 % (ref 0.0–0.2)

## 2019-03-28 LAB — BASIC METABOLIC PANEL
Anion gap: 11 (ref 5–15)
BUN: 21 mg/dL (ref 8–23)
CO2: 36 mmol/L — ABNORMAL HIGH (ref 22–32)
Calcium: 8.4 mg/dL — ABNORMAL LOW (ref 8.9–10.3)
Chloride: 91 mmol/L — ABNORMAL LOW (ref 98–111)
Creatinine, Ser: 1.01 mg/dL — ABNORMAL HIGH (ref 0.44–1.00)
GFR calc Af Amer: >60 mL/min (ref >60)
GFR calc non Af Amer: 54 mL/min — ABNORMAL LOW (ref >60)
Glucose, Bld: 120 mg/dL — ABNORMAL HIGH (ref 70–99)
Potassium: 3.6 mmol/L (ref 3.5–5.1)
Sodium: 138 mmol/L (ref 135–145)

## 2019-03-28 LAB — MRSA PCR SCREENING: MRSA by PCR: NEGATIVE

## 2019-03-28 LAB — GLUCOSE, PLEURAL OR PERITONEAL FLUID: Glucose, Fluid: 126 mg/dL

## 2019-03-28 LAB — ALBUMIN, PLEURAL OR PERITONEAL FLUID: Albumin, Fluid: <1 g/dL

## 2019-03-28 LAB — PROTEIN, PLEURAL OR PERITONEAL FLUID: Total protein, fluid: <3 g/dL

## 2019-03-28 LAB — HEPARIN LEVEL (UNFRACTIONATED): Heparin Unfractionated: 0.36 IU/mL (ref 0.30–0.70)

## 2019-03-28 LAB — GRAM STAIN

## 2019-03-28 LAB — STREP PNEUMONIAE URINARY ANTIGEN: Strep Pneumo Urinary Antigen: POSITIVE — AB

## 2019-03-28 LAB — BODY FLUID CELL COUNT WITH DIFFERENTIAL
Lymphs, Fluid: 46 %
Monocyte-Macrophage-Serous Fluid: 19 % — ABNORMAL LOW (ref 50–90)
Neutrophil Count, Fluid: 35 % — ABNORMAL HIGH (ref 0–25)
Total Nucleated Cell Count, Fluid: 99 cu mm (ref 0–1000)

## 2019-03-28 LAB — LACTATE DEHYDROGENASE, PLEURAL OR PERITONEAL FLUID: LD, Fluid: 56 U/L — ABNORMAL HIGH (ref 3–23)

## 2019-03-28 MED ORDER — LIDOCAINE HCL (PF) 1 % IJ SOLN
INTRAMUSCULAR | Status: AC
  Filled 2019-03-28: qty 30

## 2019-03-28 MED ORDER — SODIUM CHLORIDE 0.9 % IV SOLN
2.0000 g | INTRAVENOUS | Status: DC
  Administered 2019-03-28 – 2019-03-31 (×4): 2 g via INTRAVENOUS
  Filled 2019-03-28 (×4): qty 20

## 2019-03-28 MED ORDER — LIDOCAINE 5 % EX PTCH
1.0000 | MEDICATED_PATCH | CUTANEOUS | Status: DC
  Administered 2019-03-28: 1 via TRANSDERMAL
  Filled 2019-03-28 (×3): qty 1

## 2019-03-28 MED ORDER — BISOPROLOL FUMARATE 5 MG PO TABS
5.0000 mg | ORAL_TABLET | Freq: Every day | ORAL | Status: DC
  Administered 2019-03-28 – 2019-04-02 (×6): 5 mg via ORAL
  Filled 2019-03-28 (×6): qty 1

## 2019-03-28 NOTE — Progress Notes (Signed)
Progress Note  Patient Name: Misty [name] Date of Encounter: 03/28/2019  Primary Cardiologist: Mertie Moores, MD  Subjective   Complains of shortness of breath at rest, no chest pain or palpitations.  No abdominal pain or emesis.  Inpatient Medications    Scheduled Meds: . ALPRAZolam  0.5 mg Oral TID  . bisoprolol  2.5 mg Oral Daily  . diltiazem  10 mg Intravenous Once  . docusate sodium  200 mg Oral BID  . escitalopram  20 mg Oral QHS  . feeding supplement (ENSURE ENLIVE)  237 mL Oral BID BM  . ferrous [id number] C-folic acid  1 capsule Oral QAC breakfast  . multivitamin with minerals  1 tablet Oral Daily  . nystatin-triamcinolone ointment  1 application Topical BID  . pantoprazole  40 mg Oral BID AC  . sodium chloride flush  3 mL Intravenous Q12H  . vitamin B-12  1,000 mcg Oral Daily   Continuous Infusions: . sodium chloride    . ceFEPime (MAXIPIME) IV Stopped (03/27/19 2230)  . diltiazem (CARDIZEM) infusion Stopped (03/27/19 1103)  . heparin 900 Units/hr (03/27/19 2327)  . vancomycin Stopped (03/27/19 2130)   PRN Meds: sodium chloride, acetaminophen **OR** acetaminophen, albuterol, ipratropium-albuterol, magic mouthwash w/lidocaine, sodium chloride flush   Vital Signs    Vitals:   03/27/19 1425 03/27/19 2047 03/28/19 0017 03/28/19 0441  BP: (!) 83/72 (!) 88/58 98/70 104/74  Pulse: 90 70 81 75  Resp: 20 (!) 26 19 (!) 31  Temp: 97.6 F (36.4 C) (!) 97.5 F (36.4 C) (!) 97.5 F (36.4 C)   TempSrc: Axillary Oral Axillary   SpO2: 98% 100% 100% 100%  Weight:      Height:        Intake/Output Summary (Last 24 hours) at 03/28/2019 0725 Last data filed at 03/28/2019 0442 Gross per 24 hour  Intake 1023 ml  Output 950 ml  Net 73 ml   Filed Weights   03/25/19 2018 03/27/19 0456  Weight: 56.7 kg 57.2 kg    Telemetry    Currently sinus rhythm.  Personally reviewed.  ECG    Tracing from 03/25/2019 shows possible atrial flutter with 2:1 block.  personally reviewed.  Physical Exam   GEN:  Elderly woman.  No acute distress.   Neck: No JVD. Cardiac: RRR, no gallop.  Respiratory: Nonlabored.  Decreased breath sounds right base.Marland Kitchen GI: Soft, nontender, bowel sounds present. MS: No edema; No deformity. Neuro:  Nonfocal. Psych: Alert and oriented x 3. Normal affect.  Labs    Chemistry Recent Labs  Lab 03/25/19 2033 03/27/19 0341 03/27/19 1322 03/28/19 0523  NA 142 140  --  138  K 2.9* 2.4* 3.1* 3.6  CL 95* 96*  --  91*  CO2 24 34*  --  36*  GLUCOSE 230* 122*  --  120*  BUN 21 24*  --  21  CREATININE 1.21* 0.92  --  1.01*  CALCIUM 8.6* 8.0*  --  8.4*  GFRNONAA 43* >60  --  54*  GFRAA 50* >60  --  >60  ANIONGAP >20* 10  --  11     Hematology Recent Labs  Lab 03/26/19 0544 03/27/19 0341 03/28/19 0523  WBC 8.7 7.2 8.3  RBC 3.84* 3.61* 3.57*  HGB 10.9* 10.3* 10.1*  HCT 35.4* 33.4* 34.1*  MCV 92.2 92.5 95.5  MCH 28.4 28.5 28.3  MCHC 30.8 30.8 29.6*  RDW 16.7* 16.9* 17.2*  PLT 237 201 194    Cardiac Enzymes Recent Labs  Lab 03/25/19 2303 03/26/19 0932 03/26/19 1458 03/26/19 2054  TROPONINI 0.11* 0.15* 0.15* 0.14*   No results for input(s): TROPIPOC in the last 168 hours.   BNP Recent Labs  Lab 03/25/19 2033  BNP >4,500.0*     DDimer  Recent Labs  Lab 03/25/19 2122  DDIMER 1.69*     Radiology    Ct Angio Chest Pe W Or Wo Contrast  Result Date: 03/27/2019 CLINICAL DATA:  Dyspnea. EXAM: CT ANGIOGRAPHY CHEST WITH CONTRAST TECHNIQUE: Multidetector CT imaging of the chest was performed using the standard protocol during bolus administration of intravenous contrast. Multiplanar CT image reconstructions and MIPs were obtained to evaluate the vascular anatomy. CONTRAST:  25m OMNIPAQUE IOHEXOL 350 MG/ML SOLN COMPARISON:  Radiograph of Mar 25, 2019.  CT scan of March 05, 2018. FINDINGS: Cardiovascular: Satisfactory opacification of the pulmonary arteries to the segmental level. No evidence of pulmonary  embolism. Mild cardiomegaly is noted. No pericardial effusion. Atherosclerosis of thoracic aorta is noted without aneurysm formation. Mediastinum/Nodes: No enlarged mediastinal, hilar, or axillary lymph nodes. Thyroid gland, trachea, and esophagus demonstrate no significant findings. Lungs/Pleura: No pneumothorax is noted. Large right pleural effusion is noted with associated atelectasis of the right lower lobe. Minimal left pleural effusion is noted with adjacent subsegmental atelectasis. Upper Abdomen: No acute abnormality. Musculoskeletal: Status post kyphoplasty at multiple levels in the lower thoracic spine. New mild compression deformity of T10 vertebral body is noted of indeterminate age. Review of the MIP images confirms the above findings. IMPRESSION: No definite evidence of pulmonary embolus. Large right pleural effusion is noted with associated atelectasis of right lower lobe. New mild compression deformity of T10 vertebral body is noted of indeterminate age. Aortic Atherosclerosis (ICD10-I70.0). Electronically Signed   By: JMarijo ConceptionM.D.   On: 03/27/2019 13:14    Cardiac Studies   Echocardiogram 03/26/2019: 1. The left ventricle has moderate-severely reduced systolic function, with an ejection fraction of 30-35%. The cavity size was normal. There is mildly increased left ventricular wall thickness. Left ventricular diastolic function could not be evaluated secondary to atrial fibrillation. There is pre-excitation of the interventricular septum. Left ventricular diffuse hypokinesis. 2. The right ventricle has mildly reduced systolic function. The cavity was mildly enlarged. There is no increase in right ventricular wall thickness. 3. Left atrial size was moderately dilated. 4. Right atrial size was mildly dilated. 5. Trivial pericardial effusion is present. 6. The mitral valve is abnormal. Mild thickening of the mitral valve leaflet. Mitral valve regurgitation is moderate by color  flow Doppler. 7. The tricuspid valve was grossly normal. 8. The aortic valve is tricuspid Mild sclerosis of the aortic valve. Aortic valve regurgitation is trivial by color flow Doppler. No stenosis of the aortic valve. 9. The inferior vena cava was dilated in size with <50% respiratory variability.  Patient Profile     78y.o. female with a history of COPD and tobacco abuse, hyperlipidemia, chronic back pain, admitted with pneumonia and newly documented rapid atrial fibrillation/flutter.  Assessment & Plan    1.  Atrial fibrillation/flutter, newly documented and presenting with RVR.  CHADSVASC score is 5.  She has converted spontaneously to sinus rhythm and is now off intravenous diltiazem.  She continues on intravenous heparin.  2.  Newly documented cardiomyopathy with LVEF 30 to 35% and diffuse hypokinesis.  She also has mildly reduced right ventricular contraction.  3.  Mild troponin I elevation and relatively flat pattern around 0.15, most indicative of demand ischemia in the setting of  above.  She does have atherosclerosis documented by previous CT imaging.  4..  Community-acquired pneumonia.  SARS coronavirus 2 negative.  Continues on antibiotics per primary team.  5.  COPD.  6.  Hypokalemia, improving.  Patient has been weaned off of intravenous diltiazem and is now in sinus rhythm having spontaneously converted.  Increase bisoprolol to 5 mg daily.  Blood pressure still too low to add ARB.  Allergies are reported to aspirin and statin.  Hold off on Aldactone for now with low blood pressures.  She is scheduled per primary team to undergo thoracentesis today of right pleural effusion.  From a cardiac perspective would ultimately anticipate right and left heart catheterization and possibly input from the Heart Failure team depending on her clinical course and further stabilization from pulmonary process.  Once invasive testing is complete she can be considered for initiation of DOAC.   Signed, Rozann Lesches, MD  03/28/2019, 7:25 AM

## 2019-03-28 NOTE — Procedures (Signed)
Ultrasound-guided diagnostic and therapeutic right thoracentesis performed yielding 900 cc of yellow  fluid. No immediate complications. Follow-up chest x-ray pending. The fluid was sent to the lab for preordered studies. EBL none.

## 2019-03-28 NOTE — Progress Notes (Signed)
[facility]                                                                              Patient Demographics  Misty [name], is a 78 y.o. female, DOB - 1940/12/24, NWG:[id number]  Admit date - 03/25/2019   Admitting Physician Pearson Grippe, MD  Outpatient Primary MD for the patient is Ernestina Penna, MD  Outpatient specialists:   LOS - 3  days   Medical records reviewed and are as summarized below:    Chief Complaint  Patient presents with   Shortness of Breath       Brief summary   Patient is a 78 year old female with COPD, nicotine dependence, hyperlipidemia, GERD, anxiety presented with dyspnea for past 1 week.  Patient reported slight cough, slight palpitations.  Otherwise denied fever chills chest pain nausea vomiting.  In ED, patient was noted to be in rapid atrial fibrillation with heart rate in 130s -140s.  EKG showed heart rate of 140, LAD, Q waves in V1 to V3 Chest x-ray showed interval development of right lower lung zone opacity favored heart rate size right-sided pleural effusion, chest atelectasis, superimposed infectious process cannot be excluded, trace to small left-sided pleural effusion.  New retrocardiac opacity may represent atelectasis or infiltrates.  Patient was started on Cardizem drip in ED. K 2.9, BNP> 4500  COVID negative  Assessment & Plan    Principal Problem:   Atrial fibrillation with RVR (HCC)-new onset -States overnight had a rough night, felt short of breath and was given Lasix 20 mg IV x1. -At the time of my examination, was feeling a little better with breathing. -Continue beta-blocker, weaned off of IV diltiazem.  Patient now in sinus rhythm. -Troponin elevated 0.14-> 0.11, on heparin drip -BNP> 4500, d-dimer 1.69 -CT angiogram negative for PE.  Large right pleural effusion with associated atelectasis of the right lower lobe. -TSH 2.2 2D echo showed EF of 30 to 35%, in A. fib, diffuse hypokinesis.   -2D echo should  be repeated when converted to normal sinus rhythm or when heart rate is controlled.  Active Problems: COPD, community-acquired pneumonia -Urine strep pneumo positive, COVID negative, urine Legionella antigen pending  -Blood cultures negative so far -DC vancomycin and cefepime, MRSA swab negative -Antibiotics narrowed to IV Rocephin  Right sided large pleural effusion -Likely precipitated due to #1 and pneumonia -CT scan shows large right-sided pleural effusion, currently on heparin drip IR was consulted, patient underwent ultrasound-guided diagnostic and therapeutic thoracentesis on the right yielding 900 cc of yellow fluid, follow studies   Elevated troponin -Likely due to #1, currently on heparin drip, cardiology consulted    GERD (gastroesophageal reflux disease) -Continue PPI    Malnutrition of moderate degree -Placed on nutritional supplements    Hypokalemia -Resolved    Anxiety and depression -Continue Xanax  History of iron deficiency anemia -Hemoglobin stable, at baseline, follow closely  Lactic acidosis -Improving   Code Status: Full code DVT Prophylaxis: Heparin drip Family Communication: Discussed in detail with the patient, all imaging results, lab results explained to the patient    Disposition Plan: Not medically ready, cardiology following, underwent thoracentesis  today  Time Spent in minutes   35 minutes  Procedures:  2D echo   Consultants:   Cardiology  Antimicrobials:   Anti-infectives (From admission, onward)   Start     Dose/Rate Route Frequency Ordered Stop   03/28/19 1000  cefTRIAXone (ROCEPHIN) 2 g in sodium chloride 0.9 % 100 mL IVPB     2 g 200 mL/hr over 30 Minutes Intravenous Every 24 hours 03/28/19 0847     03/27/19 2000  vancomycin (VANCOCIN) IVPB 1000 mg/200 mL premix  Status:  Discontinued     1,000 mg 200 mL/hr over 60 Minutes Intravenous Every 36 hours 03/26/19 1029 03/28/19 0847   03/27/19 1000  ceFEPIme (MAXIPIME) 1 g in  sodium chloride 0.9 % 100 mL IVPB  Status:  Discontinued     1 g 200 mL/hr over 30 Minutes Intravenous Every 24 hours 03/26/19 1029 03/27/19 0912   03/27/19 1000  ceFEPIme (MAXIPIME) 2 g in sodium chloride 0.9 % 100 mL IVPB  Status:  Discontinued     2 g 200 mL/hr over 30 Minutes Intravenous Every 12 hours 03/27/19 0912 03/28/19 0847   03/26/19 0115  vancomycin (VANCOCIN) IVPB 1000 mg/200 mL premix     1,000 mg 200 mL/hr over 60 Minutes Intravenous  Once 03/26/19 0101 03/26/19 0915   03/26/19 0115  ceFEPIme (MAXIPIME) 2 g in sodium chloride 0.9 % 100 mL IVPB     2 g 200 mL/hr over 30 Minutes Intravenous  Once 03/26/19 0101 03/26/19 1045   03/25/19 2230  cefTRIAXone (ROCEPHIN) 1 g in sodium chloride 0.9 % 100 mL IVPB     1 g 200 mL/hr over 30 Minutes Intravenous  Once 03/25/19 2225 03/25/19 2318   03/25/19 2230  doxycycline (VIBRA-TABS) tablet 100 mg     100 mg Oral  Once 03/25/19 2225 03/25/19 2234         Medications  Scheduled Meds:  ALPRAZolam  0.5 mg Oral TID   bisoprolol  5 mg Oral Daily   docusate sodium  200 mg Oral BID   escitalopram  20 mg Oral QHS   feeding supplement (ENSURE ENLIVE)  237 mL Oral BID BM   ferrous fumarate-b12-vitamic C-folic acid  1 capsule Oral QAC breakfast   lidocaine (PF)       multivitamin with minerals  1 tablet Oral Daily   nystatin-triamcinolone ointment  1 application Topical BID   pantoprazole  40 mg Oral BID AC   sodium chloride flush  3 mL Intravenous Q12H   vitamin B-12  1,000 mcg Oral Daily   Continuous Infusions:  sodium chloride     cefTRIAXone (ROCEPHIN)  IV 2 g (03/28/19 1234)   heparin 900 Units/hr (03/28/19 1213)   PRN Meds:.sodium chloride, acetaminophen **OR** acetaminophen, albuterol, ipratropium-albuterol, magic mouthwash w/lidocaine, sodium chloride flush      Subjective:   Misty [name] was seen and examined today.  Overnight had a rough night due to shortness of breath.  Feeling somewhat better  today.  No abdominal pain, nausea, vomiting, diarrhea or constipation.  Objective:   Vitals:   03/28/19 0758 03/28/19 1059 03/28/19 1136 03/28/19 1153  BP:  109/79 100/66 104/75  Pulse: 79   84  Resp: 19   (!) 21  Temp:    97.8 F (36.6 C)  TempSrc:    Oral  SpO2: 99%   96%  Weight:      Height:        Intake/Output Summary (Last 24 hours) at 03/28/2019 1458  Last data filed at 03/28/2019 1214 Gross per 24 hour  Intake 783 ml  Output 1225 ml  Net -442 ml     Wt Readings from Last 3 Encounters:  03/27/19 57.2 kg  02/24/19 57.6 kg  01/28/19 57.6 kg   Physical Exam  General: Alert and oriented x 3, NAD  Eyes:   HEENT:  Atraumatic, normocephalic  Cardiovascular: S1 S2 clear, no murmurs, RRR. No pedal edema b/l  Respiratory: Decreased breath sound at the bases  Gastrointestinal: Soft, nontender, nondistended, NBS  Ext: no pedal edema bilaterally  Neuro: no new deficits  Musculoskeletal: No cyanosis, clubbing  Skin: No rashes  Psych: Normal affect and demeanor, alert and oriented x3     Data Reviewed:  I have personally reviewed following labs and imaging studies  Micro Results Recent Results (from the past 240 hour(s))  SARS Coronavirus 2 (CEPHEID- Performed in [facility] hospital lab), Hosp Order     Status: None   Collection Time: 03/25/19  8:58 PM  Result Value Ref Range Status   SARS Coronavirus 2 NEGATIVE NEGATIVE Final    Comment: (NOTE) If result is NEGATIVE SARS-CoV-2 target nucleic acids are NOT DETECTED. The SARS-CoV-2 RNA is generally detectable in upper and lower  respiratory specimens during the acute phase of infection. The lowest  concentration of SARS-CoV-2 viral copies this assay can detect is 250  copies / mL. A negative result does not preclude SARS-CoV-2 infection  and should not be used as the sole basis for treatment or other  patient management decisions.  A negative result may occur with  improper specimen collection /  handling, submission of specimen other  than nasopharyngeal swab, presence of viral mutation(s) within the  areas targeted by this assay, and inadequate number of viral copies  (<250 copies / mL). A negative result must be combined with clinical  observations, patient history, and epidemiological information. If result is POSITIVE SARS-CoV-2 target nucleic acids are DETECTED. The SARS-CoV-2 RNA is generally detectable in upper and lower  respiratory specimens dur ing the acute phase of infection.  Positive  results are indicative of active infection with SARS-CoV-2.  Clinical  correlation with patient history and other diagnostic information is  necessary to determine patient infection status.  Positive results do  not rule out bacterial infection or co-infection with other viruses. If result is PRESUMPTIVE POSTIVE SARS-CoV-2 nucleic acids MAY BE PRESENT.   A presumptive positive result was obtained on the submitted specimen  and confirmed on repeat testing.  While 2019 novel coronavirus  (SARS-CoV-2) nucleic acids may be present in the submitted sample  additional confirmatory testing may be necessary for epidemiological  and / or clinical management purposes  to differentiate between  SARS-CoV-2 and other Sarbecovirus currently known to infect humans.  If clinically indicated additional testing with an alternate test  methodology [phone]) is advised. The SARS-CoV-2 RNA is generally  detectable in upper and lower respiratory sp ecimens during the acute  phase of infection. The expected result is Negative. Fact Sheet for Patients:  BoilerBrush.com.cy Fact Sheet for Healthcare Providers: https://pope.com/ This test is not yet approved or cleared by the [place] FDA and has been authorized for detection and/or diagnosis of SARS-CoV-2 by FDA under an Emergency Use Authorization (EUA).  This EUA will remain in effect (meaning this  test can be used) for the duration of the COVID-19 declaration under Section 564(b)(1) of the Act, 21 U.S.C. section 360bbb-3(b)(1), unless the authorization is terminated or revoked sooner. Performed  at [facility], [address], [place], [place] [address]   Culture, blood (routine x 2)     Status: None (Preliminary result)   Collection Time: 03/25/19  9:22 PM  Result Value Ref Range Status   Specimen Description BLOOD LEFT ANTECUBITAL  Final   Special Requests   Final    BOTTLES DRAWN AEROBIC AND ANAEROBIC Blood Culture adequate volume   Culture   Final    NO GROWTH 3 DAYS Performed at [facility], [address], [place], [place] [address]    Report Status PENDING  Incomplete  Culture, blood (routine x 2)     Status: None (Preliminary result)   Collection Time: 03/25/19  9:23 PM  Result Value Ref Range Status   Specimen Description BLOOD BLOOD LEFT WRIST  Final   Special Requests   Final    BOTTLES DRAWN AEROBIC AND ANAEROBIC Blood Culture adequate volume   Culture   Final    NO GROWTH 3 DAYS Performed at [facility], [address], [place], [place] [address]    Report Status PENDING  Incomplete  MRSA PCR Screening     Status: None   Collection Time: 03/27/19 11:43 PM  Result Value Ref Range Status   MRSA by PCR NEGATIVE NEGATIVE Final    Comment:        The GeneXpert MRSA Assay (FDA approved for NASAL specimens only), is one component of a comprehensive MRSA colonization surveillance program. It is not intended to diagnose MRSA infection nor to guide or monitor treatment for MRSA infections. Performed at [facility], [address] [place], [place] [address]     Radiology Reports Dg Chest 1 View  Result Date: 03/28/2019 CLINICAL DATA:  Status post right thoracentesis EXAM: CHEST  1 VIEW COMPARISON:  03/25/2019, 03/27/2019 FINDINGS: Near complete resolution of the right effusion following thoracentesis. Improved aeration of the right lower lobe. No  pneumothorax. Small left effusion remains with left basilar partial collapse/consolidation. Stable cardiomegaly. Background parenchymal scarring, suspect COPD/emphysema. Trachea is midline. Aorta atherosclerotic. Bones are osteopenic. Remote vertebral augmentation changes. IMPRESSION: Near complete resolution of right effusion following thoracentesis. Negative for pneumothorax. Other findings as above. Electronically Signed   By: Judie Petit.  Shick M.D.   On: 03/28/2019 11:53   Ct Angio Chest Pe W Or Wo Contrast  Result Date: 03/27/2019 CLINICAL DATA:  Dyspnea. EXAM: CT ANGIOGRAPHY CHEST WITH CONTRAST TECHNIQUE: Multidetector CT imaging of the chest was performed using the standard protocol during bolus administration of intravenous contrast. Multiplanar CT image reconstructions and MIPs were obtained to evaluate the vascular anatomy. CONTRAST:  75mL OMNIPAQUE IOHEXOL 350 MG/ML SOLN COMPARISON:  Radiograph of Mar 25, 2019.  CT scan of March 05, 2018. FINDINGS: Cardiovascular: Satisfactory opacification of the pulmonary arteries to the segmental level. No evidence of pulmonary embolism. Mild cardiomegaly is noted. No pericardial effusion. Atherosclerosis of thoracic aorta is noted without aneurysm formation. Mediastinum/Nodes: No enlarged mediastinal, hilar, or axillary lymph nodes. Thyroid gland, trachea, and esophagus demonstrate no significant findings. Lungs/Pleura: No pneumothorax is noted. Large right pleural effusion is noted with associated atelectasis of the right lower lobe. Minimal left pleural effusion is noted with adjacent subsegmental atelectasis. Upper Abdomen: No acute abnormality. Musculoskeletal: Status post kyphoplasty at multiple levels in the lower thoracic spine. New mild compression deformity of T10 vertebral body is noted of indeterminate age. Review of the MIP images confirms the above findings. IMPRESSION: No definite evidence of pulmonary embolus. Large right pleural effusion is noted with  associated atelectasis of right lower lobe. New mild compression deformity of T10 vertebral body is noted of indeterminate age. Aortic Atherosclerosis (ICD10-I70.0). Electronically Signed   By: Lupita Raider M.D.   On: 03/27/2019 13:14   Dg Chest Port 1 View  Result Date: 03/25/2019 CLINICAL DATA:  Shortness of breath. EXAM: PORTABLE CHEST 1 VIEW COMPARISON:  01/06/2019 FINDINGS: The cardiac silhouette is enlarged. There is a right lower lung zone airspace opacity favored to represent a combination of a right-sided pleural effusion and atelectasis/consolidation. No pneumothorax. There is a likely a trace left-sided effusion. A retrocardiac opacity is noted. Generalized volume overload is seen. IMPRESSION: 1. Interval development of a right lower lung zone opacity favored to represent a combination of a small to moderate size right-sided pleural effusion with adjacent atelectasis. A superimposed infectious process cannot be excluded. 2. Trace to small left-sided pleural effusion. New retrocardiac opacity which may represent atelectasis or infiltrate. 3. Mild cardiomegaly. There is evidence of generalized volume overload. Electronically Signed   By: Katherine Mantle M.D.   On: 03/25/2019 20:55   US Thoracentesis Asp Pleural Space W/img Guide  Result Date: 03/28/2019 INDICATION: Patient with history of tobacco abuse, COPD, pneumonia, right pleural effusion. Request received for diagnostic and therapeutic right thoracentesis. EXAM: ULTRASOUND GUIDED DIAGNOSTIC AND THERAPEUTIC RIGHT THORACENTESIS MEDICATIONS: None COMPLICATIONS: None immediate. PROCEDURE: An ultrasound guided thoracentesis was thoroughly discussed with the patient and questions answered. The benefits, risks, alternatives and complications were also discussed. The patient understands and wishes to proceed with the procedure. Written consent was obtained. Ultrasound was performed to localize and mark an adequate pocket of fluid in the right  chest. The area was then prepped and draped in the normal sterile fashion. 1% Lidocaine was used for local anesthesia. Under ultrasound guidance a 6 Fr Safe-T-Centesis catheter was introduced. Thoracentesis was performed. The catheter was removed and a dressing applied. FINDINGS: A total of approximately 900 cc of yellow fluid was removed. Samples were sent to the laboratory as requested by the clinical team. IMPRESSION: Successful ultrasound guided diagnostic and therapeutic right thoracentesis yielding 900 cc of pleural fluid. Read by: Jeananne Rama, PA-C Electronically Signed   By: Judie Petit.  Shick M.D.   On: 03/28/2019 11:44    Lab Data:  CBC: Recent Labs  Lab 03/25/19 2033 03/26/19 0544 03/27/19 0341 03/28/19 0523  WBC 9.3 8.7 7.2 8.3  NEUTROABS 6.2  --   --   --   HGB 12.7 10.9* 10.3* 10.1*  HCT 43.2 35.4* 33.4* 34.1*  MCV 97.7 92.2 92.5 95.5  PLT 285 237 201 194   Basic Metabolic Panel: Recent Labs  Lab 03/25/19 2033 03/27/19 0341 03/27/19 0713 03/27/19 1322 03/28/19 0523  NA 142 140  --   --  138  K 2.9* 2.4*  --  3.1* 3.6  CL 95* 96*  --   --  91*  CO2 24 34*  --   --  36*  GLUCOSE 230* 122*  --   --  120*  BUN 21 24*  --   --  21  CREATININE 1.21* 0.92  --   --  1.01*  CALCIUM 8.6* 8.0*  --   --  8.4*  MG 2.1  --  1.8  --   --    GFR: Estimated Creatinine Clearance: 42.1 mL/min (A) (by C-G formula based on SCr of 1.01 mg/dL (H)). Liver Function Tests: No results for input(s): AST, ALT, ALKPHOS, BILITOT, PROT, ALBUMIN in the last 168 hours. No results  for input(s): LIPASE, AMYLASE in the last 168 hours. No results for input(s): AMMONIA in the last 168 hours. Coagulation Profile: No results for input(s): INR, PROTIME in the last 168 hours. Cardiac Enzymes: Recent Labs  Lab 03/25/19 2033 03/25/19 2303 03/26/19 0932 03/26/19 1458 03/26/19 2054  TROPONINI 0.14* 0.11* 0.15* 0.15* 0.14*   BNP (last 3 results) No results for input(s): PROBNP in the last 8760  hours. HbA1C: Recent Labs    03/26/19 0224  HGBA1C 5.4   CBG: No results for input(s): GLUCAP in the last 168 hours. Lipid Profile: Recent Labs    03/26/19 0224  CHOL 89  HDL 33*  LDLCALC 43  TRIG 65  CHOLHDL 2.7   Thyroid Function Tests: Recent Labs    03/25/19 2033  TSH 2.216   Anemia Panel: No results for input(s): VITAMINB12, FOLATE, FERRITIN, TIBC, IRON, RETICCTPCT in the last 72 hours. Urine analysis:    Component Value Date/Time   COLORURINE YELLOW 01/14/2019 0226   APPEARANCEUR CLEAR 01/14/2019 0226   LABSPEC 1.012 01/14/2019 0226   PHURINE 7.0 01/14/2019 0226   GLUCOSEU NEGATIVE 01/14/2019 0226   HGBUR NEGATIVE 01/14/2019 0226   BILIRUBINUR NEGATIVE 01/14/2019 0226   KETONESUR NEGATIVE 01/14/2019 0226   PROTEINUR NEGATIVE 01/14/2019 0226   NITRITE NEGATIVE 01/14/2019 0226   LEUKOCYTESUR NEGATIVE 01/14/2019 0226     Misty [name] M.D. [facility] 03/28/2019, 2:58 PM  Pager: [phone] Between 7am to 7pm - call Pager - [phone]  After 7pm go to [email or web address] - password [email or web address]  Call night coverage person covering after 7pm

## 2019-03-28 NOTE — Progress Notes (Signed)
Pt c/o "I can't breathe" and "I feel like I am choking".  Sats 100% on 2L /[place].  RR 28-30.  Pt does have increased work of breathing, lungs with coarse crackles throughout and congested non productive cough.  Also, pt had 500 cc of urine in purewick cannister at change of shift and none since.  Bodenheimer, NP notified and new orders received.  20 MG IV lasix given and duoneb per orders.  Pt currently resting in bed with cough noted.  She states she feels a little better.  BP 98/70. RR 22. Sat 100% 2L per [place].

## 2019-03-28 NOTE — Progress Notes (Signed)
[place] for Heparin  Indication: atrial fibrillation  Allergies  Allergen Reactions  . Codeine Nausea And Vomiting  . Tramadol Nausea And Vomiting  . Asa [Aspirin] Other (See Comments)    Patient is unaware of allergy  . Azithromycin Other (See Comments)    Patient is unaware of allergy  . Celebrex [Celecoxib] Other (See Comments)    Irritated stomach   . Cymbalta [Duloxetine Hcl] Other (See Comments)    Felt groggy  . Prednisone Rash    She has seen the podiatrist and he had injected cortisone in her feet and she had also taken a peel at home. She had a severe reaction to her face with a rash and had to take Benadryl to resolve the symptom. She actually did get more cortisone shots, but no additional reactions to the shots.  . Vioxx [Rofecoxib] Other (See Comments)    Unknown  . Zelnorm [Tegaserod] Other (See Comments)    Patient is unaware of allergy  . Zocor [Simvastatin] Other (See Comments)    Patient is unaware of allergy    Patient Measurements: Height: 5' 7"  (170.2 cm) Weight: 126 lb 1.7 oz (57.2 kg) IBW/kg (Calculated) : 61.6 Heparin Dosing Weight: HEPARIN DW (KG): 56.7   Vital Signs: Temp: 97.8 F (36.6 C) (05/10 1153) Temp Source: Oral (05/10 1153) BP: 104/75 (05/10 1153) Pulse Rate: 84 (05/10 1153)  Labs: Recent Labs    03/25/19 2033  03/26/19 0544 03/26/19 0932 03/26/19 1120 03/26/19 1458 03/26/19 2054 03/27/19 0341 03/27/19 0713 03/28/19 0523  HGB 12.7  --  10.9*  --   --   --   --  10.3*  --  10.1*  HCT 43.2  --  35.4*  --   --   --   --  33.4*  --  34.1*  PLT 285  --  237  --   --   --   --  201  --  194  HEPARINUNFRC  --    < > 0.32  --  0.37  --   --   --  0.41 0.36  CREATININE 1.21*  --   --   --   --   --   --  0.92  --  1.01*  TROPONINI 0.14*   < >  --  0.15*  --  0.15* 0.14*  --   --   --    < > = values in this interval not displayed.    Estimated Creatinine Clearance: 42.1 mL/min (A) (by C-G  formula based on SCr of 1.01 mg/dL (H)).   Medical History: Past Medical History:  Diagnosis Date  . Anxiety    takes Xanax daily  . Arthritis    back  . Cataract   . Chronic back pain   . Constipation    OTC stool softener prn  . COPD (chronic obstructive pulmonary disease) ([place])   . Depression   . Diverticulosis   . GERD (gastroesophageal reflux disease)    takes Ranidine daily  . Hemorrhoids   . History of bronchitis   . History of migraine    many yrs ago  . History of shingles   . Hyperlipidemia   . Insomnia   . Migraines   . Nocturia   . Numbness    left leg  . Osteoporosis   . PONV (postoperative nausea and vomiting)   . Urinary frequency   . Urinary urgency    Infusions:  .  sodium chloride    . cefTRIAXone (ROCEPHIN)  IV 2 g (03/28/19 1234)  . heparin 900 Units/hr (03/28/19 1213)   PRN:  Anti-infectives (From admission, onward)   Start     Dose/Rate Route Frequency Ordered Stop   03/28/19 1000  cefTRIAXone (ROCEPHIN) 2 g in sodium chloride 0.9 % 100 mL IVPB     2 g 200 mL/hr over 30 Minutes Intravenous Every 24 hours 03/28/19 0847     03/27/19 2000  vancomycin (VANCOCIN) IVPB 1000 mg/200 mL premix  Status:  Discontinued     1,000 mg 200 mL/hr over 60 Minutes Intravenous Every 36 hours 03/26/19 1029 03/28/19 0847   03/27/19 1000  ceFEPIme (MAXIPIME) 1 g in sodium chloride 0.9 % 100 mL IVPB  Status:  Discontinued     1 g 200 mL/hr over 30 Minutes Intravenous Every 24 hours 03/26/19 1029 03/27/19 0912   03/27/19 1000  ceFEPIme (MAXIPIME) 2 g in sodium chloride 0.9 % 100 mL IVPB  Status:  Discontinued     2 g 200 mL/hr over 30 Minutes Intravenous Every 12 hours 03/27/19 0912 03/28/19 0847   03/26/19 0115  vancomycin (VANCOCIN) IVPB 1000 mg/200 mL premix     1,000 mg 200 mL/hr over 60 Minutes Intravenous  Once 03/26/19 0101 03/26/19 0915   03/26/19 0115  ceFEPIme (MAXIPIME) 2 g in sodium chloride 0.9 % 100 mL IVPB     2 g 200 mL/hr over 30 Minutes  Intravenous  Once 03/26/19 0101 03/26/19 1045   03/25/19 2230  cefTRIAXone (ROCEPHIN) 1 g in sodium chloride 0.9 % 100 mL IVPB     1 g 200 mL/hr over 30 Minutes Intravenous  Once 03/25/19 2225 03/25/19 2318   03/25/19 2230  doxycycline (VIBRA-TABS) tablet 100 mg     100 mg Oral  Once 03/25/19 2225 03/25/19 2234      Assessment: Misty [name] a 78 y.o. female requires anticoagulation with a heparin IV infusion for the indication of new onset atrial fibrillation.   Heparin level this remains at goal at 0.36.  Hgb at 10.1 with slight trend down, but no overt bleeding or complications noted and confirmed with nursing. Heparin was held from ~0800 until 1230 5/10 due to thoracentesis.   Goal of Therapy:  Heparin level 0.3-0.7 units/ml Monitor platelets by anticoagulation protocol: Yes   Plan:  Resume heparin at 900 units/hr Daily heparin level and CBC F/u plans to transition to DOAC  Misty [name] D PGY1 Pharmacy Resident  Phone [phone] Please use AMION for clinical pharmacists numbers  03/28/2019      12:38 PM

## 2019-03-28 NOTE — Progress Notes (Signed)
Per IR Heparin gtt stopped at this time for planned Thoracentesis.

## 2019-03-29 DIAGNOSIS — I5041 Acute combined systolic (congestive) and diastolic (congestive) heart failure: Secondary | ICD-10-CM

## 2019-03-29 LAB — PH, BODY FLUID: pH, Body Fluid: 7.6

## 2019-03-29 LAB — BASIC METABOLIC PANEL
Anion gap: 10 (ref 5–15)
BUN: 18 mg/dL (ref 8–23)
CO2: 38 mmol/L — ABNORMAL HIGH (ref 22–32)
Calcium: 8.5 mg/dL — ABNORMAL LOW (ref 8.9–10.3)
Chloride: 93 mmol/L — ABNORMAL LOW (ref 98–111)
Creatinine, Ser: 0.88 mg/dL (ref 0.44–1.00)
GFR calc Af Amer: >60 mL/min (ref >60)
GFR calc non Af Amer: >60 mL/min (ref >60)
Glucose, Bld: 118 mg/dL — ABNORMAL HIGH (ref 70–99)
Potassium: 3.8 mmol/L (ref 3.5–5.1)
Sodium: 141 mmol/L (ref 135–145)

## 2019-03-29 LAB — CBC
HCT: 34.8 % — ABNORMAL LOW (ref 36.0–46.0)
Hemoglobin: 10.4 g/dL — ABNORMAL LOW (ref 12.0–15.0)
MCH: 28.9 pg (ref 26.0–34.0)
MCHC: 29.9 g/dL — ABNORMAL LOW (ref 30.0–36.0)
MCV: 96.7 fL (ref 80.0–100.0)
Platelets: 169 10*3/uL (ref 150–400)
RBC: 3.6 MIL/uL — ABNORMAL LOW (ref 3.87–5.11)
RDW: 17.2 % — ABNORMAL HIGH (ref 11.5–15.5)
WBC: 6.9 10*3/uL (ref 4.0–10.5)
nRBC: 0 % (ref 0.0–0.2)

## 2019-03-29 LAB — LEGIONELLA PNEUMOPHILA SEROGP 1 UR AG: L. pneumophila Serogp 1 Ur Ag: NEGATIVE

## 2019-03-29 LAB — HEPARIN LEVEL (UNFRACTIONATED): Heparin Unfractionated: 0.36 IU/mL (ref 0.30–0.70)

## 2019-03-29 MED ORDER — FUROSEMIDE 10 MG/ML IJ SOLN
20.0000 mg | Freq: Two times a day (BID) | INTRAMUSCULAR | Status: DC
  Administered 2019-03-29 – 2019-03-31 (×5): 20 mg via INTRAVENOUS
  Filled 2019-03-29 (×5): qty 2

## 2019-03-29 MED ORDER — POTASSIUM CHLORIDE CRYS ER 20 MEQ PO TBCR
40.0000 meq | EXTENDED_RELEASE_TABLET | Freq: Every day | ORAL | Status: DC
  Administered 2019-03-29 – 2019-03-30 (×2): 40 meq via ORAL
  Filled 2019-03-29 (×3): qty 2

## 2019-03-29 MED ORDER — FUROSEMIDE 10 MG/ML IJ SOLN: 20.0000 mg | Freq: Two times a day (BID) | INTRAMUSCULAR | Status: DC

## 2019-03-29 MED ORDER — ALPRAZOLAM 0.5 MG PO TABS
0.5000 mg | ORAL_TABLET | Freq: Once | ORAL | Status: AC
  Administered 2019-03-29: 0.5 mg via ORAL
  Filled 2019-03-29: qty 1

## 2019-03-29 NOTE — Care Management Important Message (Signed)
Important Message  Patient Details  Name: Misty [name] MRN: [id number] Date of Birth: Apr 01, 1941   Medicare Important Message Given:  Yes    Judye Lorino Stefan Church 03/29/2019, 3:57 PM

## 2019-03-29 NOTE — TOC Initial Note (Signed)
Transition of Care [facility]) - Initial/Assessment Note    Patient Details  Name: Misty [name] MRN: [id number] Date of Birth: 1941-10-11  Transition of Care [facility]) CM/SW Contact:    Gala Lewandowsky, RN Phone Number: 03/29/2019, 11:18 AM  Clinical Narrative:  Pt presented for Atrial Fib- PTA from home with support of husband. Pt has DME RW and WC. Previously active with [facility]-will need HH orders for [facility] RN, PT, OT and F2F. Dan with [facility] made aware and SOC to begin within 24-48 hours post transition home. Pt uses [employer] for medications and has no problems paying for medications. Pt has PCP MD Dorinda Hill More. Husband drives patient to all appointments and she has support of her daughter as well. CM will continue to monitor for additional transition of care needs.                Expected Discharge Plan: Home w Home Health Services Barriers to Discharge: No Barriers Identified   Patient Goals and CMS Choice Patient states their goals for this hospitalization and ongoing recovery are:: "to be as independent as she can be" CMS Medicare.gov Compare Post Acute Care list provided to:: Other (Comment Required)(Patient did not need- previously with [facility]. ) Choice offered to / list presented to : Patient  Expected Discharge Plan and Services Expected Discharge Plan: Home w Home Health Services In-house Referral: NA Discharge Planning Services: CM Consult Post Acute Care Choice: Home Health Living arrangements for the past 2 months: Single Family Home                 DME Arranged: N/A DME Agency: NA       HH Arranged: RN, Disease Management, PT, OT HH Agency: [facility] Date HH Agency Contacted: 03/29/19 Time HH Agency Contacted: 1117 Representative spoke with at [facility] Agency: Jesusita Oka- left voicemail  Prior Living Arrangements/Services Living arrangements for the past 2 months: Single Family Home Lives with:: Spouse Patient language and need for interpreter  reviewed:: Yes Do you feel safe going back to the place where you live?: Yes      Need for Family Participation in Patient Care: Yes (Comment) Care giver support system in place?: Yes (comment) Current home services: DME(Pt has RW and Wheelchair) Criminal Activity/Legal Involvement Pertinent to Current Situation/Hospitalization: No - Comment as needed  Activities of Daily Living Home Assistive Devices/Equipment: None ADL Screening (condition at time of admission) Patient's cognitive ability adequate to safely complete daily activities?: Yes Is the patient deaf or have difficulty hearing?: No Does the patient have difficulty seeing, even when wearing glasses/contacts?: No Does the patient have difficulty concentrating, remembering, or making decisions?: No Patient able to express need for assistance with ADLs?: Yes Does the patient have difficulty dressing or bathing?: No Independently performs ADLs?: Yes (appropriate for developmental age) Does the patient have difficulty walking or climbing stairs?: Yes Weakness of Legs: Both Weakness of Arms/Hands: None  Permission Sought/Granted Permission sought to share information with : Family Supports Permission granted to share information with : No              Emotional Assessment Appearance:: Appears stated age Attitude/Demeanor/Rapport: Engaged Affect (typically observed): Accepting Orientation: : Oriented to Self, Oriented to Situation, Oriented to Place, Oriented to  Time Alcohol / Substance Use: Not Applicable Psych Involvement: No (comment)  Admission diagnosis:  Shortness of breath [R06.02] Hypokalemia [E87.6] Atrial tachycardia (HCC) [I47.1] Pleural effusion, right [J90] Acute congestive heart failure, unspecified heart failure type (HCC) [I50.9]  Patient Active Problem List   Diagnosis Date Noted  . Community acquired pneumonia 03/26/2019  . Atrial fibrillation with RVR (HCC) 03/25/2019  . Elevated troponin 03/25/2019   . S/P IM nail left hip 01/07/2019 01/26/2019  . H/O kyphoplasty 01/14/2019  . Iron deficiency anemia 01/14/2019  . Acute blood loss anemia 01/13/2019  . Constipation due to opioid therapy 01/13/2019  . Anxiety and depression 01/13/2019  . Hypokalemia 01/10/2019  . Displaced intertrochanteric fracture of left femur, sequela 01/06/2019  . Chronic midline low back pain without sciatica 10/09/2018  . Chronic midline thoracic back pain 10/09/2018  . Osteopenia determined by x-ray 04/16/2018  . Pulmonary fibrosis (HCC) 04/07/2018  . Non-intractable vomiting with nausea 10/06/2017  . Malnutrition of moderate degree 01/29/2016  . Generalized weakness 01/27/2016  . Abnormal chest x-ray 01/27/2016  . Tobacco use disorder 01/27/2016  . Colitis   . Hematochezia 02/26/2015  . Ischemic colitis (HCC) 02/26/2015  . Orthostatic hypotension 02/26/2015  . Nontraumatic compression fracture of T11 vertebra (HCC) 07/08/2014  . Hyperlipidemia 08/19/2013  . Fibrocystic breast disease 08/19/2013  . History of migraine headaches 08/19/2013  . GERD (gastroesophageal reflux disease) 04/07/2013  . Osteoporosis, postmenopausal 04/07/2013   PCP:  Ernestina Penna, MD Pharmacy:   [facility] [place] - [address] [place] [address] Phone: [phone] Fax: [phone]     Social Determinants of Health (SDOH) Interventions    Readmission Risk Interventions Readmission Risk Prevention Plan 03/29/2019  Transportation Screening Complete  HRI or Home Care Consult Complete  Social Work Consult for Recovery Care Planning/Counseling Complete  Palliative Care Screening Not Applicable  Medication Review Oceanographer) Complete  Some recent data might be hidden

## 2019-03-29 NOTE — Progress Notes (Signed)
Progress Note  Patient Name: Misty [name] Date of Encounter: 03/29/2019  Primary Cardiologist: Mertie Moores, MD  Subjective   Pt still on O2, SOB w/ minimal activity, no CP  Inpatient Medications    Scheduled Meds:  ALPRAZolam  0.5 mg Oral TID   bisoprolol  5 mg Oral Daily   docusate sodium  200 mg Oral BID   escitalopram  20 mg Oral QHS   feeding supplement (ENSURE ENLIVE)  237 mL Oral BID BM   ferrous [id number] C-folic acid  1 capsule Oral QAC breakfast   lidocaine  1 patch Transdermal Q24H   multivitamin with minerals  1 tablet Oral Daily   nystatin-triamcinolone ointment  1 application Topical BID   pantoprazole  40 mg Oral BID AC   sodium chloride flush  3 mL Intravenous Q12H   vitamin B-12  1,000 mcg Oral Daily   Continuous Infusions:  sodium chloride     cefTRIAXone (ROCEPHIN)  IV 2 g (03/28/19 1234)   heparin 900 Units/hr (03/28/19 1213)   PRN Meds: sodium chloride, acetaminophen **OR** acetaminophen, albuterol, ipratropium-albuterol, magic mouthwash w/lidocaine, sodium chloride flush   Vital Signs    Vitals:   03/28/19 2100 03/29/19 0017 03/29/19 0412 03/29/19 0755  BP:  95/61 113/75 111/68  Pulse:  77 81 77  Resp:  19 (!) 23 18  Temp:  (!) 97.4 F (36.3 C) 97.7 F (36.5 C) 97.8 F (36.6 C)  TempSrc:  Oral Oral Oral  SpO2:  93% 98% 100%  Weight: 60 kg  62.9 kg   Height:        Intake/Output Summary (Last 24 hours) at 03/29/2019 1022 Last data filed at 03/29/2019 0600 Gross per 24 hour  Intake 279 ml  Output 650 ml  Net -371 ml   Filed Weights   03/27/19 0456 03/28/19 2100 03/29/19 0412  Weight: 57.2 kg 60 kg 62.9 kg    Telemetry    SR - Personally reviewed.  ECG    Tracings from 03/25/2019 show atrial flutter with 2:1 block. personally reviewed.  Physical Exam   General: Well developed, frail elderly, female in no acute distress Head: Eyes PERRLA, No xanthomas.   Normocephalic and atraumatic Lungs:   Rhonchi R>L and rales L>R  Heart: HRRR S1 S2, without MRG.  Pulses are 2+ & equal. JVD 12 cm Abdomen: Bowel sounds are present, abdomen soft and non-tender without masses or  hernias noted. Msk: Normal strength and tone for age. Extremities: No clubbing, cyanosis or edema.    Skin:  No rashes or lesions noted. Neuro: Alert and oriented X 3. Psych:  Good affect, responds appropriately  Labs    Chemistry Recent Labs  Lab 03/27/19 0341 03/27/19 1322 03/28/19 0523 03/29/19 0536  NA 140  --  138 141  K 2.4* 3.1* 3.6 3.8  CL 96*  --  91* 93*  CO2 34*  --  36* 38*  GLUCOSE 122*  --  120* 118*  BUN 24*  --  21 18  CREATININE 0.92  --  1.01* 0.88  CALCIUM 8.0*  --  8.4* 8.5*  GFRNONAA >60  --  54* >60  GFRAA >60  --  >60 >60  ANIONGAP 10  --  11 10     Hematology Recent Labs  Lab 03/27/19 0341 03/28/19 0523 03/29/19 0536  WBC 7.2 8.3 6.9  RBC 3.61* 3.57* 3.60*  HGB 10.3* 10.1* 10.4*  HCT 33.4* 34.1* 34.8*  MCV 92.5 95.5 96.7  MCH 28.5  28.3 28.9  MCHC 30.8 29.6* 29.9*  RDW 16.9* 17.2* 17.2*  PLT 201 194 169    Cardiac Enzymes Recent Labs  Lab 03/25/19 2303 03/26/19 0932 03/26/19 1458 03/26/19 2054  TROPONINI 0.11* 0.15* 0.15* 0.14*    BNP Recent Labs  Lab 03/25/19 2033  BNP >4,500.0*     DDimer  Recent Labs  Lab 03/25/19 2122  DDIMER 1.69*     Radiology    Dg Chest 1 View  Result Date: 03/28/2019 CLINICAL DATA:  Status post right thoracentesis EXAM: CHEST  1 VIEW COMPARISON:  03/25/2019, 03/27/2019 FINDINGS: Near complete resolution of the right effusion following thoracentesis. Improved aeration of the right lower lobe. No pneumothorax. Small left effusion remains with left basilar partial collapse/consolidation. Stable cardiomegaly. Background parenchymal scarring, suspect COPD/emphysema. Trachea is midline. Aorta atherosclerotic. Bones are osteopenic. Remote vertebral augmentation changes. IMPRESSION: Near complete resolution of right effusion  following thoracentesis. Negative for pneumothorax. Other findings as above. Electronically Signed   By: Jerilynn Mages.  Shick M.D.   On: 03/28/2019 11:53   Ct Angio Chest Pe W Or Wo Contrast  Result Date: 03/27/2019 CLINICAL DATA:  Dyspnea. EXAM: CT ANGIOGRAPHY CHEST WITH CONTRAST TECHNIQUE: Multidetector CT imaging of the chest was performed using the standard protocol during bolus administration of intravenous contrast. Multiplanar CT image reconstructions and MIPs were obtained to evaluate the vascular anatomy. CONTRAST:  38m OMNIPAQUE IOHEXOL 350 MG/ML SOLN COMPARISON:  Radiograph of Mar 25, 2019.  CT scan of March 05, 2018. FINDINGS: Cardiovascular: Satisfactory opacification of the pulmonary arteries to the segmental level. No evidence of pulmonary embolism. Mild cardiomegaly is noted. No pericardial effusion. Atherosclerosis of thoracic aorta is noted without aneurysm formation. Mediastinum/Nodes: No enlarged mediastinal, hilar, or axillary lymph nodes. Thyroid gland, trachea, and esophagus demonstrate no significant findings. Lungs/Pleura: No pneumothorax is noted. Large right pleural effusion is noted with associated atelectasis of the right lower lobe. Minimal left pleural effusion is noted with adjacent subsegmental atelectasis. Upper Abdomen: No acute abnormality. Musculoskeletal: Status post kyphoplasty at multiple levels in the lower thoracic spine. New mild compression deformity of T10 vertebral body is noted of indeterminate age. Review of the MIP images confirms the above findings. IMPRESSION: No definite evidence of pulmonary embolus. Large right pleural effusion is noted with associated atelectasis of right lower lobe. New mild compression deformity of T10 vertebral body is noted of indeterminate age. Aortic Atherosclerosis (ICD10-I70.0). Electronically Signed   By: JMarijo ConceptionM.D.   On: 03/27/2019 13:14   UKoreaThoracentesis Asp Pleural Space W/img Guide  Result Date: 03/28/2019 INDICATION: Patient  with history of tobacco abuse, COPD, pneumonia, right pleural effusion. Request received for diagnostic and therapeutic right thoracentesis. EXAM: ULTRASOUND GUIDED DIAGNOSTIC AND THERAPEUTIC RIGHT THORACENTESIS MEDICATIONS: None COMPLICATIONS: None immediate. PROCEDURE: An ultrasound guided thoracentesis was thoroughly discussed with the patient and questions answered. The benefits, risks, alternatives and complications were also discussed. The patient understands and wishes to proceed with the procedure. Written consent was obtained. Ultrasound was performed to localize and mark an adequate pocket of fluid in the right chest. The area was then prepped and draped in the normal sterile fashion. 1% Lidocaine was used for local anesthesia. Under ultrasound guidance a 6 Fr Safe-T-Centesis catheter was introduced. Thoracentesis was performed. The catheter was removed and a dressing applied. FINDINGS: A total of approximately 900 cc of yellow fluid was removed. Samples were sent to the laboratory as requested by the clinical team. IMPRESSION: Successful ultrasound guided diagnostic and therapeutic right thoracentesis yielding  900 cc of pleural fluid. Read by: Rowe Robert, PA-C Electronically Signed   By: Jerilynn Mages.  Shick M.D.   On: 03/28/2019 11:44    Cardiac Studies   Echocardiogram 03/26/2019: 1. The left ventricle has moderate-severely reduced systolic function, with an ejection fraction of 30-35%. The cavity size was normal. There is mildly increased left ventricular wall thickness. Left ventricular diastolic function could not be evaluated secondary to atrial fibrillation. There is pre-excitation of the interventricular septum. Left ventricular diffuse hypokinesis. 2. The right ventricle has mildly reduced systolic function. The cavity was mildly enlarged. There is no increase in right ventricular wall thickness. 3. Left atrial size was moderately dilated. 4. Right atrial size was mildly dilated. 5. Trivial  pericardial effusion is present. 6. The mitral valve is abnormal. Mild thickening of the mitral valve leaflet. Mitral valve regurgitation is moderate by color flow Doppler. 7. The tricuspid valve was grossly normal. 8. The aortic valve is tricuspid Mild sclerosis of the aortic valve. Aortic valve regurgitation is trivial by color flow Doppler. No stenosis of the aortic valve. 9. The inferior vena cava was dilated in size with <50% respiratory variability.  Patient Profile     78 y.o. female with a history of COPD and tobacco abuse, hyperlipidemia, chronic back pain, admitted with pneumonia and newly documented rapid atrial fibrillation/flutter.  Assessment & Plan    1.  Atrial fibrillation/flutter, newly documented and presenting with RVR.  - CHA2DS2-VASc = 5 (age x 2, female, CHF, CAD) - spont conversion to SR - bisoprolol increased - on heparin  2.  Newly documented cardiomyopathy with LVEF 30 to 35% and diffuse hypokinesis.  She also has mildly reduced right ventricular contraction. - continue BB  - wt up 13 lbs since admission - PAS and diastolic dysfunction unclear - SBP 90s-110s, so has not been started on ACE/ARB - not currently on diuretic, has gotten Lasix 20 mg IV prn - feel she needs more IV diuretic, will order Lasix 20 mg IV bid  3.  Mild troponin I elevation and relatively flat pattern around 0.15 - Most likely demand ischemia 2nd CHF - however, atherosclerosis of thoracic aorta noted, coronary arteries not mentioned - with newly decreased EF, may need R/L heart cath - discuss timing w/ MD  4..  Community-acquired pneumonia.  - COVID 19 negative - per IM  5. Pleural effusion - s/p thoracentesis w/ great improvement in CXR  - transudative by Light's criteria  6.  Hypokalemia - nadir 2.4, s/p supplement per IM - improved - follow w/ diuresis, supplement w/ IV Lasix  Signed, Rosaria Ferries, PA-C  03/29/2019, 10:22 AM

## 2019-03-29 NOTE — Progress Notes (Signed)
[facility]                                                                              Patient Demographics  Misty [name], is a 78 y.o. female, DOB - 1941-03-31, ALP:[id number]  Admit date - 03/25/2019   Admitting Physician Jani Gravel, MD  Outpatient Primary MD for the patient is Chipper Herb, MD  Outpatient specialists:   LOS - 4  days   Medical records reviewed and are as summarized below:    Chief Complaint  Patient presents with  . Shortness of Breath       Brief summary   Patient is a 78 year old female with COPD, nicotine dependence, hyperlipidemia, GERD, anxiety presented with dyspnea for past 1 week.  Patient reported slight cough, slight palpitations.  Otherwise denied fever chills chest pain nausea vomiting.  In ED, patient was noted to be in rapid atrial fibrillation with heart rate in 130s -140s.  EKG showed heart rate of 140, LAD, Q waves in V1 to V3 Chest x-ray showed interval development of right lower lung zone opacity favored heart rate size right-sided pleural effusion, chest atelectasis, superimposed infectious process cannot be excluded, trace to small left-sided pleural effusion.  New retrocardiac opacity may represent atelectasis or infiltrates.  Patient was started on Cardizem drip in ED. K 2.9, BNP> 4500  COVID negative  Assessment & Plan    Principal Problem:   Atrial fibrillation with RVR (HCC)-new onset - BNP> 4500, d-dimer 1.69 - CT angiogram neg for PE, large right pleural effusion -TSH 2.2 - 2D echo showed EF of 30 to 35%, in A. fib, diffuse hypokinesis. - Patient was placed on Cardizem drip, heparin drip. She has spontaneously converted to NSR. - Now on beta-blocker, no CCB due to depressed EF.  Active Problems: Acute systolic CHF/cardiomyopathy with EF 30 to 35% likely precipitated due to acute A. fib with RVR -Started on diuretics, not on ACE/RB due to borderline BP.  No Cardizem due to low EF. -Continue Lasix 20  mg IV twice daily -Strict I's and O's and daily weights -Cardiology planning cardiac cath once euvolemic  COPD, community-acquired pneumonia -Urine strep pneumo positive, COVID negative, urine Legionella antigen pending  -Blood cultures negative so far -DC vancomycin and cefepime, MRSA swab negative -Antibiotics narrowed to IV Rocephin  Right sided large pleural effusion -Likely precipitated due to #1 and pneumonia -CT scan shows large right-sided pleural effusion, currently on heparin drip IR was consulted, patient underwent ultrasound-guided diagnostic and therapeutic thoracentesis on the right yielding 900 cc of yellow fluid, continue IV Rocephin  Elevated troponin -Likely due to #1, currently on heparin drip, cardiology consulted    GERD (gastroesophageal reflux disease) -Continue PPI    Malnutrition of moderate degree -Placed on nutritional supplements    Hypokalemia -Resolved    Anxiety and depression -Continue Xanax  History of iron deficiency anemia -H&H stable, at baseline  Lactic acidosis -Improving     Code Status: Full code DVT Prophylaxis: Heparin drip Family Communication: Discussed in detail with the patient, all imaging results, lab results explained to the patient and discussed with patient's husband on the phone  Disposition Plan: Not medically ready, needs cardiac cath and diuresis  Time Spent in minutes 25 minutes  Procedures:  2D echo   Consultants:   Cardiology  Antimicrobials:   Anti-infectives (From admission, onward)   Start     Dose/Rate Route Frequency Ordered Stop   03/28/19 1000  cefTRIAXone (ROCEPHIN) 2 g in sodium chloride 0.9 % 100 mL IVPB     2 g 200 mL/hr over 30 Minutes Intravenous Every 24 hours 03/28/19 0847     03/27/19 2000  vancomycin (VANCOCIN) IVPB 1000 mg/200 mL premix  Status:  Discontinued     1,000 mg 200 mL/hr over 60 Minutes Intravenous Every 36 hours 03/26/19 1029 03/28/19 0847   03/27/19 1000  ceFEPIme  (MAXIPIME) 1 g in sodium chloride 0.9 % 100 mL IVPB  Status:  Discontinued     1 g 200 mL/hr over 30 Minutes Intravenous Every 24 hours 03/26/19 1029 03/27/19 0912   03/27/19 1000  ceFEPIme (MAXIPIME) 2 g in sodium chloride 0.9 % 100 mL IVPB  Status:  Discontinued     2 g 200 mL/hr over 30 Minutes Intravenous Every 12 hours 03/27/19 0912 03/28/19 0847   03/26/19 0115  vancomycin (VANCOCIN) IVPB 1000 mg/200 mL premix     1,000 mg 200 mL/hr over 60 Minutes Intravenous  Once 03/26/19 0101 03/26/19 0915   03/26/19 0115  ceFEPIme (MAXIPIME) 2 g in sodium chloride 0.9 % 100 mL IVPB     2 g 200 mL/hr over 30 Minutes Intravenous  Once 03/26/19 0101 03/26/19 1045   03/25/19 2230  cefTRIAXone (ROCEPHIN) 1 g in sodium chloride 0.9 % 100 mL IVPB     1 g 200 mL/hr over 30 Minutes Intravenous  Once 03/25/19 2225 03/25/19 2318   03/25/19 2230  doxycycline (VIBRA-TABS) tablet 100 mg     100 mg Oral  Once 03/25/19 2225 03/25/19 2234         Medications  Scheduled Meds: . ALPRAZolam  0.5 mg Oral TID  . bisoprolol  5 mg Oral Daily  . docusate sodium  200 mg Oral BID  . escitalopram  20 mg Oral QHS  . feeding supplement (ENSURE ENLIVE)  237 mL Oral BID BM  . ferrous [id number] C-folic acid  1 capsule Oral QAC breakfast  . furosemide  20 mg Intravenous BID  . lidocaine  1 patch Transdermal Q24H  . multivitamin with minerals  1 tablet Oral Daily  . nystatin-triamcinolone ointment  1 application Topical BID  . pantoprazole  40 mg Oral BID AC  . potassium chloride  40 mEq Oral Daily  . sodium chloride flush  3 mL Intravenous Q12H  . vitamin B-12  1,000 mcg Oral Daily   Continuous Infusions: . sodium chloride    . cefTRIAXone (ROCEPHIN)  IV 2 g (03/29/19 1219)  . heparin 900 Units/hr (03/28/19 1213)   PRN Meds:.sodium chloride, acetaminophen **OR** acetaminophen, albuterol, ipratropium-albuterol, magic mouthwash w/lidocaine, sodium chloride flush      Subjective:   Misty [name] was seen and examined today.  Still feeling somewhat short of breath with exertion, no chest pain.  O2 sats 100% on 2 L.  No fevers.  No abdominal pain, nausea, vomiting, diarrhea or constipation.  Objective:   Vitals:   03/29/19 0017 03/29/19 0412 03/29/19 0755 03/29/19 1153  BP: 95/61 113/75 111/68   Pulse: 77 81 77 79  Resp: 19 (!) 23 18 (!) 27  Temp: (!) 97.4 F (36.3 C) 97.7 F (36.5 C) 97.8 F (  36.6 C) 98.1 F (36.7 C)  TempSrc: Oral Oral Oral Oral  SpO2: 93% 98% 100% 100%  Weight:  62.9 kg    Height:        Intake/Output Summary (Last 24 hours) at 03/29/2019 1430 Last data filed at 03/29/2019 0600 Gross per 24 hour  Intake 279 ml  Output 450 ml  Net -171 ml     Wt Readings from Last 3 Encounters:  03/29/19 62.9 kg  02/24/19 57.6 kg  01/28/19 57.6 kg   Physical Exam  General: Alert and oriented x 3, NAD, frail  Eyes:   HEENT:  Atraumatic, normocephalic, JVD+  Cardiovascular: S1 S2 clear, no murmurs, RRR. 1+ pedal edema b/l  Respiratory: Bibasilar Rales  Gastrointestinal: Soft, nontender, nondistended, NBS  Ext: 1+ pedal edema bilaterally  Neuro: no new deficits  Musculoskeletal: No cyanosis, clubbing  Skin: No rashes  Psych: Flat affect  alert and oriented x3       Data Reviewed:  I have personally reviewed following labs and imaging studies  Micro Results Recent Results (from the past 240 hour(s))  SARS Coronavirus 2 (CEPHEID- Performed in [facility] hospital lab), Hosp Order     Status: None   Collection Time: 03/25/19  8:58 PM  Result Value Ref Range Status   SARS Coronavirus 2 NEGATIVE NEGATIVE Final    Comment: (NOTE) If result is NEGATIVE SARS-CoV-2 target nucleic acids are NOT DETECTED. The SARS-CoV-2 RNA is generally detectable in upper and lower  respiratory specimens during the acute phase of infection. The lowest  concentration of SARS-CoV-2 viral copies this assay can detect is 250  copies / mL. A negative result does  not preclude SARS-CoV-2 infection  and should not be used as the sole basis for treatment or other  patient management decisions.  A negative result may occur with  improper specimen collection / handling, submission of specimen other  than nasopharyngeal swab, presence of viral mutation(s) within the  areas targeted by this assay, and inadequate number of viral copies  (<250 copies / mL). A negative result must be combined with clinical  observations, patient history, and epidemiological information. If result is POSITIVE SARS-CoV-2 target nucleic acids are DETECTED. The SARS-CoV-2 RNA is generally detectable in upper and lower  respiratory specimens dur ing the acute phase of infection.  Positive  results are indicative of active infection with SARS-CoV-2.  Clinical  correlation with patient history and other diagnostic information is  necessary to determine patient infection status.  Positive results do  not rule out bacterial infection or co-infection with other viruses. If result is PRESUMPTIVE POSTIVE SARS-CoV-2 nucleic acids MAY BE PRESENT.   A presumptive positive result was obtained on the submitted specimen  and confirmed on repeat testing.  While 2019 novel coronavirus  (SARS-CoV-2) nucleic acids may be present in the submitted sample  additional confirmatory testing may be necessary for epidemiological  and / or clinical management purposes  to differentiate between  SARS-CoV-2 and other Sarbecovirus currently known to infect humans.  If clinically indicated additional testing with an alternate test  methodology [phone]) is advised. The SARS-CoV-2 RNA is generally  detectable in upper and lower respiratory sp ecimens during the acute  phase of infection. The expected result is Negative. Fact Sheet for Patients:  StrictlyIdeas.no Fact Sheet for Healthcare Providers: BankingDealers.co.za This test is not yet approved or  cleared by the [place] FDA and has been authorized for detection and/or diagnosis of SARS-CoV-2 by FDA under an Emergency Use  Authorization (EUA).  This EUA will remain in effect (meaning this test can be used) for the duration of the COVID-19 declaration under Section 564(b)(1) of the Act, 21 U.S.C. section 360bbb-3(b)(1), unless the authorization is terminated or revoked sooner. Performed at [facility], [address], [place], [place] [address]   Culture, blood (routine x 2)     Status: None (Preliminary result)   Collection Time: 03/25/19  9:22 PM  Result Value Ref Range Status   Specimen Description BLOOD LEFT ANTECUBITAL  Final   Special Requests   Final    BOTTLES DRAWN AEROBIC AND ANAEROBIC Blood Culture adequate volume   Culture   Final    NO GROWTH 4 DAYS Performed at [facility], [address] [place], [place] [address]    Report Status PENDING  Incomplete  Culture, blood (routine x 2)     Status: None (Preliminary result)   Collection Time: 03/25/19  9:23 PM  Result Value Ref Range Status   Specimen Description BLOOD BLOOD LEFT WRIST  Final   Special Requests   Final    BOTTLES DRAWN AEROBIC AND ANAEROBIC Blood Culture adequate volume   Culture   Final    NO GROWTH 4 DAYS Performed at [facility], [address] [place], [place] [address]    Report Status PENDING  Incomplete  MRSA PCR Screening     Status: None   Collection Time: 03/27/19 11:43 PM  Result Value Ref Range Status   MRSA by PCR NEGATIVE NEGATIVE Final    Comment:        The GeneXpert MRSA Assay (FDA approved for NASAL specimens only), is one component of a comprehensive MRSA colonization surveillance program. It is not intended to diagnose MRSA infection nor to guide or monitor treatment for MRSA infections. Performed at [facility], [place] [address] [place], [place] [address]   Culture, body fluid-bottle     Status: None (Preliminary result)   Collection Time: 03/28/19 11:42  AM  Result Value Ref Range Status   Specimen Description PLEURAL RIGHT  Final   Special Requests NONE  Final   Culture   Final    NO GROWTH < 24 HOURS Performed at [facility], [place] [address] [place], [place] [address]    Report Status PENDING  Incomplete  Gram stain     Status: None   Collection Time: 03/28/19 11:42 AM  Result Value Ref Range Status   Specimen Description PLEURAL RIGHT  Final   Special Requests NONE  Final   Gram Stain   Final    RARE WBC PRESENT, PREDOMINANTLY PMN NO ORGANISMS SEEN Performed at [facility], [place] [address] [place], [place] [address]    Report Status 03/28/2019 FINAL  Final    Radiology Reports Dg Chest 1 View  Result Date: 03/28/2019 CLINICAL DATA:  Status post right thoracentesis EXAM: CHEST  1 VIEW COMPARISON:  03/25/2019, 03/27/2019 FINDINGS: Near complete resolution of the right effusion following thoracentesis. Improved aeration of the right lower lobe. No pneumothorax. Small left effusion remains with left basilar partial collapse/consolidation. Stable cardiomegaly. Background parenchymal scarring, suspect COPD/emphysema. Trachea is midline. Aorta atherosclerotic. Bones are osteopenic. Remote vertebral augmentation changes. IMPRESSION: Near complete resolution of right effusion following thoracentesis. Negative for pneumothorax. Other findings as above. Electronically Signed   By: Jerilynn Mages.  Shick M.D.   On: 03/28/2019 11:53   Ct Angio Chest Pe W Or Wo Contrast  Result Date: 03/27/2019 CLINICAL DATA:  Dyspnea. EXAM:  CT ANGIOGRAPHY CHEST WITH CONTRAST TECHNIQUE: Multidetector CT imaging of the chest was performed using the standard protocol during bolus administration of intravenous contrast. Multiplanar CT image reconstructions and MIPs were obtained to evaluate the vascular anatomy. CONTRAST:  59m OMNIPAQUE IOHEXOL 350 MG/ML SOLN COMPARISON:  Radiograph of Mar 25, 2019.  CT scan of March 05, 2018. FINDINGS: Cardiovascular: Satisfactory  opacification of the pulmonary arteries to the segmental level. No evidence of pulmonary embolism. Mild cardiomegaly is noted. No pericardial effusion. Atherosclerosis of thoracic aorta is noted without aneurysm formation. Mediastinum/Nodes: No enlarged mediastinal, hilar, or axillary lymph nodes. Thyroid gland, trachea, and esophagus demonstrate no significant findings. Lungs/Pleura: No pneumothorax is noted. Large right pleural effusion is noted with associated atelectasis of the right lower lobe. Minimal left pleural effusion is noted with adjacent subsegmental atelectasis. Upper Abdomen: No acute abnormality. Musculoskeletal: Status post kyphoplasty at multiple levels in the lower thoracic spine. New mild compression deformity of T10 vertebral body is noted of indeterminate age. Review of the MIP images confirms the above findings. IMPRESSION: No definite evidence of pulmonary embolus. Large right pleural effusion is noted with associated atelectasis of right lower lobe. New mild compression deformity of T10 vertebral body is noted of indeterminate age. Aortic Atherosclerosis (ICD10-I70.0). Electronically Signed   By: JMarijo ConceptionM.D.   On: 03/27/2019 13:14   Dg Chest Port 1 View  Result Date: 03/25/2019 CLINICAL DATA:  Shortness of breath. EXAM: PORTABLE CHEST 1 VIEW COMPARISON:  01/06/2019 FINDINGS: The cardiac silhouette is enlarged. There is a right lower lung zone airspace opacity favored to represent a combination of a right-sided pleural effusion and atelectasis/consolidation. No pneumothorax. There is a likely a trace left-sided effusion. A retrocardiac opacity is noted. Generalized volume overload is seen. IMPRESSION: 1. Interval development of a right lower lung zone opacity favored to represent a combination of a small to moderate size right-sided pleural effusion with adjacent atelectasis. A superimposed infectious process cannot be excluded. 2. Trace to small left-sided pleural effusion. New  retrocardiac opacity which may represent atelectasis or infiltrate. 3. Mild cardiomegaly. There is evidence of generalized volume overload. Electronically Signed   By: CConstance HolsterM.D.   On: 03/25/2019 20:55   UKoreaThoracentesis Asp Pleural Space W/img Guide  Result Date: 03/28/2019 INDICATION: Patient with history of tobacco abuse, COPD, pneumonia, right pleural effusion. Request received for diagnostic and therapeutic right thoracentesis. EXAM: ULTRASOUND GUIDED DIAGNOSTIC AND THERAPEUTIC RIGHT THORACENTESIS MEDICATIONS: None COMPLICATIONS: None immediate. PROCEDURE: An ultrasound guided thoracentesis was thoroughly discussed with the patient and questions answered. The benefits, risks, alternatives and complications were also discussed. The patient understands and wishes to proceed with the procedure. Written consent was obtained. Ultrasound was performed to localize and mark an adequate pocket of fluid in the right chest. The area was then prepped and draped in the normal sterile fashion. 1% Lidocaine was used for local anesthesia. Under ultrasound guidance a 6 Fr Safe-T-Centesis catheter was introduced. Thoracentesis was performed. The catheter was removed and a dressing applied. FINDINGS: A total of approximately 900 cc of yellow fluid was removed. Samples were sent to the laboratory as requested by the clinical team. IMPRESSION: Successful ultrasound guided diagnostic and therapeutic right thoracentesis yielding 900 cc of pleural fluid. Read by: KRowe Robert PA-C Electronically Signed   By: MJerilynn Mages  Shick M.D.   On: 03/28/2019 11:44    Lab Data:  CBC: Recent Labs  Lab 03/25/19 2033 03/26/19 0544 03/27/19 0341 03/28/19 0523 03/29/19 0536  WBC 9.3 8.7  7.2 8.3 6.9  NEUTROABS 6.2  --   --   --   --   HGB 12.7 10.9* 10.3* 10.1* 10.4*  HCT 43.2 35.4* 33.4* 34.1* 34.8*  MCV 97.7 92.2 92.5 95.5 96.7  PLT 285 237 201 194 387   Basic Metabolic Panel: Recent Labs  Lab 03/25/19 2033 03/27/19  0341 03/27/19 0713 03/27/19 1322 03/28/19 0523 03/29/19 0536  NA 142 140  --   --  138 141  K 2.9* 2.4*  --  3.1* 3.6 3.8  CL 95* 96*  --   --  91* 93*  CO2 24 34*  --   --  36* 38*  GLUCOSE 230* 122*  --   --  120* 118*  BUN 21 24*  --   --  21 18  CREATININE 1.21* 0.92  --   --  1.01* 0.88  CALCIUM 8.6* 8.0*  --   --  8.4* 8.5*  MG 2.1  --  1.8  --   --   --    GFR: Estimated Creatinine Clearance: 52.1 mL/min (by C-G formula based on SCr of 0.88 mg/dL). Liver Function Tests: No results for input(s): AST, ALT, ALKPHOS, BILITOT, PROT, ALBUMIN in the last 168 hours. No results for input(s): LIPASE, AMYLASE in the last 168 hours. No results for input(s): AMMONIA in the last 168 hours. Coagulation Profile: No results for input(s): INR, PROTIME in the last 168 hours. Cardiac Enzymes: Recent Labs  Lab 03/25/19 2033 03/25/19 2303 03/26/19 0932 03/26/19 1458 03/26/19 2054  TROPONINI 0.14* 0.11* 0.15* 0.15* 0.14*   BNP (last 3 results) No results for input(s): PROBNP in the last 8760 hours. HbA1C: No results for input(s): HGBA1C in the last 72 hours. CBG: No results for input(s): GLUCAP in the last 168 hours. Lipid Profile: No results for input(s): CHOL, HDL, LDLCALC, TRIG, CHOLHDL, LDLDIRECT in the last 72 hours. Thyroid Function Tests: No results for input(s): TSH, T4TOTAL, FREET4, T3FREE, THYROIDAB in the last 72 hours. Anemia Panel: No results for input(s): VITAMINB12, FOLATE, FERRITIN, TIBC, IRON, RETICCTPCT in the last 72 hours. Urine analysis:    Component Value Date/Time   COLORURINE YELLOW 01/14/2019 0226   APPEARANCEUR CLEAR 01/14/2019 0226   LABSPEC 1.012 01/14/2019 0226   PHURINE 7.0 01/14/2019 0226   GLUCOSEU NEGATIVE 01/14/2019 0226   HGBUR NEGATIVE 01/14/2019 0226   BILIRUBINUR NEGATIVE 01/14/2019 0226   KETONESUR NEGATIVE 01/14/2019 0226   PROTEINUR NEGATIVE 01/14/2019 0226   NITRITE NEGATIVE 01/14/2019 0226   LEUKOCYTESUR NEGATIVE 01/14/2019 0226      Ripudeep Rai M.D. [facility] 03/29/2019, 2:30 PM  Pager: [phone] Between 7am to 7pm - call Pager - [phone]  After 7pm go to [email or web address] - password [email or web address]  Call night coverage person covering after 7pm

## 2019-03-29 NOTE — Progress Notes (Signed)
ANTICOAGULATION CONSULT NOTE   Pharmacy Consult for Heparin  Indication: atrial fibrillation  Patient Measurements: Height: 5\' 7"  (170.2 cm) Weight: 138 lb 10.7 oz (62.9 kg) IBW/kg (Calculated) : 61.6 Heparin Dosing Weight: HEPARIN DW (KG): 56.7   Vital Signs: Temp: 97.8 F (36.6 C) (05/11 0755) Temp Source: Oral (05/11 0755) BP: 111/68 (05/11 0755) Pulse Rate: 77 (05/11 0755)  Labs: Recent Labs    03/26/19 1458 03/26/19 2054  03/27/19 0341 03/27/19 0713 03/28/19 0523 03/29/19 0536  HGB  --   --    < > 10.3*  --  10.1* 10.4*  HCT  --   --   --  33.4*  --  34.1* 34.8*  PLT  --   --   --  201  --  194 169  HEPARINUNFRC  --   --   --   --  0.41 0.36 0.36  CREATININE  --   --   --  0.92  --  1.01* 0.88  TROPONINI 0.15* 0.14*  --   --   --   --   --    < > = values in this interval not displayed.     Assessment: Misty [name] a 78 y.o. female requires anticoagulation with a heparin infusion for new onset atrial fibrillation.   Heparin level this remains at goal. CBC stable.  Goal of Therapy:  Heparin level 0.3-0.7 units/ml Monitor platelets by anticoagulation protocol: Yes   Plan:  Continue heparin at 900 units/hr Daily heparin level and CBC F/u plans to transition to DOAC   Baldemar Friday 03/29/2019 10:03 AM

## 2019-03-29 NOTE — Progress Notes (Signed)
PT Cancellation Note  Patient Details Name: TRENAY DRAXLER MRN: [id number] DOB: 01-04-1941   Cancelled Treatment:     pt exhausted.  Stated "they gave me Lasix" and has had multiple incont episodes and bed changes.   Felecia Shelling  PTA Acute  Rehabilitation Services Pager      [phone] Office      [phone]

## 2019-03-30 DIAGNOSIS — F419 Anxiety disorder, unspecified: Secondary | ICD-10-CM

## 2019-03-30 DIAGNOSIS — K219 Gastro-esophageal reflux disease without esophagitis: Secondary | ICD-10-CM

## 2019-03-30 DIAGNOSIS — F329 Major depressive disorder, single episode, unspecified: Secondary | ICD-10-CM

## 2019-03-30 DIAGNOSIS — E440 Moderate protein-calorie malnutrition: Secondary | ICD-10-CM

## 2019-03-30 LAB — CBC
HCT: 33.5 % — ABNORMAL LOW (ref 36.0–46.0)
Hemoglobin: 9.8 g/dL — ABNORMAL LOW (ref 12.0–15.0)
MCH: 28.4 pg (ref 26.0–34.0)
MCHC: 29.3 g/dL — ABNORMAL LOW (ref 30.0–36.0)
MCV: 97.1 fL (ref 80.0–100.0)
Platelets: 156 10*3/uL (ref 150–400)
RBC: 3.45 MIL/uL — ABNORMAL LOW (ref 3.87–5.11)
RDW: 17.4 % — ABNORMAL HIGH (ref 11.5–15.5)
WBC: 6.4 10*3/uL (ref 4.0–10.5)
nRBC: 0 % (ref 0.0–0.2)

## 2019-03-30 LAB — BASIC METABOLIC PANEL
Anion gap: 13 (ref 5–15)
BUN: 16 mg/dL (ref 8–23)
CO2: 39 mmol/L — ABNORMAL HIGH (ref 22–32)
Calcium: 8.7 mg/dL — ABNORMAL LOW (ref 8.9–10.3)
Chloride: 91 mmol/L — ABNORMAL LOW (ref 98–111)
Creatinine, Ser: 0.82 mg/dL (ref 0.44–1.00)
GFR calc Af Amer: >60 mL/min (ref >60)
GFR calc non Af Amer: >60 mL/min (ref >60)
Glucose, Bld: 112 mg/dL — ABNORMAL HIGH (ref 70–99)
Potassium: 4.1 mmol/L (ref 3.5–5.1)
Sodium: 143 mmol/L (ref 135–145)

## 2019-03-30 LAB — CULTURE, BLOOD (ROUTINE X 2)
Culture: NO GROWTH
Culture: NO GROWTH
Special Requests: ADEQUATE
Special Requests: ADEQUATE

## 2019-03-30 LAB — HEPARIN LEVEL (UNFRACTIONATED): Heparin Unfractionated: 0.47 IU/mL (ref 0.30–0.70)

## 2019-03-30 LAB — PATHOLOGIST SMEAR REVIEW

## 2019-03-30 MED ORDER — SODIUM CHLORIDE 0.9 % IV SOLN: 250.0000 mL | INTRAVENOUS | Status: DC | PRN

## 2019-03-30 MED ORDER — ASPIRIN 81 MG PO CHEW
81.0000 mg | CHEWABLE_TABLET | ORAL | Status: AC
  Administered 2019-03-31: 81 mg via ORAL
  Filled 2019-03-30: qty 1

## 2019-03-30 MED ORDER — SODIUM CHLORIDE 0.9 % IV SOLN
INTRAVENOUS | Status: DC
  Administered 2019-03-31: 06:00:00 via INTRAVENOUS

## 2019-03-30 MED ORDER — SODIUM CHLORIDE 0.9% FLUSH: 3.0000 mL | INTRAVENOUS | Status: DC | PRN

## 2019-03-30 MED ORDER — SODIUM CHLORIDE 0.9% FLUSH
3.0000 mL | Freq: Two times a day (BID) | INTRAVENOUS | Status: DC
  Administered 2019-03-30 – 2019-03-31 (×2): 3 mL via INTRAVENOUS

## 2019-03-30 NOTE — Telephone Encounter (Signed)
Patient still currently admitted.  Try TOC call 05/13

## 2019-03-30 NOTE — Progress Notes (Signed)
Physical Therapy Treatment Patient Details Name: Misty [name] MRN: [id number] DOB: [birth year] Today's Date: 03/30/2019    History of Present Illness 78 y.o. female admitted with dyspnea for 1 weeks with PNA and aFib. PMHx: COPD, nicotine dep,  GERD, Hyperlipidemia, Migraines, Anxiety, left hip ORIF 01/07/19, colitis    PT Comments    Pt requires maximal encouragement to participate with therapy today. Pt reluctantly agrees and when therapist went to remove Purewick pt found to be incontinent of stool. When ask if she knew she was dirty, pt states "yes, I knew but the NT was just in here. I hate to bother her again." Educated patient in need to be cleaned quickly to protect skin and against UTIs. Pt limited in safe mobility by incontinence and fear of incontinence with movement, with less mobility pt is getting weaker. Pt requires min A for bed mobility, transfers and ambulation of 40 feet with RW. D/c plans remain appropriate at this time as long as pt continues to ambulate to keep strength. PT will continue to follow acutely.    Follow Up Recommendations  Home health PT;Supervision/Assistance - 24 hour     Equipment Recommendations  None recommended by PT    Recommendations for Other Services OT consult     Precautions / Restrictions Precautions Precautions: Fall Precaution Comments: frequent incontinence Restrictions Weight Bearing Restrictions: No    Mobility  Bed Mobility Overal bed mobility: Needs Assistance Bed Mobility: Rolling;Supine to Sit Rolling: Min guard   Supine to sit: Min assist     General bed mobility comments: min guard for rolling to clean, and minA for bringing trunk to upright vc for scooting hips to EoB  Transfers Overall transfer level: Needs assistance Equipment used: Rolling walker (2 wheeled) Transfers: Sit to/from Stand Sit to Stand: Min assist         General transfer comment: min A for power up and steadying at  [employer]  Ambulation/Gait Ambulation/Gait assistance: Min Chemical engineer (Feet): 80 Feet Assistive device: Rolling walker (2 wheeled) Gait Pattern/deviations: Step-through pattern;Decreased stride length;Trunk flexed Gait velocity: slowed Gait velocity interpretation: <1.8 ft/sec, indicate of risk for recurrent falls General Gait Details: cues for posture, position in RW and assist to avoid environmental obstacles.        Balance Overall balance assessment: Needs assistance   Sitting balance-Leahy Scale: Fair Sitting balance - Comments: able to sit without UE support   Standing balance support: Bilateral upper extremity supported Standing balance-Leahy Scale: Poor Standing balance comment: bil UE support on RW for gait                            Cognition Arousal/Alertness: Awake/alert Behavior During Therapy: Anxious Overall Cognitive Status: Within Functional Limits for tasks assessed                                 General Comments: pt with baseline anxiety, xanax 3x/day      Exercises      General Comments General comments (skin integrity, edema, etc.): Pt found incontinent of stool and urine on entry with cleaning peri area very red, pt urinated during cleaning  stating " I just can't stop it"      Pertinent Vitals/Pain Pain Assessment: Faces Faces Pain Scale: Hurts little more Pain Location: back Pain Descriptors / Indicators: Aching;Discomfort Pain Intervention(s): Limited activity within patient's tolerance;Monitored during session;Repositioned  Home Living                      Prior Function            PT Goals (current goals can now be found in the care plan section) Acute Rehab PT Goals Patient Stated Goal: watch stories and racing, play with grandkids PT Goal Formulation: With patient Time For Goal Achievement: 04/09/19 Potential to Achieve Goals: Fair Progress towards PT goals: Progressing toward goals     Frequency    Min 3X/week      PT Plan Current plan remains appropriate    Co-evaluation              AM-PAC PT "6 Clicks" Mobility   Outcome Measure  Help needed turning from your back to your side while in a flat bed without using bedrails?: A Little Help needed moving from lying on your back to sitting on the side of a flat bed without using bedrails?: A Little Help needed moving to and from a bed to a chair (including a wheelchair)?: A Little Help needed standing up from a chair using your arms (e.g., wheelchair or bedside chair)?: A Little Help needed to walk in hospital room?: A Little Help needed climbing 3-5 steps with a railing? : A Lot 6 Click Score: 17    End of Session Equipment Utilized During Treatment: Gait belt;Oxygen Activity Tolerance: Patient limited by fatigue Patient left: in chair;with call bell/phone within reach;Other (comment)(with NT for bathing) Nurse Communication: Mobility status PT Visit Diagnosis: Other abnormalities of gait and mobility (R26.89);Muscle weakness (generalized) (M62.81);History of falling (Z91.81);Difficulty in walking, not elsewhere classified (R26.2)     Time: 0865-7846 PT Time Calculation (min) (ACUTE ONLY): 19 min  Charges:  $Gait Training: 8-22 mins                     Misty [name] PT, DPT Acute Rehabilitation Services Pager [phone] Office [phone]    Elon Alas [facility] 03/30/2019, 4:28 PM

## 2019-03-30 NOTE — Progress Notes (Signed)
Occupational Therapy Treatment Patient Details Name: Misty [name] MRN: [id number] DOB: November 06, 1941 Today's Date: 03/30/2019    History of present illness 78 y.o. female admitted with dyspnea for 1 weeks with PNA and aFib. PMHx: COPD, nicotine dep,  GERD, Hyperlipidemia, Migraines, Anxiety, left hip ORIF 01/07/19, colitis   OT comments  Pt bed level care this session due to incontinence and lack of awareness to incontinence to request help. Pt only complaint was feeling cold with x5 blankets on her. Pt after hygiene reports feeling warmer and comfortable.   Follow Up Recommendations  SNF;Supervision/Assistance - 24 hour    Equipment Recommendations  None recommended by OT    Recommendations for Other Services PT consult    Precautions / Restrictions Precautions Precautions: Fall Precaution Comments: frequent incontinence       Mobility Bed Mobility Overal bed mobility: Needs Assistance Bed Mobility: Rolling Rolling: Mod assist         General bed mobility comments: pt rolling to the L side easier than R side. pt rolling R and L x3 sets to complete hygiene and linen change. pt reports feeling helpless due to constant need to void. pt provided barrier cream due to incontinence causing skin to be irriated  Social research officer, government                                           ADL either performed or assessed with clinical judgement   ADL Overall ADL's : Needs assistance/impaired Eating/Feeding: Set up;Bed level           Lower Body Bathing: Maximal assistance       Lower Body Dressing: Total assistance Lower Body Dressing Details (indicate cue type and reason): don socks               General ADL Comments: pt reporting feeling cold. pt denies being in a wet bed but allowed therapist to check and the patient in fact was wet with urine and poop. pt provided clean linen and no longer reports feeling cold     Vision        Perception     Praxis      Cognition Arousal/Alertness: Awake/alert Behavior During Therapy: WFL for tasks assessed/performed Overall Cognitive Status: Within Functional Limits for tasks assessed                                          Exercises     Shoulder Instructions       General Comments redness in peri area with barrier cream applied    Pertinent Vitals/ Pain       Pain Assessment: Faces Faces Pain Scale: Hurts little more Pain Location: generalized discomfort Pain Intervention(s): Monitored during session;Premedicated before session;Repositioned  Home Living                                          Prior Functioning/Environment              Frequency  Min 2X/week        Progress Toward Goals  OT Goals(current goals can now be found in the care plan section)  Progress towards OT goals: Progressing toward goals  Acute Rehab OT Goals Patient Stated Goal: when will this stop OT Goal Formulation: With patient Time For Goal Achievement: 04/10/19 Potential to Achieve Goals: Good ADL Goals Pt Will Perform Grooming: with set-up;with supervision;standing Pt Will Perform Lower Body Dressing: with set-up;with supervision;sit to/from stand Pt Will Transfer to Toilet: with set-up;with supervision;ambulating;regular height toilet Pt Will Perform Toileting - Clothing Manipulation and hygiene: with set-up;with supervision;sitting/lateral leans;sit to/from stand Pt Will Perform Tub/Shower Transfer: with min guard assist;Tub transfer;ambulating;shower seat;rolling walker  Plan Discharge plan remains appropriate    Co-evaluation                 AM-PAC OT "6 Clicks" Daily Activity     Outcome Measure   Help from another person eating meals?: None Help from another person taking care of personal grooming?: A Little Help from another person toileting, which includes using toliet, bedpan, or urinal?: A Lot Help  from another person bathing (including washing, rinsing, drying)?: A Lot Help from another person to put on and taking off regular upper body clothing?: A Little Help from another person to put on and taking off regular lower body clothing?: A Lot 6 Click Score: 16    End of Session    OT Visit Diagnosis: Unsteadiness on feet (R26.81);Other abnormalities of gait and mobility (R26.89);Muscle weakness (generalized) (M62.81);Other symptoms and signs involving cognitive function   Activity Tolerance Patient tolerated treatment well   Patient Left in bed;with call bell/phone within reach;with bed alarm set   Nurse Communication Mobility status;Precautions        Time: 1109(1109)-1140 OT Time Calculation (min): 31 min  Charges: OT General Charges $OT Visit: 1 Visit OT Treatments $Self Care/Home Management : 23-37 mins   Mateo Flow, OTR/L  Acute Rehabilitation Services Pager: [phone] Office: [phone] .    Mateo Flow 03/30/2019, 1:24 PM

## 2019-03-30 NOTE — Progress Notes (Addendum)
PROGRESS NOTE  Misty [name] QIO:[id number] DOB: [birth year] DOA: 03/25/2019 PCP: Chipper Herb, MD   LOS: 5 days   Brief narrative: Patient is a 78 year old female with COPD, nicotine dependence, hyperlipidemia, GERD, anxiety presented to the hospital with complaints of dyspnea for past 1 week.  Patient reported slight cough and palpitations. In ED, patient was noted to be in rapid atrial fibrillation with heart rate in 130s -140s.  EKG showed heart rate of 140, LAD, Q waves in V1 to V3. Chest x-ray showed interval development of right lower lung zone opacity, right-sided pleural effusion, chest atelectasis, superimposed infectious process cannot be excluded, trace to small left-sided pleural effusion.  New retrocardiac opacity may represent atelectasis or infiltrates.  Patient was started on Cardizem drip in ED. K 2.9, BNP> 4500.  Subjective: Patient stated that she feels overall better.  Denies any chest pain, palpitation or dyspnea.  She does have some loose stools.  Denies any nausea or vomiting   Assessment/Plan:  Principal Problem:   Atrial fibrillation with RVR (HCC) Active Problems:   GERD (gastroesophageal reflux disease)   Malnutrition of moderate degree   Hypokalemia   Anxiety and depression   Iron deficiency anemia   S/P IM nail left hip 01/07/2019   Elevated troponin   Community acquired pneumonia  Atrial fibrillation with RVR -new onset.- BNP> 4500, d-dimer 1.69 on presentation. - CT angiogram was agree for pulmonary embolism but large right pleural effusion.  TSH was 2.2.  2D echocardiogram showed decreased ejection fraction with EF of 30 to 35% with diffuse hypokinesis.  Patient was initially put on Cardizem and heparin drip, subsequently converted to normal sinus rhythm.  Currently on bisoprolol.  Cardiology on board.  Acute systolic CHF/cardiomyopathy with EF 30 to 35% likely precipitated due to acute A. fib with RVR.  On Lasix 20 mg IV twice daily.  CHF protocol.   Awaiting for cardiac cath likely tomorrow.  Still on heparin drip.  COPD, community-acquired pneumonia secondary to Streptococcus pneumonia. Urine Streptococcus pneumoniae antigen positive.  COVID-19 was negative.  Continue IV Rocephin.  Blood cultures negative in 5 days.  Right sided large pleural effusion secondary to CHF. CT scan of the chest showed a large right-sided pleural effusion.  Status post ultrasound guided diagnostic and therapeutic thoracocentesis with removal of 900 mL of fluid.   Gram Stain of the pleural fluid shows rare WBC no organisms, fluid culture negative in 2 days.   Transudative pleural effusion likely from CHF.  Elevated troponin currently on heparin drip, cardiology consulted.  Awaiting for cardiac cath     GERD (gastroesophageal reflux disease) Continue Protonix  Moderate protein calorie malnutrition. Continue nutritional supplements.    Hypokalemia Improved.  Closely monitor with BMP.    Anxiety and depression Continue Xanax  History of iron deficiency anemia -H&H stable, at baseline.  Hemoglobin of 9.8 today.  VTE Prophylaxis: Currently on heparin drip  Code Status: Full code  Family Communication: None  Disposition Plan: Skilled nursing facility as per PT recommendations.  Awaiting for cardiac catheterization.   Consultants:  Cardiology  Procedures:  Ultrasound-guided thoracocentesis on 03/28/2019 by interventional radiology.  Antibiotics: Anti-infectives (From admission, onward)   Start     Dose/Rate Route Frequency Ordered Stop   03/28/19 1000  cefTRIAXone (ROCEPHIN) 2 g in sodium chloride 0.9 % 100 mL IVPB     2 g 200 mL/hr over 30 Minutes Intravenous Every 24 hours 03/28/19 0847     03/27/19 2000  vancomycin (VANCOCIN) IVPB  1000 mg/200 mL premix  Status:  Discontinued     1,000 mg 200 mL/hr over 60 Minutes Intravenous Every 36 hours 03/26/19 1029 03/28/19 0847   03/27/19 1000  ceFEPIme (MAXIPIME) 1 g in sodium chloride 0.9  % 100 mL IVPB  Status:  Discontinued     1 g 200 mL/hr over 30 Minutes Intravenous Every 24 hours 03/26/19 1029 03/27/19 0912   03/27/19 1000  ceFEPIme (MAXIPIME) 2 g in sodium chloride 0.9 % 100 mL IVPB  Status:  Discontinued     2 g 200 mL/hr over 30 Minutes Intravenous Every 12 hours 03/27/19 0912 03/28/19 0847   03/26/19 0115  vancomycin (VANCOCIN) IVPB 1000 mg/200 mL premix     1,000 mg 200 mL/hr over 60 Minutes Intravenous  Once 03/26/19 0101 03/26/19 0915   03/26/19 0115  ceFEPIme (MAXIPIME) 2 g in sodium chloride 0.9 % 100 mL IVPB     2 g 200 mL/hr over 30 Minutes Intravenous  Once 03/26/19 0101 03/26/19 1045   03/25/19 2230  cefTRIAXone (ROCEPHIN) 1 g in sodium chloride 0.9 % 100 mL IVPB     1 g 200 mL/hr over 30 Minutes Intravenous  Once 03/25/19 2225 03/25/19 2318   03/25/19 2230  doxycycline (VIBRA-TABS) tablet 100 mg     100 mg Oral  Once 03/25/19 2225 03/25/19 2234      Objective: Vitals:   03/30/19 0411 03/30/19 0811  BP: 113/62 115/64  Pulse: 75   Resp: 14 (!) 29  Temp: 97.9 F (36.6 C)   SpO2: 100%     Intake/Output Summary (Last 24 hours) at 03/30/2019 1134 Last data filed at 03/30/2019 0600 Gross per 24 hour  Intake 599.84 ml  Output 2400 ml  Net -1800.16 ml   Filed Weights   03/28/19 2100 03/29/19 0412 03/30/19 0411  Weight: 60 kg 62.9 kg 60.4 kg   Body mass index is 20.86 kg/m.   Physical Exam: GENERAL: Patient is alert awake and oriented.  Thinly built.  On nasal cannula oxygen. HENT: No scleral pallor or icterus. Pupils equally reactive to light. Oral mucosa is moist NECK: is supple, no palpable thyroid enlargement. CHEST:  Diminished breath sounds bilaterally.  No wheezing.  Basal crackles noted. CVS: S1 and S2 heard, no murmur. Regular rate and rhythm. No pericardial rub. ABDOMEN: Soft, non-tender, bowel sounds are present. No palpable hepato-splenomegaly. EXTREMITIES: Trace edema noted. CNS: Cranial nerves are intact. No focal motor or  sensory deficits. SKIN: warm and dry without rashes.  Data Review: I have personally reviewed the following laboratory data and studies,  CBC: Recent Labs  Lab 03/25/19 2033 03/26/19 0544 03/27/19 0341 03/28/19 0523 03/29/19 0536 03/30/19 0345  WBC 9.3 8.7 7.2 8.3 6.9 6.4  NEUTROABS 6.2  --   --   --   --   --   HGB 12.7 10.9* 10.3* 10.1* 10.4* 9.8*  HCT 43.2 35.4* 33.4* 34.1* 34.8* 33.5*  MCV 97.7 92.2 92.5 95.5 96.7 97.1  PLT 285 237 201 194 169 453   Basic Metabolic Panel: Recent Labs  Lab 03/25/19 2033 03/27/19 0341 03/27/19 0713 03/27/19 1322 03/28/19 0523 03/29/19 0536 03/30/19 0345  NA 142 140  --   --  138 141 143  K 2.9* 2.4*  --  3.1* 3.6 3.8 4.1  CL 95* 96*  --   --  91* 93* 91*  CO2 24 34*  --   --  36* 38* 39*  GLUCOSE 230* 122*  --   --  120* 118* 112*  BUN 21 24*  --   --  21 18 16   CREATININE 1.21* 0.92  --   --  1.01* 0.88 0.82  CALCIUM 8.6* 8.0*  --   --  8.4* 8.5* 8.7*  MG 2.1  --  1.8  --   --   --   --    Liver Function Tests: No results for input(s): AST, ALT, ALKPHOS, BILITOT, PROT, ALBUMIN in the last 168 hours. No results for input(s): LIPASE, AMYLASE in the last 168 hours. No results for input(s): AMMONIA in the last 168 hours. Cardiac Enzymes: Recent Labs  Lab 03/25/19 2033 03/25/19 2303 03/26/19 0932 03/26/19 1458 03/26/19 2054  TROPONINI 0.14* 0.11* 0.15* 0.15* 0.14*   BNP (last 3 results) Recent Labs    03/25/19 2033  BNP >4,500.0*    ProBNP (last 3 results) No results for input(s): PROBNP in the last 8760 hours.  CBG: No results for input(s): GLUCAP in the last 168 hours. Recent Results (from the past 240 hour(s))  SARS Coronavirus 2 (CEPHEID- Performed in [facility] hospital lab), Hosp Order     Status: None   Collection Time: 03/25/19  8:58 PM  Result Value Ref Range Status   SARS Coronavirus 2 NEGATIVE NEGATIVE Final    Comment: (NOTE) If result is NEGATIVE SARS-CoV-2 target nucleic acids are NOT  DETECTED. The SARS-CoV-2 RNA is generally detectable in upper and lower  respiratory specimens during the acute phase of infection. The lowest  concentration of SARS-CoV-2 viral copies this assay can detect is 250  copies / mL. A negative result does not preclude SARS-CoV-2 infection  and should not be used as the sole basis for treatment or other  patient management decisions.  A negative result may occur with  improper specimen collection / handling, submission of specimen other  than nasopharyngeal swab, presence of viral mutation(s) within the  areas targeted by this assay, and inadequate number of viral copies  (<250 copies / mL). A negative result must be combined with clinical  observations, patient history, and epidemiological information. If result is POSITIVE SARS-CoV-2 target nucleic acids are DETECTED. The SARS-CoV-2 RNA is generally detectable in upper and lower  respiratory specimens dur ing the acute phase of infection.  Positive  results are indicative of active infection with SARS-CoV-2.  Clinical  correlation with patient history and other diagnostic information is  necessary to determine patient infection status.  Positive results do  not rule out bacterial infection or co-infection with other viruses. If result is PRESUMPTIVE POSTIVE SARS-CoV-2 nucleic acids MAY BE PRESENT.   A presumptive positive result was obtained on the submitted specimen  and confirmed on repeat testing.  While 2019 novel coronavirus  (SARS-CoV-2) nucleic acids may be present in the submitted sample  additional confirmatory testing may be necessary for epidemiological  and / or clinical management purposes  to differentiate between  SARS-CoV-2 and other Sarbecovirus currently known to infect humans.  If clinically indicated additional testing with an alternate test  methodology [phone]) is advised. The SARS-CoV-2 RNA is generally  detectable in upper and lower respiratory sp ecimens during  the acute  phase of infection. The expected result is Negative. Fact Sheet for Patients:  StrictlyIdeas.no Fact Sheet for Healthcare Providers: BankingDealers.co.za This test is not yet approved or cleared by the [place] FDA and has been authorized for detection and/or diagnosis of SARS-CoV-2 by FDA under an Emergency Use Authorization (EUA).  This EUA will remain in effect (meaning  this test can be used) for the duration of the COVID-19 declaration under Section 564(b)(1) of the Act, 21 U.S.C. section 360bbb-3(b)(1), unless the authorization is terminated or revoked sooner. Performed at [facility], [address] [place], [place] [address]   Culture, blood (routine x 2)     Status: None   Collection Time: 03/25/19  9:22 PM  Result Value Ref Range Status   Specimen Description BLOOD LEFT ANTECUBITAL  Final   Special Requests   Final    BOTTLES DRAWN AEROBIC AND ANAEROBIC Blood Culture adequate volume   Culture   Final    NO GROWTH 5 DAYS Performed at [facility], [address], [place], [place] [address]    Report Status 03/30/2019 FINAL  Final  Culture, blood (routine x 2)     Status: None   Collection Time: 03/25/19  9:23 PM  Result Value Ref Range Status   Specimen Description BLOOD BLOOD LEFT WRIST  Final   Special Requests   Final    BOTTLES DRAWN AEROBIC AND ANAEROBIC Blood Culture adequate volume   Culture   Final    NO GROWTH 5 DAYS Performed at [facility], [address] [place], [place] [address]    Report Status 03/30/2019 FINAL  Final  MRSA PCR Screening     Status: None   Collection Time: 03/27/19 11:43 PM  Result Value Ref Range Status   MRSA by PCR NEGATIVE NEGATIVE Final    Comment:        The GeneXpert MRSA Assay (FDA approved for NASAL specimens only), is one component of a comprehensive MRSA colonization surveillance program. It is not intended to diagnose MRSA infection nor to guide  or monitor treatment for MRSA infections. Performed at [facility], [place] [address], [place], [place] [address]   Culture, body fluid-bottle     Status: None (Preliminary result)   Collection Time: 03/28/19 11:42 AM  Result Value Ref Range Status   Specimen Description PLEURAL RIGHT  Final   Special Requests NONE  Final   Culture   Final    NO GROWTH 2 DAYS Performed at [facility], [place] [address] [place], [place] [address]    Report Status PENDING  Incomplete  Gram stain     Status: None   Collection Time: 03/28/19 11:42 AM  Result Value Ref Range Status   Specimen Description PLEURAL RIGHT  Final   Special Requests NONE  Final   Gram Stain   Final    RARE WBC PRESENT, PREDOMINANTLY PMN NO ORGANISMS SEEN Performed at [facility], [place] [address], [place], [place] [address]    Report Status 03/28/2019 FINAL  Final     Studies: Dg Chest 1 View  Result Date: 03/28/2019 CLINICAL DATA:  Status post right thoracentesis EXAM: CHEST  1 VIEW COMPARISON:  03/25/2019, 03/27/2019 FINDINGS: Near complete resolution of the right effusion following thoracentesis. Improved aeration of the right lower lobe. No pneumothorax. Small left effusion remains with left basilar partial collapse/consolidation. Stable cardiomegaly. Background parenchymal scarring, suspect COPD/emphysema. Trachea is midline. Aorta atherosclerotic. Bones are osteopenic. Remote vertebral augmentation changes. IMPRESSION: Near complete resolution of right effusion following thoracentesis. Negative for pneumothorax. Other findings as above. Electronically Signed   By: Jerilynn Mages.  Shick M.D.   On: 03/28/2019 11:53   US Thoracentesis Asp Pleural Space W/img Guide  Result Date: 03/28/2019 INDICATION: Patient with history of tobacco abuse, COPD, pneumonia, right pleural effusion. Request received for diagnostic and therapeutic right  thoracentesis. EXAM: ULTRASOUND GUIDED DIAGNOSTIC AND THERAPEUTIC RIGHT THORACENTESIS  MEDICATIONS: None COMPLICATIONS: None immediate. PROCEDURE: An ultrasound guided thoracentesis was thoroughly discussed with the patient and questions answered. The benefits, risks, alternatives and complications were also discussed. The patient understands and wishes to proceed with the procedure. Written consent was obtained. Ultrasound was performed to localize and mark an adequate pocket of fluid in the right chest. The area was then prepped and draped in the normal sterile fashion. 1% Lidocaine was used for local anesthesia. Under ultrasound guidance a 6 Fr Safe-T-Centesis catheter was introduced. Thoracentesis was performed. The catheter was removed and a dressing applied. FINDINGS: A total of approximately 900 cc of yellow fluid was removed. Samples were sent to the laboratory as requested by the clinical team. IMPRESSION: Successful ultrasound guided diagnostic and therapeutic right thoracentesis yielding 900 cc of pleural fluid. Read by: Rowe Robert, PA-C Electronically Signed   By: Jerilynn Mages.  Shick M.D.   On: 03/28/2019 11:44    Scheduled Meds:  ALPRAZolam  0.5 mg Oral TID   bisoprolol  5 mg Oral Daily   docusate sodium  200 mg Oral BID   escitalopram  20 mg Oral QHS   feeding supplement (ENSURE ENLIVE)  237 mL Oral BID BM   ferrous [id number] C-folic acid  1 capsule Oral QAC breakfast   furosemide  20 mg Intravenous BID   lidocaine  1 patch Transdermal Q24H   multivitamin with minerals  1 tablet Oral Daily   nystatin-triamcinolone ointment  1 application Topical BID   pantoprazole  40 mg Oral BID AC   potassium chloride  40 mEq Oral Daily   sodium chloride flush  3 mL Intravenous Q12H   vitamin B-12  1,000 mcg Oral Daily    Continuous Infusions:  sodium chloride     cefTRIAXone (ROCEPHIN)  IV 2 g (03/30/19 0948)   heparin 900 Units/hr (03/28/19 1213)     Flora Lipps, MD  [facility] 03/30/2019

## 2019-03-30 NOTE — Progress Notes (Signed)
Brief update:  Returned to speak to Ms. Eulas Post about cath. All questions answered. She wishes to proceed with R and LHC tomorrow.  Risks and benefits of cardiac catheterization have been discussed with the patient.  These include bleeding, infection, kidney damage, stroke, heart attack, death.  The patient understands these risks and is willing to proceed.  We will put orders in, NPO at midnight.  Buford Dresser, MD, PhD [facility]  [address] [place], [place] [address] [phone]

## 2019-03-30 NOTE — Progress Notes (Signed)
ANTICOAGULATION CONSULT NOTE - Follow Up Consult  Pharmacy Consult for Heparin Indication: atrial fibrillation  Allergies  Allergen Reactions  . Codeine Nausea And Vomiting  . Tramadol Nausea And Vomiting  . Asa [Aspirin] Other (See Comments)    Patient is unaware of allergy  . Azithromycin Other (See Comments)    Patient is unaware of allergy  . Celebrex [Celecoxib] Other (See Comments)    Irritated stomach   . Cymbalta [Duloxetine Hcl] Other (See Comments)    Felt groggy  . Prednisone Rash    She has seen the podiatrist and he had injected cortisone in her feet and she had also taken a peel at home. She had a severe reaction to her face with a rash and had to take Benadryl to resolve the symptom. She actually did get more cortisone shots, but no additional reactions to the shots.  . Vioxx [Rofecoxib] Other (See Comments)    Unknown  . Zelnorm [Tegaserod] Other (See Comments)    Patient is unaware of allergy  . Zocor [Simvastatin] Other (See Comments)    Patient is unaware of allergy    Patient Measurements: Height: 5\' 7"  (170.2 cm) Weight: 133 lb 2.5 oz (60.4 kg) IBW/kg (Calculated) : 61.6 Heparin Dosing Weight:  60.4 kg  Vital Signs: Temp: 97.9 F (36.6 C) (05/12 0411) Temp Source: Oral (05/12 0411) BP: 115/64 (05/12 0811) Pulse Rate: 75 (05/12 0411)  Labs: Recent Labs    03/28/19 0523 03/29/19 0536 03/30/19 0345  HGB 10.1* 10.4* 9.8*  HCT 34.1* 34.8* 33.5*  PLT 194 169 156  HEPARINUNFRC 0.36 0.36 0.47  CREATININE 1.01* 0.88 0.82    Estimated Creatinine Clearance: 54.8 mL/min (by C-G formula based on SCr of 0.82 mg/dL).   Assessment:  Anticoag: new afib/flutter - on heparin. CHA2DS2-VASc = 5. HL 0.47 in goal. Hgb 10.4>9.8. Plts stable. - Heparin held 5/10 am for thoracentesis  Goal of Therapy:  Heparin level 0.3-0.7 units/ml Monitor platelets by anticoagulation protocol: Yes   Plan:  Continue heparin at 900 units/hr Daily HL, CBC Possibly  cath  Larue Drawdy S. Merilynn Finland, PharmD, BCPS Clinical Staff Pharmacist [facility] Stillinger 03/30/2019,10:07 AM

## 2019-03-30 NOTE — Consult Note (Signed)
[facility] Inpatient Consult   03/30/2019  Misty [name] 03/01/1941 [id number]    Patient screened for potential [facility] Care Management serviceswith her Medicare/ Next Gen plan, with 24% (high) unplanned readmission risk score, had 2 hospitalizations in the past 6 months.  Heron Sabins shows that patient is a 78 y.o. female with a history of COPD and tobacco abuse, hyperlipidemia, chronic back pain, admitted with pneumonia and newly documented rapid atrial fibrillation/ flutter. COVID 19 negative. Chest x-ray showed right-sided pleural effusion, chest atelectasis, superimposed infectious process cannot be excluded, trace to small left-sided pleural effusion. (atrial fibrillation with RVR -new onset, COPD, community-acquired pneumonia, Newly documented cardiomyopathy with LVEF 30 to 35% and diffuse hypokinesis)  Her primary care provider is Dr.Donald Christell Constant with [facility], listed as providing transition of care.  Called and spoke to transition of care CM for disposition and possible discharge needs, states patient will undergo cardiac cath in am 5/13, and continue to be diuresed with Lasix. No other needs identified.  Per transition of care CM, current discharge disposition is home with home health services (PT, RN, OT) with [facility] which patient is previously active with.   Was able to speak to patient over the hospital room phone (HIPAA verified) and reports that she "pees a lot". She states living with her husband at home. She denies any barriers/ needs related to medications, pharmacy and transportation- which is being provided by her husband and daughter in-law. She follows-up with primary care provider when needed. According to patient, care and support is provided by husband and ex-daughter in-law; has plans to obtain a weighing scale to monitor weight post discharge. Patient is aware of low salt diet, staying active (walking), and able  take medications without any  problem.  Patient had opted to receive EMMI calls for follow-up of her recovery at home.  Will refer patient for EMMIPneumonia calls to follow-up after discharge. Patient expressed gratitude for calling and talking with her.   For questions and additional information, please contact:  Yuriana Gaal A. Jonnatan Hanners, BSN, RN-BC [facility] Liaison Cell: [phone]

## 2019-03-30 NOTE — Progress Notes (Signed)
Progress Note  Patient Name: Misty [name] Date of Encounter: 03/30/2019  Primary Cardiologist: Mertie Moores, MD  Subjective   Slept better, shortness of breath improving. Said all she did was "pee" the last day. Was shocked at how much fluid she had. Discussed cath today, see below, she is contemplating.  Inpatient Medications    Scheduled Meds:  ALPRAZolam  0.5 mg Oral TID   bisoprolol  5 mg Oral Daily   docusate sodium  200 mg Oral BID   escitalopram  20 mg Oral QHS   feeding supplement (ENSURE ENLIVE)  237 mL Oral BID BM   ferrous [id number] C-folic acid  1 capsule Oral QAC breakfast   furosemide  20 mg Intravenous BID   lidocaine  1 patch Transdermal Q24H   multivitamin with minerals  1 tablet Oral Daily   nystatin-triamcinolone ointment  1 application Topical BID   pantoprazole  40 mg Oral BID AC   potassium chloride  40 mEq Oral Daily   sodium chloride flush  3 mL Intravenous Q12H   vitamin B-12  1,000 mcg Oral Daily   Continuous Infusions:  sodium chloride     cefTRIAXone (ROCEPHIN)  IV 2 g (03/29/19 1219)   heparin 900 Units/hr (03/28/19 1213)   PRN Meds: sodium chloride, acetaminophen **OR** acetaminophen, albuterol, ipratropium-albuterol, magic mouthwash w/lidocaine, sodium chloride flush   Vital Signs    Vitals:   03/29/19 2013 03/29/19 2340 03/30/19 0411 03/30/19 0811  BP:  112/63 113/62 115/64  Pulse:  81 75   Resp:  20 14 (!) 29  Temp: 97.7 F (36.5 C)  97.9 F (36.6 C)   TempSrc: Oral  Oral   SpO2:  100% 100%   Weight:   60.4 kg   Height:        Intake/Output Summary (Last 24 hours) at 03/30/2019 0854 Last data filed at 03/30/2019 0600 Gross per 24 hour  Intake 635.81 ml  Output 2400 ml  Net -1764.19 ml   Filed Weights   03/28/19 2100 03/29/19 0412 03/30/19 0411  Weight: 60 kg 62.9 kg 60.4 kg    Telemetry    SR - Personally reviewed.  ECG    No new since 5/7- personally reviewed.  Physical Exam    General: Well developed, frail elderly, female in no acute distress Head: EOMi.   Normocephalic and atraumatic Lungs:  Rales L>R, clear in upper fields. Heart: HRRR S1 S2, without MRG.  Pulses are 2+ & equal. JVD 10 cm (improved from yesterday) Abdomen: Bowel sounds are present, abdomen soft and non-tender without masses or  hernias noted. Msk: Normal strength and tone for age. Extremities: No clubbing, cyanosis or edema.    Skin:  No rashes or lesions noted. Neuro: Alert and oriented X 3. Psych:  Good affect, responds appropriately  Labs    Chemistry Recent Labs  Lab 03/28/19 0523 03/29/19 0536 03/30/19 0345  NA 138 141 143  K 3.6 3.8 4.1  CL 91* 93* 91*  CO2 36* 38* 39*  GLUCOSE 120* 118* 112*  BUN 21 18 16   CREATININE 1.01* 0.88 0.82  CALCIUM 8.4* 8.5* 8.7*  GFRNONAA 54* >60 >60  GFRAA >60 >60 >60  ANIONGAP 11 10 13      Hematology Recent Labs  Lab 03/28/19 0523 03/29/19 0536 03/30/19 0345  WBC 8.3 6.9 6.4  RBC 3.57* 3.60* 3.45*  HGB 10.1* 10.4* 9.8*  HCT 34.1* 34.8* 33.5*  MCV 95.5 96.7 97.1  MCH 28.3 28.9 28.4  MCHC 29.6*  29.9* 29.3*  RDW 17.2* 17.2* 17.4*  PLT 194 169 156    Cardiac Enzymes Recent Labs  Lab 03/25/19 2303 03/26/19 0932 03/26/19 1458 03/26/19 2054  TROPONINI 0.11* 0.15* 0.15* 0.14*    BNP Recent Labs  Lab 03/25/19 2033  BNP >4,500.0*     DDimer  Recent Labs  Lab 03/25/19 2122  DDIMER 1.69*     Radiology    Dg Chest 1 View  Result Date: 03/28/2019 CLINICAL DATA:  Status post right thoracentesis EXAM: CHEST  1 VIEW COMPARISON:  03/25/2019, 03/27/2019 FINDINGS: Near complete resolution of the right effusion following thoracentesis. Improved aeration of the right lower lobe. No pneumothorax. Small left effusion remains with left basilar partial collapse/consolidation. Stable cardiomegaly. Background parenchymal scarring, suspect COPD/emphysema. Trachea is midline. Aorta atherosclerotic. Bones are osteopenic. Remote  vertebral augmentation changes. IMPRESSION: Near complete resolution of right effusion following thoracentesis. Negative for pneumothorax. Other findings as above. Electronically Signed   By: Jerilynn Mages.  Shick M.D.   On: 03/28/2019 11:53   US Thoracentesis Asp Pleural Space W/img Guide  Result Date: 03/28/2019 INDICATION: Patient with history of tobacco abuse, COPD, pneumonia, right pleural effusion. Request received for diagnostic and therapeutic right thoracentesis. EXAM: ULTRASOUND GUIDED DIAGNOSTIC AND THERAPEUTIC RIGHT THORACENTESIS MEDICATIONS: None COMPLICATIONS: None immediate. PROCEDURE: An ultrasound guided thoracentesis was thoroughly discussed with the patient and questions answered. The benefits, risks, alternatives and complications were also discussed. The patient understands and wishes to proceed with the procedure. Written consent was obtained. Ultrasound was performed to localize and mark an adequate pocket of fluid in the right chest. The area was then prepped and draped in the normal sterile fashion. 1% Lidocaine was used for local anesthesia. Under ultrasound guidance a 6 Fr Safe-T-Centesis catheter was introduced. Thoracentesis was performed. The catheter was removed and a dressing applied. FINDINGS: A total of approximately 900 cc of yellow fluid was removed. Samples were sent to the laboratory as requested by the clinical team. IMPRESSION: Successful ultrasound guided diagnostic and therapeutic right thoracentesis yielding 900 cc of pleural fluid. Read by: Rowe Robert, PA-C Electronically Signed   By: Jerilynn Mages.  Shick M.D.   On: 03/28/2019 11:44    Cardiac Studies   Echocardiogram 03/26/2019: 1. The left ventricle has moderate-severely reduced systolic function, with an ejection fraction of 30-35%. The cavity size was normal. There is mildly increased left ventricular wall thickness. Left ventricular diastolic function could not be evaluated secondary to atrial fibrillation. There is  pre-excitation of the interventricular septum. Left ventricular diffuse hypokinesis. 2. The right ventricle has mildly reduced systolic function. The cavity was mildly enlarged. There is no increase in right ventricular wall thickness. 3. Left atrial size was moderately dilated. 4. Right atrial size was mildly dilated. 5. Trivial pericardial effusion is present. 6. The mitral valve is abnormal. Mild thickening of the mitral valve leaflet. Mitral valve regurgitation is moderate by color flow Doppler. 7. The tricuspid valve was grossly normal. 8. The aortic valve is tricuspid Mild sclerosis of the aortic valve. Aortic valve regurgitation is trivial by color flow Doppler. No stenosis of the aortic valve. 9. The inferior vena cava was dilated in size with <50% respiratory variability.  Patient Profile     78 y.o. female with a history of COPD and tobacco abuse, hyperlipidemia, chronic back pain, admitted with pneumonia and newly documented rapid atrial fibrillation/flutter.  Assessment & Plan    1.  Atrial fibrillation/flutter, newly documented and presenting with RVR.  - CHA2DS2-VASc = 5 (age x 2, female,  CHF, CAD) - spont conversion to SR on diltiazem. Given low EF, would not use diltiazem again. - continue bisoprolol - on heparin, continue until possible cath, then can change to DOAC  2.  Newly documented cardiomyopathy with LVEF 30 to 35% and diffuse hypokinesis.  She also has mildly reduced right ventricular contraction. - continue BB  - admission weight 56.7 kg, peaked at 62.9, today 60.4 kg. However, charted as net negative 2.3 L (not congruent) - SBP improving gradually, more in the 110s. If remains here or higher, will attempt to start low dose ACEi - continue Lasix 20 mg IV bid - creatinine stable to improving at 0.82. K 4.1 (was hypokalemic earlier this admission). - we discussed r/lhc at length this AM. She wants answers but wants to think about the procedure. I did  discuss alternatives with her as well. She will think about it and let me know in a few hours. She appears much more comfortable today, I think we could do the procedure tomorrow if she wishes.  3.  Mild troponin I elevation and relatively flat pattern around 0.15 - Most likely demand ischemia 2nd CHF - however, atherosclerosis of thoracic aorta noted, coronary arteries not mentioned - with newly decreased EF, will need R/L heart cath  4..  Community-acquired pneumonia.  - COVID 19 negative - per IM  5. Pleural effusion - s/p thoracentesis w/ great improvement in CXR  - transudative by Light's criteria  TIME SPENT WITH PATIENT: >35 minutes of direct patient care. More than 50% of that time was spent on coordination of care and counseling regarding cath, indications, risks, benefits, alternatives.  Buford Dresser, MD, PhD [facility]   Signed, Buford Dresser, MD  03/30/2019, 8:54 AM

## 2019-03-31 ENCOUNTER — Encounter (HOSPITAL_COMMUNITY): Payer: Self-pay | Admitting: Cardiovascular Disease

## 2019-03-31 ENCOUNTER — Encounter (HOSPITAL_COMMUNITY)
Admission: EM | Disposition: A | Discharge status: Home Health | Payer: Self-pay | Source: Home / Self Care | Attending: Internal Medicine

## 2019-03-31 DIAGNOSIS — I5021 Acute systolic (congestive) heart failure: Secondary | ICD-10-CM | POA: Diagnosis present

## 2019-03-31 DIAGNOSIS — J181 Lobar pneumonia, unspecified organism: Secondary | ICD-10-CM

## 2019-03-31 DIAGNOSIS — J9601 Acute respiratory failure with hypoxia: Secondary | ICD-10-CM

## 2019-03-31 DIAGNOSIS — I2510 Atherosclerotic heart disease of native coronary artery without angina pectoris: Secondary | ICD-10-CM

## 2019-03-31 HISTORY — PX: RIGHT/LEFT HEART CATH AND CORONARY ANGIOGRAPHY: CATH118266

## 2019-03-31 LAB — POCT I-STAT 7, (LYTES, BLD GAS, ICA,H+H)
Acid-Base Excess: 17 mmol/L — ABNORMAL HIGH (ref 0.0–2.0)
Bicarbonate: 44.7 mmol/L — ABNORMAL HIGH (ref 20.0–28.0)
Calcium, Ion: 1.19 mmol/L (ref 1.15–1.40)
HCT: 33 % — ABNORMAL LOW (ref 36.0–46.0)
Hemoglobin: 11.2 g/dL — ABNORMAL LOW (ref 12.0–15.0)
O2 Saturation: 97 %
Potassium: 5.4 mmol/L — ABNORMAL HIGH (ref 3.5–5.1)
Sodium: 138 mmol/L (ref 135–145)
TCO2: 47 mmol/L — ABNORMAL HIGH (ref 22–32)
pCO2 arterial: 68.2 mmHg (ref 32.0–48.0)
pH, Arterial: 7.425 (ref 7.350–7.450)
pO2, Arterial: 90 mmHg (ref 83.0–108.0)

## 2019-03-31 LAB — CBC
HCT: 35.8 % — ABNORMAL LOW (ref 36.0–46.0)
Hemoglobin: 10.5 g/dL — ABNORMAL LOW (ref 12.0–15.0)
MCH: 28.6 pg (ref 26.0–34.0)
MCHC: 29.3 g/dL — ABNORMAL LOW (ref 30.0–36.0)
MCV: 97.5 fL (ref 80.0–100.0)
Platelets: 166 10*3/uL (ref 150–400)
RBC: 3.67 MIL/uL — ABNORMAL LOW (ref 3.87–5.11)
RDW: 17.4 % — ABNORMAL HIGH (ref 11.5–15.5)
WBC: 7.1 10*3/uL (ref 4.0–10.5)
nRBC: 0 % (ref 0.0–0.2)

## 2019-03-31 LAB — BASIC METABOLIC PANEL
Anion gap: 6 (ref 5–15)
BUN: 13 mg/dL (ref 8–23)
CO2: 39 mmol/L — ABNORMAL HIGH (ref 22–32)
Calcium: 8.8 mg/dL — ABNORMAL LOW (ref 8.9–10.3)
Chloride: 95 mmol/L — ABNORMAL LOW (ref 98–111)
Creatinine, Ser: 0.91 mg/dL (ref 0.44–1.00)
GFR calc Af Amer: >60 mL/min (ref >60)
GFR calc non Af Amer: >60 mL/min (ref >60)
Glucose, Bld: 123 mg/dL — ABNORMAL HIGH (ref 70–99)
Potassium: 5.2 mmol/L — ABNORMAL HIGH (ref 3.5–5.1)
Sodium: 140 mmol/L (ref 135–145)

## 2019-03-31 LAB — POCT I-STAT EG7
Acid-Base Excess: 14 mmol/L — ABNORMAL HIGH (ref 0.0–2.0)
Bicarbonate: 41.4 mmol/L — ABNORMAL HIGH (ref 20.0–28.0)
Calcium, Ion: 1.22 mmol/L (ref 1.15–1.40)
HCT: 33 % — ABNORMAL LOW (ref 36.0–46.0)
Hemoglobin: 11.2 g/dL — ABNORMAL LOW (ref 12.0–15.0)
O2 Saturation: 66 %
Potassium: 5.1 mmol/L (ref 3.5–5.1)
Sodium: 139 mmol/L (ref 135–145)
TCO2: 43 mmol/L — ABNORMAL HIGH (ref 22–32)
pCO2, Ven: 66.8 mmHg — ABNORMAL HIGH (ref 44.0–60.0)
pH, Ven: 7.4 (ref 7.250–7.430)
pO2, Ven: 36 mmHg (ref 32.0–45.0)

## 2019-03-31 LAB — HEPARIN LEVEL (UNFRACTIONATED): Heparin Unfractionated: 0.31 IU/mL (ref 0.30–0.70)

## 2019-03-31 SURGERY — RIGHT/LEFT HEART CATH AND CORONARY ANGIOGRAPHY
Anesthesia: LOCAL

## 2019-03-31 MED ORDER — LABETALOL HCL 5 MG/ML IV SOLN: 10.0000 mg | INTRAVENOUS | Status: AC | PRN

## 2019-03-31 MED ORDER — SODIUM CHLORIDE 0.9 % IV SOLN: INTRAVENOUS | Status: AC

## 2019-03-31 MED ORDER — SODIUM CHLORIDE 0.9 % IV SOLN: 250.0000 mL | INTRAVENOUS | Status: DC | PRN

## 2019-03-31 MED ORDER — FENTANYL CITRATE (PF) 100 MCG/2ML IJ SOLN
INTRAMUSCULAR | Status: DC | PRN
  Administered 2019-03-31 (×2): 12.5 ug via INTRAVENOUS

## 2019-03-31 MED ORDER — HEPARIN (PORCINE) IN NACL 1000-0.9 UT/500ML-% IV SOLN
INTRAVENOUS | Status: DC | PRN
  Administered 2019-03-31 (×2): 500 mL

## 2019-03-31 MED ORDER — MIDAZOLAM HCL 2 MG/2ML IJ SOLN
INTRAMUSCULAR | Status: AC
  Filled 2019-03-31: qty 2

## 2019-03-31 MED ORDER — FENTANYL CITRATE (PF) 100 MCG/2ML IJ SOLN
INTRAMUSCULAR | Status: AC
  Filled 2019-03-31: qty 2

## 2019-03-31 MED ORDER — HYDRALAZINE HCL 20 MG/ML IJ SOLN: 10.0000 mg | INTRAMUSCULAR | Status: AC | PRN

## 2019-03-31 MED ORDER — VERAPAMIL HCL 2.5 MG/ML IV SOLN
INTRAVENOUS | Status: DC | PRN
  Administered 2019-03-31: 10 mL via INTRA_ARTERIAL

## 2019-03-31 MED ORDER — LIDOCAINE HCL (PF) 1 % IJ SOLN
INTRAMUSCULAR | Status: AC
  Filled 2019-03-31: qty 30

## 2019-03-31 MED ORDER — MIDAZOLAM HCL 2 MG/2ML IJ SOLN
INTRAMUSCULAR | Status: DC | PRN
  Administered 2019-03-31 (×2): 0.5 mg via INTRAVENOUS

## 2019-03-31 MED ORDER — IOHEXOL 350 MG/ML SOLN
INTRAVENOUS | Status: DC | PRN
  Administered 2019-03-31: 40 mL via INTRACARDIAC

## 2019-03-31 MED ORDER — HEPARIN SODIUM (PORCINE) 1000 UNIT/ML IJ SOLN
INTRAMUSCULAR | Status: AC
  Filled 2019-03-31: qty 1

## 2019-03-31 MED ORDER — FUROSEMIDE 20 MG PO TABS
20.0000 mg | ORAL_TABLET | Freq: Every day | ORAL | Status: DC
  Administered 2019-04-01 – 2019-04-02 (×2): 20 mg via ORAL
  Filled 2019-03-31 (×2): qty 1

## 2019-03-31 MED ORDER — SACCHAROMYCES BOULARDII 250 MG PO CAPS
250.0000 mg | ORAL_CAPSULE | Freq: Two times a day (BID) | ORAL | Status: DC
  Administered 2019-03-31 – 2019-04-02 (×5): 250 mg via ORAL
  Filled 2019-03-31 (×5): qty 1

## 2019-03-31 MED ORDER — VERAPAMIL HCL 2.5 MG/ML IV SOLN
INTRAVENOUS | Status: AC
  Filled 2019-03-31: qty 2

## 2019-03-31 MED ORDER — SODIUM CHLORIDE 0.9% FLUSH
3.0000 mL | Freq: Two times a day (BID) | INTRAVENOUS | Status: DC
  Administered 2019-03-31 – 2019-04-02 (×3): 3 mL via INTRAVENOUS

## 2019-03-31 MED ORDER — APIXABAN 5 MG PO TABS
5.0000 mg | ORAL_TABLET | Freq: Two times a day (BID) | ORAL | Status: DC
  Administered 2019-04-01 – 2019-04-02 (×3): 5 mg via ORAL
  Filled 2019-03-31 (×3): qty 1

## 2019-03-31 MED ORDER — SODIUM CHLORIDE 0.9% FLUSH: 3.0000 mL | INTRAVENOUS | Status: DC | PRN

## 2019-03-31 MED ORDER — HEPARIN (PORCINE) IN NACL 1000-0.9 UT/500ML-% IV SOLN
INTRAVENOUS | Status: AC
  Filled 2019-03-31: qty 1000

## 2019-03-31 MED ORDER — LIDOCAINE HCL (PF) 1 % IJ SOLN
INTRAMUSCULAR | Status: DC | PRN
  Administered 2019-03-31: 2 mL
  Administered 2019-03-31: 15 mL
  Administered 2019-03-31: 2 mL

## 2019-03-31 MED ORDER — ONDANSETRON HCL 4 MG/2ML IJ SOLN: 4.0000 mg | Freq: Four times a day (QID) | INTRAMUSCULAR | Status: DC | PRN

## 2019-03-31 SURGICAL SUPPLY — 16 items
CATH BALLN WEDGE 5F 110CM (CATHETERS) ×1 IMPLANT
CATH INFINITI 5FR MULTPACK ANG (CATHETERS) ×1 IMPLANT
CLOSURE MYNX CONTROL 5F (Vascular Products) ×1 IMPLANT
COVER DOME SNAP 22 D (MISCELLANEOUS) ×1 IMPLANT
DEVICE RAD TR BAND REGULAR (VASCULAR PRODUCTS) ×1 IMPLANT
GLIDESHEATH SLEND SS 6F .021 (SHEATH) ×1 IMPLANT
GUIDEWIRE .025 260CM (WIRE) ×1 IMPLANT
GUIDEWIRE INQWIRE 1.5J.035X260 (WIRE) IMPLANT
INQWIRE 1.5J .035X260CM (WIRE) ×2
KIT HEART LEFT (KITS) ×2 IMPLANT
PACK CARDIAC CATHETERIZATION (CUSTOM PROCEDURE TRAY) ×2 IMPLANT
SHEATH GLIDE SLENDER 4/5FR (SHEATH) ×1 IMPLANT
SHEATH PINNACLE 5F 10CM (SHEATH) ×1 IMPLANT
TRANSDUCER W/STOPCOCK (MISCELLANEOUS) ×2 IMPLANT
TUBING CIL FLEX 10 FLL-RA (TUBING) ×2 IMPLANT
WIRE EMERALD 3MM-J .035X150CM (WIRE) ×1 IMPLANT

## 2019-03-31 NOTE — Interval H&P Note (Signed)
History and Physical Interval Note:  03/31/2019 10:58 AM  Misty [name]  has presented today for surgery, with the diagnosis of n stemi.  The various methods of treatment have been discussed with the patient and family. After consideration of risks, benefits and other options for treatment, the patient has consented to  Procedure(s): RIGHT/LEFT HEART CATH AND CORONARY ANGIOGRAPHY (N/A) as a surgical intervention.  The patient's history has been reviewed, patient examined, no change in status, stable for surgery.  I have reviewed the patient's chart and labs.  Questions were answered to the patient's satisfaction.     Sherren Mocha

## 2019-03-31 NOTE — H&P (View-Only) (Signed)
Progress Note  Patient Name: Misty [name] Date of Encounter: 03/31/2019  Primary Cardiologist: Mertie Moores, MD  Subjective   Breathing feels somewhat better. Still "peeing all the time," has had several bowel movements but no abdominal pain or dysuria. Reviewed plans for cath today, she is amenable.   Inpatient Medications    Scheduled Meds: . ALPRAZolam  0.5 mg Oral TID  . bisoprolol  5 mg Oral Daily  . docusate sodium  200 mg Oral BID  . escitalopram  20 mg Oral QHS  . feeding supplement (ENSURE ENLIVE)  237 mL Oral BID BM  . ferrous [id number] C-folic acid  1 capsule Oral QAC breakfast  . furosemide  20 mg Intravenous BID  . lidocaine  1 patch Transdermal Q24H  . multivitamin with minerals  1 tablet Oral Daily  . nystatin-triamcinolone ointment  1 application Topical BID  . pantoprazole  40 mg Oral BID AC  . sodium chloride flush  3 mL Intravenous Q12H  . sodium chloride flush  3 mL Intravenous Q12H  . vitamin B-12  1,000 mcg Oral Daily   Continuous Infusions: . sodium chloride    . sodium chloride    . sodium chloride 10 mL/hr at 03/31/19 0615  . cefTRIAXone (ROCEPHIN)  IV 2 g (03/30/19 0948)  . heparin 900 Units/hr (03/30/19 1528)   PRN Meds: sodium chloride, sodium chloride, acetaminophen **OR** acetaminophen, albuterol, ipratropium-albuterol, magic mouthwash w/lidocaine, sodium chloride flush, sodium chloride flush   Vital Signs    Vitals:   03/30/19 1939 03/30/19 2349 03/31/19 0409 03/31/19 0754  BP: (!) 106/56 112/69 107/60 (!) 106/56  Pulse: 76 79 76 79  Resp:      Temp: 98 F (36.7 C) 98.2 F (36.8 C) 97.7 F (36.5 C) 98.5 F (36.9 C)  TempSrc: Oral Oral Oral Oral  SpO2: 91% 96% 95% (!) 89%  Weight:   59.8 kg   Height:        Intake/Output Summary (Last 24 hours) at 03/31/2019 0945 Last data filed at 03/31/2019 0814 Gross per 24 hour  Intake 455.96 ml  Output 2100 ml  Net -1644.04 ml   Filed Weights   03/29/19 0412 03/30/19  0411 03/31/19 0409  Weight: 62.9 kg 60.4 kg 59.8 kg    Telemetry    SR - Personally reviewed.  ECG    No new since 5/7- personally reviewed.  Physical Exam   General: Well developed, frail elderly, female in no acute distress Head: EOMi.   Normocephalic and atraumatic Lungs:  Basilar rales improved, clear in rest of lung fields. Heart: HRRR S1 S2, without MRG.  Pulses are 2+ & equal. JVD now right at clavicle at 45 degrees, much improved from when it was up to her ear. About 8-9 cm. Abdomen: Bowel sounds are present, abdomen soft and non-tender without masses or  hernias noted. Msk: Normal strength and tone for age. Extremities: No clubbing, cyanosis or edema.    Skin:  No rashes or lesions noted. Neuro: Alert and oriented X 3. Psych:  Good affect, responds appropriately  Labs    Chemistry Recent Labs  Lab 03/29/19 0536 03/30/19 0345 03/31/19 0403  NA 141 143 140  K 3.8 4.1 5.2*  CL 93* 91* 95*  CO2 38* 39* 39*  GLUCOSE 118* 112* 123*  BUN 18 16 13   CREATININE 0.88 0.82 0.91  CALCIUM 8.5* 8.7* 8.8*  GFRNONAA >60 >60 >60  GFRAA >60 >60 >60  ANIONGAP 10 13 6  Hematology Recent Labs  Lab 03/29/19 0536 03/30/19 0345 03/31/19 0403  WBC 6.9 6.4 7.1  RBC 3.60* 3.45* 3.67*  HGB 10.4* 9.8* 10.5*  HCT 34.8* 33.5* 35.8*  MCV 96.7 97.1 97.5  MCH 28.9 28.4 28.6  MCHC 29.9* 29.3* 29.3*  RDW 17.2* 17.4* 17.4*  PLT 169 156 166    Cardiac Enzymes Recent Labs  Lab 03/25/19 2303 03/26/19 0932 03/26/19 1458 03/26/19 2054  TROPONINI 0.11* 0.15* 0.15* 0.14*    BNP Recent Labs  Lab 03/25/19 2033  BNP >4,500.0*     DDimer  Recent Labs  Lab 03/25/19 2122  DDIMER 1.69*     Radiology    No results found.  Cardiac Studies   Echocardiogram 03/26/2019: 1. The left ventricle has moderate-severely reduced systolic function, with an ejection fraction of 30-35%. The cavity size was normal. There is mildly increased left ventricular wall thickness. Left  ventricular diastolic function could not be evaluated secondary to atrial fibrillation. There is pre-excitation of the interventricular septum. Left ventricular diffuse hypokinesis. 2. The right ventricle has mildly reduced systolic function. The cavity was mildly enlarged. There is no increase in right ventricular wall thickness. 3. Left atrial size was moderately dilated. 4. Right atrial size was mildly dilated. 5. Trivial pericardial effusion is present. 6. The mitral valve is abnormal. Mild thickening of the mitral valve leaflet. Mitral valve regurgitation is moderate by color flow Doppler. 7. The tricuspid valve was grossly normal. 8. The aortic valve is tricuspid Mild sclerosis of the aortic valve. Aortic valve regurgitation is trivial by color flow Doppler. No stenosis of the aortic valve. 9. The inferior vena cava was dilated in size with <50% respiratory variability.  Patient Profile     78 y.o. female with a history of COPD and tobacco abuse, hyperlipidemia, chronic back pain, admitted with pneumonia and newly documented rapid atrial fibrillation/flutter. Found to have newly reduced EF consistent with acute systolic heart failure.  Assessment & Plan    1.  Atrial fibrillation/flutter, newly documented and presenting with RVR.  - CHA2DS2-VASc = 5 (age x 2, female, CHF, CAD) - spont conversion to SR on diltiazem. Given low EF, would not use diltiazem again. - continue bisoprolol - on heparin, continue until cath, then can change to DOAC  2.  Newly documented cardiomyopathy with LVEF 30 to 35% and diffuse hypokinesis.  She also has mildly reduced right ventricular contraction. - continue BB  - admission weight 56.7 kg, peaked at 62.9, today 59.8 kg. Charted as net negative 3.9 L. Physical exam continues to improve. - SBP improving, but hyperkalemic mildly this AM. Stopped potassium repletion (she was very hypokalemic earlier this admission), suspect potassium will decrease  with continued diuresis. Will potentially trial low dose ACEi tomorrow AM. - continue Lasix 20 mg IV bid - creatinine 0.91 today - planned for [facility] today to evaluate new cardiomyopathy.  3.  Mild troponin I elevation and relatively flat pattern around 0.15 - Most likely demand ischemia 2nd CHF - however, atherosclerosis of thoracic aorta noted, coronary arteries not mentioned - with newly decreased EF, purusing R/L heart cath  4..  Community-acquired pneumonia.  - COVID 19 negative - per IM  5. Pleural effusion - s/p thoracentesis w/ great improvement in CXR  - transudative by Light's criteria, likely 2/2 HF.  TIME SPENT WITH PATIENT: >25 minutes of direct patient care. More than 50% of that time was spent on coordination of care and counseling regarding how we will use information from cath, medical  management plans.  Buford Dresser, MD, PhD [facility]   Signed, Buford Dresser, MD  03/31/2019, 9:45 AM

## 2019-03-31 NOTE — Progress Notes (Signed)
Progress Note  Patient Name: Misty [name] Date of Encounter: 03/31/2019  Primary Cardiologist: Mertie Moores, MD  Subjective   Breathing feels somewhat better. Still "peeing all the time," has had several bowel movements but no abdominal pain or dysuria. Reviewed plans for cath today, she is amenable.   Inpatient Medications    Scheduled Meds: . ALPRAZolam  0.5 mg Oral TID  . bisoprolol  5 mg Oral Daily  . docusate sodium  200 mg Oral BID  . escitalopram  20 mg Oral QHS  . feeding supplement (ENSURE ENLIVE)  237 mL Oral BID BM  . ferrous [id number] C-folic acid  1 capsule Oral QAC breakfast  . furosemide  20 mg Intravenous BID  . lidocaine  1 patch Transdermal Q24H  . multivitamin with minerals  1 tablet Oral Daily  . nystatin-triamcinolone ointment  1 application Topical BID  . pantoprazole  40 mg Oral BID AC  . sodium chloride flush  3 mL Intravenous Q12H  . sodium chloride flush  3 mL Intravenous Q12H  . vitamin B-12  1,000 mcg Oral Daily   Continuous Infusions: . sodium chloride    . sodium chloride    . sodium chloride 10 mL/hr at 03/31/19 0615  . cefTRIAXone (ROCEPHIN)  IV 2 g (03/30/19 0948)  . heparin 900 Units/hr (03/30/19 1528)   PRN Meds: sodium chloride, sodium chloride, acetaminophen **OR** acetaminophen, albuterol, ipratropium-albuterol, magic mouthwash w/lidocaine, sodium chloride flush, sodium chloride flush   Vital Signs    Vitals:   03/30/19 1939 03/30/19 2349 03/31/19 0409 03/31/19 0754  BP: (!) 106/56 112/69 107/60 (!) 106/56  Pulse: 76 79 76 79  Resp:      Temp: 98 F (36.7 C) 98.2 F (36.8 C) 97.7 F (36.5 C) 98.5 F (36.9 C)  TempSrc: Oral Oral Oral Oral  SpO2: 91% 96% 95% (!) 89%  Weight:   59.8 kg   Height:        Intake/Output Summary (Last 24 hours) at 03/31/2019 0945 Last data filed at 03/31/2019 0814 Gross per 24 hour  Intake 455.96 ml  Output 2100 ml  Net -1644.04 ml   Filed Weights   03/29/19 0412 03/30/19  0411 03/31/19 0409  Weight: 62.9 kg 60.4 kg 59.8 kg    Telemetry    SR - Personally reviewed.  ECG    No new since 5/7- personally reviewed.  Physical Exam   General: Well developed, frail elderly, female in no acute distress Head: EOMi.   Normocephalic and atraumatic Lungs:  Basilar rales improved, clear in rest of lung fields. Heart: HRRR S1 S2, without MRG.  Pulses are 2+ & equal. JVD now right at clavicle at 45 degrees, much improved from when it was up to her ear. About 8-9 cm. Abdomen: Bowel sounds are present, abdomen soft and non-tender without masses or  hernias noted. Msk: Normal strength and tone for age. Extremities: No clubbing, cyanosis or edema.    Skin:  No rashes or lesions noted. Neuro: Alert and oriented X 3. Psych:  Good affect, responds appropriately  Labs    Chemistry Recent Labs  Lab 03/29/19 0536 03/30/19 0345 03/31/19 0403  NA 141 143 140  K 3.8 4.1 5.2*  CL 93* 91* 95*  CO2 38* 39* 39*  GLUCOSE 118* 112* 123*  BUN 18 16 13   CREATININE 0.88 0.82 0.91  CALCIUM 8.5* 8.7* 8.8*  GFRNONAA >60 >60 >60  GFRAA >60 >60 >60  ANIONGAP 10 13 6  Hematology Recent Labs  Lab 03/29/19 0536 03/30/19 0345 03/31/19 0403  WBC 6.9 6.4 7.1  RBC 3.60* 3.45* 3.67*  HGB 10.4* 9.8* 10.5*  HCT 34.8* 33.5* 35.8*  MCV 96.7 97.1 97.5  MCH 28.9 28.4 28.6  MCHC 29.9* 29.3* 29.3*  RDW 17.2* 17.4* 17.4*  PLT 169 156 166    Cardiac Enzymes Recent Labs  Lab 03/25/19 2303 03/26/19 0932 03/26/19 1458 03/26/19 2054  TROPONINI 0.11* 0.15* 0.15* 0.14*    BNP Recent Labs  Lab 03/25/19 2033  BNP >4,500.0*     DDimer  Recent Labs  Lab 03/25/19 2122  DDIMER 1.69*     Radiology    No results found.  Cardiac Studies   Echocardiogram 03/26/2019: 1. The left ventricle has moderate-severely reduced systolic function, with an ejection fraction of 30-35%. The cavity size was normal. There is mildly increased left ventricular wall thickness. Left  ventricular diastolic function could not be evaluated secondary to atrial fibrillation. There is pre-excitation of the interventricular septum. Left ventricular diffuse hypokinesis. 2. The right ventricle has mildly reduced systolic function. The cavity was mildly enlarged. There is no increase in right ventricular wall thickness. 3. Left atrial size was moderately dilated. 4. Right atrial size was mildly dilated. 5. Trivial pericardial effusion is present. 6. The mitral valve is abnormal. Mild thickening of the mitral valve leaflet. Mitral valve regurgitation is moderate by color flow Doppler. 7. The tricuspid valve was grossly normal. 8. The aortic valve is tricuspid Mild sclerosis of the aortic valve. Aortic valve regurgitation is trivial by color flow Doppler. No stenosis of the aortic valve. 9. The inferior vena cava was dilated in size with <50% respiratory variability.  Patient Profile     78 y.o. female with a history of COPD and tobacco abuse, hyperlipidemia, chronic back pain, admitted with pneumonia and newly documented rapid atrial fibrillation/flutter. Found to have newly reduced EF consistent with acute systolic heart failure.  Assessment & Plan    1.  Atrial fibrillation/flutter, newly documented and presenting with RVR.  - CHA2DS2-VASc = 5 (age x 2, female, CHF, CAD) - spont conversion to SR on diltiazem. Given low EF, would not use diltiazem again. - continue bisoprolol - on heparin, continue until cath, then can change to DOAC  2.  Newly documented cardiomyopathy with LVEF 30 to 35% and diffuse hypokinesis.  She also has mildly reduced right ventricular contraction. - continue BB  - admission weight 56.7 kg, peaked at 62.9, today 59.8 kg. Charted as net negative 3.9 L. Physical exam continues to improve. - SBP improving, but hyperkalemic mildly this AM. Stopped potassium repletion (she was very hypokalemic earlier this admission), suspect potassium will decrease  with continued diuresis. Will potentially trial low dose ACEi tomorrow AM. - continue Lasix 20 mg IV bid - creatinine 0.91 today - planned for [facility] today to evaluate new cardiomyopathy.  3.  Mild troponin I elevation and relatively flat pattern around 0.15 - Most likely demand ischemia 2nd CHF - however, atherosclerosis of thoracic aorta noted, coronary arteries not mentioned - with newly decreased EF, purusing R/L heart cath  4..  Community-acquired pneumonia.  - COVID 19 negative - per IM  5. Pleural effusion - s/p thoracentesis w/ great improvement in CXR  - transudative by Light's criteria, likely 2/2 HF.  TIME SPENT WITH PATIENT: >25 minutes of direct patient care. More than 50% of that time was spent on coordination of care and counseling regarding how we will use information from cath, medical  management plans.  Buford Dresser, MD, PhD [facility]   Signed, Buford Dresser, MD  03/31/2019, 9:45 AM

## 2019-03-31 NOTE — Progress Notes (Addendum)
PROGRESS NOTE  Misty [name] GNO:[id number] DOB: [birth year] DOA: 03/25/2019 PCP: Chipper Herb, MD  HPI/Recap of past 24 hours:  Had cardiac cath yesterday recommended medical management, she is off heparin drip, eliquis started Has loose stool, Denies ab pain, no fever She remains oxygen dependent though no cough, edema has resolved   Assessment/Plan: Principal Problem:   Atrial fibrillation with RVR ([place]) Active Problems:   GERD (gastroesophageal reflux disease)   Malnutrition of moderate degree   Hypokalemia   Anxiety and depression   Iron deficiency anemia   S/P IM nail left hip 01/07/2019   Elevated troponin   Community acquired pneumonia   Acute systolic heart failure ([place])   Atrial fibrillation with RVR -new onset.- BNP> 4500, d-dimer 1.69 on presentation. - CT angiogram negative for pulmonary embolism but large right pleural effusion.   -TSH was 2.2.  - 2D echocardiogram showed decreased ejection fraction with EF of 30 to 35% with diffuse hypokinesis.   -Patient was initially put on Cardizem and heparin drip, subsequently converted to normal sinus rhythm.  - Currently on bisoprolol., apixaban - Cardiology on board, input appreciated  Acute systolic CHF/cardiomyopathywith right pleural effusion -Status post ultrasound guided diagnostic and therapeutic thoracocentesis with removal of 900 mL of fluid on 5/10.   Gram Stain of the pleural fluid shows rare WBC no organisms, fluid culture negative .   Transudative pleural effusion likely from CHF. - EF 30 to 35% likely precipitated due to acute A. fib with RVR.  -initially on Lasix 20 mg IV twice daily., now on oral lasix 45m daily, 5liter negative so far -remain oxygen dependent . Mild troponin I elevation and relatively flat pattern around 0.15 - Most likely demand ischemia 2nd CHF, especially given nonobstructive CAD on cath. - no aspirin given DOAC. LDL 43, Tchol 89 on no meds -patient declined statin  -appreciate cardiology input   community-acquired pneumonia secondary to Streptococcus pneumonia. H/o COPD/pulmonary fibrosis, home o2 dependent per chart, patient /Regis Billdenies Urine Streptococcus pneumoniae antigen positive.   COVID-19 was negative.   Blood cultures negative in 5 days. treated with IV Rocephin., last dose on 5/13   Acute hypoxic respiratory failure Likely due to heart failure, pna and underline copd/pulmonary fibrosis She is discharged on home 02, follow up with pcp to decide duration of home o2, suspect will need it long term   GERD (gastroesophageal reflux disease) Continue Protonix  Moderate protein calorie malnutrition. Continue nutritional supplements.  Hypokalemia Improved.  Closely monitor with BMP.  Anxiety and depression Continue Xanax  History of iron deficiency anemia -H&H stable, at baseline.  Hemoglobin of 9.8 today.  New mild compression deformity of T10 vertebral body is noted of indeterminate age. Incidental finding on CTA chest on admission, she denies pain H/o back surgery and hip surgery H/o incidental fall in 12/2018 resulted in left hip fracture required surgery  FTT:  PT recommended home health   Code Status: full  Family Communication: patient , daughter in law DHilda Bladesupdated over the phone with patient's permission  Disposition Plan: home with home health, may need home o2 pending on ambulating o2, needs cardiology clearance   Consultants:  cardiology  Procedures:   Cardiac cath on 5/13  Right sided thoracentesis on 5/10  Antibiotics:  Rocephin, last dose on 5/13   Objective: BP (!) 106/56 (BP Location: Right Arm)   Pulse 79   Temp 98.5 F (36.9 C) (Oral)   Resp (!) 25   Ht 5' 7"  (1.702  m)   Wt 59.8 kg   SpO2 (!) 89%   BMI 20.65 kg/m   Intake/Output Summary (Last 24 hours) at 03/31/2019 1010 Last data filed at 03/31/2019 1000 Gross per 24 hour  Intake 455.96 ml  Output 3400 ml  Net  -2944.04 ml   Filed Weights   03/29/19 0412 03/30/19 0411 03/31/19 0409  Weight: 62.9 kg 60.4 kg 59.8 kg    Exam: Patient is examined daily including today on 03/31/2019, exams remain the same as of yesterday except that has changed    General:  Frail, NAD  Cardiovascular: RRR  Respiratory: diminished, no wheezing, no rhonchi, no rales  Abdomen: Soft/ND/NT, positive BS  Musculoskeletal: No Edema  Neuro: alert, oriented   Data Reviewed: Basic Metabolic Panel: Recent Labs  Lab 03/25/19 2033 03/27/19 0341 03/27/19 0713 03/27/19 1322 03/28/19 0523 03/29/19 0536 03/30/19 0345 03/31/19 0403  NA 142 140  --   --  138 141 143 140  K 2.9* 2.4*  --  3.1* 3.6 3.8 4.1 5.2*  CL 95* 96*  --   --  91* 93* 91* 95*  CO2 24 34*  --   --  36* 38* 39* 39*  GLUCOSE 230* 122*  --   --  120* 118* 112* 123*  BUN 21 24*  --   --  21 18 16 13   CREATININE 1.21* 0.92  --   --  1.01* 0.88 0.82 0.91  CALCIUM 8.6* 8.0*  --   --  8.4* 8.5* 8.7* 8.8*  MG 2.1  --  1.8  --   --   --   --   --    Liver Function Tests: No results for input(s): AST, ALT, ALKPHOS, BILITOT, PROT, ALBUMIN in the last 168 hours. No results for input(s): LIPASE, AMYLASE in the last 168 hours. No results for input(s): AMMONIA in the last 168 hours. CBC: Recent Labs  Lab 03/25/19 2033  03/27/19 0341 03/28/19 0523 03/29/19 0536 03/30/19 0345 03/31/19 0403  WBC 9.3   < > 7.2 8.3 6.9 6.4 7.1  NEUTROABS 6.2  --   --   --   --   --   --   HGB 12.7   < > 10.3* 10.1* 10.4* 9.8* 10.5*  HCT 43.2   < > 33.4* 34.1* 34.8* 33.5* 35.8*  MCV 97.7   < > 92.5 95.5 96.7 97.1 97.5  PLT 285   < > 201 194 169 156 166   < > = values in this interval not displayed.   Cardiac Enzymes:   Recent Labs  Lab 03/25/19 2033 03/25/19 2303 03/26/19 0932 03/26/19 1458 03/26/19 2054  TROPONINI 0.14* 0.11* 0.15* 0.15* 0.14*   BNP (last 3 results) Recent Labs    03/25/19 2033  BNP >4,500.0*    ProBNP (last 3 results) No results for  input(s): PROBNP in the last 8760 hours.  CBG: No results for input(s): GLUCAP in the last 168 hours.  Recent Results (from the past 240 hour(s))  SARS Coronavirus 2 (CEPHEID- Performed in [place] hospital lab), Hosp Order     Status: None   Collection Time: 03/25/19  8:58 PM  Result Value Ref Range Status   SARS Coronavirus 2 NEGATIVE NEGATIVE Final    Comment: (NOTE) If result is NEGATIVE SARS-CoV-2 target nucleic acids are NOT DETECTED. The SARS-CoV-2 RNA is generally detectable in upper and lower  respiratory specimens during the acute phase of infection. The lowest  concentration of SARS-CoV-2 viral copies this  assay can detect is 250  copies / mL. A negative result does not preclude SARS-CoV-2 infection  and should not be used as the sole basis for treatment or other  patient management decisions.  A negative result may occur with  improper specimen collection / handling, submission of specimen other  than nasopharyngeal swab, presence of viral mutation(s) within the  areas targeted by this assay, and inadequate number of viral copies  (<250 copies / mL). A negative result must be combined with clinical  observations, patient history, and epidemiological information. If result is POSITIVE SARS-CoV-2 target nucleic acids are DETECTED. The SARS-CoV-2 RNA is generally detectable in upper and lower  respiratory specimens dur ing the acute phase of infection.  Positive  results are indicative of active infection with SARS-CoV-2.  Clinical  correlation with patient history and other diagnostic information is  necessary to determine patient infection status.  Positive results do  not rule out bacterial infection or co-infection with other viruses. If result is PRESUMPTIVE POSTIVE SARS-CoV-2 nucleic acids MAY BE PRESENT.   A presumptive positive result was obtained on the submitted specimen  and confirmed on repeat testing.  While 2019 novel coronavirus  (SARS-CoV-2) nucleic  acids may be present in the submitted sample  additional confirmatory testing may be necessary for epidemiological  and / or clinical management purposes  to differentiate between  SARS-CoV-2 and other Sarbecovirus currently known to infect humans.  If clinically indicated additional testing with an alternate test  methodology [id number]) is advised. The SARS-CoV-2 RNA is generally  detectable in upper and lower respiratory sp ecimens during the acute  phase of infection. The expected result is Negative. Fact Sheet for Patients:  StrictlyIdeas.no Fact Sheet for Healthcare Providers: BankingDealers.co.za This test is not yet approved or cleared by the [place] FDA and has been authorized for detection and/or diagnosis of SARS-CoV-2 by FDA under an Emergency Use Authorization (EUA).  This EUA will remain in effect (meaning this test can be used) for the duration of the COVID-19 declaration under Section 564(b)(1) of the Act, 21 U.S.C. section 360bbb-3(b)(1), unless the authorization is terminated or revoked sooner. Performed at [facility], [address] [place], [place] [address]   Culture, blood (routine x 2)     Status: None   Collection Time: 03/25/19  9:22 PM  Result Value Ref Range Status   Specimen Description BLOOD LEFT ANTECUBITAL  Final   Special Requests   Final    BOTTLES DRAWN AEROBIC AND ANAEROBIC Blood Culture adequate volume   Culture   Final    NO GROWTH 5 DAYS Performed at [facility], [address] [place], [place] [address]    Report Status 03/30/2019 FINAL  Final  Culture, blood (routine x 2)     Status: None   Collection Time: 03/25/19  9:23 PM  Result Value Ref Range Status   Specimen Description BLOOD BLOOD LEFT WRIST  Final   Special Requests   Final    BOTTLES DRAWN AEROBIC AND ANAEROBIC Blood Culture adequate volume   Culture   Final    NO GROWTH 5 DAYS Performed at [facility], [address], [place], [place] [address]    Report Status 03/30/2019 FINAL  Final  MRSA PCR Screening     Status: None   Collection Time: 03/27/19 11:43 PM  Result Value Ref Range Status   MRSA by PCR NEGATIVE NEGATIVE Final    Comment:        The GeneXpert MRSA  Assay (FDA approved for NASAL specimens only), is one component of a comprehensive MRSA colonization surveillance program. It is not intended to diagnose MRSA infection nor to guide or monitor treatment for MRSA infections. Performed at [facility], [place] [address], [place], [place] [address]   Culture, body fluid-bottle     Status: None (Preliminary result)   Collection Time: 03/28/19 11:42 AM  Result Value Ref Range Status   Specimen Description PLEURAL RIGHT  Final   Special Requests NONE  Final   Culture   Final    NO GROWTH 2 DAYS Performed at [facility], [place] [address], [place], [place] [address]    Report Status PENDING  Incomplete  Gram stain     Status: None   Collection Time: 03/28/19 11:42 AM  Result Value Ref Range Status   Specimen Description PLEURAL RIGHT  Final   Special Requests NONE  Final   Gram Stain   Final    RARE WBC PRESENT, PREDOMINANTLY PMN NO ORGANISMS SEEN Performed at [facility], [place] [address] [place], [place] [address]    Report Status 03/28/2019 FINAL  Final     Studies: No results found.  Scheduled Meds: . ALPRAZolam  0.5 mg Oral TID  . bisoprolol  5 mg Oral Daily  . docusate sodium  200 mg Oral BID  . escitalopram  20 mg Oral QHS  . feeding supplement (ENSURE ENLIVE)  237 mL Oral BID BM  . ferrous [id number] C-folic acid  1 capsule Oral QAC breakfast  . furosemide  20 mg Intravenous BID  . lidocaine  1 patch Transdermal Q24H  . multivitamin with minerals  1 tablet Oral Daily  . nystatin-triamcinolone ointment  1 application Topical BID  . pantoprazole  40 mg Oral BID AC  . sodium chloride flush  3 mL Intravenous Q12H  . sodium chloride flush  3 mL  Intravenous Q12H  . vitamin B-12  1,000 mcg Oral Daily    Continuous Infusions: . sodium chloride    . sodium chloride    . sodium chloride 10 mL/hr at 03/31/19 0615  . cefTRIAXone (ROCEPHIN)  IV 2 g (03/31/19 0953)  . heparin 900 Units/hr (03/30/19 1528)     Time spent: 41mns I have personally reviewed and interpreted on  03/31/2019 daily labs, tele strips, imagings as discussed above under date review session and assessment and plans.  I reviewed all nursing notes, pharmacy notes, consultant notes,  vitals, pertinent old records  I have discussed plan of care as described above with RN , patient and family on 03/31/2019   FFlorencia ReasonsMD, PhD  [facility] Pager [phone] If 7PM-7AM, please contact night-coverage at [email or web address], password [facility]5/13/2020, 10:10 AM  LOS: 6 days

## 2019-03-31 NOTE — Progress Notes (Signed)
ANTICOAGULATION CONSULT NOTE - Follow Up Consult  Pharmacy Consult for Heparin Indication: atrial fibrillation  Allergies  Allergen Reactions  . Codeine Nausea And Vomiting  . Tramadol Nausea And Vomiting  . Asa [Aspirin] Other (See Comments)    Patient is unaware of allergy  . Azithromycin Other (See Comments)    Patient is unaware of allergy  . Celebrex [Celecoxib] Other (See Comments)    Irritated stomach   . Cymbalta [Duloxetine Hcl] Other (See Comments)    Felt groggy  . Prednisone Rash    She has seen the podiatrist and he had injected cortisone in her feet and she had also taken a peel at home. She had a severe reaction to her face with a rash and had to take Benadryl to resolve the symptom. She actually did get more cortisone shots, but no additional reactions to the shots.  . Vioxx [Rofecoxib] Other (See Comments)    Unknown  . Zelnorm [Tegaserod] Other (See Comments)    Patient is unaware of allergy  . Zocor [Simvastatin] Other (See Comments)    Patient is unaware of allergy    Patient Measurements: Height: 5\' 7"  (170.2 cm) Weight: 131 lb 13.4 oz (59.8 kg) IBW/kg (Calculated) : 61.6 Heparin Dosing Weight:  60.4 kg  Vital Signs: Temp: 98.5 F (36.9 C) (05/13 0754) Temp Source: Oral (05/13 0754) BP: 106/56 (05/13 0754) Pulse Rate: 79 (05/13 0754)  Labs: Recent Labs    03/29/19 0536 03/30/19 0345 03/31/19 0403  HGB 10.4* 9.8* 10.5*  HCT 34.8* 33.5* 35.8*  PLT 169 156 166  HEPARINUNFRC 0.36 0.47 0.31  CREATININE 0.88 0.82 0.91    Estimated Creatinine Clearance: 48.9 mL/min (by C-G formula based on SCr of 0.91 mg/dL).   Assessment:  Anticoag: new afib/flutter - on heparin. CHA2DS2-VASc = 5. HL 0.31 in goal. Hgb 10.5 low but stable. Plts stable. - Heparin held 5/10 am for thoracentesis  Goal of Therapy:  Heparin level 0.3-0.7 units/ml Monitor platelets by anticoagulation protocol: Yes   Plan:  Continue heparin at 900 units/hr Daily HL, CBC Cath  5/13  Nicholes Hibler S. Merilynn Finland, PharmD, BCPS Clinical Staff Pharmacist Misty Stanley Stillinger 03/31/2019,10:00 AM

## 2019-03-31 NOTE — Progress Notes (Signed)
PT Cancellation Note  Patient Details Name: Misty [name] MRN: [id number] DOB: 1941/06/25   Cancelled Treatment:    Reason Eval/Treat Not Completed: Patient at procedure or test/unavailable. Pt at cath lab. PT will continue to follow pt acutely as available.    [name] [name] 03/31/2019, 12:05 PM

## 2019-04-01 ENCOUNTER — Encounter: Payer: Medicare Other | Admitting: *Deleted

## 2019-04-01 DIAGNOSIS — J90 Pleural effusion, not elsewhere classified: Secondary | ICD-10-CM

## 2019-04-01 LAB — BASIC METABOLIC PANEL
Anion gap: 11 (ref 5–15)
BUN: 10 mg/dL (ref 8–23)
CO2: 34 mmol/L — ABNORMAL HIGH (ref 22–32)
Calcium: 9.2 mg/dL (ref 8.9–10.3)
Chloride: 96 mmol/L — ABNORMAL LOW (ref 98–111)
Creatinine, Ser: 1 mg/dL (ref 0.44–1.00)
GFR calc Af Amer: >60 mL/min (ref >60)
GFR calc non Af Amer: 54 mL/min — ABNORMAL LOW (ref >60)
Glucose, Bld: 109 mg/dL — ABNORMAL HIGH (ref 70–99)
Potassium: 5.4 mmol/L — ABNORMAL HIGH (ref 3.5–5.1)
Sodium: 141 mmol/L (ref 135–145)

## 2019-04-01 NOTE — TOC Benefit Eligibility Note (Signed)
Transition of Care [facility]) Benefit Eligibility Note    Patient Details  Name: Misty [name] MRN: [id number] Date of Birth: 1941-03-31   Medication/Dose: Arne Cleveland  5 MG BID(APIXABAN :  NON-FORMULARY)  Covered?: Yes     Prescription Coverage Preferred Pharmacy: YES([facility])  Spoke with Person/Company/Phone Number:: MICHELLE([employer] # [phone])  Co-Pay: $ 47.00  Prior Approval: No  Deductible: Met       Memory Argue Phone Number: 04/01/2019, 4:14 PM

## 2019-04-01 NOTE — TOC Transition Note (Signed)
Transition of Care [facility]) - CM/SW Discharge Note   Patient Details  Name: Misty [name] MRN: [id number] Date of Birth: 22-Mar-1941  Transition of Care [facility]) CM/SW Contact:  Gala Lewandowsky, RN Phone Number: 04/01/2019, 10:41 AM   Clinical Narrative:   Pt plan to return home with family support- pt will need to transition with oxygen. DME order will be written and placed in Epic. CM did make referral with [facility] [employer]. DME to be delivered to room prior to transition home. Patient states she has transportation home once discharge written. No further needs from CM at this time.     Barriers to Discharge: No Barriers Identified   Patient Goals and CMS Choice Patient states their goals for this hospitalization and ongoing recovery are:: "to be as independent as she can be" CMS Medicare.gov Compare Post Acute Care list provided to:: Other (Comment Required)(Patient did not need- previously with [facility]. ) Choice offered to / list presented to : Patient   Discharge Plan and Services In-house Referral: NA Discharge Planning Services: CM Consult Post Acute Care Choice: Home Health          DME Arranged: Oxygen DME Agency: [employer] Date DME Agency Contacted: 04/01/19 Time DME Agency Contacted: 1041 Representative spoke with at DME Agency: [facility] Arranged: RN, Disease Management, PT, OT HH Agency: [facility] Date HH Agency Contacted: 03/29/19 Time HH Agency Contacted: 1117 Representative spoke with at [facility] Agency: Jesusita Oka- left voicemail  Social Determinants of Health (SDOH) Interventions     Readmission Risk Interventions Readmission Risk Prevention Plan 03/29/2019  Transportation Screening Complete  HRI or Home Care Consult Complete  Social Work Consult for Recovery Care Planning/Counseling Complete  Palliative Care Screening Not Applicable  Medication Review [employer]) Complete  Some recent data might be hidden

## 2019-04-01 NOTE — Progress Notes (Signed)
Physical Therapy Treatment Patient Details Name: Misty [name] MRN: [id number] DOB: 02-28-1941 Today's Date: 04/01/2019    History of Present Illness 78 y.o. female admitted with dyspnea for 1 weeks with PNA and aFib. PMHx: COPD, nicotine dep,  GERD, Hyperlipidemia, Migraines, Anxiety, left hip ORIF 01/07/19, colitis    PT Comments    Pt states that she has already walked this morning, but ultimately agrees to walking again with therapy. Pt is min guard for bed mobility, transfers and ambulation with RW. Pt able to progress ambulation distance and maintain oxygen saturation >91%O2 on 3L O2. D/c plans remain appropriate at this time. PT will continue to follow acutely.      Follow Up Recommendations  Home health PT;Supervision/Assistance - 24 hour     Equipment Recommendations  None recommended by PT    Recommendations for Other Services OT consult     Precautions / Restrictions Precautions Precautions: Fall Precaution Comments: frequent incontinence Restrictions Weight Bearing Restrictions: No    Mobility  Bed Mobility Overal bed mobility: Needs Assistance Bed Mobility: Rolling;Supine to Sit     Supine to sit: Min guard     General bed mobility comments: min guard for safety, increased time and effort, vc for scooting hips to EoB, pt with extremely flexed posture at EoB but explains if feels good to stretch back   Transfers Overall transfer level: Needs assistance Equipment used: Rolling walker (2 wheeled) Transfers: Sit to/from Stand Sit to Stand: Min guard         General transfer comment: min guard for safety, vc for hand placement for power up   Ambulation/Gait Ambulation/Gait assistance: Min guard Gait Distance (Feet): 100 Feet Assistive device: Rolling walker (2 wheeled) Gait Pattern/deviations: Step-through pattern;Decreased stride length;Trunk flexed Gait velocity: slowed Gait velocity interpretation: 1.31 - 2.62 ft/sec, indicative of limited  community ambulator General Gait Details: hands on min guard for safety, cues for posture, position in RW and assist to avoid environmental obstacles.          Balance Overall balance assessment: Needs assistance   Sitting balance-Leahy Scale: Fair Sitting balance - Comments: able to sit without UE support   Standing balance support: Bilateral upper extremity supported Standing balance-Leahy Scale: Poor Standing balance comment: bil UE support on RW for gait                            Cognition Arousal/Alertness: Awake/alert Behavior During Therapy: Anxious Overall Cognitive Status: Within Functional Limits for tasks assessed                                 General Comments: pt with baseline anxiety, xanax 3x/day      Exercises      General Comments General comments (skin integrity, edema, etc.): Pt ambulated on 3L O2 and SaO2 >91%O2 throughout      Pertinent Vitals/Pain Pain Assessment: Faces Faces Pain Scale: Hurts little more Pain Location: back Pain Descriptors / Indicators: Aching;Discomfort Pain Intervention(s): Limited activity within patient's tolerance;Monitored during session;Repositioned           PT Goals (current goals can now be found in the care plan section) Acute Rehab PT Goals Patient Stated Goal: watch stories and racing, play with grandkids PT Goal Formulation: With patient Time For Goal Achievement: 04/09/19 Potential to Achieve Goals: Fair Progress towards PT goals: Progressing toward goals    Frequency  Min 3X/week      PT Plan Current plan remains appropriate       AM-PAC PT "6 Clicks" Mobility   Outcome Measure  Help needed turning from your back to your side while in a flat bed without using bedrails?: A Little Help needed moving from lying on your back to sitting on the side of a flat bed without using bedrails?: A Little Help needed moving to and from a bed to a chair (including a wheelchair)?:  A Little Help needed standing up from a chair using your arms (e.g., wheelchair or bedside chair)?: A Little Help needed to walk in hospital room?: A Little Help needed climbing 3-5 steps with a railing? : A Lot 6 Click Score: 17    End of Session Equipment Utilized During Treatment: Gait belt;Oxygen Activity Tolerance: Patient limited by fatigue Patient left: in chair;with call bell/phone within reach;Other (comment)(with NT for bathing) Nurse Communication: Mobility status PT Visit Diagnosis: Other abnormalities of gait and mobility (R26.89);Muscle weakness (generalized) (M62.81);History of falling (Z91.81);Difficulty in walking, not elsewhere classified (R26.2)     Time: 6045-4098 PT Time Calculation (min) (ACUTE ONLY): 21 min  Charges:  $Gait Training: 8-22 mins                     Maudry Zeidan B. Beverely Risen PT, DPT Acute Rehabilitation Services Pager [phone] Office [phone]    Misty [name] 04/01/2019, 12:40 PM

## 2019-04-01 NOTE — Telephone Encounter (Signed)
Pt still in hosp./cy

## 2019-04-01 NOTE — Care Management (Signed)
Benefits Check submitted for Eliquis. CM will continue to monitor for cost. No further transition of care needs identified at this time. Gala Lewandowsky, RN, BSN Case Manager [phone]

## 2019-04-01 NOTE — Progress Notes (Signed)
SATURATION QUALIFICATIONS: (This note is used to comply with regulatory documentation for home oxygen)  Patient Saturations on Room Air at Rest = 87%  Patient Saturations on Room Air while Ambulating = 87%  Patient Saturations on 3 Liters of oxygen while Ambulating = >91%  Please briefly explain why patient needs home oxygen:

## 2019-04-01 NOTE — Care Management Important Message (Signed)
Important Message  Patient Details  Name: Misty [name] MRN: [id number] Date of Birth: [birth year]   Medicare Important Message Given:  Yes    Dorena Bodo 04/01/2019, 2:56 PM

## 2019-04-01 NOTE — Progress Notes (Signed)
Nutrition Follow-up  INTERVENTION:   -Continue Ensure Enlive po BID, each supplement provides 350 kcal and 20 grams of protein  NUTRITION DIAGNOSIS:   Increased nutrient needs related to chronic illness(COPD) as evidenced by estimated needs.  Ongoing.  GOAL:   Patient will meet greater than or equal to 90% of their needs  Progressing.  MONITOR:   PO intake, Supplement acceptance, Labs, Weight trends, I & O's  ASSESSMENT:   78 year old female with COPD, nicotine dependence, hyperlipidemia, GERD, anxiety presented with dyspnea for past 1 week. Admitted for Atrial fibrillation.  5/13: s/p RIGHT/LEFT HEART CATH AND CORONARY ANGIOGRAPHY   **RD working remotely**  Patient currently consuming 75-100% of meals as of 5/13 documentation. Pt has been accepting Ensure supplements ~50% of the time, will continue as this provides additional kcal and protein.  Pt has been receiving Lasix and diuresing. Pt's weight is now back to admission weight of 126 lbs.  Labs reviewed: Elevated K Medications: Ferrous fumarate/B-12/Vitamin C/folic acid capsule daily, Lasix tablet daily, Multivitamin with minerals daily, Florastor capsule BID, Vitamin B-12 tablet  Diet Order:   Diet Order            Diet Heart Room service appropriate? Yes; Fluid consistency: Thin  Diet effective now              EDUCATION NEEDS:   No education needs have been identified at this time  Skin:  Skin Assessment: Reviewed RN Assessment  Last BM:  5/14  Height:   Ht Readings from Last 1 Encounters:  03/26/19 5\' 7"  (1.702 m)    Weight:   Wt Readings from Last 1 Encounters:  04/01/19 57.4 kg    Ideal Body Weight:  61.3 kg  BMI:  Body mass index is 19.83 kg/m.  Estimated Nutritional Needs:   Kcal:  1450-1650  Protein:  65-75g  Fluid:  1.6L/day  Tilda Franco, MS, RD, LDN [facility] Inpatient Clinical Dietitian Pager: [phone] After Hours Pager: [phone]

## 2019-04-01 NOTE — Discharge Instructions (Addendum)
Heart Failure Heart failure is a condition in which the heart has trouble pumping blood because it has become weak or stiff. This means that the heart does not pump blood efficiently for the body to work well. For some people with heart failure, fluid may back up into the lungs and there may be swelling (edema) in the lower legs. Heart failure is usually a long-term (chronic) condition. It is important for you to take good care of yourself and follow the treatment plan from your health care provider. What are the causes? This condition is caused by some health problems, including:  High blood pressure (hypertension). Hypertension causes the heart muscle to work harder than normal. High blood pressure eventually causes the heart to become stiff and weak.  Coronary artery disease (CAD). CAD is the buildup of cholesterol and fat (plaques) in the arteries of the heart.  Heart attack (myocardial infarction). Injured tissue, which is caused by the heart attack, does not contract as well and the heart's ability to pump blood is weakened.  Abnormal heart valves. When the heart valves do not open and close properly, the heart muscle must pump harder to keep the blood flowing.  Heart muscle disease (cardiomyopathy or myocarditis). Heart muscle disease is damage to the heart muscle from a variety of causes, such as drug or alcohol abuse, infections, or unknown causes. These can increase the risk of heart failure.  Lung disease. When the lungs do not work properly, the heart must work harder. What increases the risk? Risk of heart failure increases as a person ages. This condition is also more likely to develop in people who:  Are overweight.  Are female.  Smoke or chew tobacco.  Abuse alcohol or illegal drugs.  Have taken medicines that can damage the heart, such as chemotherapy drugs.  Have diabetes. ? High blood sugar (glucose) is associated with high fat (lipid) levels in the  blood. ? Diabetes can also damage tiny blood vessels that carry nutrients to the heart muscle.  Have abnormal heart rhythms.  Have thyroid problems.  Have low blood counts (anemia). What are the signs or symptoms? Symptoms of this condition include:  Shortness of breath with activity, such as when climbing stairs.  Persistent cough.  Swelling of the feet, ankles, legs, or abdomen.  Unexplained weight gain.  Difficulty breathing when lying flat (orthopnea).  Waking from sleep because of the need to sit up and get more air.  Rapid heartbeat.  Fatigue and loss of energy.  Feeling light-headed, dizzy, or close to fainting.  Loss of appetite.  Nausea.  Increased urination during the night (nocturia).  Confusion. How is this diagnosed? This condition is diagnosed based on:  Medical history, symptoms, and a physical exam.  Diagnostic tests, which may include: ? Echocardiogram. ? Electrocardiogram (ECG). ? Chest X-ray. ? Blood tests. ? Exercise stress test. ? Radionuclide scans. ? Cardiac catheterization and angiogram. How is this treated? Treatment for this condition is aimed at managing the symptoms of heart failure. Medicines, behavioral changes, or other treatments may be necessary to treat heart failure. Medicines These may include:  Angiotensin-converting enzyme (ACE) inhibitors. This type of medicine blocks the effects of a blood protein called angiotensin-converting enzyme. ACE inhibitors relax (dilate) the blood vessels and help to lower blood pressure.  Angiotensin receptor blockers (ARBs). This type of medicine blocks the actions of a blood protein called angiotensin. ARBs dilate the blood vessels and help to lower blood pressure.  Water pills (diuretics). Diuretics  cause the kidneys to remove salt and water from the blood. The extra fluid is removed through urination, leaving a lower volume of blood that the heart has to pump.  Beta blockers. These  improve heart muscle strength and they prevent the heart from beating too quickly.  Digoxin. This increases the force of the heartbeat. Healthy behavior changes These may include:  Reaching and maintaining a healthy weight.  Stopping smoking or chewing tobacco.  Eating heart-healthy foods.  Limiting or avoiding alcohol.  Stopping use of street drugs (illegal drugs).  Physical activity. Other treatments These may include:  Surgery to open blocked coronary arteries or repair damaged heart valves.  Placement of a biventricular pacemaker to improve heart muscle function (cardiac resynchronization therapy). This device paces both the right ventricle and left ventricle.  Placement of a device to treat serious abnormal heart rhythms (implantable cardioverter defibrillator, or ICD).  Placement of a device to improve the pumping ability of the heart (left ventricular assist device, or LVAD).  Heart transplant. This can cure heart failure, and it is considered for certain patients who do not improve with other therapies. Follow these instructions at home: Medicines  Take over-the-counter and prescription medicines only as told by your health care provider. Medicines are important in reducing the workload of your heart, slowing the progression of heart failure, and improving your symptoms. ? Do not stop taking your medicine unless your health care provider told you to do that. ? Do not skip any dose of medicine. ? Refill your prescriptions before you run out of medicine. You need your medicines every day. Eating and drinking   Eat heart-healthy foods. Talk with a dietitian to make an eating plan that is right for you. ? Choose foods that contain no trans fat and are low in saturated fat and cholesterol. Healthy choices include fresh or frozen fruits and vegetables, fish, lean meats, legumes, fat-free or low-fat dairy products, and whole-grain or high-fiber foods. ? Limit salt (sodium)  if directed by your health care provider. Sodium restriction may reduce symptoms of heart failure. Ask a dietitian to recommend heart-healthy seasonings. ? Use healthy cooking methods instead of frying. Healthy methods include roasting, grilling, broiling, baking, poaching, steaming, and stir-frying.  Limit your fluid intake if directed by your health care provider. Fluid restriction may reduce symptoms of heart failure. Lifestyle   Stop smoking or using chewing tobacco. Nicotine and tobacco can damage your heart and your blood vessels. Do not use nicotine gum or patches before talking to your health care provider.  Limit alcohol intake to no more than 1 drink per day for non-pregnant women and 2 drinks per day for men. One drink equals 12 oz of beer, 5 oz of wine, or 1 oz of hard liquor. ? Drinking more than that is harmful to your heart. Tell your health care provider if you drink alcohol several times a week. ? Talk with your health care provider about whether any level of alcohol use is safe for you. ? If your heart has already been damaged by alcohol or you have severe heart failure, drinking alcohol should be stopped completely.  Stop use of illegal drugs.  Lose weight if directed by your health care provider. Weight loss may reduce symptoms of heart failure.  Do moderate physical activity if directed by your health care provider. People who are elderly and people with severe heart failure should consult with a health care provider for physical activity recommendations. Monitor important information  Weigh yourself every day. Keeping track of your weight daily helps you to notice excess fluid sooner. ? Weigh yourself every morning after you urinate and before you eat breakfast. ? Wear the same amount of clothing each time you weigh yourself. ? Record your daily weight. Provide your health care provider with your weight record.  Monitor and record your blood pressure as told by your  health care provider.  Check your pulse as told by your health care provider. Dealing with extreme temperatures  If the weather is extremely hot: ? Avoid vigorous physical activity. ? Use air conditioning or fans or seek a cooler location. ? Avoid caffeine and alcohol. ? Wear loose-fitting, lightweight, and light-colored clothing.  If the weather is extremely cold: ? Avoid vigorous physical activity. ? Layer your clothes. ? Wear mittens or gloves, a hat, and a scarf when you go outside. ? Avoid alcohol. General instructions  Manage other health conditions such as hypertension, diabetes, thyroid disease, or abnormal heart rhythms as told by your health care provider.  Learn to manage stress. If you need help to do this, ask your health care provider.  Plan rest periods when fatigued.  Get ongoing education and support as needed.  Participate in or seek rehabilitation as needed to maintain or improve independence and quality of life.  Stay up to date with immunizations. Keeping current on pneumococcal and influenza immunizations is especially important to prevent respiratory infections.  Keep all follow-up visits as told by your health care provider. This is important. Contact a health care provider if:  You have a rapid weight gain.  You have increasing shortness of breath that is unusual for you.  You are unable to participate in your usual physical activities.  You tire easily.  You cough more than normal, especially with physical activity.  You have any swelling or more swelling in areas such as your hands, feet, ankles, or abdomen.  You are unable to sleep because it is hard to breathe.  You feel like your heart is beating quickly (palpitations).  You become dizzy or light-headed when you stand up. Get help right away if:  You have difficulty breathing.  You notice or your family notices a change in your awareness, such as having trouble staying awake or  having difficulty with concentration.  You have pain or discomfort in your chest.  You have an episode of fainting (syncope). This information is not intended to replace advice given to you by your health care provider. Make sure you discuss any questions you have with your health care provider. Document Released: 11/04/2005 Document Revised: 10/03/2017 Document Reviewed: 05/29/2016 Elsevier Interactive Patient Education  2019 Elsevier Inc[employer]   Heart Failure Action Plan A heart failure action plan helps you understand what to do when you have symptoms of heart failure. Follow the plan that was created by you and your health care provider. Review your plan each time you visit your health care provider. Red zone These signs and symptoms mean you should get medical help right away:  You have trouble breathing when resting.  You have a dry cough that is getting worse.  You have swelling or pain in your legs or abdomen that is getting worse.  You suddenly gain more than 2-3 lb (0.9-1.4 kg) in a day, or more than 5 lb (2.3 kg) in one week. This amount may be more or less depending on your condition.  You have trouble staying awake or you feel confused.  You have  chest pain.  You do not have an appetite.  You pass out. If you experience any of these symptoms:  Call your local emergency services (911 in the [place] right away or seek help at the emergency department of the nearest hospital. Yellow zone These signs and symptoms mean your condition may be getting worse and you should make some changes:  You have trouble breathing when you are active or you need to sleep with extra pillows.  You have swelling in your legs or abdomen.  You gain 2-3 lb (0.9-1.4 kg) in one day, or 5 lb (2.3 kg) in one week. This amount may be more or less depending on your condition.  You get tired easily.  You have trouble sleeping.  You have a dry cough. If you experience any of these  symptoms:  Contact your health care provider within the next day.  Your health care provider may adjust your medicines. Green zone These signs mean you are doing well and can continue what you are doing:  You do not have shortness of breath.  You have very little swelling or no new swelling.  Your weight is stable (no gain or loss).  You have a normal activity level.  You do not have chest pain or any other new symptoms. Follow these instructions at home:  Take over-the-counter and prescription medicines only as told by your health care provider.  Weigh yourself daily. Your target weight is __________ lb (__________ kg). ? Call your health care provider if you gain more than __________ lb (__________ kg) in a day, or more than __________ lb (__________ kg) in one week.  Eat a heart-healthy diet. Work with a diet and nutrition specialist (dietitian) to create an eating plan that is best for you.  Keep all follow-up visits as told by your health care provider. This is important. Where to find more information  American Heart Association: www.heart.org Summary  Follow the action plan that was created by you and your health care provider.  Get help right away if you have any symptoms in the Red zone. This information is not intended to replace advice given to you by your health care provider. Make sure you discuss any questions you have with your health care provider. Document Released: 12/14/2016 Document Revised: 07/09/2017 Document Reviewed: 12/14/2016 Elsevier Interactive Patient Education  2019 [employer]. Do the following things EVERY DAY:  1) Weigh yourself EVERY morning after you go to the bathroom but before you eat or drink anything. Write this number down in a weight log/diary. If you gain 3 pounds overnight or 5 pounds in a week, call the office.  2) Take your medicines as prescribed. If you have concerns about your medications, please call us before you stop  taking them.   3) Eat low salt foods--Limit salt (sodium) to 2000 mg per day. This will help prevent your body from holding onto fluid. Read food labels as many processed foods have a lot of sodium, especially canned goods and prepackaged meats. If you would like some assistance choosing low sodium foods, we would be happy to set you up with a nutritionist.  4) Stay as active as you can everyday. Staying active will give you more energy and make your muscles stronger. Start with 5 minutes at a time and work your way up to 30 minutes a day. Break up your activities--do some in the morning and some in the afternoon. Start with 3 days per week and work your way  up to 5 days as you can.  If you have chest pain, feel short of breath, dizzy, or lightheaded, STOP. If you don't feel better after a short rest, call 911. If you do feel better, call the office to let us know you have symptoms with exercise.  5) Limit all fluids for the day to less than 2 liters. Fluid includes all drinks, coffee, juice, ice chips, soup, jello, and all other liquids.  Your Cardiologist is: Jodelle Red, MD, PhD [facility]  [address] [place], [place] [address] [phone]  YOUR CARDIOLOGY TEAM HAS ARRANGED FOR AN E-VISIT FOR YOUR APPOINTMENT - PLEASE REVIEW IMPORTANT INFORMATION BELOW SEVERAL DAYS PRIOR TO YOUR APPOINTMENT  Due to the recent COVID-19 pandemic, we are transitioning in-person office visits to tele-medicine visits in an effort to decrease unnecessary exposure to our patients, their families, and staff. These visits are billed to your insurance just like a normal visit is. We also encourage you to sign up for MyChart if you have not already done so. You will need a smartphone if possible. For patients that do not have this, we can still complete the visit using a regular telephone but do prefer a smartphone to enable video when possible. You may have a family member that  lives with you that can help. If possible, we also ask that you have a blood pressure cuff and scale at home to measure your blood pressure, heart rate and weight prior to your scheduled appointment. Patients with clinical needs that need an in-person evaluation and testing will still be able to come to the office if absolutely necessary. If you have any questions, feel free to call our office.     YOUR PROVIDER WILL BE USING THE FOLLOWING PLATFORM TO COMPLETE YOUR VISIT:  LAND PHONE only    IF USING MYCHART - How to Download the MyChart App to Your SmartPhone   - If Apple, go to [employer] and type in MyChart in the search bar and download the app. If Android, ask patient to go to [employer] and type in [place] in the search bar and download the app. The app is free but as with any other app downloads, your phone may require you to verify saved payment information or Apple/Android password.  - You will need to then log into the app with your MyChart username and password, and select [facility] as your healthcare provider to link the account.  - When it is time for your visit, go to the MyChart app, find appointments, and click Begin Video Visit. Be sure to Select Allow for your device to access the Microphone and Camera for your visit. You will then be connected, and your provider will be with you shortly.  **If you have any issues connecting or need assistance, please contact MyChart service desk [phone])**  **If using a computer, in order to ensure the best quality for your visit, you will need to use either of the following Internet Browsers: Agricultural consultant or Microsoft Edge**   IF USING DOXIMITY or [email or web address].ME - The staff will give you instructions on receiving your link to join the meeting the day of your visit.      2-3 DAYS BEFORE YOUR APPOINTMENT  You will receive a telephone call from one of our [facility] team members - your caller ID may say "Unknown caller."  If this is a video visit, we will walk you through how to  get the video launched on your phone. We will remind you check your blood pressure, heart rate and weight prior to your scheduled appointment. If you have an Apple Watch or Kardia, please upload any pertinent ECG strips the day before or morning of your appointment to MyChart. Our staff will also make sure you have reviewed the consent and agree to move forward with your scheduled tele-health visit.     THE DAY OF YOUR APPOINTMENT  Approximately 15 minutes prior to your scheduled appointment, you will receive a telephone call from one of HeartCare team - your caller ID may say "Unknown caller."  Our staff will confirm medications, vital signs for the day and any symptoms you may be experiencing. Please have this information available prior to the time of visit start. It may also be helpful for you to have a pad of paper and pen handy for any instructions given during your visit. They will also walk you through joining the smartphone meeting if this is a video visit.    CONSENT FOR TELE-HEALTH VISIT - PLEASE REVIEW  I hereby voluntarily request, consent and authorize [facility] and its employed or contracted physicians, physician assistants, nurse practitioners or other licensed health care professionals (the Practitioner), to provide me with telemedicine health care services (the Services") as deemed necessary by the treating Practitioner. I acknowledge and consent to receive the Services by the Practitioner via telemedicine. I understand that the telemedicine visit will involve communicating with the Practitioner through live audiovisual communication technology and the disclosure of certain medical information by electronic transmission. I acknowledge that I have been given the opportunity to request an in-person assessment or other available alternative prior to the telemedicine visit and am voluntarily participating in the telemedicine  visit.  I understand that I have the right to withhold or withdraw my consent to the use of telemedicine in the course of my care at any time, without affecting my right to future care or treatment, and that the Practitioner or I may terminate the telemedicine visit at any time. I understand that I have the right to inspect all information obtained and/or recorded in the course of the telemedicine visit and may receive copies of available information for a reasonable fee.  I understand that some of the potential risks of receiving the Services via telemedicine include:   Delay or interruption in medical evaluation due to technological equipment failure or disruption;  Information transmitted may not be sufficient (e.g. poor resolution of images) to allow for appropriate medical decision making by the Practitioner; and/or   In rare instances, security protocols could fail, causing a breach of personal health information.  Furthermore, I acknowledge that it is my responsibility to provide information about my medical history, conditions and care that is complete and accurate to the best of my ability. I acknowledge that Practitioner's advice, recommendations, and/or decision may be based on factors not within their control, such as incomplete or inaccurate data provided by me or distortions of diagnostic images or specimens that may result from electronic transmissions. I understand that the practice of medicine is not an exact science and that Practitioner makes no warranties or guarantees regarding treatment outcomes. I acknowledge that I will receive a copy of this consent concurrently upon execution via email to the email address I last provided but may also request a printed copy by calling the office of [facility].    I understand that my insurance will be billed for this visit.  I have read or had this consent read to me.  I understand the contents of this consent, which adequately explains  the benefits and risks of the Services being provided via telemedicine.   I have been provided ample opportunity to ask questions regarding this consent and the Services and have had my questions answered to my satisfaction.  I give my informed consent for the services to be provided through the use of telemedicine in my medical care  By participating in this telemedicine visit I agree to the above.     Information on my medicine - ELIQUIS (apixaban)  Why was Eliquis prescribed for you? Eliquis was prescribed for you to reduce the risk of a blood clot forming that can cause a stroke if you have a medical condition called atrial fibrillation (a type of irregular heartbeat).  What do You need to know about Eliquis ? Take your Eliquis TWICE DAILY - one tablet in the morning and one tablet in the evening with or without food. If you have difficulty swallowing the tablet whole please discuss with your pharmacist how to take the medication safely.  Take Eliquis exactly as prescribed by your doctor and DO NOT stop taking Eliquis without talking to the doctor who prescribed the medication.  Stopping may increase your risk of developing a stroke.  Refill your prescription before you run out.  After discharge, you should have regular check-up appointments with your healthcare provider that is prescribing your Eliquis.  In the future your dose may need to be changed if your kidney function or weight changes by a significant amount or as you get older.  What do you do if you miss a dose? If you miss a dose, take it as soon as you remember on the same day and resume taking twice daily.  Do not take more than one dose of ELIQUIS at the same time to make up a missed dose.  Important Safety Information A possible side effect of Eliquis is bleeding. You should call your healthcare provider right away if you experience any of the following: ? Bleeding from an injury or your nose that does not  stop. ? Unusual colored urine (red or dark brown) or unusual colored stools (red or black). ? Unusual bruising for unknown reasons. ? A serious fall or if you hit your head (even if there is no bleeding).  Some medicines may interact with Eliquis and might increase your risk of bleeding or clotting while on Eliquis. To help avoid this, consult your healthcare provider or pharmacist prior to using any new prescription or non-prescription medications, including herbals, vitamins, non-steroidal anti-inflammatory drugs (NSAIDs) and supplements.  This website has more information on Eliquis (apixaban): http://www.eliquis.com/eliquis/home

## 2019-04-01 NOTE — Progress Notes (Signed)
Progress Note  Patient Name: Misty [name] Date of Encounter: 04/01/2019  Primary Cardiologist: Mertie Moores, MD  Subjective   Feeling better, though fatigued this AM. Reviewed results of cath. All questions answered. Discussed heart failure education, medications, follow up plans today. Shortness of breath stable, still on O2 this AM.  Inpatient Medications    Scheduled Meds:  ALPRAZolam  0.5 mg Oral TID   apixaban  5 mg Oral BID   bisoprolol  5 mg Oral Daily   docusate sodium  200 mg Oral BID   escitalopram  20 mg Oral QHS   feeding supplement (ENSURE ENLIVE)  237 mL Oral BID BM   ferrous [id number] C-folic acid  1 capsule Oral QAC breakfast   furosemide  20 mg Oral Daily   lidocaine  1 patch Transdermal Q24H   multivitamin with minerals  1 tablet Oral Daily   nystatin-triamcinolone ointment  1 application Topical BID   pantoprazole  40 mg Oral BID AC   saccharomyces boulardii  250 mg Oral BID   sodium chloride flush  3 mL Intravenous Q12H   sodium chloride flush  3 mL Intravenous Q12H   vitamin B-12  1,000 mcg Oral Daily   Continuous Infusions:  sodium chloride     sodium chloride     PRN Meds: sodium chloride, sodium chloride, acetaminophen **OR** acetaminophen, albuterol, ipratropium-albuterol, magic mouthwash w/lidocaine, ondansetron (ZOFRAN) IV, sodium chloride flush, sodium chloride flush   Vital Signs    Vitals:   03/31/19 1420 03/31/19 1449 03/31/19 1923 04/01/19 0342  BP: 105/62 112/67 (!) 94/55 122/68  Pulse: 70 77 74 72  Resp: 17 (!) 26 (!) 21 (!) 23  Temp:  98.2 F (36.8 C) 97.9 F (36.6 C) 97.7 F (36.5 C)  TempSrc:  Oral Oral Oral  SpO2: 99% 98% 100% 95%  Weight:    57.4 kg  Height:        Intake/Output Summary (Last 24 hours) at 04/01/2019 0925 Last data filed at 04/01/2019 0800 Gross per 24 hour  Intake 1217.88 ml  Output 2575 ml  Net -1357.12 ml   Filed Weights   03/30/19 0411 03/31/19 0409 04/01/19  0342  Weight: 60.4 kg 59.8 kg 57.4 kg    Telemetry    SR - Personally reviewed.  ECG    No new since 5/7- personally reviewed.  Physical Exam   General: Well developed, frail elderly, female in no acute distress Head: EOMi.   Normocephalic and atraumatic. Still on . Lungs:  Basilar rales improved, mild rhonchi in mid fields today. Heart: HRRR S1 S2, without MRG.  Pulses are 2+ & equal. JVD slightly up from yesterday, V waves about 10. Abdomen: Bowel sounds are present, abdomen soft and non-tender without masses or  hernias noted. Msk: Normal strength and tone for age. Extremities: No clubbing, cyanosis or edema.    Skin:  No rashes or lesions noted. Neuro: Alert and oriented X 3. Psych:  Good affect, responds appropriately  Labs    Chemistry Recent Labs  Lab 03/30/19 0345 03/31/19 0403 03/31/19 1138 03/31/19 1154 04/01/19 0456  NA 143 140 139 138 141  K 4.1 5.2* 5.1 5.4* 5.4*  CL 91* 95*  --   --  96*  CO2 39* 39*  --   --  34*  GLUCOSE 112* 123*  --   --  109*  BUN 16 13  --   --  10  CREATININE 0.82 0.91  --   --  1.00  CALCIUM 8.7* 8.8*  --   --  9.2  GFRNONAA >60 >60  --   --  54*  GFRAA >60 >60  --   --  >60  ANIONGAP 13 6  --   --  11     Hematology Recent Labs  Lab 03/29/19 0536 03/30/19 0345 03/31/19 0403 03/31/19 1138 03/31/19 1154  WBC 6.9 6.4 7.1  --   --   RBC 3.60* 3.45* 3.67*  --   --   HGB 10.4* 9.8* 10.5* 11.2* 11.2*  HCT 34.8* 33.5* 35.8* 33.0* 33.0*  MCV 96.7 97.1 97.5  --   --   MCH 28.9 28.4 28.6  --   --   MCHC 29.9* 29.3* 29.3*  --   --   RDW 17.2* 17.4* 17.4*  --   --   PLT 169 156 166  --   --     Cardiac Enzymes Recent Labs  Lab 03/25/19 2303 03/26/19 0932 03/26/19 1458 03/26/19 2054  TROPONINI 0.11* 0.15* 0.15* 0.14*    BNP Recent Labs  Lab 03/25/19 2033  BNP >4,500.0*     DDimer  Recent Labs  Lab 03/25/19 2122  DDIMER 1.69*     Radiology    No results found.  Cardiac Studies   Echocardiogram  03/26/2019: 1. The left ventricle has moderate-severely reduced systolic function, with an ejection fraction of 30-35%. The cavity size was normal. There is mildly increased left ventricular wall thickness. Left ventricular diastolic function could not be evaluated secondary to atrial fibrillation. There is pre-excitation of the interventricular septum. Left ventricular diffuse hypokinesis. 2. The right ventricle has mildly reduced systolic function. The cavity was mildly enlarged. There is no increase in right ventricular wall thickness. 3. Left atrial size was moderately dilated. 4. Right atrial size was mildly dilated. 5. Trivial pericardial effusion is present. 6. The mitral valve is abnormal. Mild thickening of the mitral valve leaflet. Mitral valve regurgitation is moderate by color flow Doppler. 7. The tricuspid valve was grossly normal. 8. The aortic valve is tricuspid Mild sclerosis of the aortic valve. Aortic valve regurgitation is trivial by color flow Doppler. No stenosis of the aortic valve. 9. The inferior vena cava was dilated in size with <50% respiratory variability.  R/LHC 03/31/19 1. Mild nonobstructive CAD, calcified coronary arteries 2. Normal right and left heart filling pressures  Left Anterior Descending  Prox LAD lesion 30% stenosed  Prox LAD lesion is 30% stenosed. The lesion is severely calcified.  Ramus Intermedius  Vessel is large. Vessel is angiographically normal.  Left Circumflex  Vessel is large. The vessel exhibits minimal luminal irregularities.  Right Coronary Artery  Prox RCA lesion 35% stenosed  Prox RCA lesion is 35% stenosed.     The patient appears to have well compensated heart failure after IV diuresis. Will transition to apixaban for anticoagulation (start tomorrow am), stop ASA, and transition IV to PO lasix. D/W Dr Harrell Gave.   Most Recent Value  Fick Cardiac Output 5.1 L/min  Fick Cardiac Output Index 3 (L/min)/BSA  RA A Wave  9 mmHg  RA V Wave 8 mmHg  RA Mean 6 mmHg  PA Systolic Pressure 37 mmHg  PA Diastolic Pressure 14 mmHg  PA Mean 24 mmHg  PW A Wave 14 mmHg  PW V Wave 15 mmHg  PW Mean 14 mmHg  AO Systolic Pressure 235 mmHg  AO Diastolic Pressure 61 mmHg  AO Mean 78 mmHg  LV Systolic Pressure 361 mmHg  LV Diastolic Pressure  5 mmHg  LV EDP 12 mmHg  AOp Systolic Pressure 76 mmHg  AOp Diastolic Pressure 41 mmHg  AOp Mean Pressure 55 mmHg  LVp Systolic Pressure 315 mmHg  LVp Diastolic Pressure 1 mmHg  LVp EDP Pressure 11 mmHg  QP/QS 1  TPVR Index 7.99 HRUI  TSVR Index 25.94 HRUI  PVR SVR Ratio 0.14  TPVR/TSVR Ratio 0.31     Patient Profile     78 y.o. female with a history of COPD and tobacco abuse, hyperlipidemia, chronic back pain, admitted with pneumonia and newly documented rapid atrial fibrillation/flutter. Found to have newly reduced EF consistent with acute systolic heart failure.  Assessment & Plan    1.  Atrial fibrillation/flutter, newly documented and presenting with RVR.  - CHA2DS2-VASc = 5 (age x 2, female, CHF, CAD) - spont conversion to SR on diltiazem. Given low EF, would not use diltiazem again. - continue bisoprolol - changing to apixaban this AM  2.  Newly documented cardiomyopathy with LVEF 30 to 35% and diffuse hypokinesis.  She also has mildly reduced right ventricular contraction. - continue BB  - admission weight 56.7 kg, peaked at 62.9, today 57.4 kg. Charted as net negative 4.7 L. Euvolemic on exam and RHC numbers. Changed to oral lasix. Will need to track weights at home. - BP dipped low briefly overnight, and still slightly hyperkalemic (5.4) today. Was severely hypokalemic earlier this admission. Potassium repletion stopped yesterday, but no room for ACEi with BP and K today. - changed to lasix 20 mg oral daily. Slight increase in JVD but still at about 10 cm. Good urine output on this dose. - creatinine 1.00 today - cath supports NICM, suspect in the presence of  afib RVR. Would like to get on ACEi/ARB as an outpatient if we can, titrate as tolerated. Cannot start Entresto here given K/BP, but could consider as an alternative to ACEi/ARB. If able, could start ARB first as outpatient, monitor, and if tolerated then change to entresto. - performed HF education, including daily weights, low sodium diet, fluid restriction, activity recommendations. I put these instructions in her discharge instructions as well. -She is still on O2, and while speaking with her, her O2 sats dipped to low 90s. Given her filling pressures yesterday, this is likely not [name] to her heart failure but may be [name] to underlying lung disease/pneumonia. Would encourage her to walk, see if she can be weaned from O2.  3.  Mild troponin I elevation and relatively flat pattern around 0.15 - Most likely demand ischemia 2nd CHF, especially given nonobstructive CAD on cath. - no aspirin given DOAC. LDL 43, Tchol 89 on no meds. Discussed statin today with nonobstructive CAD, age recommendations, LDL goal. She declines statin at this time.  4..  Community-acquired pneumonia.  - COVID 19 negative - per IM  5. Pleural effusion - s/p thoracentesis w/ great improvement in CXR  - transudative by Light's criteria, likely 2/2 HF.  [facility] will sign off.   Medication Recommendations:  Continue current inpatient meds of apixaban, bisoprolol, furosemide. Other recommendations (labs, testing, etc):  BMET in 1 week Follow up as an outpatient:  She has a follow up scheduled with Dr. Percival Spanish (virtual) in the [place] office, which long term is closer to her home.  TIME SPENT WITH PATIENT: >25 minutes of direct patient care. More than 50% of that time was spent on coordination of care and counseling regarding heart failure education, medications, plans for follow up.  Buford Dresser, MD,  PhD [facility]   Signed, Buford Dresser, MD  04/01/2019, 9:25 AM

## 2019-04-01 NOTE — Progress Notes (Signed)
PROGRESS NOTE  Misty [name] LFY:[id number] DOB: 1941-06-21 DOA: 03/25/2019 PCP: Chipper Herb, MD  HPI/Recap of past 24 hours:  Had cardiac cath yesterday recommended medical management, she is off heparin drip, eliquis started Has loose stool, Denies ab pain, no fever She remains oxygen dependent though no cough, edema has resolved   Assessment/Plan: Principal Problem:   Atrial fibrillation with RVR ([place]) Active Problems:   GERD (gastroesophageal reflux disease)   Malnutrition of moderate degree   Hypokalemia   Anxiety and depression   Iron deficiency anemia   S/P IM nail left hip 01/07/2019   Elevated troponin   Community acquired pneumonia   Acute systolic heart failure (HCC)   Lobar pneumonia (HCC)   Acute hypoxemic respiratory failure (HCC)   Pleural effusion, right   Atrial fibrillation with RVR -new onset.- BNP> 4500, d-dimer 1.69 on presentation. - CT angiogram negative for pulmonary embolism but large right pleural effusion.   -TSH was 2.2.  - 2D echocardiogram showed decreased ejection fraction with EF of 30 to 35% with diffuse hypokinesis.   -Patient was initially put on Cardizem and heparin drip, subsequently converted to normal sinus rhythm.  - Currently on bisoprolol., apixaban - Cardiology on board, input appreciated  Acute systolic CHF/cardiomyopathywith right pleural effusion -Status post ultrasound guided diagnostic and therapeutic thoracocentesis with removal of 900 mL of fluid on 5/10.   Gram Stain of the pleural fluid shows rare WBC no organisms, fluid culture negative .   Transudative pleural effusion likely from CHF. - EF 30 to 35% likely precipitated due to acute A. fib with RVR.  -initially on Lasix 20 mg IV twice daily., now on oral lasix 76m daily, 5liter negative so far -remain oxygen dependent . Mild troponin I elevation and relatively flat pattern around 0.15 - Most likely demand ischemia 2nd CHF, especially given nonobstructive CAD  on cath. - no aspirin given DOAC. LDL 43, Tchol 89 on no meds -patient declined statin -appreciate cardiology input   community-acquired pneumonia secondary to Streptococcus pneumonia. H/o COPD/pulmonary fibrosis, home o2 dependent per chart, patient /Regis Billdenies Urine Streptococcus pneumoniae antigen positive.   COVID-19 was negative.   Blood cultures negative in 5 days. treated with IV Rocephin., last dose on 5/13   Acute hypoxic respiratory failure Likely due to heart failure, pna and underline copd/pulmonary fibrosis She is discharged on home 02, follow up with pcp to decide duration of home o2, suspect will need it long term   GERD (gastroesophageal reflux disease) Continue Protonix  Moderate protein calorie malnutrition. Continue nutritional supplements.  Hypokalemia Improved.  Closely monitor with BMP.  Anxiety and depression Continue Xanax  History of iron deficiency anemia -H&H stable, at baseline.  Hemoglobin of 9.8 today.  New mild compression deformity of T10 vertebral body is noted of indeterminate age. Incidental finding on CTA chest on admission, she denies pain H/o back surgery and hip surgery H/o incidental fall in 12/2018 resulted in left hip fracture required surgery  FTT:  PT recommended home health   Code Status: full  Family Communication: patient , daughter in law DHilda Bladesupdated over the phone with patient's permission  Disposition Plan: home with home health, may need home o2 pending on ambulating o2, needs cardiology clearance   Consultants:  cardiology  Procedures:   Cardiac cath on 5/13  Right sided thoracentesis on 5/10  Antibiotics:  Rocephin, last dose on 5/13   Objective: BP (!) 96/57 (BP Location: Right Arm)   Pulse 77  Temp 97.7 F (36.5 C) (Oral)   Resp (!) 22   Ht 5' 7"  (1.702 m)   Wt 57.4 kg   SpO2 98%   BMI 19.83 kg/m   Intake/Output Summary (Last 24 hours) at 04/01/2019 2025 Last data filed  at 04/01/2019 1600 Gross per 24 hour  Intake 960 ml  Output 1075 ml  Net -115 ml   Filed Weights   03/30/19 0411 03/31/19 0409 04/01/19 0342  Weight: 60.4 kg 59.8 kg 57.4 kg    Exam: Patient is examined daily including today on 04/01/2019, exams remain the same as of yesterday except that has changed    General:  Frail, NAD  Cardiovascular: RRR  Respiratory: diminished, no wheezing, no rhonchi, no rales  Abdomen: Soft/ND/NT, positive BS  Musculoskeletal: No Edema  Neuro: alert, oriented   Data Reviewed: Basic Metabolic Panel: Recent Labs  Lab 03/25/19 2033  03/27/19 0713  03/28/19 0523 03/29/19 0536 03/30/19 0345 03/31/19 0403 03/31/19 1138 03/31/19 1154 04/01/19 0456  NA 142   < >  --   --  138 141 143 140 139 138 141  K 2.9*   < >  --    < > 3.6 3.8 4.1 5.2* 5.1 5.4* 5.4*  CL 95*   < >  --   --  91* 93* 91* 95*  --   --  96*  CO2 24   < >  --   --  36* 38* 39* 39*  --   --  34*  GLUCOSE 230*   < >  --   --  120* 118* 112* 123*  --   --  109*  BUN 21   < >  --   --  21 18 16 13   --   --  10  CREATININE 1.21*   < >  --   --  1.01* 0.88 0.82 0.91  --   --  1.00  CALCIUM 8.6*   < >  --   --  8.4* 8.5* 8.7* 8.8*  --   --  9.2  MG 2.1  --  1.8  --   --   --   --   --   --   --   --    < > = values in this interval not displayed.   Liver Function Tests: No results for input(s): AST, ALT, ALKPHOS, BILITOT, PROT, ALBUMIN in the last 168 hours. No results for input(s): LIPASE, AMYLASE in the last 168 hours. No results for input(s): AMMONIA in the last 168 hours. CBC: Recent Labs  Lab 03/25/19 2033  03/27/19 0341 03/28/19 0523 03/29/19 0536 03/30/19 0345 03/31/19 0403 03/31/19 1138 03/31/19 1154  WBC 9.3   < > 7.2 8.3 6.9 6.4 7.1  --   --   NEUTROABS 6.2  --   --   --   --   --   --   --   --   HGB 12.7   < > 10.3* 10.1* 10.4* 9.8* 10.5* 11.2* 11.2*  HCT 43.2   < > 33.4* 34.1* 34.8* 33.5* 35.8* 33.0* 33.0*  MCV 97.7   < > 92.5 95.5 96.7 97.1 97.5  --   --    PLT 285   < > 201 194 169 156 166  --   --    < > = values in this interval not displayed.   Cardiac Enzymes:   Recent Labs  Lab 03/25/19 2033 03/25/19 2303 03/26/19 0932 03/26/19 1458  03/26/19 2054  TROPONINI 0.14* 0.11* 0.15* 0.15* 0.14*   BNP (last 3 results) Recent Labs    03/25/19 2033  BNP >4,500.0*    ProBNP (last 3 results) No results for input(s): PROBNP in the last 8760 hours.  CBG: No results for input(s): GLUCAP in the last 168 hours.  Recent Results (from the past 240 hour(s))  SARS Coronavirus 2 (CEPHEID- Performed in [place] hospital lab), Hosp Order     Status: None   Collection Time: 03/25/19  8:58 PM  Result Value Ref Range Status   SARS Coronavirus 2 NEGATIVE NEGATIVE Final    Comment: (NOTE) If result is NEGATIVE SARS-CoV-2 target nucleic acids are NOT DETECTED. The SARS-CoV-2 RNA is generally detectable in upper and lower  respiratory specimens during the acute phase of infection. The lowest  concentration of SARS-CoV-2 viral copies this assay can detect is 250  copies / mL. A negative result does not preclude SARS-CoV-2 infection  and should not be used as the sole basis for treatment or other  patient management decisions.  A negative result may occur with  improper specimen collection / handling, submission of specimen other  than nasopharyngeal swab, presence of viral mutation(s) within the  areas targeted by this assay, and inadequate number of viral copies  (<250 copies / mL). A negative result must be combined with clinical  observations, patient history, and epidemiological information. If result is POSITIVE SARS-CoV-2 target nucleic acids are DETECTED. The SARS-CoV-2 RNA is generally detectable in upper and lower  respiratory specimens dur ing the acute phase of infection.  Positive  results are indicative of active infection with SARS-CoV-2.  Clinical  correlation with patient history and other diagnostic information is   necessary to determine patient infection status.  Positive results do  not rule out bacterial infection or co-infection with other viruses. If result is PRESUMPTIVE POSTIVE SARS-CoV-2 nucleic acids MAY BE PRESENT.   A presumptive positive result was obtained on the submitted specimen  and confirmed on repeat testing.  While 2019 novel coronavirus  (SARS-CoV-2) nucleic acids may be present in the submitted sample  additional confirmatory testing may be necessary for epidemiological  and / or clinical management purposes  to differentiate between  SARS-CoV-2 and other Sarbecovirus currently known to infect humans.  If clinically indicated additional testing with an alternate test  methodology [phone]) is advised. The SARS-CoV-2 RNA is generally  detectable in upper and lower respiratory sp ecimens during the acute  phase of infection. The expected result is Negative. Fact Sheet for Patients:  StrictlyIdeas.no Fact Sheet for Healthcare Providers: BankingDealers.co.za This test is not yet approved or cleared by the [place] FDA and has been authorized for detection and/or diagnosis of SARS-CoV-2 by FDA under an Emergency Use Authorization (EUA).  This EUA will remain in effect (meaning this test can be used) for the duration of the COVID-19 declaration under Section 564(b)(1) of the Act, 21 U.S.C. section 360bbb-3(b)(1), unless the authorization is terminated or revoked sooner. Performed at [facility], [address] [place], [place] [address]   Culture, blood (routine x 2)     Status: None   Collection Time: 03/25/19  9:22 PM  Result Value Ref Range Status   Specimen Description BLOOD LEFT ANTECUBITAL  Final   Special Requests   Final    BOTTLES DRAWN AEROBIC AND ANAEROBIC Blood Culture adequate volume   Culture   Final    NO GROWTH 5 DAYS Performed at [facility], [place]  [address], [place], [place] [address]    Report Status  03/30/2019 FINAL  Final  Culture, blood (routine x 2)     Status: None   Collection Time: 03/25/19  9:23 PM  Result Value Ref Range Status   Specimen Description BLOOD BLOOD LEFT WRIST  Final   Special Requests   Final    BOTTLES DRAWN AEROBIC AND ANAEROBIC Blood Culture adequate volume   Culture   Final    NO GROWTH 5 DAYS Performed at [facility], [address], [place], [place] [address]    Report Status 03/30/2019 FINAL  Final  MRSA PCR Screening     Status: None   Collection Time: 03/27/19 11:43 PM  Result Value Ref Range Status   MRSA by PCR NEGATIVE NEGATIVE Final    Comment:        The GeneXpert MRSA Assay (FDA approved for NASAL specimens only), is one component of a comprehensive MRSA colonization surveillance program. It is not intended to diagnose MRSA infection nor to guide or monitor treatment for MRSA infections. Performed at [facility], [place] [address] [place], [place] [address]   Culture, body fluid-bottle     Status: None (Preliminary result)   Collection Time: 03/28/19 11:42 AM  Result Value Ref Range Status   Specimen Description PLEURAL RIGHT  Final   Special Requests NONE  Final   Culture   Final    NO GROWTH 4 DAYS Performed at [place] [address] [place], [place] [address]    Report Status PENDING  Incomplete  Gram stain     Status: None   Collection Time: 03/28/19 11:42 AM  Result Value Ref Range Status   Specimen Description PLEURAL RIGHT  Final   Special Requests NONE  Final   Gram Stain   Final    RARE WBC PRESENT, PREDOMINANTLY PMN NO ORGANISMS SEEN Performed at [facility], [place] [address], [place], [place] [address]    Report Status 03/28/2019 FINAL  Final     Studies: No results found.  Scheduled Meds: . ALPRAZolam  0.5 mg Oral TID  . apixaban  5 mg Oral BID  . bisoprolol  5 mg Oral Daily  . docusate sodium  200 mg Oral BID  . escitalopram  20 mg Oral QHS  . feeding supplement (ENSURE ENLIVE)   237 mL Oral BID BM  . ferrous [id number] C-folic acid  1 capsule Oral QAC breakfast  . furosemide  20 mg Oral Daily  . lidocaine  1 patch Transdermal Q24H  . multivitamin with minerals  1 tablet Oral Daily  . nystatin-triamcinolone ointment  1 application Topical BID  . pantoprazole  40 mg Oral BID AC  . saccharomyces boulardii  250 mg Oral BID  . sodium chloride flush  3 mL Intravenous Q12H  . sodium chloride flush  3 mL Intravenous Q12H  . vitamin B-12  1,000 mcg Oral Daily    Continuous Infusions: . sodium chloride    . sodium chloride       Time spent: 28mns I have personally reviewed and interpreted on  04/01/2019 daily labs, tele strips, imagings as discussed above under date review session and assessment and plans.  I reviewed all nursing notes, pharmacy notes, consultant notes,  vitals, pertinent old records  I have discussed plan of care as described above with RN , patient and family on 04/01/2019   FFlorencia ReasonsMD, PhD  [facility] Pager [phone] If 7PM-7AM, please contact  night-coverage at [email or web address], password [facility] 04/01/2019, 8:25 PM  LOS: 7 days

## 2019-04-02 DIAGNOSIS — Z9889 Other specified postprocedural states: Secondary | ICD-10-CM

## 2019-04-02 DIAGNOSIS — Z8781 Personal history of (healed) traumatic fracture: Secondary | ICD-10-CM

## 2019-04-02 LAB — BASIC METABOLIC PANEL
Anion gap: 8 (ref 5–15)
BUN: 9 mg/dL (ref 8–23)
CO2: 35 mmol/L — ABNORMAL HIGH (ref 22–32)
Calcium: 8.8 mg/dL — ABNORMAL LOW (ref 8.9–10.3)
Chloride: 99 mmol/L (ref 98–111)
Creatinine, Ser: 0.85 mg/dL (ref 0.44–1.00)
GFR calc Af Amer: >60 mL/min (ref >60)
GFR calc non Af Amer: >60 mL/min (ref >60)
Glucose, Bld: 98 mg/dL (ref 70–99)
Potassium: 5.1 mmol/L (ref 3.5–5.1)
Sodium: 142 mmol/L (ref 135–145)

## 2019-04-02 LAB — CULTURE, BODY FLUID W GRAM STAIN -BOTTLE: Culture: NO GROWTH

## 2019-04-02 LAB — MAGNESIUM: Magnesium: 2.1 mg/dL (ref 1.7–2.4)

## 2019-04-02 LAB — CULTURE, BODY FLUID-BOTTLE

## 2019-04-02 MED ORDER — FUROSEMIDE 20 MG PO TABS: 20.0000 mg | ORAL_TABLET | Freq: Every day | ORAL | 0 refills | Status: DC

## 2019-04-02 MED ORDER — APIXABAN 5 MG PO TABS: 5.0000 mg | ORAL_TABLET | Freq: Two times a day (BID) | ORAL | 0 refills | Status: DC

## 2019-04-02 MED ORDER — SACCHAROMYCES BOULARDII 250 MG PO CAPS: 250.0000 mg | ORAL_CAPSULE | Freq: Two times a day (BID) | ORAL | 0 refills | Status: DC

## 2019-04-02 MED ORDER — LIDOCAINE 5 % EX PTCH: 1.0000 | MEDICATED_PATCH | CUTANEOUS | 0 refills | Status: DC

## 2019-04-02 MED ORDER — BISOPROLOL FUMARATE 5 MG PO TABS: 5.0000 mg | ORAL_TABLET | Freq: Every day | ORAL | 0 refills | Status: DC

## 2019-04-02 MED FILL — BISOPROLOL FUMARATE 5 MG TA: 5 | 30 days supply | Qty: 30 | Fill #0

## 2019-04-02 MED FILL — FUROSEMIDE 20 MG TAB: 20 | 30 days supply | Qty: 30 | Fill #0

## 2019-04-02 MED FILL — ELIQUIS 5 MG TABLET: 5 | 30 days supply | Qty: 60 | Fill #0

## 2019-04-02 NOTE — Telephone Encounter (Signed)
Pt still currently admitted. Will try TOC call on 5/18.

## 2019-04-02 NOTE — Discharge Summary (Signed)
Discharge Summary  Misty [name] PFX:[id number] DOB: 29-Jun-1941  PCP: Chipper Herb, MD  Admit date: 03/25/2019 Discharge date: 04/02/2019  Time spent: 97mns, more than 50% time spent on coordination of care.  Recommendations for Outpatient Follow-up:  1. F/u with PCP within a week  for hospital discharge follow up, repeat cbc/bmp at follow up. She is prescribed on home 02 for a month duration, pcp to follow up on home 02 requirement duration  2. F/u with cardiology for new onset of afib and new diagnosis of congestive heart failure 3. Home health arranged  Discharge Diagnoses:  Active Hospital Problems   Diagnosis Date Noted   Atrial fibrillation with RVR ([facility] 03/25/2019   Pleural effusion, right    Acute systolic heart failure ([facility] 03/31/2019   Lobar pneumonia ([facility]    Acute hypoxemic respiratory failure ([facility]    Community acquired pneumonia 03/26/2019   Elevated troponin 03/25/2019   S/P IM nail left hip 01/07/2019 01/26/2019   Iron deficiency anemia 01/14/2019   Anxiety and depression 01/13/2019   Hypokalemia 01/10/2019   Malnutrition of moderate degree 01/29/2016   GERD (gastroesophageal reflux disease) 04/07/2013    Resolved Hospital Problems  No resolved problems to display.    Discharge Condition: stable  Diet recommendation: heart healthy  Filed Weights   03/31/19 0409 04/01/19 0342 04/02/19 0258  Weight: 59.8 kg 57.4 kg 57.3 kg    History of present illness: (per admitting MD Dr KMaudie Mercury CMorene [name] is a 78y.o. female, w Copd, nicotine dep,  Gerd, Hyperlipidemia, Migraines, Anxiety, apparently c/o dyspnea for the past 1 week. Slight cough, unclear if productive. ? Slight palpitation. Pt denies covid contacts, or any recent travel.  Pt slightly somnolent due to having been given ativan by ED. Pt denies fever, chills, cp, n/v, abd pain, diarrhea, brbpr, dysuria.    In ED T 98.2,  P 138  Bp 86/67  Pox 90-98%RA Wt 56.7 kg  EKG Afib at  140, LAD, Q in v1-3  CXR IMPRESSION: 1. Interval development of a right lower lung zone opacity favored to represent a combination of a small to moderate size right-sided pleural effusion with adjacent atelectasis. A superimposed infectious process cannot be excluded. 2. Trace to small left-sided pleural effusion. New retrocardiac opacity which may represent atelectasis or infiltrate. 3. Mild cardiomegaly. There is evidence of generalized volume Overload.  Na 142, K 2.9 Glucose 230 Bun 21, Creatinine 1.21 Trop 0.14 BNP >4,500 covid negative  Wbc 9.3, hgb 12.7, Plt 285 TSH 2.216 Magnesium 2.1 Blood culture x2 pending  Pt placed on cardizem GTT in ED, as well as lasix 244miv x1, and Kcl 1074miv x4 Pt received rocephin 1gm iv x1 along with doxycycline 100m86m x1 in ED.   Pt will be admitted for Afib/ flutter with RVR, hypotension, ? Mild CHF secondary to Afib, ? pleural effusions/ Hcap, hypokalemia.    Hospital Course:  Principal Problem:   Atrial fibrillation with RVR (HCC) Active Problems:   GERD (gastroesophageal reflux disease)   Malnutrition of moderate degree   Hypokalemia   Anxiety and depression   Iron deficiency anemia   S/P IM nail left hip 01/07/2019   Elevated troponin   Community acquired pneumonia   Acute systolic heart failure (HCC)   Lobar pneumonia (HCC)   Acute hypoxemic respiratory failure (HCC)   Pleural effusion, right   Atrial fibrillation with RVR-new onset.- BNP> 4500, d-dimer 1.69onpresentation. - CT angiogramnegative for pulmonary embolism butlarge right pleural effusion. -  TSH was 2.2.  -2D echocardiogram showed decreased ejection fraction with EF of 30 to 35% with diffuse hypokinesis.  -Patient was initially put on Cardizem and heparin drip,subsequently converted to normal sinus rhythm.  -Currently on bisoprolol., apixaban -Cardiology on board, input appreciated  Acute systolic CHF/cardiomyopathywith right pleural  effusion -Status post ultrasound guided diagnostic and therapeutic thoracocentesis with removal of 900 mL of fluid on 5/10.GramStain of the pleural fluid shows rare WBC no organisms,fluid culture negative . Transudative pleural effusion likely from CHF. - EF 30 to 35% likely precipitated due to acute A. fib with RVR. -initially on Lasix 20 mg IV twice daily., now on oral lasix 1m daily, 5liter negative so far -remain oxygen dependent, discharged on home 02 . Mild troponin I elevation and relatively flat pattern around 0.15 - Most likely demand ischemia 2nd CHF, especially given nonobstructive CAD on cath. - no aspirin given DOAC. LDL 43, Tchol 89 on no meds -patient declined statin -appreciate cardiology input   community-acquired pneumoniasecondaryto Streptococcus pneumonia. H/o COPD/pulmonary fibrosis, home o2 dependent per chart, patient /Regis Billdenies Urine Streptococcus pneumoniae antigen positive.  COVID-19 was negative.  Blood cultures negative in 5 days. treated with IV Rocephin., last dose on 5/13   Acute hypoxic respiratory failure, discharged on home o2 Likely due to heart failure, pna and underline copd/pulmonary fibrosis She is discharged on home 02, follow up with pcp to decide duration of home o2, suspect will need it long term   GERD (gastroesophageal reflux disease) Continue Protonix  Moderate protein calorie malnutrition. Continue nutritional supplements.  Hypokalemia k 2.4 on presentation, replaced    Anxiety and depression Continue home meds lexapro and  prn Xanax F/u with pcp  History of iron deficiency anemia -H&H stable, at baseline. Hemoglobin of 9.8 today. Continue iron supplement -f/u with pcp  New mild compression deformity of T10 vertebral body is noted of indeterminate age. Incidental finding on CTA chest on admission, she denies pain H/o back surgery and hip surgery H/o incidental fall in 12/2018 resulted in left  hip fracture required surgery  FTT:  PT recommended home health   Code Status: full  Family Communication: patient , daughter in law DHilda Bladesupdated over the phone with patient's permission  Disposition Plan: home with home health, home o2 , with cardiology clearance   Consultants:  cardiology  Procedures:   Cardiac cath on 5/13  Right sided thoracentesis on 5/10  Antibiotics:  Rocephin, last dose on 5/13   Discharge Exam: BP 128/74 (BP Location: Right Arm)    Pulse 75    Temp 98.1 F (36.7 C) (Oral)    Resp 20    Ht 5' 7"  (1.702 m)    Wt 57.3 kg    SpO2 100%    BMI 19.79 kg/m   General: NAD, frail Cardiovascular: RRR Respiratory: lung exam has much improved, improved aeration, no wheezing, no rhonchi, no rales  Extremity: no edema  Discharge Instructions You were cared for by a hospitalist during your hospital stay. If you have any questions about your discharge medications or the care you received while you were in the hospital after you are discharged, you can call the unit and asked to speak with the hospitalist on call if the hospitalist that took care of you is not available. Once you are discharged, your primary care physician will handle any further medical issues. Please note that NO REFILLS for any discharge medications will be authorized once you are discharged, as it is imperative that you  return to your primary care physician (or establish a relationship with a primary care physician if you do not have one) for your aftercare needs so that they can reassess your need for medications and monitor your lab values.  Discharge Instructions    Amb referral to AFIB Clinic   Complete by:  As directed    Diet - low sodium heart healthy   Complete by:  As directed    Increase activity slowly   Complete by:  As directed      Allergies as of 04/02/2019      Reactions   Codeine Nausea And Vomiting   Tramadol Nausea And Vomiting   Asa [aspirin] Other (See  Comments)   Patient is unaware of allergy   Azithromycin Other (See Comments)   Patient is unaware of allergy   Celebrex [celecoxib] Other (See Comments)   Irritated stomach   Cymbalta [duloxetine Hcl] Other (See Comments)   Felt groggy   Prednisone Rash   She has seen the podiatrist and he had injected cortisone in her feet and she had also taken a peel at home. She had a severe reaction to her face with a rash and had to take Benadryl to resolve the symptom. She actually did get more cortisone shots, but no additional reactions to the shots.   Vioxx [rofecoxib] Other (See Comments)   Unknown   Zelnorm [tegaserod] Other (See Comments)   Patient is unaware of allergy   Zocor [simvastatin] Other (See Comments)   Patient is unaware of allergy      Medication List    STOP taking these medications   HYDROcodone-acetaminophen 7.5-325 MG tablet Commonly known as:  NORCO     TAKE these medications   acetaminophen 500 MG tablet Commonly known as:  TYLENOL Take 500 mg by mouth every 8 (eight) hours.   albuterol 108 (90 Base) MCG/ACT inhaler Commonly known as:  VENTOLIN HFA Inhale 2 puffs into the lungs every 6 (six) hours as needed for wheezing or shortness of breath (cough).   ALPRAZolam 0.5 MG tablet Commonly known as:  XANAX Take 0.5 mg by mouth at bedtime as needed for anxiety.   apixaban 5 MG Tabs tablet Commonly known as:  ELIQUIS Take 1 tablet (5 mg total) by mouth 2 (two) times daily.   bisoprolol 5 MG tablet Commonly known as:  ZEBETA Take 1 tablet (5 mg total) by mouth daily. Start taking on:  Apr 03, 2019   cholecalciferol 1000 units tablet Commonly known as:  VITAMIN D Take 2,000-4,000 Units by mouth daily. Take 2000 units daily except take 4000 units on Saturday and Sunday.   ENSURE ENLIVE PO Take 1 Bottle by mouth 2 (two) times daily between meals.   escitalopram 20 MG tablet Commonly known as:  LEXAPRO Take 1 tablet (20 mg total) by mouth at bedtime.     ferrous sulfate 325 (65 FE) MG tablet Commonly known as:  Feosol Take 1 tablet (325 mg total) by mouth 3 (three) times daily with meals.   furosemide 20 MG tablet Commonly known as:  LASIX Take 1 tablet (20 mg total) by mouth daily. Start taking on:  Apr 03, 2019   lidocaine 5 % Commonly known as:  LIDODERM Place 1 patch onto the skin daily. Remove & Discard patch within 12 hours or as directed by MD Apply to back   magic mouthwash w/lidocaine Soln Take 5 mLs by mouth 3 (three) times daily as needed for mouth pain.  multivitamin with minerals Tabs tablet Take 1 tablet by mouth daily. Centrum Silver   pantoprazole 40 MG tablet Commonly known as:  PROTONIX Take 1 tablet (40 mg total) by mouth 2 (two) times daily before a meal.   saccharomyces boulardii 250 MG capsule Commonly known as:  FLORASTOR Take 1 capsule (250 mg total) by mouth 2 (two) times daily.   vitamin B-12 1000 MCG tablet Commonly known as:  CYANOCOBALAMIN Take 1,000 mcg by mouth daily.            Durable Medical Equipment  (From admission, onward)         Start     Ordered   04/01/19 1549  DME Oxygen  Once    Comments:  Not able to determine duration , she might needs home o2 for at least a month if not longer due to underline heart failure, copd and pulmonary fibrosis Patient is to follow up with pcp in a month to assess duration of home o2  Question Answer Comment  Mode or (Route) Nasal cannula   Liters per Minute 2   Frequency Continuous (stationary and portable oxygen unit needed)   Oxygen conserving device Yes   Oxygen delivery system Gas      04/01/19 1549         Allergies  Allergen Reactions   Codeine Nausea And Vomiting   Tramadol Nausea And Vomiting   Asa [Aspirin] Other (See Comments)    Patient is unaware of allergy   Azithromycin Other (See Comments)    Patient is unaware of allergy   Celebrex [Celecoxib] Other (See Comments)    Irritated stomach    Cymbalta  [Duloxetine Hcl] Other (See Comments)    Felt groggy   Prednisone Rash    She has seen the podiatrist and he had injected cortisone in her feet and she had also taken a peel at home. She had a severe reaction to her face with a rash and had to take Benadryl to resolve the symptom. She actually did get more cortisone shots, but no additional reactions to the shots.   Vioxx [Rofecoxib] Other (See Comments)    Unknown   Zelnorm [Tegaserod] Other (See Comments)    Patient is unaware of allergy   Zocor [Simvastatin] Other (See Comments)    Patient is unaware of allergy   Follow-up Information    Minus Breeding, MD Follow up on 04/07/2019.   Specialty:  Cardiology Why:  at 2:00 pm  -- this will be a virtual visit.  please read attached Instructions on this viist.    Contact information: [place] [address] [phone]        [facility] Follow up.   Why:  Registered Nurse, Physical Therapy, Occupational Therapy, Aide, Education officer, museum.  Contact information: [place], [facility] Follow up.   Why:  Oxygen -Durable Medical Equipment.  Contact information: [place] [address] [phone]        Chipper Herb, MD Follow up in 1 week(s).   Specialty:  Family Medicine Why:  hospital discharge follow up, repeat cbc/bmp in one week. Contact information: [place] [address] [phone]            The results of significant diagnostics from this hospitalization (including imaging, microbiology, ancillary and laboratory) are listed below for reference.    Significant Diagnostic Studies:  Dg Chest 1 View  Result Date: 03/28/2019 CLINICAL DATA:  Status post right thoracentesis EXAM: CHEST  1 VIEW COMPARISON:  03/25/2019, 03/27/2019 FINDINGS: Near complete resolution of the right effusion following thoracentesis. Improved aeration of the  right lower lobe. No pneumothorax. Small left effusion remains with left basilar partial collapse/consolidation. Stable cardiomegaly. Background parenchymal scarring, suspect COPD/emphysema. Trachea is midline. Aorta atherosclerotic. Bones are osteopenic. Remote vertebral augmentation changes. IMPRESSION: Near complete resolution of right effusion following thoracentesis. Negative for pneumothorax. Other findings as above. Electronically Signed   By: Jerilynn Mages.  Shick M.D.   On: 03/28/2019 11:53   Ct Angio Chest Pe W Or Wo Contrast  Result Date: 03/27/2019 CLINICAL DATA:  Dyspnea. EXAM: CT ANGIOGRAPHY CHEST WITH CONTRAST TECHNIQUE: Multidetector CT imaging of the chest was performed using the standard protocol during bolus administration of intravenous contrast. Multiplanar CT image reconstructions and MIPs were obtained to evaluate the vascular anatomy. CONTRAST:  4m OMNIPAQUE IOHEXOL 350 MG/ML SOLN COMPARISON:  Radiograph of Mar 25, 2019.  CT scan of March 05, 2018. FINDINGS: Cardiovascular: Satisfactory opacification of the pulmonary arteries to the segmental level. No evidence of pulmonary embolism. Mild cardiomegaly is noted. No pericardial effusion. Atherosclerosis of thoracic aorta is noted without aneurysm formation. Mediastinum/Nodes: No enlarged mediastinal, hilar, or axillary lymph nodes. Thyroid gland, trachea, and esophagus demonstrate no significant findings. Lungs/Pleura: No pneumothorax is noted. Large right pleural effusion is noted with associated atelectasis of the right lower lobe. Minimal left pleural effusion is noted with adjacent subsegmental atelectasis. Upper Abdomen: No acute abnormality. Musculoskeletal: Status post kyphoplasty at multiple levels in the lower thoracic spine. New mild compression deformity of T10 vertebral body is noted of indeterminate age. Review of the MIP images confirms the above findings. IMPRESSION: No definite evidence of pulmonary embolus. Large right pleural effusion  is noted with associated atelectasis of right lower lobe. New mild compression deformity of T10 vertebral body is noted of indeterminate age. Aortic Atherosclerosis (ICD10-I70.0). Electronically Signed   By: JMarijo ConceptionM.D.   On: 03/27/2019 13:14   Dg Chest Port 1 View  Result Date: 03/25/2019 CLINICAL DATA:  Shortness of breath. EXAM: PORTABLE CHEST 1 VIEW COMPARISON:  01/06/2019 FINDINGS: The cardiac silhouette is enlarged. There is a right lower lung zone airspace opacity favored to represent a combination of a right-sided pleural effusion and atelectasis/consolidation. No pneumothorax. There is a likely a trace left-sided effusion. A retrocardiac opacity is noted. Generalized volume overload is seen. IMPRESSION: 1. Interval development of a right lower lung zone opacity favored to represent a combination of a small to moderate size right-sided pleural effusion with adjacent atelectasis. A superimposed infectious process cannot be excluded. 2. Trace to small left-sided pleural effusion. New retrocardiac opacity which may represent atelectasis or infiltrate. 3. Mild cardiomegaly. There is evidence of generalized volume overload. Electronically Signed   By: CConstance HolsterM.D.   On: 03/25/2019 20:55   UKoreaThoracentesis Asp Pleural Space W/img Guide  Result Date: 03/28/2019 INDICATION: Patient with history of tobacco abuse, COPD, pneumonia, right pleural effusion. Request received for diagnostic and therapeutic right thoracentesis. EXAM: ULTRASOUND GUIDED DIAGNOSTIC AND THERAPEUTIC RIGHT THORACENTESIS MEDICATIONS: None COMPLICATIONS: None immediate. PROCEDURE: An ultrasound guided thoracentesis was thoroughly discussed with the patient and questions answered. The benefits, risks, alternatives and complications were also discussed. The patient understands and wishes to proceed with the procedure. Written consent was obtained. Ultrasound was performed to localize and mark an adequate pocket of fluid in  the right chest. The area  was then prepped and draped in the normal sterile fashion. 1% Lidocaine was used for local anesthesia. Under ultrasound guidance a 6 Fr Safe-T-Centesis catheter was introduced. Thoracentesis was performed. The catheter was removed and a dressing applied. FINDINGS: A total of approximately 900 cc of yellow fluid was removed. Samples were sent to the laboratory as requested by the clinical team. IMPRESSION: Successful ultrasound guided diagnostic and therapeutic right thoracentesis yielding 900 cc of pleural fluid. Read by: Rowe Robert, PA-C Electronically Signed   By: Jerilynn Mages.  Shick M.D.   On: 03/28/2019 11:44    Microbiology: Recent Results (from the past 240 hour(s))  SARS Coronavirus 2 (CEPHEID- Performed in [place] hospital lab), Hosp Order     Status: None   Collection Time: 03/25/19  8:58 PM  Result Value Ref Range Status   SARS Coronavirus 2 NEGATIVE NEGATIVE Final    Comment: (NOTE) If result is NEGATIVE SARS-CoV-2 target nucleic acids are NOT DETECTED. The SARS-CoV-2 RNA is generally detectable in upper and lower  respiratory specimens during the acute phase of infection. The lowest  concentration of SARS-CoV-2 viral copies this assay can detect is 250  copies / mL. A negative result does not preclude SARS-CoV-2 infection  and should not be used as the sole basis for treatment or other  patient management decisions.  A negative result may occur with  improper specimen collection / handling, submission of specimen other  than nasopharyngeal swab, presence of viral mutation(s) within the  areas targeted by this assay, and inadequate number of viral copies  (<250 copies / mL). A negative result must be combined with clinical  observations, patient history, and epidemiological information. If result is POSITIVE SARS-CoV-2 target nucleic acids are DETECTED. The SARS-CoV-2 RNA is generally detectable in upper and lower  respiratory specimens dur ing the acute  phase of infection.  Positive  results are indicative of active infection with SARS-CoV-2.  Clinical  correlation with patient history and other diagnostic information is  necessary to determine patient infection status.  Positive results do  not rule out bacterial infection or co-infection with other viruses. If result is PRESUMPTIVE POSTIVE SARS-CoV-2 nucleic acids MAY BE PRESENT.   A presumptive positive result was obtained on the submitted specimen  and confirmed on repeat testing.  While 2019 novel coronavirus  (SARS-CoV-2) nucleic acids may be present in the submitted sample  additional confirmatory testing may be necessary for epidemiological  and / or clinical management purposes  to differentiate between  SARS-CoV-2 and other Sarbecovirus currently known to infect humans.  If clinically indicated additional testing with an alternate test  methodology [phone]) is advised. The SARS-CoV-2 RNA is generally  detectable in upper and lower respiratory sp ecimens during the acute  phase of infection. The expected result is Negative. Fact Sheet for Patients:  StrictlyIdeas.no Fact Sheet for Healthcare Providers: BankingDealers.co.za This test is not yet approved or cleared by the [place] FDA and has been authorized for detection and/or diagnosis of SARS-CoV-2 by FDA under an Emergency Use Authorization (EUA).  This EUA will remain in effect (meaning this test can be used) for the duration of the COVID-19 declaration under Section 564(b)(1) of the Act, 21 U.S.C. section 360bbb-3(b)(1), unless the authorization is terminated or revoked sooner. Performed at [facility], [address], [place], [place] [address]   Culture, blood (routine x 2)     Status: None   Collection Time: 03/25/19  9:22 PM  Result Value Ref Range Status   Specimen  Description BLOOD LEFT ANTECUBITAL  Final   Special Requests   Final    BOTTLES DRAWN AEROBIC  AND ANAEROBIC Blood Culture adequate volume   Culture   Final    NO GROWTH 5 DAYS Performed at [facility], [address] [place], [place] [address]    Report Status 03/30/2019 FINAL  Final  Culture, blood (routine x 2)     Status: None   Collection Time: 03/25/19  9:23 PM  Result Value Ref Range Status   Specimen Description BLOOD BLOOD LEFT WRIST  Final   Special Requests   Final    BOTTLES DRAWN AEROBIC AND ANAEROBIC Blood Culture adequate volume   Culture   Final    NO GROWTH 5 DAYS Performed at [facility], [address] [place], [place] [address]    Report Status 03/30/2019 FINAL  Final  MRSA PCR Screening     Status: None   Collection Time: 03/27/19 11:43 PM  Result Value Ref Range Status   MRSA by PCR NEGATIVE NEGATIVE Final    Comment:        The GeneXpert MRSA Assay (FDA approved for NASAL specimens only), is one component of a comprehensive MRSA colonization surveillance program. It is not intended to diagnose MRSA infection nor to guide or monitor treatment for MRSA infections. Performed at [facility], [place] [address], [place], [place] [address]   Culture, body fluid-bottle     Status: None   Collection Time: 03/28/19 11:42 AM  Result Value Ref Range Status   Specimen Description PLEURAL RIGHT  Final   Special Requests NONE  Final   Culture   Final    NO GROWTH 5 DAYS Performed at [facility] [address], [place], [place] [address]    Report Status 04/02/2019 FINAL  Final  Gram stain     Status: None   Collection Time: 03/28/19 11:42 AM  Result Value Ref Range Status   Specimen Description PLEURAL RIGHT  Final   Special Requests NONE  Final   Gram Stain   Final    RARE WBC PRESENT, PREDOMINANTLY PMN NO ORGANISMS SEEN Performed at [facility], [place] [address] [place],  [address]    Report Status 03/28/2019 FINAL  Final     Labs: Basic Metabolic Panel: Recent Labs  Lab 03/27/19 0713  03/29/19 0536  03/30/19 0345 03/31/19 0403 03/31/19 1138 03/31/19 1154 04/01/19 0456 04/02/19 0423  NA  --    < > 141 143 140 139 138 141 142  K  --    < > 3.8 4.1 5.2* 5.1 5.4* 5.4* 5.1  CL  --    < > 93* 91* 95*  --   --  96* 99  CO2  --    < > 38* 39* 39*  --   --  34* 35*  GLUCOSE  --    < > 118* 112* 123*  --   --  109* 98  BUN  --    < > 18 16 13   --   --  10 9  CREATININE  --    < > 0.88 0.82 0.91  --   --  1.00 0.85  CALCIUM  --    < > 8.5* 8.7* 8.8*  --   --  9.2 8.8*  MG 1.8  --   --   --   --   --   --   --  2.1   < > =  values in this interval not displayed.   Liver Function Tests: No results for input(s): AST, ALT, ALKPHOS, BILITOT, PROT, ALBUMIN in the last 168 hours. No results for input(s): LIPASE, AMYLASE in the last 168 hours. No results for input(s): AMMONIA in the last 168 hours. CBC: Recent Labs  Lab 03/27/19 0341 03/28/19 0523 03/29/19 0536 03/30/19 0345 03/31/19 0403 03/31/19 1138 03/31/19 1154  WBC 7.2 8.3 6.9 6.4 7.1  --   --   HGB 10.3* 10.1* 10.4* 9.8* 10.5* 11.2* 11.2*  HCT 33.4* 34.1* 34.8* 33.5* 35.8* 33.0* 33.0*  MCV 92.5 95.5 96.7 97.1 97.5  --   --   PLT 201 194 169 156 166  --   --    Cardiac Enzymes: Recent Labs  Lab 03/26/19 1458 03/26/19 2054  TROPONINI 0.15* 0.14*   BNP: BNP (last 3 results) Recent Labs    03/25/19 2033  BNP >4,500.0*    ProBNP (last 3 results) No results for input(s): PROBNP in the last 8760 hours.  CBG: No results for input(s): GLUCAP in the last 168 hours.     Signed:  Florencia Reasons MD, PhD  [facility] 04/02/2019, 11:50 AM

## 2019-04-03 DIAGNOSIS — J918 Pleural effusion in other conditions classified elsewhere: Secondary | ICD-10-CM | POA: Diagnosis not present

## 2019-04-03 DIAGNOSIS — I5021 Acute systolic (congestive) heart failure: Secondary | ICD-10-CM | POA: Diagnosis not present

## 2019-04-03 DIAGNOSIS — Z9981 Dependence on supplemental oxygen: Secondary | ICD-10-CM | POA: Diagnosis not present

## 2019-04-03 DIAGNOSIS — E440 Moderate protein-calorie malnutrition: Secondary | ICD-10-CM | POA: Diagnosis not present

## 2019-04-03 DIAGNOSIS — D509 Iron deficiency anemia, unspecified: Secondary | ICD-10-CM | POA: Diagnosis not present

## 2019-04-03 DIAGNOSIS — I4891 Unspecified atrial fibrillation: Secondary | ICD-10-CM | POA: Diagnosis not present

## 2019-04-03 DIAGNOSIS — F341 Dysthymic disorder: Secondary | ICD-10-CM | POA: Diagnosis not present

## 2019-04-03 DIAGNOSIS — I4892 Unspecified atrial flutter: Secondary | ICD-10-CM | POA: Diagnosis not present

## 2019-04-03 DIAGNOSIS — J9601 Acute respiratory failure with hypoxia: Secondary | ICD-10-CM | POA: Diagnosis not present

## 2019-04-03 DIAGNOSIS — I429 Cardiomyopathy, unspecified: Secondary | ICD-10-CM | POA: Diagnosis not present

## 2019-04-03 DIAGNOSIS — J13 Pneumonia due to Streptococcus pneumoniae: Secondary | ICD-10-CM | POA: Diagnosis not present

## 2019-04-05 ENCOUNTER — Telehealth: Payer: Self-pay | Admitting: Orthopedic Surgery

## 2019-04-05 DIAGNOSIS — J918 Pleural effusion in other conditions classified elsewhere: Secondary | ICD-10-CM | POA: Diagnosis not present

## 2019-04-05 DIAGNOSIS — J13 Pneumonia due to Streptococcus pneumoniae: Secondary | ICD-10-CM | POA: Diagnosis not present

## 2019-04-05 DIAGNOSIS — I429 Cardiomyopathy, unspecified: Secondary | ICD-10-CM | POA: Diagnosis not present

## 2019-04-05 DIAGNOSIS — I4892 Unspecified atrial flutter: Secondary | ICD-10-CM | POA: Diagnosis not present

## 2019-04-05 DIAGNOSIS — J9601 Acute respiratory failure with hypoxia: Secondary | ICD-10-CM | POA: Diagnosis not present

## 2019-04-05 DIAGNOSIS — I4891 Unspecified atrial fibrillation: Secondary | ICD-10-CM | POA: Diagnosis not present

## 2019-04-05 NOTE — Telephone Encounter (Signed)
Patient's daughter and designated contact, Derald Macleod, [phone], called to request to cancel appointment for 04/07/19, as she has just come home from [facility]. States she is doing well and would like to hold on appointment, due to covid-19 situation.  Virtual, or re-schedule at a later date?

## 2019-04-05 NOTE — Telephone Encounter (Signed)
LM TO CALL BACK ./CY 

## 2019-04-06 DIAGNOSIS — J918 Pleural effusion in other conditions classified elsewhere: Secondary | ICD-10-CM | POA: Diagnosis not present

## 2019-04-06 DIAGNOSIS — I429 Cardiomyopathy, unspecified: Secondary | ICD-10-CM | POA: Diagnosis not present

## 2019-04-06 DIAGNOSIS — I4891 Unspecified atrial fibrillation: Secondary | ICD-10-CM | POA: Diagnosis not present

## 2019-04-06 DIAGNOSIS — I4892 Unspecified atrial flutter: Secondary | ICD-10-CM | POA: Diagnosis not present

## 2019-04-06 DIAGNOSIS — J9601 Acute respiratory failure with hypoxia: Secondary | ICD-10-CM | POA: Diagnosis not present

## 2019-04-06 DIAGNOSIS — J13 Pneumonia due to Streptococcus pneumoniae: Secondary | ICD-10-CM | POA: Diagnosis not present

## 2019-04-06 NOTE — Progress Notes (Signed)
Virtual Visit via Video Note   This visit type was conducted due to national recommendations for restrictions regarding the COVID-19 Pandemic (e.g. social distancing) in an effort to limit this patient's exposure and mitigate transmission in our community.  Due to her co-morbid illnesses, this patient is at least at moderate risk for complications without adequate follow up.  This format is felt to be most appropriate for this patient at this time.  All issues noted in this document were discussed and addressed.  A limited physical exam was performed with this format.  Please refer to the patient's chart for her consent to telehealth for [facility].   Date:  04/07/2019   ID:  Misty, [name] June 17, 1941, MRN [id number]  Patient Location: Home Provider Location: Home  PCP:  Chipper Herb, MD  Cardiologist:  Minus Breeding Electrophysiologist:  None   Evaluation Performed:  Follow-Up Visit  Chief Complaint:  SOB  History of Present Illness:    Misty [name] is a 78 y.o. female who is new to me and presents follow up.    She was in the hospital last week with respiratory failure.  She had pneumonia and atrial fib with RVR.  She had pneumonia.  She had an elevated troponin but had cath with mild non obstructive CAD.  She was found to have a reduced EF of 30 - 35%.  She had moderate MR.   The atrial fib and the cardiomyopathy was new.   Med titration was difficult secondary to hypotension.   She had a pleural effusion and had thoracentesis.    Since going home she is weak but she actually feels much better.  She does not think she needs the oxygen that she was sent home with.  She is not sleeping but this was a problem before this hospitalization.  She is not describing PND or orthopnea.  Is had no swelling.  She denies any chest pressure, neck or arm discomfort.  She has had no cough fevers or chills.  She has had no weight gain or edema.  The patient does not have symptoms  concerning for COVID-19 infection (fever, chills, cough, or new shortness of breath).    Past Medical History:  Diagnosis Date  . Anxiety    takes Xanax daily  . Arthritis    back  . Cataract   . Chronic back pain   . Constipation    OTC stool softener prn  . COPD (chronic obstructive pulmonary disease) ([place])   . Depression   . Diverticulosis   . GERD (gastroesophageal reflux disease)    takes Ranidine daily  . Hemorrhoids   . History of bronchitis   . History of migraine    many yrs ago  . History of shingles   . Hyperlipidemia   . Insomnia   . Migraines   . Osteoporosis   . PONV (postoperative nausea and vomiting)    Past Surgical History:  Procedure Laterality Date  . ABDOMINAL HYSTERECTOMY    . COLONOSCOPY    . COLONOSCOPY N/A 02/17/2015   Procedure: COLONOSCOPY;  Surgeon: Rogene Houston, MD;  [facility];  Service: Endoscopy;  Laterality: N/A;  110n - moved to 11:15 - Ann to notify pt  . ESOPHAGOGASTRODUODENOSCOPY    . ESOPHAGOGASTRODUODENOSCOPY N/A 01/29/2016   Procedure: ESOPHAGOGASTRODUODENOSCOPY (EGD);  Surgeon: Rogene Houston, MD;  [facility];  Service: Endoscopy;  Laterality: N/A;  . ESOPHAGOGASTRODUODENOSCOPY N/A 10/14/2017   Procedure: ESOPHAGOGASTRODUODENOSCOPY (  EGD);  Surgeon: Rogene Houston, MD;  [facility];  Service: Endoscopy;  Laterality: N/A;  730  . EYE SURGERY Right 3/15   cataracts  . INTRAMEDULLARY (IM) NAIL INTERTROCHANTERIC Left 01/07/2019   Procedure: INTRAMEDULLARY (IM) NAIL INTERTROCHANTRIC;  Surgeon: Carole Civil, MD;  Location: [facility] ORS;  Service: Orthopedics;  Laterality: Left;  . KYPHOPLASTY N/A 07/08/2014   Procedure: Thoracic Eleven Kyphoplasty;  Surgeon: Hosie Spangle, MD;  Location: [place] [facility]URO ORS;  Service: Neurosurgery;  Laterality: N/A;  . LUMBAR LAMINECTOMY/DECOMPRESSION MICRODISCECTOMY Bilateral 05/20/2013   Procedure: LUMBAR LAMINECTOMY/DECOMPRESSION MICRODISCECTOMY 1 LEVEL;  Surgeon:  Hosie Spangle, MD;  Location: [place] [facility] ORS;  Service: Neurosurgery;  Laterality: Bilateral;  Bilateral Lumbar four-five laminotomy and left lumbar four-five microdiskectomy  . RIGHT/LEFT HEART CATH AND CORONARY ANGIOGRAPHY N/A 03/31/2019   Procedure: RIGHT/LEFT HEART CATH AND CORONARY ANGIOGRAPHY;  Surgeon: Sherren Mocha, MD;  Location: [place] CV LAB;  Service: Cardiovascular;  Laterality: N/A;  . SPINE SURGERY     Dr Carloyn Manner -  vertebraplasty     Current Meds  Medication Sig  . acetaminophen (TYLENOL) 500 MG tablet Take 500 mg by mouth every 8 (eight) hours.   Marland Kitchen albuterol (PROVENTIL HFA;VENTOLIN HFA) 108 (90 Base) MCG/ACT inhaler Inhale 2 puffs into the lungs every 6 (six) hours as needed for wheezing or shortness of breath (cough).  . ALPRAZolam (XANAX) 0.5 MG tablet Take 0.5 mg by mouth at bedtime as needed for anxiety.  Marland Kitchen apixaban (ELIQUIS) 5 MG TABS tablet Take 1 tablet (5 mg total) by mouth 2 (two) times daily.  . bisoprolol (ZEBETA) 5 MG tablet Take 1 tablet (5 mg total) by mouth daily.  . cholecalciferol (VITAMIN D) 1000 UNITS tablet Take 2,000-4,000 Units by mouth daily. Take 2000 units daily except take 4000 units on Saturday and Sunday.  . escitalopram (LEXAPRO) 20 MG tablet Take 1 tablet (20 mg total) by mouth at bedtime.  . ferrous sulfate (FEOSOL) 325 (65 FE) MG tablet Take 1 tablet (325 mg total) by mouth 3 (three) times daily with meals.  . furosemide (LASIX) 20 MG tablet Take 1 tablet (20 mg total) by mouth daily.  . magic mouthwash w/lidocaine SOLN Take 5 mLs by mouth 3 (three) times daily as needed for mouth pain.  . Multiple Vitamin (MULTIVITAMIN WITH MINERALS) TABS tablet Take 1 tablet by mouth daily. Centrum Silver  . pantoprazole (PROTONIX) 40 MG tablet Take 1 tablet (40 mg total) by mouth 2 (two) times daily before a meal.     Allergies:   Codeine; Tramadol; Asa [aspirin]; Azithromycin; Celebrex [celecoxib]; Cymbalta [duloxetine hcl]; Prednisone; Vioxx  [rofecoxib]; Zelnorm [tegaserod]; and Zocor [simvastatin]   Social History   Tobacco Use  . Smoking status: Current Some Day Smoker    Packs/day: 1.00    Years: 52.00    Pack years: 52.00    Types: Cigarettes    Start date: 11/18/1958    Last attempt to quit: 08/20/2011    Years since quitting: 7.6  . Smokeless tobacco: Never Used  . Tobacco comment: 1/2-1pack off and on all her life  Substance Use Topics  . Alcohol use: No    Alcohol/week: 0.0 standard drinks  . Drug use: No     Family Hx: The patient's family history includes Cirrhosis in her son; Heart attack in her mother; Heart disease in her father and son; Hypertension in her mother; Macular degeneration in her father. There is no history of Colon cancer.  ROS:  Please see the history of present illness.    As stated in the HPI and negative for all other systems.   Prior CV studies:   The following studies were reviewed today:   Labs/Other Tests and Data Reviewed:    EKG:  Atrial flutter with 2-1 conduction, rate 139, left axis deviation, poor anterior R wave progression, 03/25/2019  Recent Labs: 01/06/2019: ALT 20 03/25/2019: B Natriuretic Peptide >4,500.0; TSH 2.216 03/31/2019: Hemoglobin 11.2; Platelets 166 04/02/2019: BUN 9; Creatinine, Ser 0.85; Magnesium 2.1; Potassium 5.1; Sodium 142   Recent Lipid Panel Lab Results  Component Value Date/Time   CHOL 89 03/26/2019 02:24 AM   CHOL 123 12/30/2018 12:04 PM   TRIG 65 03/26/2019 02:24 AM   TRIG 72 01/09/2015 09:48 AM   HDL 33 (L) 03/26/2019 02:24 AM   HDL 52 12/30/2018 12:04 PM   HDL 52 01/09/2015 09:48 AM   CHOLHDL 2.7 03/26/2019 02:24 AM   LDLCALC 43 03/26/2019 02:24 AM   LDLCALC 53 12/30/2018 12:04 PM   LDLCALC 73 08/25/2014 09:43 AM    Wt Readings from Last 3 Encounters:  04/07/19 125 lb (56.7 kg)  04/02/19 126 lb 5.2 oz (57.3 kg)  02/24/19 127 lb (57.6 kg)     Objective:    Vital Signs:  Ht 5' 7"  (1.702 m)   Wt 125 lb (56.7 kg)   SpO2 96%    BMI 19.58 kg/m    VITAL SIGNS:  reviewed GEN:  no acute distress EYES:  sclerae anicteric, EOMI - Extraocular Movements Intact RESPIRATORY:  normal respiratory effort, symmetric expansion NEURO:  alert and oriented x 3, no obvious focal deficit PSYCH:  normal affect  ASSESSMENT & PLAN:    CARDIOMYOPATHY: Today I will titrate her medications because blood pressure was low in the hospital.  They are getting a blood pressure cuff and I will titrate her medications in a couple of weeks after I seen a couple of weeks worth of readings.  MR:  This was moderate.  After I am done titrating her medications I will follow-up with repeat echocardiography in the future.  ATRIAL FIB:    Ms. MARYGRACE SANDOVAL has a CHA2DS2 - VASc score of 5.  She tolerates anticoagulation.  No change in therapy.  She had good rate control at the time of discharge and she will be recording her heart rate for me.  ACUTE SYSTOLIC HF: We talked about the weak heart muscle.  She is going avoid salt and watch her fluids.  MILDLY ELEVATED TROPONIN:   This was related to demand ischemia.  No change in therapy.  She had no coronary disease of consequence.  CAP:    She was discharged on O2.  Should keep an oxygen saturation when she gets a device and she might not need the oxygen.  PLEURAL EFFUSION:  She had a thoracentesis.  I will follow-up with physical exam in the weeks ahead.    COVID-19 Education: The signs and symptoms of COVID-19 were discussed with the patient and how to seek care for testing (follow up with PCP or arrange E-visit).  The importance of social distancing was discussed today.  Time:   Today, I have spent 40 minutes with the patient with telehealth technology discussing the above problems.   (1/2 time face to face video)    Medication Adjustments/Labs and Tests Ordered: Current medicines are reviewed at length with the patient today.  Concerns regarding medicines are outlined above.   Tests Ordered:  No orders  of the defined types were placed in this encounter.   Medication Changes: No orders of the defined types were placed in this encounter.   Disposition:  Follow up virtually in 2 weeks  Signed, Minus Breeding, MD  04/07/2019 2:18 PM    [place]

## 2019-04-06 NOTE — Telephone Encounter (Signed)
Cancel it

## 2019-04-06 NOTE — Telephone Encounter (Signed)
Done. Patient and daughter, designated contact, Hilda Blades, aware.

## 2019-04-07 ENCOUNTER — Ambulatory Visit: Payer: Self-pay | Admitting: Orthopedic Surgery

## 2019-04-07 ENCOUNTER — Other Ambulatory Visit: Payer: Self-pay

## 2019-04-07 ENCOUNTER — Other Ambulatory Visit (INDEPENDENT_AMBULATORY_CARE_PROVIDER_SITE_OTHER): Payer: Self-pay | Admitting: *Deleted

## 2019-04-07 ENCOUNTER — Encounter: Payer: Self-pay | Admitting: Cardiology

## 2019-04-07 ENCOUNTER — Telehealth (INDEPENDENT_AMBULATORY_CARE_PROVIDER_SITE_OTHER): Payer: Medicare Other | Admitting: Cardiology

## 2019-04-07 VITALS — Ht 67.0 in | Wt 125.0 lb

## 2019-04-07 DIAGNOSIS — I4891 Unspecified atrial fibrillation: Secondary | ICD-10-CM | POA: Diagnosis not present

## 2019-04-07 DIAGNOSIS — K2100 Gastro-esophageal reflux disease with esophagitis, without bleeding: Secondary | ICD-10-CM

## 2019-04-07 DIAGNOSIS — Z7189 Other specified counseling: Secondary | ICD-10-CM | POA: Insufficient documentation

## 2019-04-07 DIAGNOSIS — I5021 Acute systolic (congestive) heart failure: Secondary | ICD-10-CM

## 2019-04-07 DIAGNOSIS — J90 Pleural effusion, not elsewhere classified: Secondary | ICD-10-CM

## 2019-04-07 HISTORY — DX: Other specified counseling: Z71.89

## 2019-04-07 MED ORDER — PANTOPRAZOLE SODIUM 40 MG PO TBEC: 40.0000 mg | DELAYED_RELEASE_TABLET | Freq: Two times a day (BID) | ORAL | 0 refills | Status: DC

## 2019-04-07 MED ORDER — FUROSEMIDE 20 MG PO TABS: 20.0000 mg | ORAL_TABLET | Freq: Every day | ORAL | 3 refills | Status: DC

## 2019-04-07 MED ORDER — BISOPROLOL FUMARATE 5 MG PO TABS: 5.0000 mg | ORAL_TABLET | Freq: Every day | ORAL | 3 refills | Status: DC

## 2019-04-07 MED ORDER — APIXABAN 5 MG PO TABS: 5.0000 mg | ORAL_TABLET | Freq: Two times a day (BID) | ORAL | 11 refills | Status: DC

## 2019-04-07 NOTE — Patient Instructions (Signed)
Medication Instructions:  The current medical regimen is effective;  continue present plan and medications.  If you need a refill on your cardiac medications before your next appointment, please call your pharmacy.   Follow-Up: Follow up in 2 weeks with Dr Percival Spanish by video chat.  Thank you for choosing [facility]!!

## 2019-04-07 NOTE — Telephone Encounter (Signed)
Encounter closed - patient has appointment today 04/07/19 at 2pm

## 2019-04-08 DIAGNOSIS — I4891 Unspecified atrial fibrillation: Secondary | ICD-10-CM | POA: Diagnosis not present

## 2019-04-08 DIAGNOSIS — J918 Pleural effusion in other conditions classified elsewhere: Secondary | ICD-10-CM | POA: Diagnosis not present

## 2019-04-08 DIAGNOSIS — I429 Cardiomyopathy, unspecified: Secondary | ICD-10-CM | POA: Diagnosis not present

## 2019-04-08 DIAGNOSIS — I4892 Unspecified atrial flutter: Secondary | ICD-10-CM | POA: Diagnosis not present

## 2019-04-08 DIAGNOSIS — J9601 Acute respiratory failure with hypoxia: Secondary | ICD-10-CM | POA: Diagnosis not present

## 2019-04-08 DIAGNOSIS — J13 Pneumonia due to Streptococcus pneumoniae: Secondary | ICD-10-CM | POA: Diagnosis not present

## 2019-04-09 ENCOUNTER — Ambulatory Visit: Payer: Medicare Other | Admitting: Family Medicine

## 2019-04-13 ENCOUNTER — Encounter: Payer: Self-pay | Admitting: *Deleted

## 2019-04-13 DIAGNOSIS — I4891 Unspecified atrial fibrillation: Secondary | ICD-10-CM | POA: Diagnosis not present

## 2019-04-13 DIAGNOSIS — J9601 Acute respiratory failure with hypoxia: Secondary | ICD-10-CM | POA: Diagnosis not present

## 2019-04-13 DIAGNOSIS — J918 Pleural effusion in other conditions classified elsewhere: Secondary | ICD-10-CM | POA: Diagnosis not present

## 2019-04-13 DIAGNOSIS — I429 Cardiomyopathy, unspecified: Secondary | ICD-10-CM | POA: Diagnosis not present

## 2019-04-13 DIAGNOSIS — I4892 Unspecified atrial flutter: Secondary | ICD-10-CM | POA: Diagnosis not present

## 2019-04-13 DIAGNOSIS — J13 Pneumonia due to Streptococcus pneumoniae: Secondary | ICD-10-CM | POA: Diagnosis not present

## 2019-04-13 NOTE — Progress Notes (Signed)
TRANSITIONAL CARE MANAGEMENT TELEPHONE NOTE : Review Date: 04/13/2019 Reviewed By: Chong Sicilian, RN-BC, BSN  Misty [name] is a female primary care patient of Chipper Herb, MD.   DISCHARGE INFORMATION Admit date: 03/25/2019 Discharge date: 04/02/2019  Discharge Diagnoses:      Active Hospital Problems    Diagnosis Date Noted  . Atrial fibrillation with RVR ([place]) 03/25/2019  . Pleural effusion, right    . Acute systolic heart failure ([place]) 03/31/2019  . Lobar pneumonia ([place])    . Acute hypoxemic respiratory failure ([place])    . Community acquired pneumonia 03/26/2019  . Elevated troponin 03/25/2019    Recommendations for Outpatient Follow-up:  1. F/u with PCP within a week  for hospital discharge follow up, repeat cbc/bmp at follow up. She is prescribed on home 02 for a month duration, pcp to follow up on home 02 requirement duration  2. F/u with cardiology for new onset of afib and new diagnosis of congestive heart failure 3. Home health arranged   Home Modifications/Assistive Devices Oxygen: Yes  [place] she is receiving home health Nursing services.   Current Medication List Allergies as of 04/13/2019      Reactions   Codeine Nausea And Vomiting   Tramadol Nausea And Vomiting   Asa [aspirin] Other (See Comments)   Patient is unaware of allergy   Azithromycin Other (See Comments)   Patient is unaware of allergy   Celebrex [celecoxib] Other (See Comments)   Irritated stomach   Cymbalta [duloxetine Hcl] Other (See Comments)   Felt groggy   Prednisone Rash   She has seen the podiatrist and he had injected cortisone in her feet and she had also taken a peel at home. She had a severe reaction to her face with a rash and had to take Benadryl to resolve the symptom. She actually did get more cortisone shots, but no additional reactions to the shots.   Vioxx [rofecoxib] Other (See Comments)   Unknown   Zelnorm [tegaserod] Other (See Comments)   Patient is unaware of allergy   Zocor [simvastatin] Other (See Comments)   Patient is unaware of allergy      Medication List       Accurate as of Apr 13, 2019  4:44 PM. If you have any questions, ask your nurse or doctor.        acetaminophen 500 MG tablet Commonly known as:  TYLENOL Take 500 mg by mouth every 8 (eight) hours.   albuterol 108 (90 Base) MCG/ACT inhaler Commonly known as:  VENTOLIN HFA Inhale 2 puffs into the lungs every 6 (six) hours as needed for wheezing or shortness of breath (cough).   ALPRAZolam 0.5 MG tablet Commonly known as:  XANAX Take 0.5 mg by mouth at bedtime as needed for anxiety.   apixaban 5 MG Tabs tablet Commonly known as:  ELIQUIS Take 1 tablet (5 mg total) by mouth 2 (two) times daily.   bisoprolol 5 MG tablet Commonly known as:  ZEBETA Take 1 tablet (5 mg total) by mouth daily.   cholecalciferol 1000 units tablet Commonly known as:  VITAMIN D Take 2,000-4,000 Units by mouth daily. Take 2000 units daily except take 4000 units on Saturday and Sunday.   escitalopram 20 MG tablet Commonly known as:  LEXAPRO Take 1 tablet (20 mg total) by mouth at bedtime.   ferrous sulfate 325 (65 FE) MG tablet Commonly known as:  Feosol Take 1 tablet (325 mg total) by mouth 3 (  three) times daily with meals.   furosemide 20 MG tablet Commonly known as:  LASIX Take 1 tablet (20 mg total) by mouth daily.   magic mouthwash w/lidocaine Soln Take 5 mLs by mouth 3 (three) times daily as needed for mouth pain.   multivitamin with minerals Tabs tablet Take 1 tablet by mouth daily. Centrum Silver   pantoprazole 40 MG tablet Commonly known as:  PROTONIX Take 1 tablet (40 mg total) by mouth 2 (two) times daily before a meal.   saccharomyces boulardii 250 MG capsule Commonly known as:  FLORASTOR Take 1 capsule (250 mg total) by mouth 2 (two) times daily.   vitamin B-12 1000 MCG tablet Commonly known as:  CYANOCOBALAMIN Take 1,000 mcg by mouth daily.       Transitional care was not done completed within 48 hours ago discharge so we are not able to code for TCM.  The following was forwarded from [facility], [place] Management Assistant: RED on [place].   # flags 1  Swelling in hands/feet or changes in weight? Yes  Will email snapshot of dashboard.   They are on the [place]  A hospital f/u appointment is scheduled with Darla Lesches, [place] on 04/16/19  Telephone call scheduled with RN Care Manager to discuss post discharge management and swelling or change in weight.

## 2019-04-14 ENCOUNTER — Ambulatory Visit: Payer: Medicare Other | Admitting: *Deleted

## 2019-04-14 DIAGNOSIS — D509 Iron deficiency anemia, unspecified: Secondary | ICD-10-CM

## 2019-04-14 DIAGNOSIS — J8410 Pulmonary fibrosis, unspecified: Secondary | ICD-10-CM

## 2019-04-14 NOTE — Patient Instructions (Signed)
Ms. Volk was given information about Chronic Care Management services today including:  1. CCM service includes personalized support from designated clinical staff supervised by her physician, including individualized plan of care and coordination with other care providers 2. 24/7 contact phone numbers for assistance for urgent and routine care needs. 3. Service will only be billed when office clinical staff spend 20 minutes or more in a month to coordinate care. 4. Only one practitioner may furnish and bill the service in a calendar month. 5. The patient may stop CCM services at any time (effective at the end of the month) by phone call to the office staff. 6. The patient will be responsible for cost sharing (co-pay) of up to 20% of the service fee (after annual deductible is met).  Patient did not agree to services and wishes to consider information provided before deciding about enrollment in CCM services.   Chong Sicilian, RN-BC, BSN Nurse Care Manager [facility] [phone]

## 2019-04-14 NOTE — Chronic Care Management (AMB) (Signed)
  Care Management Note   Misty [name] is a 78 y.o. year old female who is a primary care patient of Misty Flatten Estella Husk, MD. The CM team was consulted for assistance with chronic disease management and care coordination.    I reached out to Misty [name] by phone today and spoke with her daughter Misty [name]. Misty [name] was recently discharged from the hospital with recommendations to f/u with cardiology for new onset Afib and a new diagnosis of CHF. She should follow up with PCP regarding O2 and labs. Home health services were arranged and she is receiving physical therapy and nursing services now. She is scheduled for a hospital f/u with Misty [name], Misty [name] on 04/16/19.   I was asked by Misty [name] Management Coordinator to talk with Misty [name] regarding the red flag on her [place] after discharge. She answered "yes" to whether she had edema in her feet or legs. Talked with with Misty [name] and edema is likely dependant from sitting with her feet dangling or on the floor. Edema resolves while laying down at night. Recommended that she keep them propped up when sitting. Explained how walking and and calf muscle contraction help move the fluid back up.    Misty. [name] was given information about Chronic Care Management services today including:  1. CCM service includes personalized support from designated clinical staff supervised by her physician, including individualized plan of care and coordination with other care providers 2. 24/7 contact phone numbers for assistance for urgent and routine care needs. 3. Service will only be billed when office clinical staff spend 20 minutes or more in a month to coordinate care. 4. Only one practitioner may furnish and bill the service in a calendar month. 5. The patient may stop CCM services at any time (effective at the end of the month) by phone call to the office staff. 6. The patient will be responsible for cost sharing (co-pay) of up to 20% of the service fee  (after annual deductible is met). Patient did not agree to services and wishes to consider information provided before deciding about enrollment in CCM services.    Review of patient status, including review of consultants reports, relevant laboratory and other test results, and collaboration with appropriate care team members and the patient's provider was performed as part of comprehensive patient evaluation and provision of chronic care management services.    Follow Up Plan: The CM team will reach out to the patient again over the next 30 days.  Patient will f/u with Misty [name], [place] on 04/16/19 as planned  Misty Sicilian, RN-BC, BSN Nurse Care Manager [place] [phone]

## 2019-04-15 ENCOUNTER — Other Ambulatory Visit: Payer: Self-pay

## 2019-04-15 ENCOUNTER — Encounter (INDEPENDENT_AMBULATORY_CARE_PROVIDER_SITE_OTHER): Payer: Self-pay

## 2019-04-15 ENCOUNTER — Ambulatory Visit (INDEPENDENT_AMBULATORY_CARE_PROVIDER_SITE_OTHER): Payer: Medicare Other

## 2019-04-15 DIAGNOSIS — E440 Moderate protein-calorie malnutrition: Secondary | ICD-10-CM | POA: Diagnosis not present

## 2019-04-15 DIAGNOSIS — D509 Iron deficiency anemia, unspecified: Secondary | ICD-10-CM

## 2019-04-15 DIAGNOSIS — J918 Pleural effusion in other conditions classified elsewhere: Secondary | ICD-10-CM | POA: Diagnosis not present

## 2019-04-15 DIAGNOSIS — I5021 Acute systolic (congestive) heart failure: Secondary | ICD-10-CM

## 2019-04-15 DIAGNOSIS — J9601 Acute respiratory failure with hypoxia: Secondary | ICD-10-CM

## 2019-04-15 DIAGNOSIS — J13 Pneumonia due to Streptococcus pneumoniae: Secondary | ICD-10-CM | POA: Diagnosis not present

## 2019-04-15 DIAGNOSIS — I429 Cardiomyopathy, unspecified: Secondary | ICD-10-CM

## 2019-04-15 DIAGNOSIS — I4892 Unspecified atrial flutter: Secondary | ICD-10-CM | POA: Diagnosis not present

## 2019-04-15 DIAGNOSIS — I4891 Unspecified atrial fibrillation: Secondary | ICD-10-CM

## 2019-04-16 ENCOUNTER — Ambulatory Visit (INDEPENDENT_AMBULATORY_CARE_PROVIDER_SITE_OTHER): Payer: Medicare Other | Admitting: Family Medicine

## 2019-04-16 ENCOUNTER — Encounter: Payer: Self-pay | Admitting: Family Medicine

## 2019-04-16 VITALS — BP 121/67 | HR 73 | Temp 98.0°F | Ht 67.0 in | Wt 119.2 lb

## 2019-04-16 DIAGNOSIS — D509 Iron deficiency anemia, unspecified: Secondary | ICD-10-CM

## 2019-04-16 DIAGNOSIS — J13 Pneumonia due to Streptococcus pneumoniae: Secondary | ICD-10-CM | POA: Diagnosis not present

## 2019-04-16 DIAGNOSIS — E876 Hypokalemia: Secondary | ICD-10-CM | POA: Diagnosis not present

## 2019-04-16 DIAGNOSIS — I4891 Unspecified atrial fibrillation: Secondary | ICD-10-CM | POA: Diagnosis not present

## 2019-04-16 DIAGNOSIS — J918 Pleural effusion in other conditions classified elsewhere: Secondary | ICD-10-CM | POA: Diagnosis not present

## 2019-04-16 DIAGNOSIS — J9601 Acute respiratory failure with hypoxia: Secondary | ICD-10-CM | POA: Diagnosis not present

## 2019-04-16 DIAGNOSIS — I429 Cardiomyopathy, unspecified: Secondary | ICD-10-CM | POA: Diagnosis not present

## 2019-04-16 DIAGNOSIS — Z09 Encounter for follow-up examination after completed treatment for conditions other than malignant neoplasm: Secondary | ICD-10-CM

## 2019-04-16 DIAGNOSIS — I4892 Unspecified atrial flutter: Secondary | ICD-10-CM | POA: Diagnosis not present

## 2019-04-16 DIAGNOSIS — E7800 Pure hypercholesterolemia, unspecified: Secondary | ICD-10-CM | POA: Diagnosis not present

## 2019-04-16 DIAGNOSIS — J90 Pleural effusion, not elsewhere classified: Secondary | ICD-10-CM | POA: Diagnosis not present

## 2019-04-16 DIAGNOSIS — R5383 Other fatigue: Secondary | ICD-10-CM | POA: Diagnosis not present

## 2019-04-16 NOTE — Patient Instructions (Signed)

## 2019-04-16 NOTE — Progress Notes (Signed)
Subjective:  Patient ID: Misty [name], female    DOB: 1941-07-13, 78 y.o.   MRN: [id number]  Chief Complaint:  Hospitalization Follow-up   HPI: Misty [name] is a 78 y.o. female presenting on 04/16/2019 for Hospitalization Follow-up  Pt presents today for hospital follow up. Pt was admitted on 03/25/2019 for A-Fib with RVR, right pleural effusion, CAP, hypoxia, acute systolic heart failure, and iron deficiency anemia.  Pt has been followed by [facility] since discharged home on 04/02/2019. Pt states she has not required oxygen therapy at home. States she is doing fairly well since discharge. States she still feels weak and tired. States she is not sleeping that well. States she sleeps around 4-5 hours per night.  She denies weight gain over 0.5-1 lb per day. No orthopnea or increased leg swelling. No dizziness, syncope, palpitations, or shortness of breath. No chest pain or cough. She states her oxygen levels have been good at home without oxygen. No fever or chills.     Relevant past medical, surgical, family, and social history reviewed and updated as indicated.  Allergies and medications reviewed and updated.   Past Medical History:  Diagnosis Date  . Anxiety    takes Xanax daily  . Arthritis    back  . Atrial fibrillation with RVR ([place]) 03/25/2019  . Cataract   . Chronic back pain   . Constipation    OTC stool softener prn  . COPD (chronic obstructive pulmonary disease) ([place])   . Depression   . Diverticulosis   . GERD (gastroesophageal reflux disease)    takes Ranidine daily  . Hemorrhoids   . History of bronchitis   . History of migraine    many yrs ago  . History of shingles   . Hyperlipidemia   . Insomnia   . Migraines   . Osteoporosis   . PONV (postoperative nausea and vomiting)     Past Surgical History:  Procedure Laterality Date  . ABDOMINAL HYSTERECTOMY    . COLONOSCOPY    . COLONOSCOPY N/A 02/17/2015   Procedure: COLONOSCOPY;   Surgeon: Rogene Houston, MD;  [facility];  Service: Endoscopy;  Laterality: N/A;  110n - moved to 11:15 - Ann to notify pt  . ESOPHAGOGASTRODUODENOSCOPY    . ESOPHAGOGASTRODUODENOSCOPY N/A 01/29/2016   Procedure: ESOPHAGOGASTRODUODENOSCOPY (EGD);  Surgeon: Rogene Houston, MD;  [facility];  Service: Endoscopy;  Laterality: N/A;  . ESOPHAGOGASTRODUODENOSCOPY N/A 10/14/2017   Procedure: ESOPHAGOGASTRODUODENOSCOPY (EGD);  Surgeon: Rogene Houston, MD;  [facility];  Service: Endoscopy;  Laterality: N/A;  730  . EYE SURGERY Right 3/15   cataracts  . INTRAMEDULLARY (IM) NAIL INTERTROCHANTERIC Left 01/07/2019   Procedure: INTRAMEDULLARY (IM) NAIL INTERTROCHANTRIC;  Surgeon: Carole Civil, MD;  Location: [facility];  Service: Orthopedics;  Laterality: Left;  . KYPHOPLASTY N/A 07/08/2014   Procedure: Thoracic Eleven Kyphoplasty;  Surgeon: Hosie Spangle, MD;  Location: [facility];  Service: Neurosurgery;  Laterality: N/A;  . LUMBAR LAMINECTOMY/DECOMPRESSION MICRODISCECTOMY Bilateral 05/20/2013   Procedure: LUMBAR LAMINECTOMY/DECOMPRESSION MICRODISCECTOMY 1 LEVEL;  Surgeon: Hosie Spangle, MD;  Location: [facility];  Service: Neurosurgery;  Laterality: Bilateral;  Bilateral Lumbar four-five laminotomy and left lumbar four-five microdiskectomy  . RIGHT/LEFT HEART CATH AND CORONARY ANGIOGRAPHY N/A 03/31/2019   Procedure: RIGHT/LEFT HEART CATH AND CORONARY ANGIOGRAPHY;  Surgeon: Sherren Mocha, MD;  Location: [facility];  Service: Cardiovascular;  Laterality: N/A;  .  SPINE SURGERY     Dr Carloyn Manner -  vertebraplasty    Social History   Socioeconomic History  . Marital status: Married    Spouse name: Sterling Big   . Number of children: Not on file  . Years of education: Not on file  . Highest education level: Not on file  Occupational History  . Occupation: retired     Comment: [employer] work - came out in [employer]  . Financial resource strain: Not  on file  . Food insecurity:    Worry: Not on file    Inability: Not on file  . Transportation needs:    Medical: Not on file    Non-medical: Not on file  Tobacco Use  . Smoking status: Current Some Day Smoker    Packs/day: 1.00    Years: 52.00    Pack years: 52.00    Types: Cigarettes    Start date: 11/18/1958    Last attempt to quit: 08/20/2011    Years since quitting: 7.6  . Smokeless tobacco: Never Used  . Tobacco comment: 1/2-1pack off and on all her life  Substance and Sexual Activity  . Alcohol use: No    Alcohol/week: 0.0 standard drinks  . Drug use: No  . Sexual activity: Not Currently  Lifestyle  . Physical activity:    Days per week: Not on file    Minutes per session: Not on file  . Stress: Not on file  Relationships  . Social connections:    Talks on phone: Not on file    Gets together: Not on file    Attends religious service: Not on file    Active member of club or organization: Not on file    Attends meetings of clubs or organizations: Not on file    Relationship status: Not on file  . Intimate partner violence:    Fear of current or ex partner: Not on file    Emotionally abused: Not on file    Physically abused: Not on file    Forced sexual activity: Not on file  Other Topics Concern  . Not on file  Social History Narrative   Lives at home with husband, Sterling Big.   Had one son- now deceased     Outpatient Encounter Medications as of 04/16/2019  Medication Sig  . acetaminophen (TYLENOL) 500 MG tablet Take 500 mg by mouth every 8 (eight) hours.   Marland Kitchen albuterol (PROVENTIL HFA;VENTOLIN HFA) 108 (90 Base) MCG/ACT inhaler Inhale 2 puffs into the lungs every 6 (six) hours as needed for wheezing or shortness of breath (cough).  . ALPRAZolam (XANAX) 0.5 MG tablet Take 0.5 mg by mouth at bedtime as needed for anxiety.  Marland Kitchen apixaban (ELIQUIS) 5 MG TABS tablet Take 1 tablet (5 mg total) by mouth 2 (two) times daily.  . bisoprolol (ZEBETA) 5 MG tablet Take 1 tablet (5  mg total) by mouth daily.  . cholecalciferol (VITAMIN D) 1000 UNITS tablet Take 2,000-4,000 Units by mouth daily. Take 2000 units daily except take 4000 units on Saturday and Sunday.  . escitalopram (LEXAPRO) 20 MG tablet Take 1 tablet (20 mg total) by mouth at bedtime.  . ferrous sulfate (FEOSOL) 325 (65 FE) MG tablet Take 1 tablet (325 mg total) by mouth 3 (three) times daily with meals.  . furosemide (LASIX) 20 MG tablet Take 1 tablet (20 mg total) by mouth daily.  . magic mouthwash w/lidocaine SOLN Take 5 mLs by mouth 3 (three) times daily as  needed for mouth pain.  . Multiple Vitamin (MULTIVITAMIN WITH MINERALS) TABS tablet Take 1 tablet by mouth daily. Centrum Silver  . pantoprazole (PROTONIX) 40 MG tablet Take 1 tablet (40 mg total) by mouth 2 (two) times daily before a meal.  . saccharomyces boulardii (FLORASTOR) 250 MG capsule Take 1 capsule (250 mg total) by mouth 2 (two) times daily.  . vitamin B-12 (CYANOCOBALAMIN) 1000 MCG tablet Take 1,000 mcg by mouth daily.   No facility-administered encounter medications on file as of 04/16/2019.     Allergies  Allergen Reactions  . Codeine Nausea And Vomiting  . Tramadol Nausea And Vomiting  . Asa [Aspirin] Other (See Comments)    Patient is unaware of allergy  . Azithromycin Other (See Comments)    Patient is unaware of allergy  . Celebrex [Celecoxib] Other (See Comments)    Irritated stomach   . Cymbalta [Duloxetine Hcl] Other (See Comments)    Felt groggy  . Prednisone Rash    She has seen the podiatrist and he had injected cortisone in her feet and she had also taken a peel at home. She had a severe reaction to her face with a rash and had to take Benadryl to resolve the symptom. She actually did get more cortisone shots, but no additional reactions to the shots.  . Vioxx [Rofecoxib] Other (See Comments)    Unknown  . Zelnorm [Tegaserod] Other (See Comments)    Patient is unaware of allergy  . Zocor [Simvastatin] Other (See  Comments)    Patient is unaware of allergy    Review of Systems  Constitutional: Positive for fatigue. Negative for chills, fever and unexpected weight change.  Respiratory: Negative for apnea, cough, choking, chest tightness, shortness of breath and wheezing.   Cardiovascular: Positive for leg swelling (minimal). Negative for chest pain and palpitations.  Gastrointestinal: Negative for abdominal pain.  Genitourinary: Negative for decreased urine volume and difficulty urinating.  Neurological: Positive for weakness (generalized). Negative for dizziness, tremors, seizures, syncope, facial asymmetry, speech difficulty, light-headedness, numbness and headaches.  Psychiatric/Behavioral: Positive for sleep disturbance. Negative for confusion.  All other systems reviewed and are negative.       Objective:  BP 121/67   Pulse 73   Temp 98 F (36.7 C)   Ht '5\' 7"'$  (1.702 m)   Wt 119 lb 3.2 oz (54.1 kg)   SpO2 98%   BMI 18.67 kg/m    Wt Readings from Last 3 Encounters:  04/16/19 119 lb 3.2 oz (54.1 kg)  04/07/19 125 lb (56.7 kg)  04/02/19 126 lb 5.2 oz (57.3 kg)    Physical Exam Vitals signs and nursing note reviewed.  Constitutional:      General: She is not in acute distress.    Appearance: Normal appearance. She is normal weight. She is not ill-appearing, toxic-appearing or diaphoretic.  HENT:     [name] Normocephalic and atraumatic.     Mouth/Throat:     Mouth: Mucous membranes are moist.     Pharynx: Oropharynx is clear.  Eyes:     Extraocular Movements: Extraocular movements intact.     Conjunctiva/sclera: Conjunctivae normal.     Pupils: Pupils are equal, round, and reactive to light.  Neck:     Musculoskeletal: Full passive range of motion without pain and neck supple.     Thyroid: No thyroid mass, thyromegaly or thyroid tenderness.     Vascular: No carotid bruit or JVD.     Trachea: Trachea and phonation normal.  Cardiovascular:  Rate and Rhythm: Normal rate.  Rhythm regularly irregular.  No extrasystoles are present.    Chest Wall: PMI is not displaced.     Pulses: Normal pulses.     Heart sounds: Normal heart sounds. No murmur. No friction rub. No gallop. No S3 sounds.   Pulmonary:     Effort: Pulmonary effort is normal. No respiratory distress.     Breath sounds: Examination of the right-lower field reveals decreased breath sounds. Decreased breath sounds present. No wheezing, rhonchi or rales.  Abdominal:     General: Bowel sounds are normal. There is no distension.     Palpations: Abdomen is soft.     Tenderness: There is no abdominal tenderness.  Musculoskeletal:     Right lower leg: 1+ Edema present.     Left lower leg: No edema.  Skin:    General: Skin is warm and dry.     Capillary Refill: Capillary refill takes less than 2 seconds.     Coloration: Skin is not jaundiced or pale.     Findings: No bruising or erythema.  Neurological:     General: No focal deficit present.     Mental Status: She is alert and oriented to person, place, and time.  Psychiatric:        Mood and Affect: Mood normal.        Behavior: Behavior normal. Behavior is cooperative.        Thought Content: Thought content normal.        Judgment: Judgment normal.     Results for orders placed or performed during the hospital encounter of 03/25/19  Culture, blood (routine x 2)  Result Value Ref Range   Specimen Description BLOOD LEFT ANTECUBITAL    Special Requests      BOTTLES DRAWN AEROBIC AND ANAEROBIC Blood Culture adequate volume   Culture      NO GROWTH 5 DAYS Performed at [facility], [address], [place], [place] [address]    Report Status 03/30/2019 FINAL   Culture, blood (routine x 2)  Result Value Ref Range   Specimen Description BLOOD BLOOD LEFT WRIST    Special Requests      BOTTLES DRAWN AEROBIC AND ANAEROBIC Blood Culture adequate volume   Culture      NO GROWTH 5 DAYS Performed at [facility], [address] [place], [place]  [address]    Report Status 03/30/2019 FINAL   SARS Coronavirus 2 (CEPHEID- Performed in [place] hospital lab), [facility] Order  Result Value Ref Range   SARS Coronavirus 2 NEGATIVE NEGATIVE  MRSA PCR Screening  Result Value Ref Range   MRSA by PCR NEGATIVE NEGATIVE  Culture, body fluid-bottle  Result Value Ref Range   Specimen Description PLEURAL RIGHT    Special Requests NONE    Culture      NO GROWTH 5 DAYS Performed at [facility] [address], [place], [place] [address]    Report Status 04/02/2019 FINAL   Gram stain  Result Value Ref Range   Specimen Description PLEURAL RIGHT    Special Requests NONE    Gram Stain      RARE WBC PRESENT, PREDOMINANTLY PMN NO ORGANISMS SEEN Performed at [facility], [place] [address] [place], [place] [address]    Report Status 03/28/2019 FINAL   Basic metabolic panel  Result Value Ref Range   Sodium 142 135 - 145 mmol/L   Potassium 2.9 (L) 3.5 - 5.1 mmol/L   Chloride  95 (L) 98 - 111 mmol/L   CO2 24 22 - 32 mmol/L   Glucose, Bld 230 (H) 70 - 99 mg/dL   BUN 21 8 - 23 mg/dL   Creatinine, Ser 1.21 (H) 0.44 - 1.00 mg/dL   Calcium 8.6 (L) 8.9 - 10.3 mg/dL   GFR calc non Af Amer 43 (L) >60 mL/min   GFR calc Af Amer 50 (L) >60 mL/min   Anion gap >20 (H) 5 - 15  Troponin I - Once  Result Value Ref Range   Troponin I 0.14 (HH) <0.03 ng/mL  CBC with Differential  Result Value Ref Range   WBC 9.3 4.0 - 10.5 K/uL   RBC 4.42 3.87 - 5.11 MIL/uL   Hemoglobin 12.7 12.0 - 15.0 g/dL   HCT 43.2 36.0 - 46.0 %   MCV 97.7 80.0 - 100.0 fL   MCH 28.7 26.0 - 34.0 pg   MCHC 29.4 (L) 30.0 - 36.0 g/dL   RDW 17.1 (H) 11.5 - 15.5 %   Platelets 285 150 - 400 K/uL   nRBC 0.3 (H) 0.0 - 0.2 %   Neutrophils Relative % 66 %   Neutro Abs 6.2 1.7 - 7.7 K/uL   Lymphocytes Relative 27 %   Lymphs Abs 2.5 0.7 - 4.0 K/uL   Monocytes Relative 6 %   Monocytes Absolute 0.5 0.1 - 1.0 K/uL   Eosinophils Relative 0 %   Eosinophils Absolute 0.0 0.0 - 0.5 K/uL    Basophils Relative 0 %   Basophils Absolute 0.0 0.0 - 0.1 K/uL   Immature Granulocytes 1 %   Abs Immature Granulocytes 0.06 0.00 - 0.07 K/uL  TSH  Result Value Ref Range   TSH 2.216 0.350 - 4.500 uIU/mL  Magnesium  Result Value Ref Range   Magnesium 2.1 1.7 - 2.4 mg/dL  Lactic acid, plasma  Result Value Ref Range   Lactic Acid, Venous 5.8 (HH) 0.5 - 1.9 mmol/L  Lactic acid, plasma  Result Value Ref Range   Lactic Acid, Venous 2.8 (HH) 0.5 - 1.9 mmol/L  Brain natriuretic peptide  Result Value Ref Range   B Natriuretic Peptide >4,500.0 (H) 0.0 - 100.0 pg/mL  D-dimer, quantitative (not at [facility])  Result Value Ref Range   D-Dimer, Quant 1.69 (H) 0.00 - 0.50 ug/mL-FEU  Troponin I - Now Then Q6H  Result Value Ref Range   Troponin I 0.11 (HH) <0.03 ng/mL  Strep pneumoniae urinary antigen  Result Value Ref Range   Strep Pneumo Urinary Antigen POSITIVE (A) NEGATIVE  Hemoglobin A1c  Result Value Ref Range   Hgb A1c MFr Bld 5.4 4.8 - 5.6 %   Mean Plasma Glucose 108.28 mg/dL  Lipid panel  Result Value Ref Range   Cholesterol 89 0 - 200 mg/dL   Triglycerides 65 <150 mg/dL   HDL 33 (L) >40 mg/dL   Total CHOL/HDL Ratio 2.7 RATIO   VLDL 13 0 - 40 mg/dL   LDL Cholesterol 43 0 - 99 mg/dL  Heparin level (unfractionated)  Result Value Ref Range   Heparin Unfractionated 0.32 0.30 - 0.70 IU/mL  CBC  Result Value Ref Range   WBC 8.7 4.0 - 10.5 K/uL   RBC 3.84 (L) 3.87 - 5.11 MIL/uL   Hemoglobin 10.9 (L) 12.0 - 15.0 g/dL   HCT 35.4 (L) 36.0 - 46.0 %   MCV 92.2 80.0 - 100.0 fL   MCH 28.4 26.0 - 34.0 pg   MCHC 30.8 30.0 - 36.0 g/dL   RDW  16.7 (H) 11.5 - 15.5 %   Platelets 237 150 - 400 K/uL   nRBC 0.0 0.0 - 0.2 %  Heparin level (unfractionated)  Result Value Ref Range   Heparin Unfractionated 0.37 0.30 - 0.70 IU/mL  Troponin I - Now Then Q6H  Result Value Ref Range   Troponin I 0.15 (HH) <0.03 ng/mL  Troponin I - Now Then Q6H  Result Value Ref Range   Troponin I 0.15 (HH) <0.03  ng/mL  Troponin I - Now Then Q6H  Result Value Ref Range   Troponin I 0.14 (HH) <0.03 ng/mL  CBC  Result Value Ref Range   WBC 7.2 4.0 - 10.5 K/uL   RBC 3.61 (L) 3.87 - 5.11 MIL/uL   Hemoglobin 10.3 (L) 12.0 - 15.0 g/dL   HCT 33.4 (L) 36.0 - 46.0 %   MCV 92.5 80.0 - 100.0 fL   MCH 28.5 26.0 - 34.0 pg   MCHC 30.8 30.0 - 36.0 g/dL   RDW 16.9 (H) 11.5 - 15.5 %   Platelets 201 150 - 400 K/uL   nRBC 0.0 0.0 - 0.2 %  Basic metabolic panel  Result Value Ref Range   Sodium 140 135 - 145 mmol/L   Potassium 2.4 (LL) 3.5 - 5.1 mmol/L   Chloride 96 (L) 98 - 111 mmol/L   CO2 34 (H) 22 - 32 mmol/L   Glucose, Bld 122 (H) 70 - 99 mg/dL   BUN 24 (H) 8 - 23 mg/dL   Creatinine, Ser 0.92 0.44 - 1.00 mg/dL   Calcium 8.0 (L) 8.9 - 10.3 mg/dL   GFR calc non Af Amer >60 >60 mL/min   GFR calc Af Amer >60 >60 mL/min   Anion gap 10 5 - 15  Magnesium  Result Value Ref Range   Magnesium 1.8 1.7 - 2.4 mg/dL  Potassium  Result Value Ref Range   Potassium 3.1 (L) 3.5 - 5.1 mmol/L  Heparin level (unfractionated)  Result Value Ref Range   Heparin Unfractionated 0.41 0.30 - 0.70 IU/mL  CBC  Result Value Ref Range   WBC 8.3 4.0 - 10.5 K/uL   RBC 3.57 (L) 3.87 - 5.11 MIL/uL   Hemoglobin 10.1 (L) 12.0 - 15.0 g/dL   HCT 34.1 (L) 36.0 - 46.0 %   MCV 95.5 80.0 - 100.0 fL   MCH 28.3 26.0 - 34.0 pg   MCHC 29.6 (L) 30.0 - 36.0 g/dL   RDW 17.2 (H) 11.5 - 15.5 %   Platelets 194 150 - 400 K/uL   nRBC 0.0 0.0 - 0.2 %  Basic metabolic panel  Result Value Ref Range   Sodium 138 135 - 145 mmol/L   Potassium 3.6 3.5 - 5.1 mmol/L   Chloride 91 (L) 98 - 111 mmol/L   CO2 36 (H) 22 - 32 mmol/L   Glucose, Bld 120 (H) 70 - 99 mg/dL   BUN 21 8 - 23 mg/dL   Creatinine, Ser 1.01 (H) 0.44 - 1.00 mg/dL   Calcium 8.4 (L) 8.9 - 10.3 mg/dL   GFR calc non Af Amer 54 (L) >60 mL/min   GFR calc Af Amer >60 >60 mL/min   Anion gap 11 5 - 15  Heparin level (unfractionated)  Result Value Ref Range   Heparin Unfractionated  0.36 0.30 - 0.70 IU/mL  Legionella Pneumophila Serogp 1 Ur Ag  Result Value Ref Range   L. pneumophila Serogp 1 Ur Ag Negative Negative   Source of Sample URINE, RANDOM   Lactate  dehydrogenase (pleural or peritoneal fluid)  Result Value Ref Range   LD, Fluid 56 (H) 3 - 23 U/L   Fluid Type-FLDH PLEURAL   Body fluid cell count with differential  Result Value Ref Range   Fluid Type-FCT PLEURAL    Color, Fluid STRAW (A) YELLOW   Appearance, Fluid HAZY (A) CLEAR   WBC, Fluid 99 0 - 1,000 cu mm   Neutrophil Count, Fluid 35 (H) 0 - 25 %   Lymphs, Fluid 46 %   Monocyte-Macrophage-Serous Fluid 19 (L) 50 - 90 %   Other Cells, Fluid RARE MESOTHELIAL CELLS PRESENT %  Albumin, pleural or peritoneal fluid  Result Value Ref Range   Albumin, Fluid <1.0 g/dL   Fluid Type-FALB PLEURAL   Protein, pleural or peritoneal fluid  Result Value Ref Range   Total protein, fluid <3.0 g/dL   Fluid Type-FTP PLEURAL   Glucose, pleural or peritoneal fluid  Result Value Ref Range   Glucose, Fluid 126 mg/dL   Fluid Type-FGLU PLEURAL   PH, Body Fluid  Result Value Ref Range   pH, Body Fluid 7.6 Not Estab.   Source of Sample PLEURAL   CBC  Result Value Ref Range   WBC 6.9 4.0 - 10.5 K/uL   RBC 3.60 (L) 3.87 - 5.11 MIL/uL   Hemoglobin 10.4 (L) 12.0 - 15.0 g/dL   HCT 34.8 (L) 36.0 - 46.0 %   MCV 96.7 80.0 - 100.0 fL   MCH 28.9 26.0 - 34.0 pg   MCHC 29.9 (L) 30.0 - 36.0 g/dL   RDW 17.2 (H) 11.5 - 15.5 %   Platelets 169 150 - 400 K/uL   nRBC 0.0 0.0 - 0.2 %  Basic metabolic panel  Result Value Ref Range   Sodium 141 135 - 145 mmol/L   Potassium 3.8 3.5 - 5.1 mmol/L   Chloride 93 (L) 98 - 111 mmol/L   CO2 38 (H) 22 - 32 mmol/L   Glucose, Bld 118 (H) 70 - 99 mg/dL   BUN 18 8 - 23 mg/dL   Creatinine, Ser 0.88 0.44 - 1.00 mg/dL   Calcium 8.5 (L) 8.9 - 10.3 mg/dL   GFR calc non Af Amer >60 >60 mL/min   GFR calc Af Amer >60 >60 mL/min   Anion gap 10 5 - 15  Heparin level (unfractionated)  Result  Value Ref Range   Heparin Unfractionated 0.36 0.30 - 0.70 IU/mL  Pathologist smear review  Result Value Ref Range   Path Review          CBC  Result Value Ref Range   WBC 6.4 4.0 - 10.5 K/uL   RBC 3.45 (L) 3.87 - 5.11 MIL/uL   Hemoglobin 9.8 (L) 12.0 - 15.0 g/dL   HCT 33.5 (L) 36.0 - 46.0 %   MCV 97.1 80.0 - 100.0 fL   MCH 28.4 26.0 - 34.0 pg   MCHC 29.3 (L) 30.0 - 36.0 g/dL   RDW 17.4 (H) 11.5 - 15.5 %   Platelets 156 150 - 400 K/uL   nRBC 0.0 0.0 - 0.2 %  Heparin level (unfractionated)  Result Value Ref Range   Heparin Unfractionated 0.47 0.30 - 0.70 IU/mL  Basic metabolic panel  Result Value Ref Range   Sodium 143 135 - 145 mmol/L   Potassium 4.1 3.5 - 5.1 mmol/L   Chloride 91 (L) 98 - 111 mmol/L   CO2 39 (H) 22 - 32 mmol/L   Glucose, Bld 112 (H) 70 - 99 mg/dL  BUN 16 8 - 23 mg/dL   Creatinine, Ser 0.82 0.44 - 1.00 mg/dL   Calcium 8.7 (L) 8.9 - 10.3 mg/dL   GFR calc non Af Amer >60 >60 mL/min   GFR calc Af Amer >60 >60 mL/min   Anion gap 13 5 - 15  CBC  Result Value Ref Range   WBC 7.1 4.0 - 10.5 K/uL   RBC 3.67 (L) 3.87 - 5.11 MIL/uL   Hemoglobin 10.5 (L) 12.0 - 15.0 g/dL   HCT 35.8 (L) 36.0 - 46.0 %   MCV 97.5 80.0 - 100.0 fL   MCH 28.6 26.0 - 34.0 pg   MCHC 29.3 (L) 30.0 - 36.0 g/dL   RDW 17.4 (H) 11.5 - 15.5 %   Platelets 166 150 - 400 K/uL   nRBC 0.0 0.0 - 0.2 %  Heparin level (unfractionated)  Result Value Ref Range   Heparin Unfractionated 0.31 0.30 - 0.70 IU/mL  Basic metabolic panel  Result Value Ref Range   Sodium 140 135 - 145 mmol/L   Potassium 5.2 (H) 3.5 - 5.1 mmol/L   Chloride 95 (L) 98 - 111 mmol/L   CO2 39 (H) 22 - 32 mmol/L   Glucose, Bld 123 (H) 70 - 99 mg/dL   BUN 13 8 - 23 mg/dL   Creatinine, Ser 0.91 0.44 - 1.00 mg/dL   Calcium 8.8 (L) 8.9 - 10.3 mg/dL   GFR calc non Af Amer >60 >60 mL/min   GFR calc Af Amer >60 >60 mL/min   Anion gap 6 5 - 15  Basic metabolic panel  Result Value Ref Range   Sodium 141 135 - 145 mmol/L    Potassium 5.4 (H) 3.5 - 5.1 mmol/L   Chloride 96 (L) 98 - 111 mmol/L   CO2 34 (H) 22 - 32 mmol/L   Glucose, Bld 109 (H) 70 - 99 mg/dL   BUN 10 8 - 23 mg/dL   Creatinine, Ser 1.00 0.44 - 1.00 mg/dL   Calcium 9.2 8.9 - 10.3 mg/dL   GFR calc non Af Amer 54 (L) >60 mL/min   GFR calc Af Amer >60 >60 mL/min   Anion gap 11 5 - 15  Basic metabolic panel  Result Value Ref Range   Sodium 142 135 - 145 mmol/L   Potassium 5.1 3.5 - 5.1 mmol/L   Chloride 99 98 - 111 mmol/L   CO2 35 (H) 22 - 32 mmol/L   Glucose, Bld 98 70 - 99 mg/dL   BUN 9 8 - 23 mg/dL   Creatinine, Ser 0.85 0.44 - 1.00 mg/dL   Calcium 8.8 (L) 8.9 - 10.3 mg/dL   GFR calc non Af Amer >60 >60 mL/min   GFR calc Af Amer >60 >60 mL/min   Anion gap 8 5 - 15  Magnesium  Result Value Ref Range   Magnesium 2.1 1.7 - 2.4 mg/dL  I-STAT 7, (LYTES, BLD GAS, ICA, H+H)  Result Value Ref Range   pH, Arterial 7.425 7.350 - 7.450   pCO2 arterial 68.2 (HH) 32.0 - 48.0 mmHg   pO2, Arterial 90.0 83.0 - 108.0 mmHg   Bicarbonate 44.7 (H) 20.0 - 28.0 mmol/L   TCO2 47 (H) 22 - 32 mmol/L   O2 Saturation 97.0 %   Acid-Base Excess 17.0 (H) 0.0 - 2.0 mmol/L   Sodium 138 135 - 145 mmol/L   Potassium 5.4 (H) 3.5 - 5.1 mmol/L   Calcium, Ion 1.19 1.15 - 1.40 mmol/L   HCT 33.0 (L)  36.0 - 46.0 %   Hemoglobin 11.2 (L) 12.0 - 15.0 g/dL   Patient temperature HIDE    Sample type ARTERIAL   POCT I-Stat EG7  Result Value Ref Range   pH, Ven 7.400 7.250 - 7.430   pCO2, Ven 66.8 (H) 44.0 - 60.0 mmHg   pO2, Ven 36.0 32.0 - 45.0 mmHg   Bicarbonate 41.4 (H) 20.0 - 28.0 mmol/L   TCO2 43 (H) 22 - 32 mmol/L   O2 Saturation 66.0 %   Acid-Base Excess 14.0 (H) 0.0 - 2.0 mmol/L   Sodium 139 135 - 145 mmol/L   Potassium 5.1 3.5 - 5.1 mmol/L   Calcium, Ion 1.22 1.15 - 1.40 mmol/L   HCT 33.0 (L) 36.0 - 46.0 %   Hemoglobin 11.2 (L) 12.0 - 15.0 g/dL   Patient temperature HIDE    Sample type VENOUS   ECHOCARDIOGRAM LIMITED  Result Value Ref Range   Weight 2,000  oz   Height 67 in   BP 111/74 mmHg       Pertinent labs & imaging results that were available during my care of the patient were reviewed by me and considered in my medical decision making.  Assessment & Plan:  Vessie was seen today for hospitalization follow-up.  Diagnoses and all orders for this visit:  Hospital discharge follow-up Doing well since discharge. Home health seeing pt. Will see cardiology in 2 weeks. Labs pending.  -     CMP14+EGFR -     CBC with Differential/Platelet -     Lipid panel -     TSH  Pleural effusion, right Will repeat chest xray in 4 weeks at return appointment. No shortness of breath or orthopnea. Diminished lung sounds in right lung base. No rhonchi or rales.   Hypokalemia Labs pending. No myalgias or palpitatoins. -     CMP14+EGFR  Iron deficiency anemia, unspecified iron deficiency anemia type Labs pending. Ongoing fatigue.  -     CBC with Differential/Platelet  Pure hypercholesterolemia -     Lipid panel  Other fatigue Sleep hygiene discussed. May try melatonin at night. Will check TSH. -     TSH     Continue all other maintenance medications.  Follow up plan: Return in about 4 weeks (around 05/14/2019), or if symptoms worsen or fail to improve, for recheck, CXR.  Educational handout given for insomnia  The above assessment and management plan was discussed with the patient. The patient verbalized understanding of and has agreed to the management plan. Patient is aware to call the clinic if symptoms persist or worsen. Patient is aware when to return to the clinic for a follow-up visit. Patient educated on when it is appropriate to go to the emergency department.   Monia Pouch, FNP-C [facility] [phone]

## 2019-04-17 LAB — CBC WITH DIFFERENTIAL/PLATELET
Basophils Absolute: 0.1 10*3/uL (ref 0.0–0.2)
Basos: 1 %
EOS (ABSOLUTE): 1.1 10*3/uL — ABNORMAL HIGH (ref 0.0–0.4)
Eos: 15 %
Hematocrit: 32.6 % — ABNORMAL LOW (ref 34.0–46.6)
Hemoglobin: 10.3 g/dL — ABNORMAL LOW (ref 11.1–15.9)
Immature Grans (Abs): 0 10*3/uL (ref 0.0–0.1)
Immature Granulocytes: 0 %
Lymphocytes Absolute: 1.5 10*3/uL (ref 0.7–3.1)
Lymphs: 21 %
MCH: 28.7 pg (ref 26.6–33.0)
MCHC: 31.6 g/dL (ref 31.5–35.7)
MCV: 91 fL (ref 79–97)
Monocytes Absolute: 0.6 10*3/uL (ref 0.1–0.9)
Monocytes: 9 %
Neutrophils Absolute: 4 10*3/uL (ref 1.4–7.0)
Neutrophils: 54 %
Platelets: 264 10*3/uL (ref 150–450)
RBC: 3.59 x10E6/uL — ABNORMAL LOW (ref 3.77–5.28)
RDW: 15.1 % (ref 11.7–15.4)
WBC: 7.3 10*3/uL (ref 3.4–10.8)

## 2019-04-17 LAB — CMP14+EGFR
ALT: 11 IU/L (ref 0–32)
AST: 18 IU/L (ref 0–40)
Albumin/Globulin Ratio: 2.2 (ref 1.2–2.2)
Albumin: 4.2 g/dL (ref 3.7–4.7)
Alkaline Phosphatase: 129 IU/L — ABNORMAL HIGH (ref 39–117)
BUN/Creatinine Ratio: 16 (ref 12–28)
BUN: 14 mg/dL (ref 8–27)
Bilirubin Total: 0.2 mg/dL (ref 0.0–1.2)
CO2: 29 mmol/L (ref 20–29)
Calcium: 8.8 mg/dL (ref 8.7–10.3)
Chloride: 101 mmol/L (ref 96–106)
Creatinine, Ser: 0.88 mg/dL (ref 0.57–1.00)
GFR calc Af Amer: 73 mL/min/{1.73_m2} (ref >59)
GFR calc non Af Amer: 63 mL/min/{1.73_m2} (ref >59)
Globulin, Total: 1.9 g/dL (ref 1.5–4.5)
Glucose: 105 mg/dL — ABNORMAL HIGH (ref 65–99)
Potassium: 3.3 mmol/L — ABNORMAL LOW (ref 3.5–5.2)
Sodium: 148 mmol/L — ABNORMAL HIGH (ref 134–144)
Total Protein: 6.1 g/dL (ref 6.0–8.5)

## 2019-04-17 LAB — TSH: TSH: 2.41 u[IU]/mL (ref 0.450–4.500)

## 2019-04-17 LAB — LIPID PANEL
Chol/HDL Ratio: 2.6 ratio (ref 0.0–4.4)
Cholesterol, Total: 149 mg/dL (ref 100–199)
HDL: 57 mg/dL (ref >39)
LDL Calculated: 69 mg/dL (ref 0–99)
Triglycerides: 113 mg/dL (ref 0–149)
VLDL Cholesterol Cal: 23 mg/dL (ref 5–40)

## 2019-04-19 MED ORDER — POTASSIUM CHLORIDE CRYS ER 20 MEQ PO TBCR: 20.0000 meq | EXTENDED_RELEASE_TABLET | Freq: Every day | ORAL | 1 refills | Status: DC

## 2019-04-19 NOTE — Addendum Note (Signed)
Addended by: Baruch Gouty on: 04/19/2019 08:31 AM   Modules accepted: Orders

## 2019-04-20 DIAGNOSIS — I4891 Unspecified atrial fibrillation: Secondary | ICD-10-CM | POA: Diagnosis not present

## 2019-04-20 DIAGNOSIS — J13 Pneumonia due to Streptococcus pneumoniae: Secondary | ICD-10-CM | POA: Diagnosis not present

## 2019-04-20 DIAGNOSIS — J918 Pleural effusion in other conditions classified elsewhere: Secondary | ICD-10-CM | POA: Diagnosis not present

## 2019-04-20 DIAGNOSIS — I4892 Unspecified atrial flutter: Secondary | ICD-10-CM | POA: Diagnosis not present

## 2019-04-20 DIAGNOSIS — I429 Cardiomyopathy, unspecified: Secondary | ICD-10-CM | POA: Diagnosis not present

## 2019-04-20 DIAGNOSIS — J9601 Acute respiratory failure with hypoxia: Secondary | ICD-10-CM | POA: Diagnosis not present

## 2019-04-26 ENCOUNTER — Telehealth: Payer: Self-pay | Admitting: *Deleted

## 2019-04-26 NOTE — Progress Notes (Signed)
Virtual Visit via Video Note   This visit type was conducted due to national recommendations for restrictions regarding the COVID-19 Pandemic (e.g. social distancing) in an effort to limit this patient's exposure and mitigate transmission in our community.  Due to her co-morbid illnesses, this patient is at least at moderate risk for complications without adequate follow up.  This format is felt to be most appropriate for this patient at this time.  All issues noted in this document were discussed and addressed.  A limited physical exam was performed with this format.  Please refer to the patient's chart for her consent to telehealth for [facility].   Date:  04/28/2019   ID:  Misty, [name] 02-16-1941, MRN [id number]  Patient Location: Home Provider Location: Home  PCP:  Chipper Herb, MD  Cardiologist:  Minus Breeding, MD  Electrophysiologist:  None   Evaluation Performed:  Follow-Up Visit  Chief Complaint:  Weakness  History of Present Illness:    Misty [name] is a 78 y.o. female who presents for follow up of atrial fib.  She was in the hospital last month with respiratory failure.  She had pneumonia and atrial fib with RVR.   She had an elevated troponin but had cath had mild non obstruct CAD.  She was found to have a reduced EF of 30 - 35%.  She had moderate MR.   The atrial fib and the cardiomyopathy was new.   Med titration was difficult secondary to hypotension.   She had a pleural effusion and had thoracentesis.    I saw her last month and she was very weak.    Since then she has been much better.  She continues to work with physical therapy and Occupational Therapy.  She does.  She is very done periodically.  She denies any chest pressure, neck or arm discomfort.  She is not as weak and lightheaded as she was.  She is not had any presyncope or syncope.  Her oxygen levels are staying excellent she got a pulse ox.  1 pressure is in the 110s 120s and she got a blood  pressure cuff.  She denies any cough fevers or chills.  She sleeps on the couch just because she is gotten used to it.  She is not having any PND or orthopnea.  She is had no swelling.  She said very little weight gain but she is eating better because her neighbors are bringing food.  The patient does not have symptoms concerning for COVID-19 infection (fever, chills, cough, or new shortness of breath).    Past Medical History:  Diagnosis Date  . Anxiety    takes Xanax daily  . Arthritis    back  . Atrial fibrillation with RVR ([place]) 03/25/2019  . Cataract   . Chronic back pain   . Constipation    OTC stool softener prn  . COPD (chronic obstructive pulmonary disease) ([place])   . Depression   . Diverticulosis   . GERD (gastroesophageal reflux disease)    takes Ranidine daily  . Hemorrhoids   . History of bronchitis   . History of migraine    many yrs ago  . History of shingles   . Hyperlipidemia   . Insomnia   . Migraines   . Osteoporosis   . PONV (postoperative nausea and vomiting)    Past Surgical History:  Procedure Laterality Date  . ABDOMINAL HYSTERECTOMY    . COLONOSCOPY    . COLONOSCOPY  N/A 02/17/2015   Procedure: COLONOSCOPY;  Surgeon: Rogene Houston, MD;  [facility];  Service: Endoscopy;  Laterality: N/A;  110n - moved to 11:15 - Ann to notify pt  . ESOPHAGOGASTRODUODENOSCOPY    . ESOPHAGOGASTRODUODENOSCOPY N/A 01/29/2016   Procedure: ESOPHAGOGASTRODUODENOSCOPY (EGD);  Surgeon: Rogene Houston, MD;  [facility];  Service: Endoscopy;  Laterality: N/A;  . ESOPHAGOGASTRODUODENOSCOPY N/A 10/14/2017   Procedure: ESOPHAGOGASTRODUODENOSCOPY (EGD);  Surgeon: Rogene Houston, MD;  [facility];  Service: Endoscopy;  Laterality: N/A;  730  . EYE SURGERY Right 3/15   cataracts  . INTRAMEDULLARY (IM) NAIL INTERTROCHANTERIC Left 01/07/2019   Procedure: INTRAMEDULLARY (IM) NAIL INTERTROCHANTRIC;  Surgeon: Carole Civil, MD;  Location: [facility];   Service: Orthopedics;  Laterality: Left;  . KYPHOPLASTY N/A 07/08/2014   Procedure: Thoracic Eleven Kyphoplasty;  Surgeon: Hosie Spangle, MD;  Location: [place] [facility];  Service: Neurosurgery;  Laterality: N/A;  . LUMBAR LAMINECTOMY/DECOMPRESSION MICRODISCECTOMY Bilateral 05/20/2013   Procedure: LUMBAR LAMINECTOMY/DECOMPRESSION MICRODISCECTOMY 1 LEVEL;  Surgeon: Hosie Spangle, MD;  Location: [place] [facility];  Service: Neurosurgery;  Laterality: Bilateral;  Bilateral Lumbar four-five laminotomy and left lumbar four-five microdiskectomy  . RIGHT/LEFT HEART CATH AND CORONARY ANGIOGRAPHY N/A 03/31/2019   Procedure: RIGHT/LEFT HEART CATH AND CORONARY ANGIOGRAPHY;  Surgeon: Sherren Mocha, MD;  Location: [facility];  Service: Cardiovascular;  Laterality: N/A;  . SPINE SURGERY     Dr Carloyn Manner -  vertebraplasty     Current Meds  Medication Sig  . acetaminophen (TYLENOL) 500 MG tablet Take 500 mg by mouth every 8 (eight) hours.   Marland Kitchen albuterol (PROVENTIL HFA;VENTOLIN HFA) 108 (90 Base) MCG/ACT inhaler Inhale 2 puffs into the lungs every 6 (six) hours as needed for wheezing or shortness of breath (cough).  . ALPRAZolam (XANAX) 0.5 MG tablet Take 0.5 mg by mouth at bedtime as needed for anxiety.  Marland Kitchen apixaban (ELIQUIS) 5 MG TABS tablet Take 1 tablet (5 mg total) by mouth 2 (two) times daily.  . bisoprolol (ZEBETA) 5 MG tablet Take 1 tablet (5 mg total) by mouth daily.  . cholecalciferol (VITAMIN D) 1000 UNITS tablet Take 2,000-4,000 Units by mouth daily. Take 2000 units daily except take 4000 units on Saturday and Sunday.  . escitalopram (LEXAPRO) 20 MG tablet Take 1 tablet (20 mg total) by mouth at bedtime.  . ferrous sulfate (FEOSOL) 325 (65 FE) MG tablet Take 1 tablet (325 mg total) by mouth 3 (three) times daily with meals.  . furosemide (LASIX) 20 MG tablet Take 1 tablet (20 mg total) by mouth daily.  . magic mouthwash w/lidocaine SOLN Take 5 mLs by mouth 3 (three) times daily as needed for mouth  pain.  . Multiple Vitamin (MULTIVITAMIN WITH MINERALS) TABS tablet Take 1 tablet by mouth daily. Centrum Silver  . pantoprazole (PROTONIX) 40 MG tablet Take 1 tablet (40 mg total) by mouth 2 (two) times daily before a meal.  . potassium chloride SA (K-DUR) 20 MEQ tablet Take 1 tablet (20 mEq total) by mouth daily for 30 days.  Marland Kitchen saccharomyces boulardii (FLORASTOR) 250 MG capsule Take 1 capsule (250 mg total) by mouth 2 (two) times daily.  . vitamin B-12 (CYANOCOBALAMIN) 1000 MCG tablet Take 1,000 mcg by mouth daily.     Allergies:   Codeine; Tramadol; Asa [aspirin]; Azithromycin; Celebrex [celecoxib]; Cymbalta [duloxetine hcl]; Prednisone; Vioxx [rofecoxib]; Zelnorm [tegaserod]; and Zocor [simvastatin]   Social History   Tobacco Use  . Smoking status:  Current Some Day Smoker    Packs/day: 1.00    Years: 52.00    Pack years: 52.00    Types: Cigarettes    Start date: 11/18/1958    Last attempt to quit: 08/20/2011    Years since quitting: 7.6  . Smokeless tobacco: Never Used  . Tobacco comment: 1/2-1pack off and on all her life  Substance Use Topics  . Alcohol use: No    Alcohol/week: 0.0 standard drinks  . Drug use: No     Family Hx: The patient's family history includes Cirrhosis in her son; Heart attack in her mother; Heart disease in her father and son; Hypertension in her mother; Macular degeneration in her father. There is no history of Colon cancer.  ROS:   Please see the history of present illness.    As stated in the HPI and negative for all other systems.   Prior CV studies:   The following studies were reviewed today:  None  Labs/Other Tests and Data Reviewed:    EKG:  No ECG reviewed.  Recent Labs: 03/25/2019: B Natriuretic Peptide >4,500.0 04/02/2019: Magnesium 2.1 04/16/2019: ALT 11; BUN 14; Creatinine, Ser 0.88; Hemoglobin 10.3; Platelets 264; Potassium 3.3; Sodium 148; TSH 2.410   Recent Lipid Panel Lab Results  Component Value Date/Time   CHOL 149  04/16/2019 01:32 PM   TRIG 113 04/16/2019 01:32 PM   TRIG 72 01/09/2015 09:48 AM   HDL 57 04/16/2019 01:32 PM   HDL 52 01/09/2015 09:48 AM   CHOLHDL 2.6 04/16/2019 01:32 PM   CHOLHDL 2.7 03/26/2019 02:24 AM   LDLCALC 69 04/16/2019 01:32 PM   LDLCALC 73 08/25/2014 09:43 AM    Wt Readings from Last 3 Encounters:  04/28/19 122 lb (55.3 kg)  04/16/19 119 lb 3.2 oz (54.1 kg)  04/07/19 125 lb (56.7 kg)     Objective:    Vital Signs:  BP 118/71   Pulse 71   Ht 5' 7"  (1.702 m)   Wt 122 lb (55.3 kg)   SpO2 96%   BMI 19.11 kg/m    VITAL SIGNS:  reviewed GEN:  no acute distress EYES:  sclerae anicteric, EOMI - Extraocular Movements Intact NEURO:  alert and oriented x 3, no obvious focal deficit PSYCH:  normal affect  ASSESSMENT & PLAN:    CARDIOMYOPATHY:   She is doing better.  Today  I will titrate her meds.  I will add Cozaar 12.5 mg daily.  Titrate very slowly.  Continue the meds as listed.   MR:  This was moderate.    I will follow this clinically after med titration with a repeat echo.   ATRIAL FIB:    Ms. MELENDA BIELAK has a CHA2DS2 - VASc score of 5.    She is tolerating coagulation.  She is had no symptomatic recurrence of her fibrillation.  No change in therapy.   MILDLY ELEVATED TROPONIN:   This was related to demand ischemia.  No change in therapy.  CAP:     Her oxygen levels have been excellent.  She does not need a home oxygen that was delivered.   PLEURAL EFFUSION:   I will follow-up with physical exams in the weeks ahead but symptomatically she is much improved.   Time:   Today, I have spent 20 minutes with the patient with telehealth technology discussing the above problems.     Medication Adjustments/Labs and Tests Ordered: Current medicines are reviewed at length with the patient today.  Concerns regarding medicines  are outlined above.   Tests Ordered: No orders of the defined types were placed in this encounter.   Medication Changes: No orders  of the defined types were placed in this encounter.   Disposition:  Follow up see me virtually in one month  Signed, Minus Breeding, MD  04/28/2019 9:40 AM    [place]

## 2019-04-26 NOTE — Telephone Encounter (Signed)
Virtual Visit Pre-Appointment Phone Call  "Misty [name], I am calling you today to discuss your upcoming appointment. We are currently trying to limit exposure to the virus that causes COVID-19 by seeing patients at home rather than in the office."  1. "What is the BEST phone number to call the day of the visit?" - include this in appointment notes  2. "Do you have or have access to (through a family member/friend) a smartphone with video capability that we can use for your visit?" a. If yes - list this number in appt notes as "cell" (if different from BEST phone #) and list the appointment type as a VIDEO visit in appointment notes b. If no - list the appointment type as a PHONE visit in appointment notes  Confirm consent - "In the setting of the current Covid19 crisis, you are scheduled for a (phone or video) visit with your provider on (date) at (time).  Just as we do with many in-office visits, in order for you to participate in this visit, we must obtain consent.  If you'd like, I can send this to your mychart (if signed up) or email for you to review.  Otherwise, I can obtain your verbal consent now.  All virtual visits are billed to your insurance company just like a normal visit would be.  By agreeing to a virtual visit, we'd like you to understand that the technology does not allow for your provider to perform an examination, and thus may limit your provider's ability to fully assess your condition. If your provider identifies any concerns that need to be evaluated in person, we will make arrangements to do so.  Finally, though the technology is pretty good, we cannot assure that it will always work on either your or our end, and in the setting of a video visit, we may have to convert it to a phone-only visit.  In either situation, we cannot ensure that we have a secure connection.  Are you willing to proceed?" STAFF: Did the patient verbally acknowledge consent to telehealth visit?  Document YES/NO here: Yes 3. Advise patient to be prepared - "Two hours prior to your appointment, go ahead and check your blood pressure, pulse, oxygen saturation, and your weight (if you have the equipment to check those) and write them all down. When your visit starts, your provider will ask you for this information. If you have an Apple Watch or Kardia device, please plan to have heart rate information ready on the day of your appointment. Please have a pen and paper handy nearby the day of the visit as well."  4. Give patient instructions for MyChart download to smartphone OR Doximity/Doxy.me as below if video visit (depending on what platform provider is using)  5. Inform patient they will receive a phone call 15 minutes prior to their appointment time (may be from unknown caller ID) so they should be prepared to answer    TELEPHONE CALL NOTE  Misty [name] has been deemed a candidate for a follow-up tele-health visit to limit community exposure during the Covid-19 pandemic. I spoke with the patient via phone to ensure availability of phone/video source, confirm preferred email & phone number, and discuss instructions and expectations.  I reminded Misty [name] to be prepared with any vital sign and/or heart rhythm information that could potentially be obtained via home monitoring, at the time of her visit. I reminded Misty [name] to expect a phone call prior to her  visit.  Dellie Piasecki 04/26/2019 10:57 AM   INSTRUCTIONS FOR DOWNLOADING THE MYCHART APP TO SMARTPHONE  - The patient must first make sure to have activated MyChart and know their login information - If Apple, go to [employer] Store and type in MyChart in the search bar and download the app. If Android, ask patient to go to [employer] Play Store and type in AdrianMyChart in the search bar and download the app. The app is free but as with any other app downloads, their phone may require them to verify saved payment information or  Apple/Android password.  - The patient will need to then log into the app with their MyChart username and password, and select [facility] as their healthcare provider to link the account. When it is time for your visit, go to the MyChart app, find appointments, and click Begin Video Visit. Be sure to Select Allow for your device to access the Microphone and Camera for your visit. You will then be connected, and your provider will be with you shortly.  **If they have any issues connecting, or need assistance please contact MyChart service desk [phone])**  **If using a computer, in order to ensure the best quality for their visit they will need to use either of the following Internet Browsers: [employer] Edge, or Google Chrome**  IF USING DOXIMITY or DOXY.ME - The patient will receive a link just prior to their visit by text.     FULL LENGTH CONSENT FOR TELE-HEALTH VISIT   I hereby voluntarily request, consent and authorize [facility] and its employed or contracted physicians, physician assistants, nurse practitioners or other licensed health care professionals (the Practitioner), to provide me with telemedicine health care services (the "Services") as deemed necessary by the treating Practitioner. I acknowledge and consent to receive the Services by the Practitioner via telemedicine. I understand that the telemedicine visit will involve communicating with the Practitioner through live audiovisual communication technology and the disclosure of certain medical information by electronic transmission. I acknowledge that I have been given the opportunity to request an in-person assessment or other available alternative prior to the telemedicine visit and am voluntarily participating in the telemedicine visit.  I understand that I have the right to withhold or withdraw my consent to the use of telemedicine in the course of my care at any time, without affecting my right to future care  or treatment, and that the Practitioner or I may terminate the telemedicine visit at any time. I understand that I have the right to inspect all information obtained and/or recorded in the course of the telemedicine visit and may receive copies of available information for a reasonable fee.  I understand that some of the potential risks of receiving the Services via telemedicine include:  Marland Kitchen. Delay or interruption in medical evaluation due to technological equipment failure or disruption; . Information transmitted may not be sufficient (e.g. poor resolution of images) to allow for appropriate medical decision making by the Practitioner; and/or  . In rare instances, security protocols could fail, causing a breach of personal health information.  Furthermore, I acknowledge that it is my responsibility to provide information about my medical history, conditions and care that is complete and accurate to the best of my ability. I acknowledge that Practitioner's advice, recommendations, and/or decision may be based on factors not within their control, such as incomplete or inaccurate data provided by me or distortions of diagnostic images or specimens that may result from electronic transmissions. I understand that  the practice of medicine is not an exact science and that Practitioner makes no warranties or guarantees regarding treatment outcomes. I acknowledge that I will receive a copy of this consent concurrently upon execution via email to the email address I last provided but may also request a printed copy by calling the office of [facility].    I understand that my insurance will be billed for this visit.   I have read or had this consent read to me. . I understand the contents of this consent, which adequately explains the benefits and risks of the Services being provided via telemedicine.  . I have been provided ample opportunity to ask questions regarding this consent and the Services and have had  my questions answered to my satisfaction. . I give my informed consent for the services to be provided through the use of telemedicine in my medical care  By participating in this telemedicine visit I agree to the above.

## 2019-04-27 DIAGNOSIS — I4892 Unspecified atrial flutter: Secondary | ICD-10-CM | POA: Diagnosis not present

## 2019-04-27 DIAGNOSIS — J13 Pneumonia due to Streptococcus pneumoniae: Secondary | ICD-10-CM | POA: Diagnosis not present

## 2019-04-27 DIAGNOSIS — J918 Pleural effusion in other conditions classified elsewhere: Secondary | ICD-10-CM | POA: Diagnosis not present

## 2019-04-27 DIAGNOSIS — J9601 Acute respiratory failure with hypoxia: Secondary | ICD-10-CM | POA: Diagnosis not present

## 2019-04-27 DIAGNOSIS — I4891 Unspecified atrial fibrillation: Secondary | ICD-10-CM | POA: Diagnosis not present

## 2019-04-27 DIAGNOSIS — I429 Cardiomyopathy, unspecified: Secondary | ICD-10-CM | POA: Diagnosis not present

## 2019-04-28 ENCOUNTER — Telehealth (INDEPENDENT_AMBULATORY_CARE_PROVIDER_SITE_OTHER): Payer: Medicare Other | Admitting: Cardiology

## 2019-04-28 ENCOUNTER — Encounter: Payer: Self-pay | Admitting: Cardiology

## 2019-04-28 ENCOUNTER — Other Ambulatory Visit: Payer: Self-pay

## 2019-04-28 VITALS — BP 118/71 | HR 71 | Ht 67.0 in | Wt 122.0 lb

## 2019-04-28 DIAGNOSIS — I4891 Unspecified atrial fibrillation: Secondary | ICD-10-CM

## 2019-04-28 DIAGNOSIS — I5021 Acute systolic (congestive) heart failure: Secondary | ICD-10-CM

## 2019-04-28 DIAGNOSIS — Z7189 Other specified counseling: Secondary | ICD-10-CM

## 2019-04-28 DIAGNOSIS — J90 Pleural effusion, not elsewhere classified: Secondary | ICD-10-CM

## 2019-04-28 MED ORDER — LOSARTAN POTASSIUM 25 MG PO TABS: 12.5000 mg | ORAL_TABLET | Freq: Every day | ORAL | 3 refills | Status: DC

## 2019-04-28 NOTE — Patient Instructions (Addendum)
Medication Instructions:  Please start Cozaar to 12.5 mg daily. Continue all other medications as listed.  If you need a refill on your cardiac medications before your next appointment, please call your pharmacy.   Follow-Up: Please follow up by virtual appointment with Dr Percival Spanish in 1 month.  Thank you for choosing [facility]!!

## 2019-04-29 DIAGNOSIS — I4892 Unspecified atrial flutter: Secondary | ICD-10-CM | POA: Diagnosis not present

## 2019-04-29 DIAGNOSIS — J918 Pleural effusion in other conditions classified elsewhere: Secondary | ICD-10-CM | POA: Diagnosis not present

## 2019-04-29 DIAGNOSIS — I4891 Unspecified atrial fibrillation: Secondary | ICD-10-CM | POA: Diagnosis not present

## 2019-04-29 DIAGNOSIS — J9601 Acute respiratory failure with hypoxia: Secondary | ICD-10-CM | POA: Diagnosis not present

## 2019-04-29 DIAGNOSIS — J13 Pneumonia due to Streptococcus pneumoniae: Secondary | ICD-10-CM | POA: Diagnosis not present

## 2019-04-29 DIAGNOSIS — I429 Cardiomyopathy, unspecified: Secondary | ICD-10-CM | POA: Diagnosis not present

## 2019-05-03 ENCOUNTER — Ambulatory Visit: Payer: Medicare Other | Admitting: Family Medicine

## 2019-05-03 DIAGNOSIS — J9601 Acute respiratory failure with hypoxia: Secondary | ICD-10-CM | POA: Diagnosis not present

## 2019-05-03 DIAGNOSIS — E440 Moderate protein-calorie malnutrition: Secondary | ICD-10-CM | POA: Diagnosis not present

## 2019-05-03 DIAGNOSIS — I5021 Acute systolic (congestive) heart failure: Secondary | ICD-10-CM | POA: Diagnosis not present

## 2019-05-03 DIAGNOSIS — I429 Cardiomyopathy, unspecified: Secondary | ICD-10-CM | POA: Diagnosis not present

## 2019-05-03 DIAGNOSIS — I4892 Unspecified atrial flutter: Secondary | ICD-10-CM | POA: Diagnosis not present

## 2019-05-03 DIAGNOSIS — I4891 Unspecified atrial fibrillation: Secondary | ICD-10-CM | POA: Diagnosis not present

## 2019-05-03 DIAGNOSIS — J13 Pneumonia due to Streptococcus pneumoniae: Secondary | ICD-10-CM | POA: Diagnosis not present

## 2019-05-03 DIAGNOSIS — F341 Dysthymic disorder: Secondary | ICD-10-CM | POA: Diagnosis not present

## 2019-05-03 DIAGNOSIS — J918 Pleural effusion in other conditions classified elsewhere: Secondary | ICD-10-CM | POA: Diagnosis not present

## 2019-05-03 DIAGNOSIS — D509 Iron deficiency anemia, unspecified: Secondary | ICD-10-CM | POA: Diagnosis not present

## 2019-05-03 DIAGNOSIS — Z9981 Dependence on supplemental oxygen: Secondary | ICD-10-CM | POA: Diagnosis not present

## 2019-05-05 ENCOUNTER — Telehealth: Payer: Self-pay | Admitting: Family Medicine

## 2019-05-05 DIAGNOSIS — I4891 Unspecified atrial fibrillation: Secondary | ICD-10-CM | POA: Diagnosis not present

## 2019-05-05 DIAGNOSIS — J9601 Acute respiratory failure with hypoxia: Secondary | ICD-10-CM | POA: Diagnosis not present

## 2019-05-05 DIAGNOSIS — J918 Pleural effusion in other conditions classified elsewhere: Secondary | ICD-10-CM | POA: Diagnosis not present

## 2019-05-05 DIAGNOSIS — I4892 Unspecified atrial flutter: Secondary | ICD-10-CM | POA: Diagnosis not present

## 2019-05-05 DIAGNOSIS — J13 Pneumonia due to Streptococcus pneumoniae: Secondary | ICD-10-CM | POA: Diagnosis not present

## 2019-05-05 DIAGNOSIS — I429 Cardiomyopathy, unspecified: Secondary | ICD-10-CM | POA: Diagnosis not present

## 2019-05-05 NOTE — Telephone Encounter (Signed)
PT was told by dr Laurance Flatten to call Roselyn Reef and talk to her

## 2019-05-05 NOTE — Telephone Encounter (Signed)
Called pt.

## 2019-05-12 DIAGNOSIS — I4891 Unspecified atrial fibrillation: Secondary | ICD-10-CM | POA: Diagnosis not present

## 2019-05-12 DIAGNOSIS — I4892 Unspecified atrial flutter: Secondary | ICD-10-CM | POA: Diagnosis not present

## 2019-05-12 DIAGNOSIS — J13 Pneumonia due to Streptococcus pneumoniae: Secondary | ICD-10-CM | POA: Diagnosis not present

## 2019-05-12 DIAGNOSIS — J918 Pleural effusion in other conditions classified elsewhere: Secondary | ICD-10-CM | POA: Diagnosis not present

## 2019-05-12 DIAGNOSIS — I429 Cardiomyopathy, unspecified: Secondary | ICD-10-CM | POA: Diagnosis not present

## 2019-05-12 DIAGNOSIS — J9601 Acute respiratory failure with hypoxia: Secondary | ICD-10-CM | POA: Diagnosis not present

## 2019-05-13 ENCOUNTER — Telehealth: Payer: Medicare Other

## 2019-05-18 ENCOUNTER — Ambulatory Visit: Payer: Medicare Other | Admitting: *Deleted

## 2019-05-18 DIAGNOSIS — J8410 Pulmonary fibrosis, unspecified: Secondary | ICD-10-CM

## 2019-05-18 DIAGNOSIS — G8929 Other chronic pain: Secondary | ICD-10-CM

## 2019-05-18 DIAGNOSIS — R531 Weakness: Secondary | ICD-10-CM

## 2019-05-18 DIAGNOSIS — M545 Low back pain, unspecified: Secondary | ICD-10-CM

## 2019-05-18 NOTE — Chronic Care Management (AMB) (Signed)
  Chronic Care Management   Outreach Note  05/18/2019 Name: Misty [name] MRN: [id number] DOB: 17-Jul-1941  Referred by: Baruch Gouty, FNP Reason for referral : Chronic Care Management (RN Outreach)  An unsuccessful telephone outreach was attempted today. The patient was referred to the case management team by for assistance with chronic care management and care coordination.    I spoke with patient/caregiver on 04/14/19 after her hospital discharge. She was receiving home health services and declined CCM at that time but was open to considering it. The plan was to follow up after completion of [facility] services.   Follow Up Plan: The care management team will reach out to the patient again over the next 30 days.    Chong Sicilian, RN-BC, BSN Nurse Care Manager [facility] [phone]

## 2019-05-19 ENCOUNTER — Telehealth: Payer: Self-pay | Admitting: Family Medicine

## 2019-05-19 DIAGNOSIS — J13 Pneumonia due to Streptococcus pneumoniae: Secondary | ICD-10-CM | POA: Diagnosis not present

## 2019-05-19 DIAGNOSIS — J9601 Acute respiratory failure with hypoxia: Secondary | ICD-10-CM | POA: Diagnosis not present

## 2019-05-19 DIAGNOSIS — I4891 Unspecified atrial fibrillation: Secondary | ICD-10-CM | POA: Diagnosis not present

## 2019-05-19 DIAGNOSIS — I4892 Unspecified atrial flutter: Secondary | ICD-10-CM | POA: Diagnosis not present

## 2019-05-19 DIAGNOSIS — J918 Pleural effusion in other conditions classified elsewhere: Secondary | ICD-10-CM | POA: Diagnosis not present

## 2019-05-19 DIAGNOSIS — I429 Cardiomyopathy, unspecified: Secondary | ICD-10-CM | POA: Diagnosis not present

## 2019-05-20 NOTE — Telephone Encounter (Signed)
We will wait to see if she requires this in the near future. If she continues to do well without the oxygen, we can write the letter.

## 2019-05-21 ENCOUNTER — Other Ambulatory Visit (INDEPENDENT_AMBULATORY_CARE_PROVIDER_SITE_OTHER): Payer: Self-pay | Admitting: Internal Medicine

## 2019-05-21 DIAGNOSIS — K2100 Gastro-esophageal reflux disease with esophagitis, without bleeding: Secondary | ICD-10-CM

## 2019-05-25 DIAGNOSIS — I4891 Unspecified atrial fibrillation: Secondary | ICD-10-CM | POA: Diagnosis not present

## 2019-05-25 DIAGNOSIS — J13 Pneumonia due to Streptococcus pneumoniae: Secondary | ICD-10-CM | POA: Diagnosis not present

## 2019-05-25 DIAGNOSIS — J9601 Acute respiratory failure with hypoxia: Secondary | ICD-10-CM | POA: Diagnosis not present

## 2019-05-25 DIAGNOSIS — J918 Pleural effusion in other conditions classified elsewhere: Secondary | ICD-10-CM | POA: Diagnosis not present

## 2019-05-25 DIAGNOSIS — I4892 Unspecified atrial flutter: Secondary | ICD-10-CM | POA: Diagnosis not present

## 2019-05-25 DIAGNOSIS — I429 Cardiomyopathy, unspecified: Secondary | ICD-10-CM | POA: Diagnosis not present

## 2019-05-25 NOTE — Telephone Encounter (Signed)
Attempted to contact patient - no call back. Waiting to see it patient needs in the future and if not provider will provide letter. This encounter will be closed.

## 2019-06-01 DIAGNOSIS — I429 Cardiomyopathy, unspecified: Secondary | ICD-10-CM | POA: Diagnosis not present

## 2019-06-01 DIAGNOSIS — J9601 Acute respiratory failure with hypoxia: Secondary | ICD-10-CM | POA: Diagnosis not present

## 2019-06-01 DIAGNOSIS — J918 Pleural effusion in other conditions classified elsewhere: Secondary | ICD-10-CM | POA: Diagnosis not present

## 2019-06-01 DIAGNOSIS — I4891 Unspecified atrial fibrillation: Secondary | ICD-10-CM | POA: Diagnosis not present

## 2019-06-01 DIAGNOSIS — I4892 Unspecified atrial flutter: Secondary | ICD-10-CM | POA: Diagnosis not present

## 2019-06-01 DIAGNOSIS — J13 Pneumonia due to Streptococcus pneumoniae: Secondary | ICD-10-CM | POA: Diagnosis not present

## 2019-06-02 ENCOUNTER — Telehealth: Payer: Medicare Other | Admitting: Cardiology

## 2019-06-11 ENCOUNTER — Telehealth: Payer: Self-pay | Admitting: Family Medicine

## 2019-06-11 NOTE — Telephone Encounter (Signed)
refaxed discontinued Rx to [facility] [facility] today

## 2019-06-16 ENCOUNTER — Encounter: Payer: Medicare Other | Admitting: *Deleted

## 2019-06-22 ENCOUNTER — Other Ambulatory Visit (INDEPENDENT_AMBULATORY_CARE_PROVIDER_SITE_OTHER): Payer: Self-pay | Admitting: Internal Medicine

## 2019-07-09 ENCOUNTER — Ambulatory Visit (INDEPENDENT_AMBULATORY_CARE_PROVIDER_SITE_OTHER): Payer: Medicare Other | Admitting: Family Medicine

## 2019-07-09 ENCOUNTER — Encounter: Payer: Self-pay | Admitting: Family Medicine

## 2019-07-09 ENCOUNTER — Other Ambulatory Visit: Payer: Self-pay

## 2019-07-09 VITALS — BP 102/54 | HR 127 | Temp 98.0°F | Ht 67.0 in | Wt 122.0 lb

## 2019-07-09 DIAGNOSIS — Z79899 Other long term (current) drug therapy: Secondary | ICD-10-CM | POA: Diagnosis not present

## 2019-07-09 DIAGNOSIS — E559 Vitamin D deficiency, unspecified: Secondary | ICD-10-CM

## 2019-07-09 DIAGNOSIS — F339 Major depressive disorder, recurrent, unspecified: Secondary | ICD-10-CM

## 2019-07-09 DIAGNOSIS — E7800 Pure hypercholesterolemia, unspecified: Secondary | ICD-10-CM

## 2019-07-09 DIAGNOSIS — K219 Gastro-esophageal reflux disease without esophagitis: Secondary | ICD-10-CM | POA: Diagnosis not present

## 2019-07-09 DIAGNOSIS — I700 Atherosclerosis of aorta: Secondary | ICD-10-CM

## 2019-07-09 DIAGNOSIS — F411 Generalized anxiety disorder: Secondary | ICD-10-CM | POA: Diagnosis not present

## 2019-07-09 DIAGNOSIS — R748 Abnormal levels of other serum enzymes: Secondary | ICD-10-CM

## 2019-07-09 MED ORDER — ALPRAZOLAM 0.5 MG PO TABS: 0.5000 mg | ORAL_TABLET | Freq: Two times a day (BID) | ORAL | 3 refills | Status: AC | PRN

## 2019-07-09 NOTE — Patient Instructions (Signed)
It was a pleasure seeing you today, Donabelle.  Information regarding what we discussed is included in this packet.  Please make an appointment to see me in 3 months.   In a few days you may receive a survey in the mail or online from [employer] regarding your visit with [place] today. Please take a moment to fill this out. Your feedback is very important to our office. It can help [place] better understand your needs as well as improve your experience and satisfaction. Thank you for taking your time to complete it. We care about you.  Because of recent events of COVID-19 ("Coronavirus"), please follow CDC recommendations:   1. Wash your hand frequently 2. Avoid touching your face 3. Stay away from people who are sick 4. If you have symptoms such as fever, cough, shortness of breath then call your healthcare provider for further guidance 5. If you are sick, STAY AT HOME, unless otherwise directed by your healthcare provider. 6. Follow directions from state and national officials regarding staying safe    Please feel free to call our office if any questions or concerns arise.  Warm Regards, Monia Pouch, FNP-C [facility] [address] [place], [place] [address] [phone]

## 2019-07-09 NOTE — Progress Notes (Addendum)
Subjective:  Patient ID: Misty [name], female    DOB: [birth year], 78 y.o.   MRN: [id number]  Patient Care Team: Baruch Gouty, FNP as PCP - General (Family Medicine) Minus Breeding, MD as PCP - Cardiology (Cardiology) Rogene Houston, MD as Consulting Physician (Gastroenterology) Glenna Fellows, MD as Attending Physician (Neurosurgery) Alanda Slim, Neena Rhymes, MD as Consulting Physician (Ophthalmology) Ilean China, RN as Registered Nurse   Chief Complaint:  Medical Management of Chronic Issues and Hyperlipidemia   HPI: Misty [name] is a 78 y.o. female presenting on 07/09/2019 for Medical Management of Chronic Issues and Hyperlipidemia   Pt presents today for management of chronic medical conditions and a refill on her Xanax. Pt has been recovering form her recent hospital admission in May. Pt was admitted on 03/25/2019 for A-Fib with RVR, right pleural effusion, CAP, hypoxia, acute systolic heart failure, and iron deficiency anemia. She was discharged home on 04/02/2019. She was last seen in office on 04/16/2019. She states she continues to feel weak but is slowly gaining her strength back. States she has felt more fatigue over the last few days. States she just wants to get her strength back. She has been taking medications as prescribed and without associated side effects. She states she needs a refill on her xanax today. States she continues to take this three times daily although it is noted this it to be taken only at night as needed. This change was made during recent hospital admission. Pt states she has never only taken this at night and has to have it three times per day.  She denies weight changes, chest pain, palpitations, dizziness, shortness of breath, or syncope. She does have generalized weakness and fatigue. No hemoptysis, hematochezia, or melena.   Hyperlipidemia This is a chronic problem. The problem is controlled. Pertinent negatives include no chest pain, focal  sensory loss, focal weakness, leg pain, myalgias or shortness of breath. She is currently on no antihyperlipidemic treatment. Risk factors for coronary artery disease include dyslipidemia and a sedentary lifestyle.  Anemia Presents for follow-up visit. Symptoms include malaise/fatigue and pallor. There has been no abdominal pain, anorexia, bruising/bleeding easily, confusion, fever, leg swelling, light-headedness, palpitations, paresthesias, pica or weight loss. Signs of blood loss that are not present include hematemesis, hematochezia, melena and vaginal bleeding. Procedure history includes EGD. Compliance with medications is 51-75%.  Anxiety Presents for follow-up visit. Symptoms include depressed mood, excessive worry, malaise, nausea, nervous/anxious behavior and restlessness. Patient reports no chest pain, confusion, decreased concentration, dizziness, palpitations, shortness of breath or suicidal ideas. Symptoms occur constantly. The severity of symptoms is moderate. The quality of sleep is fair. Nighttime awakenings: occasional.   Her past medical history is significant for anemia and depression. Compliance with medications is 76-100%.  Depression      The patient presents with depression.  This is a chronic problem.  The current episode started more than 1 year ago.   The problem has been waxing and waning since onset.  Associated symptoms include fatigue, restlessness, decreased interest, appetite change and body aches.  Associated symptoms include no decreased concentration, no myalgias, no headaches, no indigestion, not sad and no suicidal ideas.  Past treatments include SSRIs - Selective serotonin reuptake inhibitors and other medications.  Compliance with treatment is good.  Previous treatment provided moderate relief.  Past medical history includes chronic illness, recent illness, anxiety and depression.   Gastroesophageal Reflux She complains of nausea. She reports no abdominal pain, no  belching, no chest pain, no choking, no coughing, no dysphagia, no early satiety, no globus sensation, no heartburn, no hoarse voice, no sore throat, no stridor, no tooth decay, no water brash or no wheezing. This is a chronic problem. The problem has been waxing and waning. The symptoms are aggravated by certain foods. Associated symptoms include anemia and fatigue. Pertinent negatives include no melena, muscle weakness, orthopnea or weight loss. Risk factors include hiatal hernia and smoking/tobacco exposure. She has tried a PPI for the symptoms. The treatment provided moderate relief. Past procedures include an EGD.      Relevant past medical, surgical, family, and social history reviewed and updated as indicated.  Allergies and medications reviewed and updated. Date reviewed: Chart in Epic.   Past Medical History:  Diagnosis Date   Anxiety    takes Xanax daily   Arthritis    back   Atrial fibrillation with RVR (HCC) 03/25/2019   Cataract    Chronic back pain    Constipation    OTC stool softener prn   COPD (chronic obstructive pulmonary disease) (HCC)    Depression    Diverticulosis    GERD (gastroesophageal reflux disease)    takes Ranidine daily   Hemorrhoids    History of bronchitis    History of migraine    many yrs ago   History of shingles    Hyperlipidemia    Insomnia    Migraines    Osteoporosis    PONV (postoperative nausea and vomiting)     Past Surgical History:  Procedure Laterality Date   ABDOMINAL HYSTERECTOMY     COLONOSCOPY     COLONOSCOPY N/A 02/17/2015   Procedure: COLONOSCOPY;  Surgeon: Rogene Houston, MD;  Location: [facility];  Service: Endoscopy;  Laterality: N/A;  110n - moved to 11:15 - Ann to notify pt   ESOPHAGOGASTRODUODENOSCOPY     ESOPHAGOGASTRODUODENOSCOPY N/A 01/29/2016   Procedure: ESOPHAGOGASTRODUODENOSCOPY (EGD);  Surgeon: Rogene Houston, MD;  Location: [facility];  Service: Endoscopy;  Laterality: N/A;    ESOPHAGOGASTRODUODENOSCOPY N/A 10/14/2017   Procedure: ESOPHAGOGASTRODUODENOSCOPY (EGD);  Surgeon: Rogene Houston, MD;  Location: [facility];  Service: Endoscopy;  Laterality: N/A;  730   EYE SURGERY Right 3/15   cataracts   INTRAMEDULLARY (IM) NAIL INTERTROCHANTERIC Left 01/07/2019   Procedure: INTRAMEDULLARY (IM) NAIL INTERTROCHANTRIC;  Surgeon: Carole Civil, MD;  Location: [facility];  Service: Orthopedics;  Laterality: Left;   KYPHOPLASTY N/A 07/08/2014   Procedure: Thoracic Eleven Kyphoplasty;  Surgeon: Hosie Spangle, MD;  Location: [facility];  Service: Neurosurgery;  Laterality: N/A;   left hip surgery     LUMBAR LAMINECTOMY/DECOMPRESSION MICRODISCECTOMY Bilateral 05/20/2013   Procedure: LUMBAR LAMINECTOMY/DECOMPRESSION MICRODISCECTOMY 1 LEVEL;  Surgeon: Hosie Spangle, MD;  Location: [place] [facility];  Service: Neurosurgery;  Laterality: Bilateral;  Bilateral Lumbar four-five laminotomy and left lumbar four-five microdiskectomy   RIGHT/LEFT HEART CATH AND CORONARY ANGIOGRAPHY N/A 03/31/2019   Procedure: RIGHT/LEFT HEART CATH AND CORONARY ANGIOGRAPHY;  Surgeon: Sherren Mocha, MD;  Location: [place] [facility];  Service: Cardiovascular;  Laterality: N/A;   SPINE SURGERY     Dr Carloyn Manner -  vertebraplasty    Social History   Socioeconomic History   Marital status: Married    Spouse name: Sterling Big    Number of children: Not on file   Years of education: Not on file   Highest education level: Not on file  Occupational History   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 [employer] strain: Not on file   Food insecurity    Worry: Not on file    Inability: Not on Lexicographer needs    Medical: Not on file    Non-medical: Not on file  Tobacco Use   Smoking status: Current Some Day Smoker    Packs/day: 1.00    Years: 52.00    Pack years: 52.00    Types: Cigarettes    Start date: 11/18/1958    Last attempt to  quit: 08/20/2011    Years since quitting: 7.8   Smokeless tobacco: Never Used   Tobacco comment: 1/2-1pack off and on all her life  Substance and Sexual Activity   Alcohol use: No    Alcohol/week: 0.0 standard drinks   Drug use: No   Sexual activity: Not Currently  Lifestyle   Physical activity    Days per week: Not on file    Minutes per session: Not on file   Stress: Not on file  Relationships   Social connections    Talks on phone: Not on file    Gets together: Not on file    Attends religious service: Not on file    Active member of club or organization: Not on file    Attends meetings of clubs or organizations: Not on file    Relationship status: Not on file   Intimate partner violence    Fear of current or ex partner: Not on file    Emotionally abused: Not on file    Physically abused: Not on file    Forced sexual activity: Not on file  Other Topics Concern   Not on file  Social History Narrative   Lives at home with husband, Sterling Big.   Had one son- now deceased     Outpatient Encounter Medications as of 07/09/2019  Medication Sig   acetaminophen (TYLENOL) 500 MG tablet Take 500 mg by mouth every 8 (eight) hours.    albuterol (PROVENTIL HFA;VENTOLIN HFA) 108 (90 Base) MCG/ACT inhaler Inhale 2 puffs into the lungs every 6 (six) hours as needed for wheezing or shortness of breath (cough).   [START ON 07/24/2019] ALPRAZolam (XANAX) 0.5 MG tablet Take 1 tablet (0.5 mg total) by mouth 2 (two) times daily as needed for anxiety.   apixaban (ELIQUIS) 5 MG TABS tablet Take 1 tablet (5 mg total) by mouth 2 (two) times daily.   bisoprolol (ZEBETA) 5 MG tablet Take 1 tablet (5 mg total) by mouth daily.   cholecalciferol (VITAMIN D) 1000 UNITS tablet Take 2,000-4,000 Units by mouth daily. Take 2000 units daily except take 4000 units on Saturday and Sunday.   escitalopram (LEXAPRO) 20 MG tablet TAKE ONE TABLET AT BEDTIME   ferrous sulfate (FEOSOL) 325 (65 FE) MG tablet  Take 1 tablet (325 mg total) by mouth 3 (three) times daily with meals.   furosemide (LASIX) 20 MG tablet Take 1 tablet (20 mg total) by mouth daily.   losartan (COZAAR) 25 MG tablet Take 0.5 tablets (12.5 mg total) by mouth daily.   Multiple Vitamin (MULTIVITAMIN WITH MINERALS) TABS tablet Take 1 tablet by mouth daily. Centrum Silver   pantoprazole (PROTONIX) 40 MG tablet Take 1 tablet (40 mg total) by mouth 2 (two) timesdaily before a meal.   potassium chloride SA (K-DUR) 20 MEQ tablet Take 1 tablet (20 mEq total) by mouth daily for 30 days.   vitamin B-12 (CYANOCOBALAMIN)  1000 MCG tablet Take 1,000 mcg by mouth daily.   [DISCONTINUED] ALPRAZolam (XANAX) 0.5 MG tablet Take 0.5 mg by mouth at bedtime as needed for anxiety.   magic mouthwash w/lidocaine SOLN Take 5 mLs by mouth 3 (three) times daily as needed for mouth pain. (Patient not taking: Reported on 07/09/2019)   [DISCONTINUED] saccharomyces boulardii (FLORASTOR) 250 MG capsule Take 1 capsule (250 mg total) by mouth 2 (two) times daily.   No facility-administered encounter medications on file as of 07/09/2019.     Allergies  Allergen Reactions   Codeine Nausea And Vomiting   Tramadol Nausea And Vomiting   Asa [Aspirin] Other (See Comments)    Patient is unaware of allergy   Azithromycin Other (See Comments)    Patient is unaware of allergy   Celebrex [Celecoxib] Other (See Comments)    Irritated stomach    Cymbalta [Duloxetine Hcl] Other (See Comments)    Felt groggy   Prednisone Rash    She has seen the podiatrist and he had injected cortisone in her feet and she had also taken a peel at home. She had a severe reaction to her face with a rash and had to take Benadryl to resolve the symptom. She actually did get more cortisone shots, but no additional reactions to the shots.   Vioxx [Rofecoxib] Other (See Comments)    Unknown   Zelnorm [Tegaserod] Other (See Comments)    Patient is unaware of allergy   Zocor  [Simvastatin] Other (See Comments)    Patient is unaware of allergy    Review of Systems  Constitutional: Positive for activity change, appetite change, chills, fatigue and malaise/fatigue. Negative for diaphoresis, fever, unexpected weight change and weight loss.  HENT: Negative for hoarse voice and sore throat.   Eyes: Negative for photophobia and visual disturbance.  Respiratory: Negative for cough, choking, chest tightness, shortness of breath and wheezing.   Cardiovascular: Negative for chest pain and palpitations.  Gastrointestinal: Positive for nausea. Negative for abdominal distention, abdominal pain, anal bleeding, anorexia, blood in stool, constipation, diarrhea, dysphagia, heartburn, hematemesis, hematochezia, melena, rectal pain and vomiting.  Genitourinary: Negative for decreased urine volume, difficulty urinating, hematuria and vaginal bleeding.  Musculoskeletal: Positive for arthralgias and back pain. Negative for gait problem, joint swelling, myalgias, muscle weakness, neck pain and neck stiffness.  Skin: Positive for color change and pallor. Negative for rash and wound.  Neurological: Positive for weakness (generalized). Negative for dizziness, tremors, focal weakness, seizures, syncope, facial asymmetry, speech difficulty, light-headedness, numbness, headaches and paresthesias.  Hematological: Does not bruise/bleed easily.  Psychiatric/Behavioral: Positive for behavioral problems, depression, dysphoric mood and sleep disturbance. Negative for agitation, confusion, decreased concentration, hallucinations, self-injury and suicidal ideas. The patient is nervous/anxious. The patient is not hyperactive.   All other systems reviewed and are negative.       Objective:  BP (!) 102/54 (BP Location: Left Arm, Cuff Size: Normal)    Pulse (!) 127    Temp 98 F (36.7 C)    Ht 5' 7"  (1.702 m)    Wt 122 lb (55.3 kg)    BMI 19.11 kg/m    Wt Readings from Last 3 Encounters:  07/09/19 122  lb (55.3 kg)  04/28/19 122 lb (55.3 kg)  04/16/19 119 lb 3.2 oz (54.1 kg)    Physical Exam Vitals signs and nursing note reviewed.  Constitutional:      General: She is not in acute distress.    Appearance: She is normal weight. She is ill-appearing.  She is not toxic-appearing or diaphoretic.  HENT:     [name] Normocephalic and atraumatic.     Right Ear: Hearing normal.     Left Ear: Hearing normal.     Nose: Nose normal.     Mouth/Throat:     Lips: Pink.     Mouth: Mucous membranes are moist. Mucous membranes are pale.  Eyes:     General: Lids are normal.     Pupils: Pupils are equal, round, and reactive to light.     Right eye: Pupil is sluggish.     Left eye: Pupil is sluggish.     Comments: Pupils pinpoint, pt denies opioid pain medication use, pupils reactive but sluggish  Neck:     Musculoskeletal: Normal range of motion and neck supple.     Thyroid: No thyroid mass, thyromegaly or thyroid tenderness.     Vascular: No carotid bruit or JVD.     Trachea: Trachea and phonation normal.  Cardiovascular:     Rate and Rhythm: Normal rate. Rhythm regularly irregular.     Chest Wall: PMI is not displaced.     Pulses: Normal pulses.     Heart sounds: Normal heart sounds. No murmur. No friction rub. No gallop.   Pulmonary:     Effort: Pulmonary effort is normal. No respiratory distress.     Breath sounds: Examination of the right-lower field reveals wheezing and rhonchi. Examination of the left-lower field reveals wheezing and rhonchi. Wheezing and rhonchi present. No decreased breath sounds.  Abdominal:     General: There is no distension.     Palpations: Abdomen is soft. There is no hepatomegaly or splenomegaly.     Tenderness: There is no abdominal tenderness. There is no right CVA tenderness or left CVA tenderness.  Musculoskeletal: Normal range of motion.        General: No deformity or signs of injury.     Right lower leg: 1+ Edema present.     Left lower leg: 1+ Edema  present.  Lymphadenopathy:     Cervical: No cervical adenopathy.  Skin:    General: Skin is warm.     Capillary Refill: Capillary refill takes less than 2 seconds.     Coloration: Skin is pale.     Comments: Pale in color with generalized weakness  Neurological:     General: No focal deficit present.     Mental Status: She is alert and oriented to person, place, and time.  Psychiatric:        Attention and Perception: Attention normal.        Mood and Affect: Mood normal.        Speech: Speech normal.        Behavior: Behavior normal. Behavior is cooperative.        Thought Content: Thought content normal.        Judgment: Judgment normal.     Results for orders placed or performed in visit on 07/09/19  CMP14+EGFR  Result Value Ref Range   Glucose 128 (H) 65 - 99 mg/dL   BUN 11 8 - 27 mg/dL   Creatinine, Ser 0.87 0.57 - 1.00 mg/dL   GFR calc non Af Amer 64 >59 mL/min/1.73   GFR calc Af Amer 74 >59 mL/min/1.73   BUN/Creatinine Ratio 13 12 - 28   Sodium 143 134 - 144 mmol/L   Potassium 4.2 3.5 - 5.2 mmol/L   Chloride 101 96 - 106 mmol/L   CO2 26 20 - 29  mmol/L   Calcium 8.7 8.7 - 10.3 mg/dL   Total Protein 5.8 (L) 6.0 - 8.5 g/dL   Albumin 3.6 (L) 3.7 - 4.7 g/dL   Globulin, Total 2.2 1.5 - 4.5 g/dL   Albumin/Globulin Ratio 1.6 1.2 - 2.2   Bilirubin Total <0.2 0.0 - 1.2 mg/dL   Alkaline Phosphatase 129 (H) 39 - 117 IU/L   AST 15 0 - 40 IU/L   ALT 9 0 - 32 IU/L  CBC with Differential/Platelet  Result Value Ref Range   WBC 7.2 3.4 - 10.8 x10E3/uL   RBC 2.46 (LL) 3.77 - 5.28 x10E6/uL   Hemoglobin 6.5 (LL) 11.1 - 15.9 g/dL   Hematocrit 22.9 (L) 34.0 - 46.6 %   MCV 93 79 - 97 fL   MCH 26.4 (L) 26.6 - 33.0 pg   MCHC 28.4 (L) 31.5 - 35.7 g/dL   RDW 13.6 11.7 - 15.4 %   Platelets 264 150 - 450 x10E3/uL   Neutrophils 73 Not Estab. %   Lymphs 12 Not Estab. %   Monocytes 7 Not Estab. %   Eos 7 Not Estab. %   Basos 1 Not Estab. %   Neutrophils Absolute 5.3 1.4 - 7.0  x10E3/uL   Lymphocytes Absolute 0.8 0.7 - 3.1 x10E3/uL   Monocytes Absolute 0.5 0.1 - 0.9 x10E3/uL   EOS (ABSOLUTE) 0.5 (H) 0.0 - 0.4 x10E3/uL   Basophils Absolute 0.1 0.0 - 0.2 x10E3/uL   Immature Granulocytes 0 Not Estab. %   Immature Grans (Abs) 0.0 0.0 - 0.1 x10E3/uL  Gamma GT  Result Value Ref Range   GGT 13 0 - 60 IU/L  Thyroid Panel With TSH  Result Value Ref Range   TSH 2.340 0.450 - 4.500 uIU/mL   T4, Total 6.4 4.5 - 12.0 ug/dL   T3 Uptake Ratio 28 24 - 39 %   Free Thyroxine Index 1.8 1.2 - 4.9  Lipid panel  Result Value Ref Range   Cholesterol, Total 125 100 - 199 mg/dL   Triglycerides 101 0 - 149 mg/dL   HDL 45 >39 mg/dL   VLDL Cholesterol Cal 20 5 - 40 mg/dL   LDL Calculated 60 0 - 99 mg/dL   Chol/HDL Ratio 2.8 0.0 - 4.4 ratio  VITAMIN D 25 Hydroxy (Vit-D Deficiency, Fractures)  Result Value Ref Range   Vit D, 25-Hydroxy 38.8 30.0 - 100.0 ng/mL       Pertinent labs & imaging results that were available during my care of the patient were reviewed by me and considered in my medical decision making.  Assessment & Plan:  Misty [name] was seen today for medical management of chronic issues and hyperlipidemia.  Diagnoses and all orders for this visit:  Pure hypercholesterolemia Aortic Atherosclerosis ([place]) Intolerant to statins. Will check lipids today. Diet and exercise encouraged.  -     Lipid panel  Vitamin D deficiency -     VITAMIN D 25 Hydroxy (Vit-D Deficiency, Fractures)  Gastroesophageal reflux disease, esophagitis presence not specified Well controlled with PPI. No hemoptysis, voice changes, cough, or dysphagia. Will check CBC today.  -     CBC with Differential/Platelet  GAD (generalized anxiety disorder) Depression, recurrent ([place]) Controlled substance agreement signed Continue Lexapro as prescribed. Long discussion about Xanax and proper use. Pt has been taking an unprescribed amount. Last RX was for 0.5 mg nightly as needed for anxiety. Pt has  continued to take 0.5 mg three times daily. Explained to pt the adverse effects that can be  caused by this medication and the improper use of this medication. Pt states she can not go without the medications. Pt informed the dose will slowly be tapered back until she is only taking at night as needed or off of it all together. Pt has bottle with her today that was filled on 06/23/2019, #90, only 2 pills left in bottle today. Pt advised the Xanax will be cut back to 0.5 mg twice daily as needed and then will be tapered down more. She is aware this will not be filled until 07/24/2019 when due. Pt does not wish to do this but agrees.  -     ToxASSURE Select 13 (MW), Urine -     ALPRAZolam (XANAX) 0.5 MG tablet; Take 1 tablet (0.5 mg total) by mouth 2 (two) times daily as needed for anxiety.  Elevated alkaline phosphatase level Previously elevated Alk Phos, will get GGT today. No abdominal pain or icterus.  -     CMP14+EGFR -     Gamma GT   Continue all other maintenance medications.  Follow up plan: Return in about 3 months (around 10/09/2019), or if symptoms worsen or fail to improve.  Continue healthy lifestyle choices, including diet (rich in fruits, vegetables, and lean proteins, and low in salt and simple carbohydrates) and exercise (at least 30 minutes of moderate physical activity daily).  Educational handout given for survey, COVID-19  The above assessment and management plan was discussed with the patient. The patient verbalized understanding of and has agreed to the management plan. Patient is aware to call the clinic if symptoms persist or worsen. Patient is aware when to return to the clinic for a follow-up visit. Patient educated on when it is appropriate to go to the emergency department.   Monia Pouch, FNP-C [facility] [phone]

## 2019-07-10 ENCOUNTER — Inpatient Hospital Stay (HOSPITAL_COMMUNITY)
Admission: EM | Admit: 2019-07-10 | Discharge: 2019-07-13 | DRG: 812 | Disposition: A | Discharge status: Home / Self Care | Payer: Medicare Other | Attending: Internal Medicine | Admitting: Internal Medicine

## 2019-07-10 ENCOUNTER — Encounter (HOSPITAL_COMMUNITY): Payer: Self-pay

## 2019-07-10 ENCOUNTER — Emergency Department (HOSPITAL_COMMUNITY): Payer: Medicare Other

## 2019-07-10 ENCOUNTER — Other Ambulatory Visit: Payer: Self-pay

## 2019-07-10 DIAGNOSIS — E785 Hyperlipidemia, unspecified: Secondary | ICD-10-CM | POA: Diagnosis present

## 2019-07-10 DIAGNOSIS — G8929 Other chronic pain: Secondary | ICD-10-CM | POA: Diagnosis present

## 2019-07-10 DIAGNOSIS — I700 Atherosclerosis of aorta: Secondary | ICD-10-CM | POA: Diagnosis present

## 2019-07-10 DIAGNOSIS — K922 Gastrointestinal hemorrhage, unspecified: Secondary | ICD-10-CM | POA: Diagnosis not present

## 2019-07-10 DIAGNOSIS — F419 Anxiety disorder, unspecified: Secondary | ICD-10-CM | POA: Diagnosis present

## 2019-07-10 DIAGNOSIS — E559 Vitamin D deficiency, unspecified: Secondary | ICD-10-CM | POA: Diagnosis present

## 2019-07-10 DIAGNOSIS — F329 Major depressive disorder, single episode, unspecified: Secondary | ICD-10-CM | POA: Diagnosis not present

## 2019-07-10 DIAGNOSIS — K644 Residual hemorrhoidal skin tags: Secondary | ICD-10-CM | POA: Diagnosis present

## 2019-07-10 DIAGNOSIS — R195 Other fecal abnormalities: Secondary | ICD-10-CM

## 2019-07-10 DIAGNOSIS — Z79899 Other long term (current) drug therapy: Secondary | ICD-10-CM

## 2019-07-10 DIAGNOSIS — M810 Age-related osteoporosis without current pathological fracture: Secondary | ICD-10-CM | POA: Diagnosis present

## 2019-07-10 DIAGNOSIS — I482 Chronic atrial fibrillation, unspecified: Secondary | ICD-10-CM | POA: Diagnosis not present

## 2019-07-10 DIAGNOSIS — K766 Portal hypertension: Secondary | ICD-10-CM | POA: Diagnosis not present

## 2019-07-10 DIAGNOSIS — Z20828 Contact with and (suspected) exposure to other viral communicable diseases: Secondary | ICD-10-CM | POA: Diagnosis not present

## 2019-07-10 DIAGNOSIS — D509 Iron deficiency anemia, unspecified: Secondary | ICD-10-CM | POA: Diagnosis not present

## 2019-07-10 DIAGNOSIS — Z8249 Family history of ischemic heart disease and other diseases of the circulatory system: Secondary | ICD-10-CM

## 2019-07-10 DIAGNOSIS — Z66 Do not resuscitate: Secondary | ICD-10-CM | POA: Diagnosis present

## 2019-07-10 DIAGNOSIS — Z90710 Acquired absence of both cervix and uterus: Secondary | ICD-10-CM

## 2019-07-10 DIAGNOSIS — K5730 Diverticulosis of large intestine without perforation or abscess without bleeding: Secondary | ICD-10-CM | POA: Diagnosis present

## 2019-07-10 DIAGNOSIS — G4700 Insomnia, unspecified: Secondary | ICD-10-CM | POA: Diagnosis present

## 2019-07-10 DIAGNOSIS — D649 Anemia, unspecified: Secondary | ICD-10-CM | POA: Diagnosis present

## 2019-07-10 DIAGNOSIS — I5022 Chronic systolic (congestive) heart failure: Secondary | ICD-10-CM | POA: Diagnosis not present

## 2019-07-10 DIAGNOSIS — R531 Weakness: Secondary | ICD-10-CM | POA: Diagnosis not present

## 2019-07-10 DIAGNOSIS — F411 Generalized anxiety disorder: Secondary | ICD-10-CM | POA: Diagnosis present

## 2019-07-10 DIAGNOSIS — F17210 Nicotine dependence, cigarettes, uncomplicated: Secondary | ICD-10-CM | POA: Diagnosis present

## 2019-07-10 DIAGNOSIS — D500 Iron deficiency anemia secondary to blood loss (chronic): Secondary | ICD-10-CM | POA: Diagnosis not present

## 2019-07-10 DIAGNOSIS — F339 Major depressive disorder, recurrent, unspecified: Secondary | ICD-10-CM | POA: Diagnosis present

## 2019-07-10 DIAGNOSIS — J449 Chronic obstructive pulmonary disease, unspecified: Secondary | ICD-10-CM | POA: Diagnosis present

## 2019-07-10 DIAGNOSIS — K900 Celiac disease: Secondary | ICD-10-CM | POA: Diagnosis present

## 2019-07-10 DIAGNOSIS — D62 Acute posthemorrhagic anemia: Principal | ICD-10-CM | POA: Diagnosis present

## 2019-07-10 DIAGNOSIS — K449 Diaphragmatic hernia without obstruction or gangrene: Secondary | ICD-10-CM | POA: Diagnosis not present

## 2019-07-10 DIAGNOSIS — Z888 Allergy status to other drugs, medicaments and biological substances status: Secondary | ICD-10-CM

## 2019-07-10 DIAGNOSIS — K228 Other specified diseases of esophagus: Secondary | ICD-10-CM | POA: Diagnosis not present

## 2019-07-10 DIAGNOSIS — F418 Other specified anxiety disorders: Secondary | ICD-10-CM | POA: Diagnosis not present

## 2019-07-10 DIAGNOSIS — K3189 Other diseases of stomach and duodenum: Secondary | ICD-10-CM | POA: Diagnosis present

## 2019-07-10 DIAGNOSIS — Z7901 Long term (current) use of anticoagulants: Secondary | ICD-10-CM

## 2019-07-10 DIAGNOSIS — K648 Other hemorrhoids: Secondary | ICD-10-CM | POA: Diagnosis not present

## 2019-07-10 DIAGNOSIS — Z886 Allergy status to analgesic agent status: Secondary | ICD-10-CM

## 2019-07-10 DIAGNOSIS — J9811 Atelectasis: Secondary | ICD-10-CM | POA: Diagnosis not present

## 2019-07-10 DIAGNOSIS — K210 Gastro-esophageal reflux disease with esophagitis: Secondary | ICD-10-CM | POA: Diagnosis present

## 2019-07-10 DIAGNOSIS — Z881 Allergy status to other antibiotic agents status: Secondary | ICD-10-CM

## 2019-07-10 DIAGNOSIS — Z885 Allergy status to narcotic agent status: Secondary | ICD-10-CM

## 2019-07-10 LAB — CBC WITH DIFFERENTIAL/PLATELET
Basophils Absolute: 0.1 10*3/uL (ref 0.0–0.2)
Basos: 1 %
EOS (ABSOLUTE): 0.5 10*3/uL — ABNORMAL HIGH (ref 0.0–0.4)
Eos: 7 %
Hematocrit: 22.9 % — ABNORMAL LOW (ref 34.0–46.6)
Hemoglobin: 6.5 g/dL — CL (ref 11.1–15.9)
Immature Grans (Abs): 0 10*3/uL (ref 0.0–0.1)
Immature Granulocytes: 0 %
Lymphocytes Absolute: 0.8 10*3/uL (ref 0.7–3.1)
Lymphs: 12 %
MCH: 26.4 pg — ABNORMAL LOW (ref 26.6–33.0)
MCHC: 28.4 g/dL — ABNORMAL LOW (ref 31.5–35.7)
MCV: 93 fL (ref 79–97)
Monocytes Absolute: 0.5 10*3/uL (ref 0.1–0.9)
Monocytes: 7 %
Neutrophils Absolute: 5.3 10*3/uL (ref 1.4–7.0)
Neutrophils: 73 %
Platelets: 264 10*3/uL (ref 150–450)
RBC: 2.46 x10E6/uL — CL (ref 3.77–5.28)
RDW: 13.6 % (ref 11.7–15.4)
WBC: 7.2 10*3/uL (ref 3.4–10.8)

## 2019-07-10 LAB — CMP14+EGFR
ALT: 9 IU/L (ref 0–32)
AST: 15 IU/L (ref 0–40)
Albumin/Globulin Ratio: 1.6 (ref 1.2–2.2)
Albumin: 3.6 g/dL — ABNORMAL LOW (ref 3.7–4.7)
Alkaline Phosphatase: 129 IU/L — ABNORMAL HIGH (ref 39–117)
BUN/Creatinine Ratio: 13 (ref 12–28)
BUN: 11 mg/dL (ref 8–27)
Bilirubin Total: <0.2 mg/dL (ref 0.0–1.2)
CO2: 26 mmol/L (ref 20–29)
Calcium: 8.7 mg/dL (ref 8.7–10.3)
Chloride: 101 mmol/L (ref 96–106)
Creatinine, Ser: 0.87 mg/dL (ref 0.57–1.00)
GFR calc Af Amer: 74 mL/min/{1.73_m2} (ref >59)
GFR calc non Af Amer: 64 mL/min/{1.73_m2} (ref >59)
Globulin, Total: 2.2 g/dL (ref 1.5–4.5)
Glucose: 128 mg/dL — ABNORMAL HIGH (ref 65–99)
Potassium: 4.2 mmol/L (ref 3.5–5.2)
Sodium: 143 mmol/L (ref 134–144)
Total Protein: 5.8 g/dL — ABNORMAL LOW (ref 6.0–8.5)

## 2019-07-10 LAB — BASIC METABOLIC PANEL
Anion gap: 10 (ref 5–15)
BUN: 13 mg/dL (ref 8–23)
CO2: 27 mmol/L (ref 22–32)
Calcium: 8.3 mg/dL — ABNORMAL LOW (ref 8.9–10.3)
Chloride: 104 mmol/L (ref 98–111)
Creatinine, Ser: 0.9 mg/dL (ref 0.44–1.00)
GFR calc Af Amer: >60 mL/min (ref >60)
GFR calc non Af Amer: >60 mL/min (ref >60)
Glucose, Bld: 136 mg/dL — ABNORMAL HIGH (ref 70–99)
Potassium: 3.6 mmol/L (ref 3.5–5.1)
Sodium: 141 mmol/L (ref 135–145)

## 2019-07-10 LAB — HEPATIC FUNCTION PANEL
ALT: 11 U/L (ref 0–44)
AST: 13 U/L — ABNORMAL LOW (ref 15–41)
Albumin: 3.1 g/dL — ABNORMAL LOW (ref 3.5–5.0)
Alkaline Phosphatase: 106 U/L (ref 38–126)
Bilirubin, Direct: <0.1 mg/dL (ref 0.0–0.2)
Total Bilirubin: 0.3 mg/dL (ref 0.3–1.2)
Total Protein: 6 g/dL — ABNORMAL LOW (ref 6.5–8.1)

## 2019-07-10 LAB — CBC
HCT: 22.7 % — ABNORMAL LOW (ref 36.0–46.0)
Hemoglobin: 5.9 g/dL — CL (ref 12.0–15.0)
MCH: 27.3 pg (ref 26.0–34.0)
MCHC: 26 g/dL — ABNORMAL LOW (ref 30.0–36.0)
MCV: 105.1 fL — ABNORMAL HIGH (ref 80.0–100.0)
Platelets: 255 10*3/uL (ref 150–400)
RBC: 2.16 MIL/uL — ABNORMAL LOW (ref 3.87–5.11)
RDW: 16.4 % — ABNORMAL HIGH (ref 11.5–15.5)
WBC: 6.5 10*3/uL (ref 4.0–10.5)
nRBC: 0 % (ref 0.0–0.2)

## 2019-07-10 LAB — POC OCCULT BLOOD, ED: Fecal Occult Bld: POSITIVE — AB

## 2019-07-10 LAB — LIPID PANEL
Chol/HDL Ratio: 2.8 ratio (ref 0.0–4.4)
Cholesterol, Total: 125 mg/dL (ref 100–199)
HDL: 45 mg/dL (ref >39)
LDL Calculated: 60 mg/dL (ref 0–99)
Triglycerides: 101 mg/dL (ref 0–149)
VLDL Cholesterol Cal: 20 mg/dL (ref 5–40)

## 2019-07-10 LAB — VITAMIN D 25 HYDROXY (VIT D DEFICIENCY, FRACTURES): Vit D, 25-Hydroxy: 38.8 ng/mL (ref 30.0–100.0)

## 2019-07-10 LAB — PREPARE RBC (CROSSMATCH)

## 2019-07-10 LAB — THYROID PANEL WITH TSH
Free Thyroxine Index: 1.8 (ref 1.2–4.9)
T3 Uptake Ratio: 28 % (ref 24–39)
T4, Total: 6.4 ug/dL (ref 4.5–12.0)
TSH: 2.34 u[IU]/mL (ref 0.450–4.500)

## 2019-07-10 LAB — BRAIN NATRIURETIC PEPTIDE: B Natriuretic Peptide: 365 pg/mL — ABNORMAL HIGH (ref 0.0–100.0)

## 2019-07-10 LAB — GAMMA GT: GGT: 13 IU/L (ref 0–60)

## 2019-07-10 LAB — SARS CORONAVIRUS 2 BY RT PCR (HOSPITAL ORDER, PERFORMED IN ~~LOC~~ HOSPITAL LAB): SARS Coronavirus 2: NEGATIVE

## 2019-07-10 MED ORDER — SODIUM CHLORIDE 0.9 % IV SOLN
10.0000 mL/h | Freq: Once | INTRAVENOUS | Status: AC
  Administered 2019-07-10: 20:00:00 10 mL/h via INTRAVENOUS

## 2019-07-10 MED ORDER — ACETAMINOPHEN 325 MG PO TABS
650.0000 mg | ORAL_TABLET | Freq: Four times a day (QID) | ORAL | Status: DC | PRN
  Administered 2019-07-11 – 2019-07-13 (×3): 650 mg via ORAL
  Filled 2019-07-10 (×3): qty 2

## 2019-07-10 MED ORDER — ACETAMINOPHEN 650 MG RE SUPP: 650.0000 mg | Freq: Four times a day (QID) | RECTAL | Status: DC | PRN

## 2019-07-10 MED ORDER — ESCITALOPRAM OXALATE 10 MG PO TABS
20.0000 mg | ORAL_TABLET | Freq: Every day | ORAL | Status: DC
  Administered 2019-07-11 – 2019-07-12 (×2): 20 mg via ORAL
  Filled 2019-07-10 (×2): qty 2

## 2019-07-10 MED ORDER — PANTOPRAZOLE SODIUM 40 MG IV SOLR
40.0000 mg | Freq: Two times a day (BID) | INTRAVENOUS | Status: DC
  Administered 2019-07-10 – 2019-07-11 (×2): 40 mg via INTRAVENOUS
  Filled 2019-07-10 (×2): qty 40

## 2019-07-10 MED ORDER — ONDANSETRON HCL 4 MG/2ML IJ SOLN: 4.0000 mg | Freq: Four times a day (QID) | INTRAMUSCULAR | Status: DC | PRN

## 2019-07-10 MED ORDER — FUROSEMIDE 20 MG PO TABS
20.0000 mg | ORAL_TABLET | Freq: Every day | ORAL | Status: DC
  Administered 2019-07-12 – 2019-07-13 (×2): 20 mg via ORAL
  Filled 2019-07-10 (×3): qty 1

## 2019-07-10 MED ORDER — ACETAMINOPHEN 325 MG PO TABS
650.0000 mg | ORAL_TABLET | Freq: Once | ORAL | Status: AC
  Administered 2019-07-10: 20:00:00 650 mg via ORAL
  Filled 2019-07-10: qty 2

## 2019-07-10 MED ORDER — BISOPROLOL FUMARATE 5 MG PO TABS
5.0000 mg | ORAL_TABLET | Freq: Every day | ORAL | Status: DC
  Administered 2019-07-12 – 2019-07-13 (×2): 5 mg via ORAL
  Filled 2019-07-10 (×3): qty 1

## 2019-07-10 MED ORDER — ONDANSETRON HCL 4 MG PO TABS: 4.0000 mg | ORAL_TABLET | Freq: Four times a day (QID) | ORAL | Status: DC | PRN

## 2019-07-10 MED ORDER — SODIUM CHLORIDE 0.9 % IV SOLN: INTRAVENOUS | Status: DC

## 2019-07-10 MED ORDER — ALBUTEROL SULFATE (2.5 MG/3ML) 0.083% IN NEBU: 2.5000 mg | INHALATION_SOLUTION | Freq: Four times a day (QID) | RESPIRATORY_TRACT | Status: DC | PRN

## 2019-07-10 NOTE — ED Provider Notes (Signed)
[facility] EMERGENCY DEPARTMENT Provider Note   CSN: [id number] Arrival date & time: 07/10/19  1332     History   Chief Complaint Chief Complaint  Patient presents with  . Abnormal Lab    HPI Misty [name] is a 78 y.o. female.     Patient sent in from [place] [facility] patient's hemoglobin is 6.5.  Patient's weak lethargic and pale.  Patient states she has a history of chronic anemia.  Patient states she has been told that she has had blood in her bowel movements in the past.  States she seen gastroenterology.  However past medical records do not show that she is been seen.  Also followed by cardiology has a history of atrial fibrillation patient has a history of acute systolic congestive heart failure.  Patient is Eliquis started in May of this year.     Past Medical History:  Diagnosis Date  . Anxiety    takes Xanax daily  . Arthritis    back  . Atrial fibrillation with RVR ([place]) 03/25/2019  . Cataract   . Chronic back pain   . Constipation    OTC stool softener prn  . COPD (chronic obstructive pulmonary disease) ([place])   . Depression   . Diverticulosis   . GERD (gastroesophageal reflux disease)    takes Ranidine daily  . Hemorrhoids   . History of bronchitis   . History of migraine    many yrs ago  . History of shingles   . Hyperlipidemia   . Insomnia   . Migraines   . Osteoporosis   . PONV (postoperative nausea and vomiting)     Patient Active Problem List   Diagnosis Date Noted  . Aortic atherosclerosis ([place]) 07/09/2019  . Vitamin D deficiency 07/09/2019  . GAD (generalized anxiety disorder) 07/09/2019  . Depression, recurrent ([place]) 07/09/2019  . Controlled substance agreement signed 07/09/2019  . Educated About Covid-19 Virus Infection 04/07/2019  . S/P thoracentesis   . Pleural effusion, right   . Acute systolic heart failure ([place]) 03/31/2019  . Lobar pneumonia ([place])   . Acute hypoxemic respiratory failure ([place])   . Community  acquired pneumonia 03/26/2019  . Elevated troponin 03/25/2019  . S/P IM nail left hip 01/07/2019 01/26/2019  . H/O kyphoplasty 01/14/2019  . Iron deficiency anemia 01/14/2019  . Acute blood loss anemia 01/13/2019  . Constipation due to opioid therapy 01/13/2019  . Anxiety and depression 01/13/2019  . Hypokalemia 01/10/2019  . Displaced intertrochanteric fracture of left femur, sequela 01/06/2019  . Chronic midline low back pain without sciatica 10/09/2018  . Chronic midline thoracic back pain 10/09/2018  . Osteopenia determined by x-ray 04/16/2018  . Pulmonary fibrosis ([place]) 04/07/2018  . Non-intractable vomiting with nausea 10/06/2017  . Malnutrition of moderate degree 01/29/2016  . Generalized weakness 01/27/2016  . Abnormal chest x-ray 01/27/2016  . Tobacco use disorder 01/27/2016  . Colitis   . Hematochezia 02/26/2015  . Ischemic colitis ([place]) 02/26/2015  . Orthostatic hypotension 02/26/2015  . Nontraumatic compression fracture of T11 vertebra ([facility]) 07/08/2014  . Hyperlipidemia 08/19/2013  . Fibrocystic breast disease 08/19/2013  . History of migraine headaches 08/19/2013  . GERD (gastroesophageal reflux disease) 04/07/2013  . Osteoporosis, postmenopausal 04/07/2013    Past Surgical History:  Procedure Laterality Date  . ABDOMINAL HYSTERECTOMY    . COLONOSCOPY    . COLONOSCOPY N/A 02/17/2015   Procedure: COLONOSCOPY;  Surgeon: Rogene Houston, MD;  Location[facility];  Service: Endoscopy;  Laterality: N/A;  110n - moved to 11:15 - Ann to notify pt  . ESOPHAGOGASTRODUODENOSCOPY    . ESOPHAGOGASTRODUODENOSCOPY N/A 01/29/2016   Procedure: ESOPHAGOGASTRODUODENOSCOPY (EGD);  Surgeon: Rogene Houston, MD;  Location: [facility];  Service: Endoscopy;  Laterality: N/A;  . ESOPHAGOGASTRODUODENOSCOPY N/A 10/14/2017   Procedure: ESOPHAGOGASTRODUODENOSCOPY (EGD);  Surgeon: Rogene Houston, MD;  Location: [facility];  Service: Endoscopy;  Laterality: N/A;  730  . EYE SURGERY  Right 3/15   cataracts  . INTRAMEDULLARY (IM) NAIL INTERTROCHANTERIC Left 01/07/2019   Procedure: INTRAMEDULLARY (IM) NAIL INTERTROCHANTRIC;  Surgeon: Carole Civil, MD;  Location: [facility] ORS;  Service: Orthopedics;  Laterality: Left;  . KYPHOPLASTY N/A 07/08/2014   Procedure: Thoracic Eleven Kyphoplasty;  Surgeon: Hosie Spangle, MD;  Location: [facility];  Service: Neurosurgery;  Laterality: N/A;  . left hip surgery    . LUMBAR LAMINECTOMY/DECOMPRESSION MICRODISCECTOMY Bilateral 05/20/2013   Procedure: LUMBAR LAMINECTOMY/DECOMPRESSION MICRODISCECTOMY 1 LEVEL;  Surgeon: Hosie Spangle, MD;  Location: [facility];  Service: Neurosurgery;  Laterality: Bilateral;  Bilateral Lumbar four-five laminotomy and left lumbar four-five microdiskectomy  . RIGHT/LEFT HEART CATH AND CORONARY ANGIOGRAPHY N/A 03/31/2019   Procedure: RIGHT/LEFT HEART CATH AND CORONARY ANGIOGRAPHY;  Surgeon: Sherren Mocha, MD;  Location: [facility];  Service: Cardiovascular;  Laterality: N/A;  . SPINE SURGERY     Dr Carloyn Manner -  vertebraplasty     OB History   No obstetric history on file.      Home Medications    Prior to Admission medications   Medication Sig Start Date End Date Taking? Authorizing Provider  acetaminophen (TYLENOL) 500 MG tablet Take 500 mg by mouth every 8 (eight) hours.  01/20/19   [provider]  albuterol (PROVENTIL HFA;VENTOLIN HFA) 108 (90 Base) MCG/ACT inhaler Inhale 2 puffs into the lungs every 6 (six) hours as needed for wheezing or shortness of breath (cough). 01/28/19   Gerlene Fee, NP  ALPRAZolam Duanne Moron) 0.5 MG tablet Take 1 tablet (0.5 mg total) by mouth 2 (two) times daily as needed for anxiety. 07/24/19 08/23/19  Baruch Gouty, FNP  apixaban (ELIQUIS) 5 MG TABS tablet Take 1 tablet (5 mg total) by mouth 2 (two) times daily. 04/07/19   Minus Breeding, MD  bisoprolol (ZEBETA) 5 MG tablet Take 1 tablet (5 mg total) by mouth daily. 04/07/19   Minus Breeding, MD   cholecalciferol (VITAMIN D) 1000 UNITS tablet Take 2,000-4,000 Units by mouth daily. Take 2000 units daily except take 4000 units on Saturday and Sunday.    [provider]  escitalopram (LEXAPRO) 20 MG tablet TAKE ONE TABLET AT BEDTIME 06/24/19   Rehman, Mechele Dawley, MD  ferrous sulfate (FEOSOL) 325 (65 FE) MG tablet Take 1 tablet (325 mg total) by mouth 3 (three) times daily with meals. 01/28/19   Gerlene Fee, NP  furosemide (LASIX) 20 MG tablet Take 1 tablet (20 mg total) by mouth daily. 04/07/19   Minus Breeding, MD  losartan (COZAAR) 25 MG tablet Take 0.5 tablets (12.5 mg total) by mouth daily. 04/28/19   Minus Breeding, MD  magic mouthwash w/lidocaine SOLN Take 5 mLs by mouth 3 (three) times daily as needed for mouth pain. Patient not taking: Reported on 07/09/2019 03/23/19   Terald Sleeper, PA-C  Multiple Vitamin (MULTIVITAMIN WITH MINERALS) TABS tablet Take 1 tablet by mouth daily. Centrum Silver    [provider]  pantoprazole (PROTONIX) 40 MG tablet Take 1 tablet (40 mg total)  by mouth 2 (two) timesdaily before a meal. 05/24/19   Rehman, Mechele Dawley, MD  potassium chloride SA (K-DUR) 20 MEQ tablet Take 1 tablet (20 mEq total) by mouth daily for 30 days. 04/19/19 07/09/19  Baruch Gouty, FNP  vitamin B-12 (CYANOCOBALAMIN) 1000 MCG tablet Take 1,000 mcg by mouth daily.    [provider]    Family History Family History  Problem Relation Age of Onset  . Hypertension Mother   . Heart attack Mother   . Macular degeneration Father   . Heart disease Father   . Cirrhosis Son   . Heart disease Son   . Colon cancer Neg Hx     Social History Social History   Tobacco Use  . Smoking status: Current Some Day Smoker    Packs/day: 1.00    Years: 52.00    Pack years: 52.00    Types: Cigarettes    Start date: 11/18/1958    Last attempt to quit: 08/20/2011    Years since quitting: 7.8  . Smokeless tobacco: Never Used  . Tobacco comment: 1/2-1pack off and on all her life   Substance Use Topics  . Alcohol use: No    Alcohol/week: 0.0 standard drinks  . Drug use: No     Allergies   Codeine, Tramadol, Asa [aspirin], Azithromycin, Celebrex [celecoxib], Cymbalta [duloxetine hcl], Prednisone, Vioxx [rofecoxib], Zelnorm [tegaserod], and Zocor [simvastatin]   Review of Systems Review of Systems  Constitutional: Positive for fatigue. Negative for chills and fever.  HENT: Negative for rhinorrhea and sore throat.   Eyes: Negative for visual disturbance.  Respiratory: Negative for cough and shortness of breath.   Cardiovascular: Negative for chest pain and leg swelling.  Gastrointestinal: Negative for abdominal pain, blood in stool, diarrhea, nausea and vomiting.  Genitourinary: Negative for dysuria.  Musculoskeletal: Negative for back pain and neck pain.  Skin: Negative for rash.  Neurological: Positive for weakness. Negative for dizziness, light-headedness and headaches.  Hematological: Does not bruise/bleed easily.  Psychiatric/Behavioral: Negative for confusion.     Physical Exam Updated Vital Signs BP (!) 105/54 (BP Location: Right Arm)   Pulse 81   Temp 98.6 F (37 C) (Oral)   Resp 11   Ht 1.702 m (5' 7" )   Wt 53.5 kg   SpO2 93%   BMI 18.48 kg/m   Physical Exam Vitals signs and nursing note reviewed.  Constitutional:      General: She is not in acute distress.    Appearance: Normal appearance. She is well-developed.  HENT:     Head: Normocephalic and atraumatic.  Eyes:     Extraocular Movements: Extraocular movements intact.     Conjunctiva/sclera: Conjunctivae normal.     Pupils: Pupils are equal, round, and reactive to light.  Neck:     Musculoskeletal: Neck supple.  Cardiovascular:     Rate and Rhythm: Normal rate and regular rhythm.     Heart sounds: No murmur.  Pulmonary:     Effort: Pulmonary effort is normal. No respiratory distress.     Breath sounds: Normal breath sounds.  Abdominal:     Palpations: Abdomen is soft.      Tenderness: There is no abdominal tenderness.  Genitourinary:    Rectum: Guaiac result positive.  Skin:    General: Skin is warm and dry.     Coloration: Skin is pale.  Neurological:     General: No focal deficit present.     Mental Status: She is alert and oriented to person,  place, and time.      ED Treatments / Results  Labs (all labs ordered are listed, but only abnormal results are displayed) Labs Reviewed  CBC - Abnormal; Notable for the following components:      Result Value   RBC 2.16 (*)    Hemoglobin 5.9 (*)    HCT 22.7 (*)    MCV 105.1 (*)    MCHC 26.0 (*)    RDW 16.4 (*)    All other components within normal limits  BASIC METABOLIC PANEL - Abnormal; Notable for the following components:   Glucose, Bld 136 (*)    Calcium 8.3 (*)    All other components within normal limits  NOVEL CORONAVIRUS, NAA (HOSPITAL ORDER, SEND-OUT TO REF LAB)  HEPATIC FUNCTION PANEL  TYPE AND SCREEN  PREPARE RBC (CROSSMATCH)    EKG None  Radiology No results found.  Procedures Procedures (including critical care time)  CRITICAL CARE Performed by: Fredia Sorrow Total critical care time: 30 minutes Critical care time was exclusive of separately billable procedures and treating other patients. Critical care was necessary to treat or prevent imminent or life-threatening deterioration. Critical care was time spent personally by me on the following activities: development of treatment plan with patient and/or surrogate as well as nursing, discussions with consultants, evaluation of patient's response to treatment, examination of patient, obtaining history from patient or surrogate, ordering and performing treatments and interventions, ordering and review of laboratory studies, ordering and review of radiographic studies, pulse oximetry and re-evaluation of patient's condition.   Medications Ordered in ED Medications  0.9 %  sodium chloride infusion (has no administration in time  range)  0.9 %  sodium chloride infusion (has no administration in time range)     Initial Impression / Assessment and Plan / ED Course  I have reviewed the triage vital signs and the nursing notes.  Pertinent labs & imaging results that were available during my care of the patient were reviewed by me and considered in my medical decision making (see chart for details).    Patient's stool is heme positive but not grossly melanotic or any gross blood.  Most likely slow GI blood loss.  Patient is on Eliquis.  Assume for the atrial fibrillation.  EKG shows normal sinus rhythm here today.  Patient very pale.  Hemoglobin in the 5 range.  2 units of blood ordered.  They will need to go and slowly because of her history of congestive heart failure.  BNP in the 300s chest x-ray shows no significant fluid overload.  We will discussed with the hospitalist for admission.      Final Clinical Impressions(s) / ED Diagnoses   Final diagnoses:  None    ED Discharge Orders    None       Fredia Sorrow, MD 07/10/19 [phone]

## 2019-07-10 NOTE — ED Notes (Signed)
CRITICAL VALUE ALERT  Critical Value:  Hemoglobin 5.9  Date & Time Notied: 07/10/2019 1536  Provider Notified: Dr. Rogene Houston   Orders Received/Actions taken: None yet

## 2019-07-10 NOTE — H&P (Signed)
TRH H&P    Patient Demographics:    Misty [name], is a 78 y.o. female  MRN: [id number]  DOB - 02-Dec-1940  Admit Date - 07/10/2019  Referring MD/NP/PA: Aundria Mems  Outpatient Primary MD for the patient is Rakes, Connye Burkitt, FNP  Patient coming from: PCP clinic  Chief complaint-abnormal labs   HPI:    Misty [name]  is a 78 y.o. female, with history of chronic anemia, atrial fibrillation on anticoagulation with Eliquis, GERD, hyperlipidemia, chronic systolic CHF came to hospital after she was found to have abnormal lab at PCP office.  As per PCP office her hemoglobin was 6.5.  She was told to go to ED for further evaluation.  Patient denies black stool, though she takes iron supplementation.  She denies chest pain or shortness of breath.  Denies dizziness or syncopal episode.  Patient was started on Eliquis in May of this year for atrial fibrillation. In the ED lab work showed hemoglobin 5.9.  2 units PRBC were ordered.  No previous history of stroke or seizures. Denies nausea vomiting or diarrhea Denies abdominal pain or dysuria Denies vomiting any blood No blood per rectum  FOBT was positive in the ED    Review of systems:    In addition to the HPI above,    All other systems reviewed and are negative.    Past History of the following :    Past Medical History:  Diagnosis Date   Anxiety    takes Xanax daily   Arthritis    back   Atrial fibrillation with RVR (HCC) 03/25/2019   Cataract    Chronic back pain    Constipation    OTC stool softener prn   COPD (chronic obstructive pulmonary disease) (HCC)    Depression    Diverticulosis    GERD (gastroesophageal reflux disease)    takes Ranidine daily   Hemorrhoids    History of bronchitis    History of migraine    many yrs ago   History of shingles    Hyperlipidemia    Insomnia    Migraines    Osteoporosis     PONV (postoperative nausea and vomiting)       Past Surgical History:  Procedure Laterality Date   ABDOMINAL HYSTERECTOMY     COLONOSCOPY     COLONOSCOPY N/A 02/17/2015   Procedure: COLONOSCOPY;  Surgeon: Rogene Houston, MD;  [facility];  Service: Endoscopy;  Laterality: N/A;  110n - moved to 11:15 - Ann to notify pt   ESOPHAGOGASTRODUODENOSCOPY     ESOPHAGOGASTRODUODENOSCOPY N/A 01/29/2016   Procedure: ESOPHAGOGASTRODUODENOSCOPY (EGD);  Surgeon: Rogene Houston, MD;  [facility];  Service: Endoscopy;  Laterality: N/A;   ESOPHAGOGASTRODUODENOSCOPY N/A 10/14/2017   Procedure: ESOPHAGOGASTRODUODENOSCOPY (EGD);  Surgeon: Rogene Houston, MD;  [facility];  Service: Endoscopy;  Laterality: N/A;  730   EYE SURGERY Right 3/15   cataracts   INTRAMEDULLARY (IM) NAIL INTERTROCHANTERIC Left 01/07/2019   Procedure: INTRAMEDULLARY (IM) NAIL INTERTROCHANTRIC;  Surgeon: Carole Civil,  MD;  Location: [facility];  Service: Orthopedics;  Laterality: Left;   KYPHOPLASTY N/A 07/08/2014   Procedure: Thoracic Eleven Kyphoplasty;  Surgeon: Hosie Spangle, MD;  Location: [place] [facility];  Service: Neurosurgery;  Laterality: N/A;   left hip surgery     LUMBAR LAMINECTOMY/DECOMPRESSION MICRODISCECTOMY Bilateral 05/20/2013   Procedure: LUMBAR LAMINECTOMY/DECOMPRESSION MICRODISCECTOMY 1 LEVEL;  Surgeon: Hosie Spangle, MD;  Location: [place] [facility];  Service: Neurosurgery;  Laterality: Bilateral;  Bilateral Lumbar four-five laminotomy and left lumbar four-five microdiskectomy   RIGHT/LEFT HEART CATH AND CORONARY ANGIOGRAPHY N/A 03/31/2019   Procedure: RIGHT/LEFT HEART CATH AND CORONARY ANGIOGRAPHY;  Surgeon: Sherren Mocha, MD;  Location: [facility];  Service: Cardiovascular;  Laterality: N/A;   SPINE SURGERY     Dr Carloyn Manner -  vertebraplasty      Social History:      Social History   Tobacco Use   Smoking status: Current Some Day Smoker    Packs/day:  1.00    Years: 52.00    Pack years: 52.00    Types: Cigarettes    Start date: 11/18/1958    Last attempt to quit: 08/20/2011    Years since quitting: 7.8   Smokeless tobacco: Never Used   Tobacco comment: 1/2-1pack off and on all her life  Substance Use Topics   Alcohol use: No    Alcohol/week: 0.0 standard drinks       Family History :     Family History  Problem Relation Age of Onset   Hypertension Mother    Heart attack Mother    Macular degeneration Father    Heart disease Father    Cirrhosis Son    Heart disease Son    Colon cancer Neg Hx       Home Medications:   Prior to Admission medications   Medication Sig Start Date End Date Taking? Authorizing Provider  acetaminophen (TYLENOL) 500 MG tablet Take 500 mg by mouth every 8 (eight) hours.  01/20/19  Yes [provider]  albuterol (PROVENTIL HFA;VENTOLIN HFA) 108 (90 Base) MCG/ACT inhaler Inhale 2 puffs into the lungs every 6 (six) hours as needed for wheezing or shortness of breath (cough). 01/28/19  Yes Gerlene Fee, NP  ALPRAZolam Duanne Moron) 0.5 MG tablet Take 1 tablet (0.5 mg total) by mouth 2 (two) times daily as needed for anxiety. 07/24/19 08/23/19 Yes Rakes, Connye Burkitt, FNP  apixaban (ELIQUIS) 5 MG TABS tablet Take 1 tablet (5 mg total) by mouth 2 (two) times daily. 04/07/19  Yes Minus Breeding, MD  bisoprolol (ZEBETA) 5 MG tablet Take 1 tablet (5 mg total) by mouth daily. 04/07/19  Yes Minus Breeding, MD  cholecalciferol (VITAMIN D) 1000 UNITS tablet Take 2,000-4,000 Units by mouth daily. Take 2000 units daily except take 4000 units on Saturday and Sunday.   Yes [provider]  escitalopram (LEXAPRO) 20 MG tablet TAKE ONE TABLET AT BEDTIME 06/24/19  Yes Rehman, Mechele Dawley, MD  ferrous sulfate (FEOSOL) 325 (65 FE) MG tablet Take 1 tablet (325 mg total) by mouth 3 (three) times daily with meals. 01/28/19  Yes Gerlene Fee, NP  furosemide (LASIX) 20 MG tablet Take 1 tablet (20 mg total) by mouth  daily. 04/07/19  Yes Minus Breeding, MD  losartan (COZAAR) 25 MG tablet Take 0.5 tablets (12.5 mg total) by mouth daily. 04/28/19  Yes Minus Breeding, MD  Multiple Vitamin (MULTIVITAMIN WITH MINERALS) TABS tablet Take 1 tablet by mouth daily. Centrum Silver   Yes [provider]  pantoprazole (PROTONIX) 40 MG tablet Take 1 tablet (40 mg total) by mouth 2 (two) timesdaily before a meal. 05/24/19  Yes Rehman, Mechele Dawley, MD  vitamin B-12 (CYANOCOBALAMIN) 1000 MCG tablet Take 1,000 mcg by mouth daily.   Yes [provider]  magic mouthwash w/lidocaine SOLN Take 5 mLs by mouth 3 (three) times daily as needed for mouth pain. Patient not taking: Reported on 07/09/2019 03/23/19   Terald Sleeper, PA-C  potassium chloride SA (K-DUR) 20 MEQ tablet Take 1 tablet (20 mEq total) by mouth daily for 30 days. 04/19/19 07/09/19  Baruch Gouty, FNP     Allergies:     Allergies  Allergen Reactions   Codeine Nausea And Vomiting   Tramadol Nausea And Vomiting   Asa [Aspirin] Other (See Comments)    Patient is unaware of allergy   Azithromycin Other (See Comments)    Patient is unaware of allergy   Celebrex [Celecoxib] Other (See Comments)    Irritated stomach    Cymbalta [Duloxetine Hcl] Other (See Comments)    Felt groggy   Prednisone Rash    She has seen the podiatrist and he had injected cortisone in her feet and she had also taken a peel at home. She had a severe reaction to her face with a rash and had to take Benadryl to resolve the symptom. She actually did get more cortisone shots, but no additional reactions to the shots.   Vioxx [Rofecoxib] Other (See Comments)    Unknown   Zelnorm [Tegaserod] Other (See Comments)    Patient is unaware of allergy   Zocor [Simvastatin] Other (See Comments)    Patient is unaware of allergy     Physical Exam:   Vitals  Blood pressure (!) 133/58, pulse 79, temperature 97.9 F (36.6 C), temperature source Oral, resp. rate 18, height 5' 7"   (1.702 m), weight 53.5 kg, SpO2 93 %.  1.  General: Appears in no acute distress  2. Psychiatric: Alert, oriented x3, intact insight and judgment  3. Neurologic: Cranial nerves II through XII grossly intact, moving all extremities, sensations intact bilaterally.  4. HEENMT:  Atraumatic normocephalic, extraocular muscles are intact,  5. Respiratory : Clear to auscultation bilaterally, no wheezing or crackles  6. Cardiovascular : S1-S2, regular, no murmur auscultated  7. Gastrointestinal:  Abdomen is soft, nontender, no organomegaly     Data Review:    CBC Recent Labs  Lab 07/09/19 1256 07/10/19 1421  WBC 7.2 6.5  HGB 6.5* 5.9*  HCT 22.9* 22.7*  PLT 264 255  MCV 93 105.1*  MCH 26.4* 27.3  MCHC 28.4* 26.0*  RDW 13.6 16.4*  LYMPHSABS 0.8  --   EOSABS 0.5*  --   BASOSABS 0.1  --    ------------------------------------------------------------------------------------------------------------------  Results for orders placed or performed during the hospital encounter of 07/10/19 (from the past 48 hour(s))  CBC     Status: Abnormal   Collection Time: 07/10/19  2:21 PM  Result Value Ref Range   WBC 6.5 4.0 - 10.5 K/uL   RBC 2.16 (L) 3.87 - 5.11 MIL/uL   Hemoglobin 5.9 (LL) 12.0 - 15.0 g/dL    Comment: This critical result has verified and been called to E. WILEY by Deanne Coffer on 08 22 2020 at 1531, and has been read back.  This critical result has verified and been called to E. WILEY by Deanne Coffer on 08 22 2020 at 1535, and has been read back.  HCT 22.7 (L) 36.0 - 46.0 %   MCV 105.1 (H) 80.0 - 100.0 fL   MCH 27.3 26.0 - 34.0 pg   MCHC 26.0 (L) 30.0 - 36.0 g/dL   RDW 16.4 (H) 11.5 - 15.5 %   Platelets 255 150 - 400 K/uL   nRBC 0.0 0.0 - 0.2 %    Comment: Performed at [facility], [address] [place], [place] [address]  Basic metabolic panel     Status: Abnormal   Collection Time: 07/10/19  2:21 PM  Result Value Ref Range   Sodium 141 135 - 145  mmol/L   Potassium 3.6 3.5 - 5.1 mmol/L   Chloride 104 98 - 111 mmol/L   CO2 27 22 - 32 mmol/L   Glucose, Bld 136 (H) 70 - 99 mg/dL   BUN 13 8 - 23 mg/dL   Creatinine, Ser 0.90 0.44 - 1.00 mg/dL   Calcium 8.3 (L) 8.9 - 10.3 mg/dL   GFR calc non Af Amer >60 >60 mL/min   GFR calc Af Amer >60 >60 mL/min   Anion gap 10 5 - 15    Comment: Performed at [facility], [address], [place], [place] [address]  Type and screen [facility]     Status: None (Preliminary result)   Collection Time: 07/10/19  2:21 PM  Result Value Ref Range   ABO/RH(D) O POS    Antibody Screen NEG    Sample Expiration 07/13/2019,2359    Unit Number [id number]    Blood Component Type RED CELLS,LR    Unit division 00    Status of Unit ISSUED    Transfusion Status OK TO TRANSFUSE    Crossmatch Result      Compatible Performed at [facility], [address], [place], [place] [address]    Unit Number [id number]    Blood Component Type RED CELLS,LR    Unit division 00    Status of Unit ALLOCATED    Transfusion Status OK TO TRANSFUSE    Crossmatch Result Compatible   Hepatic function panel     Status: Abnormal   Collection Time: 07/10/19  2:21 PM  Result Value Ref Range   Total Protein 6.0 (L) 6.5 - 8.1 g/dL   Albumin 3.1 (L) 3.5 - 5.0 g/dL   AST 13 (L) 15 - 41 U/L   ALT 11 0 - 44 U/L   Alkaline Phosphatase 106 38 - 126 U/L   Total Bilirubin 0.3 0.3 - 1.2 mg/dL   Bilirubin, Direct <0.1 0.0 - 0.2 mg/dL   Indirect Bilirubin NOT CALCULATED 0.3 - 0.9 mg/dL    Comment: Performed at [facility], [address], [place], [place] [address]  Brain natriuretic peptide     Status: Abnormal   Collection Time: 07/10/19  2:21 PM  Result Value Ref Range   B Natriuretic Peptide 365.0 (H) 0.0 - 100.0 pg/mL    Comment: Performed at [facility], [address] [place],  [place] [address]  Prepare RBC     Status: None   Collection Time: 07/10/19  4:02 PM  Result Value Ref Range   Order Confirmation       ORDER PROCESSED BY BLOOD BANK Performed at [facility], [address], [place], [place] [address]   POC occult blood, ED     Status: Abnormal   Collection Time: 07/10/19  6:54 PM  Result Value Ref Range   Fecal Occult Bld POSITIVE (A) NEGATIVE    Chemistries  Recent Labs  Lab 07/09/19 1256 07/10/19 1421  NA 143 141  K 4.2 3.6  CL 101 104  CO2 26 27  GLUCOSE 128* 136*  BUN 11 13  CREATININE 0.87 0.90  CALCIUM 8.7 8.3*  AST 15 13*  ALT 9 11  ALKPHOS 129* 106  BILITOT <0.2 0.3   ------------------------------------------------------------------------------------------------------------------  ------------------------------------------------------------------------------------------------------------------ GFR: Estimated Creatinine Clearance: 43.5 mL/min (by C-G formula based on SCr of 0.9 mg/dL). Liver Function Tests: Recent Labs  Lab 07/09/19 1256 07/10/19 1421  AST 15 13*  ALT 9 11  ALKPHOS 129* 106  BILITOT <0.2 0.3  PROT 5.8* 6.0*  ALBUMIN 3.6* 3.1*   Lipid Profile: Recent Labs    07/09/19 1256  CHOL 125  HDL 45  LDLCALC 60  TRIG 101  CHOLHDL 2.8   Thyroid Function Tests: Recent Labs    07/09/19 1256  TSH 2.340  T4TOTAL 6.4     --------------------------------------------------------------------------------------------------------------- Urine analysis:    Component Value Date/Time   COLORURINE YELLOW 01/14/2019 0226   APPEARANCEUR CLEAR 01/14/2019 0226   LABSPEC 1.012 01/14/2019 0226   PHURINE 7.0 01/14/2019 0226   GLUCOSEU NEGATIVE 01/14/2019 0226   HGBUR NEGATIVE 01/14/2019 0226   BILIRUBINUR NEGATIVE 01/14/2019 0226   KETONESUR NEGATIVE 01/14/2019 0226   PROTEINUR NEGATIVE 01/14/2019 0226   NITRITE NEGATIVE 01/14/2019 0226   LEUKOCYTESUR NEGATIVE 01/14/2019 0226      Imaging Results:    Dg Chest 2 View  Result Date: 07/10/2019 CLINICAL DATA:  Anemia EXAM: CHEST - 2 VIEW COMPARISON:  03/28/2019 FINDINGS: Cardiomegaly. No edema,  effusions. Bibasilar atelectasis. Prior multilevel vertebroplasty. No acute bony abnormality. IMPRESSION: Cardiomegaly.  Bibasilar atelectasis. Electronically Signed   By: Rolm Baptise M.D.   On: 07/10/2019 17:57    My personal review of EKG: Rhythm NSR, no ST-T changes   Assessment & Plan:    Active Problems:   Anemia   1. Anemia-likely iron deficiency from chronic blood loss.  Patient had EGD in 2018 which showed portal hypertensive gastropathy, gastric antral vascular ectasia, esophagitis.  Now patient presenting with anemia with hemoglobin 5.9, her baseline is around 7.3.  Likely from starting Eliquis in May 2020.  2 units PRBC have been ordered.  Follow CBC in a.m.  2. Guaiac positive stool-as above patient started on Eliquis in May 2020 for atrial fibrillation.  Might need another EGD.  We will keep her n.p.o. after midnight consult gastroenterology in a.m.  Hemodynamically patient is stable.  2 units PRBC have been ordered as above.  Start Protonix 40 mg IV every 12 hours.  3. Atrial fibrillation-heart rate is controlled, Eliquis on hold as above.  Continue bisoprolol  4. Chronic systolic CHF-currently euvolemic, continue Lasix 20 mg p.o. daily.   DVT Prophylaxis-   SCDs  AM Labs Ordered, also please review Full Orders  Family Communication: Admission, patients condition and plan of care including tests being ordered have been discussed with the patient who indicate understanding and agree with the plan and Code Status.  Code Status: DNR  Admission status: Observation: Based on patients clinical presentation and evaluation of above clinical data, I have made determination that patient will need less than 2 midnight stay in the hospital.  Time spent in minutes : 60 minutes   Oswald Hillock M.D on 07/10/2019 at 7:54 PM

## 2019-07-10 NOTE — ED Triage Notes (Signed)
Pt was sent here from [facility]. Pt's Hemoglobin is 6.5. Pt is weak and lethargic and pale. Pt is chronically anemic.

## 2019-07-11 ENCOUNTER — Encounter: Payer: Self-pay | Admitting: Gastroenterology

## 2019-07-11 DIAGNOSIS — D62 Acute posthemorrhagic anemia: Secondary | ICD-10-CM | POA: Diagnosis not present

## 2019-07-11 DIAGNOSIS — K922 Gastrointestinal hemorrhage, unspecified: Secondary | ICD-10-CM

## 2019-07-11 DIAGNOSIS — I482 Chronic atrial fibrillation, unspecified: Secondary | ICD-10-CM

## 2019-07-11 DIAGNOSIS — F329 Major depressive disorder, single episode, unspecified: Secondary | ICD-10-CM | POA: Diagnosis not present

## 2019-07-11 DIAGNOSIS — I5022 Chronic systolic (congestive) heart failure: Secondary | ICD-10-CM | POA: Diagnosis not present

## 2019-07-11 LAB — CBC
HCT: 30.5 % — ABNORMAL LOW (ref 36.0–46.0)
Hemoglobin: 8.8 g/dL — ABNORMAL LOW (ref 12.0–15.0)
MCH: 27.9 pg (ref 26.0–34.0)
MCHC: 28.9 g/dL — ABNORMAL LOW (ref 30.0–36.0)
MCV: 96.8 fL (ref 80.0–100.0)
Platelets: 239 10*3/uL (ref 150–400)
RBC: 3.15 MIL/uL — ABNORMAL LOW (ref 3.87–5.11)
RDW: 17.3 % — ABNORMAL HIGH (ref 11.5–15.5)
WBC: 6.1 10*3/uL (ref 4.0–10.5)
nRBC: 0 % (ref 0.0–0.2)

## 2019-07-11 LAB — COMPREHENSIVE METABOLIC PANEL
ALT: 11 U/L (ref 0–44)
AST: 13 U/L — ABNORMAL LOW (ref 15–41)
Albumin: 2.9 g/dL — ABNORMAL LOW (ref 3.5–5.0)
Alkaline Phosphatase: 99 U/L (ref 38–126)
Anion gap: 9 (ref 5–15)
BUN: 10 mg/dL (ref 8–23)
CO2: 29 mmol/L (ref 22–32)
Calcium: 8.4 mg/dL — ABNORMAL LOW (ref 8.9–10.3)
Chloride: 104 mmol/L (ref 98–111)
Creatinine, Ser: 0.85 mg/dL (ref 0.44–1.00)
GFR calc Af Amer: >60 mL/min (ref >60)
GFR calc non Af Amer: >60 mL/min (ref >60)
Glucose, Bld: 93 mg/dL (ref 70–99)
Potassium: 3.8 mmol/L (ref 3.5–5.1)
Sodium: 142 mmol/L (ref 135–145)
Total Bilirubin: 0.9 mg/dL (ref 0.3–1.2)
Total Protein: 5.5 g/dL — ABNORMAL LOW (ref 6.5–8.1)

## 2019-07-11 LAB — TYPE AND SCREEN
ABO/RH(D): O POS
Antibody Screen: NEGATIVE
Unit division: 0
Unit division: 0

## 2019-07-11 LAB — BPAM RBC
Blood Product Expiration Date: 202009142359
Blood Product Expiration Date: 202009232359
ISSUE DATE / TIME: 202008221918
ISSUE DATE / TIME: 202008222341
Unit Type and Rh: 5100
Unit Type and Rh: 5100

## 2019-07-11 MED ORDER — LORAZEPAM 2 MG/ML IJ SOLN
0.5000 mg | Freq: Once | INTRAMUSCULAR | Status: AC
  Administered 2019-07-11: 0.5 mg via INTRAVENOUS
  Filled 2019-07-11: qty 1

## 2019-07-11 MED ORDER — ALPRAZOLAM 0.5 MG PO TABS
0.5000 mg | ORAL_TABLET | Freq: Once | ORAL | Status: AC
  Administered 2019-07-11: 01:00:00 0.5 mg via ORAL
  Filled 2019-07-11: qty 1

## 2019-07-11 MED ORDER — PEG 3350-KCL-NA BICARB-NACL 420 G PO SOLR
4000.0000 mL | Freq: Once | ORAL | Status: AC
  Administered 2019-07-11: 4000 mL via ORAL

## 2019-07-11 MED ORDER — PANTOPRAZOLE SODIUM 40 MG PO TBEC
40.0000 mg | DELAYED_RELEASE_TABLET | Freq: Two times a day (BID) | ORAL | Status: DC
  Administered 2019-07-11 – 2019-07-13 (×4): 40 mg via ORAL
  Filled 2019-07-11 (×4): qty 1

## 2019-07-11 NOTE — Consult Note (Addendum)
Referring Provider: No ref. provider found Primary Care Physician:  Sonny Mastersakes, Linda M, FNP Primary Gastroenterologist:  DR. Karilyn CotaEHMAN  Reason for Consultation:  MACROCYTIC ANEMIA   Impression: ADMITTED WITH MACROCYTIC ANEMIA. NL B12/FOLATE IN FALL 2019. PRESENTS WITH OBSCURE GI BLEED/ANEMIA.  DIFFERENTIAL DIAGNOSIS INCLUDES: UNCONTROLLED CELIAC SPRUE, LESS LIKELY INTESTINAL LYMPHOMA, COLON POLYPS, AVMs, H PYLORI GASTRITIS, CAMERON'S EROSIONS,  ATROPHIC GASTRITIS, OR COLON CANCER.  Plan: 1. CLEAR LIQUID DIET. BOWEL PREP TODAY. NPO AFTER MN EXCEPT SIPS WITH MEDS. 2. CHECK TTG IgA IN AM. 3. TCS/EGD AUG 24. DISCUSSED WITH PT AND HUSBAND(HUBERT). 4. PROTONIX BID.      HPI:  Had routine check AND BLOOD WORK SHOED Hb 6 AND WAS SENT TO THE ED. HAD BEEN FEELING WEAK FOR A COUPLE OF WEEKS. BMs: EVERY DAY(NL, MUSHY). HAD MUSHY STOOLS OVER PAST WEEK:2-3Xs AND SOMETIMES SHE DOESN'T MAKE IT. WHEN SHE EATS MAY HAVE IT COMES BACK UP. HAS LOTS OF GAS WHEN SHE EATS. HEARTBURN: CONTROLLED WITH PROTONIX. LAST TCS > 5 YRS AGO. LAST EGD: 2018-FOLLOWING GLUTEN FREE SOMEWHAT. ANESTHESIA MAKES HER THROW UP AND SOMETIMES ANTIEMETIC HELPS AND SOMETIMES IT DOES NOT. LAST ELIQUIS FRI.  PT DENIES FEVER, CHILLS, HEMATOCHEZIA, HEMATEMESIS, nausea, vomiting, melena, diarrhea, CHEST PAIN, SHORTNESS OF BREATH, CHANGE IN BOWEL IN HABITS, constipation, abdominal pain, problems swallowing, problems with sedation, OR heartburn or indigestion.  Past Medical History:  Diagnosis Date  . Anxiety    takes Xanax daily  . Arthritis    back  . Atrial fibrillation with RVR (HCC) 03/25/2019  . Cataract   . Chronic back pain   . Constipation    OTC stool softener prn  . COPD (chronic obstructive pulmonary disease) (HCC)   . Depression   . Diverticulosis   . GERD (gastroesophageal reflux disease)    takes Ranidine daily  . Hemorrhoids   . History of bronchitis   . History of migraine    many yrs ago  . History of shingles   .  Hyperlipidemia   . Insomnia   . Migraines   . Osteoporosis   . PONV (postoperative nausea and vomiting)    Past Surgical History:  Procedure Laterality Date  . ABDOMINAL HYSTERECTOMY    . COLONOSCOPY    . COLONOSCOPY N/A 02/17/2015   Procedure: COLONOSCOPY;  Surgeon: Malissa HippoNajeeb U Rehman, MD;  Location: [facility];  Service: Endoscopy;  Laterality: N/A;  110n - moved to 11:15 - Ann to notify pt  . ESOPHAGOGASTRODUODENOSCOPY    . ESOPHAGOGASTRODUODENOSCOPY N/A 01/29/2016   Procedure: ESOPHAGOGASTRODUODENOSCOPY (EGD);  Surgeon: Malissa HippoNajeeb U Rehman, MD;  Location: [facility];  Service: Endoscopy;  Laterality: N/A;  . ESOPHAGOGASTRODUODENOSCOPY N/A 10/14/2017   Procedure: ESOPHAGOGASTRODUODENOSCOPY (EGD);  Surgeon: Malissa Hippoehman, Najeeb U, MD;  Location: [facility];  Service: Endoscopy;  Laterality: N/A;  730  . EYE SURGERY Right 3/15   cataracts  . INTRAMEDULLARY (IM) NAIL INTERTROCHANTERIC Left 01/07/2019   Procedure: INTRAMEDULLARY (IM) NAIL INTERTROCHANTRIC;  Surgeon: Vickki HearingHarrison, Stanley E, MD;  Location: [facility];  Service: Orthopedics;  Laterality: Left;  . KYPHOPLASTY N/A 07/08/2014   Procedure: Thoracic Eleven Kyphoplasty;  Surgeon: Hewitt Shortsobert W Nudelman, MD;  Location: [facility];  Service: Neurosurgery;  Laterality: N/A;  . left hip surgery    . LUMBAR LAMINECTOMY/DECOMPRESSION MICRODISCECTOMY Bilateral 05/20/2013   Procedure: LUMBAR LAMINECTOMY/DECOMPRESSION MICRODISCECTOMY 1 LEVEL;  Surgeon: Hewitt Shortsobert W Nudelman, MD;  Location: [facility];  Service: Neurosurgery;  Laterality: Bilateral;  Bilateral Lumbar four-five laminotomy and left lumbar four-five microdiskectomy  .  RIGHT/LEFT HEART CATH AND CORONARY ANGIOGRAPHY N/A 03/31/2019   Procedure: RIGHT/LEFT HEART CATH AND CORONARY ANGIOGRAPHY;  Surgeon: Tonny Bollmanooper, Michael, MD;  [facility];  Service: Cardiovascular;  Laterality: N/A;  . SPINE SURGERY     Dr Channing Muttersoy -  vertebraplasty   Prior to Admission medications   Medication Sig Start Date  End Date Taking? Authorizing Provider  acetaminophen (TYLENOL) 500 MG tablet Take 500 mg by mouth every 8 (eight) hours.  01/20/19  Yes [provider]  albuterol (PROVENTIL HFA;VENTOLIN HFA) 108 (90 Base) MCG/ACT inhaler Inhale 2 puffs into the lungs every 6 (six) hours as needed for wheezing or shortness of breath (cough). 01/28/19  Yes Sharee HolsterGreen, Deborah S, NP  ALPRAZolam Prudy Feeler(XANAX) 0.5 MG tablet Take 1 tablet (0.5 mg total) by mouth 2 (two) times daily as needed for anxiety. 07/24/19 08/23/19 Yes Rakes, Doralee AlbinoLinda M, FNP  apixaban (ELIQUIS) 5 MG TABS tablet Take 1 tablet (5 mg total) by mouth 2 (two) times daily. 04/07/19  Yes Rollene RotundaHochrein, James, MD  bisoprolol (ZEBETA) 5 MG tablet Take 1 tablet (5 mg total) by mouth daily. 04/07/19  Yes Rollene RotundaHochrein, James, MD  cholecalciferol (VITAMIN D) 1000 UNITS tablet Take 2,000-4,000 Units by mouth daily. Take 2000 units daily except take 4000 units on Saturday and Sunday.   Yes [provider]  escitalopram (LEXAPRO) 20 MG tablet TAKE ONE TABLET AT BEDTIME 06/24/19  Yes Rehman, Joline MaxcyNajeeb U, MD  ferrous sulfate (FEOSOL) 325 (65 FE) MG tablet Take 1 tablet (325 mg total) by mouth 3 (three) times daily with meals. 01/28/19  Yes Sharee HolsterGreen, Deborah S, NP  furosemide (LASIX) 20 MG tablet Take 1 tablet (20 mg total) by mouth daily. 04/07/19  Yes Rollene RotundaHochrein, James, MD  losartan (COZAAR) 25 MG tablet Take 0.5 tablets (12.5 mg total) by mouth daily. 04/28/19  Yes Rollene RotundaHochrein, James, MD  Multiple Vitamin (MULTIVITAMIN WITH MINERALS) TABS tablet Take 1 tablet by mouth daily. Centrum Silver   Yes [provider]  pantoprazole (PROTONIX) 40 MG tablet Take 1 tablet (40 mg total) by mouth 2 (two) timesdaily before a meal. 05/24/19  Yes Rehman, Joline MaxcyNajeeb U, MD  vitamin B-12 (CYANOCOBALAMIN) 1000 MCG tablet Take 1,000 mcg by mouth daily.   Yes [provider]  magic mouthwash w/lidocaine SOLN Take 5 mLs by mouth 3 (three) times daily as needed for mouth pain. Patient not taking:  Reported on 07/09/2019 03/23/19   Remus LofflerJones, Angel S, PA-C  potassium chloride SA (K-DUR) 20 MEQ tablet Take 1 tablet (20 mEq total) by mouth daily for 30 days. 04/19/19 07/09/19  Sonny Mastersakes, Linda M, FNP    Current Facility-Administered Medications  Medication Dose Route Frequency Provider Last Rate Last Dose  . 0.9 %  sodium chloride infusion   Intravenous Continuous Sharl MaLama, Sarina IllGagan S, MD      . acetaminophen (TYLENOL) tablet 650 mg  650 mg Oral Q6H PRN Meredeth IdeLama, Gagan S, MD       Or  . acetaminophen (TYLENOL) suppository 650 mg  650 mg Rectal Q6H PRN Sharl MaLama, Sarina IllGagan S, MD      . albuterol (PROVENTIL) (2.5 MG/3ML) 0.083% nebulizer solution 2.5 mg  2.5 mg Inhalation Q6H PRN Meredeth IdeLama, Gagan S, MD      . bisoprolol (ZEBETA) tablet 5 mg  5 mg Oral Daily Meredeth IdeLama, Gagan S, MD   Stopped at 07/11/19 [phone]  . escitalopram (LEXAPRO) tablet 20 mg  20 mg Oral QHS Sharl MaLama, Sarina IllGagan S, MD      . furosemide (LASIX) tablet  20 mg  20 mg Oral Daily Meredeth Ide, MD   Stopped at 07/11/19 [phone]  . ondansetron (ZOFRAN) tablet 4 mg  4 mg Oral Q6H PRN Meredeth Ide, MD       Or  . ondansetron (ZOFRAN) injection 4 mg  4 mg Intravenous Q6H PRN Meredeth Ide, MD      . pantoprazole (PROTONIX) injection 40 mg  40 mg Intravenous Q12H Meredeth Ide, MD   40 mg at 07/11/19 [id number]    Allergies as of 07/10/2019 - Review Complete 07/10/2019  Allergen Reaction Noted  . Codeine Nausea And Vomiting 04/08/2013  . Tramadol Nausea And Vomiting 10/07/2017  . Asa [aspirin] Other (See Comments) 04/08/2013  . Azithromycin Other (See Comments) 04/08/2013  . Celebrex [celecoxib] Other (See Comments) 04/08/2013  . Cymbalta [duloxetine hcl] Other (See Comments) 04/08/2013  . Prednisone Rash 03/25/2013  . Vioxx [rofecoxib] Other (See Comments) 04/08/2013  . Zelnorm [tegaserod] Other (See Comments) 04/08/2013  . Zocor [simvastatin] Other (See Comments) 04/08/2013    Family History  Problem Relation Age of Onset  . Hypertension Mother   . Heart attack Mother   .  Macular degeneration Father   . Heart disease Father   . Cirrhosis Son   . Heart disease Son   . Colon cancer Neg Hx      Social History   Socioeconomic History  . Marital status: Married    Spouse name: Velda Shell   . Number of children: Not on file  . Years of education: Not on file  . Highest education level: Not on file  Occupational History  . 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 Financial resource strain: Not on file  . Food insecurity    Worry: Not on file    Inability: Not on file  . Transportation needs    Medical: Not on file    Non-medical: Not on file  Tobacco Use  . Smoking status: Current Some Day Smoker    Packs/day: 1.00    Years: 52.00    Pack years: 52.00    Types: Cigarettes    Start date: 11/18/1958    Last attempt to quit: 08/20/2011    Years since quitting: 7.8  . Smokeless tobacco: Never Used  . Tobacco comment: 1/2-1pack off and on all her life  Substance and Sexual Activity  . Alcohol use: No    Alcohol/week: 0.0 standard drinks  . Drug use: No  . Sexual activity: Not Currently  Lifestyle  . Physical activity    Days per week: Not on file    Minutes per session: Not on file  . Stress: Not on file  Relationships  . Social Musician on phone: Not on file    Gets together: Not on file    Attends religious service: Not on file    Active member of club or organization: Not on file    Attends meetings of clubs or organizations: Not on file    Relationship status: Not on file  . Intimate partner violence    Fear of current or ex partner: Not on file    Emotionally abused: Not on file    Physically abused: Not on file    Forced sexual activity: Not on file  Other Topics Concern  . Not on file  Social History Narrative   Lives at home with husband, Velda Shell.   Had one  son- now deceased     Review of Systems: PER HPI OTHERWISE ALL SYSTEMS ARE NEGATIVE.   Vitals: Blood pressure (!)  154/77, pulse 78, temperature 98.1 F (36.7 C), temperature source Oral, resp. rate 18, height 5\' 7"  (1.702 m), weight 54.1 kg, SpO2 95 %.  Physical Exam: General:   Alert,  Well-developed, well-nourished, pleasant and cooperative in NAD Head:  Normocephalic and atraumatic. Eyes:  Sclera clear, no icterus.  Conjunctiva pink. Mouth:  No lesions, dentition ABnormal. Neck:  Supple; no masses. Lungs:  Clear throughout to auscultation.   No wheezes. No acute distress. Heart:  Regular rate and rhythm; no murmurs. Abdomen:  Soft, nontender and nondistended. No masses noted. Normal bowel sounds, without guarding, and without rebound.   Msk:  Symmetrical, . Extremities:  Without edema. Neurologic:  Alert and  oriented x4; NO  NEW FOCAL DEFICITS Cervical Nodes:  No significant cervical adenopathy. Psych:  Alert and cooperative. Normal mood and affect.   Lab Results: Recent Labs    07/09/19 1256 07/10/19 1421 07/11/19 0710  WBC 7.2 6.5 6.1  HGB 6.5* 5.9* 8.8*  HCT 22.9* 22.7* 30.5*  PLT 264 255 239   BMET Recent Labs    07/10/19 1421 07/11/19 0710  NA 141 142  K 3.6 3.8  CL 104 104  CO2 27 29  GLUCOSE 136* 93  BUN 13 10  CREATININE 0.90 0.85  CALCIUM 8.3* 8.4*   LFT Recent Labs    07/10/19 1421 07/11/19 0710  PROT 6.0* 5.5*  ALBUMIN 3.1* 2.9*  AST 13* 13*  ALT 11 11  ALKPHOS 106 99  BILITOT 0.3 0.9  BILIDIR <0.1  --   IBILI NOT CALCULATED  --      Studies/Results: AUG 22: EKG NSR, CXR-2 VIEW-CARDIOMEGALY, ATELECTASIS.  LOS: 0 days   Micah Barnier  07/11/2019, 10:20 AM

## 2019-07-11 NOTE — Progress Notes (Signed)
PROGRESS NOTE    Misty [name]  JXB:[id number] DOB: [birth year] DOA: 07/10/2019 PCP: Baruch Gouty, FNP    Brief Narrative:  78 y.o. female, with history of chronic anemia, atrial fibrillation on anticoagulation with Eliquis, GERD, hyperlipidemia, chronic systolic CHF came to hospital after she was found to have abnormal lab at PCP office.  As per PCP office her hemoglobin was 6.5.  She was told to go to ED for further evaluation.  Patient denies black stool, though she takes iron supplementation.  She denies chest pain or shortness of breath.  Denies dizziness or syncopal episode.  Patient was started on Eliquis in May of this year for atrial fibrillation. In the ED lab work showed hemoglobin 5.9.  2 units PRBC were ordered.  No previous history of stroke or seizures. Denies nausea vomiting or diarrhea Denies abdominal pain or dysuria Denies vomiting any blood No blood per rectum  FOBT was positive in the ED   Assessment & Plan: 1-acute blood loss anemia: Patient with underlying history of iron deficiency anemia in the past. -Hemoglobin on admission 5.6 -Status post 2 units PRBCs transfused with good response and currently hemoglobin 8.8 -GI service has been consulted and planning for endoscopy and colonoscopy on 07/12/2019. -Continue avoiding heparin drips or anticoagulants. -Follow hemoglobin trend. -Clear liquid diet with subsequent nothing by mouth except for sips with medications in the morning. -Continue PPI  2-chronic atrial fibrillation -Rate controlled -Continue bisoprolol -Holding anticoagulations in the setting of ABLA  3-chronic systolic heart failure -Compensated -Continue beta-blocker and current dose of Lasix -Follow daily weights and strict I's and O's -Patient denies shortness of breath and orthopnea.  4-depression -No suicidal ideation or hallucination -Continue Lexapro.  DVT prophylaxis: SCDs Code Status: DNR/DNI Family Communication: No family  at bedside. Disposition Plan: Remains in the hospital with anticipated procedure to further evaluate any source of active GI bleed.  Continue holding anticoagulants.  Continue to monitor hemoglobin trend.  Advance diet to clear liquid diet following GI recommendations and continue PPI.  Consultants:   GI service  Procedures:   Anticipated EGD and colonoscopy on 07/12/2019.  Antimicrobials:  Anti-infectives (From admission, onward)   None      Subjective: Afebrile, no chest pain, no shortness of breath.  Patient reports no nausea or vomiting and denies abdominal discomfort.  No signs of overt bleeding reported.  Objective: Vitals:   07/10/19 2129 07/11/19 0004 07/11/19 0025 07/11/19 0230  BP:  132/88 (!) 147/70 (!) 154/77  Pulse:  79 81 78  Resp:  18 18 18   Temp:  98.1 F (36.7 C) 98.4 F (36.9 C) 98.1 F (36.7 C)  TempSrc:  Oral Oral Oral  SpO2:  91% 94% 95%  Weight: 54.1 kg     Height: 5' 7"  (1.702 m)       Intake/Output Summary (Last 24 hours) at 07/11/2019 1350 Last data filed at 07/11/2019 1230 Gross per 24 hour  Intake 1370 ml  Output 1100 ml  Net 270 ml   Filed Weights   07/10/19 1410 07/10/19 2129  Weight: 53.5 kg 54.1 kg    Examination: General exam: Alert, awake, oriented x 3, no chest pain, reports no shortness of breath.  Expressed feeling weak and tired.  No nausea or vomiting. Respiratory system: Clear to auscultation. Respiratory effort normal. Cardiovascular system:Rate controlled. No rubs or gallops. Gastrointestinal system: Abdomen is nondistended, soft and nontender. No organomegaly or masses felt. Normal bowel sounds heard. Central nervous system: Alert and oriented. No  focal neurological deficits. Extremities: No cyanosis or clubbing; trace edema bilaterally. Skin: No rashes, lesions or ulcers Psychiatry: Judgement and insight appear normal. Mood & affect appropriate.    Data Reviewed: I have personally reviewed following labs and imaging  studies  CBC: Recent Labs  Lab 07/09/19 1256 07/10/19 1421 07/11/19 0710  WBC 7.2 6.5 6.1  NEUTROABS 5.3  --   --   HGB 6.5* 5.9* 8.8*  HCT 22.9* 22.7* 30.5*  MCV 93 105.1* 96.8  PLT 264 255 643   Basic Metabolic Panel: Recent Labs  Lab 07/09/19 1256 07/10/19 1421 07/11/19 0710  NA 143 141 142  K 4.2 3.6 3.8  CL 101 104 104  CO2 26 27 29   GLUCOSE 128* 136* 93  BUN 11 13 10   CREATININE 0.87 0.90 0.85  CALCIUM 8.7 8.3* 8.4*   GFR: Estimated Creatinine Clearance: 46.6 mL/min (by C-G formula based on SCr of 0.85 mg/dL).   Liver Function Tests: Recent Labs  Lab 07/09/19 1256 07/10/19 1421 07/11/19 0710  AST 15 13* 13*  ALT 9 11 11   ALKPHOS 129* 106 99  BILITOT <0.2 0.3 0.9  PROT 5.8* 6.0* 5.5*  ALBUMIN 3.6* 3.1* 2.9*   Lipid Profile: Recent Labs    07/09/19 1256  CHOL 125  HDL 45  LDLCALC 60  TRIG 101  CHOLHDL 2.8   Thyroid Function Tests: Recent Labs    07/09/19 1256  TSH 2.340  T4TOTAL 6.4   Urine analysis:    Component Value Date/Time   COLORURINE YELLOW 01/14/2019 0226   APPEARANCEUR CLEAR 01/14/2019 0226   LABSPEC 1.012 01/14/2019 0226   PHURINE 7.0 01/14/2019 0226   GLUCOSEU NEGATIVE 01/14/2019 0226   HGBUR NEGATIVE 01/14/2019 0226   BILIRUBINUR NEGATIVE 01/14/2019 0226   KETONESUR NEGATIVE 01/14/2019 0226   PROTEINUR NEGATIVE 01/14/2019 0226   NITRITE NEGATIVE 01/14/2019 0226   LEUKOCYTESUR NEGATIVE 01/14/2019 0226    Recent Results (from the past 240 hour(s))  SARS Coronavirus 2 [facility] order, Performed in [facility] hospital lab) Nasopharyngeal Nasopharyngeal Swab     Status: None   Collection Time: 07/10/19  7:36 PM   Specimen: Nasopharyngeal Swab  Result Value Ref Range Status   SARS Coronavirus 2 NEGATIVE NEGATIVE Final    Comment: (NOTE) If result is NEGATIVE SARS-CoV-2 target nucleic acids are NOT DETECTED. The SARS-CoV-2 RNA is generally detectable in upper and lower  respiratory specimens during the acute phase of  infection. The lowest  concentration of SARS-CoV-2 viral copies this assay can detect is 250  copies / mL. A negative result does not preclude SARS-CoV-2 infection  and should not be used as the sole basis for treatment or other  patient management decisions.  A negative result may occur with  improper specimen collection / handling, submission of specimen other  than nasopharyngeal swab, presence of viral mutation(s) within the  areas targeted by this assay, and inadequate number of viral copies  (<250 copies / mL). A negative result must be combined with clinical  observations, patient history, and epidemiological information. If result is POSITIVE SARS-CoV-2 target nucleic acids are DETECTED. The SARS-CoV-2 RNA is generally detectable in upper and lower  respiratory specimens dur ing the acute phase of infection.  Positive  results are indicative of active infection with SARS-CoV-2.  Clinical  correlation with patient history and other diagnostic information is  necessary to determine patient infection status.  Positive results do  not rule out bacterial infection or co-infection with other viruses. If result is  PRESUMPTIVE POSTIVE SARS-CoV-2 nucleic acids MAY BE PRESENT.   A presumptive positive result was obtained on the submitted specimen  and confirmed on repeat testing.  While 2019 novel coronavirus  (SARS-CoV-2) nucleic acids may be present in the submitted sample  additional confirmatory testing may be necessary for epidemiological  and / or clinical management purposes  to differentiate between  SARS-CoV-2 and other Sarbecovirus currently known to infect humans.  If clinically indicated additional testing with an alternate test  methodology [id number]) is advised. The SARS-CoV-2 RNA is generally  detectable in upper and lower respiratory sp ecimens during the acute  phase of infection. The expected result is Negative. Fact Sheet for Patients:   StrictlyIdeas.no Fact Sheet for Healthcare Providers: BankingDealers.co.za This test is not yet approved or cleared by the [place] FDA and has been authorized for detection and/or diagnosis of SARS-CoV-2 by FDA under an Emergency Use Authorization (EUA).  This EUA will remain in effect (meaning this test can be used) for the duration of the COVID-19 declaration under Section 564(b)(1) of the Act, 21 U.S.C. section 360bbb-3(b)(1), unless the authorization is terminated or revoked sooner. Performed at [facility], [address] [place], [place] [address]      Radiology Studies: Dg Chest 2 View  Result Date: 07/10/2019 CLINICAL DATA:  Anemia EXAM: CHEST - 2 VIEW COMPARISON:  03/28/2019 FINDINGS: Cardiomegaly. No edema, effusions. Bibasilar atelectasis. Prior multilevel vertebroplasty. No acute bony abnormality. IMPRESSION: Cardiomegaly.  Bibasilar atelectasis. Electronically Signed   By: Rolm Baptise M.D.   On: 07/10/2019 17:57    Scheduled Meds: . bisoprolol  5 mg Oral Daily  . escitalopram  20 mg Oral QHS  . furosemide  20 mg Oral Daily  . pantoprazole  40 mg Oral BID AC  . polyethylene glycol-electrolytes  4,000 mL Oral Once   Continuous Infusions: . sodium chloride       LOS: 0 days    Time spent: 30 minutes    Barton Dubois, MD [facility] Pager [phone]  If 7PM-7AM, please contact night-coverage [email or web address] Password [email or web address] 07/11/2019, 1:50 PM

## 2019-07-12 ENCOUNTER — Encounter (HOSPITAL_COMMUNITY)
Admission: EM | Disposition: A | Discharge status: Home / Self Care | Payer: Self-pay | Source: Home / Self Care | Attending: Internal Medicine

## 2019-07-12 ENCOUNTER — Other Ambulatory Visit: Payer: Self-pay

## 2019-07-12 DIAGNOSIS — F411 Generalized anxiety disorder: Secondary | ICD-10-CM | POA: Diagnosis present

## 2019-07-12 DIAGNOSIS — I5022 Chronic systolic (congestive) heart failure: Secondary | ICD-10-CM | POA: Diagnosis present

## 2019-07-12 DIAGNOSIS — K648 Other hemorrhoids: Secondary | ICD-10-CM | POA: Diagnosis present

## 2019-07-12 DIAGNOSIS — Z20828 Contact with and (suspected) exposure to other viral communicable diseases: Secondary | ICD-10-CM | POA: Diagnosis present

## 2019-07-12 DIAGNOSIS — E785 Hyperlipidemia, unspecified: Secondary | ICD-10-CM | POA: Diagnosis present

## 2019-07-12 DIAGNOSIS — K449 Diaphragmatic hernia without obstruction or gangrene: Secondary | ICD-10-CM | POA: Diagnosis present

## 2019-07-12 DIAGNOSIS — D509 Iron deficiency anemia, unspecified: Secondary | ICD-10-CM | POA: Diagnosis not present

## 2019-07-12 DIAGNOSIS — G8929 Other chronic pain: Secondary | ICD-10-CM | POA: Diagnosis present

## 2019-07-12 DIAGNOSIS — K900 Celiac disease: Secondary | ICD-10-CM | POA: Diagnosis present

## 2019-07-12 DIAGNOSIS — K3189 Other diseases of stomach and duodenum: Secondary | ICD-10-CM | POA: Diagnosis present

## 2019-07-12 DIAGNOSIS — K228 Other specified diseases of esophagus: Secondary | ICD-10-CM | POA: Diagnosis not present

## 2019-07-12 DIAGNOSIS — F17210 Nicotine dependence, cigarettes, uncomplicated: Secondary | ICD-10-CM | POA: Diagnosis present

## 2019-07-12 DIAGNOSIS — I482 Chronic atrial fibrillation, unspecified: Secondary | ICD-10-CM | POA: Diagnosis present

## 2019-07-12 DIAGNOSIS — K644 Residual hemorrhoidal skin tags: Secondary | ICD-10-CM | POA: Diagnosis present

## 2019-07-12 DIAGNOSIS — R195 Other fecal abnormalities: Secondary | ICD-10-CM | POA: Diagnosis not present

## 2019-07-12 DIAGNOSIS — F339 Major depressive disorder, recurrent, unspecified: Secondary | ICD-10-CM | POA: Diagnosis present

## 2019-07-12 DIAGNOSIS — E559 Vitamin D deficiency, unspecified: Secondary | ICD-10-CM | POA: Diagnosis present

## 2019-07-12 DIAGNOSIS — D62 Acute posthemorrhagic anemia: Secondary | ICD-10-CM | POA: Diagnosis present

## 2019-07-12 DIAGNOSIS — F418 Other specified anxiety disorders: Secondary | ICD-10-CM | POA: Diagnosis not present

## 2019-07-12 DIAGNOSIS — K922 Gastrointestinal hemorrhage, unspecified: Secondary | ICD-10-CM | POA: Diagnosis not present

## 2019-07-12 DIAGNOSIS — J449 Chronic obstructive pulmonary disease, unspecified: Secondary | ICD-10-CM | POA: Diagnosis present

## 2019-07-12 DIAGNOSIS — M810 Age-related osteoporosis without current pathological fracture: Secondary | ICD-10-CM | POA: Diagnosis present

## 2019-07-12 DIAGNOSIS — K5730 Diverticulosis of large intestine without perforation or abscess without bleeding: Secondary | ICD-10-CM | POA: Diagnosis present

## 2019-07-12 DIAGNOSIS — F329 Major depressive disorder, single episode, unspecified: Secondary | ICD-10-CM | POA: Diagnosis not present

## 2019-07-12 DIAGNOSIS — F419 Anxiety disorder, unspecified: Secondary | ICD-10-CM | POA: Diagnosis present

## 2019-07-12 DIAGNOSIS — K766 Portal hypertension: Secondary | ICD-10-CM | POA: Diagnosis present

## 2019-07-12 DIAGNOSIS — G4700 Insomnia, unspecified: Secondary | ICD-10-CM | POA: Diagnosis present

## 2019-07-12 DIAGNOSIS — K210 Gastro-esophageal reflux disease with esophagitis: Secondary | ICD-10-CM | POA: Diagnosis present

## 2019-07-12 DIAGNOSIS — Z66 Do not resuscitate: Secondary | ICD-10-CM | POA: Diagnosis present

## 2019-07-12 DIAGNOSIS — I700 Atherosclerosis of aorta: Secondary | ICD-10-CM | POA: Diagnosis present

## 2019-07-12 HISTORY — PX: ESOPHAGOGASTRODUODENOSCOPY: SHX5428

## 2019-07-12 HISTORY — PX: BIOPSY: SHX5522

## 2019-07-12 HISTORY — PX: COLONOSCOPY: SHX5424

## 2019-07-12 SURGERY — COLONOSCOPY
Anesthesia: Moderate Sedation

## 2019-07-12 MED ORDER — LIDOCAINE VISCOUS HCL 2 % MT SOLN
OROMUCOSAL | Status: AC
  Filled 2019-07-12: qty 15

## 2019-07-12 MED ORDER — MEPERIDINE HCL 50 MG/ML IJ SOLN
INTRAMUSCULAR | Status: AC
  Filled 2019-07-12: qty 1

## 2019-07-12 MED ORDER — MIDAZOLAM HCL 5 MG/5ML IJ SOLN
INTRAMUSCULAR | Status: AC
  Filled 2019-07-12: qty 10

## 2019-07-12 MED ORDER — MIDAZOLAM HCL 5 MG/5ML IJ SOLN
INTRAMUSCULAR | Status: DC | PRN
  Administered 2019-07-12 (×2): 1 mg via INTRAVENOUS
  Administered 2019-07-12 (×2): 2 mg via INTRAVENOUS
  Administered 2019-07-12: 1 mg via INTRAVENOUS
  Administered 2019-07-12: 2 mg via INTRAVENOUS
  Administered 2019-07-12: 1 mg via INTRAVENOUS

## 2019-07-12 MED ORDER — MEPERIDINE HCL 50 MG/ML IJ SOLN
INTRAMUSCULAR | Status: DC | PRN
  Administered 2019-07-12 (×2): 25 mg via INTRAVENOUS

## 2019-07-12 MED ORDER — SODIUM CHLORIDE 0.9 % IV SOLN
INTRAVENOUS | Status: DC
  Administered 2019-07-12: 13:00:00 via INTRAVENOUS

## 2019-07-12 MED ORDER — STERILE WATER FOR IRRIGATION IR SOLN
Status: DC | PRN
  Administered 2019-07-12: 13:00:00 2.5 mL

## 2019-07-12 MED ORDER — LIDOCAINE VISCOUS HCL 2 % MT SOLN
OROMUCOSAL | Status: DC | PRN
  Administered 2019-07-12: 4 mL via OROMUCOSAL

## 2019-07-12 NOTE — TOC Initial Note (Signed)
Transition of Care [facility]) - Initial/Assessment Note    Patient Details  Name: Misty [name] MRN: [id number] Date of Birth: August 16, 1941  Transition of [facility]) CM/SW Contact:    Ihor Gully, LCSW Phone Number: 07/12/2019, 5:37 PM  Clinical Narrative:                 Patient from home with spouse. At baseline, she ambulates with a walker. She has a cane in the home. She completed HH with [facility] previously and feels that she currently has no needs. Patient has no issues with transportation to PCP nor obtaining medications. Patient has oxygen in the home through [employer] but she does not use it. She has contacted the company to pick it up but they have not.  TOC will follow through discharge and assess as they arise.   Expected Discharge Plan: Home/Self Care Barriers to Discharge: No Barriers Identified   Patient Goals and CMS Choice Patient states their goals for this hospitalization and ongoing recovery are:: To return home to her baseline.      Expected Discharge Plan and Services Expected Discharge Plan: Home/Self Care       Living arrangements for the past 2 months: Single Family Home Expected Discharge Date: 07/11/19                                    Prior Living Arrangements/Services Living arrangements for the past 2 months: Single Family Home Lives with:: Spouse Patient language and need for interpreter reviewed:: Yes Do you feel safe going back to the place where you live?: Yes      Need for Family Participation in Patient Care: Yes (Comment) Care giver support system in place?: Yes (comment) Current home services: DME Criminal Activity/Legal Involvement Pertinent to Current Situation/Hospitalization: No - Comment as needed  Activities of Daily Living Home Assistive Devices/Equipment: Gilford Rile (specify type) ADL Screening (condition at time of admission) Patient's cognitive ability adequate to safely complete daily activities?: Yes Is the patient deaf or  have difficulty hearing?: No Does the patient have difficulty seeing, even when wearing glasses/contacts?: No Does the patient have difficulty concentrating, remembering, or making decisions?: Yes(occassionally) Patient able to express need for assistance with ADLs?: Yes Does the patient have difficulty dressing or bathing?: No Independently performs ADLs?: Yes (appropriate for developmental age) Does the patient have difficulty walking or climbing stairs?: Yes Weakness of Legs: Both Weakness of Arms/Hands: None  Permission Sought/Granted Permission sought to share information with : PCP                Emotional Assessment Appearance:: Appears stated age   Affect (typically observed): Accepting, Calm Orientation: : Oriented to Self, Oriented to Place, Oriented to  Time, Oriented to Situation Alcohol / Substance Use: Not Applicable Psych Involvement: No (comment)  Admission diagnosis:  Heme positive stool [R19.5] Symptomatic anemia [D64.9] Patient Active Problem List   Diagnosis Date Noted  . Anemia 07/10/2019  . Aortic atherosclerosis ([place]) 07/09/2019  . Vitamin D deficiency 07/09/2019  . GAD (generalized anxiety disorder) 07/09/2019  . Depression, recurrent ([place]) 07/09/2019  . Controlled substance agreement signed 07/09/2019  . Educated About Covid-19 Virus Infection 04/07/2019  . S/P thoracentesis   . Pleural effusion, right   . Acute systolic heart failure ([place]) 03/31/2019  . Lobar pneumonia ([place])   . Acute hypoxemic respiratory failure ([place])   . Community acquired pneumonia 03/26/2019  . Elevated  troponin 03/25/2019  . S/P IM nail left hip 01/07/2019 01/26/2019  . H/O kyphoplasty 01/14/2019  . Iron deficiency anemia 01/14/2019  . Acute blood loss anemia 01/13/2019  . Constipation due to opioid therapy 01/13/2019  . Anxiety and depression 01/13/2019  . Hypokalemia 01/10/2019  . Displaced intertrochanteric fracture of left femur, sequela 01/06/2019  . Chronic  midline low back pain without sciatica 10/09/2018  . Chronic midline thoracic back pain 10/09/2018  . Osteopenia determined by x-ray 04/16/2018  . Pulmonary fibrosis (HCC) 04/07/2018  . Non-intractable vomiting with nausea 10/06/2017  . Malnutrition of moderate degree 01/29/2016  . Generalized weakness 01/27/2016  . Abnormal chest x-ray 01/27/2016  . Tobacco use disorder 01/27/2016  . Colitis   . Hematochezia 02/26/2015  . Ischemic colitis (HCC) 02/26/2015  . Orthostatic hypotension 02/26/2015  . Nontraumatic compression fracture of T11 vertebra (HCC) 07/08/2014  . Hyperlipidemia 08/19/2013  . Fibrocystic breast disease 08/19/2013  . History of migraine headaches 08/19/2013  . GERD (gastroesophageal reflux disease) 04/07/2013  . Osteoporosis, postmenopausal 04/07/2013   PCP:  Sonny Masters, FNP Pharmacy:   [facility] [place] [address] [place] [address] Phone: [phone] Fax: [phone]  [facility] [place], [place] - [address] [place] [address] Phone: [phone] Fax: [phone]     Social Determinants of Health (SDOH) Interventions    Readmission Risk Interventions Readmission Risk Prevention Plan 04/02/2019 03/29/2019  Transportation Screening - Complete  PCP or Specialist Appt within 3-5 Days Complete -  HRI or Home Care Consult - Complete  Social Work Consult for Recovery Care Planning/Counseling - Complete  Palliative Care Screening - Not Applicable  Medication Review [employer]) - Complete  Some recent data might be hidden

## 2019-07-12 NOTE — Op Note (Signed)
[facility] Patient Name: Misty [name] Procedure Date: 07/12/2019 1:33 PM MRN: [id number] Date of Birth: 11-05-1941 Attending MD: Hildred Laser , MD CSN: [id number] Age: 78 Admit Type: Inpatient Procedure:                Colonoscopy Indications:              Heme positive stool, Iron deficiency anemia Providers:                Hildred Laser, MD, Gwenlyn Fudge RN, RN, Randa Spike, Technician Referring MD:             Barton Dubois, MD Medicines:                Midazolam 2 mg IV Complications:            No immediate complications. Estimated Blood Loss:     Estimated blood loss: none. Procedure:                Pre-Anesthesia Assessment:                           - Prior to the procedure, a History and Physical                            was performed, and patient medications and                            allergies were reviewed. The patient's tolerance of                            previous anesthesia was also reviewed. The risks                            and benefits of the procedure and the sedation                            options and risks were discussed with the patient.                            All questions were answered, and informed consent                            was obtained. Prior Anticoagulants: The patient                            last took Eliquis (apixaban) 2 days prior to the                            procedure. ASA Grade Assessment: III - A patient                            with severe systemic disease. After reviewing the  risks and benefits, the patient was deemed in                            satisfactory condition to undergo the procedure.                           After obtaining informed consent, the colonoscope                            was passed under direct vision. Throughout the                            procedure, the patient's blood pressure, pulse, and                             oxygen saturations were monitored continuously. The                            [id number]-[id number] ([id number]) scope was introduced through                            the anus and advanced to the the cecum, identified                            by appendiceal orifice and ileocecal valve. The                            colonoscopy was somewhat difficult due to a                            tortuous sigmoid colon. The patient tolerated the                            procedure well. The quality of the bowel                            preparation was adequate. The appendiceal orifice,                            and rectum were photographed. Scope In: 1:35:44 PM Scope Out: 2:04:32 PM Scope Withdrawal Time: 0 hours 8 minutes 58 seconds  Total Procedure Duration: 0 hours 28 minutes 48 seconds  Findings:      The perianal and digital rectal examinations were normal.      Scattered diverticula were found in the sigmoid colon and descending       colon.      The exam was otherwise normal throughout the examined colon.      External and internal hemorrhoids were found during retroflexion. The       hemorrhoids were medium-sized. Impression:               - Diverticulosis in the sigmoid colon and in the                            descending colon.                           -  External and internal hemorrhoids.                           - No specimens collected. Moderate Sedation:      Moderate (conscious) sedation was administered by the endoscopy nurse       and supervised by the endoscopist. The following parameters were       monitored: oxygen saturation, heart rate, blood pressure, CO2       capnography and response to care. Total physician intraservice time was       33 minutes. Recommendation:           - Return patient to hospital ward for ongoing care.                           - Gluten free diet today.                           - Continue present medications.                           - Small bowel  Given capsule study next week.                           - See other preocedure. Procedure Code(s):        --- Professional ---                           [phone], Colonoscopy, flexible; diagnostic, including                            collection of specimen(s) by brushing or washing,                            when performed (separate procedure)                           99153, Moderate sedation; each additional 15                            minutes intraservice time                           G0500, Moderate sedation services provided by the                            same physician or other qualified health care                            professional performing a gastrointestinal                            endoscopic service that sedation supports,                            requiring the presence of an independent trained  observer to assist in the monitoring of the                            patient's level of consciousness and physiological                            status; initial 15 minutes of intra-service time;                            patient age 64 years or older (additional time may                            be reported with [phone], as appropriate) Diagnosis Code(s):        --- Professional ---                           K64.8, Other hemorrhoids                           R19.5, Other fecal abnormalities                           D50.9, Iron deficiency anemia, unspecified                           K57.30, Diverticulosis of large intestine without                            perforation or abscess without bleeding CPT copyright 2019 American Medical Association. All rights reserved. The codes documented in this report are preliminary and upon coder review may  be revised to meet current compliance requirements. Hildred Laser, MD Hildred Laser, MD 07/12/2019 2:25:10 PM This report has been signed electronically. Number of Addenda: 0

## 2019-07-12 NOTE — Progress Notes (Signed)
PROGRESS NOTE    Misty [name]  PIR:[id number] DOB: 1941-11-02 DOA: 07/10/2019 PCP: Baruch Gouty, FNP    Brief Narrative:  78 y.o. female, with history of chronic anemia, atrial fibrillation on anticoagulation with Eliquis, GERD, hyperlipidemia, chronic systolic CHF came to hospital after she was found to have abnormal lab at PCP office.  As per PCP office her hemoglobin was 6.5.  She was told to go to ED for further evaluation.  Patient denies black stool, though she takes iron supplementation.  She denies chest pain or shortness of breath.  Denies dizziness or syncopal episode.  Patient was started on Eliquis in May of this year for atrial fibrillation. In the ED lab work showed hemoglobin 5.9.  2 units PRBC were ordered.  No previous history of stroke or seizures. Denies nausea vomiting or diarrhea Denies abdominal pain or dysuria Denies vomiting any blood No blood per rectum  FOBT was positive in the ED   Assessment & Plan: 1-acute blood loss anemia: Patient with underlying history of iron deficiency anemia in the past. -Hemoglobin on admission 5.6 -Status post 2 units PRBCs transfused with good response and latest hemoglobin above 8 range. -Appreciate GI service assistance and recommendations; plan is for endoscopy and colonoscopy later today (07/12/2019).   -Continue avoiding heparin products or anticoagulants. -Follow hemoglobin trend. -N.p.o. currently in anticipation for endoscopy and colonoscopy. -Continue PPI  2-chronic atrial fibrillation -Rate controlled -Continue bisoprolol -Holding anticoagulations in the setting of ABLA  3-chronic systolic heart failure -Compensated and euvolemic. -Continue beta-blocker and current dose of Lasix -Follow daily weights and strict I's and O's -Patient denies shortness of breath and orthopnea.  4-depression -No suicidal ideation or hallucination -Continue Lexapro.  DVT prophylaxis: SCDs Code Status: DNR/DNI Family  Communication: No family at bedside. Disposition Plan: Remains in the hospital with anticipated procedure to further evaluate any source of active GI bleed.  Continue holding anticoagulants.  Continue to monitor hemoglobin trend.  Advance diet to clear liquid diet following GI recommendations and continue PPI.  Consultants:   GI service  Procedures:   Anticipated EGD and colonoscopy on 07/12/2019.  Antimicrobials:  Anti-infectives (From admission, onward)   None      Subjective: No fever, no chest pain, no shortness of breath.  Patient reports no nausea or vomiting.  No overt bleeding appreciated in her stools.  Feeling tired after bowel preparation and ready to have endoscopy and colonoscopy later today.  Objective: Vitals:   07/12/19 1405 07/12/19 1410 07/12/19 1435 07/12/19 1452  BP: 122/62 126/76 (!) 148/77 (!) 151/91  Pulse: 71 72 75 70  Resp: 16 16 18 18   Temp:   98 F (36.7 C) (!) 97.5 F (36.4 C)  TempSrc:   Oral Oral  SpO2: 100% 100% 96% 100%  Weight:      Height:        Intake/Output Summary (Last 24 hours) at 07/12/2019 1455 Last data filed at 07/12/2019 1417 Gross per 24 hour  Intake 1480 ml  Output -  Net 1480 ml   Filed Weights   07/10/19 1410 07/10/19 2129  Weight: 53.5 kg 54.1 kg    Examination: General exam: Alert, awake, oriented x 3; no chest pain, no shortness of breath.  Patient reports feeling better and is less pale on examination.  No palpitations and reports no overt bleeding.  She is tired from bowel prep. Respiratory system: Clear to auscultation. Respiratory effort normal. Cardiovascular system:Rate controlled. No rubs, no gallops. Gastrointestinal system: Abdomen is  nondistended, soft and nontender. No organomegaly or masses felt. Normal bowel sounds heard. Central nervous system: Alert and oriented. No focal neurological deficits. Extremities: No C/C/E, +pedal pulses Skin: No rashes, lesions or ulcers Psychiatry: Judgement and insight  appear normal. Mood & affect appropriate.   Data Reviewed: I have personally reviewed following labs and imaging studies  CBC: Recent Labs  Lab 07/09/19 1256 07/10/19 1421 07/11/19 0710  WBC 7.2 6.5 6.1  NEUTROABS 5.3  --   --   HGB 6.5* 5.9* 8.8*  HCT 22.9* 22.7* 30.5*  MCV 93 105.1* 96.8  PLT 264 255 989   Basic Metabolic Panel: Recent Labs  Lab 07/09/19 1256 07/10/19 1421 07/11/19 0710  NA 143 141 142  K 4.2 3.6 3.8  CL 101 104 104  CO2 26 27 29   GLUCOSE 128* 136* 93  BUN 11 13 10   CREATININE 0.87 0.90 0.85  CALCIUM 8.7 8.3* 8.4*   GFR: Estimated Creatinine Clearance: 46.6 mL/min (by C-G formula based on SCr of 0.85 mg/dL).   Liver Function Tests: Recent Labs  Lab 07/09/19 1256 07/10/19 1421 07/11/19 0710  AST 15 13* 13*  ALT 9 11 11   ALKPHOS 129* 106 99  BILITOT <0.2 0.3 0.9  PROT 5.8* 6.0* 5.5*  ALBUMIN 3.6* 3.1* 2.9*   Urine analysis:    Component Value Date/Time   COLORURINE YELLOW 01/14/2019 0226   APPEARANCEUR CLEAR 01/14/2019 0226   LABSPEC 1.012 01/14/2019 0226   PHURINE 7.0 01/14/2019 0226   GLUCOSEU NEGATIVE 01/14/2019 0226   HGBUR NEGATIVE 01/14/2019 0226   BILIRUBINUR NEGATIVE 01/14/2019 0226   KETONESUR NEGATIVE 01/14/2019 0226   PROTEINUR NEGATIVE 01/14/2019 0226   NITRITE NEGATIVE 01/14/2019 0226   LEUKOCYTESUR NEGATIVE 01/14/2019 0226    Recent Results (from the past 240 hour(s))  SARS Coronavirus 2 [facility] order, Performed in [facility] hospital lab) Nasopharyngeal Nasopharyngeal Swab     Status: None   Collection Time: 07/10/19  7:36 PM   Specimen: Nasopharyngeal Swab  Result Value Ref Range Status   SARS Coronavirus 2 NEGATIVE NEGATIVE Final    Comment: (NOTE) If result is NEGATIVE SARS-CoV-2 target nucleic acids are NOT DETECTED. The SARS-CoV-2 RNA is generally detectable in upper and lower  respiratory specimens during the acute phase of infection. The lowest  concentration of SARS-CoV-2 viral copies this assay can  detect is 250  copies / mL. A negative result does not preclude SARS-CoV-2 infection  and should not be used as the sole basis for treatment or other  patient management decisions.  A negative result may occur with  improper specimen collection / handling, submission of specimen other  than nasopharyngeal swab, presence of viral mutation(s) within the  areas targeted by this assay, and inadequate number of viral copies  (<250 copies / mL). A negative result must be combined with clinical  observations, patient history, and epidemiological information. If result is POSITIVE SARS-CoV-2 target nucleic acids are DETECTED. The SARS-CoV-2 RNA is generally detectable in upper and lower  respiratory specimens dur ing the acute phase of infection.  Positive  results are indicative of active infection with SARS-CoV-2.  Clinical  correlation with patient history and other diagnostic information is  necessary to determine patient infection status.  Positive results do  not rule out bacterial infection or co-infection with other viruses. If result is PRESUMPTIVE POSTIVE SARS-CoV-2 nucleic acids MAY BE PRESENT.   A presumptive positive result was obtained on the submitted specimen  and confirmed on repeat testing.  While  2019 novel coronavirus  (SARS-CoV-2) nucleic acids may be present in the submitted sample  additional confirmatory testing may be necessary for epidemiological  and / or clinical management purposes  to differentiate between  SARS-CoV-2 and other Sarbecovirus currently known to infect humans.  If clinically indicated additional testing with an alternate test  methodology [phone]) is advised. The SARS-CoV-2 RNA is generally  detectable in upper and lower respiratory sp ecimens during the acute  phase of infection. The expected result is Negative. Fact Sheet for Patients:  StrictlyIdeas.no Fact Sheet for Healthcare Providers:  BankingDealers.co.za This test is not yet approved or cleared by the [place] FDA and has been authorized for detection and/or diagnosis of SARS-CoV-2 by FDA under an Emergency Use Authorization (EUA).  This EUA will remain in effect (meaning this test can be used) for the duration of the COVID-19 declaration under Section 564(b)(1) of the Act, 21 U.S.C. section 360bbb-3(b)(1), unless the authorization is terminated or revoked sooner. Performed at [facility], [address] [place], [place] [address]      Radiology Studies: Dg Chest 2 View  Result Date: 07/10/2019 CLINICAL DATA:  Anemia EXAM: CHEST - 2 VIEW COMPARISON:  03/28/2019 FINDINGS: Cardiomegaly. No edema, effusions. Bibasilar atelectasis. Prior multilevel vertebroplasty. No acute bony abnormality. IMPRESSION: Cardiomegaly.  Bibasilar atelectasis. Electronically Signed   By: Rolm Baptise M.D.   On: 07/10/2019 17:57    Scheduled Meds: . bisoprolol  5 mg Oral Daily  . escitalopram  20 mg Oral QHS  . furosemide  20 mg Oral Daily  . lidocaine      . meperidine      . midazolam      . pantoprazole  40 mg Oral BID AC   Continuous Infusions: . sodium chloride       LOS: 0 days    Time spent: 30 minutes    Barton Dubois, MD [facility] Pager [phone]  If 7PM-7AM, please contact night-coverage [email or web address] Password [facility] 07/12/2019, 2:55 PM

## 2019-07-12 NOTE — Op Note (Signed)
[facility] Patient Name: Misty [name] Procedure Date: 07/12/2019 1:05 PM MRN: [id number] Date of Birth: 09-21-1941 Attending MD: Hildred Laser , MD CSN: [id number] Age: 78 Admit Type: Inpatient Procedure:                Upper GI endoscopy Indications:              Iron deficiency anemia, Heme positive stool Providers:                Hildred Laser, MD, Otis Peak B. Sharon Seller, RN, Randa Spike, Technician Referring MD:             Barton Dubois, MD Medicines:                Lidocaine spray, Meperidine 50 mg IV, Midazolam 8                            mg IV Complications:            No immediate complications. Estimated Blood Loss:     Estimated blood loss was minimal. Procedure:                Pre-Anesthesia Assessment:                           - Prior to the procedure, a History and Physical                            was performed, and patient medications and                            allergies were reviewed. The patient's tolerance of                            previous anesthesia was also reviewed. The risks                            and benefits of the procedure and the sedation                            options and risks were discussed with the patient.                            All questions were answered, and informed consent                            was obtained. Prior Anticoagulants: The patient                            last took Eliquis (apixaban) 2 days prior to the                            procedure. ASA Grade Assessment: III - A patient  with severe systemic disease. After reviewing the                            risks and benefits, the patient was deemed in                            satisfactory condition to undergo the procedure.                           After obtaining informed consent, the endoscope was                            passed under direct vision. Throughout the   procedure, the patient's blood pressure, pulse, and                            oxygen saturations were monitored continuously. The                            [id number]-[id number] ([id number]) scope was introduced through the                            mouth, and advanced to the second part of duodenum.                            The upper GI endoscopy was accomplished without                            difficulty. The patient tolerated the procedure                            well. Scope In: 1:22:18 PM Scope Out: 1:31:01 PM Total Procedure Duration: 0 hours 8 minutes 43 seconds  Findings:      LA Grade A (one or more mucosal breaks less than 5 mm, not extending       between tops of 2 mucosal folds) esophagitis with no bleeding was found       34 to 36 cm from the incisors.      The Z-line was irregular and was found 36 cm from the incisors.      A 3 cm hiatal hernia was present.      Mild portal hypertensive gastropathy was found in the entire examined       stomach.      The exam of the stomach was otherwise normal.      Mildly scalloped mucosa was found in the duodenal bulb.      The second portion of the duodenum was normal. Biopsies were taken with       a cold forceps for histology. The pathology specimen was placed into       Bottle Number 1. Impression:               - LA Grade A reflux esophagitis. Esophagitis has                            improved since EGD of Nov,2018.                           -  Z-line irregular, 36 cm from the incisors.                           - 3 cm hiatal hernia.                           - Portal hypertensive gastropathy.                           - Scalloped mucosa was found in the duodenal bulb.                           - Normal second portion of the duodenum. Biopsied. Moderate Sedation:      Moderate (conscious) sedation was administered by the endoscopy nurse       and supervised by the endoscopist. The following parameters were       monitored: oxygen  saturation, heart rate, blood pressure, CO2       capnography and response to care. Total physician intraservice time was       18 minutes. Recommendation:           - Return patient to hospital ward for ongoing care.                           - Gluten free diet today.                           - Continue present medications.                           - No aspirin, ibuprofen, naproxen, or other                            non-steroidal anti-inflammatory drugs for 1 day                            after biopsy.                           - Await pathology results. Procedure Code(s):        --- Professional ---                           [phone], Esophagogastroduodenoscopy, flexible,                            transoral; with biopsy, single or multiple                           G0500, Moderate sedation services provided by the                            same physician or other qualified health care                            professional performing a gastrointestinal  endoscopic service that sedation supports,                            requiring the presence of an independent trained                            observer to assist in the monitoring of the                            patient's level of consciousness and physiological                            status; initial 15 minutes of intra-service time;                            patient age 74 years or older (additional time may                            be reported with [phone], as appropriate) Diagnosis Code(s):        --- Professional ---                           K21.0, Gastro-esophageal reflux disease with                            esophagitis                           K22.8, Other specified diseases of esophagus                           K44.9, Diaphragmatic hernia without obstruction or                            gangrene                           K76.6, Portal hypertension                           K31.89,  Other diseases of stomach and duodenum                           D50.9, Iron deficiency anemia, unspecified                           R19.5, Other fecal abnormalities CPT copyright 2019 American [employer] Association. All rights reserved. The codes documented in this report are preliminary and upon coder review may  be revised to meet current compliance requirements. Hildred Laser, MD Hildred Laser, MD 07/12/2019 2:18:18 PM This report has been signed electronically. Number of Addenda: 0

## 2019-07-13 ENCOUNTER — Encounter (HOSPITAL_COMMUNITY): Payer: Self-pay | Admitting: Internal Medicine

## 2019-07-13 ENCOUNTER — Other Ambulatory Visit (INDEPENDENT_AMBULATORY_CARE_PROVIDER_SITE_OTHER): Payer: Self-pay | Admitting: *Deleted

## 2019-07-13 DIAGNOSIS — D6489 Other specified anemias: Secondary | ICD-10-CM

## 2019-07-13 DIAGNOSIS — K210 Gastro-esophageal reflux disease with esophagitis: Secondary | ICD-10-CM

## 2019-07-13 DIAGNOSIS — F418 Other specified anxiety disorders: Secondary | ICD-10-CM

## 2019-07-13 DIAGNOSIS — D649 Anemia, unspecified: Secondary | ICD-10-CM

## 2019-07-13 LAB — CBC
HCT: 34.9 % — ABNORMAL LOW (ref 36.0–46.0)
Hemoglobin: 10 g/dL — ABNORMAL LOW (ref 12.0–15.0)
MCH: 27.5 pg (ref 26.0–34.0)
MCHC: 28.7 g/dL — ABNORMAL LOW (ref 30.0–36.0)
MCV: 96.1 fL (ref 80.0–100.0)
Platelets: 245 10*3/uL (ref 150–400)
RBC: 3.63 MIL/uL — ABNORMAL LOW (ref 3.87–5.11)
RDW: 16 % — ABNORMAL HIGH (ref 11.5–15.5)
WBC: 6 10*3/uL (ref 4.0–10.5)
nRBC: 0 % (ref 0.0–0.2)

## 2019-07-13 LAB — TISSUE TRANSGLUTAMINASE, IGA: Tissue Transglutaminase Ab, IgA: <2 U/mL (ref 0–3)

## 2019-07-13 LAB — TOXASSURE SELECT 13 (MW), URINE

## 2019-07-13 MED ORDER — ALPRAZOLAM 0.5 MG PO TABS
0.5000 mg | ORAL_TABLET | Freq: Once | ORAL | Status: AC
  Administered 2019-07-13: 10:00:00 0.5 mg via ORAL
  Filled 2019-07-13: qty 1

## 2019-07-13 MED ORDER — APIXABAN 5 MG PO TABS: 5.0000 mg | ORAL_TABLET | Freq: Two times a day (BID) | ORAL | Status: DC

## 2019-07-13 NOTE — Progress Notes (Signed)
  Subjective:  Patient states she has not been able to sleep all night.  She states she has difficulty sleeping in a hospital bed.  She complains of dry mouth.  She denies abdominal pain.  She has not had a bowel movement since colonoscopy yesterday.  Objective: Blood pressure (!) 149/70, pulse 70, temperature 97.9 F (36.6 C), temperature source Oral, resp. rate 16, height _0  (1.702 m), weight 54.1 kg, SpO2 95 %. Patient is alert and in no acute distress. Abdomen is symmetrical soft and nontender with organomegaly or masses.  Labs/studies Results:  CBC Latest Ref Rng & Units 07/13/2019 07/11/2019 07/10/2019  WBC 4.0 - 10.5 K/uL 6.0 6.1 6.5  Hemoglobin 12.0 - 15.0 g/dL 10.0(L) 8.8(L) 5.9(LL)  Hematocrit 36.0 - 46.0 % 34.9(L) 30.5(L) 22.7(L)  Platelets 150 - 400 K/uL 245 239 255    CMP Latest Ref Rng & Units 07/11/2019 07/10/2019 07/09/2019  Glucose 70 - 99 mg/dL 93 136(H) 128(H)  BUN 8 - 23 mg/dL _1 Creatinine 0.44 - 1.00 mg/dL 0.85 0.90 0.87  Sodium 135 - 145 mmol/L 142 141 143  Potassium 3.5 - 5.1 mmol/L 3.8 3.6 4.2  Chloride 98 - 111 mmol/L 104 104 101  CO2 22 - 32 mmol/L _2 Calcium 8.9 - 10.3 mg/dL 8.4(L) 8.3(L) 8.7  Total Protein 6.5 - 8.1 g/dL 5.5(L) 6.0(L) 5.8(L)  Total Bilirubin 0.3 - 1.2 mg/dL 0.9 0.3 <0.2  Alkaline Phos 38 - 126 U/L 99 106 129(H)  AST 15 - 41 U/L 13(L) 13(L) 15  ALT 0 - 44 U/L _3 Hepatic Function Latest Ref Rng & Units 07/11/2019 07/10/2019 07/09/2019  Total Protein 6.5 - 8.1 g/dL 5.5(L) 6.0(L) 5.8(L)  Albumin 3.5 - 5.0 g/dL 2.9(L) 3.1(L) 3.6(L)  AST 15 - 41 U/L 13(L) 13(L) 15  ALT 0 - 44 U/L _4 Alk Phosphatase 38 - 126 U/L 99 106 129(H)  Total Bilirubin 0.3 - 1.2 mg/dL 0.9 0.3 <0.2  Bilirubin, Direct 0.0 - 0.2 mg/dL - <0.1 -    Duodenal biopsy pending.  Assessment:  #1.  Iron deficiency anemia most likely secondary to chronic GI blood loss.  Patient has received 2 units of PRBCs.  Hemoglobin is up to a respectable level  of 10 g.  She underwent EGD and colonoscopy yesterday.  She had erosive reflux esophagitis small sliding hiatal hernia and left-sided diverticulosis as well as external and internal hemorrhoids but no definite bleeding lesion found.  She will return for outpatient small bowel given capsule study.  #2.  Celiac disease.  Patient needs to improve dietary compliance with gluten-free diet.  #3.  Chronic GERD.  Severity of esophagitis has improved significantly since last EGD of November 2018.  However she still has few distal esophageal erosions.  Recommendations:  No p.o. iron. Will arrange patient to have H&H and small bowel given capsule study week after next.   I will be contacting patient with biopsy results later this week. Anticoagulant can be resumed on 07/15/2019.

## 2019-07-13 NOTE — Progress Notes (Signed)
IV removed, 2x2 gauze and paper tape applied to site, patient tolerated well.  Reviewed AVS with patient who verbalized understanding. Patient to be taken to lobby via wheelchair and transported home by her husband.   

## 2019-07-13 NOTE — Discharge Summary (Addendum)
Physician Discharge Summary  KAELIN HOLFORD QPY:[id number] DOB: 1941-06-26 DOA: 07/10/2019  PCP: Baruch Gouty, FNP  Admit date: 07/10/2019 Discharge date: 07/13/2019  Time spent: 35 minutes  Recommendations for Outpatient Follow-up:  1. Repeat CBC to follow hemoglobin trend 2. -Repeat basic metabolic panel to follow electrolytes and renal function.   Discharge Diagnoses:  Acute blood loss anemia Iron deficiency anemia Chronic atrial fibrillation Chronic systolic heart failure Depression/anxiety GERD with reflux esophagitis  Discharge Condition: Stable and improved.  Patient discharged home with instruction to follow-up with PCP and gastroenterology service.  Diet recommendation: Heart healthy and gluten-free diet  Filed Weights   07/10/19 1410 07/10/19 2129  Weight: 53.5 kg 54.1 kg    History of present illness:  78 y.o.female,with history of chronic anemia, atrial fibrillation on anticoagulation with Eliquis, GERD, hyperlipidemia, chronic systolic CHF came to hospital after she was found to have abnormal lab at PCP office. As per PCP office her hemoglobin was 6.5. She was told to go to ED for further evaluation. Patient denies black stool, though she takes iron supplementation. She denies chest pain or shortness of breath. Denies dizziness or syncopal episode. Patient was started on Eliquis in May of this year for atrial fibrillation. In the ED lab work showed hemoglobin 5.9. 2 units PRBC were ordered.  No previous history of stroke or seizures. Denies nausea vomiting or diarrhea Denies abdominal pain or dysuria Denies vomiting any blood No frank blood per rectum Negative COVID  FOBT was positive in the ED  Hospital Course:  1-acute blood loss anemia: Patient with underlying history of iron deficiency anemia in the past. -Hemoglobin on admission 5.6 -Status post 2 units PRBCs transfused with good response and hemoglobin range at discharge of  10.0 -Appreciate GI service assistance and recommendations; patient is status post endoscopy and colonoscopy during this admission without active source of bleeding identified (see below for details on the studies). -Continue avoiding NSAIDs and recent anticoagulation on 07/15/2019 -Continue PPI -Repeat CBC at follow-up visit to reassess hemoglobin trend -Follow-up with GI as an outpatient for capsule endoscopy study  2-chronic atrial fibrillation -Rate controlled and is stable -Continue bisoprolol -Holding anticoagulations in the setting of ABLA; following GI recommendations will resume anticoagulation on 07/15/2019.  3-chronic systolic heart failure -Compensated and euvolemic. -Continue beta-blocker and current dose of Lasix -Follow daily weights and low-sodium diet -Patient denies shortness of breath and orthopnea. -Continue outpatient follow-up with cardiology service.  4-depression/anxiety -No suicidal ideation or hallucination -Continue Lexapro and as needed Xanax.  5-GERD -with reflux esophagitis findings appreciated on endoscopy procedure -Continue PPI  Procedures:  See below for x-ray reports  Endoscopy/colonoscopy: Demonstrating erosive reflux esophagitis, small sliding hiatal hernia and left-sided diverticulosis as well as internal and external hemorrhoids; no active bleeding appreciated.  Consultations:  Gastroenterology service  Discharge Exam: Vitals:   07/13/19 0536 07/13/19 1336  BP: (!) 149/70 130/76  Pulse: 70 77  Resp: 16   Temp: 97.9 F (36.6 C) 98.5 F (36.9 C)  SpO2: 95% 95%    General: Afebrile, no chest pain, no nausea, no vomiting, no shortness of breath.  Patient denies lightheadedness or dizziness.  Reports feeling slightly anxious and would like to go home. Cardiovascular: Rate controlled, no rubs, no gallops, no JVD on exam. Respiratory: Good air movement bilaterally, normal respiratory effort. Abdomen: Soft, nontender, nondistended,  positive bowel sounds. Extremities: No edema, no cyanosis, no clubbing.  Discharge Instructions   Discharge Instructions    (HEART FAILURE PATIENTS) Call MD:  Anytime you have any of the following symptoms: 1) 3 pound weight gain in 24 hours or 5 pounds in 1 week 2) shortness of breath, with or without a dry hacking cough 3) swelling in the hands, feet or stomach 4) if you have to sleep on extra pillows at night in order to breathe.   Complete by: As directed    Diet - low sodium heart healthy   Complete by: As directed    Discharge instructions   Complete by: As directed    Take medications as prescribed Follow-up with gastroenterology service as instructed Arrange follow-up with PCP in 10 days Maintain adequate hydration Make sure that you stick to heart healthy/gluten-free diet. Resume Eliquis on 07/15/19.     Allergies as of 07/13/2019      Reactions   Codeine Nausea And Vomiting   Tramadol Nausea And Vomiting   Asa [aspirin] Other (See Comments)   Patient is unaware of allergy   Azithromycin Other (See Comments)   Patient is unaware of allergy   Celebrex [celecoxib] Other (See Comments)   Irritated stomach   Cymbalta [duloxetine Hcl] Other (See Comments)   Felt groggy   Prednisone Rash   She has seen the podiatrist and he had injected cortisone in her feet and she had also taken a peel at home. She had a severe reaction to her face with a rash and had to take Benadryl to resolve the symptom. She actually did get more cortisone shots, but no additional reactions to the shots.   Vioxx [rofecoxib] Other (See Comments)   Unknown   Zelnorm [tegaserod] Other (See Comments)   Patient is unaware of allergy   Zocor [simvastatin] Other (See Comments)   Patient is unaware of allergy      Medication List    STOP taking these medications   ferrous sulfate 325 (65 FE) MG tablet Commonly known as: Feosol   magic mouthwash w/lidocaine Soln     TAKE these medications    acetaminophen 500 MG tablet Commonly known as: TYLENOL Take 500 mg by mouth every 8 (eight) hours.   albuterol 108 (90 Base) MCG/ACT inhaler Commonly known as: VENTOLIN HFA Inhale 2 puffs into the lungs every 6 (six) hours as needed for wheezing or shortness of breath (cough).   ALPRAZolam 0.5 MG tablet Commonly known as: XANAX Take 1 tablet (0.5 mg total) by mouth 2 (two) times daily as needed for anxiety. Start taking on: July 24, 2019   apixaban 5 MG Tabs tablet Commonly known as: ELIQUIS Take 1 tablet (5 mg total) by mouth 2 (two) times daily. Start taking on: July 15, 2019 What changed: These instructions start on July 15, 2019. If you are unsure what to do until then, ask your doctor or other care provider.   bisoprolol 5 MG tablet Commonly known as: ZEBETA Take 1 tablet (5 mg total) by mouth daily.   cholecalciferol 1000 units tablet Commonly known as: VITAMIN D Take 2,000-4,000 Units by mouth daily. Take 2000 units daily except take 4000 units on Saturday and Sunday.   escitalopram 20 MG tablet Commonly known as: LEXAPRO TAKE ONE TABLET AT BEDTIME   furosemide 20 MG tablet Commonly known as: LASIX Take 1 tablet (20 mg total) by mouth daily.   losartan 25 MG tablet Commonly known as: COZAAR Take 0.5 tablets (12.5 mg total) by mouth daily.   multivitamin with minerals Tabs tablet Take 1 tablet by mouth daily. Centrum Silver   pantoprazole 40 MG tablet  Commonly known as: PROTONIX Take 1 tablet (40 mg total) by mouth 2 (two) timesdaily before a meal.   potassium chloride SA 20 MEQ tablet Commonly known as: K-DUR Take 1 tablet (20 mEq total) by mouth daily for 30 days.   vitamin B-12 1000 MCG tablet Commonly known as: CYANOCOBALAMIN Take 1,000 mcg by mouth daily.      Allergies  Allergen Reactions  . Codeine Nausea And Vomiting  . Tramadol Nausea And Vomiting  . Asa [Aspirin] Other (See Comments)    Patient is unaware of allergy  .  Azithromycin Other (See Comments)    Patient is unaware of allergy  . Celebrex [Celecoxib] Other (See Comments)    Irritated stomach   . Cymbalta [Duloxetine Hcl] Other (See Comments)    Felt groggy  . Prednisone Rash    She has seen the podiatrist and he had injected cortisone in her feet and she had also taken a peel at home. She had a severe reaction to her face with a rash and had to take Benadryl to resolve the symptom. She actually did get more cortisone shots, but no additional reactions to the shots.  . Vioxx [Rofecoxib] Other (See Comments)    Unknown  . Zelnorm [Tegaserod] Other (See Comments)    Patient is unaware of allergy  . Zocor [Simvastatin] Other (See Comments)    Patient is unaware of allergy   Follow-up Information    Baruch Gouty, FNP Follow up on 07/20/2019.   Specialty: Family Medicine Why: Tuesday, July 20, 2019, @ 11:00 a.m. Contact information: [place] [address] [phone]        Minus Breeding, MD .   Specialty: Cardiology Contact information: [address] [place] [address] [phone]           The results of significant diagnostics from this hospitalization (including imaging, microbiology, ancillary and laboratory) are listed below for reference.    Significant Diagnostic Studies: Dg Chest 2 View  Result Date: 07/10/2019 CLINICAL DATA:  Anemia EXAM: CHEST - 2 VIEW COMPARISON:  03/28/2019 FINDINGS: Cardiomegaly. No edema, effusions. Bibasilar atelectasis. Prior multilevel vertebroplasty. No acute bony abnormality. IMPRESSION: Cardiomegaly.  Bibasilar atelectasis. Electronically Signed   By: Rolm Baptise M.D.   On: 07/10/2019 17:57    Microbiology: Recent Results (from the past 240 hour(s))  SARS Coronavirus 2 [facility] order, Performed in [facility] hospital lab) Nasopharyngeal Nasopharyngeal Swab     Status: None   Collection Time: 07/10/19  7:36 PM   Specimen: Nasopharyngeal Swab  Result  Value Ref Range Status   SARS Coronavirus 2 NEGATIVE NEGATIVE Final    Comment: (NOTE) If result is NEGATIVE SARS-CoV-2 target nucleic acids are NOT DETECTED. The SARS-CoV-2 RNA is generally detectable in upper and lower  respiratory specimens during the acute phase of infection. The lowest  concentration of SARS-CoV-2 viral copies this assay can detect is 250  copies / mL. A negative result does not preclude SARS-CoV-2 infection  and should not be used as the sole basis for treatment or other  patient management decisions.  A negative result may occur with  improper specimen collection / handling, submission of specimen other  than nasopharyngeal swab, presence of viral mutation(s) within the  areas targeted by this assay, and inadequate number of viral copies  (<250 copies / mL). A negative result must be combined with clinical  observations, patient history, and epidemiological information. If result is POSITIVE SARS-CoV-2 target nucleic acids are DETECTED.  The SARS-CoV-2 RNA is generally detectable in upper and lower  respiratory specimens dur ing the acute phase of infection.  Positive  results are indicative of active infection with SARS-CoV-2.  Clinical  correlation with patient history and other diagnostic information is  necessary to determine patient infection status.  Positive results do  not rule out bacterial infection or co-infection with other viruses. If result is PRESUMPTIVE POSTIVE SARS-CoV-2 nucleic acids MAY BE PRESENT.   A presumptive positive result was obtained on the submitted specimen  and confirmed on repeat testing.  While 2019 novel coronavirus  (SARS-CoV-2) nucleic acids may be present in the submitted sample  additional confirmatory testing may be necessary for epidemiological  and / or clinical management purposes  to differentiate between  SARS-CoV-2 and other Sarbecovirus currently known to infect humans.  If clinically indicated additional testing  with an alternate test  methodology [phone]) is advised. The SARS-CoV-2 RNA is generally  detectable in upper and lower respiratory sp ecimens during the acute  phase of infection. The expected result is Negative. Fact Sheet for Patients:  StrictlyIdeas.no Fact Sheet for Healthcare Providers: BankingDealers.co.za This test is not yet approved or cleared by the [place] FDA and has been authorized for detection and/or diagnosis of SARS-CoV-2 by FDA under an Emergency Use Authorization (EUA).  This EUA will remain in effect (meaning this test can be used) for the duration of the COVID-19 declaration under Section 564(b)(1) of the Act, 21 U.S.C. section 360bbb-3(b)(1), unless the authorization is terminated or revoked sooner. Performed at [facility], [address] [place], [place] [address]      Labs: Basic Metabolic Panel: Recent Labs  Lab 07/09/19 1256 07/10/19 1421 07/11/19 0710  NA 143 141 142  K 4.2 3.6 3.8  CL 101 104 104  CO2 26 27 29   GLUCOSE 128* 136* 93  BUN 11 13 10   CREATININE 0.87 0.90 0.85  CALCIUM 8.7 8.3* 8.4*   Liver Function Tests: Recent Labs  Lab 07/09/19 1256 07/10/19 1421 07/11/19 0710  AST 15 13* 13*  ALT 9 11 11   ALKPHOS 129* 106 99  BILITOT <0.2 0.3 0.9  PROT 5.8* 6.0* 5.5*  ALBUMIN 3.6* 3.1* 2.9*   CBC: Recent Labs  Lab 07/09/19 1256 07/10/19 1421 07/11/19 0710 07/13/19 0525  WBC 7.2 6.5 6.1 6.0  NEUTROABS 5.3  --   --   --   HGB 6.5* 5.9* 8.8* 10.0*  HCT 22.9* 22.7* 30.5* 34.9*  MCV 93 105.1* 96.8 96.1  PLT 264 255 239 245   BNP (last 3 results) Recent Labs    03/25/19 2033 07/10/19 1421  BNP >4,500.0* 365.0*    Signed:  Barton Dubois MD.  [facility] 07/13/2019, 4:11 PM

## 2019-07-19 ENCOUNTER — Other Ambulatory Visit: Payer: Self-pay

## 2019-07-20 ENCOUNTER — Encounter: Payer: Self-pay | Admitting: Family Medicine

## 2019-07-20 ENCOUNTER — Ambulatory Visit (INDEPENDENT_AMBULATORY_CARE_PROVIDER_SITE_OTHER): Payer: Medicare Other | Admitting: Family Medicine

## 2019-07-20 VITALS — BP 123/71 | HR 74 | Temp 96.9°F | Ht 67.0 in | Wt 115.0 lb

## 2019-07-20 DIAGNOSIS — K2100 Gastro-esophageal reflux disease with esophagitis, without bleeding: Secondary | ICD-10-CM | POA: Insufficient documentation

## 2019-07-20 DIAGNOSIS — Z09 Encounter for follow-up examination after completed treatment for conditions other than malignant neoplasm: Secondary | ICD-10-CM | POA: Diagnosis not present

## 2019-07-20 DIAGNOSIS — R6889 Other general symptoms and signs: Secondary | ICD-10-CM | POA: Diagnosis not present

## 2019-07-20 DIAGNOSIS — I480 Paroxysmal atrial fibrillation: Secondary | ICD-10-CM | POA: Diagnosis not present

## 2019-07-20 DIAGNOSIS — D500 Iron deficiency anemia secondary to blood loss (chronic): Secondary | ICD-10-CM

## 2019-07-20 NOTE — Patient Instructions (Addendum)
Anemia  Anemia is a condition in which you do not have enough red blood cells or hemoglobin. Hemoglobin is a substance in red blood cells that carries oxygen. When you do not have enough red blood cells or hemoglobin (are anemic), your body cannot get enough oxygen and your organs may not work properly. As a result, you may feel very tired or have other problems. What are the causes? Common causes of anemia include:  Excessive bleeding. Anemia can be caused by excessive bleeding inside or outside the body, including bleeding from the intestine or from periods in women.  Poor nutrition.  Long-lasting (chronic) kidney, thyroid, and liver disease.  Bone marrow disorders.  Cancer and treatments for cancer.  HIV (human immunodeficiency virus) and AIDS (acquired immunodeficiency syndrome).  Treatments for HIV and AIDS.  Spleen problems.  Blood disorders.  Infections, medicines, and autoimmune disorders that destroy red blood cells. What are the signs or symptoms? Symptoms of this condition include:  Minor weakness.  Dizziness.  Headache.  Feeling heartbeats that are irregular or faster than normal (palpitations).  Shortness of breath, especially with exercise.  Paleness.  Cold sensitivity.  Indigestion.  Nausea.  Difficulty sleeping.  Difficulty concentrating. Symptoms may occur suddenly or develop slowly. If your anemia is mild, you may not have symptoms. How is this diagnosed? This condition is diagnosed based on:  Blood tests.  Your medical history.  A physical exam.  Bone marrow biopsy. Your health care provider may also check your stool (feces) for blood and may do additional testing to look for the cause of your bleeding. You may also have other tests, including:  Imaging tests, such as a CT scan or MRI.  Endoscopy.  Colonoscopy. How is this treated? Treatment for this condition depends on the cause. If you continue to lose a lot of blood, you may  need to be treated at a hospital. Treatment may include:  Taking supplements of iron, vitamin S31, or folic acid.  Taking a hormone medicine (erythropoietin) that can help to stimulate red blood cell growth.  Having a blood transfusion. This may be needed if you lose a lot of blood.  Making changes to your diet.  Having surgery to remove your spleen. Follow these instructions at home:  Take over-the-counter and prescription medicines only as told by your health care provider.  Take supplements only as told by your health care provider.  Follow any diet instructions that you were given.  Keep all follow-up visits as told by your health care provider. This is important. Contact a health care provider if:  You develop new bleeding anywhere in the body. Get help right away if:  You are very weak.  You are short of breath.  You have pain in your abdomen or chest.  You are dizzy or feel faint.  You have trouble concentrating.  You have bloody or black, tarry stools.  You vomit repeatedly or you vomit up blood. Summary  Anemia is a condition in which you do not have enough red blood cells or enough of a substance in your red blood cells that carries oxygen (hemoglobin).  Symptoms may occur suddenly or develop slowly.  If your anemia is mild, you may not have symptoms.  This condition is diagnosed with blood tests as well as a medical history and physical exam. Other tests may be needed.  Treatment for this condition depends on the cause of the anemia. This information is not intended to replace advice given to you by  your health care provider. Make sure you discuss any questions you have with your health care provider. Document Released: 12/12/2004 Document Revised: 10/17/2017 Document Reviewed: 12/06/2016 [employer] Patient Education  2020 [employer]. anem

## 2019-07-20 NOTE — Progress Notes (Signed)
Subjective:  Patient ID: Misty [name], female    DOB: Oct 22, 1941, 78 y.o.   MRN: [id number]  Patient Care Team: Baruch Gouty, FNP as PCP - General (Family Medicine) Minus Breeding, MD as PCP - Cardiology (Cardiology) Rogene Houston, MD as Consulting Physician (Gastroenterology) Glenna Fellows, MD as Attending Physician (Neurosurgery) Alanda Slim Neena Rhymes, MD as Consulting Physician (Ophthalmology) Ilean China, RN as Registered Nurse   Chief Complaint:  Hospitalization Follow-up [facility] - decreased HBG )   HPI: Misty [name] is a 78 y.o. female presenting on 07/20/2019 for Hospitalization Follow-up [facility] - decreased HBG )  1. Hospital discharge follow-up  The patient was discharged from [facility] on 07/13/2019 with a primary diagnosis of acute blood loss anemia, IDA, chronic a-fib, chronic systolic heart failure, depression, anxiety, and GERD with esophagitis.  Through chart review and discussion with the patient I have determined that management of their condition is of high complexity.    2. Iron deficiency anemia due to chronic blood loss  Pt reports she feels like she is regaining her strength. States she is still weak but improving. Hgb 10 and Hct 34.9 upon discharge from hospital. Pt denies rectal bleeding, hematuria, or abnormal bleeding or bruising.    3. Hypocalcemia  Ca 8.4 in hospital. Pt denies myalgias, spasms, chest pain, paresthesias, or irritability.    4. Gastroesophageal reflux disease with esophagitis  Compliant with medications. No cough, hemoptysis, voice change, or dysphagia. Scheduled to follow up with GI for further testing.    5. Paroxysmal atrial fibrillation (Braxton)  Followed by cardiology. Pt has resumed Eliquis. She denies palpitations, increased shortness of breath, syncope, or dizziness.      Relevant past medical, surgical, family, and social history reviewed and updated as indicated.  Allergies and medications reviewed and  updated. Date reviewed: Chart in Epic.   Past Medical History:  Diagnosis Date   Anxiety    takes Xanax daily   Arthritis    back   Atrial fibrillation with RVR (HCC) 03/25/2019   Cataract    Chronic back pain    Constipation    OTC stool softener prn   COPD (chronic obstructive pulmonary disease) (HCC)    Depression    Diverticulosis    GERD (gastroesophageal reflux disease)    takes Ranidine daily   Hemorrhoids    History of bronchitis    History of migraine    many yrs ago   History of shingles    Hyperlipidemia    Insomnia    Migraines    Osteoporosis    PONV (postoperative nausea and vomiting)     Past Surgical History:  Procedure Laterality Date   ABDOMINAL HYSTERECTOMY     BIOPSY  07/12/2019   Procedure: BIOPSY;  Surgeon: Rogene Houston, MD;  [facility];  Service: Endoscopy;;  duodenum   COLONOSCOPY     COLONOSCOPY N/A 02/17/2015   Procedure: COLONOSCOPY;  Surgeon: Rogene Houston, MD;  [facility];  Service: Endoscopy;  Laterality: N/A;  110n - moved to 11:15 - Ann to notify pt   COLONOSCOPY N/A 07/12/2019   Procedure: COLONOSCOPY;  Surgeon: Rogene Houston, MD;  [facility];  Service: Endoscopy;  Laterality: N/A;   ESOPHAGOGASTRODUODENOSCOPY     ESOPHAGOGASTRODUODENOSCOPY N/A 01/29/2016   Procedure: ESOPHAGOGASTRODUODENOSCOPY (EGD);  Surgeon: Rogene Houston, MD;  [facility];  Service: Endoscopy;  Laterality: N/A;   ESOPHAGOGASTRODUODENOSCOPY N/A  °  ° °Subjective:  °Patient ID: Misty [name], female    DOB: 02/11/1941, 78 y.o.   MRN: [id number] ° °Patient Care Team: °,  M, FNP as PCP - General (Family Medicine) °Hochrein, James, MD as PCP - Cardiology (Cardiology) °Rehman, Najeeb U, MD as Consulting Physician (Gastroenterology) °Roy, Mark, MD as Attending Physician (Neurosurgery) °Mincey, Andrew Julian, MD as Consulting Physician (Ophthalmology) °Hudy, Kristen N, RN as Registered Nurse  ° °Chief Complaint:  Hospitalization Follow-up ([facility] - decreased HBG ) ° ° °HPI: °Misty [name] is a 78 y.o. female presenting on 07/20/2019 for Hospitalization Follow-up ([place] - decreased HBG ) ° °1. Hospital discharge follow-up  °The patient was discharged from [facility] on 07/13/2019 with a primary diagnosis of acute blood loss anemia, IDA, chronic a-fib, chronic systolic heart failure, depression, anxiety, and GERD with esophagitis. ° °Through chart review and discussion with the patient I have determined that management of their condition is of high complexity.  °  °2. Iron deficiency anemia due to chronic blood loss  °Pt reports she feels like she is regaining her strength. States she is still weak but improving. Hgb 10 and Hct 34.9 upon discharge from hospital. Pt denies rectal bleeding, hematuria, or abnormal bleeding or bruising.  °  °3. Hypocalcemia  °Ca 8.4 in hospital. Pt denies myalgias, spasms, chest pain, paresthesias, or irritability.  °  °4. Gastroesophageal reflux disease with esophagitis  °Compliant with medications. No cough, hemoptysis, voice change, or dysphagia. Scheduled to follow up with GI for further testing.  °  °5. Paroxysmal atrial fibrillation (HCC)  °Followed by cardiology. Pt has resumed Eliquis. She denies palpitations, increased shortness of breath, syncope, or dizziness.   ° ° ° °Relevant past medical, surgical, family, and social history reviewed and updated as indicated.  °Allergies and medications reviewed and  updated. Date reviewed: Chart in Epic. ° ° °Past Medical History:  °Diagnosis Date  °• Anxiety   ° takes Xanax daily  °• Arthritis   ° back  °• Atrial fibrillation with RVR (HCC) 03/25/2019  °• Cataract   °• Chronic back pain   °• Constipation   ° OTC stool softener prn  °• COPD (chronic obstructive pulmonary disease) (HCC)   °• Depression   °• Diverticulosis   °• GERD (gastroesophageal reflux disease)   ° takes Ranidine daily  °• Hemorrhoids   °• History of bronchitis   °• History of migraine   ° many yrs ago  °• History of shingles   °• Hyperlipidemia   °• Insomnia   °• Migraines   °• Osteoporosis   °• PONV (postoperative nausea and vomiting)   ° ° °Past Surgical History:  °Procedure Laterality Date  °• ABDOMINAL HYSTERECTOMY    °• BIOPSY  07/12/2019  ° Procedure: BIOPSY;  Surgeon: Rehman, Najeeb U, MD;  [facility];  Service: Endoscopy;;  duodenum  °• COLONOSCOPY    °• COLONOSCOPY N/A 02/17/2015  ° Procedure: COLONOSCOPY;  Surgeon: Najeeb U Rehman, MD;  [facility];  Service: Endoscopy;  Laterality: N/A;  110n - moved to 11:15 - Ann to notify pt  °• COLONOSCOPY N/A 07/12/2019  ° Procedure: COLONOSCOPY;  Surgeon: Rehman, Najeeb U, MD;  [facility];  Service: Endoscopy;  Laterality: N/A;  °• ESOPHAGOGASTRODUODENOSCOPY    °• ESOPHAGOGASTRODUODENOSCOPY N/A 01/29/2016  ° Procedure: ESOPHAGOGASTRODUODENOSCOPY (EGD);  Surgeon: Najeeb U Rehman, MD;  [facility];  Service: Endoscopy;  Laterality: N/A;  °• ESOPHAGOGASTRODUODENOSCOPY N/A 10/14/2017  °   °  ° °Subjective:  °Patient ID: Misty [name], female    DOB: 02/11/1941, 78 y.o.   MRN: [id number] ° °Patient Care Team: °,  M, FNP as PCP - General (Family Medicine) °Hochrein, James, MD as PCP - Cardiology (Cardiology) °Rehman, Najeeb U, MD as Consulting Physician (Gastroenterology) °Roy, Mark, MD as Attending Physician (Neurosurgery) °Mincey, Andrew Julian, MD as Consulting Physician (Ophthalmology) °Hudy, Kristen N, RN as Registered Nurse  ° °Chief Complaint:  Hospitalization Follow-up ([facility] - decreased HBG ) ° ° °HPI: °Misty [name] is a 78 y.o. female presenting on 07/20/2019 for Hospitalization Follow-up ([place] - decreased HBG ) ° °1. Hospital discharge follow-up  °The patient was discharged from [facility] on 07/13/2019 with a primary diagnosis of acute blood loss anemia, IDA, chronic a-fib, chronic systolic heart failure, depression, anxiety, and GERD with esophagitis. ° °Through chart review and discussion with the patient I have determined that management of their condition is of high complexity.  °  °2. Iron deficiency anemia due to chronic blood loss  °Pt reports she feels like she is regaining her strength. States she is still weak but improving. Hgb 10 and Hct 34.9 upon discharge from hospital. Pt denies rectal bleeding, hematuria, or abnormal bleeding or bruising.  °  °3. Hypocalcemia  °Ca 8.4 in hospital. Pt denies myalgias, spasms, chest pain, paresthesias, or irritability.  °  °4. Gastroesophageal reflux disease with esophagitis  °Compliant with medications. No cough, hemoptysis, voice change, or dysphagia. Scheduled to follow up with GI for further testing.  °  °5. Paroxysmal atrial fibrillation (HCC)  °Followed by cardiology. Pt has resumed Eliquis. She denies palpitations, increased shortness of breath, syncope, or dizziness.   ° ° ° °Relevant past medical, surgical, family, and social history reviewed and updated as indicated.  °Allergies and medications reviewed and  updated. Date reviewed: Chart in Epic. ° ° °Past Medical History:  °Diagnosis Date  °• Anxiety   ° takes Xanax daily  °• Arthritis   ° back  °• Atrial fibrillation with RVR (HCC) 03/25/2019  °• Cataract   °• Chronic back pain   °• Constipation   ° OTC stool softener prn  °• COPD (chronic obstructive pulmonary disease) (HCC)   °• Depression   °• Diverticulosis   °• GERD (gastroesophageal reflux disease)   ° takes Ranidine daily  °• Hemorrhoids   °• History of bronchitis   °• History of migraine   ° many yrs ago  °• History of shingles   °• Hyperlipidemia   °• Insomnia   °• Migraines   °• Osteoporosis   °• PONV (postoperative nausea and vomiting)   ° ° °Past Surgical History:  °Procedure Laterality Date  °• ABDOMINAL HYSTERECTOMY    °• BIOPSY  07/12/2019  ° Procedure: BIOPSY;  Surgeon: Rehman, Najeeb U, MD;  [facility];  Service: Endoscopy;;  duodenum  °• COLONOSCOPY    °• COLONOSCOPY N/A 02/17/2015  ° Procedure: COLONOSCOPY;  Surgeon: Najeeb U Rehman, MD;  [facility];  Service: Endoscopy;  Laterality: N/A;  110n - moved to 11:15 - Ann to notify pt  °• COLONOSCOPY N/A 07/12/2019  ° Procedure: COLONOSCOPY;  Surgeon: Rehman, Najeeb U, MD;  [facility];  Service: Endoscopy;  Laterality: N/A;  °• ESOPHAGOGASTRODUODENOSCOPY    °• ESOPHAGOGASTRODUODENOSCOPY N/A 01/29/2016  ° Procedure: ESOPHAGOGASTRODUODENOSCOPY (EGD);  Surgeon: Najeeb U Rehman, MD;  [facility];  Service: Endoscopy;  Laterality: N/A;  °• ESOPHAGOGASTRODUODENOSCOPY N/A 10/14/2017  °   Subjective:  Patient ID: Misty [name], female    DOB: Oct 22, 1941, 78 y.o.   MRN: [id number]  Patient Care Team: Baruch Gouty, FNP as PCP - General (Family Medicine) Minus Breeding, MD as PCP - Cardiology (Cardiology) Rogene Houston, MD as Consulting Physician (Gastroenterology) Glenna Fellows, MD as Attending Physician (Neurosurgery) Alanda Slim Neena Rhymes, MD as Consulting Physician (Ophthalmology) Ilean China, RN as Registered Nurse   Chief Complaint:  Hospitalization Follow-up [facility] - decreased HBG )   HPI: Misty [name] is a 78 y.o. female presenting on 07/20/2019 for Hospitalization Follow-up [facility] - decreased HBG )  1. Hospital discharge follow-up  The patient was discharged from [facility] on 07/13/2019 with a primary diagnosis of acute blood loss anemia, IDA, chronic a-fib, chronic systolic heart failure, depression, anxiety, and GERD with esophagitis.  Through chart review and discussion with the patient I have determined that management of their condition is of high complexity.    2. Iron deficiency anemia due to chronic blood loss  Pt reports she feels like she is regaining her strength. States she is still weak but improving. Hgb 10 and Hct 34.9 upon discharge from hospital. Pt denies rectal bleeding, hematuria, or abnormal bleeding or bruising.    3. Hypocalcemia  Ca 8.4 in hospital. Pt denies myalgias, spasms, chest pain, paresthesias, or irritability.    4. Gastroesophageal reflux disease with esophagitis  Compliant with medications. No cough, hemoptysis, voice change, or dysphagia. Scheduled to follow up with GI for further testing.    5. Paroxysmal atrial fibrillation ([place])  Followed by cardiology. Pt has resumed Eliquis. She denies palpitations, increased shortness of breath, syncope, or dizziness.      Relevant past medical, surgical, family, and social history reviewed and updated as indicated.  Allergies and medications reviewed and  updated. Date reviewed: Chart in Epic.   Past Medical History:  Diagnosis Date   Anxiety    takes Xanax daily   Arthritis    back   Atrial fibrillation with RVR (HCC) 03/25/2019   Cataract    Chronic back pain    Constipation    OTC stool softener prn   COPD (chronic obstructive pulmonary disease) (HCC)    Depression    Diverticulosis    GERD (gastroesophageal reflux disease)    takes Ranidine daily   Hemorrhoids    History of bronchitis    History of migraine    many yrs ago   History of shingles    Hyperlipidemia    Insomnia    Migraines    Osteoporosis    PONV (postoperative nausea and vomiting)     Past Surgical History:  Procedure Laterality Date   ABDOMINAL HYSTERECTOMY     BIOPSY  07/12/2019   Procedure: BIOPSY;  Surgeon: Rogene Houston, MD;  [facility];  Service: Endoscopy;;  duodenum   COLONOSCOPY     COLONOSCOPY N/A 02/17/2015   Procedure: COLONOSCOPY;  Surgeon: Rogene Houston, MD;  [facility];  Service: Endoscopy;  Laterality: N/A;  110n - moved to 11:15 - Ann to notify pt   COLONOSCOPY N/A 07/12/2019   Procedure: COLONOSCOPY;  Surgeon: Rogene Houston, MD;  [facility];  Service: Endoscopy;  Laterality: N/A;   ESOPHAGOGASTRODUODENOSCOPY     ESOPHAGOGASTRODUODENOSCOPY N/A 01/29/2016   Procedure: ESOPHAGOGASTRODUODENOSCOPY (EGD);  Surgeon: Rogene Houston, MD;  [facility];  Service: Endoscopy;  Laterality: N/A;   ESOPHAGOGASTRODUODENOSCOPY N/A  Subjective:  Patient ID: Misty [name], female    DOB: Oct 22, 1941, 78 y.o.   MRN: [id number]  Patient Care Team: Baruch Gouty, FNP as PCP - General (Family Medicine) Minus Breeding, MD as PCP - Cardiology (Cardiology) Rogene Houston, MD as Consulting Physician (Gastroenterology) Glenna Fellows, MD as Attending Physician (Neurosurgery) Alanda Slim Neena Rhymes, MD as Consulting Physician (Ophthalmology) Ilean China, RN as Registered Nurse   Chief Complaint:  Hospitalization Follow-up [facility] - decreased HBG )   HPI: Misty [name] is a 78 y.o. female presenting on 07/20/2019 for Hospitalization Follow-up [facility] - decreased HBG )  1. Hospital discharge follow-up  The patient was discharged from [facility] on 07/13/2019 with a primary diagnosis of acute blood loss anemia, IDA, chronic a-fib, chronic systolic heart failure, depression, anxiety, and GERD with esophagitis.  Through chart review and discussion with the patient I have determined that management of their condition is of high complexity.    2. Iron deficiency anemia due to chronic blood loss  Pt reports she feels like she is regaining her strength. States she is still weak but improving. Hgb 10 and Hct 34.9 upon discharge from hospital. Pt denies rectal bleeding, hematuria, or abnormal bleeding or bruising.    3. Hypocalcemia  Ca 8.4 in hospital. Pt denies myalgias, spasms, chest pain, paresthesias, or irritability.    4. Gastroesophageal reflux disease with esophagitis  Compliant with medications. No cough, hemoptysis, voice change, or dysphagia. Scheduled to follow up with GI for further testing.    5. Paroxysmal atrial fibrillation ([place])  Followed by cardiology. Pt has resumed Eliquis. She denies palpitations, increased shortness of breath, syncope, or dizziness.      Relevant past medical, surgical, family, and social history reviewed and updated as indicated.  Allergies and medications reviewed and  updated. Date reviewed: Chart in Epic.   Past Medical History:  Diagnosis Date   Anxiety    takes Xanax daily   Arthritis    back   Atrial fibrillation with RVR (HCC) 03/25/2019   Cataract    Chronic back pain    Constipation    OTC stool softener prn   COPD (chronic obstructive pulmonary disease) (HCC)    Depression    Diverticulosis    GERD (gastroesophageal reflux disease)    takes Ranidine daily   Hemorrhoids    History of bronchitis    History of migraine    many yrs ago   History of shingles    Hyperlipidemia    Insomnia    Migraines    Osteoporosis    PONV (postoperative nausea and vomiting)     Past Surgical History:  Procedure Laterality Date   ABDOMINAL HYSTERECTOMY     BIOPSY  07/12/2019   Procedure: BIOPSY;  Surgeon: Rogene Houston, MD;  [facility];  Service: Endoscopy;;  duodenum   COLONOSCOPY     COLONOSCOPY N/A 02/17/2015   Procedure: COLONOSCOPY;  Surgeon: Rogene Houston, MD;  [facility];  Service: Endoscopy;  Laterality: N/A;  110n - moved to 11:15 - Ann to notify pt   COLONOSCOPY N/A 07/12/2019   Procedure: COLONOSCOPY;  Surgeon: Rogene Houston, MD;  [facility];  Service: Endoscopy;  Laterality: N/A;   ESOPHAGOGASTRODUODENOSCOPY     ESOPHAGOGASTRODUODENOSCOPY N/A 01/29/2016   Procedure: ESOPHAGOGASTRODUODENOSCOPY (EGD);  Surgeon: Rogene Houston, MD;  [facility];  Service: Endoscopy;  Laterality: N/A;   ESOPHAGOGASTRODUODENOSCOPY N/A  °  ° °Subjective:  °Patient ID: Misty [name], female    DOB: 02/11/1941, 78 y.o.   MRN: [id number] ° °Patient Care Team: °,  M, FNP as PCP - General (Family Medicine) °Hochrein, James, MD as PCP - Cardiology (Cardiology) °Rehman, Najeeb U, MD as Consulting Physician (Gastroenterology) °Roy, Mark, MD as Attending Physician (Neurosurgery) °Mincey, Andrew Julian, MD as Consulting Physician (Ophthalmology) °Hudy, Kristen N, RN as Registered Nurse  ° °Chief Complaint:  Hospitalization Follow-up ([facility] - decreased HBG ) ° ° °HPI: °Misty [name] is a 78 y.o. female presenting on 07/20/2019 for Hospitalization Follow-up ([place] - decreased HBG ) ° °1. Hospital discharge follow-up  °The patient was discharged from [facility] on 07/13/2019 with a primary diagnosis of acute blood loss anemia, IDA, chronic a-fib, chronic systolic heart failure, depression, anxiety, and GERD with esophagitis. ° °Through chart review and discussion with the patient I have determined that management of their condition is of high complexity.  °  °2. Iron deficiency anemia due to chronic blood loss  °Pt reports she feels like she is regaining her strength. States she is still weak but improving. Hgb 10 and Hct 34.9 upon discharge from hospital. Pt denies rectal bleeding, hematuria, or abnormal bleeding or bruising.  °  °3. Hypocalcemia  °Ca 8.4 in hospital. Pt denies myalgias, spasms, chest pain, paresthesias, or irritability.  °  °4. Gastroesophageal reflux disease with esophagitis  °Compliant with medications. No cough, hemoptysis, voice change, or dysphagia. Scheduled to follow up with GI for further testing.  °  °5. Paroxysmal atrial fibrillation (HCC)  °Followed by cardiology. Pt has resumed Eliquis. She denies palpitations, increased shortness of breath, syncope, or dizziness.   ° ° ° °Relevant past medical, surgical, family, and social history reviewed and updated as indicated.  °Allergies and medications reviewed and  updated. Date reviewed: Chart in Epic. ° ° °Past Medical History:  °Diagnosis Date  °• Anxiety   ° takes Xanax daily  °• Arthritis   ° back  °• Atrial fibrillation with RVR (HCC) 03/25/2019  °• Cataract   °• Chronic back pain   °• Constipation   ° OTC stool softener prn  °• COPD (chronic obstructive pulmonary disease) (HCC)   °• Depression   °• Diverticulosis   °• GERD (gastroesophageal reflux disease)   ° takes Ranidine daily  °• Hemorrhoids   °• History of bronchitis   °• History of migraine   ° many yrs ago  °• History of shingles   °• Hyperlipidemia   °• Insomnia   °• Migraines   °• Osteoporosis   °• PONV (postoperative nausea and vomiting)   ° ° °Past Surgical History:  °Procedure Laterality Date  °• ABDOMINAL HYSTERECTOMY    °• BIOPSY  07/12/2019  ° Procedure: BIOPSY;  Surgeon: Rehman, Najeeb U, MD;  [facility];  Service: Endoscopy;;  duodenum  °• COLONOSCOPY    °• COLONOSCOPY N/A 02/17/2015  ° Procedure: COLONOSCOPY;  Surgeon: Najeeb U Rehman, MD;  [facility];  Service: Endoscopy;  Laterality: N/A;  110n - moved to 11:15 - Ann to notify pt  °• COLONOSCOPY N/A 07/12/2019  ° Procedure: COLONOSCOPY;  Surgeon: Rehman, Najeeb U, MD;  [facility];  Service: Endoscopy;  Laterality: N/A;  °• ESOPHAGOGASTRODUODENOSCOPY    °• ESOPHAGOGASTRODUODENOSCOPY N/A 01/29/2016  ° Procedure: ESOPHAGOGASTRODUODENOSCOPY (EGD);  Surgeon: Najeeb U Rehman, MD;  [facility];  Service: Endoscopy;  Laterality: N/A;  °• ESOPHAGOGASTRODUODENOSCOPY N/A 10/14/2017  °   °  ° °Subjective:  °Patient ID: Misty [name], female    DOB: 02/11/1941, 78 y.o.   MRN: [id number] ° °Patient Care Team: °,  M, FNP as PCP - General (Family Medicine) °Hochrein, James, MD as PCP - Cardiology (Cardiology) °Rehman, Najeeb U, MD as Consulting Physician (Gastroenterology) °Roy, Mark, MD as Attending Physician (Neurosurgery) °Mincey, Andrew Julian, MD as Consulting Physician (Ophthalmology) °Hudy, Kristen N, RN as Registered Nurse  ° °Chief Complaint:  Hospitalization Follow-up ([facility] - decreased HBG ) ° ° °HPI: °Misty [name] is a 78 y.o. female presenting on 07/20/2019 for Hospitalization Follow-up ([place] - decreased HBG ) ° °1. Hospital discharge follow-up  °The patient was discharged from [facility] on 07/13/2019 with a primary diagnosis of acute blood loss anemia, IDA, chronic a-fib, chronic systolic heart failure, depression, anxiety, and GERD with esophagitis. ° °Through chart review and discussion with the patient I have determined that management of their condition is of high complexity.  °  °2. Iron deficiency anemia due to chronic blood loss  °Pt reports she feels like she is regaining her strength. States she is still weak but improving. Hgb 10 and Hct 34.9 upon discharge from hospital. Pt denies rectal bleeding, hematuria, or abnormal bleeding or bruising.  °  °3. Hypocalcemia  °Ca 8.4 in hospital. Pt denies myalgias, spasms, chest pain, paresthesias, or irritability.  °  °4. Gastroesophageal reflux disease with esophagitis  °Compliant with medications. No cough, hemoptysis, voice change, or dysphagia. Scheduled to follow up with GI for further testing.  °  °5. Paroxysmal atrial fibrillation (HCC)  °Followed by cardiology. Pt has resumed Eliquis. She denies palpitations, increased shortness of breath, syncope, or dizziness.   ° ° ° °Relevant past medical, surgical, family, and social history reviewed and updated as indicated.  °Allergies and medications reviewed and  updated. Date reviewed: Chart in Epic. ° ° °Past Medical History:  °Diagnosis Date  °• Anxiety   ° takes Xanax daily  °• Arthritis   ° back  °• Atrial fibrillation with RVR (HCC) 03/25/2019  °• Cataract   °• Chronic back pain   °• Constipation   ° OTC stool softener prn  °• COPD (chronic obstructive pulmonary disease) (HCC)   °• Depression   °• Diverticulosis   °• GERD (gastroesophageal reflux disease)   ° takes Ranidine daily  °• Hemorrhoids   °• History of bronchitis   °• History of migraine   ° many yrs ago  °• History of shingles   °• Hyperlipidemia   °• Insomnia   °• Migraines   °• Osteoporosis   °• PONV (postoperative nausea and vomiting)   ° ° °Past Surgical History:  °Procedure Laterality Date  °• ABDOMINAL HYSTERECTOMY    °• BIOPSY  07/12/2019  ° Procedure: BIOPSY;  Surgeon: Rehman, Najeeb U, MD;  [facility];  Service: Endoscopy;;  duodenum  °• COLONOSCOPY    °• COLONOSCOPY N/A 02/17/2015  ° Procedure: COLONOSCOPY;  Surgeon: Najeeb U Rehman, MD;  [facility];  Service: Endoscopy;  Laterality: N/A;  110n - moved to 11:15 - Ann to notify pt  °• COLONOSCOPY N/A 07/12/2019  ° Procedure: COLONOSCOPY;  Surgeon: Rehman, Najeeb U, MD;  [facility];  Service: Endoscopy;  Laterality: N/A;  °• ESOPHAGOGASTRODUODENOSCOPY    °• ESOPHAGOGASTRODUODENOSCOPY N/A 01/29/2016  ° Procedure: ESOPHAGOGASTRODUODENOSCOPY (EGD);  Surgeon: Najeeb U Rehman, MD;  [facility];  Service: Endoscopy;  Laterality: N/A;  °• ESOPHAGOGASTRODUODENOSCOPY N/A 10/14/2017  °

## 2019-07-21 ENCOUNTER — Ambulatory Visit (INDEPENDENT_AMBULATORY_CARE_PROVIDER_SITE_OTHER): Payer: Medicare Other | Admitting: Cardiology

## 2019-07-21 ENCOUNTER — Other Ambulatory Visit: Payer: Self-pay

## 2019-07-21 ENCOUNTER — Encounter: Payer: Self-pay | Admitting: Cardiology

## 2019-07-21 VITALS — BP 89/62 | HR 143 | Ht 67.0 in | Wt 117.0 lb

## 2019-07-21 DIAGNOSIS — I5021 Acute systolic (congestive) heart failure: Secondary | ICD-10-CM | POA: Diagnosis not present

## 2019-07-21 DIAGNOSIS — D62 Acute posthemorrhagic anemia: Secondary | ICD-10-CM | POA: Diagnosis not present

## 2019-07-21 DIAGNOSIS — I959 Hypotension, unspecified: Secondary | ICD-10-CM

## 2019-07-21 DIAGNOSIS — J90 Pleural effusion, not elsewhere classified: Secondary | ICD-10-CM

## 2019-07-21 DIAGNOSIS — I4891 Unspecified atrial fibrillation: Secondary | ICD-10-CM

## 2019-07-21 LAB — ANEMIA PROFILE B
Basophils Absolute: 0.1 10*3/uL (ref 0.0–0.2)
Basos: 1 %
EOS (ABSOLUTE): 0.5 10*3/uL — ABNORMAL HIGH (ref 0.0–0.4)
Eos: 7 %
Ferritin: 20 ng/mL (ref 15–150)
Folate: 11.6 ng/mL (ref >3.0)
Hematocrit: 33 % — ABNORMAL LOW (ref 34.0–46.6)
Hemoglobin: 9.8 g/dL — ABNORMAL LOW (ref 11.1–15.9)
Immature Grans (Abs): 0 10*3/uL (ref 0.0–0.1)
Immature Granulocytes: 0 %
Iron Saturation: 11 % — ABNORMAL LOW (ref 15–55)
Iron: 37 ug/dL (ref 27–139)
Lymphocytes Absolute: 0.8 10*3/uL (ref 0.7–3.1)
Lymphs: 11 %
MCH: 27.5 pg (ref 26.6–33.0)
MCHC: 29.7 g/dL — ABNORMAL LOW (ref 31.5–35.7)
MCV: 92 fL (ref 79–97)
Monocytes Absolute: 0.5 10*3/uL (ref 0.1–0.9)
Monocytes: 8 %
Neutrophils Absolute: 5.2 10*3/uL (ref 1.4–7.0)
Neutrophils: 73 %
Platelets: 258 10*3/uL (ref 150–450)
RBC: 3.57 x10E6/uL — ABNORMAL LOW (ref 3.77–5.28)
RDW: 13.7 % (ref 11.7–15.4)
Retic Ct Pct: 1.6 % (ref 0.6–2.6)
Total Iron Binding Capacity: 329 ug/dL (ref 250–450)
UIBC: 292 ug/dL (ref 118–369)
Vitamin B-12: 719 pg/mL (ref 232–1245)
WBC: 7.1 10*3/uL (ref 3.4–10.8)

## 2019-07-21 LAB — CMP14+EGFR
ALT: 6 IU/L (ref 0–32)
AST: 15 IU/L (ref 0–40)
Albumin/Globulin Ratio: 1.6 (ref 1.2–2.2)
Albumin: 3.8 g/dL (ref 3.7–4.7)
Alkaline Phosphatase: 117 IU/L (ref 39–117)
BUN/Creatinine Ratio: 13 (ref 12–28)
BUN: 12 mg/dL (ref 8–27)
Bilirubin Total: <0.2 mg/dL (ref 0.0–1.2)
CO2: 29 mmol/L (ref 20–29)
Calcium: 9.2 mg/dL (ref 8.7–10.3)
Chloride: 101 mmol/L (ref 96–106)
Creatinine, Ser: 0.93 mg/dL (ref 0.57–1.00)
GFR calc Af Amer: 68 mL/min/{1.73_m2} (ref >59)
GFR calc non Af Amer: 59 mL/min/{1.73_m2} — ABNORMAL LOW (ref >59)
Globulin, Total: 2.4 g/dL (ref 1.5–4.5)
Glucose: 114 mg/dL — ABNORMAL HIGH (ref 65–99)
Potassium: 3.9 mmol/L (ref 3.5–5.2)
Sodium: 145 mmol/L — ABNORMAL HIGH (ref 134–144)
Total Protein: 6.2 g/dL (ref 6.0–8.5)

## 2019-07-21 MED ORDER — FERROUS SULFATE 325 (65 FE) MG PO TBEC: 325.0000 mg | DELAYED_RELEASE_TABLET | Freq: Three times a day (TID) | ORAL | 3 refills | Status: DC

## 2019-07-21 NOTE — Progress Notes (Signed)
Cardiology Office Note   Date:  07/21/2019   ID:  Misty [name], Misty [name] 1941-10-25, MRN [id number]  PCP:  Baruch Gouty, FNP  Cardiologist:   Minus Breeding, MD Referring:  Baruch Gouty, FNP  No chief complaint on file.     History of Present Illness: Misty [name] is a 78 y.o. female who presents for follow-up after recent hospitalization with GI bleed.  She has a history of cardiomyopathy.  She has mild nonobstructive coronary disease.  This was when she was in the hospital with respiratory failure.  She had pleural effusions.  I had a virtual visit with her.  We have been managing her reduced ejection fraction medically.  She was in the emergency room last week with acute blood loss anemia.  She had upper and lower endoscopy but they could not find a source.  She is probably to have a capsule endoscopy.  I did review the GI records and actually sent a message to the hospitalist who cared for her.  She had some erosive esophagitis and a hiatal hernia that was not felt to be contributory to her anemia.  It was decided that she would go home off of iron pills but that she could go home on anticoagulation.  She has had her hemoglobin checked yesterday and it was down a little bit from her discharge hemoglobin.  She is due to have labs again on Tuesday.  She is not noticing any bleeding.  She is weak but not as weak as she was when she went into the hospital.  She is not having any new shortness of breath, PND or orthopnea.  She is not having any new palpitations, presyncope or syncope.  He denies any chest pressure, neck or arm discomfort.  He has had no weight gain or edema.  I did note in the hospital that her chest x-ray did not demonstrate pleural effusions.  She has had a low blood pressure continued.  Past Medical History:  Diagnosis Date  . Anxiety    takes Xanax daily  . Arthritis    back  . Atrial fibrillation with RVR ([place]) 03/25/2019  . Cataract   . Chronic back pain   .  Constipation    OTC stool softener prn  . COPD (chronic obstructive pulmonary disease) ([place])   . Depression   . Diverticulosis   . GERD (gastroesophageal reflux disease)    takes Ranidine daily  . Hemorrhoids   . History of bronchitis   . History of migraine    many yrs ago  . History of shingles   . Hyperlipidemia   . Insomnia   . Migraines   . Osteoporosis   . PONV (postoperative nausea and vomiting)     Past Surgical History:  Procedure Laterality Date  . ABDOMINAL HYSTERECTOMY    . BIOPSY  07/12/2019   Procedure: BIOPSY;  Surgeon: Rogene Houston, MD;  [facility];  Service: Endoscopy;;  duodenum  . COLONOSCOPY    . COLONOSCOPY N/A 02/17/2015   Procedure: COLONOSCOPY;  Surgeon: Rogene Houston, MD;  [facility];  Service: Endoscopy;  Laterality: N/A;  110n - moved to 11:15 - Ann to notify pt  . COLONOSCOPY N/A 07/12/2019   Procedure: COLONOSCOPY;  Surgeon: Rogene Houston, MD;  [facility];  Service: Endoscopy;  Laterality: N/A;  . ESOPHAGOGASTRODUODENOSCOPY    . ESOPHAGOGASTRODUODENOSCOPY N/A 01/29/2016   Procedure: ESOPHAGOGASTRODUODENOSCOPY (EGD);  Surgeon: Mechele Dawley  Laural Golden, MD;  [facility];  Service: Endoscopy;  Laterality: N/A;  . ESOPHAGOGASTRODUODENOSCOPY N/A 10/14/2017   Procedure: ESOPHAGOGASTRODUODENOSCOPY (EGD);  Surgeon: Rogene Houston, MD;  [facility];  Service: Endoscopy;  Laterality: N/A;  730  . ESOPHAGOGASTRODUODENOSCOPY N/A 07/12/2019   Procedure: ESOPHAGOGASTRODUODENOSCOPY (EGD);  Surgeon: Rogene Houston, MD;  [facility];  Service: Endoscopy;  Laterality: N/A;  . EYE SURGERY Right 3/15   cataracts  . INTRAMEDULLARY (IM) NAIL INTERTROCHANTERIC Left 01/07/2019   Procedure: INTRAMEDULLARY (IM) NAIL INTERTROCHANTRIC;  Surgeon: Carole Civil, MD;  Location: [facility];  Service: Orthopedics;  Laterality: Left;  . KYPHOPLASTY N/A 07/08/2014   Procedure: Thoracic Eleven Kyphoplasty;   Surgeon: Hosie Spangle, MD;  Location: [place] [facility];  Service: Neurosurgery;  Laterality: N/A;  . left hip surgery    . LUMBAR LAMINECTOMY/DECOMPRESSION MICRODISCECTOMY Bilateral 05/20/2013   Procedure: LUMBAR LAMINECTOMY/DECOMPRESSION MICRODISCECTOMY 1 LEVEL;  Surgeon: Hosie Spangle, MD;  Location: [place] [facility];  Service: Neurosurgery;  Laterality: Bilateral;  Bilateral Lumbar four-five laminotomy and left lumbar four-five microdiskectomy  . RIGHT/LEFT HEART CATH AND CORONARY ANGIOGRAPHY N/A 03/31/2019   Procedure: RIGHT/LEFT HEART CATH AND CORONARY ANGIOGRAPHY;  Surgeon: Sherren Mocha, MD;  Location: [place] [facility];  Service: Cardiovascular;  Laterality: N/A;  . SPINE SURGERY     Dr Carloyn Manner -  vertebraplasty     Current Outpatient Medications  Medication Sig Dispense Refill  . acetaminophen (TYLENOL) 500 MG tablet Take 500 mg by mouth every 8 (eight) hours.     Marland Kitchen albuterol (PROVENTIL HFA;VENTOLIN HFA) 108 (90 Base) MCG/ACT inhaler Inhale 2 puffs into the lungs every 6 (six) hours as needed for wheezing or shortness of breath (cough). 1 Inhaler 0  . [START ON 07/24/2019] ALPRAZolam (XANAX) 0.5 MG tablet Take 1 tablet (0.5 mg total) by mouth 2 (two) times daily as needed for anxiety. 60 tablet 3  . apixaban (ELIQUIS) 5 MG TABS tablet Take 1 tablet (5 mg total) by mouth 2 (two) times daily.    . bisoprolol (ZEBETA) 5 MG tablet Take 1 tablet (5 mg total) by mouth daily. 90 tablet 3  . cholecalciferol (VITAMIN D) 1000 UNITS tablet Take 2,000-4,000 Units by mouth daily. Take 2000 units daily except take 4000 units on Saturday and Sunday.    . escitalopram (LEXAPRO) 20 MG tablet TAKE ONE TABLET AT BEDTIME 30 tablet 3  . furosemide (LASIX) 20 MG tablet Take 1 tablet (20 mg total) by mouth daily. 90 tablet 3  . losartan (COZAAR) 25 MG tablet Take 0.5 tablets (12.5 mg total) by mouth daily. 45 tablet 3  . Multiple Vitamin (MULTIVITAMIN WITH MINERALS) TABS tablet Take 1 tablet by mouth daily.  Centrum Silver    . pantoprazole (PROTONIX) 40 MG tablet Take 1 tablet (40 mg total) by mouth 2 (two) timesdaily before a meal. 60 tablet 5  . potassium chloride SA (K-DUR) 20 MEQ tablet Take 1 tablet (20 mEq total) by mouth daily for 30 days. 30 tablet 1  . vitamin B-12 (CYANOCOBALAMIN) 1000 MCG tablet Take 1,000 mcg by mouth daily.    . ferrous sulfate 325 (65 FE) MG EC tablet Take 1 tablet (325 mg total) by mouth 3 (three) times daily with meals. (Patient not taking: Reported on 07/21/2019) 270 tablet 3   No current facility-administered medications for this visit.     Family History:  The patient's family history includes Cirrhosis in her son; Heart attack in her mother; Heart disease  in her father and son; Hypertension in her mother; Macular degeneration in her father.    ROS:  Please see the history of present illness.   Otherwise, review of systems are positive for back pain.   All other systems are reviewed and negative.    PHYSICAL EXAM: VS:  BP (!) 89/62   Pulse (!) 143   Ht 5' 7"  (1.702 m)   Wt 117 lb (53.1 kg)   BMI 18.32 kg/m  , BMI Body mass index is 18.32 kg/m. GENERAL: Frail appearing HEENT:  Pupils equal round and reactive, fundi not visualized, oral mucosa unremarkable NECK:  No jugular venous distention, waveform within normal limits, carotid upstroke brisk and symmetric, no bruits, no thyromegaly LYMPHATICS:  No cervical, inguinal adenopathy LUNGS:  Wheezing.  BACK:  No CVA tenderness CHEST:  Unremarkable HEART:  PMI not displaced or sustained,S1 and S2 within normal limits, no S3, no S4, no clicks, no rubs, no murmurs ABD:  Flat, positive bowel sounds normal in frequency in pitch, no bruits, no rebound, no guarding, no midline pulsatile mass, no hepatomegaly, no splenomegaly EXT:  2 plus pulses throughout, no edema, no cyanosis no clubbing SKIN:  No rashes no nodules NEURO:  Cranial nerves II through XII grossly intact, motor grossly intact throughout PSYCH:   Cognitively intact, oriented to person place and time    EKG:  EKG is not ordered today. The ekg ordered today demonstrates    Recent Labs: 04/02/2019: Magnesium 2.1 07/09/2019: TSH 2.340 07/10/2019: B Natriuretic Peptide 365.0 07/20/2019: ALT 6; BUN 12; Creatinine, Ser 0.93; Hemoglobin 9.8; Platelets 258; Potassium 3.9; Sodium 145    Lipid Panel    Component Value Date/Time   CHOL 125 07/09/2019 1256   TRIG 101 07/09/2019 1256   TRIG 72 01/09/2015 0948   HDL 45 07/09/2019 1256   HDL 52 01/09/2015 0948   CHOLHDL 2.8 07/09/2019 1256   CHOLHDL 2.7 03/26/2019 0224   VLDL 13 03/26/2019 0224   LDLCALC 60 07/09/2019 1256   LDLCALC 73 08/25/2014 0943      Wt Readings from Last 3 Encounters:  07/21/19 117 lb (53.1 kg)  07/20/19 115 lb (52.2 kg)  07/10/19 119 lb 4.3 oz (54.1 kg)      Other studies Reviewed: Additional studies/ records that were reviewed today include: Extensive review of hospital records. Review of the above records demonstrates:  Please see elsewhere in the note.     ASSESSMENT AND PLAN:  CARDIOMYOPATHY:  I would like to titrate her meds with her blood pressure is low.  She is frail with her recent anemia.  I am actually can hold the Cozaar for now.   MR: This was moderate on echo and I will follow this up in the future with repeat echocardiography.  ATRIAL FIB:    Ms. CHANTAL WORTHEY has a CHA2DS2 - VASc score of 5.    I clarified that she was cleared to continue anticoagulation and she should given her risk of stroke unless there is an absolute contraindication or she has progressive anemia.  I reviewed this with her.  Of note she was in sinus on her last EKG in the hospital.   ACUTE SYSTOLIC HF:  She seems to be euvolemic.  She is going to continue to avoid salt.  Meds as above.  MILDLY ELEVATED TROPONIN:    She had this initially when she was hospitalized.  However, this was felt to be demand ischemia.  No further work-up.    PLEURAL  EFFUSION:   As  above this resolved.    Current medicines are reviewed at length with the patient today.  The patient does not have concerns regarding medicines.  The following changes have been made:  no change  Labs/ tests ordered today include: None No orders of the defined types were placed in this encounter.    Disposition:   FU with in one month.     Signed, Minus Breeding, MD  07/21/2019 2:53 PM    [place]

## 2019-07-21 NOTE — Patient Instructions (Signed)
Medication Instructions:  Please hold your Cozaar until you are seen by Dr Percival Spanish 9/16.  If you need a refill on your cardiac medications before your next appointment, please call your pharmacy.   Follow-Up: Please return as scheduled.  Thank you for choosing [facility]!!

## 2019-07-22 NOTE — Addendum Note (Signed)
Addended by: Baruch Gouty on: 07/22/2019 01:10 PM   Modules accepted: Level of Service

## 2019-07-23 ENCOUNTER — Other Ambulatory Visit: Payer: Self-pay | Admitting: Family Medicine

## 2019-07-23 NOTE — Telephone Encounter (Signed)
[facility] for fill today - per [employer] = called.   Attempted to call pt - NA

## 2019-07-28 ENCOUNTER — Other Ambulatory Visit: Payer: Self-pay

## 2019-07-28 ENCOUNTER — Inpatient Hospital Stay (HOSPITAL_COMMUNITY)
Admission: EM | Admit: 2019-07-28 | Discharge: 2019-07-30 | DRG: 378 | Disposition: A | Discharge status: Home / Self Care | Payer: Medicare Other | Source: Ambulatory Visit | Attending: Internal Medicine | Admitting: Internal Medicine

## 2019-07-28 ENCOUNTER — Encounter (HOSPITAL_COMMUNITY): Payer: Self-pay

## 2019-07-28 ENCOUNTER — Encounter: Payer: Self-pay | Admitting: *Deleted

## 2019-07-28 ENCOUNTER — Other Ambulatory Visit: Payer: Medicare Other

## 2019-07-28 DIAGNOSIS — D62 Acute posthemorrhagic anemia: Secondary | ICD-10-CM | POA: Diagnosis present

## 2019-07-28 DIAGNOSIS — F419 Anxiety disorder, unspecified: Secondary | ICD-10-CM | POA: Diagnosis present

## 2019-07-28 DIAGNOSIS — Z8619 Personal history of other infectious and parasitic diseases: Secondary | ICD-10-CM

## 2019-07-28 DIAGNOSIS — D500 Iron deficiency anemia secondary to blood loss (chronic): Secondary | ICD-10-CM | POA: Diagnosis not present

## 2019-07-28 DIAGNOSIS — R195 Other fecal abnormalities: Secondary | ICD-10-CM | POA: Diagnosis present

## 2019-07-28 DIAGNOSIS — Z03818 Encounter for observation for suspected exposure to other biological agents ruled out: Secondary | ICD-10-CM | POA: Diagnosis not present

## 2019-07-28 DIAGNOSIS — J449 Chronic obstructive pulmonary disease, unspecified: Secondary | ICD-10-CM | POA: Diagnosis present

## 2019-07-28 DIAGNOSIS — K922 Gastrointestinal hemorrhage, unspecified: Principal | ICD-10-CM | POA: Diagnosis present

## 2019-07-28 DIAGNOSIS — I480 Paroxysmal atrial fibrillation: Secondary | ICD-10-CM | POA: Diagnosis present

## 2019-07-28 DIAGNOSIS — I959 Hypotension, unspecified: Secondary | ICD-10-CM | POA: Diagnosis present

## 2019-07-28 DIAGNOSIS — E785 Hyperlipidemia, unspecified: Secondary | ICD-10-CM | POA: Diagnosis present

## 2019-07-28 DIAGNOSIS — Z90710 Acquired absence of both cervix and uterus: Secondary | ICD-10-CM

## 2019-07-28 DIAGNOSIS — Z881 Allergy status to other antibiotic agents status: Secondary | ICD-10-CM

## 2019-07-28 DIAGNOSIS — I700 Atherosclerosis of aorta: Secondary | ICD-10-CM | POA: Diagnosis not present

## 2019-07-28 DIAGNOSIS — M25552 Pain in left hip: Secondary | ICD-10-CM | POA: Diagnosis present

## 2019-07-28 DIAGNOSIS — K648 Other hemorrhoids: Secondary | ICD-10-CM | POA: Diagnosis present

## 2019-07-28 DIAGNOSIS — F411 Generalized anxiety disorder: Secondary | ICD-10-CM | POA: Diagnosis present

## 2019-07-28 DIAGNOSIS — Z8719 Personal history of other diseases of the digestive system: Secondary | ICD-10-CM

## 2019-07-28 DIAGNOSIS — Z888 Allergy status to other drugs, medicaments and biological substances status: Secondary | ICD-10-CM

## 2019-07-28 DIAGNOSIS — M549 Dorsalgia, unspecified: Secondary | ICD-10-CM | POA: Diagnosis present

## 2019-07-28 DIAGNOSIS — Z7901 Long term (current) use of anticoagulants: Secondary | ICD-10-CM

## 2019-07-28 DIAGNOSIS — F329 Major depressive disorder, single episode, unspecified: Secondary | ICD-10-CM | POA: Diagnosis present

## 2019-07-28 DIAGNOSIS — D649 Anemia, unspecified: Secondary | ICD-10-CM

## 2019-07-28 DIAGNOSIS — I5022 Chronic systolic (congestive) heart failure: Secondary | ICD-10-CM | POA: Diagnosis present

## 2019-07-28 DIAGNOSIS — M810 Age-related osteoporosis without current pathological fracture: Secondary | ICD-10-CM | POA: Diagnosis present

## 2019-07-28 DIAGNOSIS — F17210 Nicotine dependence, cigarettes, uncomplicated: Secondary | ICD-10-CM | POA: Diagnosis present

## 2019-07-28 DIAGNOSIS — K8681 Exocrine pancreatic insufficiency: Secondary | ICD-10-CM | POA: Diagnosis not present

## 2019-07-28 DIAGNOSIS — K219 Gastro-esophageal reflux disease without esophagitis: Secondary | ICD-10-CM | POA: Diagnosis present

## 2019-07-28 DIAGNOSIS — Z66 Do not resuscitate: Secondary | ICD-10-CM | POA: Diagnosis present

## 2019-07-28 DIAGNOSIS — K3189 Other diseases of stomach and duodenum: Secondary | ICD-10-CM | POA: Diagnosis not present

## 2019-07-28 DIAGNOSIS — K900 Celiac disease: Secondary | ICD-10-CM | POA: Diagnosis present

## 2019-07-28 DIAGNOSIS — Z20828 Contact with and (suspected) exposure to other viral communicable diseases: Secondary | ICD-10-CM | POA: Diagnosis present

## 2019-07-28 DIAGNOSIS — I482 Chronic atrial fibrillation, unspecified: Secondary | ICD-10-CM | POA: Diagnosis not present

## 2019-07-28 DIAGNOSIS — K766 Portal hypertension: Secondary | ICD-10-CM | POA: Diagnosis present

## 2019-07-28 DIAGNOSIS — K644 Residual hemorrhoidal skin tags: Secondary | ICD-10-CM | POA: Diagnosis not present

## 2019-07-28 DIAGNOSIS — Z885 Allergy status to narcotic agent status: Secondary | ICD-10-CM

## 2019-07-28 DIAGNOSIS — Z79899 Other long term (current) drug therapy: Secondary | ICD-10-CM

## 2019-07-28 DIAGNOSIS — Z886 Allergy status to analgesic agent status: Secondary | ICD-10-CM

## 2019-07-28 DIAGNOSIS — F17200 Nicotine dependence, unspecified, uncomplicated: Secondary | ICD-10-CM | POA: Diagnosis not present

## 2019-07-28 DIAGNOSIS — D509 Iron deficiency anemia, unspecified: Secondary | ICD-10-CM | POA: Diagnosis not present

## 2019-07-28 DIAGNOSIS — E559 Vitamin D deficiency, unspecified: Secondary | ICD-10-CM | POA: Diagnosis present

## 2019-07-28 DIAGNOSIS — I745 Embolism and thrombosis of iliac artery: Secondary | ICD-10-CM | POA: Diagnosis not present

## 2019-07-28 DIAGNOSIS — G8929 Other chronic pain: Secondary | ICD-10-CM | POA: Diagnosis present

## 2019-07-28 DIAGNOSIS — Z8249 Family history of ischemic heart disease and other diseases of the circulatory system: Secondary | ICD-10-CM

## 2019-07-28 DIAGNOSIS — Z9889 Other specified postprocedural states: Secondary | ICD-10-CM | POA: Diagnosis not present

## 2019-07-28 DIAGNOSIS — K589 Irritable bowel syndrome without diarrhea: Secondary | ICD-10-CM | POA: Diagnosis present

## 2019-07-28 LAB — CBC WITH DIFFERENTIAL/PLATELET
Abs Immature Granulocytes: 0.04 10*3/uL (ref 0.00–0.07)
Basophils Absolute: 0.1 10*3/uL (ref 0.0–0.1)
Basophils Relative: 1 %
Eosinophils Absolute: 0.3 10*3/uL (ref 0.0–0.5)
Eosinophils Relative: 4 %
HCT: 24 % — ABNORMAL LOW (ref 36.0–46.0)
Hemoglobin: 6.8 g/dL — CL (ref 12.0–15.0)
Immature Granulocytes: 1 %
Lymphocytes Relative: 13 %
Lymphs Abs: 1 10*3/uL (ref 0.7–4.0)
MCH: 27.5 pg (ref 26.0–34.0)
MCHC: 28.3 g/dL — ABNORMAL LOW (ref 30.0–36.0)
MCV: 97.2 fL (ref 80.0–100.0)
Monocytes Absolute: 0.5 10*3/uL (ref 0.1–1.0)
Monocytes Relative: 7 %
Neutro Abs: 5.7 10*3/uL (ref 1.7–7.7)
Neutrophils Relative %: 74 %
Platelets: 271 10*3/uL (ref 150–400)
RBC: 2.47 MIL/uL — ABNORMAL LOW (ref 3.87–5.11)
RDW: 15 % (ref 11.5–15.5)
WBC: 7.6 10*3/uL (ref 4.0–10.5)
nRBC: 0 % (ref 0.0–0.2)

## 2019-07-28 LAB — BASIC METABOLIC PANEL
Anion gap: 7 (ref 5–15)
BUN: 19 mg/dL (ref 8–23)
CO2: 29 mmol/L (ref 22–32)
Calcium: 8.5 mg/dL — ABNORMAL LOW (ref 8.9–10.3)
Chloride: 101 mmol/L (ref 98–111)
Creatinine, Ser: 1 mg/dL (ref 0.44–1.00)
GFR calc Af Amer: >60 mL/min (ref >60)
GFR calc non Af Amer: 54 mL/min — ABNORMAL LOW (ref >60)
Glucose, Bld: 128 mg/dL — ABNORMAL HIGH (ref 70–99)
Potassium: 3.9 mmol/L (ref 3.5–5.1)
Sodium: 137 mmol/L (ref 135–145)

## 2019-07-28 LAB — POC OCCULT BLOOD, ED: Fecal Occult Bld: POSITIVE — AB

## 2019-07-28 LAB — PROTIME-INR
INR: 1.5 — ABNORMAL HIGH (ref 0.8–1.2)
Prothrombin Time: 17.6 seconds — ABNORMAL HIGH (ref 11.4–15.2)

## 2019-07-28 LAB — PREPARE RBC (CROSSMATCH)

## 2019-07-28 LAB — HEMOGLOBIN, FINGERSTICK: Hemoglobin: 7 g/dL — CL (ref 11.1–15.9)

## 2019-07-28 MED ORDER — ACETAMINOPHEN 650 MG RE SUPP: 650.0000 mg | Freq: Four times a day (QID) | RECTAL | Status: DC | PRN

## 2019-07-28 MED ORDER — SODIUM CHLORIDE 0.9 % IV SOLN
INTRAVENOUS | Status: DC
  Administered 2019-07-28: 22:00:00 via INTRAVENOUS

## 2019-07-28 MED ORDER — PANTOPRAZOLE SODIUM 40 MG IV SOLR
INTRAVENOUS | Status: AC
  Filled 2019-07-28: qty 80

## 2019-07-28 MED ORDER — SODIUM CHLORIDE 0.9 % IV SOLN
80.0000 mg | Freq: Two times a day (BID) | INTRAVENOUS | Status: DC
  Administered 2019-07-28 – 2019-07-30 (×4): 80 mg via INTRAVENOUS
  Filled 2019-07-28 (×11): qty 80

## 2019-07-28 MED ORDER — BISOPROLOL FUMARATE 5 MG PO TABS
5.0000 mg | ORAL_TABLET | Freq: Every day | ORAL | Status: DC
  Administered 2019-07-29 – 2019-07-30 (×2): 5 mg via ORAL
  Filled 2019-07-28 (×3): qty 1

## 2019-07-28 MED ORDER — SODIUM CHLORIDE 0.9 % IV SOLN
10.0000 mL/h | Freq: Once | INTRAVENOUS | Status: AC
  Administered 2019-07-28: 19:00:00 10 mL/h via INTRAVENOUS

## 2019-07-28 MED ORDER — ONDANSETRON 4 MG PO TBDP
4.0000 mg | ORAL_TABLET | Freq: Once | ORAL | Status: AC
  Administered 2019-07-28: 20:00:00 4 mg via ORAL
  Filled 2019-07-28: qty 1

## 2019-07-28 MED ORDER — ESCITALOPRAM OXALATE 10 MG PO TABS
20.0000 mg | ORAL_TABLET | Freq: Every day | ORAL | Status: DC
  Administered 2019-07-28 – 2019-07-29 (×2): 20 mg via ORAL
  Filled 2019-07-28 (×2): qty 2

## 2019-07-28 MED ORDER — ACETAMINOPHEN 325 MG PO TABS
650.0000 mg | ORAL_TABLET | Freq: Four times a day (QID) | ORAL | Status: DC | PRN
  Administered 2019-07-29: 11:00:00 650 mg via ORAL
  Filled 2019-07-28: qty 2

## 2019-07-28 MED ORDER — ALPRAZOLAM 0.5 MG PO TABS
0.5000 mg | ORAL_TABLET | Freq: Two times a day (BID) | ORAL | Status: DC | PRN
  Administered 2019-07-30: 0.5 mg via ORAL
  Filled 2019-07-28: qty 1

## 2019-07-28 MED ORDER — ONDANSETRON HCL 4 MG/2ML IJ SOLN: 4.0000 mg | Freq: Four times a day (QID) | INTRAMUSCULAR | Status: DC | PRN

## 2019-07-28 MED ORDER — FUROSEMIDE 20 MG PO TABS
20.0000 mg | ORAL_TABLET | Freq: Every day | ORAL | Status: DC
  Administered 2019-07-29 – 2019-07-30 (×2): 20 mg via ORAL
  Filled 2019-07-28 (×2): qty 1

## 2019-07-28 MED ORDER — ONDANSETRON HCL 4 MG PO TABS: 4.0000 mg | ORAL_TABLET | Freq: Four times a day (QID) | ORAL | Status: DC | PRN

## 2019-07-28 NOTE — H&P (Signed)
History and Physical    Misty [name] YQM:[id number] DOB: 09/26/1941 DOA: 07/28/2019  PCP: Sonny Masters, FNP  Patient coming from: Home  I have personally briefly reviewed patient's old medical records in [facility]  Chief Complaint: Weakness, fatigue  HPI: Misty [name] is a 78 y.o. female with medical history significant of iron deficiency anemia, atrial fibrillation on Eliquis, GERD, HLD, chronic systolic congestive heart failure, IBS who presents to the ED with chief complaint of weakness and fatigue.  Patient was starting to feel the symptoms today, went to her PCP for blood drawn noted that she was anemic.  Was recently hospitalized with EGD and colonoscopy that was unrevealing, and GI was planning on capsule endoscopy later this month.  No other specific complaints at this time.  ED Course: Temperature 98.2, HR 83, RR 20, BP 119/58, SPO2 95% on room air.  Hemoglobin 6.8, WBC count 7.6, platelets 271.  Sodium 137, potassium 3.9, chloride 101, CO2 29, BUN 19, creatinine 1.00, glucose 128.  FOBT was positive.  INR 1.5.  EDP consulted GI with recommendations of hospital admission, n.p.o. after midnight with planned capsule endoscopy in the morning.  Patient was ordered 1 unit PRBC.  Hospital service consulted for admission for symptomatic anemia secondary to GI bleed.  Review of Systems: As per HPI otherwise 10 point review of systems negative.    Past Medical History:  Diagnosis Date   Anxiety    takes Xanax daily   Arthritis    back   Atrial fibrillation with RVR (HCC) 03/25/2019   Cataract    Chronic back pain    Constipation    OTC stool softener prn   COPD (chronic obstructive pulmonary disease) (HCC)    Depression    Diverticulosis    GERD (gastroesophageal reflux disease)    takes Ranidine daily   Hemorrhoids    History of bronchitis    History of migraine    many yrs ago   History of shingles    Hyperlipidemia    Insomnia    Migraines      Osteoporosis    PONV (postoperative nausea and vomiting)     Past Surgical History:  Procedure Laterality Date   ABDOMINAL HYSTERECTOMY     BIOPSY  07/12/2019   Procedure: BIOPSY;  Surgeon: Malissa Hippo, MD;  [facility]: [facility];  Service: Endoscopy;;  duodenum   COLONOSCOPY     COLONOSCOPY N/A 02/17/2015   Procedure: COLONOSCOPY;  Surgeon: Malissa Hippo, MD;  Location: [facility];  Service: Endoscopy;  Laterality: N/A;  110n - moved to 11:15 - Ann to notify pt   COLONOSCOPY N/A 07/12/2019   Procedure: COLONOSCOPY;  Surgeon: Malissa Hippo, MD;  [facility]: [facility];  Service: Endoscopy;  Laterality: N/A;   ESOPHAGOGASTRODUODENOSCOPY     ESOPHAGOGASTRODUODENOSCOPY N/A 01/29/2016   Procedure: ESOPHAGOGASTRODUODENOSCOPY (EGD);  Surgeon: Malissa Hippo, MD;  [facility]: [facility];  Service: Endoscopy;  Laterality: N/A;   ESOPHAGOGASTRODUODENOSCOPY N/A 10/14/2017   Procedure: ESOPHAGOGASTRODUODENOSCOPY (EGD);  Surgeon: Malissa Hippo, MD;  [facility];  Service: Endoscopy;  Laterality: N/A;  730   ESOPHAGOGASTRODUODENOSCOPY N/A 07/12/2019   Procedure: ESOPHAGOGASTRODUODENOSCOPY (EGD);  Surgeon: Malissa Hippo, MD;  [facility]: [facility];  Service: Endoscopy;  Laterality: N/A;   EYE SURGERY Right 3/15   cataracts   INTRAMEDULLARY (IM) NAIL INTERTROCHANTERIC Left 01/07/2019   Procedure: INTRAMEDULLARY (IM) NAIL INTERTROCHANTRIC;  Surgeon: Vickki Hearing, MD;  Location: [facility];  Service: Orthopedics;  Laterality: Left;   KYPHOPLASTY N/A 07/08/2014   Procedure: Thoracic Eleven Kyphoplasty;  Surgeon: Hewitt Shorts, MD;  Location: [facility];  Service: Neurosurgery;  Laterality: N/A;   left hip surgery     LUMBAR LAMINECTOMY/DECOMPRESSION MICRODISCECTOMY Bilateral 05/20/2013   Procedure: LUMBAR LAMINECTOMY/DECOMPRESSION MICRODISCECTOMY 1 LEVEL;  Surgeon: Hewitt Shorts, MD;  Location: [facility];  Service: Neurosurgery;  Laterality:  Bilateral;  Bilateral Lumbar four-five laminotomy and left lumbar four-five microdiskectomy   RIGHT/LEFT HEART CATH AND CORONARY ANGIOGRAPHY N/A 03/31/2019   Procedure: RIGHT/LEFT HEART CATH AND CORONARY ANGIOGRAPHY;  Surgeon: Tonny Bollman, MD;  Location: [facility] INVAS[facility] CV LAB;  Service: Cardiovascular;  Laterality: N/A;   SPINE SURGERY     Dr Channing Mutters -  vertebraplasty     reports that she has been smoking cigarettes. She started smoking about 60 years ago. She has a 52.00 pack-year smoking history. She has never used smokeless tobacco. She reports that she does not drink alcohol or use drugs.  Allergies  Allergen Reactions   Codeine Nausea And Vomiting   Tramadol Nausea And Vomiting   Asa [Aspirin] Other (See Comments)    Patient is unaware of allergy   Azithromycin Other (See Comments)    Patient is unaware of allergy   Celebrex [Celecoxib] Other (See Comments)    Irritated stomach    Cymbalta [Duloxetine Hcl] Other (See Comments)    Felt groggy   Prednisone Rash    She has seen the podiatrist and he had injected cortisone in her feet and she had also taken a peel at home. She had a severe reaction to her face with a rash and had to take Benadryl to resolve the symptom. She actually did get more cortisone shots, but no additional reactions to the shots.   Vioxx [Rofecoxib] Other (See Comments)    Unknown   Zelnorm [Tegaserod] Other (See Comments)    Patient is unaware of allergy   Zocor [Simvastatin] Other (See Comments)    Patient is unaware of allergy    Family History  Problem Relation Age of Onset   Hypertension Mother    Heart attack Mother    Macular degeneration Father    Heart disease Father    Cirrhosis Son    Heart disease Son    Colon cancer Neg Hx      Prior to Admission medications   Medication Sig Start Date End Date Taking? Authorizing Provider  acetaminophen (TYLENOL) 500 MG tablet Take 500 mg by mouth every 8 (eight) hours.  01/20/19    [provider]  albuterol (PROVENTIL HFA;VENTOLIN HFA) 108 (90 Base) MCG/ACT inhaler Inhale 2 puffs into the lungs every 6 (six) hours as needed for wheezing or shortness of breath (cough). 01/28/19   Sharee Holster, NP  ALPRAZolam Prudy Feeler) 0.5 MG tablet Take 1 tablet (0.5 mg total) by mouth 2 (two) times daily as needed for anxiety. 07/24/19 08/23/19  Sonny Masters, FNP  apixaban (ELIQUIS) 5 MG TABS tablet Take 1 tablet (5 mg total) by mouth 2 (two) times daily. 07/15/19   Vassie Loll, MD  bisoprolol (ZEBETA) 5 MG tablet Take 1 tablet (5 mg total) by mouth daily. 04/07/19   Rollene Rotunda, MD  cholecalciferol (VITAMIN D) 1000 UNITS tablet Take 2,000-4,000 Units by mouth daily. Take 2000 units daily except take 4000 units on Saturday and Sunday.    [provider]  escitalopram (LEXAPRO) 20 MG tablet TAKE ONE TABLET AT BEDTIME 06/24/19  Rehman, Joline Maxcy, MD  ferrous sulfate 325 (65 FE) MG EC tablet Take 1 tablet (325 mg total) by mouth 3 (three) times daily with meals. Patient not taking: Reported on 07/21/2019 07/21/19 10/19/19  Sonny Masters, FNP  furosemide (LASIX) 20 MG tablet Take 1 tablet (20 mg total) by mouth daily. 04/07/19   Rollene Rotunda, MD  losartan (COZAAR) 25 MG tablet Take 0.5 tablets (12.5 mg total) by mouth daily. 04/28/19   Rollene Rotunda, MD  Multiple Vitamin (MULTIVITAMIN WITH MINERALS) TABS tablet Take 1 tablet by mouth daily. Centrum Silver    [provider]  pantoprazole (PROTONIX) 40 MG tablet Take 1 tablet (40 mg total) by mouth 2 (two) timesdaily before a meal. 05/24/19   Rehman, Joline Maxcy, MD  potassium chloride SA (K-DUR) 20 MEQ tablet Take 1 tablet (20 mEq total) by mouth daily for 30 days. 04/19/19 07/21/19  Sonny Masters, FNP  vitamin B-12 (CYANOCOBALAMIN) 1000 MCG tablet Take 1,000 mcg by mouth daily.    [provider]    Physical Exam: Vitals:   07/28/19 1916 07/28/19 1930 07/28/19 1935 07/28/19 2000  BP: (!) 142/53 (!) 121/59 (!)  119/58 (!) 132/59  Pulse: 80 82 83 80  Resp: 17 18 20  (!) 22  Temp: 97.9 F (36.6 C)  98.2 F (36.8 C)   TempSrc: Oral  Oral   SpO2: 92% 91% 95% 95%  Weight:        Constitutional: NAD, calm, comfortable Vitals:   07/28/19 1916 07/28/19 1930 07/28/19 1935 07/28/19 2000  BP: (!) 142/53 (!) 121/59 (!) 119/58 (!) 132/59  Pulse: 80 82 83 80  Resp: 17 18 20  (!) 22  Temp: 97.9 F (36.6 C)  98.2 F (36.8 C)   TempSrc: Oral  Oral   SpO2: 92% 91% 95% 95%  Weight:       Eyes: PERRL, normal appearance of her lids, pale conjunctival ENMT: Mucous membranes are moist. Posterior pharynx clear of any exudate or lesions.Normal dentition.  Neck: normal, supple, no masses, no thyromegaly Respiratory: clear to auscultation bilaterally, no wheezing, no crackles. Normal respiratory effort. No accessory muscle use.  Cardiovascular: Regular rate and rhythm, no murmurs / rubs / gallops. No extremity edema. 2+ pedal pulses. No carotid bruits.  Abdomen: no tenderness, no masses palpated. No hepatosplenomegaly. Bowel sounds positive.  Musculoskeletal: no clubbing / cyanosis. No joint deformity upper and lower extremities. Good ROM, no contractures. Normal muscle tone.  Skin: no rashes, lesions, ulcers. No induration.  Pale complexion Neurologic: CN 2-12 grossly intact. Sensation intact, DTR normal. Strength 5/5 in all 4.  Psychiatric: Normal judgment and insight. Alert and oriented x 3. Normal mood.     Labs on Admission: I have personally reviewed following labs and imaging studies  CBC: Recent Labs  Lab 07/28/19 1715  WBC 7.6  NEUTROABS 5.7  HGB 6.8*  HCT 24.0*  MCV 97.2  PLT 271   Basic Metabolic Panel: Recent Labs  Lab 07/28/19 1715  NA 137  K 3.9  CL 101  CO2 29  GLUCOSE 128*  BUN 19  CREATININE 1.00  CALCIUM 8.5*   GFR: Estimated Creatinine Clearance: 38.2 mL/min (by C-G formula based on SCr of 1 mg/dL). Liver Function Tests: No results for input(s): AST, ALT, ALKPHOS,  BILITOT, PROT, ALBUMIN in the last 168 hours. No results for input(s): LIPASE, AMYLASE in the last 168 hours. No results for input(s): AMMONIA in the last 168 hours. Coagulation Profile: Recent Labs  Lab 07/28/19 1715  INR 1.5*   Cardiac Enzymes: No results for input(s): CKTOTAL, CKMB, CKMBINDEX, TROPONINI in the last 168 hours. BNP (last 3 results) No results for input(s): PROBNP in the last 8760 hours. HbA1C: No results for input(s): HGBA1C in the last 72 hours. CBG: No results for input(s): GLUCAP in the last 168 hours. Lipid Profile: No results for input(s): CHOL, HDL, LDLCALC, TRIG, CHOLHDL, LDLDIRECT in the last 72 hours. Thyroid Function Tests: No results for input(s): TSH, T4TOTAL, FREET4, T3FREE, THYROIDAB in the last 72 hours. Anemia Panel: No results for input(s): VITAMINB12, FOLATE, FERRITIN, TIBC, IRON, RETICCTPCT in the last 72 hours. Urine analysis:    Component Value Date/Time   COLORURINE YELLOW 01/14/2019 0226   APPEARANCEUR CLEAR 01/14/2019 0226   LABSPEC 1.012 01/14/2019 0226   PHURINE 7.0 01/14/2019 0226   GLUCOSEU NEGATIVE 01/14/2019 0226   HGBUR NEGATIVE 01/14/2019 0226   BILIRUBINUR NEGATIVE 01/14/2019 0226   KETONESUR NEGATIVE 01/14/2019 0226   PROTEINUR NEGATIVE 01/14/2019 0226   NITRITE NEGATIVE 01/14/2019 0226   LEUKOCYTESUR NEGATIVE 01/14/2019 0226    Radiological Exams on Admission: No results found.  EKG: Independently reviewed.  NSR, rate 76, QTC prolonged at 520, no concerning ST elevation/depressions or T wave inversions, normal intervals otherwise  Assessment/Plan Principal Problem:   GI bleed Active Problems:   GERD (gastroesophageal reflux disease)   Tobacco use disorder   Iron deficiency anemia   Paroxysmal atrial fibrillation (HCC)   Symptomatic anemia Acute blood loss anemia GI bleed Patient presenting after lab draw at her PCP office with low hemoglobin.  Patient has been complaining of some weakness and fatigue that is  progressing and worsened today.  Hemoglobin admission 6.8 with a positive FOBT.  She underwent EGD/colonoscopy on 07/12/2019 with notable esophageal erosions, diverticulosis and hemorrhoids, no obvious source of bleeding.  GI was planning a capsule endoscopy later this week. --GI consulted by EDP, plan capsule endoscopy tomorrow --Holding Eliquis, last dose reported evening of 07/27/2019 --Protonix and milligrams IV twice daily --Receiving 1 unit PRBC that was ordered by ED physician --Clear liquid diet, n.p.o. after midnight --Start gentle IV fluid hydration after midnight with NS at 50 mL's per hour --Supportive care, monitor on telemetry  Atrial fibrillation on anticoagulation with Eliquis EKG currently in NSR, which is confirmed on telemetry.  On Eliquis 5 mg p.o. twice daily, bisoprolol 5 mg daily for rate control at home. --Holding Eliquis in the setting of acute blood loss anemia as above --Continue bisoprolol 5 mg p.o. daily --Continue to monitor on telemetry  Iron deficiency anemia Was previously prescribed iron supplementation, but has not been taking.  Hemoglobin 6.8 on admission with MCV of 97.2. --Receiving 1 unit PRBC as above  GERD On Protonix 40 mg p.o. twice daily at home. --Hold oral Protonix, continue IV Protonix 80 mg IV twice daily  Chronic systolic congestive heart failure; compensated TTE 03/26/2019 with EF 30-35%.  On losartan 12.5 mg p.o. daily, bisoprolol 5 mg p.o. daily, and Lasix 20 mg p.o. daily at home.  Appears compensated on physical exam. --We will continue home bisoprolol and Lasix. --Hold losartan given the borderline hypotension and concern for GI bleed as above --Continue monitor strict I's and O's and daily weights  Tobacco use disorder Patient reports continues to smoke 1/2 pack/day.  States she has quit intermittently but currently continues.  Discussed with patient need for total cessation. --Declines nicotine patch as makes her "sweats"  DVT  prophylaxis: SCDs, chemical DVT prophylaxis contraindicated in active GI bleed Code  Status: DNR Family Communication: None, offered patient to call her husband, she declined. Disposition Plan: Anticipate discharge home when medically stable, pending GI evaluation Consults called: Gastroenterology, notified by EDP Admission status: Inpatient  Severity of Illness: The appropriate patient status for this patient is INPATIENT. Inpatient status is judged to be reasonable and necessary in order to provide the required intensity of service to ensure the patient's safety. The patient's presenting symptoms, physical exam findings, and initial radiographic and laboratory data in the context of their chronic comorbidities is felt to place them at high risk for further clinical deterioration. Furthermore, it is not anticipated that the patient will be medically stable for discharge from the hospital within 2 midnights of admission. The following factors support the patient status of inpatient.   " The patient's presenting symptoms include weakness/fatigue " The worrisome physical exam findings include significant pallor of skin/mucous membranes " The initial radiographic and laboratory data are worrisome because of hemoglobin of 6.8, positive FOBT " The chronic co-morbidities include paroxysmal atrial fibrillation on chronic anticoagulation with Eliquis, chronic systolic congestive heart failure.   * I certify that at the point of admission it is my clinical judgment that the patient will require inpatient hospital care spanning beyond 2 midnights from the point of admission due to high intensity of service, high risk for further deterioration and high frequency of surveillance required.*   Alvira Philips [place] DO [facility] Pager [phone]  If 7PM-7AM, please contact night-coverage [email or web address] Password [facility]  07/28/2019, 8:57 PM

## 2019-07-28 NOTE — ED Provider Notes (Signed)
[facility] Provider Note   CSN: [id number] Arrival date & time: 07/28/19  1626     History   Chief Complaint Chief Complaint  Patient presents with  . Abnormal Lab    HPI Misty [name] is a 78 y.o. female.     The history is provided by the patient and medical records. No language interpreter was used.  Abnormal Lab    78 year old female with history of iron deficiency anemia recently hospitalized from 8/22 until 8/25 for acute blood loss anemia receiving 2 units of packed red blood cells.  Report having recent colonoscopy and endoscopy during hospitalization without active source of bleeding.  She did have a positive Hemoccult during the hospitalization.  Initially her hemoglobin was 5.6 which subsequently improved to 10 on discharge.  Patient reported to have a hemoglobin of 6.6 on today's blood work.  She denies having any active pain but does endorse generalized weakness especially when she moves about using her walker.  She does not complain of fever or chills.  She does endorse a cough which is not new and is nonproductive.  COVID-19 test during her hospitalization was negative.  She does have chronic back and hip pain with a recent left hip fracture in March pain is not new.  She denies any abnormal bleeding and denies black or tarry stool.  No chest pain or shortness of breath no document history of cancer.  She is currently not taking her Eliquis or iron supplementation since been discharged per recommendations of her doctor.       Past Medical History:  Diagnosis Date  . Anxiety    takes Xanax daily  . Arthritis    back  . Atrial fibrillation with RVR (HCC) 03/25/2019  . Cataract   . Chronic back pain   . Constipation    OTC stool softener prn  . COPD (chronic obstructive pulmonary disease) (HCC)   . Depression   . Diverticulosis   . GERD (gastroesophageal reflux disease)    takes Ranidine daily  . Hemorrhoids   . History of bronchitis    . History of migraine    many yrs ago  . History of shingles   . Hyperlipidemia   . Insomnia   . Migraines   . Osteoporosis   . PONV (postoperative nausea and vomiting)     Patient Active Problem List   Diagnosis Date Noted  . Gastroesophageal reflux disease with esophagitis 07/20/2019  . Paroxysmal atrial fibrillation (HCC) 07/20/2019  . Anemia 07/10/2019  . Aortic atherosclerosis (HCC) 07/09/2019  . Vitamin D deficiency 07/09/2019  . GAD (generalized anxiety disorder) 07/09/2019  . Depression, recurrent (HCC) 07/09/2019  . Controlled substance agreement signed 07/09/2019  . Educated About Covid-19 Virus Infection 04/07/2019  . S/P thoracentesis   . Pleural effusion, right   . Acute systolic heart failure (HCC) 03/31/2019  . Lobar pneumonia (HCC)   . Acute hypoxemic respiratory failure (HCC)   . Community acquired pneumonia 03/26/2019  . Elevated troponin 03/25/2019  . S/P IM nail left hip 01/07/2019 01/26/2019  . H/O kyphoplasty 01/14/2019  . Iron deficiency anemia 01/14/2019  . Acute blood loss anemia 01/13/2019  . Constipation due to opioid therapy 01/13/2019  . Anxiety and depression 01/13/2019  . Hypokalemia 01/10/2019  . Displaced intertrochanteric fracture of left femur, sequela 01/06/2019  . Chronic midline low back pain without sciatica 10/09/2018  . Chronic midline thoracic back pain 10/09/2018  . Osteopenia determined by x-ray 04/16/2018  .  Pulmonary fibrosis (HCC) 04/07/2018  . Non-intractable vomiting with nausea 10/06/2017  . Malnutrition of moderate degree 01/29/2016  . Generalized weakness 01/27/2016  . Abnormal chest x-ray 01/27/2016  . Tobacco use disorder 01/27/2016  . Colitis   . Hematochezia 02/26/2015  . Ischemic colitis (HCC) 02/26/2015  . Orthostatic hypotension 02/26/2015  . Nontraumatic compression fracture of T11 vertebra (HCC) 07/08/2014  . Hyperlipidemia 08/19/2013  . Fibrocystic breast disease 08/19/2013  . History of migraine  headaches 08/19/2013  . GERD (gastroesophageal reflux disease) 04/07/2013  . Osteoporosis, postmenopausal 04/07/2013    Past Surgical History:  Procedure Laterality Date  . ABDOMINAL HYSTERECTOMY    . BIOPSY  07/12/2019   Procedure: BIOPSY;  Surgeon: Malissa Hippo, MD;  [facility];  Service: Endoscopy;;  duodenum  . COLONOSCOPY    . COLONOSCOPY N/A 02/17/2015   Procedure: COLONOSCOPY;  Surgeon: Malissa Hippo, MD;  [facility]: [facility];  Service: Endoscopy;  Laterality: N/A;  110n - moved to 11:15 - Ann to notify pt  . COLONOSCOPY N/A 07/12/2019   Procedure: COLONOSCOPY;  Surgeon: Malissa Hippo, MD;  [facility];  Service: Endoscopy;  Laterality: N/A;  . ESOPHAGOGASTRODUODENOSCOPY    . ESOPHAGOGASTRODUODENOSCOPY N/A 01/29/2016   Procedure: ESOPHAGOGASTRODUODENOSCOPY (EGD);  Surgeon: Malissa Hippo, MD;  [facility];  Service: Endoscopy;  Laterality: N/A;  . ESOPHAGOGASTRODUODENOSCOPY N/A 10/14/2017   Procedure: ESOPHAGOGASTRODUODENOSCOPY (EGD);  Surgeon: Malissa Hippo, MD;  [facility];  Service: Endoscopy;  Laterality: N/A;  730  . ESOPHAGOGASTRODUODENOSCOPY N/A 07/12/2019   Procedure: ESOPHAGOGASTRODUODENOSCOPY (EGD);  Surgeon: Malissa Hippo, MD;  [facility];  Service: Endoscopy;  Laterality: N/A;  . EYE SURGERY Right 3/15   cataracts  . INTRAMEDULLARY (IM) NAIL INTERTROCHANTERIC Left 01/07/2019   Procedure: INTRAMEDULLARY (IM) NAIL INTERTROCHANTRIC;  Surgeon: Vickki Hearing, MD;  Location: [facility];  Service: Orthopedics;  Laterality: Left;  . KYPHOPLASTY N/A 07/08/2014   Procedure: Thoracic Eleven Kyphoplasty;  Surgeon: Hewitt Shorts, MD;  Location: [facility];  Service: Neurosurgery;  Laterality: N/A;  . left hip surgery    . LUMBAR LAMINECTOMY/DECOMPRESSION MICRODISCECTOMY Bilateral 05/20/2013   Procedure: LUMBAR LAMINECTOMY/DECOMPRESSION MICRODISCECTOMY 1 LEVEL;  Surgeon: Hewitt Shorts, MD;   Location: [facility];  Service: Neurosurgery;  Laterality: Bilateral;  Bilateral Lumbar four-five laminotomy and left lumbar four-five microdiskectomy  . RIGHT/LEFT HEART CATH AND CORONARY ANGIOGRAPHY N/A 03/31/2019   Procedure: RIGHT/LEFT HEART CATH AND CORONARY ANGIOGRAPHY;  Surgeon: Tonny Bollman, MD;  [facility]: [facility] INVASIVE CV LAB;  Service: Cardiovascular;  Laterality: N/A;  . SPINE SURGERY     Dr Channing Mutters -  vertebraplasty     OB History   No obstetric history on file.      Home Medications    Prior to Admission medications   Medication Sig Start Date End Date Taking? Authorizing Provider  acetaminophen (TYLENOL) 500 MG tablet Take 500 mg by mouth every 8 (eight) hours.  01/20/19   [provider]  albuterol (PROVENTIL HFA;VENTOLIN HFA) 108 (90 Base) MCG/ACT inhaler Inhale 2 puffs into the lungs every 6 (six) hours as needed for wheezing or shortness of breath (cough). 01/28/19   Sharee Holster, NP  ALPRAZolam Prudy Feeler) 0.5 MG tablet Take 1 tablet (0.5 mg total) by mouth 2 (two) times daily as needed for anxiety. 07/24/19 08/23/19  Sonny Masters, FNP  apixaban (ELIQUIS) 5 MG TABS tablet Take 1 tablet (5 mg total) by mouth 2 (two)  times daily. 07/15/19   Vassie LollMadera, Carlos, MD  bisoprolol (ZEBETA) 5 MG tablet Take 1 tablet (5 mg total) by mouth daily. 04/07/19   Rollene RotundaHochrein, James, MD  cholecalciferol (VITAMIN D) 1000 UNITS tablet Take 2,000-4,000 Units by mouth daily. Take 2000 units daily except take 4000 units on Saturday and Sunday.    [provider]  escitalopram (LEXAPRO) 20 MG tablet TAKE ONE TABLET AT BEDTIME 06/24/19   Rehman, Joline MaxcyNajeeb U, MD  ferrous sulfate 325 (65 FE) MG EC tablet Take 1 tablet (325 mg total) by mouth 3 (three) times daily with meals. Patient not taking: Reported on 07/21/2019 07/21/19 10/19/19  Sonny Mastersakes, Linda M, FNP  furosemide (LASIX) 20 MG tablet Take 1 tablet (20 mg total) by mouth daily. 04/07/19   Rollene RotundaHochrein, James, MD  losartan (COZAAR) 25 MG tablet Take 0.5  tablets (12.5 mg total) by mouth daily. 04/28/19   Rollene RotundaHochrein, James, MD  Multiple Vitamin (MULTIVITAMIN WITH MINERALS) TABS tablet Take 1 tablet by mouth daily. Centrum Silver    [provider]  pantoprazole (PROTONIX) 40 MG tablet Take 1 tablet (40 mg total) by mouth 2 (two) timesdaily before a meal. 05/24/19   Rehman, Joline MaxcyNajeeb U, MD  potassium chloride SA (K-DUR) 20 MEQ tablet Take 1 tablet (20 mEq total) by mouth daily for 30 days. 04/19/19 07/21/19  Sonny Mastersakes, Linda M, FNP  vitamin B-12 (CYANOCOBALAMIN) 1000 MCG tablet Take 1,000 mcg by mouth daily.    [provider]    Family History Family History  Problem Relation Age of Onset  . Hypertension Mother   . Heart attack Mother   . Macular degeneration Father   . Heart disease Father   . Cirrhosis Son   . Heart disease Son   . Colon cancer Neg Hx     Social History Social History   Tobacco Use  . Smoking status: Current Some Day Smoker    Packs/day: 1.00    Years: 52.00    Pack years: 52.00    Types: Cigarettes    Start date: 11/18/1958    Last attempt to quit: 08/20/2011    Years since quitting: 7.9  . Smokeless tobacco: Never Used  . Tobacco comment: 1/2-1pack off and on all her life  Substance Use Topics  . Alcohol use: No    Alcohol/week: 0.0 standard drinks  . Drug use: No     Allergies   Codeine, Tramadol, Asa [aspirin], Azithromycin, Celebrex [celecoxib], Cymbalta [duloxetine hcl], Prednisone, Vioxx [rofecoxib], Zelnorm [tegaserod], and Zocor [simvastatin]   Review of Systems Review of Systems  All other systems reviewed and are negative.    Physical Exam Updated Vital Signs BP (!) 109/58 (BP Location: Right Arm)   Pulse 80   Temp (!) 97.5 F (36.4 C)   Wt 52.2 kg   SpO2 96%   BMI 18.01 kg/m   Physical Exam Vitals signs and nursing note reviewed. Exam conducted with a chaperone present.  Constitutional:      General: She is not in acute distress.    Appearance: She is well-developed.      Comments: Pale appearing elderly female in no acute discomfort.  HENT:     Head: Atraumatic.  Eyes:     Extraocular Movements: Extraocular movements intact.     Conjunctiva/sclera: Conjunctivae normal.     Pupils: Pupils are equal, round, and reactive to light.  Neck:     Musculoskeletal: Neck supple. No neck rigidity.  Cardiovascular:     Rate and Rhythm: Normal rate  and regular rhythm.     Pulses: Normal pulses.     Heart sounds: Normal heart sounds.  Pulmonary:     Effort: Pulmonary effort is normal.     Breath sounds: Normal breath sounds.  Abdominal:     Palpations: Abdomen is soft.     Tenderness: There is no abdominal tenderness.  Genitourinary:    Comments: Dark color stool without melena.  Hemoccult positive.  No frank bleeding. Musculoskeletal:        General: No swelling.  Skin:    Capillary Refill: Capillary refill takes less than 2 seconds.     Coloration: Skin is pale.     Findings: No rash.  Neurological:     Mental Status: She is alert and oriented to person, place, and time.  Psychiatric:        Mood and Affect: Mood normal.      ED Treatments / Results  Labs (all labs ordered are listed, but only abnormal results are displayed) Labs Reviewed  CBC WITH DIFFERENTIAL/PLATELET - Abnormal; Notable for the following components:      Result Value   RBC 2.47 (*)    Hemoglobin 6.8 (*)    HCT 24.0 (*)    MCHC 28.3 (*)    All other components within normal limits  BASIC METABOLIC PANEL - Abnormal; Notable for the following components:   Glucose, Bld 128 (*)    Calcium 8.5 (*)    GFR calc non Af Amer 54 (*)    All other components within normal limits  PROTIME-INR - Abnormal; Notable for the following components:   Prothrombin Time 17.6 (*)    INR 1.5 (*)    All other components within normal limits  POC OCCULT BLOOD, ED - Abnormal; Notable for the following components:   Fecal Occult Bld POSITIVE (*)    All other components within normal limits  SARS  CORONAVIRUS 2 (TAT 6-24 HRS)  PREPARE RBC (CROSSMATCH)  TYPE AND SCREEN    EKG None  Radiology No results found.  Procedures .Critical Care Performed by: Fayrene Helperran, Rollan Roger, PA-C Authorized by: Fayrene Helperran, Luvenia Cranford, PA-C   Critical care provider statement:    Critical care time (minutes):  45   Critical care was time spent personally by me on the following activities:  Discussions with consultants, evaluation of patient's response to treatment, examination of patient, ordering and performing treatments and interventions, ordering and review of laboratory studies, ordering and review of radiographic studies, pulse oximetry, re-evaluation of patient's condition, obtaining history from patient or surrogate and review of old charts   (including critical care time)  Medications Ordered in ED Medications  0.9 %  sodium chloride infusion (10 mL/hr Intravenous New Bag/Given 07/28/19 1915)  ondansetron (ZOFRAN-ODT) disintegrating tablet 4 mg (4 mg Oral Given 07/28/19 1951)     Initial Impression / Assessment and Plan / ED Course  I have reviewed the triage vital signs and the nursing notes.  Pertinent labs & imaging results that were available during my care of the patient were reviewed by me and considered in my medical decision making (see chart for details).        BP (!) 119/58   Pulse 83   Temp 98.2 F (36.8 C) (Oral)   Resp 20   Wt 52.2 kg   SpO2 95%   BMI 18.01 kg/m    Final Clinical Impressions(s) / ED Diagnoses   Final diagnoses:  Symptomatic anemia  Gastrointestinal hemorrhage, unspecified gastrointestinal hemorrhage type  ED Discharge Orders    None     5:34 PM Patient with history of iron deficiency anemia which appears to be chronic here for having low hemoglobin on her blood work today.  Patient states she has a hemoglobin of 6.6, recently discharged from the hospital 2 weeks ago when her hemoglobin was 10.  No obvious active source of bleeding was identified during  her last hospitalization.  She is symptomatic with her anemia with complaints of generalized weakness.  She will benefit from blood transfusion and admission she is currently not on her Eliquis for A. Fib.  6:00 PM Hemoglobin is currently 6.8.  Fecal occult blood test is positive. Dark Color stool without evidence of melena.  Patient will receive blood transfusion for symptomatic anemia likely secondary to GI bleed.  Appreciate consultation from GI specialist, Dr. Laural Golden who recommend patient to be n.p.o. at midnight, and will performed video capsule tomorrow at 7:30.  Will consult for admission.  7:58 PM Appreciate consultation from [facility] Dr. [place] who agrees to see and admit pt for further care.    Domenic Moras, PA-C 07/28/19 [place], MD 07/30/19 1050

## 2019-07-28 NOTE — ED Triage Notes (Signed)
Pt's Hemoglobin was 6.6 she states and needs a blood transfusion. Pt chronically anemic.

## 2019-07-28 NOTE — Addendum Note (Signed)
Addended by: Marin Olp on: 07/28/2019 03:22 PM   Modules accepted: Orders

## 2019-07-28 NOTE — Progress Notes (Signed)
Pt here for rck labs Pt SOB and very pale hgb 6.8 Per MMM, pt sent to ER

## 2019-07-28 NOTE — ED Notes (Signed)
CRITICAL VALUE ALERT  Critical Value:  Hemoglobin 6.8  Date & Time Notied: 1744, 07/28/2019  Provider Notified: Domenic Moras PA  Orders Received/Actions taken:see chart

## 2019-07-29 ENCOUNTER — Encounter (HOSPITAL_COMMUNITY)
Admission: EM | Disposition: A | Discharge status: Home / Self Care | Payer: Self-pay | Source: Home / Self Care | Attending: Internal Medicine

## 2019-07-29 DIAGNOSIS — F17210 Nicotine dependence, cigarettes, uncomplicated: Secondary | ICD-10-CM

## 2019-07-29 DIAGNOSIS — K922 Gastrointestinal hemorrhage, unspecified: Secondary | ICD-10-CM

## 2019-07-29 DIAGNOSIS — D649 Anemia, unspecified: Secondary | ICD-10-CM

## 2019-07-29 DIAGNOSIS — D509 Iron deficiency anemia, unspecified: Secondary | ICD-10-CM

## 2019-07-29 DIAGNOSIS — D62 Acute posthemorrhagic anemia: Secondary | ICD-10-CM

## 2019-07-29 DIAGNOSIS — I482 Chronic atrial fibrillation, unspecified: Secondary | ICD-10-CM

## 2019-07-29 HISTORY — PX: GIVENS CAPSULE STUDY: SHX5432

## 2019-07-29 LAB — BASIC METABOLIC PANEL
Anion gap: 8 (ref 5–15)
BUN/Creatinine Ratio: 17 (ref 12–28)
BUN: 17 mg/dL (ref 8–27)
BUN: 16 mg/dL (ref 8–23)
CO2: 27 mmol/L (ref 20–29)
CO2: 28 mmol/L (ref 22–32)
Calcium: 9.1 mg/dL (ref 8.7–10.3)
Calcium: 8.1 mg/dL — ABNORMAL LOW (ref 8.9–10.3)
Chloride: 98 mmol/L (ref 96–106)
Chloride: 104 mmol/L (ref 98–111)
Creatinine, Ser: 1 mg/dL (ref 0.57–1.00)
Creatinine, Ser: 0.93 mg/dL (ref 0.44–1.00)
GFR calc Af Amer: 62 mL/min/{1.73_m2} (ref >59)
GFR calc Af Amer: >60 mL/min (ref >60)
GFR calc non Af Amer: 54 mL/min/{1.73_m2} — ABNORMAL LOW (ref >59)
GFR calc non Af Amer: 59 mL/min — ABNORMAL LOW (ref >60)
Glucose, Bld: 101 mg/dL — ABNORMAL HIGH (ref 70–99)
Glucose: 128 mg/dL — ABNORMAL HIGH (ref 65–99)
Potassium: 4.6 mmol/L (ref 3.5–5.2)
Potassium: 3.8 mmol/L (ref 3.5–5.1)
Sodium: 139 mmol/L (ref 134–144)
Sodium: 140 mmol/L (ref 135–145)

## 2019-07-29 LAB — TYPE AND SCREEN
ABO/RH(D): O POS
Antibody Screen: NEGATIVE
Unit division: 0

## 2019-07-29 LAB — BPAM RBC
Blood Product Expiration Date: 202010112359
ISSUE DATE / TIME: 202009091912
Unit Type and Rh: 5100

## 2019-07-29 LAB — CBC WITH DIFFERENTIAL/PLATELET
Basophils Absolute: 0.1 10*3/uL (ref 0.0–0.2)
Basos: 1 %
EOS (ABSOLUTE): 0.5 10*3/uL — ABNORMAL HIGH (ref 0.0–0.4)
Eos: 5 %
Hematocrit: 23.7 % — ABNORMAL LOW (ref 34.0–46.6)
Hemoglobin: 6.9 g/dL — CL (ref 11.1–15.9)
Immature Grans (Abs): 0.1 10*3/uL (ref 0.0–0.1)
Immature Granulocytes: 1 %
Lymphocytes Absolute: 0.7 10*3/uL (ref 0.7–3.1)
Lymphs: 8 %
MCH: 27 pg (ref 26.6–33.0)
MCHC: 29.1 g/dL — ABNORMAL LOW (ref 31.5–35.7)
MCV: 93 fL (ref 79–97)
Monocytes Absolute: 0.5 10*3/uL (ref 0.1–0.9)
Monocytes: 6 %
Neutrophils Absolute: 7 10*3/uL (ref 1.4–7.0)
Neutrophils: 79 %
Platelets: 270 10*3/uL (ref 150–450)
RBC: 2.56 x10E6/uL — CL (ref 3.77–5.28)
RDW: 14.3 % (ref 11.7–15.4)
WBC: 8.8 10*3/uL (ref 3.4–10.8)

## 2019-07-29 LAB — PROTIME-INR
INR: 1.3 — ABNORMAL HIGH (ref 0.8–1.2)
Prothrombin Time: 16 seconds — ABNORMAL HIGH (ref 11.4–15.2)

## 2019-07-29 LAB — CBC
HCT: 26.4 % — ABNORMAL LOW (ref 36.0–46.0)
Hemoglobin: 7.7 g/dL — ABNORMAL LOW (ref 12.0–15.0)
MCH: 28 pg (ref 26.0–34.0)
MCHC: 29.2 g/dL — ABNORMAL LOW (ref 30.0–36.0)
MCV: 96 fL (ref 80.0–100.0)
Platelets: 240 10*3/uL (ref 150–400)
RBC: 2.75 MIL/uL — ABNORMAL LOW (ref 3.87–5.11)
RDW: 14.8 % (ref 11.5–15.5)
WBC: 8.3 10*3/uL (ref 4.0–10.5)
nRBC: 0 % (ref 0.0–0.2)

## 2019-07-29 LAB — SARS CORONAVIRUS 2 (TAT 6-24 HRS): SARS Coronavirus 2: NEGATIVE

## 2019-07-29 SURGERY — IMAGING PROCEDURE, GI TRACT, INTRALUMINAL, VIA CAPSULE
Anesthesia: Choice

## 2019-07-29 NOTE — TOC Initial Note (Signed)
Transition of Care [facility]) - Initial/Assessment Note    Patient Details  Name: Misty [name] MRN: [id number] Date of Birth: Sep 04, 1941  [facility]) CM/SW Contact:    Ihor Gully, LCSW Phone Number: 07/29/2019, 1:20 PM  Clinical Narrative:                 Patient from home with spouse. Admitted for GI bleed. At baseline, she ambulates with a walker. She has a cane in the home. Patient has no issues with transportation to PCP nor obtaining medications. Patient has oxygen in the home through [employer] but she does not use it. She has contacted the company to pick it up but they have not.  Patient reports that the oxygen is in the way and that her doctor faxed paperwork to [employer] asking them to pick it up but they have not to date.  TOC will follow through discharge and assess as they arise  Expected Discharge Plan: Home/Self Care Barriers to Discharge: Continued Medical Work up   Patient Goals and CMS Choice        Expected Discharge Plan and Services Expected Discharge Plan: Home/Self Care         Expected Discharge Date: 07/30/19                                    Prior Living Arrangements/Services   Lives with:: Spouse Patient language and need for interpreter reviewed:: Yes        Need for Family Participation in Patient Care: Yes (Comment) Care giver support system in place?: Yes (comment) Current home services: DME Criminal Activity/Legal Involvement Pertinent to Current Situation/Hospitalization: No - Comment as needed  Activities of Daily Living Home Assistive Devices/Equipment: Gilford Rile (specify type) ADL Screening (condition at time of admission) Patient's cognitive ability adequate to safely complete daily activities?: Yes Is the patient deaf or have difficulty hearing?: No Does the patient have difficulty seeing, even when wearing glasses/contacts?: No Does the patient have difficulty concentrating, remembering, or making decisions?:  No Patient able to express need for assistance with ADLs?: Yes Does the patient have difficulty dressing or bathing?: No Independently performs ADLs?: Yes (appropriate for developmental age) Does the patient have difficulty walking or climbing stairs?: No Weakness of Legs: Both Weakness of Arms/Hands: None  Permission Sought/Granted Permission sought to share information with : PCP                Emotional Assessment Appearance:: Appears older than stated age   Affect (typically observed): Accepting, Calm Orientation: : Oriented to Self, Oriented to Place, Oriented to  Time, Oriented to Situation Alcohol / Substance Use: Not Applicable Psych Involvement: No (comment)  Admission diagnosis:  Symptomatic anemia [D64.9] Gastrointestinal hemorrhage, unspecified gastrointestinal hemorrhage type [K92.2] GI bleed [K92.2] Patient Active Problem List   Diagnosis Date Noted  . GI bleed 07/28/2019  . Gastroesophageal reflux disease with esophagitis 07/20/2019  . Paroxysmal atrial fibrillation ([place]) 07/20/2019  . Anemia 07/10/2019  . Aortic atherosclerosis ([place]) 07/09/2019  . Vitamin D deficiency 07/09/2019  . GAD (generalized anxiety disorder) 07/09/2019  . Depression, recurrent ([place]) 07/09/2019  . Controlled substance agreement signed 07/09/2019  . Educated About Covid-19 Virus Infection 04/07/2019  . S/P thoracentesis   . Pleural effusion, right   . Acute systolic heart failure ([place]) 03/31/2019  . Lobar pneumonia ([place])   . Acute hypoxemic respiratory failure ([place])   . Community  acquired pneumonia 03/26/2019  . Elevated troponin 03/25/2019  . S/P IM nail left hip 01/07/2019 01/26/2019  . H/O kyphoplasty 01/14/2019  . Iron deficiency anemia 01/14/2019  . Acute blood loss anemia 01/13/2019  . Constipation due to opioid therapy 01/13/2019  . Anxiety and depression 01/13/2019  . Hypokalemia 01/10/2019  . Displaced intertrochanteric fracture of left femur, sequela 01/06/2019  .  Chronic midline low back pain without sciatica 10/09/2018  . Chronic midline thoracic back pain 10/09/2018  . Osteopenia determined by x-ray 04/16/2018  . Pulmonary fibrosis (HCC) 04/07/2018  . Non-intractable vomiting with nausea 10/06/2017  . Malnutrition of moderate degree 01/29/2016  . Symptomatic anemia 01/27/2016  . Generalized weakness 01/27/2016  . Abnormal chest x-ray 01/27/2016  . Tobacco use disorder 01/27/2016  . Colitis   . Hematochezia 02/26/2015  . Ischemic colitis (HCC) 02/26/2015  . Orthostatic hypotension 02/26/2015  . Nontraumatic compression fracture of T11 vertebra (HCC) 07/08/2014  . Hyperlipidemia 08/19/2013  . Fibrocystic breast disease 08/19/2013  . History of migraine headaches 08/19/2013  . GERD (gastroesophageal reflux disease) 04/07/2013  . Osteoporosis, postmenopausal 04/07/2013   PCP:  Sonny Masters, FNP Pharmacy:   [facility] [place] - [address] [place] [address] Phone: [phone] Fax: [phone]  [facility] [place], [place] - [address] [place] [address] Phone: [phone] Fax: [phone]     Social Determinants of Health (SDOH) Interventions    Readmission Risk Interventions Readmission Risk Prevention Plan 07/13/2019 07/13/2019 04/02/2019  Transportation Screening - Complete -  PCP or Specialist Appt within 3-5 Days Complete - Complete  HRI or Home Care Consult - Patient refused -  Social Work Consult for Recovery Care Planning/Counseling - Complete -  Palliative Care Screening - Complete -  Medication Review (RN Care Manager) - Complete -  Some recent data might be hidden

## 2019-07-29 NOTE — Op Note (Signed)
Small Bowel [place] Capsule Study Procedure date: July 29, 2019.  Referring Provider:  Orson Eva, DO PCP:  Dr. Baruch Gouty, FNP  Indication for procedure:    Patient is 78 year old Caucasian female who has a history of celiac disease paroxysmal atrial fibrillation on anticoagulant who was hospitalized last week for GI bleed and received 2 units of PRBCs.  She had EGD and colonoscopy and no bleeding lesion was identified.  Plans were made for her to return for small bowel given capsule study on an outpatient basis. She had scheduled blood work yesterday her hemoglobin was down to 6.6.  He was 10 about 2 weeks ago when she was discharged.  Patient was therefore hospitalized for transfusion.  It was decided to proceed with small bowel given capsule study during this hospitalization. Patient is on apixaban.  Her stool is guaiac positive but she denies melena or rectal bleeding.  Oral iron had been stopped in preparation for small bowel given capsule study.   Findings:   Patient was able to swallow given capsule without any difficulty. Study duration 8 hours. Given capsule remained in stomach for the duration of study. Limited view of gastric mucosa because of presence of food debris in the stomach.   Therefore small bowel mucosa could not be evaluated.  First Gastric image: 59 sec First Duodenal image: N/A. First Ileo-Cecal Valve image: N/A. First Cecal image: NA Gastric Passage time: Cannot be calculated Small Bowel Passage time: Cannot be calculated  Summary & Recommendations:  Incomplete study as given capsule never left the stomach. Will proceed with CTA abdomen pelvis. If this study is negative will consider repeating given capsule study on an outpatient basis.

## 2019-07-29 NOTE — Progress Notes (Signed)
Patient is 78 year old Caucasian female with multiple medical problems including celiac disease on gluten-free diet chronic GERD as well as history of A. fib on anticoagulant who was recently hospitalized for GI bleed and anemia.  No bleeding lesion identified on EGD and colonoscopy.  She was scheduled for small bowel given capsule study later this month.  Now she returns with drop in her hemoglobin again.  He has received a unit of PRBCs. We will proceed with small bowel given capsule study today.

## 2019-07-29 NOTE — Progress Notes (Signed)
PROGRESS NOTE  Misty [name] WIO:[id number] DOB: 04/11/1941 DOA: 07/28/2019 PCP: Baruch Gouty, FNP  Brief History:  78 year old female with a history of atrial fibrillation on apixaban, GERD, hyperlipidemia, systolic CHF, IBS, diverticulosis, anxiety, celiac disease presenting from her primary care provider's office secondary to anemia noted on her blood work.  The patient was recently mated to the hospital from 07/10/2019 to 07/13/2019 for acute blood loss anemia.  The patient underwent EGD on 07/12/2019 which revealed grade a esophagitis with portal hypertensive gastropathy.  There is no obvious bleeding lesion.  She also underwent colonoscopy on the same day which showed diverticulosis in the sigmoid and descending colon with internal and external hemorrhoids.  During her August hospitalization, the patient was transfused with 2 units PRBC.  Since her discharge from the hospital, the patient has not had any hematochezia or melena or hematemesis.  She has complained of worsening generalized weakness and fatigue.  She denied any chest discomfort, dizziness, syncope.  Blood work at her PCPs office showed her hemoglobin to be 6.8.  As result, the patient presented for further evaluation.  GI was consulted to assist with management.  The patient was transfused with 1 unit PRBC.  Assessment/Plan: Acute blood loss anemia -Presented with hemoglobin 6.8 -Previous baseline hemoglobin~10 -Transfused 1 unit PRBC -Holding apixaban -GI consulted--> capsule endoscopy initiated  Paroxysmal atrial fibrillation -CHADSVASc = 5 -Holding apixaban -Continue bisoprolol  Chronic systolic CHF -01/24/4535 echo EF 30-35%, diffuse HK, moderate MR -Holding losartan secondary to soft blood pressures -Continue bisoprolol  Iron deficiency anemia -Patient was previously on iron, but not presently taking -Received 1 unit PRBC  GERD -Continue Protonix  Tobacco abuse -She continues to smoke 1/2 pack/day  -Declines nicotine patch I have discussed tobacco cessation with the patient.  I have counseled the patient regarding the negative impacts of continued tobacco use including but not limited to lung cancer, COPD, and cardiovascular disease.  I have discussed alternatives to tobacco and modalities that may help facilitate tobacco cessation including but not limited to biofeedback, hypnosis, and medications.  Total time spent with tobacco counseling was 4 minutes.       Disposition Plan:   Home in 1-2 days when cleared by GI Family Communication:   No Family at bedside  Consultants:  GI  Code Status:  FULL  DVT Prophylaxis:  SCDs   Procedures: As Listed in Progress Note Above  Antibiotics: None        Subjective: Patient denies fevers, chills, headache, chest pain, dyspnea, nausea, vomiting, diarrhea, abdominal pain, dysuria, hematuria, hematochezia, and melena.   Objective: Vitals:   07/28/19 2125 07/28/19 2140 07/29/19 0500 07/29/19 0540  BP: 130/71 (!) 162/77  130/66  Pulse: 79 84  85  Resp: 20 16  16   Temp: 98.8 F (37.1 C) 98.6 F (37 C)  98.1 F (36.7 C)  TempSrc: Oral Oral  Oral  SpO2: 95% 95%  (!) 89%  Weight:  54.5 kg 53.7 kg   Height:  5' 7"  (1.702 m)      Intake/Output Summary (Last 24 hours) at 07/29/2019 1109 Last data filed at 07/29/2019 0700 Gross per 24 hour  Intake 390.05 ml  Output 500 ml  Net -109.95 ml   Weight change:  Exam:   General:  Pt is alert, follows commands appropriately, not in acute distress  HEENT: No icterus, No thrush, No neck mass, [place]ud[place]/AT  Cardiovascular: RRR, S1/S2, no rubs, no gallops  Respiratory: diminished BS bilateral.  Bibasilar crackles  Abdomen: Soft/+BS, non tender, non distended, no guarding  Extremities: No edema, No lymphangitis, No petechiae, No rashes, no synovitis   Data Reviewed: I have personally reviewed following labs and imaging studies Basic Metabolic Panel: Recent Labs  Lab 07/28/19  1430 07/28/19 1715 07/29/19 0533  NA 139 137 140  K 4.6 3.9 3.8  CL 98 101 104  CO2 27 29 28   GLUCOSE 128* 128* 101*  BUN 17 19 16   CREATININE 1.00 1.00 0.93  CALCIUM 9.1 8.5* 8.1*   Liver Function Tests: No results for input(s): AST, ALT, ALKPHOS, BILITOT, PROT, ALBUMIN in the last 168 hours. No results for input(s): LIPASE, AMYLASE in the last 168 hours. No results for input(s): AMMONIA in the last 168 hours. Coagulation Profile: Recent Labs  Lab 07/28/19 1715 07/29/19 0533  INR 1.5* 1.3*   CBC: Recent Labs  Lab 07/28/19 1430 07/28/19 1715 07/29/19 0533  WBC 8.8 7.6 8.3  NEUTROABS 7.0 5.7  --   HGB 6.9* 6.8* 7.7*  HCT 23.7* 24.0* 26.4*  MCV 93 97.2 96.0  PLT 270 271 240   Cardiac Enzymes: No results for input(s): CKTOTAL, CKMB, CKMBINDEX, TROPONINI in the last 168 hours. BNP: Invalid input(s): POCBNP CBG: No results for input(s): GLUCAP in the last 168 hours. HbA1C: No results for input(s): HGBA1C in the last 72 hours. Urine analysis:    Component Value Date/Time   COLORURINE YELLOW 01/14/2019 0226   APPEARANCEUR CLEAR 01/14/2019 0226   LABSPEC 1.012 01/14/2019 0226   PHURINE 7.0 01/14/2019 0226   GLUCOSEU NEGATIVE 01/14/2019 0226   HGBUR NEGATIVE 01/14/2019 0226   BILIRUBINUR NEGATIVE 01/14/2019 0226   KETONESUR NEGATIVE 01/14/2019 0226   PROTEINUR NEGATIVE 01/14/2019 0226   NITRITE NEGATIVE 01/14/2019 0226   LEUKOCYTESUR NEGATIVE 01/14/2019 0226   Sepsis Labs: @LABRCNTIP (procalcitonin:4,lacticidven:4) )No results found for this or any previous visit (from the past 240 hour(s)).   Scheduled Meds: . bisoprolol  5 mg Oral Daily  . escitalopram  20 mg Oral QHS  . furosemide  20 mg Oral Daily   Continuous Infusions: . sodium chloride Stopped (07/28/19 2216)  . pantoprazole (PROTONIX) IV Stopped (07/28/19 2308)    Procedures/Studies: Dg Chest 2 View  Result Date: 07/10/2019 CLINICAL DATA:  Anemia EXAM: CHEST - 2 VIEW COMPARISON:  03/28/2019  FINDINGS: Cardiomegaly. No edema, effusions. Bibasilar atelectasis. Prior multilevel vertebroplasty. No acute bony abnormality. IMPRESSION: Cardiomegaly.  Bibasilar atelectasis. Electronically Signed   By: Rolm Baptise M.D.   On: 07/10/2019 17:57    Orson Eva, DO  [facility] Pager [phone]  If 7PM-7AM, please contact night-coverage [email or web address] Password [email or web address] 07/29/2019, 11:09 AM   LOS: 1 day

## 2019-07-30 ENCOUNTER — Inpatient Hospital Stay (HOSPITAL_COMMUNITY): Payer: Medicare Other

## 2019-07-30 ENCOUNTER — Encounter (HOSPITAL_COMMUNITY): Payer: Self-pay | Admitting: Internal Medicine

## 2019-07-30 DIAGNOSIS — Z9889 Other specified postprocedural states: Secondary | ICD-10-CM

## 2019-07-30 LAB — CBC
HCT: 27.7 % — ABNORMAL LOW (ref 36.0–46.0)
Hemoglobin: 8.1 g/dL — ABNORMAL LOW (ref 12.0–15.0)
MCH: 27.7 pg (ref 26.0–34.0)
MCHC: 29.2 g/dL — ABNORMAL LOW (ref 30.0–36.0)
MCV: 94.9 fL (ref 80.0–100.0)
Platelets: 248 10*3/uL (ref 150–400)
RBC: 2.92 MIL/uL — ABNORMAL LOW (ref 3.87–5.11)
RDW: 14.6 % (ref 11.5–15.5)
WBC: 7.8 10*3/uL (ref 4.0–10.5)
nRBC: 0 % (ref 0.0–0.2)

## 2019-07-30 LAB — RETICULOCYTES
Immature Retic Fract: 20.5 % — ABNORMAL HIGH (ref 2.3–15.9)
RBC.: 2.95 MIL/uL — ABNORMAL LOW (ref 3.87–5.11)
Retic Count, Absolute: 66.4 10*3/uL (ref 19.0–186.0)
Retic Ct Pct: 2.3 % (ref 0.4–3.1)

## 2019-07-30 MED ORDER — IOHEXOL 350 MG/ML SOLN
100.0000 mL | Freq: Once | INTRAVENOUS | Status: AC | PRN
  Administered 2019-07-30: 09:00:00 100 mL via INTRAVENOUS

## 2019-07-30 MED ORDER — APIXABAN 5 MG PO TABS: 5.0000 mg | ORAL_TABLET | Freq: Two times a day (BID) | ORAL | Status: DC

## 2019-07-30 NOTE — Progress Notes (Signed)
IV x2 removed, D/C instructions reviewed with patient, verbalized understanding. Patient transported to private vehicle via wheelchair.

## 2019-07-30 NOTE — Progress Notes (Signed)
  Subjective:  Patient has no complaints.  She wants to go home.  Her appetite is is fair.  She has not had a bowel movement in 24 hours.  Objective: Blood pressure 113/71, pulse 73, temperature 98.1 F (36.7 C), resp. rate 17, height 5' 7"  (1.702 m), weight 53.7 kg, SpO2 99 %. Patient is alert and in no acute distress. She remains pale. Abdomen is full but soft and nontender with organomegaly or masses.  Labs/studies Results:  CBC Latest Ref Rng & Units 07/30/2019 07/29/2019 07/28/2019  WBC 4.0 - 10.5 K/uL 7.8 8.3 7.6  Hemoglobin 12.0 - 15.0 g/dL 8.1(L) 7.7(L) 6.8(LL)  Hematocrit 36.0 - 46.0 % 27.7(L) 26.4(L) 24.0(L)  Platelets 150 - 400 K/uL 248 240 271    CMP Latest Ref Rng & Units 07/29/2019 07/28/2019 07/28/2019  Glucose 70 - 99 mg/dL 101(H) 128(H) 128(H)  BUN 8 - 23 mg/dL 16 19 17   Creatinine 0.44 - 1.00 mg/dL 0.93 1.00 1.00  Sodium 135 - 145 mmol/L 140 137 139  Potassium 3.5 - 5.1 mmol/L 3.8 3.9 4.6  Chloride 98 - 111 mmol/L 104 101 98  CO2 22 - 32 mmol/L 28 29 27   Calcium 8.9 - 10.3 mg/dL 8.1(L) 8.5(L) 9.1  Total Protein 6.0 - 8.5 g/dL - - -  Total Bilirubin 0.0 - 1.2 mg/dL - - -  Alkaline Phos 39 - 117 IU/L - - -  AST 0 - 40 IU/L - - -  ALT 0 - 32 IU/L - - -    Hepatic Function Latest Ref Rng & Units 07/20/2019 07/11/2019 07/10/2019  Total Protein 6.0 - 8.5 g/dL 6.2 5.5(L) 6.0(L)  Albumin 3.7 - 4.7 g/dL 3.8 2.9(L) 3.1(L)  AST 0 - 40 IU/L 15 13(L) 13(L)  ALT 0 - 32 IU/L 6 11 11   Alk Phosphatase 39 - 117 IU/L 117 99 106  Total Bilirubin 0.0 - 1.2 mg/dL <0.2 0.9 0.3  Bilirubin, Direct 0.0 - 0.2 mg/dL - - <0.1    CT abdomen and pelvis does not reveal any findings suggest GI bleed.  Shows atherosclerotic reduction changes to visceral arteries as well as significant stool burden.  Assessment:  Anemia secondary to chronic GI bleed.  EGD and colonoscopy few weeks ago did not reveal source of bleeding.  Given capsule yesterday was incomplete study as capsule never left the stomach.   CT abdomen pelvis does not show any bleeding lesion.  She possibly has small bowel angina dysplasia and bleeding triggered by use of anticoagulant. Since hemoglobin is 8.1 and she is tolerating it well.   Recommendations:  Stable for discharge. We will have H&H middle of next week at [facility]. Anticoagulant can be resumed on 08/01/2019. Patient is aware not to take OTC NSAIDs. Outpatient small bowel given capsule study.

## 2019-07-30 NOTE — Care Management Important Message (Signed)
Important Message  Patient Details  Name: Misty [name] MRN: [id number] Date of Birth: [birth year]   Medicare Important Message Given:  Yes     Tommy Medal 07/30/2019, 1:07 PM

## 2019-07-30 NOTE — Discharge Summary (Signed)
Physician Discharge Summary  Misty [name] EXB:[id number] DOB: 03-09-1941 DOA: 07/28/2019  PCP: Sonny Masters, FNP  Admit date: 07/28/2019 Discharge date: 07/30/2019  Admitted From: Home Disposition:  Home   Recommendations for Outpatient Follow-up:  1. Follow up with PCP in 1-2 weeks 2. Please obtain BMP/CBC in one week     Discharge Condition: Stable CODE STATUS: FULL Diet recommendation: Heart Healthy    Brief/Interim Summary: 78 year old female with a history of atrial fibrillation on apixaban, GERD, hyperlipidemia, systolic CHF, IBS, diverticulosis, anxiety, celiac disease presenting from her primary care provider's office secondary to anemia noted on her blood work.  The patient was recently mated to the hospital from 07/10/2019 to 07/13/2019 for acute blood loss anemia.  The patient underwent EGD on 07/12/2019 which revealed grade a esophagitis with portal hypertensive gastropathy.  There is no obvious bleeding lesion.  She also underwent colonoscopy on the same day which showed diverticulosis in the sigmoid and descending colon with internal and external hemorrhoids.  During her August hospitalization, the patient was transfused with 2 units PRBC.  Since her discharge from the hospital, the patient has not had any hematochezia or melena or hematemesis.  She has complained of worsening generalized weakness and fatigue.  She denied any chest discomfort, dizziness, syncope.  Blood work at her PCPs office showed her hemoglobin to be 6.8.  As result, the patient presented for further evaluation.  GI was consulted to assist with management.  The patient was transfused with 1 unit PRBC.  Her hemoglobin remained stable after transfusion.  She remained hemodynamically stable.  Capsule endoscopy was attempted, but was unsuccessful as the Givens capsule remained in stomach for the duration of study.  There was Limited view of gastric mucosa because of presence of food debris in the stomach.     Therefore small bowel mucosa could not be evaluated.  CT angiogram of the abdomen and pelvis was performed and did not reveal any evidence of bleeding or hemorrhage.  There was a large stool burden.  There were no high-grade stenosis in the SMA.  There is ASVD of the mesenteric and bilateral renal arteries.  The case was discussed with GI, Dr. Karilyn Cota who cleared the patient for discharge.  Instructions were given to the patient to hold off on restarting anticoagulation until 08/01/2019.   Discharge Diagnoses:  Acute blood loss anemia -Presented with hemoglobin 6.8 -Previous baseline hemoglobin~10 -Transfused 1 unit PRBC -Holding apixaban-->restart 08/01/19 -GI consulted--> capsule endoscopy initiated -Capsule endoscopy was unsuccessful as the Givens capsule remained in stomach for the duration of study.  There was Limited view of gastric mucosa because of presence of food debris in the stomach.  Therefore small bowel mucosa could not be evaluated.    Paroxysmal atrial fibrillation -CHADSVASc = 5 -Holding apixaban -Continue bisoprolol  Chronic systolic CHF -03/26/2019 echo EF 30-35%, diffuse HK, moderate MR -Holding losartan secondary to soft blood pressures -Continue bisoprolol  Iron deficiency anemia -Patient was previously on iron, but not presently taking -Received 1 unit PRBC -Hgb remained stable after transfusion  GERD -Continue Protonix  Tobacco abuse -She continues to smoke 1/2 pack/day -Declines nicotine patch I have discussed tobacco cessation with the patient.  I have counseled the patient regarding the negative impacts of continued tobacco use including but not limited to lung cancer, COPD, and cardiovascular disease.  I have discussed alternatives to tobacco and modalities that may help facilitate tobacco cessation including but not limited to biofeedback, hypnosis, and medications.  Total time spent  with tobacco counseling was 4 minutes.   Discharge  Instructions   Allergies as of 07/30/2019      Reactions   Codeine Nausea And Vomiting   Tramadol Nausea And Vomiting   Asa [aspirin] Other (See Comments)   Patient is unaware of allergy   Azithromycin Other (See Comments)   Patient is unaware of allergy   Celebrex [celecoxib] Other (See Comments)   Irritated stomach   Cymbalta [duloxetine Hcl] Other (See Comments)   Felt groggy   Prednisone Rash   She has seen the podiatrist and he had injected cortisone in her feet and she had also taken a peel at home. She had a severe reaction to her face with a rash and had to take Benadryl to resolve the symptom. She actually did get more cortisone shots, but no additional reactions to the shots.   Vioxx [rofecoxib] Other (See Comments)   Unknown   Zelnorm [tegaserod] Other (See Comments)   Patient is unaware of allergy   Zocor [simvastatin] Other (See Comments)   Patient is unaware of allergy      Medication List    TAKE these medications   acetaminophen 500 MG tablet Commonly known as: TYLENOL Take 500 mg by mouth every 8 (eight) hours as needed for mild pain, moderate pain or headache.   albuterol 108 (90 Base) MCG/ACT inhaler Commonly known as: VENTOLIN HFA Inhale 2 puffs into the lungs every 6 (six) hours as needed for wheezing or shortness of breath (cough).   ALPRAZolam 0.5 MG tablet Commonly known as: XANAX Take 1 tablet (0.5 mg total) by mouth 2 (two) times daily as needed for anxiety.   apixaban 5 MG Tabs tablet Commonly known as: ELIQUIS Take 1 tablet (5 mg total) by mouth 2 (two) times daily. Restart on 08/01/19 Start taking on: August 01, 2019 What changed:   additional instructions  These instructions start on August 01, 2019. If you are unsure what to do until then, ask your doctor or other care provider.   bisoprolol 5 MG tablet Commonly known as: ZEBETA Take 1 tablet (5 mg total) by mouth daily.   cholecalciferol 1000 units tablet Commonly known as:  VITAMIN D Take 2,000-4,000 Units by mouth daily. Take 2000 units daily except take 4000 units on Saturday and Sunday.   escitalopram 20 MG tablet Commonly known as: LEXAPRO TAKE ONE TABLET AT BEDTIME   furosemide 20 MG tablet Commonly known as: LASIX Take 1 tablet (20 mg total) by mouth daily.   multivitamin with minerals Tabs tablet Take 1 tablet by mouth daily. Centrum Silver   pantoprazole 40 MG tablet Commonly known as: PROTONIX Take 1 tablet (40 mg total) by mouth 2 (two) timesdaily before a meal. What changed: See the new instructions.   potassium chloride SA 20 MEQ tablet Commonly known as: K-DUR Take 1 tablet (20 mEq total) by mouth daily for 30 days. Notes to patient: Take with food   vitamin B-12 1000 MCG tablet Commonly known as: CYANOCOBALAMIN Take 1,000 mcg by mouth daily.       Allergies  Allergen Reactions   Codeine Nausea And Vomiting   Tramadol Nausea And Vomiting   Asa [Aspirin] Other (See Comments)    Patient is unaware of allergy   Azithromycin Other (See Comments)    Patient is unaware of allergy   Celebrex [Celecoxib] Other (See Comments)    Irritated stomach    Cymbalta [Duloxetine Hcl] Other (See Comments)    Felt groggy  Prednisone Rash    She has seen the podiatrist and he had injected cortisone in her feet and she had also taken a peel at home. She had a severe reaction to her face with a rash and had to take Benadryl to resolve the symptom. She actually did get more cortisone shots, but no additional reactions to the shots.   Vioxx [Rofecoxib] Other (See Comments)    Unknown   Zelnorm [Tegaserod] Other (See Comments)    Patient is unaware of allergy   Zocor [Simvastatin] Other (See Comments)    Patient is unaware of allergy    Consultations:  GI--Rehman   Procedures/Studies: Dg Chest 2 View  Result Date: 07/10/2019 CLINICAL DATA:  Anemia EXAM: CHEST - 2 VIEW COMPARISON:  03/28/2019 FINDINGS: Cardiomegaly. No edema,  effusions. Bibasilar atelectasis. Prior multilevel vertebroplasty. No acute bony abnormality. IMPRESSION: Cardiomegaly.  Bibasilar atelectasis. Electronically Signed   By: Charlett Nose M.D.   On: 07/10/2019 17:57   Ct Angio Abd/pel W/ And/or W/o  Result Date: 07/30/2019 CLINICAL DATA:  78 year old female with history of GI bleed EXAM: CTA ABDOMEN AND PELVIS wITHOUT AND WITH CONTRAST TECHNIQUE: Multidetector CT imaging of the abdomen and pelvis was performed using the standard protocol during bolus administration of intravenous contrast. Multiplanar reconstructed images and MIPs were obtained and reviewed to evaluate the vascular anatomy. CONTRAST:  OMNIPAQUE IOHEXOL 350 MG/ML SOLN COMPARISON:  July 19, 2016 FINDINGS: VASCULAR Aorta: Atherosclerotic changes of the lower thoracic and the abdominal aorta. Greatest diameter of the thoracic aorta at the hiatus measures 3.2 cm. Greatest diameter of the abdominal aorta is suprarenal, measuring 3.1 cm at the level of the SMA origin. No dissection flap.  No periaortic fluid. Celiac: Atherosclerotic changes at the celiac artery origin with less than 50% stenosis. Branches remain patent. SMA: Dense atherosclerotic calcifications at the SMA origin with likely high-grade stenosis secondary to calcified plaque. Renals: On the right there are 2 renal arteries originating at the 10 o'clock position cranially and the 11 o'clock position caudally. Both of these demonstrate significant atherosclerotic changes with estimated 50% narrowing. On the left, the main renal artery originates at the 3 o'clock position with atherosclerotic changes at the origin. There is a small accessory renal artery to the lower pole originating at the 2 o'clock position which remains patent. IMA: IMA is patent. Right lower extremity: Moderate atherosclerotic changes of the right iliac system. Diameter of the common iliac arteries 13 mm. Calcifications of the hypogastric artery which remains  patent. External iliac artery is patent. Common femoral artery patent. Proximal profunda femoris and SFA patent. Left lower extremity: Moderate atherosclerotic changes of the left iliac system. Diameter of the common iliac artery measures 11 mm. Hypogastric artery remains patent. External iliac artery is patent. Common femoral artery patent. Proximal profunda femoris and SFA are patent. Veins: Unremarkable appearance of the venous system. Review of the MIP images confirms the above findings. NON-VASCULAR Lower chest: No acute. Hepatobiliary: Redemonstration of small low-density cysts of left and right liver, similar distribution and size to the comparison CT of 2017. Gallbladder is decompressed, with no calcified cholelithiasis. Pancreas: Unremarkable Spleen: Unremarkable Adrenals/Urinary Tract: Unremarkable appearance of adrenal glands. Right: Perfusion symmetric to the left kidney. No hydronephrosis or nephrolithiasis. Unremarkable course of the right ureter. Left: Symmetric perfusion to the right. No hydronephrosis. No nephrolithiasis. Unremarkable course of the left ureter. Unremarkable appearance of the urinary bladder . Stomach/Bowel: No accumulation of contrast that with identify or confirm a source of gastrointestinal hemorrhage.  The endoscopy capsule is present within the stomach lumen. Otherwise unremarkable stomach. Unremarkable appearance of small bowel. No evidence of obstruction. Normal appendix. large fecal stool burden throughout the colon with no focal wall thickening or significant inflammatory changes. Diverticular change present. Lymphatic: No lymphadenopathy. Mesenteric: There is trace fluid within the anatomic pelvis without focal inflammatory changes within the abdomen that may suggest a etiology. Reproductive: Hysterectomy Other: No hernia. Musculoskeletal: Surgical changes of left hip repair with incomplete remodeling/healing at the fracture site. Osteopenia. Multiple levels of vertebral  augmentation, similar to prior. No new fracture identified. IMPRESSION: No acute abnormality, with no evidence of acute gastrointestinal hemorrhage. Large fecal stool burden within the colon. Aortic atherosclerosis with associated mesenteric and bilateral renal arterial disease. All 3 mesenteric arteries remain patent, with high-grade stenosis at the origin of the SMA. Bilateral moderate iliac arterial disease with no high-grade stenosis or occlusion. Additional ancillary findings as above. Signed, Yvone Neu. Reyne Dumas, RPVI [facility] Electronically Signed   By: Gilmer Mor D.O.   On: 07/30/2019 11:21         Discharge Exam: Vitals:   07/30/19 0442 07/30/19 1451  BP: 113/71 118/74  Pulse: 73 76  Resp: 17 18  Temp: 98.1 F (36.7 C) 98.4 F (36.9 C)  SpO2: 99% 97%   Vitals:   07/29/19 2104 07/30/19 0335 07/30/19 0442 07/30/19 1451  BP: 134/63  113/71 118/74  Pulse: 65  73 76  Resp: 16  17 18   Temp: 98.2 F (36.8 C)  98.1 F (36.7 C) 98.4 F (36.9 C)  TempSrc: Oral   Oral  SpO2: 100%  99% 97%  Weight:  53.7 kg    Height:        General: Pt is alert, awake, not in acute distress Cardiovascular: RRR, S1/S2 +, no rubs, no gallops Respiratory: CTA bilaterally, no wheezing, no rhonchi Abdominal: Soft, NT, ND, bowel sounds + Extremities: no edema, no cyanosis   The results of significant diagnostics from this hospitalization (including imaging, microbiology, ancillary and laboratory) are listed below for reference.    Significant Diagnostic Studies: Dg Chest 2 View  Result Date: 07/10/2019 CLINICAL DATA:  Anemia EXAM: CHEST - 2 VIEW COMPARISON:  03/28/2019 FINDINGS: Cardiomegaly. No edema, effusions. Bibasilar atelectasis. Prior multilevel vertebroplasty. No acute bony abnormality. IMPRESSION: Cardiomegaly.  Bibasilar atelectasis. Electronically Signed   By: Charlett Nose M.D.   On: 07/10/2019 17:57   Ct Angio Abd/pel  W/ And/or W/o  Result Date: 07/30/2019 CLINICAL DATA:  78 year old female with history of GI bleed EXAM: CTA ABDOMEN AND PELVIS wITHOUT AND WITH CONTRAST TECHNIQUE: Multidetector CT imaging of the abdomen and pelvis was performed using the standard protocol during bolus administration of intravenous contrast. Multiplanar reconstructed images and MIPs were obtained and reviewed to evaluate the vascular anatomy. CONTRAST:  OMNIPAQUE IOHEXOL 350 MG/ML SOLN COMPARISON:  July 19, 2016 FINDINGS: VASCULAR Aorta: Atherosclerotic changes of the lower thoracic and the abdominal aorta. Greatest diameter of the thoracic aorta at the hiatus measures 3.2 cm. Greatest diameter of the abdominal aorta is suprarenal, measuring 3.1 cm at the level of the SMA origin. No dissection flap.  No periaortic fluid. Celiac: Atherosclerotic changes at the celiac artery origin with less than 50% stenosis. Branches remain patent. SMA: Dense atherosclerotic calcifications at the SMA origin with likely high-grade stenosis secondary to calcified plaque. Renals: On the right there are 2 renal arteries originating at the 10 o'clock position cranially and the 11 o'clock  position caudally. Both of these demonstrate significant atherosclerotic changes with estimated 50% narrowing. On the left, the main renal artery originates at the 3 o'clock position with atherosclerotic changes at the origin. There is a small accessory renal artery to the lower pole originating at the 2 o'clock position which remains patent. IMA: IMA is patent. Right lower extremity: Moderate atherosclerotic changes of the right iliac system. Diameter of the common iliac arteries 13 mm. Calcifications of the hypogastric artery which remains patent. External iliac artery is patent. Common femoral artery patent. Proximal profunda femoris and SFA patent. Left lower extremity: Moderate atherosclerotic changes of the left iliac system. Diameter of the common iliac artery  measures 11 mm. Hypogastric artery remains patent. External iliac artery is patent. Common femoral artery patent. Proximal profunda femoris and SFA are patent. Veins: Unremarkable appearance of the venous system. Review of the MIP images confirms the above findings. NON-VASCULAR Lower chest: No acute. Hepatobiliary: Redemonstration of small low-density cysts of left and right liver, similar distribution and size to the comparison CT of 2017. Gallbladder is decompressed, with no calcified cholelithiasis. Pancreas: Unremarkable Spleen: Unremarkable Adrenals/Urinary Tract: Unremarkable appearance of adrenal glands. Right: Perfusion symmetric to the left kidney. No hydronephrosis or nephrolithiasis. Unremarkable course of the right ureter. Left: Symmetric perfusion to the right. No hydronephrosis. No nephrolithiasis. Unremarkable course of the left ureter. Unremarkable appearance of the urinary bladder . Stomach/Bowel: No accumulation of contrast that with identify or confirm a source of gastrointestinal hemorrhage. The endoscopy capsule is present within the stomach lumen. Otherwise unremarkable stomach. Unremarkable appearance of small bowel. No evidence of obstruction. Normal appendix. large fecal stool burden throughout the colon with no focal wall thickening or significant inflammatory changes. Diverticular change present. Lymphatic: No lymphadenopathy. Mesenteric: There is trace fluid within the anatomic pelvis without focal inflammatory changes within the abdomen that may suggest a etiology. Reproductive: Hysterectomy Other: No hernia. Musculoskeletal: Surgical changes of left hip repair with incomplete remodeling/healing at the fracture site. Osteopenia. Multiple levels of vertebral augmentation, similar to prior. No new fracture identified. IMPRESSION: No acute abnormality, with no evidence of acute gastrointestinal hemorrhage. Large fecal stool burden within the colon. Aortic atherosclerosis with associated  mesenteric and bilateral renal arterial disease. All 3 mesenteric arteries remain patent, with high-grade stenosis at the origin of the SMA. Bilateral moderate iliac arterial disease with no high-grade stenosis or occlusion. Additional ancillary findings as above. Signed, Yvone Neu. Reyne Dumas, RPVI [facility] Electronically Signed   By: Gilmer Mor D.O.   On: 07/30/2019 11:21     Microbiology: Recent Results (from the past 240 hour(s))  SARS CORONAVIRUS 2 (Rilei Kravitz 6-24 HRS) Nasopharyngeal Nasopharyngeal Swab     Status: None   Collection Time: 07/28/19  5:34 PM   Specimen: Nasopharyngeal Swab  Result Value Ref Range Status   SARS Coronavirus 2 NEGATIVE NEGATIVE Final    Comment: (NOTE) SARS-CoV-2 target nucleic acids are NOT DETECTED. The SARS-CoV-2 RNA is generally detectable in upper and lower respiratory specimens during the acute phase of infection. Negative results do not preclude SARS-CoV-2 infection, do not rule out co-infections with other pathogens, and should not be used as the sole basis for treatment or other patient management decisions. Negative results must be combined with clinical observations, patient history, and epidemiological information. The expected result is Negative. Fact Sheet for Patients: HairSlick.no Fact Sheet for Healthcare Providers: quierodirigir.com This test is not yet approved or cleared by the [place] FDA and  has  been authorized for detection and/or diagnosis of SARS-CoV-2 by FDA under an Emergency Use Authorization (EUA). This EUA will remain  in effect (meaning this test can be used) for the duration of the COVID-19 declaration under Section 56 4(b)(1) of the Act, 21 U.S.C. section 360bbb-3(b)(1), unless the authorization is terminated or revoked sooner. Performed at [facility], [address], [place],  [place] [address]      Labs: Basic Metabolic Panel: Recent Labs  Lab 07/28/19 1430 07/28/19 1715 07/29/19 0533  NA 139 137 140  K 4.6 3.9 3.8  CL 98 101 104  CO2 27 29 28   GLUCOSE 128* 128* 101*  BUN 17 19 16   CREATININE 1.00 1.00 0.93  CALCIUM 9.1 8.5* 8.1*   Liver Function Tests: No results for input(s): AST, ALT, ALKPHOS, BILITOT, PROT, ALBUMIN in the last 168 hours. No results for input(s): LIPASE, AMYLASE in the last 168 hours. No results for input(s): AMMONIA in the last 168 hours. CBC: Recent Labs  Lab 07/28/19 1430 07/28/19 1715 07/29/19 0533 07/30/19 0433  WBC 8.8 7.6 8.3 7.8  NEUTROABS 7.0 5.7  --   --   HGB 6.9* 6.8* 7.7* 8.1*  HCT 23.7* 24.0* 26.4* 27.7*  MCV 93 97.2 96.0 94.9  PLT 270 271 240 248   Cardiac Enzymes: No results for input(s): CKTOTAL, CKMB, CKMBINDEX, TROPONINI in the last 168 hours. BNP: Invalid input(s): POCBNP CBG: No results for input(s): GLUCAP in the last 168 hours.  Time coordinating discharge:  36 minutes  Signed:  Catarina Hartshorn, DO [facility] Pager: [phone] 07/30/2019, 5:38 PM

## 2019-08-03 NOTE — Progress Notes (Signed)
Cardiology Office Note   Date:  08/04/2019   ID:  Misty [name], Misty [name] [birth year], MRN [id number]  PCP:  Baruch Gouty, FNP  Cardiologist:   Minus Breeding, MD Referring:  Baruch Gouty, FNP  Chief Complaint  Patient presents with  . Atrial Fibrillation      History of Present Illness: Misty [name] is a 78 y.o. female who presents for follow-up after recent hospitalizations with GI bleed.  She has a history of cardiomyopathy.  She has mild nonobstructive coronary disease.  This was when she was in the hospital with respiratory failure.  She had pleural effusions. We have been managing her reduced ejection fraction medically.  She was in the hospital in August with anemia.   She had upper and lower endoscopy but they could not find a source.    I did review the GI records and actually sent a message to the hospitalist who cared for her.  She had some erosive esophagitis and a hiatal hernia that was not felt to be contributory to her anemia.  It was decided that she would go home off of iron pills but that she could go home on anticoagulation.  She was back in the hospital last week.  I reviewed these records for this visit.   She again had symptomatic anemia and required transfusion.  They attempted capsule endoscopy but this was incomplete.  There was no source of GI bleeding identified.  She was instructed to stay off of [place] until the 13th.    She has now resumed the blood thinner.  Her blood pressures been running low.  She has not been having any new tachypalpitations, presyncope or syncope.  She just feels tired.  She is not noticing any red blood in her bowel movements.  She is not having any chest pressure, neck or arm discomfort.  She gets around her house with a walker.   Past Medical History:  Diagnosis Date  . Anxiety    takes Xanax daily  . Arthritis    back  . Atrial fibrillation with RVR ([place]) 03/25/2019  . Cataract   . Chronic back pain   . Constipation    OTC  stool softener prn  . COPD (chronic obstructive pulmonary disease) ([place])   . Depression   . Diverticulosis   . GERD (gastroesophageal reflux disease)    takes Ranidine daily  . Hemorrhoids   . History of bronchitis   . History of migraine    many yrs ago  . History of shingles   . Hyperlipidemia   . Insomnia   . Migraines   . Osteoporosis   . PONV (postoperative nausea and vomiting)     Past Surgical History:  Procedure Laterality Date  . ABDOMINAL HYSTERECTOMY    . BIOPSY  07/12/2019   Procedure: BIOPSY;  Surgeon: Rogene Houston, MD;  [facility];  Service: Endoscopy;;  duodenum  . COLONOSCOPY    . COLONOSCOPY N/A 02/17/2015   Procedure: COLONOSCOPY;  Surgeon: Rogene Houston, MD;  [facility];  Service: Endoscopy;  Laterality: N/A;  110n - moved to 11:15 - Ann to notify pt  . COLONOSCOPY N/A 07/12/2019   Procedure: COLONOSCOPY;  Surgeon: Rogene Houston, MD;  [facility];  Service: Endoscopy;  Laterality: N/A;  . ESOPHAGOGASTRODUODENOSCOPY    . ESOPHAGOGASTRODUODENOSCOPY N/A 01/29/2016   Procedure: ESOPHAGOGASTRODUODENOSCOPY (EGD);  Surgeon: Rogene Houston, MD;  [facility];  Service:  Endoscopy;  Laterality: N/A;  . ESOPHAGOGASTRODUODENOSCOPY N/A 10/14/2017   Procedure: ESOPHAGOGASTRODUODENOSCOPY (EGD);  Surgeon: Rogene Houston, MD;  [facility];  Service: Endoscopy;  Laterality: N/A;  730  . ESOPHAGOGASTRODUODENOSCOPY N/A 07/12/2019   Procedure: ESOPHAGOGASTRODUODENOSCOPY (EGD);  Surgeon: Rogene Houston, MD;  [facility];  Service: Endoscopy;  Laterality: N/A;  . EYE SURGERY Right 3/15   cataracts  . GIVENS CAPSULE STUDY N/A 07/29/2019   Procedure: GIVENS CAPSULE STUDY;  Surgeon: Rogene Houston, MD;  [facility];  Service: Endoscopy;  Laterality: N/A;  . INTRAMEDULLARY (IM) NAIL INTERTROCHANTERIC Left 01/07/2019   Procedure: INTRAMEDULLARY (IM) NAIL INTERTROCHANTRIC;  Surgeon: Carole Civil, MD;  Location: [facility];  Service: Orthopedics;  Laterality: Left;  . KYPHOPLASTY N/A 07/08/2014   Procedure: Thoracic Eleven Kyphoplasty;  Surgeon: Hosie Spangle, MD;  Location: [place] [facility];  Service: Neurosurgery;  Laterality: N/A;  . left hip surgery    . LUMBAR LAMINECTOMY/DECOMPRESSION MICRODISCECTOMY Bilateral 05/20/2013   Procedure: LUMBAR LAMINECTOMY/DECOMPRESSION MICRODISCECTOMY 1 LEVEL;  Surgeon: Hosie Spangle, MD;  Location: [place] [facility];  Service: Neurosurgery;  Laterality: Bilateral;  Bilateral Lumbar four-five laminotomy and left lumbar four-five microdiskectomy  . RIGHT/LEFT HEART CATH AND CORONARY ANGIOGRAPHY N/A 03/31/2019   Procedure: RIGHT/LEFT HEART CATH AND CORONARY ANGIOGRAPHY;  Surgeon: Sherren Mocha, MD;  Location: [place] [facility] LAB;  Service: Cardiovascular;  Laterality: N/A;  . SPINE SURGERY     Dr Carloyn Manner -  vertebraplasty     Current Outpatient Medications  Medication Sig Dispense Refill  . acetaminophen (TYLENOL) 500 MG tablet Take 500 mg by mouth every 8 (eight) hours as needed for mild pain, moderate pain or headache.     . albuterol (PROVENTIL HFA;VENTOLIN HFA) 108 (90 Base) MCG/ACT inhaler Inhale 2 puffs into the lungs every 6 (six) hours as needed for wheezing or shortness of breath (cough). 1 Inhaler 0  . ALPRAZolam (XANAX) 0.5 MG tablet Take 1 tablet (0.5 mg total) by mouth 2 (two) times daily as needed for anxiety. 60 tablet 3  . apixaban (ELIQUIS) 5 MG TABS tablet Take 1 tablet (5 mg total) by mouth 2 (two) times daily. Restart on 08/01/19 60 tablet   . bisoprolol (ZEBETA) 5 MG tablet Take 0.5 tablets (2.5 mg total) by mouth daily. 30 tablet 6  . cholecalciferol (VITAMIN D) 1000 UNITS tablet Take 2,000-4,000 Units by mouth daily. Take 2000 units daily except take 4000 units on Saturday and Sunday.    . escitalopram (LEXAPRO) 20 MG tablet TAKE ONE TABLET AT BEDTIME 30 tablet 3  . furosemide (LASIX) 20 MG tablet Take 1 tablet (20 mg total) by  mouth daily. 90 tablet 3  . Multiple Vitamin (MULTIVITAMIN WITH MINERALS) TABS tablet Take 1 tablet by mouth daily. Centrum Silver    . pantoprazole (PROTONIX) 40 MG tablet Take 1 tablet (40 mg total) by mouth 2 (two) timesdaily before a meal. (Patient taking differently: Take 40 mg by mouth 2 (two) times daily. ) 60 tablet 5  . potassium chloride SA (K-DUR) 20 MEQ tablet Take 1 tablet (20 mEq total) by mouth daily for 30 days. 30 tablet 1  . vitamin B-12 (CYANOCOBALAMIN) 1000 MCG tablet Take 1,000 mcg by mouth daily.     No current facility-administered medications for this visit.     ROS:  Please see the history of present illness.   Otherwise, review of systems are positive for none.   All other systems are reviewed and  negative.    PHYSICAL EXAM: VS:  BP (!) 99/59   Pulse 76   Ht 5' 7"  (1.702 m)   Wt 117 lb (53.1 kg)   BMI 18.32 kg/m  , BMI Body mass index is 18.32 kg/m. GENERAL: Frail appearing NECK:  No jugular venous distention, waveform within normal limits, carotid upstroke brisk and symmetric, no bruits, no thyromegaly LUNGS:  Clear to auscultation bilaterally CHEST:  Unremarkable HEART:  PMI not displaced or sustained,S1 and S2 within normal limits, no S3, no S4, no clicks, no rubs, no murmurs ABD:  Flat, positive bowel sounds normal in frequency in pitch, no bruits, no rebound, no guarding, no midline pulsatile mass, no hepatomegaly, no splenomegaly EXT:  2 plus pulses throughout, no edema, no cyanosis no clubbing   EKG:  EKG is not ordered today.     Recent Labs: 04/02/2019: Magnesium 2.1 07/09/2019: TSH 2.340 07/10/2019: B Natriuretic Peptide 365.0 07/20/2019: ALT 6 07/29/2019: BUN 16; Creatinine, Ser 0.93; Potassium 3.8; Sodium 140 07/30/2019: Hemoglobin 8.1; Platelets 248    Lipid Panel    Component Value Date/Time   CHOL 125 07/09/2019 1256   TRIG 101 07/09/2019 1256   TRIG 72 01/09/2015 0948   HDL 45 07/09/2019 1256   HDL 52 01/09/2015 0948   CHOLHDL 2.8  07/09/2019 1256   CHOLHDL 2.7 03/26/2019 0224   VLDL 13 03/26/2019 0224   LDLCALC 60 07/09/2019 1256   LDLCALC 73 08/25/2014 0943      Wt Readings from Last 3 Encounters:  08/04/19 117 lb (53.1 kg)  07/30/19 118 lb 6.2 oz (53.7 kg)  07/21/19 117 lb (53.1 kg)      Other studies Reviewed: Additional studies/ records that were reviewed today include: Review of hospital records. Review of the above records demonstrates:  Please see elsewhere in the note.     ASSESSMENT AND PLAN:  CARDIOMYOPATHY:     Her blood pressure is really low and she is quite weak.  At the last visit I stopped her Cozaar today and he reduce her bisoprolol 2.5 mg daily.  MR: This was moderate on echo.  I will follow this clinically and with repeat studies in the future.   ATRIAL FIB:    Misty [name] has a CHA2DS2 - VASc score of 5.    We talked at length about this.  She is quite bright and understands risks and benefits.  I think she needs to continue on anticoagulation unless she has an absolute contraindication.  She might need occasional blood transfusions and to be close follow-up of her CBC.  Check it again on Monday.  She is to continue to follow with GI and might have another capsule endoscopy.  ACUTE SYSTOLIC HF:  She seems to be euvolemic.  Blood transfusions if required to be given slowly.  MILDLY ELEVATED TROPONIN:    This was likely secondary to demand ischemia.  She is not having any active chest pain.  She has had mild nonobstructive coronary disease on recent chest.  TOBACCO USE:  We talked about this and she is cutting back but does not want other therapy.    Current medicines are reviewed at length with the patient today.  The patient does not have concerns regarding medicines.  The following changes have been made:  no change  Labs/ tests ordered today include: None  Orders Placed This Encounter  Procedures  . CBC     Disposition:   FU with in one month.  Signed,  Minus Breeding, MD  08/04/2019 2:16 PM    [facility]

## 2019-08-04 ENCOUNTER — Ambulatory Visit (INDEPENDENT_AMBULATORY_CARE_PROVIDER_SITE_OTHER): Payer: Medicare Other | Admitting: Cardiology

## 2019-08-04 ENCOUNTER — Other Ambulatory Visit: Payer: Self-pay

## 2019-08-04 ENCOUNTER — Encounter: Payer: Self-pay | Admitting: Cardiology

## 2019-08-04 VITALS — BP 99/59 | HR 76 | Ht 67.0 in | Wt 117.0 lb

## 2019-08-04 DIAGNOSIS — Z79899 Other long term (current) drug therapy: Secondary | ICD-10-CM

## 2019-08-04 DIAGNOSIS — D62 Acute posthemorrhagic anemia: Secondary | ICD-10-CM | POA: Diagnosis not present

## 2019-08-04 DIAGNOSIS — I4891 Unspecified atrial fibrillation: Secondary | ICD-10-CM | POA: Diagnosis not present

## 2019-08-04 MED ORDER — BISOPROLOL FUMARATE 5 MG PO TABS: 2.5000 mg | ORAL_TABLET | Freq: Every day | ORAL | 6 refills | Status: DC

## 2019-08-04 NOTE — Patient Instructions (Signed)
Medication Instructions:  Please decrease your Bisoprolol to 2.5 mg daily (1/2 tablet) Continue all other medications as listed.  If you need a refill on your cardiac medications before your next appointment, please call your pharmacy.   Lab work: Please have blood work on Monday to check your CBC at [facility].  If you have labs (blood work) drawn today and your tests are completely normal, you will receive your results only by: Marland Kitchen MyChart Message (if you have MyChart) OR . A paper copy in the mail If you have any lab test that is abnormal or we need to change your treatment, we will call you to review the results.  Follow-Up: Follow up with Dr Percival Spanish in 2 weeks.  Thank you for choosing [facility]!!

## 2019-08-09 ENCOUNTER — Other Ambulatory Visit: Payer: Medicare Other

## 2019-08-09 ENCOUNTER — Other Ambulatory Visit: Payer: Self-pay

## 2019-08-09 DIAGNOSIS — Z79899 Other long term (current) drug therapy: Secondary | ICD-10-CM | POA: Diagnosis not present

## 2019-08-09 DIAGNOSIS — I4891 Unspecified atrial fibrillation: Secondary | ICD-10-CM | POA: Diagnosis not present

## 2019-08-09 DIAGNOSIS — D62 Acute posthemorrhagic anemia: Secondary | ICD-10-CM

## 2019-08-10 ENCOUNTER — Telehealth: Payer: Self-pay | Admitting: *Deleted

## 2019-08-10 ENCOUNTER — Telehealth: Payer: Self-pay | Admitting: Cardiology

## 2019-08-10 ENCOUNTER — Other Ambulatory Visit: Payer: Self-pay

## 2019-08-10 ENCOUNTER — Encounter (HOSPITAL_COMMUNITY): Payer: Self-pay | Admitting: Emergency Medicine

## 2019-08-10 ENCOUNTER — Inpatient Hospital Stay (HOSPITAL_COMMUNITY)
Admission: EM | Admit: 2019-08-10 | Discharge: 2019-08-12 | DRG: 378 | Disposition: A | Discharge status: Home / Self Care | Payer: Medicare Other | Source: Ambulatory Visit | Attending: Internal Medicine | Admitting: Internal Medicine

## 2019-08-10 DIAGNOSIS — Z79899 Other long term (current) drug therapy: Secondary | ICD-10-CM | POA: Diagnosis not present

## 2019-08-10 DIAGNOSIS — F17200 Nicotine dependence, unspecified, uncomplicated: Secondary | ICD-10-CM | POA: Diagnosis not present

## 2019-08-10 DIAGNOSIS — F17210 Nicotine dependence, cigarettes, uncomplicated: Secondary | ICD-10-CM | POA: Diagnosis present

## 2019-08-10 DIAGNOSIS — I5022 Chronic systolic (congestive) heart failure: Secondary | ICD-10-CM | POA: Diagnosis present

## 2019-08-10 DIAGNOSIS — Z8619 Personal history of other infectious and parasitic diseases: Secondary | ICD-10-CM

## 2019-08-10 DIAGNOSIS — F329 Major depressive disorder, single episode, unspecified: Secondary | ICD-10-CM | POA: Diagnosis present

## 2019-08-10 DIAGNOSIS — K900 Celiac disease: Secondary | ICD-10-CM | POA: Diagnosis present

## 2019-08-10 DIAGNOSIS — K3189 Other diseases of stomach and duodenum: Secondary | ICD-10-CM | POA: Diagnosis present

## 2019-08-10 DIAGNOSIS — M4690 Unspecified inflammatory spondylopathy, site unspecified: Secondary | ICD-10-CM | POA: Diagnosis present

## 2019-08-10 DIAGNOSIS — D509 Iron deficiency anemia, unspecified: Secondary | ICD-10-CM | POA: Diagnosis present

## 2019-08-10 DIAGNOSIS — Z888 Allergy status to other drugs, medicaments and biological substances status: Secondary | ICD-10-CM

## 2019-08-10 DIAGNOSIS — Z885 Allergy status to narcotic agent status: Secondary | ICD-10-CM

## 2019-08-10 DIAGNOSIS — Z8249 Family history of ischemic heart disease and other diseases of the circulatory system: Secondary | ICD-10-CM

## 2019-08-10 DIAGNOSIS — Z886 Allergy status to analgesic agent status: Secondary | ICD-10-CM | POA: Diagnosis not present

## 2019-08-10 DIAGNOSIS — J8410 Pulmonary fibrosis, unspecified: Secondary | ICD-10-CM | POA: Diagnosis present

## 2019-08-10 DIAGNOSIS — K219 Gastro-esophageal reflux disease without esophagitis: Secondary | ICD-10-CM | POA: Diagnosis present

## 2019-08-10 DIAGNOSIS — Z8719 Personal history of other diseases of the digestive system: Secondary | ICD-10-CM | POA: Diagnosis not present

## 2019-08-10 DIAGNOSIS — Z881 Allergy status to other antibiotic agents status: Secondary | ICD-10-CM | POA: Diagnosis not present

## 2019-08-10 DIAGNOSIS — Z20828 Contact with and (suspected) exposure to other viral communicable diseases: Secondary | ICD-10-CM | POA: Diagnosis not present

## 2019-08-10 DIAGNOSIS — K922 Gastrointestinal hemorrhage, unspecified: Principal | ICD-10-CM | POA: Diagnosis present

## 2019-08-10 DIAGNOSIS — K766 Portal hypertension: Secondary | ICD-10-CM | POA: Diagnosis present

## 2019-08-10 DIAGNOSIS — F419 Anxiety disorder, unspecified: Secondary | ICD-10-CM | POA: Diagnosis present

## 2019-08-10 DIAGNOSIS — D649 Anemia, unspecified: Secondary | ICD-10-CM

## 2019-08-10 DIAGNOSIS — Z7901 Long term (current) use of anticoagulants: Secondary | ICD-10-CM

## 2019-08-10 DIAGNOSIS — I480 Paroxysmal atrial fibrillation: Secondary | ICD-10-CM | POA: Diagnosis not present

## 2019-08-10 DIAGNOSIS — D62 Acute posthemorrhagic anemia: Secondary | ICD-10-CM | POA: Diagnosis present

## 2019-08-10 DIAGNOSIS — E785 Hyperlipidemia, unspecified: Secondary | ICD-10-CM | POA: Diagnosis present

## 2019-08-10 DIAGNOSIS — J449 Chronic obstructive pulmonary disease, unspecified: Secondary | ICD-10-CM | POA: Diagnosis present

## 2019-08-10 DIAGNOSIS — M810 Age-related osteoporosis without current pathological fracture: Secondary | ICD-10-CM | POA: Diagnosis present

## 2019-08-10 DIAGNOSIS — K8681 Exocrine pancreatic insufficiency: Secondary | ICD-10-CM | POA: Diagnosis present

## 2019-08-10 DIAGNOSIS — Z90710 Acquired absence of both cervix and uterus: Secondary | ICD-10-CM

## 2019-08-10 LAB — CBC
HCT: 22.7 % — ABNORMAL LOW (ref 36.0–46.0)
Hematocrit: 20.8 % — ABNORMAL LOW (ref 34.0–46.6)
Hemoglobin: 6.4 g/dL — CL (ref 11.1–15.9)
Hemoglobin: 6.4 g/dL — CL (ref 12.0–15.0)
MCH: 26.2 pg — ABNORMAL LOW (ref 26.6–33.0)
MCH: 25.9 pg — ABNORMAL LOW (ref 26.0–34.0)
MCHC: 30.8 g/dL — ABNORMAL LOW (ref 31.5–35.7)
MCHC: 28.2 g/dL — ABNORMAL LOW (ref 30.0–36.0)
MCV: 85 fL (ref 79–97)
MCV: 91.9 fL (ref 80.0–100.0)
Platelets: 254 10*3/uL (ref 150–450)
Platelets: 274 10*3/uL (ref 150–400)
RBC: 2.44 x10E6/uL — CL (ref 3.77–5.28)
RBC: 2.47 MIL/uL — ABNORMAL LOW (ref 3.87–5.11)
RDW: 14.5 % (ref 11.7–15.4)
RDW: 15.4 % (ref 11.5–15.5)
WBC: 9 10*3/uL (ref 3.4–10.8)
WBC: 8.8 10*3/uL (ref 4.0–10.5)
nRBC: 0 % (ref 0.0–0.2)

## 2019-08-10 LAB — COMPREHENSIVE METABOLIC PANEL
ALT: 10 U/L (ref 0–44)
AST: 15 U/L (ref 15–41)
Albumin: 3.6 g/dL (ref 3.5–5.0)
Alkaline Phosphatase: 110 U/L (ref 38–126)
Anion gap: 10 (ref 5–15)
BUN: 25 mg/dL — ABNORMAL HIGH (ref 8–23)
CO2: 26 mmol/L (ref 22–32)
Calcium: 8.6 mg/dL — ABNORMAL LOW (ref 8.9–10.3)
Chloride: 100 mmol/L (ref 98–111)
Creatinine, Ser: 1.03 mg/dL — ABNORMAL HIGH (ref 0.44–1.00)
GFR calc Af Amer: >60 mL/min (ref >60)
GFR calc non Af Amer: 52 mL/min — ABNORMAL LOW (ref >60)
Glucose, Bld: 126 mg/dL — ABNORMAL HIGH (ref 70–99)
Potassium: 4.3 mmol/L (ref 3.5–5.1)
Sodium: 136 mmol/L (ref 135–145)
Total Bilirubin: 0.2 mg/dL — ABNORMAL LOW (ref 0.3–1.2)
Total Protein: 6.8 g/dL (ref 6.5–8.1)

## 2019-08-10 LAB — SARS CORONAVIRUS 2 (TAT 6-24 HRS): SARS Coronavirus 2: NEGATIVE

## 2019-08-10 LAB — PREPARE RBC (CROSSMATCH)

## 2019-08-10 LAB — PROTIME-INR
INR: 1.4 — ABNORMAL HIGH (ref 0.8–1.2)
Prothrombin Time: 16.5 seconds — ABNORMAL HIGH (ref 11.4–15.2)

## 2019-08-10 LAB — HEMOGLOBIN AND HEMATOCRIT, BLOOD
HCT: 28.9 % — ABNORMAL LOW (ref 36.0–46.0)
Hemoglobin: 8.5 g/dL — ABNORMAL LOW (ref 12.0–15.0)

## 2019-08-10 MED ORDER — FUROSEMIDE 20 MG PO TABS
20.0000 mg | ORAL_TABLET | Freq: Every day | ORAL | Status: DC
  Administered 2019-08-11 – 2019-08-12 (×2): 20 mg via ORAL
  Filled 2019-08-10 (×2): qty 1

## 2019-08-10 MED ORDER — BISOPROLOL FUMARATE 5 MG PO TABS
2.5000 mg | ORAL_TABLET | Freq: Every day | ORAL | Status: DC
  Administered 2019-08-11 – 2019-08-12 (×2): 2.5 mg via ORAL
  Filled 2019-08-10 (×2): qty 1

## 2019-08-10 MED ORDER — ACETAMINOPHEN 325 MG PO TABS
650.0000 mg | ORAL_TABLET | Freq: Four times a day (QID) | ORAL | Status: DC | PRN
  Administered 2019-08-11 – 2019-08-12 (×3): 650 mg via ORAL
  Filled 2019-08-10 (×3): qty 2

## 2019-08-10 MED ORDER — ONDANSETRON HCL 4 MG/2ML IJ SOLN: 4.0000 mg | Freq: Four times a day (QID) | INTRAMUSCULAR | Status: DC | PRN

## 2019-08-10 MED ORDER — ONDANSETRON HCL 4 MG PO TABS: 4.0000 mg | ORAL_TABLET | Freq: Four times a day (QID) | ORAL | Status: DC | PRN

## 2019-08-10 MED ORDER — FUROSEMIDE 10 MG/ML IJ SOLN
20.0000 mg | Freq: Once | INTRAMUSCULAR | Status: AC
  Administered 2019-08-10: 20 mg via INTRAVENOUS
  Filled 2019-08-10: qty 2

## 2019-08-10 MED ORDER — SODIUM CHLORIDE 0.9 % IV SOLN: 10.0000 mL/h | Freq: Once | INTRAVENOUS | Status: DC

## 2019-08-10 MED ORDER — POLYETHYLENE GLYCOL 3350 17 G PO PACK: 17.0000 g | PACK | Freq: Every day | ORAL | Status: DC | PRN

## 2019-08-10 MED ORDER — ACETAMINOPHEN 650 MG RE SUPP: 650.0000 mg | Freq: Four times a day (QID) | RECTAL | Status: DC | PRN

## 2019-08-10 MED ORDER — SODIUM CHLORIDE 0.9 % IV BOLUS
1000.0000 mL | Freq: Once | INTRAVENOUS | Status: AC
  Administered 2019-08-10: 16:00:00 1000 mL via INTRAVENOUS

## 2019-08-10 MED ORDER — PANTOPRAZOLE SODIUM 40 MG IV SOLR
40.0000 mg | INTRAVENOUS | Status: DC
  Administered 2019-08-10 – 2019-08-11 (×2): 40 mg via INTRAVENOUS
  Filled 2019-08-10 (×2): qty 40

## 2019-08-10 MED ORDER — ALPRAZOLAM 0.5 MG PO TABS
0.5000 mg | ORAL_TABLET | Freq: Two times a day (BID) | ORAL | Status: DC | PRN
  Administered 2019-08-10 – 2019-08-12 (×3): 0.5 mg via ORAL
  Filled 2019-08-10 (×4): qty 1

## 2019-08-10 MED ORDER — ALBUTEROL SULFATE (2.5 MG/3ML) 0.083% IN NEBU: 3.0000 mL | INHALATION_SOLUTION | Freq: Four times a day (QID) | RESPIRATORY_TRACT | Status: DC | PRN

## 2019-08-10 MED ORDER — ESCITALOPRAM OXALATE 10 MG PO TABS
20.0000 mg | ORAL_TABLET | Freq: Every day | ORAL | Status: DC
  Administered 2019-08-10 – 2019-08-11 (×2): 20 mg via ORAL
  Filled 2019-08-10 (×2): qty 2

## 2019-08-10 NOTE — ED Notes (Signed)
CRITICAL VALUE ALERT  Critical Value:  Hgb 6.4  Date & Time Notied:  08/10/2019, 1600  Provider Notified: Dr. Roderic Palau  Orders Received/Actions taken: none

## 2019-08-10 NOTE — Progress Notes (Signed)
Patient received one unit of PRBC's in emergency department. Clarified with Dr. Denton Brick, no other blood transfusion ordered at this time.

## 2019-08-10 NOTE — Telephone Encounter (Signed)
[facility] called with panic value results on this [facility] patient.    Hgb - 6.4 RBC - 2.44  Looks like lab is showing in Epic, but asked her to fax to you guys at [facility] since that is where [place] is.

## 2019-08-10 NOTE — H&P (Addendum)
History and Physical    Misty [name] WPY:[id number] DOB: [birth year] DOA: 08/10/2019  PCP: Baruch Gouty, FNP   Patient coming from: Home  I have personally briefly reviewed patient's old medical records in [place]  Chief Complaint: Low hemoglobin  HPI: Misty [name] is a 78 y.o. female with medical history significant for GI bleed, paroxysmal atrial fibrillation, anxiety and depression, systolic CHF, pulmonary fibrosis, ischemic colitis.  Patient was sent to the ED with hemoglobin of 6.4, for blood transfusion.  This is patient's third hospitalization for same over the past month. Patient denies difficulty breathing, no cough, no chest pain, no lightheadedness/dizziness.  Reports that she has never had black stools or vomiting of blood.  No abdominal pain.  Patient is on Eliquis for atrial fibrillation, her last dose was yesterday 9/21 morning.  She was told by her cardiologist not to take her Eliquis today.  Recent hospitalizations 8/22-8/25, patient had EGD 8/24 which showed grade a esophagitis and portal hypertensive gastropathy, patient also had colonoscopy which showed colonic diverticulosis.  She received 2 units of PRBC.  Subsequent hospitalization again 9/9 to 9/11, presenting hemoglobin 6.8, patient was transfused 1 unit PRBC.  GI was consulted capsule endoscopy was initiated but it was unsuccessful as the [place] capsule remained in the stomach for the duration of the study.  There was limited view of gastric mucosa because of presence of food debris in the stomach.  Small bowel mucosa could not be evaluated.  Patient was discharged home to resume her Eliquis 2 days later 9/13 which she did.  ED Course:.  Stable vitals.  Hemoglobin 6.4.  Down from 8.1  Eleven days ago.  Blood ordered for transfusion in the ED.  1 L bolus normal saline also given.  Review of Systems: As per HPI all other systems reviewed and negative.  Past Medical History:  Diagnosis Date  . Anxiety     takes Xanax daily  . Arthritis    back  . Atrial fibrillation with RVR ([place]) 03/25/2019  . Cataract   . Chronic back pain   . Constipation    OTC stool softener prn  . COPD (chronic obstructive pulmonary disease) ([place])   . Depression   . Diverticulosis   . GERD (gastroesophageal reflux disease)    takes Ranidine daily  . Hemorrhoids   . History of bronchitis   . History of migraine    many yrs ago  . History of shingles   . Hyperlipidemia   . Insomnia   . Migraines   . Osteoporosis   . PONV (postoperative nausea and vomiting)     Past Surgical History:  Procedure Laterality Date  . ABDOMINAL HYSTERECTOMY    . BIOPSY  07/12/2019   Procedure: BIOPSY;  Surgeon: Rogene Houston, MD;  [facility];  Service: Endoscopy;;  duodenum  . COLONOSCOPY    . COLONOSCOPY N/A 02/17/2015   Procedure: COLONOSCOPY;  Surgeon: Rogene Houston, MD;  [facility];  Service: Endoscopy;  Laterality: N/A;  110n - moved to 11:15 - Ann to notify pt  . COLONOSCOPY N/A 07/12/2019   Procedure: COLONOSCOPY;  Surgeon: Rogene Houston, MD;  [facility];  Service: Endoscopy;  Laterality: N/A;  . ESOPHAGOGASTRODUODENOSCOPY    . ESOPHAGOGASTRODUODENOSCOPY N/A 01/29/2016   Procedure: ESOPHAGOGASTRODUODENOSCOPY (EGD);  Surgeon: Rogene Houston, MD;  [facility];  Service: Endoscopy;  Laterality: N/A;  . ESOPHAGOGASTRODUODENOSCOPY N/A 10/14/2017   Procedure: ESOPHAGOGASTRODUODENOSCOPY (EGD);  Surgeon: Rogene Houston, MD;  [facility];  Service: Endoscopy;  Laterality: N/A;  730  . ESOPHAGOGASTRODUODENOSCOPY N/A 07/12/2019   Procedure: ESOPHAGOGASTRODUODENOSCOPY (EGD);  Surgeon: Rogene Houston, MD;  [facility];  Service: Endoscopy;  Laterality: N/A;  . EYE SURGERY Right 3/15   cataracts  . GIVENS CAPSULE STUDY N/A 07/29/2019   Procedure: GIVENS CAPSULE STUDY;  Surgeon: Rogene Houston, MD;  [facility];  Service: Endoscopy;   Laterality: N/A;  . INTRAMEDULLARY (IM) NAIL INTERTROCHANTERIC Left 01/07/2019   Procedure: INTRAMEDULLARY (IM) NAIL INTERTROCHANTRIC;  Surgeon: Carole Civil, MD;  [facility];  Service: Orthopedics;  Laterality: Left;  . KYPHOPLASTY N/A 07/08/2014   Procedure: Thoracic Eleven Kyphoplasty;  Surgeon: Hosie Spangle, MD;  Location: [place] NEURO ORS;  Service: Neurosurgery;  Laterality: N/A;  . left hip surgery    . LUMBAR LAMINECTOMY/DECOMPRESSION MICRODISCECTOMY Bilateral 05/20/2013   Procedure: LUMBAR LAMINECTOMY/DECOMPRESSION MICRODISCECTOMY 1 LEVEL;  Surgeon: Hosie Spangle, MD;  Location: [place] [facility]O ORS;  Service: Neurosurgery;  Laterality: Bilateral;  Bilateral Lumbar four-five laminotomy and left lumbar four-five microdiskectomy  . RIGHT/LEFT HEART CATH AND CORONARY ANGIOGRAPHY N/A 03/31/2019   Procedure: RIGHT/LEFT HEART CATH AND CORONARY ANGIOGRAPHY;  Surgeon: Sherren Mocha, MD;  Location: [place] CV LAB;  Service: Cardiovascular;  Laterality: N/A;  . SPINE SURGERY     Dr Carloyn Manner -  vertebraplasty     reports that she has been smoking cigarettes. She started smoking about 60 years ago. She has a 52.00 pack-year smoking history. She has never used smokeless tobacco. She reports that she does not drink alcohol or use drugs.  Allergies  Allergen Reactions  . Codeine Nausea And Vomiting  . Tramadol Nausea And Vomiting  . Asa [Aspirin] Other (See Comments)    Patient is unaware of allergy  . Azithromycin Other (See Comments)    Patient is unaware of allergy  . Celebrex [Celecoxib] Other (See Comments)    Irritated stomach   . Cymbalta [Duloxetine Hcl] Other (See Comments)    Felt groggy  . Prednisone Rash    She has seen the podiatrist and he had injected cortisone in her feet and she had also taken a peel at home. She had a severe reaction to her face with a rash and had to take Benadryl to resolve the symptom. She actually did get more cortisone shots, but no additional  reactions to the shots.  . Vioxx [Rofecoxib] Other (See Comments)    Unknown  . Zelnorm [Tegaserod] Other (See Comments)    Patient is unaware of allergy  . Zocor [Simvastatin] Other (See Comments)    Patient is unaware of allergy    Family History  Problem Relation Age of Onset  . Hypertension Mother   . Heart attack Mother   . Macular degeneration Father   . Heart disease Father   . Cirrhosis Son   . Heart disease Son   . Colon cancer Neg Hx     Prior to Admission medications   Medication Sig Start Date End Date Taking? Authorizing Provider  acetaminophen (TYLENOL) 500 MG tablet Take 500 mg by mouth every 8 (eight) hours as needed for mild pain, moderate pain or headache.  01/20/19  Yes [provider]  albuterol (PROVENTIL HFA;VENTOLIN HFA) 108 (90 Base) MCG/ACT inhaler Inhale 2 puffs into the lungs every 6 (six) hours as needed for wheezing or shortness of breath (cough). 01/28/19  Yes Gerlene Fee, NP  ALPRAZolam (  XANAX) 0.5 MG tablet Take 1 tablet (0.5 mg total) by mouth 2 (two) times daily as needed for anxiety. Patient taking differently: Take 0.5 mg by mouth 2 (two) times daily.  07/24/19 08/23/19 Yes Rakes, Connye Burkitt, FNP  apixaban (ELIQUIS) 5 MG TABS tablet Take 1 tablet (5 mg total) by mouth 2 (two) times daily. Restart on 08/01/19 08/01/19  Yes Tat, Shanon Brow, MD  bisoprolol (ZEBETA) 5 MG tablet Take 0.5 tablets (2.5 mg total) by mouth daily. 08/04/19  Yes Minus Breeding, MD  cholecalciferol (VITAMIN D) 1000 UNITS tablet Take 2,000 Units by mouth daily.    Yes [provider]  escitalopram (LEXAPRO) 20 MG tablet TAKE ONE TABLET AT BEDTIME Patient taking differently: Take 20 mg by mouth at bedtime.  06/24/19  Yes Rehman, Mechele Dawley, MD  furosemide (LASIX) 20 MG tablet Take 1 tablet (20 mg total) by mouth daily. 04/07/19  Yes Minus Breeding, MD  Multiple Vitamin (MULTIVITAMIN WITH MINERALS) TABS tablet Take 1 tablet by mouth daily. Centrum Silver   Yes [provider]  pantoprazole (PROTONIX) 40 MG tablet Take 1 tablet (40 mg total) by mouth 2 (two) timesdaily before a meal. Patient taking differently: Take 40 mg by mouth 2 (two) times daily.  05/24/19  Yes Rehman, Mechele Dawley, MD  potassium chloride SA (K-DUR) 20 MEQ tablet Take 1 tablet (20 mEq total) by mouth daily for 30 days. 04/19/19 08/10/19 Yes Rakes, Connye Burkitt, FNP  vitamin B-12 (CYANOCOBALAMIN) 1000 MCG tablet Take 1,000 mcg by mouth daily.   Yes [provider]    Physical Exam: Vitals:   08/10/19 1913 08/10/19 1930 08/10/19 2039 08/10/19 2040  BP: 130/62 136/71 (!) 137/112 (!) 132/58  Pulse: 89 83 81 78  Resp: 17 (!) 28 (!) 25 16  Temp: 99.3 F (37.4 C)  98.5 F (36.9 C)   TempSrc: Oral  Oral   SpO2: 95% 95% 96% 96%  Weight:      Height:        Constitutional: NAD, calm, comfortable Vitals:   08/10/19 1913 08/10/19 1930 08/10/19 2039 08/10/19 2040  BP: 130/62 136/71 (!) 137/112 (!) 132/58  Pulse: 89 83 81 78  Resp: 17 (!) 28 (!) 25 16  Temp: 99.3 F (37.4 C)  98.5 F (36.9 C)   TempSrc: Oral  Oral   SpO2: 95% 95% 96% 96%  Weight:      Height:       Eyes: PERRL, lids and conjunctivae normal ENMT: Mucous membranes are moist. Posterior pharynx clear of any exudate or lesions.Normal dentition.  Neck: normal, supple, no masses, no thyromegaly Respiratory: clear to auscultation bilaterally, no wheezing, crackles present . Normal respiratory effort. No accessory muscle use.  Cardiovascular: Regular rate and rhythm, no murmurs / rubs / gallops. No extremity edema. 2+ pedal pulses. No carotid bruits.  Abdomen: no tenderness, no masses palpated. No hepatosplenomegaly. Bowel sounds positive.  Musculoskeletal: no clubbing / cyanosis. No joint deformity upper and lower extremities. Good ROM, no contractures. Normal muscle tone.  Skin: no rashes, lesions, ulcers. No induration Neurologic: CN 2-12 grossly intact. Strength 5/5 in all 4.  Psychiatric: Normal judgment and  insight. Alert and oriented x 3. Normal mood.   Labs on Admission: I have personally reviewed following labs and imaging studies  CBC: Recent Labs  Lab 08/09/19 1425 08/10/19 1509  WBC 9.0 8.8  HGB 6.4* 6.4*  HCT 20.8* 22.7*  MCV 85 91.9  PLT 254 885   Basic Metabolic Panel: Recent  Labs  Lab 08/10/19 1509  NA 136  K 4.3  CL 100  CO2 26  GLUCOSE 126*  BUN 25*  CREATININE 1.03*  CALCIUM 8.6*   Liver Function Tests: Recent Labs  Lab 08/10/19 1509  AST 15  ALT 10  ALKPHOS 110  BILITOT 0.2*  PROT 6.8  ALBUMIN 3.6   Coagulation Profile: Recent Labs  Lab 08/10/19 1509  INR 1.4*    Radiological Exams on Admission: No results found.  EKG: None.  Assessment/Plan Active Problems:   Acute anemia    Acute anemia-hemoglobin 6.4.  Recurrent.  Third admission in the past 1 months for same, requiring transfusions.  On Eliquis for stroke prophylaxis for fibrillation.  Last dose Eliquis 08/09/2019.  Recent EGD and colonoscopy-grade A esophagitis, portal hypertensive gastropathy, colonic diverticulosis.  Unsuccessful capsule endoscopy. No obvious GI blood loss identified. - Transfuse 1 unit PRBC for now. - Consider ordering 2nd unit PRBC for transfusion in a.m -IV Lasix 20 mg x 1 -Gastroenterology consult placed -Monitor hemoglobin closely -N.p.o. midnight -Hold home Eliquis -IV Protonix 40 mg daily -Cautious with transfusion with low EF, as patient has received 1 L normal saline bolus and 1 unit of PRBC.  Atrial fibrillation on anticoagulation with Eliquis-rate controlled. CHADsVASC score- 5. -Hold home Eliquis -Resume home bisoprolol.  Chronic systolic congestive heart failure;  stable and compensated.  Echo 03/2019 shows EF 30 to 35% -Resume home bisoprolol, Lasix.  Depression/anxiety -Continue Lexapro and as needed Xanax  Pulmonary fibrosis-stable.  Currently on room air.  DVT prophylaxis: SCDs Code Status: Full code Family Communication: None at  bedside  disposition Plan: Per rounding team Consults called: Gastro enterology Admission status: Obseveration, telemetry   Bethena Roys MD [facility]  08/10/2019, 9:38 PM

## 2019-08-10 NOTE — ED Notes (Signed)
ED TO INPATIENT HANDOFF REPORT  ED Nurse Name and Phone #: [phone]  S Name/Age/Gender Misty [name] 78 y.o. female Room/Bed: APA15/APA15  Code Status   Code Status: Prior  Home/SNF/Other Home Patient oriented to: situation Is this baseline? Yes   Triage Complete: Triage complete  Chief Complaint HEMOGLOBIN LOW  Triage Note Pt sent over by PCP for Hgb of 6.0. Pt states that this happened and few weeks ago and she had to get a unit of blood. Denies rectal bleeding or dark stools. Pt denies abdominal pain. Pt also c/o SOB which she states happens when her Hgb drops. Pt pale. Pt is on eliquis and she was instructed not to take it this morning.   Allergies Allergies  Allergen Reactions  . Codeine Nausea And Vomiting  . Tramadol Nausea And Vomiting  . Asa [Aspirin] Other (See Comments)    Patient is unaware of allergy  . Azithromycin Other (See Comments)    Patient is unaware of allergy  . Celebrex [Celecoxib] Other (See Comments)    Irritated stomach   . Cymbalta [Duloxetine Hcl] Other (See Comments)    Felt groggy  . Prednisone Rash    She has seen the podiatrist and he had injected cortisone in her feet and she had also taken a peel at home. She had a severe reaction to her face with a rash and had to take Benadryl to resolve the symptom. She actually did get more cortisone shots, but no additional reactions to the shots.  . Vioxx [Rofecoxib] Other (See Comments)    Unknown  . Zelnorm [Tegaserod] Other (See Comments)    Patient is unaware of allergy  . Zocor [Simvastatin] Other (See Comments)    Patient is unaware of allergy    Level of Care/Admitting Diagnosis ED Disposition    ED Disposition Condition Comment   Admit  Hospital Area: [facility] [[id number]]  Level of Care: Telemetry [5]  Covid Evaluation: Asymptomatic Screening Protocol (No Symptoms)  Diagnosis: Acute anemia [5809983]  Admitting Physician: Onnie Boer [id number]  Attending  Physician: Onnie Boer Xenia.Douglas  PT Class (Do Not Modify): Observation [104]  PT Acc Code (Do Not Modify): Observation [10022]       B Medical/Surgery History Past Medical History:  Diagnosis Date  . Anxiety    takes Xanax daily  . Arthritis    back  . Atrial fibrillation with RVR (HCC) 03/25/2019  . Cataract   . Chronic back pain   . Constipation    OTC stool softener prn  . COPD (chronic obstructive pulmonary disease) (HCC)   . Depression   . Diverticulosis   . GERD (gastroesophageal reflux disease)    takes Ranidine daily  . Hemorrhoids   . History of bronchitis   . History of migraine    many yrs ago  . History of shingles   . Hyperlipidemia   . Insomnia   . Migraines   . Osteoporosis   . PONV (postoperative nausea and vomiting)    Past Surgical History:  Procedure Laterality Date  . ABDOMINAL HYSTERECTOMY    . BIOPSY  07/12/2019   Procedure: BIOPSY;  Surgeon: Malissa Hippo, MD;  [facility];  Service: Endoscopy;;  duodenum  . COLONOSCOPY    . COLONOSCOPY N/A 02/17/2015   Procedure: COLONOSCOPY;  Surgeon: Malissa Hippo, MD;  [facility]: [facility];  Service: Endoscopy;  Laterality: N/A;  110n - moved to 11:15 - Ann to notify pt  .  COLONOSCOPY N/A 07/12/2019   Procedure: COLONOSCOPY;  Surgeon: Rogene Houston, MD;  [facility];  Service: Endoscopy;  Laterality: N/A;  . ESOPHAGOGASTRODUODENOSCOPY    . ESOPHAGOGASTRODUODENOSCOPY N/A 01/29/2016   Procedure: ESOPHAGOGASTRODUODENOSCOPY (EGD);  Surgeon: Rogene Houston, MD;  [facility];  Service: Endoscopy;  Laterality: N/A;  . ESOPHAGOGASTRODUODENOSCOPY N/A 10/14/2017   Procedure: ESOPHAGOGASTRODUODENOSCOPY (EGD);  Surgeon: Rogene Houston, MD;  [facility];  Service: Endoscopy;  Laterality: N/A;  730  . ESOPHAGOGASTRODUODENOSCOPY N/A 07/12/2019   Procedure: ESOPHAGOGASTRODUODENOSCOPY (EGD);  Surgeon: Rogene Houston, MD;  [facility];  Service:  Endoscopy;  Laterality: N/A;  . EYE SURGERY Right 3/15   cataracts  . GIVENS CAPSULE STUDY N/A 07/29/2019   Procedure: GIVENS CAPSULE STUDY;  Surgeon: Rogene Houston, MD;  [facility];  Service: Endoscopy;  Laterality: N/A;  . INTRAMEDULLARY (IM) NAIL INTERTROCHANTERIC Left 01/07/2019   Procedure: INTRAMEDULLARY (IM) NAIL INTERTROCHANTRIC;  Surgeon: Carole Civil, MD;  Location: [facility];  Service: Orthopedics;  Laterality: Left;  . KYPHOPLASTY N/A 07/08/2014   Procedure: Thoracic Eleven Kyphoplasty;  Surgeon: Hosie Spangle, MD;  Location: [place] [facility];  Service: Neurosurgery;  Laterality: N/A;  . left hip surgery    . LUMBAR LAMINECTOMY/DECOMPRESSION MICRODISCECTOMY Bilateral 05/20/2013   Procedure: LUMBAR LAMINECTOMY/DECOMPRESSION MICRODISCECTOMY 1 LEVEL;  Surgeon: Hosie Spangle, MD;  Location: [place] [facility];  Service: Neurosurgery;  Laterality: Bilateral;  Bilateral Lumbar four-five laminotomy and left lumbar four-five microdiskectomy  . RIGHT/LEFT HEART CATH AND CORONARY ANGIOGRAPHY N/A 03/31/2019   Procedure: RIGHT/LEFT HEART CATH AND CORONARY ANGIOGRAPHY;  Surgeon: Sherren Mocha, MD;  Location: [place] [facility];  Service: Cardiovascular;  Laterality: N/A;  . SPINE SURGERY     Dr Carloyn Manner -  vertebraplasty     A IV Location/Drains/Wounds Patient Lines/Drains/Airways Status   Active Line/Drains/Airways    Name:   Placement date:   Placement time:   Site:   Days:   Peripheral IV 08/10/19 Right Antecubital   08/10/19    1600    Antecubital   less than 1   Peripheral IV 08/10/19 Left Antecubital   08/10/19    1600    Antecubital   less than 1          Intake/Output Last 24 hours  Intake/Output Summary (Last 24 hours) at 08/10/2019 1906 Last data filed at 08/10/2019 1806 Gross per 24 hour  Intake 500 ml  Output -  Net 500 ml    Labs/Imaging Results for orders placed or performed during the hospital encounter of 08/10/19 (from the past 48 hour(s))   Comprehensive metabolic panel     Status: Abnormal   Collection Time: 08/10/19  3:09 PM  Result Value Ref Range   Sodium 136 135 - 145 mmol/L   Potassium 4.3 3.5 - 5.1 mmol/L   Chloride 100 98 - 111 mmol/L   CO2 26 22 - 32 mmol/L   Glucose, Bld 126 (H) 70 - 99 mg/dL   BUN 25 (H) 8 - 23 mg/dL   Creatinine, Ser 1.03 (H) 0.44 - 1.00 mg/dL   Calcium 8.6 (L) 8.9 - 10.3 mg/dL   Total Protein 6.8 6.5 - 8.1 g/dL   Albumin 3.6 3.5 - 5.0 g/dL   AST 15 15 - 41 U/L   ALT 10 0 - 44 U/L   Alkaline Phosphatase 110 38 - 126 U/L   Total Bilirubin 0.2 (L) 0.3 - 1.2 mg/dL   GFR  calc non Af Amer 52 (L) >60 mL/min   GFR calc Af Amer >60 >60 mL/min   Anion gap 10 5 - 15    Comment: Performed at [facility], [address] [place], [place] [address]  CBC     Status: Abnormal   Collection Time: 08/10/19  3:09 PM  Result Value Ref Range   WBC 8.8 4.0 - 10.5 K/uL   RBC 2.47 (L) 3.87 - 5.11 MIL/uL   Hemoglobin 6.4 (LL) 12.0 - 15.0 g/dL    Comment: REPEATED TO VERIFY THIS CRITICAL RESULT HAS VERIFIED AND BEEN CALLED TO MINTER,R BY HILLARY FLYNT ON 09 22 2020 AT 1558, AND HAS BEEN READ BACK.     HCT 22.7 (L) 36.0 - 46.0 %   MCV 91.9 80.0 - 100.0 fL   MCH 25.9 (L) 26.0 - 34.0 pg   MCHC 28.2 (L) 30.0 - 36.0 g/dL   RDW 60.415.4 54.011.5 - 98.115.5 %   Platelets 274 150 - 400 K/uL   nRBC 0.0 0.0 - 0.2 %    Comment: Performed at [facility], [address] [place], [place] [address]  Protime-INR - (order if Patient is taking Coumadin / Warfarin)     Status: Abnormal   Collection Time: 08/10/19  3:09 PM  Result Value Ref Range   Prothrombin Time 16.5 (H) 11.4 - 15.2 seconds   INR 1.4 (H) 0.8 - 1.2    Comment: (NOTE) INR goal varies based on device and disease states. Performed at [facility], [address] [place], [place] [address]   Type and screen [facility]     Status: None (Preliminary result)   Collection Time: 08/10/19  3:13 PM  Result Value Ref Range   ABO/RH(D) O POS    Antibody Screen  NEG    Sample Expiration 08/13/2019,2359    Unit Number [id number]    Blood Component Type RED CELLS,LR    Unit division 00    Status of Unit ALLOCATED    Transfusion Status OK TO TRANSFUSE    Crossmatch Result Compatible    Unit Number [id number]    Blood Component Type RED CELLS,LR    Unit division 00    Status of Unit ISSUED    Transfusion Status OK TO TRANSFUSE    Crossmatch Result      Compatible Performed at [facility], [address] [place], [place] [address]   Prepare RBC     Status: None   Collection Time: 08/10/19  3:13 PM  Result Value Ref Range   Order Confirmation      ORDER PROCESSED BY BLOOD BANK Performed at [facility], [address] [place], [place] [address]    No results found.  Pending Labs [employer] (From admission, onward)    Start     Ordered   08/10/19 1535  SARS CORONAVIRUS 2 (TAT 6-24 HRS) Nasopharyngeal Nasopharyngeal Swab  (Asymptomatic/Tier 2 Patients Labs)  Once,   STAT    Question Answer Comment  Is this test for diagnosis or screening Screening   Symptomatic for COVID-19 as defined by CDC No   Hospitalized for COVID-19 No   Admitted to ICU for COVID-19 No   Previously tested for COVID-19 Yes   Resident in a congregate (group) care setting No   Employed in healthcare setting No   Pregnant No      08/10/19 1535   Signed and Held  CBC  Tomorrow morning,   R     Signed and  Held   Signed and Held  Hemoglobin and hematocrit, blood  Once,   R    Comments: Post Transfusion    Signed and Held          Vitals/Pain Today's Vitals   08/10/19 1745 08/10/19 1800 08/10/19 1815 08/10/19 1830  BP: (!) 151/67 (!) 120/97 139/87 (!) 132/91  Pulse: 92 85    Resp: 20 (!) 27 16 (!) 25  Temp:      TempSrc:      SpO2: (!) 87% 94%    Weight:      Height:      PainSc:        Isolation Precautions No active isolations  Medications Medications  0.9 %  sodium chloride infusion (has no administration in time range)  sodium  chloride 0.9 % bolus 1,000 mL (0 mLs Intravenous Stopped 08/10/19 1806)  furosemide (LASIX) injection 20 mg (20 mg Intravenous Given 08/10/19 1841)    Mobility walks Low fall risk   Focused Assessments    R Recommendations: See Admitting Provider Note  Report given to:   Additional Notes: alert and oriented and uses bedside toilet

## 2019-08-10 NOTE — Telephone Encounter (Signed)
Advised patient of recommendations and to follow up with [facility]  Notes recorded by Minus Breeding, MD on 08/10/2019 at 9:51 AM EDT  I spoke with [facility]. The patient will need to be told to stop her Eliquis. Her primary care office is arranging for the patient to get a blood transfusion. She is to have follow up with GI and should have this and follow up labs before she starts back on any blood thinner.

## 2019-08-10 NOTE — Telephone Encounter (Signed)
I received a call this am from Dr Percival Spanish - cardio. He received lab work today that was drawn yesterday here at [facility]. HGB is 6.4 for pt. His office called the patient and had her to stop ELIQUIS. He would like [place] to order a blood transfusion for the pt. She was called and aware to go to [facility] for eval and blood. She is upset at the fact that she has had to do this several times now and no one can figure out why she keeps dropping. She states that she will go today, but can not guarantee what time it will be.  We did call to [facility] and speak with someone in Short Stay and they states that she couldn't come down there for transfusion only.

## 2019-08-10 NOTE — Telephone Encounter (Signed)
Minus Breeding, MD  Shellia Cleverly, RN        I spoke with [facility]. The patient will need to be told to stop her Eliquis. Her primary care office is arranging for the patient to get a blood transfusion. She is to have follow up with GI and should have this and follow up labs before she starts back on any blood thinner.    Called patient to review the above information.  NA at home phone number.  I do see a note from [facility], LPN where she had contacted the patient regarding being set up for a blood transfusion and pt was to report to [facility] for this.  Left message on pt's VM reminding her to not take anymore Eliquis at this time (per Dr Percival Spanish).

## 2019-08-10 NOTE — ED Provider Notes (Signed)
[facility] EMERGENCY DEPARTMENT Provider Note   CSN: [id number] Arrival date & time: 08/10/19  1418     History   Chief Complaint Chief Complaint  Patient presents with  . Abnormal Lab    HPI Misty [name] is a 78 y.o. female.     Patient complains of weakness.  She has a history of GI bleed and anemia.  She has been evaluated for the GI bleed but they cannot figure out what is coming from.  She does take Eliquis  The history is provided by the patient. No language interpreter was used.  Weakness Severity:  Moderate Onset quality:  Sudden Timing:  Constant Progression:  Worsening Chronicity:  Recurrent Context: not alcohol use   Relieved by:  Nothing Worsened by:  Nothing Ineffective treatments:  None tried Associated symptoms: no abdominal pain, no chest pain, no cough, no diarrhea, no frequency, no headaches and no seizures     Past Medical History:  Diagnosis Date  . Anxiety    takes Xanax daily  . Arthritis    back  . Atrial fibrillation with RVR (HCC) 03/25/2019  . Cataract   . Chronic back pain   . Constipation    OTC stool softener prn  . COPD (chronic obstructive pulmonary disease) (HCC)   . Depression   . Diverticulosis   . GERD (gastroesophageal reflux disease)    takes Ranidine daily  . Hemorrhoids   . History of bronchitis   . History of migraine    many yrs ago  . History of shingles   . Hyperlipidemia   . Insomnia   . Migraines   . Osteoporosis   . PONV (postoperative nausea and vomiting)     Patient Active Problem List   Diagnosis Date Noted  . Acute anemia 08/10/2019  . GI bleed 07/28/2019  . Gastroesophageal reflux disease with esophagitis 07/20/2019  . Paroxysmal atrial fibrillation (HCC) 07/20/2019  . Anemia 07/10/2019  . Aortic atherosclerosis (HCC) 07/09/2019  . Vitamin D deficiency 07/09/2019  . GAD (generalized anxiety disorder) 07/09/2019  . Depression, recurrent (HCC) 07/09/2019  . Controlled substance agreement  signed 07/09/2019  . Educated About Covid-19 Virus Infection 04/07/2019  . S/P thoracentesis   . Pleural effusion, right   . Acute systolic heart failure (HCC) 03/31/2019  . Lobar pneumonia (HCC)   . Acute hypoxemic respiratory failure (HCC)   . Community acquired pneumonia 03/26/2019  . Elevated troponin 03/25/2019  . S/P IM nail left hip 01/07/2019 01/26/2019  . H/O kyphoplasty 01/14/2019  . Iron deficiency anemia 01/14/2019  . Acute blood loss anemia 01/13/2019  . Constipation due to opioid therapy 01/13/2019  . Anxiety and depression 01/13/2019  . Hypokalemia 01/10/2019  . Displaced intertrochanteric fracture of left femur, sequela 01/06/2019  . Chronic midline low back pain without sciatica 10/09/2018  . Chronic midline thoracic back pain 10/09/2018  . Osteopenia determined by x-ray 04/16/2018  . Pulmonary fibrosis (HCC) 04/07/2018  . Non-intractable vomiting with nausea 10/06/2017  . Malnutrition of moderate degree 01/29/2016  . Symptomatic anemia 01/27/2016  . Generalized weakness 01/27/2016  . Abnormal chest x-ray 01/27/2016  . Tobacco use disorder 01/27/2016  . Colitis   . Hematochezia 02/26/2015  . Ischemic colitis (HCC) 02/26/2015  . Orthostatic hypotension 02/26/2015  . Nontraumatic compression fracture of T11 vertebra (HCC) 07/08/2014  . Hyperlipidemia 08/19/2013  . Fibrocystic breast disease 08/19/2013  . History of migraine headaches 08/19/2013  . GERD (gastroesophageal reflux disease) 04/07/2013  . Osteoporosis, postmenopausal 04/07/2013  Past Surgical History:  Procedure Laterality Date  . ABDOMINAL HYSTERECTOMY    . BIOPSY  07/12/2019   Procedure: BIOPSY;  Surgeon: Rogene Houston, MD;  [facility];  Service: Endoscopy;;  duodenum  . COLONOSCOPY    . COLONOSCOPY N/A 02/17/2015   Procedure: COLONOSCOPY;  Surgeon: Rogene Houston, MD;  Location: [facility];  Service: Endoscopy;  Laterality: N/A;  110n - moved to 11:15 - Ann to notify pt  .  COLONOSCOPY N/A 07/12/2019   Procedure: COLONOSCOPY;  Surgeon: Rogene Houston, MD;  [facility]: [facility];  Service: Endoscopy;  Laterality: N/A;  . ESOPHAGOGASTRODUODENOSCOPY    . ESOPHAGOGASTRODUODENOSCOPY N/A 01/29/2016   Procedure: ESOPHAGOGASTRODUODENOSCOPY (EGD);  Surgeon: Rogene Houston, MD;  [facility];  Service: Endoscopy;  Laterality: N/A;  . ESOPHAGOGASTRODUODENOSCOPY N/A 10/14/2017   Procedure: ESOPHAGOGASTRODUODENOSCOPY (EGD);  Surgeon: Rogene Houston, MD;  [facility]: [facility];  Service: Endoscopy;  Laterality: N/A;  730  . ESOPHAGOGASTRODUODENOSCOPY N/A 07/12/2019   Procedure: ESOPHAGOGASTRODUODENOSCOPY (EGD);  Surgeon: Rogene Houston, MD;  [facility]: [facility];  Service: Endoscopy;  Laterality: N/A;  . EYE SURGERY Right 3/15   cataracts  . GIVENS CAPSULE STUDY N/A 07/29/2019   Procedure: GIVENS CAPSULE STUDY;  Surgeon: Rogene Houston, MD;  [facility]: [facility];  Service: Endoscopy;  Laterality: N/A;  . INTRAMEDULLARY (IM) NAIL INTERTROCHANTERIC Left 01/07/2019   Procedure: INTRAMEDULLARY (IM) NAIL INTERTROCHANTRIC;  Surgeon: Carole Civil, MD;  Location: [facility];  Service: Orthopedics;  Laterality: Left;  . KYPHOPLASTY N/A 07/08/2014   Procedure: Thoracic Eleven Kyphoplasty;  Surgeon: Hosie Spangle, MD;  Location: [place] [facility];  Service: Neurosurgery;  Laterality: N/A;  . left hip surgery    . LUMBAR LAMINECTOMY/DECOMPRESSION MICRODISCECTOMY Bilateral 05/20/2013   Procedure: LUMBAR LAMINECTOMY/DECOMPRESSION MICRODISCECTOMY 1 LEVEL;  Surgeon: Hosie Spangle, MD;  Location: [place] [facility];  Service: Neurosurgery;  Laterality: Bilateral;  Bilateral Lumbar four-five laminotomy and left lumbar four-five microdiskectomy  . RIGHT/LEFT HEART CATH AND CORONARY ANGIOGRAPHY N/A 03/31/2019   Procedure: RIGHT/LEFT HEART CATH AND CORONARY ANGIOGRAPHY;  Surgeon: Sherren Mocha, MD;  Location: [facility];  Service: Cardiovascular;  Laterality: N/A;  .  SPINE SURGERY     Dr Carloyn Manner -  vertebraplasty     OB History   No obstetric history on file.      Home Medications    Prior to Admission medications   Medication Sig Start Date End Date Taking? Authorizing Provider  acetaminophen (TYLENOL) 500 MG tablet Take 500 mg by mouth every 8 (eight) hours as needed for mild pain, moderate pain or headache.  01/20/19   [provider]  albuterol (PROVENTIL HFA;VENTOLIN HFA) 108 (90 Base) MCG/ACT inhaler Inhale 2 puffs into the lungs every 6 (six) hours as needed for wheezing or shortness of breath (cough). 01/28/19   Gerlene Fee, NP  ALPRAZolam Duanne Moron) 0.5 MG tablet Take 1 tablet (0.5 mg total) by mouth 2 (two) times daily as needed for anxiety. 07/24/19 08/23/19  Baruch Gouty, FNP  apixaban (ELIQUIS) 5 MG TABS tablet Take 1 tablet (5 mg total) by mouth 2 (two) times daily. Restart on 08/01/19 08/01/19   Orson Eva, MD  bisoprolol (ZEBETA) 5 MG tablet Take 0.5 tablets (2.5 mg total) by mouth daily. 08/04/19   Minus Breeding, MD  cholecalciferol (VITAMIN D) 1000 UNITS tablet Take 2,000-4,000 Units by mouth daily. Take 2000 units daily except take 4000 units on Saturday and Sunday.  [provider]  escitalopram (LEXAPRO) 20 MG tablet TAKE ONE TABLET AT BEDTIME 06/24/19   Rehman, Joline Maxcy, MD  furosemide (LASIX) 20 MG tablet Take 1 tablet (20 mg total) by mouth daily. 04/07/19   Rollene Rotunda, MD  Multiple Vitamin (MULTIVITAMIN WITH MINERALS) TABS tablet Take 1 tablet by mouth daily. Centrum Silver    [provider]  pantoprazole (PROTONIX) 40 MG tablet Take 1 tablet (40 mg total) by mouth 2 (two) timesdaily before a meal. Patient taking differently: Take 40 mg by mouth 2 (two) times daily.  05/24/19   Rehman, Joline Maxcy, MD  potassium chloride SA (K-DUR) 20 MEQ tablet Take 1 tablet (20 mEq total) by mouth daily for 30 days. 04/19/19 08/04/19  Sonny Masters, FNP  vitamin B-12 (CYANOCOBALAMIN) 1000 MCG tablet Take 1,000 mcg by mouth  daily.    [provider]    Family History Family History  Problem Relation Age of Onset  . Hypertension Mother   . Heart attack Mother   . Macular degeneration Father   . Heart disease Father   . Cirrhosis Son   . Heart disease Son   . Colon cancer Neg Hx     Social History Social History   Tobacco Use  . Smoking status: Current Some Day Smoker    Packs/day: 1.00    Years: 52.00    Pack years: 52.00    Types: Cigarettes    Start date: 11/18/1958    Last attempt to quit: 08/20/2011    Years since quitting: 7.9  . Smokeless tobacco: Never Used  . Tobacco comment: 1/2-1pack off and on all her life  Substance Use Topics  . Alcohol use: No    Alcohol/week: 0.0 standard drinks  . Drug use: No     Allergies   Codeine, Tramadol, Asa [aspirin], Azithromycin, Celebrex [celecoxib], Cymbalta [duloxetine hcl], Prednisone, Vioxx [rofecoxib], Zelnorm [tegaserod], and Zocor [simvastatin]   Review of Systems Review of Systems  Constitutional: Negative for appetite change and fatigue.  HENT: Negative for congestion, ear discharge and sinus pressure.   Eyes: Negative for discharge.  Respiratory: Negative for cough.   Cardiovascular: Negative for chest pain.  Gastrointestinal: Negative for abdominal pain and diarrhea.  Genitourinary: Negative for frequency and hematuria.  Musculoskeletal: Negative for back pain.  Skin: Negative for rash.  Neurological: Positive for weakness. Negative for seizures and headaches.  Psychiatric/Behavioral: Negative for hallucinations.     Physical Exam Updated Vital Signs BP 125/61   Pulse 85   Temp 98.3 F (36.8 C) (Oral)   Resp 18   Ht 5\' 7"  (1.702 m)   Wt 52.2 kg   SpO2 97%   BMI 18.01 kg/m   Physical Exam Vitals signs and nursing note reviewed.  Constitutional:      Appearance: She is well-developed.  HENT:     Head: Normocephalic.     Nose: Nose normal.  Eyes:     General: No scleral icterus.    Conjunctiva/sclera:  Conjunctivae normal.  Neck:     Musculoskeletal: Neck supple.     Thyroid: No thyromegaly.  Cardiovascular:     Rate and Rhythm: Normal rate and regular rhythm.     Heart sounds: No murmur. No friction rub. No gallop.   Pulmonary:     Breath sounds: No stridor. No wheezing or rales.  Chest:     Chest wall: No tenderness.  Abdominal:     General: There is no distension.     Tenderness: There  is no abdominal tenderness. There is no rebound.  Musculoskeletal: Normal range of motion.  Lymphadenopathy:     Cervical: No cervical adenopathy.  Skin:    Findings: No erythema or rash.  Neurological:     Mental Status: She is oriented to person, place, and time.     Motor: No abnormal muscle tone.     Coordination: Coordination normal.  Psychiatric:        Behavior: Behavior normal.      ED Treatments / Results  Labs (all labs ordered are listed, but only abnormal results are displayed) Labs Reviewed  COMPREHENSIVE METABOLIC PANEL - Abnormal; Notable for the following components:      Result Value   Glucose, Bld 126 (*)    BUN 25 (*)    Creatinine, Ser 1.03 (*)    Calcium 8.6 (*)    Total Bilirubin 0.2 (*)    GFR calc non Af Amer 52 (*)    All other components within normal limits  CBC - Abnormal; Notable for the following components:   RBC 2.47 (*)    Hemoglobin 6.4 (*)    HCT 22.7 (*)    MCH 25.9 (*)    MCHC 28.2 (*)    All other components within normal limits  PROTIME-INR - Abnormal; Notable for the following components:   Prothrombin Time 16.5 (*)    INR 1.4 (*)    All other components within normal limits  SARS CORONAVIRUS 2 (TAT 6-24 HRS)  POC OCCULT BLOOD, ED  TYPE AND SCREEN  PREPARE RBC (CROSSMATCH)    EKG None  Radiology No results found.  Procedures Procedures (including critical care time)  Medications Ordered in ED Medications  0.9 %  sodium chloride infusion (has no administration in time range)  sodium chloride 0.9 % bolus 1,000 mL (1,000  mLs Intravenous New Bag/Given 08/10/19 1623)     Initial Impression / Assessment and Plan / ED Course  I have reviewed the triage vital signs and the nursing notes.  Pertinent labs & imaging results that were available during my care of the patient were reviewed by me and considered in my medical decision making (see chart for details).    CRITICAL CARE Performed by: Bethann Berkshire Total critical care time:40 minutes Critical care time was exclusive of separately billable procedures and treating other patients. Critical care was necessary to treat or prevent imminent or life-threatening deterioration. Critical care was time spent personally by me on the following activities: development of treatment plan with patient and/or surrogate as well as nursing, discussions with consultants, evaluation of patient's response to treatment, examination of patient, obtaining history from patient or surrogate, ordering and performing treatments and interventions, ordering and review of laboratory studies, ordering and review of radiographic studies, pulse oximetry and re-evaluation of patient's condition.   Patient with symptomatic anemia.  She will be transfused and admitted to the hospital   Final Clinical Impressions(s) / ED Diagnoses   Final diagnoses:  Symptomatic anemia    ED Discharge Orders    None       Bethann Berkshire, MD 08/10/19 1715

## 2019-08-10 NOTE — ED Notes (Signed)
Meal tray given 

## 2019-08-10 NOTE — Telephone Encounter (Signed)
Received  Called in regards to CBC-  HGB 6.4 AND RBC 2.44.  LAB ARE IN Epic -- nurse  Jacqualin Combes and DR [facility] will  be notified,

## 2019-08-10 NOTE — ED Triage Notes (Signed)
Pt sent over by PCP for Hgb of 6.0. Pt states that this happened and few weeks ago and she had to get a unit of blood. Denies rectal bleeding or dark stools. Pt denies abdominal pain. Pt also c/o SOB which she states happens when her Hgb drops. Pt pale. Pt is on eliquis and she was instructed not to take it this morning.

## 2019-08-11 DIAGNOSIS — K900 Celiac disease: Secondary | ICD-10-CM | POA: Diagnosis present

## 2019-08-11 DIAGNOSIS — F17200 Nicotine dependence, unspecified, uncomplicated: Secondary | ICD-10-CM | POA: Diagnosis not present

## 2019-08-11 DIAGNOSIS — Z886 Allergy status to analgesic agent status: Secondary | ICD-10-CM | POA: Diagnosis not present

## 2019-08-11 DIAGNOSIS — F17210 Nicotine dependence, cigarettes, uncomplicated: Secondary | ICD-10-CM | POA: Diagnosis present

## 2019-08-11 DIAGNOSIS — M4690 Unspecified inflammatory spondylopathy, site unspecified: Secondary | ICD-10-CM | POA: Diagnosis present

## 2019-08-11 DIAGNOSIS — I5022 Chronic systolic (congestive) heart failure: Secondary | ICD-10-CM

## 2019-08-11 DIAGNOSIS — J8410 Pulmonary fibrosis, unspecified: Secondary | ICD-10-CM | POA: Diagnosis present

## 2019-08-11 DIAGNOSIS — Z881 Allergy status to other antibiotic agents status: Secondary | ICD-10-CM | POA: Diagnosis not present

## 2019-08-11 DIAGNOSIS — Z7901 Long term (current) use of anticoagulants: Secondary | ICD-10-CM | POA: Diagnosis not present

## 2019-08-11 DIAGNOSIS — K766 Portal hypertension: Secondary | ICD-10-CM | POA: Diagnosis present

## 2019-08-11 DIAGNOSIS — I480 Paroxysmal atrial fibrillation: Secondary | ICD-10-CM

## 2019-08-11 DIAGNOSIS — Z79899 Other long term (current) drug therapy: Secondary | ICD-10-CM | POA: Diagnosis not present

## 2019-08-11 DIAGNOSIS — M810 Age-related osteoporosis without current pathological fracture: Secondary | ICD-10-CM | POA: Diagnosis present

## 2019-08-11 DIAGNOSIS — D62 Acute posthemorrhagic anemia: Secondary | ICD-10-CM | POA: Diagnosis present

## 2019-08-11 DIAGNOSIS — Z885 Allergy status to narcotic agent status: Secondary | ICD-10-CM | POA: Diagnosis not present

## 2019-08-11 DIAGNOSIS — K3189 Other diseases of stomach and duodenum: Secondary | ICD-10-CM | POA: Diagnosis present

## 2019-08-11 DIAGNOSIS — K922 Gastrointestinal hemorrhage, unspecified: Secondary | ICD-10-CM | POA: Diagnosis present

## 2019-08-11 DIAGNOSIS — Z20828 Contact with and (suspected) exposure to other viral communicable diseases: Secondary | ICD-10-CM | POA: Diagnosis present

## 2019-08-11 DIAGNOSIS — F329 Major depressive disorder, single episode, unspecified: Secondary | ICD-10-CM | POA: Diagnosis present

## 2019-08-11 DIAGNOSIS — Z8719 Personal history of other diseases of the digestive system: Secondary | ICD-10-CM | POA: Diagnosis not present

## 2019-08-11 DIAGNOSIS — D509 Iron deficiency anemia, unspecified: Secondary | ICD-10-CM | POA: Diagnosis present

## 2019-08-11 DIAGNOSIS — K219 Gastro-esophageal reflux disease without esophagitis: Secondary | ICD-10-CM | POA: Diagnosis present

## 2019-08-11 DIAGNOSIS — E785 Hyperlipidemia, unspecified: Secondary | ICD-10-CM | POA: Diagnosis present

## 2019-08-11 DIAGNOSIS — D649 Anemia, unspecified: Secondary | ICD-10-CM | POA: Diagnosis present

## 2019-08-11 DIAGNOSIS — F419 Anxiety disorder, unspecified: Secondary | ICD-10-CM | POA: Diagnosis present

## 2019-08-11 DIAGNOSIS — J449 Chronic obstructive pulmonary disease, unspecified: Secondary | ICD-10-CM | POA: Diagnosis present

## 2019-08-11 LAB — CBC
HCT: 25.9 % — ABNORMAL LOW (ref 36.0–46.0)
Hemoglobin: 7.7 g/dL — ABNORMAL LOW (ref 12.0–15.0)
MCH: 26.5 pg (ref 26.0–34.0)
MCHC: 29.7 g/dL — ABNORMAL LOW (ref 30.0–36.0)
MCV: 89 fL (ref 80.0–100.0)
Platelets: 240 10*3/uL (ref 150–400)
RBC: 2.91 MIL/uL — ABNORMAL LOW (ref 3.87–5.11)
RDW: 15.3 % (ref 11.5–15.5)
WBC: 9.1 10*3/uL (ref 4.0–10.5)
nRBC: 0 % (ref 0.0–0.2)

## 2019-08-11 LAB — OCCULT BLOOD X 1 CARD TO LAB, STOOL: Fecal Occult Bld: POSITIVE — AB

## 2019-08-11 MED ORDER — BUDESONIDE 0.5 MG/2ML IN SUSP
0.5000 mg | Freq: Two times a day (BID) | RESPIRATORY_TRACT | Status: DC
  Administered 2019-08-11: 0.5 mg via RESPIRATORY_TRACT
  Filled 2019-08-11 (×2): qty 2

## 2019-08-11 MED ORDER — IPRATROPIUM-ALBUTEROL 0.5-2.5 (3) MG/3ML IN SOLN
3.0000 mL | Freq: Four times a day (QID) | RESPIRATORY_TRACT | Status: DC
  Administered 2019-08-11: 21:00:00 3 mL via RESPIRATORY_TRACT
  Filled 2019-08-11: qty 3

## 2019-08-11 MED ORDER — METOPROLOL TARTRATE 5 MG/5ML IV SOLN
5.0000 mg | Freq: Once | INTRAVENOUS | Status: DC
  Filled 2019-08-11: qty 5

## 2019-08-11 MED ORDER — SODIUM CHLORIDE 0.9 % IV BOLUS: 250.0000 mL | Freq: Once | INTRAVENOUS | Status: DC

## 2019-08-11 MED ORDER — IPRATROPIUM-ALBUTEROL 0.5-2.5 (3) MG/3ML IN SOLN
3.0000 mL | Freq: Three times a day (TID) | RESPIRATORY_TRACT | Status: DC
  Filled 2019-08-11: qty 3

## 2019-08-11 NOTE — Progress Notes (Signed)
PROGRESS NOTE  Misty [name][id number] DOB: November 21, 1940 DOA: 08/10/2019 PCP: Sonny Masters, FNP  Brief History:  78 year old female with a history of atrial fibrillation on apixaban, GERD, hyperlipidemia, systolic CHF, IBS, diverticulosis, anxiety, celiac disease presenting from her primary care provider's office secondary to anemia noted on her blood work. The patient was recently admitted from 07/28/19 to 07/30/19 for ABLA.  She was transfused with one unit PRBC.    She was also in hospital from 07/10/2019 to 07/13/2019 for acute blood loss anemia. The patient underwent EGD on 07/12/2019 which revealed grade a esophagitis with portal hypertensive gastropathy. There is no obvious bleeding lesion. She also underwent colonoscopy on the same day which showed diverticulosis in the sigmoid and descending colon with internal and external hemorrhoids. During her August hospitalization, the patient was transfused with 2 units PRBC. Since her discharge from the hospital, the patient has not had any hematochezia or melena or hematemesis. She has complained of worsening generalized weakness and fatigue. She denied any chest discomfort, dizziness, syncope. Blood work at her PCPs office showed her hemoglobin to be 6.4. As result, the patient presented for further evaluation. GI was consulted to assist with management. The patient was transfused with 1 unit PRBC.  Assessment/Plan: Acute blood loss anemia -Presented with hemoglobin 6.4 -Previous baseline hemoglobin~10 -Transfused 1 unit PRBC -Holding apixaban-->restart 08/01/19 -GI consulted   Paroxysmal atrial fibrillation -CHADSVASc = 5 -Holding apixaban -Continue bisoprolol  Chronic systolic CHF -03/26/2019 echo EF 30-35%, diffuse HK, moderate MR -Continue bisoprolol  Iron deficiency anemia -Patient was previously on iron, but not presently taking -Received 1 unit PRBC  GERD -Continue Protonix  Tobacco abuse/COPD -She  continues to smoke 1/2 pack/day -Declines nicotine patch I have discussed tobacco cessation with the patient. I have counseled the patient regarding the negative impacts of continued tobacco use including but not limited to lung cancer, COPD, and cardiovascular disease. I have discussed alternatives to tobacco and modalities that may help facilitate tobacco cessation including but not limited to biofeedback, hypnosis, and medications. Total time spent with tobacco counseling was 4 minutes. -start duonebs -start pulmicort        Disposition Plan:   Home 9/24 if ok with GI  Family Communication:   No Family at bedside  Consultants:  GI  Code Status:  FULL   DVT Prophylaxis:  SCDs   Procedures: As Listed in Progress Note Above  Antibiotics: None       Subjective: Patient denies fevers, chills, headache, chest pain, dyspnea, nausea, vomiting, diarrhea, abdominal pain, dysuria, hematuria, hematochezia, and melena.   Objective: Vitals:   08/10/19 2300 08/11/19 0054 08/11/19 0459 08/11/19 1355  BP:  (!) 150/69 (!) 141/71 126/70  Pulse: (!) 126 82 91 77  Resp:  (!) 22 17 20   Temp:  98 F (36.7 C) 98.6 F (37 C) 98.1 F (36.7 C)  TempSrc:  Oral Oral Oral  SpO2: 96% 97% 94% 94%  Weight:      Height:        Intake/Output Summary (Last 24 hours) at 08/11/2019 1717 Last data filed at 08/11/2019 1245 Gross per 24 hour  Intake 1055 ml  Output 550 ml  Net 505 ml   Weight change:  Exam:   General:  Pt is alert, follows commands appropriately, not in acute distress  HEENT: No icterus, No thrush, No neck mass, [place]/AT  Cardiovascular: RRR, S1/S2, no rubs, no gallops  Respiratory: scattered rales  bilateral;  Bibasilar wheeze  Abdomen: Soft/+BS, non tender, non distended, no guarding  Extremities: No edema, No lymphangitis, No petechiae, No rashes, no synovitis   Data Reviewed: I have personally reviewed following labs and imaging studies Basic Metabolic  Panel: Recent Labs  Lab 08/10/19 1509  NA 136  K 4.3  CL 100  CO2 26  GLUCOSE 126*  BUN 25*  CREATININE 1.03*  CALCIUM 8.6*   Liver Function Tests: Recent Labs  Lab 08/10/19 1509  AST 15  ALT 10  ALKPHOS 110  BILITOT 0.2*  PROT 6.8  ALBUMIN 3.6   No results for input(s): LIPASE, AMYLASE in the last 168 hours. No results for input(s): AMMONIA in the last 168 hours. Coagulation Profile: Recent Labs  Lab 08/10/19 1509  INR 1.4*   CBC: Recent Labs  Lab 08/09/19 1425 08/10/19 1509 08/10/19 2124 08/11/19 0442  WBC 9.0 8.8  --  9.1  HGB 6.4* 6.4* 8.5* 7.7*  HCT 20.8* 22.7* 28.9* 25.9*  MCV 85 91.9  --  89.0  PLT 254 274  --  240   Cardiac Enzymes: No results for input(s): CKTOTAL, CKMB, CKMBINDEX, TROPONINI in the last 168 hours. BNP: Invalid input(s): POCBNP CBG: No results for input(s): GLUCAP in the last 168 hours. HbA1C: No results for input(s): HGBA1C in the last 72 hours. Urine analysis:    Component Value Date/Time   COLORURINE YELLOW 01/14/2019 0226   APPEARANCEUR CLEAR 01/14/2019 0226   LABSPEC 1.012 01/14/2019 0226   PHURINE 7.0 01/14/2019 0226   GLUCOSEU NEGATIVE 01/14/2019 0226   HGBUR NEGATIVE 01/14/2019 0226   BILIRUBINUR NEGATIVE 01/14/2019 0226   KETONESUR NEGATIVE 01/14/2019 0226   PROTEINUR NEGATIVE 01/14/2019 0226   NITRITE NEGATIVE 01/14/2019 0226   LEUKOCYTESUR NEGATIVE 01/14/2019 0226   Sepsis Labs: @LABRCNTIP (procalcitonin:4,lacticidven:4) ) Recent Results (from the past 240 hour(s))  SARS CORONAVIRUS 2 (Nikitha Mode 6-24 HRS) Nasopharyngeal Nasopharyngeal Swab     Status: None   Collection Time: 08/10/19  3:35 PM   Specimen: Nasopharyngeal Swab  Result Value Ref Range Status   SARS Coronavirus 2 NEGATIVE NEGATIVE Final    Comment: (NOTE) SARS-CoV-2 target nucleic acids are NOT DETECTED. The SARS-CoV-2 RNA is generally detectable in upper and lower respiratory specimens during the acute phase of infection. Negative results do not  preclude SARS-CoV-2 infection, do not rule out co-infections with other pathogens, and should not be used as the sole basis for treatment or other patient management decisions. Negative results must be combined with clinical observations, patient history, and epidemiological information. The expected result is Negative. Fact Sheet for Patients: HairSlick.no Fact Sheet for Healthcare Providers: quierodirigir.com This test is not yet approved or cleared by the [place] FDA and  has been authorized for detection and/or diagnosis of SARS-CoV-2 by FDA under an Emergency Use Authorization (EUA). This EUA will remain  in effect (meaning this test can be used) for the duration of the COVID-19 declaration under Section 56 4(b)(1) of the Act, 21 U.S.C. section 360bbb-3(b)(1), unless the authorization is terminated or revoked sooner. Performed at [facility], [address] [place], [place] [address]      Scheduled Meds:  bisoprolol  2.5 mg Oral Daily   escitalopram  20 mg Oral QHS   furosemide  20 mg Oral Daily   pantoprazole (PROTONIX) IV  40 mg Intravenous Q24H   Continuous Infusions:  Procedures/Studies: Ct Angio Abd/pel W/ And/or W/o  Result Date: 07/30/2019 CLINICAL DATA:  78 year old female with history of GI bleed  EXAM: CTA ABDOMEN AND PELVIS wITHOUT AND WITH CONTRAST TECHNIQUE: Multidetector CT imaging of the abdomen and pelvis was performed using the standard protocol during bolus administration of intravenous contrast. Multiplanar reconstructed images and MIPs were obtained and reviewed to evaluate the vascular anatomy. CONTRAST:  OMNIPAQUE IOHEXOL 350 MG/ML SOLN COMPARISON:  July 19, 2016 FINDINGS: VASCULAR Aorta: Atherosclerotic changes of the lower thoracic and the abdominal aorta. Greatest diameter of the thoracic aorta at the hiatus measures 3.2 cm. Greatest diameter of the abdominal aorta is  suprarenal, measuring 3.1 cm at the level of the SMA origin. No dissection flap.  No periaortic fluid. Celiac: Atherosclerotic changes at the celiac artery origin with less than 50% stenosis. Branches remain patent. SMA: Dense atherosclerotic calcifications at the SMA origin with likely high-grade stenosis secondary to calcified plaque. Renals: On the right there are 2 renal arteries originating at the 10 o'clock position cranially and the 11 o'clock position caudally. Both of these demonstrate significant atherosclerotic changes with estimated 50% narrowing. On the left, the main renal artery originates at the 3 o'clock position with atherosclerotic changes at the origin. There is a small accessory renal artery to the lower pole originating at the 2 o'clock position which remains patent. IMA: IMA is patent. Right lower extremity: Moderate atherosclerotic changes of the right iliac system. Diameter of the common iliac arteries 13 mm. Calcifications of the hypogastric artery which remains patent. External iliac artery is patent. Common femoral artery patent. Proximal profunda femoris and SFA patent. Left lower extremity: Moderate atherosclerotic changes of the left iliac system. Diameter of the common iliac artery measures 11 mm. Hypogastric artery remains patent. External iliac artery is patent. Common femoral artery patent. Proximal profunda femoris and SFA are patent. Veins: Unremarkable appearance of the venous system. Review of the MIP images confirms the above findings. NON-VASCULAR Lower chest: No acute. Hepatobiliary: Redemonstration of small low-density cysts of left and right liver, similar distribution and size to the comparison CT of 2017. Gallbladder is decompressed, with no calcified cholelithiasis. Pancreas: Unremarkable Spleen: Unremarkable Adrenals/Urinary Tract: Unremarkable appearance of adrenal glands. Right: Perfusion symmetric to the left kidney. No hydronephrosis or nephrolithiasis.  Unremarkable course of the right ureter. Left: Symmetric perfusion to the right. No hydronephrosis. No nephrolithiasis. Unremarkable course of the left ureter. Unremarkable appearance of the urinary bladder . Stomach/Bowel: No accumulation of contrast that with identify or confirm a source of gastrointestinal hemorrhage. The endoscopy capsule is present within the stomach lumen. Otherwise unremarkable stomach. Unremarkable appearance of small bowel. No evidence of obstruction. Normal appendix. large fecal stool burden throughout the colon with no focal wall thickening or significant inflammatory changes. Diverticular change present. Lymphatic: No lymphadenopathy. Mesenteric: There is trace fluid within the anatomic pelvis without focal inflammatory changes within the abdomen that may suggest a etiology. Reproductive: Hysterectomy Other: No hernia. Musculoskeletal: Surgical changes of left hip repair with incomplete remodeling/healing at the fracture site. Osteopenia. Multiple levels of vertebral augmentation, similar to prior. No new fracture identified. IMPRESSION: No acute abnormality, with no evidence of acute gastrointestinal hemorrhage. Large fecal stool burden within the colon. Aortic atherosclerosis with associated mesenteric and bilateral renal arterial disease. All 3 mesenteric arteries remain patent, with high-grade stenosis at the origin of the SMA. Bilateral moderate iliac arterial disease with no high-grade stenosis or occlusion. Additional ancillary findings as above. Signed, Yvone Neu. Reyne Dumas, RPVI [facility] Radiology Electronically Signed   By: Gilmer Mor D.O.   On: 07/30/2019 11:21  Catarina Hartshorn, DO  [facility] Pager [phone]  If 7PM-7AM, please contact night-coverage [email or web address] Password [email or web address] 08/11/2019, 5:17 PM   LOS: 0 days

## 2019-08-11 NOTE — Progress Notes (Signed)
Patients vital signs obtained. Patient resting in bed. Heart rate 82 on the monitor. MD notified

## 2019-08-11 NOTE — Telephone Encounter (Signed)
Pt is presently in the hospital being treated.  Eliquis was removed from her medication list as per Dr Hochrein's order to stop it.

## 2019-08-11 NOTE — Progress Notes (Signed)
Patient heart rate sustaining in the 120's on telemetry. Midlevel notified. 12 Lead EKG ordered.

## 2019-08-11 NOTE — Consult Note (Signed)
Referring Provider: Orson Eva, DO Primary Care Physician:  Baruch Gouty, FNP Primary Gastroenterologist:  Dr. Laural Golden  Reason for Consultation:    Anemia and GI bleed.  HPI:   Patient is 78 year old Caucasian female with multiple medical problems history of celiac disease chronic GERD as well as paroxysmal atrial fibrillation on anticoagulation had routine blood work by her PCP noted to have hemoglobin of 6 g.  Patient was therefore advised to report to emergency room.  Hemoglobin on arrival was 6.4 g.  Patient was admitted to hospitalist service and received 1 unit of PRBCs.  Globin from this morning was 7.7 g. Patient reports passing dark stools but she denies melena or rectal bleeding.  Hemoccult was done this afternoon and it is positive. Patient was advised to stop Eliquis and last dose was 2 days ago. Patient is hungry.  She has irregular bowel movements.  She states she passes stool every time she urinates.  She generally passes small amount of formed stool.  She says her appetite is fair.  She has not experienced nausea or vomiting.  She feels heartburn is well controlled with therapy. Patient states she has been feeling tired and weak but she has not had postural symptoms chest pain or dyspnea. Patient was hospitalized about 4 weeks ago with anemia and heme positive stool.  Studies were consistent with iron deficiency anemia.  EGD on 8/24/202020 revealed small sliding hiatal hernia portal gastropathy and minimal scalloping to duodenal mucosa but biopsy revealed resolution of changes of celiac disease.  Colonoscopy on the same day revealed left-sided diverticulosis internal and external hemorrhoids but no polyps angiodysplasia or other abnormalities. Plans were made for patient to return for small bowel given capsule study she presented again with drop in her H&H requiring transfusion.  She underwent small bowel given capsule study on 07/29/2019 and capsule there were left the stomach.   Therefore the study was incomplete. During her last admission she also had CT abdomen and pelvis and no lesion was identified. She tells me that she will not be going back on Eliquis until source of GI bleed identified and treated.   Past Medical History:  Diagnosis Date  . Anxiety    takes Xanax daily  . Arthritis    back  . Atrial fibrillation with RVR ([place]) 03/25/2019  . Cataract   . Chronic back pain   . Constipation    OTC stool softener prn  . COPD (chronic obstructive pulmonary disease) ([place])   . Depression   . Diverticulosis   . GERD (gastroesophageal reflux disease)      . Hemorrhoids   . History of bronchitis   . History of migraine    many yrs ago  . History of shingles   . Hyperlipidemia   . Insomnia   . Migraines   . Osteoporosis   . PONV (postoperative nausea and vomiting)     Past Surgical History:  Procedure Laterality Date  . ABDOMINAL HYSTERECTOMY    . BIOPSY  07/12/2019   Procedure: BIOPSY;  Surgeon: Rogene Houston, MD;  [facility];  Service: Endoscopy;;  duodenum  . COLONOSCOPY    . COLONOSCOPY N/A 02/17/2015   Procedure: COLONOSCOPY;  Surgeon: Rogene Houston, MD;  [facility];  Service: Endoscopy;  Laterality: N/A;  110n - moved to 11:15 - Ann to notify pt  . COLONOSCOPY N/A 07/12/2019   Procedure: COLONOSCOPY;  Surgeon: Rogene Houston, MD;  [facility];  Service:  Endoscopy;  Laterality: N/A;  . ESOPHAGOGASTRODUODENOSCOPY    . ESOPHAGOGASTRODUODENOSCOPY N/A 01/29/2016   Procedure: ESOPHAGOGASTRODUODENOSCOPY (EGD);  Surgeon: Rogene Houston, MD;  [facility];  Service: Endoscopy;  Laterality: N/A;  . ESOPHAGOGASTRODUODENOSCOPY N/A 10/14/2017   Procedure: ESOPHAGOGASTRODUODENOSCOPY (EGD);  Surgeon: Rogene Houston, MD;  [facility];  Service: Endoscopy;  Laterality: N/A;  730  . ESOPHAGOGASTRODUODENOSCOPY N/A 07/12/2019   Procedure: ESOPHAGOGASTRODUODENOSCOPY (EGD);  Surgeon: Rogene Houston, MD;  [facility];  Service: Endoscopy;  Laterality: N/A;  . EYE SURGERY Right 3/15   cataracts  . GIVENS CAPSULE STUDY N/A 07/29/2019   Procedure: GIVENS CAPSULE STUDY;  Surgeon: Rogene Houston, MD;  [facility];  Service: Endoscopy;  Laterality: N/A;  . INTRAMEDULLARY (IM) NAIL INTERTROCHANTERIC Left 01/07/2019   Procedure: INTRAMEDULLARY (IM) NAIL INTERTROCHANTRIC;  Surgeon: Carole Civil, MD;  Location: [facility];  Service: Orthopedics;  Laterality: Left;  . KYPHOPLASTY N/A 07/08/2014   Procedure: Thoracic Eleven Kyphoplasty;  Surgeon: Hosie Spangle, MD;  Location: [facility];  Service: Neurosurgery;  Laterality: N/A;  . left hip surgery    . LUMBAR LAMINECTOMY/DECOMPRESSION MICRODISCECTOMY Bilateral 05/20/2013   Procedure: LUMBAR LAMINECTOMY/DECOMPRESSION MICRODISCECTOMY 1 LEVEL;  Surgeon: Hosie Spangle, MD;  Location: [facility];  Service: Neurosurgery;  Laterality: Bilateral;  Bilateral Lumbar four-five laminotomy and left lumbar four-five microdiskectomy  . RIGHT/LEFT HEART CATH AND CORONARY ANGIOGRAPHY N/A 03/31/2019   Procedure: RIGHT/LEFT HEART CATH AND CORONARY ANGIOGRAPHY;  Surgeon: Sherren Mocha, MD;  Location: [facility];  Service: Cardiovascular;  Laterality: N/A;  . SPINE SURGERY     Dr Carloyn Manner -  vertebraplasty    Prior to Admission medications   Medication Sig Start Date End Date Taking? Authorizing Provider  acetaminophen (TYLENOL) 500 MG tablet Take 500 mg by mouth every 8 (eight) hours as needed for mild pain, moderate pain or headache.  01/20/19  Yes [provider]  albuterol (PROVENTIL HFA;VENTOLIN HFA) 108 (90 Base) MCG/ACT inhaler Inhale 2 puffs into the lungs every 6 (six) hours as needed for wheezing or shortness of breath (cough). 01/28/19  Yes Gerlene Fee, NP  ALPRAZolam Duanne Moron) 0.5 MG tablet Take 1 tablet (0.5 mg total) by mouth 2 (two) times daily as needed for anxiety. Patient taking differently: Take 0.5  mg by mouth 2 (two) times daily.  07/24/19 08/23/19 Yes Rakes, Connye Burkitt, FNP  bisoprolol (ZEBETA) 5 MG tablet Take 0.5 tablets (2.5 mg total) by mouth daily. 08/04/19  Yes Minus Breeding, MD  cholecalciferol (VITAMIN D) 1000 UNITS tablet Take 2,000 Units by mouth daily.    Yes [provider]  escitalopram (LEXAPRO) 20 MG tablet TAKE ONE TABLET AT BEDTIME Patient taking differently: Take 20 mg by mouth at bedtime.  06/24/19  Yes Jaylynne Birkhead, Mechele Dawley, MD  furosemide (LASIX) 20 MG tablet Take 1 tablet (20 mg total) by mouth daily. 04/07/19  Yes Minus Breeding, MD  Multiple Vitamin (MULTIVITAMIN WITH MINERALS) TABS tablet Take 1 tablet by mouth daily. Centrum Silver   Yes [provider]  pantoprazole (PROTONIX) 40 MG tablet Take 1 tablet (40 mg total) by mouth 2 (two) timesdaily before a meal. Patient taking differently: Take 40 mg by mouth 2 (two) times daily.  05/24/19  Yes Jasdeep Kepner, Mechele Dawley, MD  potassium chloride SA (K-DUR) 20 MEQ tablet Take 1 tablet (20 mEq total) by mouth daily for 30 days. 04/19/19 08/10/19 Yes Rakes, Connye Burkitt, FNP  vitamin B-12 (  CYANOCOBALAMIN) 1000 MCG tablet Take 1,000 mcg by mouth daily.   Yes [provider]    Current Facility-Administered Medications  Medication Dose Route Frequency Provider Last Rate Last Dose  . acetaminophen (TYLENOL) tablet 650 mg  650 mg Oral Q6H PRN Emokpae, Ejiroghene E, MD   650 mg at 08/11/19 8938   Or  . acetaminophen (TYLENOL) suppository 650 mg  650 mg Rectal Q6H PRN Emokpae, Ejiroghene E, MD      . albuterol (PROVENTIL) (2.5 MG/3ML) 0.083% nebulizer solution 3 mL  3 mL Inhalation Q6H PRN Emokpae, Ejiroghene E, MD      . ALPRAZolam Duanne Moron) tablet 0.5 mg  0.5 mg Oral BID PRN Schorr, Rhetta Mura, NP   0.5 mg at 08/11/19 1259  . bisoprolol (ZEBETA) tablet 2.5 mg  2.5 mg Oral Daily Emokpae, Ejiroghene E, MD   2.5 mg at 08/11/19 0831  . budesonide (PULMICORT) nebulizer solution 0.5 mg  0.5 mg Nebulization BID Tat, David, MD      .  escitalopram (LEXAPRO) tablet 20 mg  20 mg Oral QHS Emokpae, Ejiroghene E, MD   20 mg at 08/10/19 2224  . furosemide (LASIX) tablet 20 mg  20 mg Oral Daily Emokpae, Ejiroghene E, MD   20 mg at 08/11/19 0831  . ipratropium-albuterol (DUONEB) 0.5-2.5 (3) MG/3ML nebulizer solution 3 mL  3 mL Nebulization Q6H Tat, David, MD      . ondansetron (ZOFRAN) tablet 4 mg  4 mg Oral Q6H PRN Emokpae, Ejiroghene E, MD       Or  . ondansetron (ZOFRAN) injection 4 mg  4 mg Intravenous Q6H PRN Emokpae, Ejiroghene E, MD      . pantoprazole (PROTONIX) injection 40 mg  40 mg Intravenous Q24H Emokpae, Ejiroghene E, MD   40 mg at 08/10/19 2224  . polyethylene glycol (MIRALAX / GLYCOLAX) packet 17 g  17 g Oral Daily PRN Emokpae, Ejiroghene E, MD        Allergies as of 08/10/2019 - Review Complete 08/10/2019  Allergen Reaction Noted  . Codeine Nausea And Vomiting 04/08/2013  . Tramadol Nausea And Vomiting 10/07/2017  . Asa [aspirin] Other (See Comments) 04/08/2013  . Azithromycin Other (See Comments) 04/08/2013  . Celebrex [celecoxib] Other (See Comments) 04/08/2013  . Cymbalta [duloxetine hcl] Other (See Comments) 04/08/2013  . Prednisone Rash 03/25/2013  . Vioxx [rofecoxib] Other (See Comments) 04/08/2013  . Zelnorm [tegaserod] Other (See Comments) 04/08/2013  . Zocor [simvastatin] Other (See Comments) 04/08/2013    Family History  Problem Relation Age of Onset  . Hypertension Mother   . Heart attack Mother   . Macular degeneration Father   . Heart disease Father   . Cirrhosis Son   . Heart disease Son   . Colon cancer Neg Hx     Social History   Socioeconomic History  . Marital status: Married    Spouse name: Sterling Big   . Number of children: Not on file  . Years of education: Not on file  . Highest education level: Not on file  Occupational History  . Occupation: retired     Comment: [place] work - came out in [employer]  . Financial resource strain: Not hard at all  . Food insecurity     Worry: Never true    Inability: Never true  . Transportation needs    Medical: No    Non-medical: No  Tobacco Use  . Smoking status: Current Some Day Smoker    Packs/day: 1.00  Years: 52.00    Pack years: 52.00    Types: Cigarettes    Start date: 11/18/1958    Last attempt to quit: 08/20/2011    Years since quitting: 7.9  . Smokeless tobacco: Never Used  . Tobacco comment: 1/2-1pack off and on all her life  Substance and Sexual Activity  . Alcohol use: No    Alcohol/week: 0.0 standard drinks  . Drug use: No  . Sexual activity: Not Currently  Lifestyle  . Physical activity    Days per week: Patient refused    Minutes per session: Patient refused  . Stress: Only a little  Relationships  . Social connections    Talks on phone: More than three times a week    Gets together: Once a week    Attends religious service: Patient refused    Active member of club or organization: Patient refused    Attends meetings of clubs or organizations: Patient refused    Relationship status: Married  . Intimate partner violence    Fear of current or ex partner: No    Emotionally abused: No    Physically abused: No    Forced sexual activity: No  Other Topics Concern  . Not on file  Social History Narrative   Lives at home with husband, Sterling Big. Had one son- now deceased     Review of Systems: See HPI, otherwise normal ROS  Physical Exam: Temp:  [98 F (36.7 C)-99.3 F (37.4 C)] 98.1 F (36.7 C) (09/23 1355) Pulse Rate:  [77-126] 77 (09/23 1355) Resp:  [16-28] 20 (09/23 1355) BP: (126-150)/(58-112) 126/70 (09/23 1355) SpO2:  [94 %-97 %] 94 % (09/23 1355) Weight:  [53.7 kg] 53.7 kg (09/22 2039) Last BM Date: 08/11/19  Patient is alert and in no acute distress. She appears very pale. Conjunctiva is also pale and sclera is nonicteric. Neck without masses thyromegaly or lymphadenopathy. Cardiac exam with regular rhythm normal S1 and S2.  No murmur or gallop noted. Auscultation  lungs reveal vesicular breath sounds bilaterally. Abdomen is symmetrical.  Bowel sounds are normal.  On palpation abdomen is soft.  She has mild hypogastric tenderness.  No organomegaly or masses noted. Extremities are thin.  She does not have clubbing koilonychia or peripheral edema.    Lab Results: Recent Labs    08/09/19 1425 08/10/19 1509 08/10/19 2124 08/11/19 0442  WBC 9.0 8.8  --  9.1  HGB 6.4* 6.4* 8.5* 7.7*  HCT 20.8* 22.7* 28.9* 25.9*  PLT 254 274  --  240   BMET Recent Labs    08/10/19 1509  NA 136  K 4.3  CL 100  CO2 26  GLUCOSE 126*  BUN 25*  CREATININE 1.03*  CALCIUM 8.6*   LFT Recent Labs    08/10/19 1509  PROT 6.8  ALBUMIN 3.6  AST 15  ALT 10  ALKPHOS 110  BILITOT 0.2*   PT/INR Recent Labs    08/10/19 1509  LABPROT 16.5*  INR 1.4*     Assessment;  Patient is 78 year old Caucasian female with history of A. fib on anticoagulant as well as history of celiac disease on gluten-free diet who presents again with drop in her H&H and stool is guaiac positive.  She has received 1 unit of PRBC. Patient has chronic or intermittent GI bleed possibly from small bowel source.  Prior studies include EGD colonoscopy CT abdomen and pelvis.  Small bowel given capsule study was incomplete as capsule never left the stomach. Patient possibly is bleeding  from small bowel source.  Recent duodenal biopsy revealed celiac disease to be in remission implying dietary compliance.  Therefore doubt that she has active disease distally.  CTA of 07/30/2019 did not reveal adenopathy.  Therefore small bowel lymphoma unlikely. Since her stool is heme positive would be reasonable to proceed with GI bleeding scan.  If positive it would allow [place] to focus on that particular organ.  Recommendations;  GI bleeding scan in a.m. Patient to have repeat CBC in a.m.  Discussed with Dr. Shanon Brow Tat.  LOS: 0 days   Kateria Cutrona  08/11/2019, 6:29 PM

## 2019-08-12 ENCOUNTER — Encounter (HOSPITAL_COMMUNITY): Payer: Self-pay

## 2019-08-12 ENCOUNTER — Ambulatory Visit (HOSPITAL_COMMUNITY): Admit: 2019-08-12 | Payer: Medicare Other | Admitting: Internal Medicine

## 2019-08-12 ENCOUNTER — Inpatient Hospital Stay (HOSPITAL_COMMUNITY): Payer: Medicare Other

## 2019-08-12 LAB — CBC
HCT: 27.1 % — ABNORMAL LOW (ref 36.0–46.0)
Hemoglobin: 8 g/dL — ABNORMAL LOW (ref 12.0–15.0)
MCH: 26.1 pg (ref 26.0–34.0)
MCHC: 29.5 g/dL — ABNORMAL LOW (ref 30.0–36.0)
MCV: 88.6 fL (ref 80.0–100.0)
Platelets: 243 10*3/uL (ref 150–400)
RBC: 3.06 MIL/uL — ABNORMAL LOW (ref 3.87–5.11)
RDW: 15.4 % (ref 11.5–15.5)
WBC: 7.9 10*3/uL (ref 4.0–10.5)
nRBC: 0 % (ref 0.0–0.2)

## 2019-08-12 SURGERY — IMAGING PROCEDURE, GI TRACT, INTRALUMINAL, VIA CAPSULE

## 2019-08-12 MED ORDER — HEPARIN SOD (PORK) LOCK FLUSH 100 UNIT/ML IV SOLN
INTRAVENOUS | Status: AC
  Filled 2019-08-12: qty 5

## 2019-08-12 MED ORDER — APIXABAN 5 MG PO TABS: 5.0000 mg | ORAL_TABLET | Freq: Two times a day (BID) | ORAL | Status: DC

## 2019-08-12 MED ORDER — TECHNETIUM TC 99M-LABELED RED BLOOD CELLS IV KIT
25.0000 | PACK | Freq: Once | INTRAVENOUS | Status: AC | PRN
  Administered 2019-08-12: 08:00:00 25.1 via INTRAVENOUS

## 2019-08-12 NOTE — Progress Notes (Addendum)
IVs removed and discharge instructions reviewed.  No new meds.  Husband to dirve home

## 2019-08-12 NOTE — Progress Notes (Signed)
  Subjective:  Patient has no complaints.  Denies melena rectal bleeding or abdominal pain.  She is hoping to be able to go home today.  Objective: Blood pressure 130/80, pulse (!) 120, temperature 98 F (36.7 C), temperature source Oral, resp. rate 16, height 5' 7"  (1.702 m), weight 53.7 kg, SpO2 95 %. Patient is alert and in no acute distress.  Labs/studies Results:  CBC Latest Ref Rng & Units 08/12/2019 08/11/2019 08/10/2019  WBC 4.0 - 10.5 K/uL 7.9 9.1 -  Hemoglobin 12.0 - 15.0 g/dL 8.0(L) 7.7(L) 8.5(L)  Hematocrit 36.0 - 46.0 % 27.1(L) 25.9(L) 28.9(L)  Platelets 150 - 400 K/uL 243 240 -    CMP Latest Ref Rng & Units 08/10/2019 07/29/2019 07/28/2019  Glucose 70 - 99 mg/dL 126(H) 101(H) 128(H)  BUN 8 - 23 mg/dL 25(H) 16 19  Creatinine 0.44 - 1.00 mg/dL 1.03(H) 0.93 1.00  Sodium 135 - 145 mmol/L 136 140 137  Potassium 3.5 - 5.1 mmol/L 4.3 3.8 3.9  Chloride 98 - 111 mmol/L 100 104 101  CO2 22 - 32 mmol/L 26 28 29   Calcium 8.9 - 10.3 mg/dL 8.6(L) 8.1(L) 8.5(L)  Total Protein 6.5 - 8.1 g/dL 6.8 - -  Total Bilirubin 0.3 - 1.2 mg/dL 0.2(L) - -  Alkaline Phos 38 - 126 U/L 110 - -  AST 15 - 41 U/L 15 - -  ALT 0 - 44 U/L 10 - -    Hepatic Function Latest Ref Rng & Units 08/10/2019 07/20/2019 07/11/2019  Total Protein 6.5 - 8.1 g/dL 6.8 6.2 5.5(L)  Albumin 3.5 - 5.0 g/dL 3.6 3.8 2.9(L)  AST 15 - 41 U/L 15 15 13(L)  ALT 0 - 44 U/L 10 6 11   Alk Phosphatase 38 - 126 U/L 110 117 99  Total Bilirubin 0.3 - 1.2 mg/dL 0.2(L) <0.2 0.9  Bilirubin, Direct 0.0 - 0.2 mg/dL - - -      Assessment:  #1.  Chronic/intermittent GI bleed possibly from small bowel source not yet identified.  Recent work-up includes EGD colonoscopy CTA abdomen and pelvis and now she had negative GI bleeding scan.  Small bowel given capsule study was attempted on her last admission but unfortunately capsule stayed in the stomach for study duration.  Therefore given capsule study should be repeated on an outpatient basis when  patient is ambulatory.  #2.  Anemia secondary to GI bleed.  Patient has received 1 unit of PRBC.  Hemoglobin is low but stable.  #3.  History of celiac disease.  Recent duodenal biopsy revealed her to be in remission implying dietary compliance.  #4.  History of paroxysmal atrial fibrillation.   Recommendations  Will arrange for small bowel given capsule study to be done on an outpatient basis next week possibly on Friday.  My office will contact patient. Will check H&H at that time. If patient has to go back on anticoagulant it can be resumed in 2 to 3 days. Patient should not take p.o. iron until given capsule study completed.

## 2019-08-12 NOTE — Discharge Summary (Signed)
Physician Discharge Summary  Misty [name] ZOX:[id number] DOB: Aug 21, 1941 DOA: 08/10/2019  PCP: Sonny Masters, FNP  Admit date: 08/10/2019 Discharge date: 08/12/2019  Admitted From: Home Disposition:  Home   Recommendations for Outpatient Follow-up:  1. Follow up with PCP in 1-2 weeks 2. Please obtain BMP/CBC in one week     Discharge Condition: Stable CODE STATUS: FULL Diet recommendation: Heart Healthy   Brief/Interim Summary: 78 year old female with a history of atrial fibrillation on apixaban, GERD, hyperlipidemia, systolic CHF, IBS, diverticulosis, anxiety, celiac disease presenting from her primary care provider's office secondary to anemia noted on her blood work. The patient was recently admitted from 07/28/19 to 07/30/19 for ABLA.  She was transfused with one unit PRBC.   [facility] Capsule endoscopy was attempted during her early September admit, but was unsuccessful due to capsule remained in stomach for the duration of study.  There was limited view of gastric mucosa because of presence of food debris in the stomach. Therefore small bowel mucosa could not be evaluated.   She was also in hospital from 07/10/2019 to 07/13/2019 for acute blood loss anemia. The patient underwent EGD on 07/12/2019 which revealed grade a esophagitis with portal hypertensive gastropathy. There is no obvious bleeding lesion. She also underwent colonoscopy on the same day which showed diverticulosis in the sigmoid and descending colon with internal and external hemorrhoids. During her August hospitalization, the patient was transfused with 2 units PRBC. Since her discharge from the hospital, the patient has not had any hematochezia or melena or hematemesis.  She has complained of worsening generalized weakness and fatigue again for the past 2-3 days. She denied any chest discomfort, dizziness, syncope. Blood work at her PCPs office showed her hemoglobin to be 6.4. As result, the patient presented for  further evaluation. GI was consulted to assist with management. The patient was transfused with 1 unit PRBC.  Discharge Diagnoses:   Acute blood loss anemia -Presented with hemoglobin 6.4 -Previous baseline hemoglobin~10 -Transfused 1 unit PRBC -Holding apixaban-->restart 08/16/19 -GI consulted-->bleeding scan -08/12/19 bleeding scan neg -Hgb remained stable after transfusion--8.0 at time of d/c -ok to d/c home per GI -GI plans for repeat capsule endoscopy on 08/20/19  Paroxysmal atrial fibrillation -CHADSVASc = 5 -Holding apixaban -Continue bisoprolol  Chronic systolic CHF -03/26/2019 echo EF 30-35%, diffuse HK, moderate MR -Continue bisoprolol -euvolemic clinically  Iron deficiency anemia -Patient was previously on iron, but not presently taking -Received 1 unit PRBC this admission  GERD -Continue Protonix  Tobacco abuse/COPD -She continues to smoke 1/2 pack/day -Declines nicotine patch I have discussed tobacco cessation with the patient. I have counseled the patient regarding the negative impacts of continued tobacco use including but not limited to lung cancer, COPD, and cardiovascular disease. I have discussed alternatives to tobacco and modalities that may help facilitate tobacco cessation including but not limited to biofeedback, hypnosis, and medications. Total time spent with tobacco counseling was 4 minutes. -start duonebs -start pulmicort  Discharge Instructions   Allergies as of 08/12/2019      Reactions   Codeine Nausea And Vomiting   Tramadol Nausea And Vomiting   Asa [aspirin] Other (See Comments)   Patient is unaware of allergy   Azithromycin Other (See Comments)   Patient is unaware of allergy   Celebrex [celecoxib] Other (See Comments)   Irritated stomach   Cymbalta [duloxetine Hcl] Other (See Comments)   Felt groggy   Prednisone Rash   She has seen the podiatrist and he had injected cortisone in  her feet and she had also taken a peel at  home. She had a severe reaction to her face with a rash and had to take Benadryl to resolve the symptom. She actually did get more cortisone shots, but no additional reactions to the shots.   Vioxx [rofecoxib] Other (See Comments)   Unknown   Zelnorm [tegaserod] Other (See Comments)   Patient is unaware of allergy   Zocor [simvastatin] Other (See Comments)   Patient is unaware of allergy      Medication List    TAKE these medications   acetaminophen 500 MG tablet Commonly known as: TYLENOL Take 500 mg by mouth every 8 (eight) hours as needed for mild pain, moderate pain or headache.   albuterol 108 (90 Base) MCG/ACT inhaler Commonly known as: VENTOLIN HFA Inhale 2 puffs into the lungs every 6 (six) hours as needed for wheezing or shortness of breath (cough).   ALPRAZolam 0.5 MG tablet Commonly known as: XANAX Take 1 tablet (0.5 mg total) by mouth 2 (two) times daily as needed for anxiety. What changed: when to take this   apixaban 5 MG Tabs tablet Commonly known as: ELIQUIS Take 1 tablet (5 mg total) by mouth 2 (two) times daily. Restart on 08/16/19 Start taking on: August 16, 2019   bisoprolol 5 MG tablet Commonly known as: ZEBETA Take 0.5 tablets (2.5 mg total) by mouth daily.   cholecalciferol 1000 units tablet Commonly known as: VITAMIN D Take 2,000 Units by mouth daily.   escitalopram 20 MG tablet Commonly known as: LEXAPRO TAKE ONE TABLET AT BEDTIME   furosemide 20 MG tablet Commonly known as: LASIX Take 1 tablet (20 mg total) by mouth daily.   multivitamin with minerals Tabs tablet Take 1 tablet by mouth daily. Centrum Silver   pantoprazole 40 MG tablet Commonly known as: PROTONIX Take 1 tablet (40 mg total) by mouth 2 (two) timesdaily before a meal. What changed: See the new instructions.   potassium chloride SA 20 MEQ tablet Commonly known as: K-DUR Take 1 tablet (20 mEq total) by mouth daily for 30 days.   vitamin B-12 1000 MCG tablet Commonly  known as: CYANOCOBALAMIN Take 1,000 mcg by mouth daily.       Allergies  Allergen Reactions   Codeine Nausea And Vomiting   Tramadol Nausea And Vomiting   Asa [Aspirin] Other (See Comments)    Patient is unaware of allergy   Azithromycin Other (See Comments)    Patient is unaware of allergy   Celebrex [Celecoxib] Other (See Comments)    Irritated stomach    Cymbalta [Duloxetine Hcl] Other (See Comments)    Felt groggy   Prednisone Rash    She has seen the podiatrist and he had injected cortisone in her feet and she had also taken a peel at home. She had a severe reaction to her face with a rash and had to take Benadryl to resolve the symptom. She actually did get more cortisone shots, but no additional reactions to the shots.   Vioxx [Rofecoxib] Other (See Comments)    Unknown   Zelnorm [Tegaserod] Other (See Comments)    Patient is unaware of allergy   Zocor [Simvastatin] Other (See Comments)    Patient is unaware of allergy    Consultations:  GI--Rehman   Procedures/Studies: Nm Gi Blood Loss  Result Date: 08/12/2019 CLINICAL DATA:  Chronic anemia question GI blood loss EXAM: NUCLEAR MEDICINE GASTROINTESTINAL BLEEDING SCAN TECHNIQUE: Sequential abdominal images were obtained following intravenous administration  of Tc-95m labeled red blood cells. RADIOPHARMACEUTICALS:  125.1 mCi Tc-26m pertechnetate in-vitro labeled red cells. COMPARISON:  None FINDINGS: Intermittent artifacts from the patient's RIGHT arm. Normal blood pool distribution of labeled red cells. No abnormal gastrointestinal localization of tracer identified through 2 hours of imaging to suggest active GI bleeding. Small amount of de labeled tracer within urinary bladder. IMPRESSION: Negative GI bleeding scan. Electronically Signed   By: Ulyses Southward M.D.   On: 08/12/2019 10:49   Ct Angio Abd/pel W/ And/or W/o  Result Date: 07/30/2019 CLINICAL DATA:  78 year old female with history of GI bleed EXAM: CTA  ABDOMEN AND PELVIS wITHOUT AND WITH CONTRAST TECHNIQUE: Multidetector CT imaging of the abdomen and pelvis was performed using the standard protocol during bolus administration of intravenous contrast. Multiplanar reconstructed images and MIPs were obtained and reviewed to evaluate the vascular anatomy. CONTRAST:  OMNIPAQUE IOHEXOL 350 MG/ML SOLN COMPARISON:  July 19, 2016 FINDINGS: VASCULAR Aorta: Atherosclerotic changes of the lower thoracic and the abdominal aorta. Greatest diameter of the thoracic aorta at the hiatus measures 3.2 cm. Greatest diameter of the abdominal aorta is suprarenal, measuring 3.1 cm at the level of the SMA origin. No dissection flap.  No periaortic fluid. Celiac: Atherosclerotic changes at the celiac artery origin with less than 50% stenosis. Branches remain patent. SMA: Dense atherosclerotic calcifications at the SMA origin with likely high-grade stenosis secondary to calcified plaque. Renals: On the right there are 2 renal arteries originating at the 10 o'clock position cranially and the 11 o'clock position caudally. Both of these demonstrate significant atherosclerotic changes with estimated 50% narrowing. On the left, the main renal artery originates at the 3 o'clock position with atherosclerotic changes at the origin. There is a small accessory renal artery to the lower pole originating at the 2 o'clock position which remains patent. IMA: IMA is patent. Right lower extremity: Moderate atherosclerotic changes of the right iliac system. Diameter of the common iliac arteries 13 mm. Calcifications of the hypogastric artery which remains patent. External iliac artery is patent. Common femoral artery patent. Proximal profunda femoris and SFA patent. Left lower extremity: Moderate atherosclerotic changes of the left iliac system. Diameter of the common iliac artery measures 11 mm. Hypogastric artery remains patent. External iliac artery is patent. Common femoral artery patent.  Proximal profunda femoris and SFA are patent. Veins: Unremarkable appearance of the venous system. Review of the MIP images confirms the above findings. NON-VASCULAR Lower chest: No acute. Hepatobiliary: Redemonstration of small low-density cysts of left and right liver, similar distribution and size to the comparison CT of 2017. Gallbladder is decompressed, with no calcified cholelithiasis. Pancreas: Unremarkable Spleen: Unremarkable Adrenals/Urinary Tract: Unremarkable appearance of adrenal glands. Right: Perfusion symmetric to the left kidney. No hydronephrosis or nephrolithiasis. Unremarkable course of the right ureter. Left: Symmetric perfusion to the right. No hydronephrosis. No nephrolithiasis. Unremarkable course of the left ureter. Unremarkable appearance of the urinary bladder . Stomach/Bowel: No accumulation of contrast that with identify or confirm a source of gastrointestinal hemorrhage. The endoscopy capsule is present within the stomach lumen. Otherwise unremarkable stomach. Unremarkable appearance of small bowel. No evidence of obstruction. Normal appendix. large fecal stool burden throughout the colon with no focal wall thickening or significant inflammatory changes. Diverticular change present. Lymphatic: No lymphadenopathy. Mesenteric: There is trace fluid within the anatomic pelvis without focal inflammatory changes within the abdomen that may suggest a etiology. Reproductive: Hysterectomy Other: No hernia. Musculoskeletal: Surgical changes of left hip repair with incomplete remodeling/healing at the fracture  site. Osteopenia. Multiple levels of vertebral augmentation, similar to prior. No new fracture identified. IMPRESSION: No acute abnormality, with no evidence of acute gastrointestinal hemorrhage. Large fecal stool burden within the colon. Aortic atherosclerosis with associated mesenteric and bilateral renal arterial disease. All 3 mesenteric arteries remain patent, with high-grade stenosis  at the origin of the SMA. Bilateral moderate iliac arterial disease with no high-grade stenosis or occlusion. Additional ancillary findings as above. Signed, Yvone Neu. Reyne Dumas, RPVI [facility] Electronically Signed   By: Gilmer Mor D.O.   On: 07/30/2019 11:21        Discharge Exam: Vitals:   08/11/19 2052 08/12/19 0448  BP: (!) 112/58 130/80  Pulse: 83 (!) 120  Resp: 16 16  Temp: 98.4 F (36.9 C) 98 F (36.7 C)  SpO2: 96% 95%   Vitals:   08/11/19 1355 08/11/19 2030 08/11/19 2052 08/12/19 0448  BP: 126/70  (!) 112/58 130/80  Pulse: 77  83 (!) 120  Resp: 20  16 16   Temp: 98.1 F (36.7 C)  98.4 F (36.9 C) 98 F (36.7 C)  TempSrc: Oral  Oral Oral  SpO2: 94% 95% 96% 95%  Weight:      Height:        General: Pt is alert, awake, not in acute distress Cardiovascular: RRR, S1/S2 +, no rubs, no gallops Respiratory: bibasilar rales, no wheeze Abdominal: Soft, NT, ND, bowel sounds + Extremities: no edema, no cyanosis   The results of significant diagnostics from this hospitalization (including imaging, microbiology, ancillary and laboratory) are listed below for reference.    Significant Diagnostic Studies: Nm Gi Blood Loss  Result Date: 08/12/2019 CLINICAL DATA:  Chronic anemia question GI blood loss EXAM: NUCLEAR MEDICINE GASTROINTESTINAL BLEEDING SCAN TECHNIQUE: Sequential abdominal images were obtained following intravenous administration of Tc-22m labeled red blood cells. RADIOPHARMACEUTICALS:  125.1 mCi Tc-16m pertechnetate in-vitro labeled red cells. COMPARISON:  None FINDINGS: Intermittent artifacts from the patient's RIGHT arm. Normal blood pool distribution of labeled red cells. No abnormal gastrointestinal localization of tracer identified through 2 hours of imaging to suggest active GI bleeding. Small amount of de labeled tracer within urinary bladder. IMPRESSION: Negative GI bleeding scan. Electronically  Signed   By: Ulyses Southward M.D.   On: 08/12/2019 10:49   Ct Angio Abd/pel W/ And/or W/o  Result Date: 07/30/2019 CLINICAL DATA:  78 year old female with history of GI bleed EXAM: CTA ABDOMEN AND PELVIS wITHOUT AND WITH CONTRAST TECHNIQUE: Multidetector CT imaging of the abdomen and pelvis was performed using the standard protocol during bolus administration of intravenous contrast. Multiplanar reconstructed images and MIPs were obtained and reviewed to evaluate the vascular anatomy. CONTRAST:  OMNIPAQUE IOHEXOL 350 MG/ML SOLN COMPARISON:  July 19, 2016 FINDINGS: VASCULAR Aorta: Atherosclerotic changes of the lower thoracic and the abdominal aorta. Greatest diameter of the thoracic aorta at the hiatus measures 3.2 cm. Greatest diameter of the abdominal aorta is suprarenal, measuring 3.1 cm at the level of the SMA origin. No dissection flap.  No periaortic fluid. Celiac: Atherosclerotic changes at the celiac artery origin with less than 50% stenosis. Branches remain patent. SMA: Dense atherosclerotic calcifications at the SMA origin with likely high-grade stenosis secondary to calcified plaque. Renals: On the right there are 2 renal arteries originating at the 10 o'clock position cranially and the 11 o'clock position caudally. Both of these demonstrate significant atherosclerotic changes with estimated 50% narrowing. On the left, the main renal artery originates at the 3 o'clock  position with atherosclerotic changes at the origin. There is a small accessory renal artery to the lower pole originating at the 2 o'clock position which remains patent. IMA: IMA is patent. Right lower extremity: Moderate atherosclerotic changes of the right iliac system. Diameter of the common iliac arteries 13 mm. Calcifications of the hypogastric artery which remains patent. External iliac artery is patent. Common femoral artery patent. Proximal profunda femoris and SFA patent. Left lower extremity: Moderate atherosclerotic  changes of the left iliac system. Diameter of the common iliac artery measures 11 mm. Hypogastric artery remains patent. External iliac artery is patent. Common femoral artery patent. Proximal profunda femoris and SFA are patent. Veins: Unremarkable appearance of the venous system. Review of the MIP images confirms the above findings. NON-VASCULAR Lower chest: No acute. Hepatobiliary: Redemonstration of small low-density cysts of left and right liver, similar distribution and size to the comparison CT of 2017. Gallbladder is decompressed, with no calcified cholelithiasis. Pancreas: Unremarkable Spleen: Unremarkable Adrenals/Urinary Tract: Unremarkable appearance of adrenal glands. Right: Perfusion symmetric to the left kidney. No hydronephrosis or nephrolithiasis. Unremarkable course of the right ureter. Left: Symmetric perfusion to the right. No hydronephrosis. No nephrolithiasis. Unremarkable course of the left ureter. Unremarkable appearance of the urinary bladder . Stomach/Bowel: No accumulation of contrast that with identify or confirm a source of gastrointestinal hemorrhage. The endoscopy capsule is present within the stomach lumen. Otherwise unremarkable stomach. Unremarkable appearance of small bowel. No evidence of obstruction. Normal appendix. large fecal stool burden throughout the colon with no focal wall thickening or significant inflammatory changes. Diverticular change present. Lymphatic: No lymphadenopathy. Mesenteric: There is trace fluid within the anatomic pelvis without focal inflammatory changes within the abdomen that may suggest a etiology. Reproductive: Hysterectomy Other: No hernia. Musculoskeletal: Surgical changes of left hip repair with incomplete remodeling/healing at the fracture site. Osteopenia. Multiple levels of vertebral augmentation, similar to prior. No new fracture identified. IMPRESSION: No acute abnormality, with no evidence of acute gastrointestinal hemorrhage. Large fecal  stool burden within the colon. Aortic atherosclerosis with associated mesenteric and bilateral renal arterial disease. All 3 mesenteric arteries remain patent, with high-grade stenosis at the origin of the SMA. Bilateral moderate iliac arterial disease with no high-grade stenosis or occlusion. Additional ancillary findings as above. Signed, Yvone Neu. Reyne Dumas, RPVI [facility] Radiology Electronically Signed   By: Gilmer Mor D.O.   On: 07/30/2019 11:21     Microbiology: Recent Results (from the past 240 hour(s))  SARS CORONAVIRUS 2 (Karim Aiello 6-24 HRS) Nasopharyngeal Nasopharyngeal Swab     Status: None   Collection Time: 08/10/19  3:35 PM   Specimen: Nasopharyngeal Swab  Result Value Ref Range Status   SARS Coronavirus 2 NEGATIVE NEGATIVE Final    Comment: (NOTE) SARS-CoV-2 target nucleic acids are NOT DETECTED. The SARS-CoV-2 RNA is generally detectable in upper and lower respiratory specimens during the acute phase of infection. Negative results do not preclude SARS-CoV-2 infection, do not rule out co-infections with other pathogens, and should not be used as the sole basis for treatment or other patient management decisions. Negative results must be combined with clinical observations, patient history, and epidemiological information. The expected result is Negative. Fact Sheet for Patients: HairSlick.no Fact Sheet for Healthcare Providers: quierodirigir.com This test is not yet approved or cleared by the [place] FDA and  has been authorized for detection and/or diagnosis of SARS-CoV-2 by FDA under an Emergency Use Authorization (EUA). This EUA will remain  in effect (meaning this  test can be used) for the duration of the COVID-19 declaration under Section 56 4(b)(1) of the Act, 21 U.S.C. section 360bbb-3(b)(1), unless the authorization is terminated or revoked  sooner. Performed at [facility], [address], [place], [place] [address]      Labs: Basic Metabolic Panel: Recent Labs  Lab 08/10/19 1509  NA 136  K 4.3  CL 100  CO2 26  GLUCOSE 126*  BUN 25*  CREATININE 1.03*  CALCIUM 8.6*   Liver Function Tests: Recent Labs  Lab 08/10/19 1509  AST 15  ALT 10  ALKPHOS 110  BILITOT 0.2*  PROT 6.8  ALBUMIN 3.6   No results for input(s): LIPASE, AMYLASE in the last 168 hours. No results for input(s): AMMONIA in the last 168 hours. CBC: Recent Labs  Lab 08/09/19 1425 08/10/19 1509 08/10/19 2124 08/11/19 0442 08/12/19 0548  WBC 9.0 8.8  --  9.1 7.9  HGB 6.4* 6.4* 8.5* 7.7* 8.0*  HCT 20.8* 22.7* 28.9* 25.9* 27.1*  MCV 85 91.9  --  89.0 88.6  PLT 254 274  --  240 243   Cardiac Enzymes: No results for input(s): CKTOTAL, CKMB, CKMBINDEX, TROPONINI in the last 168 hours. BNP: Invalid input(s): POCBNP CBG: No results for input(s): GLUCAP in the last 168 hours.  Time coordinating discharge:  36 minutes  Signed:  Catarina Hartshorn, DO [facility] Pager: [phone] 08/12/2019, 1:19 PM

## 2019-08-12 NOTE — Progress Notes (Addendum)
Patient transferred by bed for NM GI Blood Loss scan.

## 2019-08-12 NOTE — TOC Transition Note (Signed)
Transition of Care [facility]) - CM/SW Discharge Note   Patient Details  Name: Misty [name] MRN: [id number] Date of Birth: 1941/10/15  [facility] of [facility]) CM/SW Contact:  Boneta Lucks, RN Phone Number: 08/12/2019, 1:30 PM   Clinical Narrative:   Patient admitted for acute anemia. Patient also has a high risk for readmission score. Patient lives at home, not driving since hip fx, completed PT using a walker without difficulty. Husband provides transportation. Patient to follow up with Dr Laural Golden and has cardiology appointment next week. She has home 02 that she has never used, She is upset, does not want to pay for it, does not want it in her home. CM called Melissa with [facility] and left a message for them to pick up.       Barriers to Discharge: No Barriers Identified   Patient Goals and CMS Choice Patient states their goals for this hospitalization and ongoing recovery are:: to go home.      Discharge Placement    Home  Discharge Plan and Services       Readmission Risk Interventions Readmission Risk Prevention Plan 08/12/2019 07/13/2019 07/13/2019  Transportation Screening Complete - Complete  PCP or Specialist Appt within 3-5 Days - Complete -  [place] or [place] - - Patient refused  Social Work Consult for [place] Planning/Counseling - - Complete  Palliative Care Screening - - Complete  Medication Review Press photographer) Complete - Complete  PCP or Specialist appointment within 3-5 days of discharge Complete - -  [place] or Home Care Consult Complete - -  SW Recovery Care/Counseling Consult Complete - -  Palliative Care Screening Not Complete - -  [place] Not Complete - -  Some recent data might be hidden

## 2019-08-13 ENCOUNTER — Other Ambulatory Visit: Payer: Self-pay

## 2019-08-13 DIAGNOSIS — I5022 Chronic systolic (congestive) heart failure: Secondary | ICD-10-CM

## 2019-08-14 LAB — TYPE AND SCREEN
ABO/RH(D): O POS
Antibody Screen: NEGATIVE
Unit division: 0
Unit division: 0

## 2019-08-14 LAB — BPAM RBC
Blood Product Expiration Date: 202010242359
Blood Product Expiration Date: 202010272359
ISSUE DATE / TIME: 202009221635
Unit Type and Rh: 5100
Unit Type and Rh: 5100

## 2019-08-16 ENCOUNTER — Telehealth (INDEPENDENT_AMBULATORY_CARE_PROVIDER_SITE_OTHER): Payer: Self-pay | Admitting: *Deleted

## 2019-08-16 ENCOUNTER — Other Ambulatory Visit (INDEPENDENT_AMBULATORY_CARE_PROVIDER_SITE_OTHER): Payer: Self-pay | Admitting: *Deleted

## 2019-08-16 ENCOUNTER — Other Ambulatory Visit: Payer: Self-pay | Admitting: *Deleted

## 2019-08-16 DIAGNOSIS — K922 Gastrointestinal hemorrhage, unspecified: Secondary | ICD-10-CM | POA: Insufficient documentation

## 2019-08-16 DIAGNOSIS — D649 Anemia, unspecified: Secondary | ICD-10-CM

## 2019-08-16 NOTE — Telephone Encounter (Signed)
Call patient she has question about getting blood

## 2019-08-16 NOTE — Patient Outreach (Signed)
[facility]) Care Management  08/16/2019  CLOTILE WHITTINGTON July 02, 1941 [id number]  Telephone Assessment-Unsuccessful  Referral received-08/13/2019 Initial Outreach-08/16/2019  RN attempted outreach however unsuccessful. RN able to leave a HIPAA approved voice message requesting a call back.   Will follow up in 4 business for pending services.  Raina Mina, RN Care Management Coordinator [place] Office [phone]

## 2019-08-16 NOTE — Telephone Encounter (Signed)
Patent was called and she states that she can not understand why she keeps coming to the hospital only to get 1 unit of blood. This does not help get her to where she needs to do. Patient voices that she cannot keep coming to the hospital. She knows that she is to have a test Friday to see where the bleeding is coming from , but what if they cant find out why from that.  She also ask if she could take a 81 mg ASA instead of blood thinne? Patient says that she has questions but just is not sure how to ask them.  Patient was told that this would be shared with Dr.Rehman , and see if he wold call her back.

## 2019-08-17 DIAGNOSIS — I429 Cardiomyopathy, unspecified: Secondary | ICD-10-CM | POA: Insufficient documentation

## 2019-08-17 DIAGNOSIS — I340 Nonrheumatic mitral (valve) insufficiency: Secondary | ICD-10-CM | POA: Insufficient documentation

## 2019-08-17 NOTE — Progress Notes (Signed)
Cardiology Office Note   Date:  08/18/2019   ID:  Misty, [name] Jul 22, 1941, MRN [id number]  PCP:  Baruch Gouty, FNP  Cardiologist:   Minus Breeding, MD Referring:  Baruch Gouty, FNP  Chief Complaint  Patient presents with  . Atrial Fibrillation      History of Present Illness: Misty [name] is a 78 y.o. female who presents for follow-up after recent hospitalizations with GI bleed.  She has a history of cardiomyopathy.  She has mild nonobstructive coronary disease.  This was when she was in the hospital with respiratory failure.  She had pleural effusions. We have been managing her reduced ejection fraction medically.  She was in the hospital in August with anemia.   She had upper and lower endoscopy but they could not find a source.    I did review the GI records and actually sent a message to the hospitalist who cared for her.  She had some erosive esophagitis and a hiatal hernia that was not felt to be contributory to her anemia.  It was decided that she would go home off of iron pills but that she could go home on anticoagulation.  She was back in the hospital a few weeks later with  symptomatic anemia and required transfusion.  They attempted capsule endoscopy but this was incomplete.  There was no source of GI bleeding identified.  She was instructed to stay off of [place] until the 13th.  She restarted this.  After my follow up visit with her I check a follow up Hgb and her count was down again.  She has since been taken off of her DOAC and has required blood transfusions.  She is scheduled for follow up GI evaluation.    She was anemic again after the last visit and went to the hospital to get transfusion.  She is actually admitted.  I reviewed these records again.  She had a bleeding scan that was negative for source of bleeding.  Her Eliquis was held but she was told to restart it.  She is not having any dark stools currently.  She is not having any red blood.  She denies  any new shortness of breath, PND or orthopnea.  She has no palpitations, presyncope or syncope.  She gets around with her walker.  Past Medical History:  Diagnosis Date  . Anxiety    takes Xanax daily  . Arthritis    back  . Atrial fibrillation with RVR ([place]) 03/25/2019  . Cataract   . Chronic back pain   . Constipation    OTC stool softener prn  . COPD (chronic obstructive pulmonary disease) ([place])   . Depression   . Diverticulosis   . GERD (gastroesophageal reflux disease)    takes Ranidine daily  . Hemorrhoids   . History of bronchitis   . History of migraine    many yrs ago  . History of shingles   . Hyperlipidemia   . Insomnia   . Migraines   . Osteoporosis   . PONV (postoperative nausea and vomiting)     Past Surgical History:  Procedure Laterality Date  . ABDOMINAL HYSTERECTOMY    . BIOPSY  07/12/2019   Procedure: BIOPSY;  Surgeon: Rogene Houston, MD;  [facility];  Service: Endoscopy;;  duodenum  . COLONOSCOPY    . COLONOSCOPY N/A 02/17/2015   Procedure: COLONOSCOPY;  Surgeon: Rogene Houston, MD;  [facility];  Service:  Endoscopy;  Laterality: N/A;  110n - moved to 11:15 - Ann to notify pt  . COLONOSCOPY N/A 07/12/2019   Procedure: COLONOSCOPY;  Surgeon: Rogene Houston, MD;  [facility];  Service: Endoscopy;  Laterality: N/A;  . ESOPHAGOGASTRODUODENOSCOPY    . ESOPHAGOGASTRODUODENOSCOPY N/A 01/29/2016   Procedure: ESOPHAGOGASTRODUODENOSCOPY (EGD);  Surgeon: Rogene Houston, MD;  [facility];  Service: Endoscopy;  Laterality: N/A;  . ESOPHAGOGASTRODUODENOSCOPY N/A 10/14/2017   Procedure: ESOPHAGOGASTRODUODENOSCOPY (EGD);  Surgeon: Rogene Houston, MD;  [facility];  Service: Endoscopy;  Laterality: N/A;  730  . ESOPHAGOGASTRODUODENOSCOPY N/A 07/12/2019   Procedure: ESOPHAGOGASTRODUODENOSCOPY (EGD);  Surgeon: Rogene Houston, MD;  [facility];  Service: Endoscopy;  Laterality: N/A;  . EYE  SURGERY Right 3/15   cataracts  . GIVENS CAPSULE STUDY N/A 07/29/2019   Procedure: GIVENS CAPSULE STUDY;  Surgeon: Rogene Houston, MD;  [facility];  Service: Endoscopy;  Laterality: N/A;  . INTRAMEDULLARY (IM) NAIL INTERTROCHANTERIC Left 01/07/2019   Procedure: INTRAMEDULLARY (IM) NAIL INTERTROCHANTRIC;  Surgeon: Carole Civil, MD;  [facility];  Service: Orthopedics;  Laterality: Left;  . KYPHOPLASTY N/A 07/08/2014   Procedure: Thoracic Eleven Kyphoplasty;  Surgeon: Hosie Spangle, MD;  Location: [place] [facility];  Service: Neurosurgery;  Laterality: N/A;  . left hip surgery    . LUMBAR LAMINECTOMY/DECOMPRESSION MICRODISCECTOMY Bilateral 05/20/2013   Procedure: LUMBAR LAMINECTOMY/DECOMPRESSION MICRODISCECTOMY 1 LEVEL;  Surgeon: Hosie Spangle, MD;  Location: [place] [facility];  Service: Neurosurgery;  Laterality: Bilateral;  Bilateral Lumbar four-five laminotomy and left lumbar four-five microdiskectomy  . RIGHT/LEFT HEART CATH AND CORONARY ANGIOGRAPHY N/A 03/31/2019   Procedure: RIGHT/LEFT HEART CATH AND CORONARY ANGIOGRAPHY;  Surgeon: Sherren Mocha, MD;  Location: [facility];  Service: Cardiovascular;  Laterality: N/A;  . SPINE SURGERY     Dr Carloyn Manner -  vertebraplasty     Current Outpatient Medications  Medication Sig Dispense Refill  . acetaminophen (TYLENOL) 500 MG tablet Take 500 mg by mouth every 8 (eight) hours as needed for mild pain, moderate pain or headache.     . albuterol (PROVENTIL HFA;VENTOLIN HFA) 108 (90 Base) MCG/ACT inhaler Inhale 2 puffs into the lungs every 6 (six) hours as needed for wheezing or shortness of breath (cough). 1 Inhaler 0  . ALPRAZolam (XANAX) 0.5 MG tablet Take 1 tablet (0.5 mg total) by mouth 2 (two) times daily as needed for anxiety. (Patient taking differently: Take 0.5 mg by mouth 2 (two) times daily. ) 60 tablet 3  . apixaban (ELIQUIS) 5 MG TABS tablet Take 1 tablet (5 mg total) by mouth 2 (two) times daily. Restart on 08/16/19  60 tablet   . bisoprolol (ZEBETA) 5 MG tablet Take 0.5 tablets (2.5 mg total) by mouth daily. 30 tablet 6  . cholecalciferol (VITAMIN D) 1000 UNITS tablet Take 2,000 Units by mouth daily.     Marland Kitchen escitalopram (LEXAPRO) 20 MG tablet TAKE ONE TABLET AT BEDTIME (Patient taking differently: Take 20 mg by mouth at bedtime. ) 30 tablet 3  . furosemide (LASIX) 20 MG tablet Take 1 tablet (20 mg total) by mouth daily. 90 tablet 3  . Multiple Vitamin (MULTIVITAMIN WITH MINERALS) TABS tablet Take 1 tablet by mouth daily. Centrum Silver    . pantoprazole (PROTONIX) 40 MG tablet Take 1 tablet (40 mg total) by mouth 2 (two) timesdaily before a meal. (Patient taking differently: Take 40 mg by mouth 2 (two) times daily. ) 60  tablet 5  . potassium chloride SA (K-DUR) 20 MEQ tablet Take 1 tablet (20 mEq total) by mouth daily for 30 days. 30 tablet 1  . vitamin B-12 (CYANOCOBALAMIN) 1000 MCG tablet Take 1,000 mcg by mouth daily.     No current facility-administered medications for this visit.     ROS:  Please see the history of present illness.   Otherwise, review of systems are positive for none.   All other systems are reviewed and negative.    PHYSICAL EXAM: VS:  BP 102/61   Pulse (!) 102   Ht 5' 7"  (1.702 m)   Wt 117 lb (53.1 kg)   BMI 18.32 kg/m  , BMI Body mass index is 18.32 kg/m. GEN:  No distress, frail NECK:  No jugular venous distention at 90 degrees, waveform within normal limits, carotid upstroke brisk and symmetric, no bruits, no thyromegaly LYMPHATICS:  No cervical adenopathy LUNGS:  Clear to auscultation bilaterally BACK:  No CVA tenderness CHEST:  Unremarkable HEART:  S1 and S2 within normal limits, no S3, no S4, no clicks, no rubs, no murmurs ABD:  Positive bowel sounds normal in frequency in pitch, no bruits, no rebound, no guarding, unable to assess midline mass or bruit with the patient seated. EXT:  2 plus pulses throughout, no edema, no cyanosis no clubbing SKIN:  No rashes no  nodules NEURO:  Cranial nerves II through XII grossly intact, motor grossly intact throughout PSYCH:  Cognitively intact, oriented to person place and time   EKG:  EKG is not ordered today.      Recent Labs: 04/02/2019: Magnesium 2.1 07/09/2019: TSH 2.340 07/10/2019: B Natriuretic Peptide 365.0 08/10/2019: ALT 10; BUN 25; Creatinine, Ser 1.03; Potassium 4.3; Sodium 136 08/12/2019: Hemoglobin 8.0; Platelets 243    Lipid Panel    Component Value Date/Time   CHOL 125 07/09/2019 1256   TRIG 101 07/09/2019 1256   TRIG 72 01/09/2015 0948   HDL 45 07/09/2019 1256   HDL 52 01/09/2015 0948   CHOLHDL 2.8 07/09/2019 1256   CHOLHDL 2.7 03/26/2019 0224   VLDL 13 03/26/2019 0224   LDLCALC 60 07/09/2019 1256   LDLCALC 73 08/25/2014 0943      Wt Readings from Last 3 Encounters:  08/18/19 117 lb (53.1 kg)  08/10/19 118 lb 6.2 oz (53.7 kg)  08/04/19 117 lb (53.1 kg)      Other studies Reviewed: Additional studies/ records that were reviewed today include.  Hospital records Review of the above records demonstrates:  NA  ASSESSMENT AND PLAN:  CARDIOMYOPATHY:     Today I am going to leave her on low-dose of bisoprolol.  Other meds were titrated down because of hypotension.  MR: This has been moderate on echo.  No further imaging.   ATRIAL FIB:    Ms. KARYNN DEBLASI has a CHA2DS2 - VASc score of 5.     She is due to have a capsule endoscopy.  She and I again had a long discussion about the risks and benefits of continuing anticoagulation and she is more afraid of having a stroke off of Eliquis than the need to have continued blood transfusions and surveillance.  She understands that aspirin really does not have a role in this as it does not reduce the risk of stroke but would increase the risk of bleeding versus no therapy at all.  Again for now she chooses to continue the Eliquis.   I did call and speak with her primary provider about this.  ACUTE SYSTOLIC HF:  She seems to be euvolemic.   No change in therapy. recent chest.  TOBACCO USE: She is cutting back but cannot quit smoking.  She does not want other therapy for this.  Current medicines are reviewed at length with the patient today.  The patient does not have concerns regarding medicines.  The following changes have been made:  no change  Labs/ tests ordered today include: None  No orders of the defined types were placed in this encounter.    Disposition:   FU with in two months.     Signed, Minus Breeding, MD  08/18/2019 2:11 PM    [facility]

## 2019-08-18 ENCOUNTER — Ambulatory Visit (INDEPENDENT_AMBULATORY_CARE_PROVIDER_SITE_OTHER): Payer: Medicare Other | Admitting: Cardiology

## 2019-08-18 ENCOUNTER — Other Ambulatory Visit: Payer: Self-pay

## 2019-08-18 ENCOUNTER — Encounter: Payer: Self-pay | Admitting: Cardiology

## 2019-08-18 VITALS — BP 102/61 | HR 102 | Ht 67.0 in | Wt 117.0 lb

## 2019-08-18 DIAGNOSIS — I480 Paroxysmal atrial fibrillation: Secondary | ICD-10-CM | POA: Diagnosis not present

## 2019-08-18 DIAGNOSIS — I429 Cardiomyopathy, unspecified: Secondary | ICD-10-CM | POA: Diagnosis not present

## 2019-08-18 DIAGNOSIS — I340 Nonrheumatic mitral (valve) insufficiency: Secondary | ICD-10-CM | POA: Diagnosis not present

## 2019-08-18 DIAGNOSIS — D62 Acute posthemorrhagic anemia: Secondary | ICD-10-CM | POA: Diagnosis not present

## 2019-08-18 NOTE — Patient Instructions (Signed)
Medication Instructions:  The current medical regimen is effective;  continue present plan and medications.  If you need a refill on your cardiac medications before your next appointment, please call your pharmacy.   Lab work: Please have blood work at [facility].  They should contact you about when but Dr Percival Spanish says Wednesday next week.  If you have labs (blood work) drawn today and your tests are completely normal, you will receive your results only by: Marland Kitchen MyChart Message (if you have MyChart) OR . A paper copy in the mail If you have any lab test that is abnormal or we need to change your treatment, we will call you to review the results.  Follow-Up: Follow up in 2 months with Dr Percival Spanish.  Thank you for choosing [facility]!!

## 2019-08-20 ENCOUNTER — Encounter (HOSPITAL_COMMUNITY)
Admission: RE | Disposition: A | Discharge status: Home / Self Care | Payer: Self-pay | Source: Home / Self Care | Attending: Internal Medicine

## 2019-08-20 ENCOUNTER — Other Ambulatory Visit: Payer: Self-pay | Admitting: *Deleted

## 2019-08-20 ENCOUNTER — Ambulatory Visit (HOSPITAL_COMMUNITY)
Admission: RE | Admit: 2019-08-20 | Discharge: 2019-08-20 | Disposition: A | Discharge status: Home / Self Care | Payer: Medicare Other | Attending: Internal Medicine | Admitting: Internal Medicine

## 2019-08-20 DIAGNOSIS — M479 Spondylosis, unspecified: Secondary | ICD-10-CM | POA: Diagnosis not present

## 2019-08-20 DIAGNOSIS — Z881 Allergy status to other antibiotic agents status: Secondary | ICD-10-CM | POA: Diagnosis not present

## 2019-08-20 DIAGNOSIS — J449 Chronic obstructive pulmonary disease, unspecified: Secondary | ICD-10-CM | POA: Diagnosis not present

## 2019-08-20 DIAGNOSIS — Z8719 Personal history of other diseases of the digestive system: Secondary | ICD-10-CM | POA: Insufficient documentation

## 2019-08-20 DIAGNOSIS — K9289 Other specified diseases of the digestive system: Secondary | ICD-10-CM | POA: Diagnosis not present

## 2019-08-20 DIAGNOSIS — Z8249 Family history of ischemic heart disease and other diseases of the circulatory system: Secondary | ICD-10-CM | POA: Insufficient documentation

## 2019-08-20 DIAGNOSIS — Z885 Allergy status to narcotic agent status: Secondary | ICD-10-CM | POA: Diagnosis not present

## 2019-08-20 DIAGNOSIS — K2210 Ulcer of esophagus without bleeding: Secondary | ICD-10-CM | POA: Insufficient documentation

## 2019-08-20 DIAGNOSIS — I4891 Unspecified atrial fibrillation: Secondary | ICD-10-CM | POA: Diagnosis not present

## 2019-08-20 DIAGNOSIS — K922 Gastrointestinal hemorrhage, unspecified: Secondary | ICD-10-CM

## 2019-08-20 DIAGNOSIS — Z888 Allergy status to other drugs, medicaments and biological substances status: Secondary | ICD-10-CM | POA: Insufficient documentation

## 2019-08-20 DIAGNOSIS — F419 Anxiety disorder, unspecified: Secondary | ICD-10-CM | POA: Diagnosis not present

## 2019-08-20 DIAGNOSIS — F17210 Nicotine dependence, cigarettes, uncomplicated: Secondary | ICD-10-CM | POA: Diagnosis not present

## 2019-08-20 DIAGNOSIS — K2100 Gastro-esophageal reflux disease with esophagitis, without bleeding: Secondary | ICD-10-CM | POA: Diagnosis not present

## 2019-08-20 DIAGNOSIS — Z7901 Long term (current) use of anticoagulants: Secondary | ICD-10-CM | POA: Insufficient documentation

## 2019-08-20 DIAGNOSIS — Z79899 Other long term (current) drug therapy: Secondary | ICD-10-CM | POA: Insufficient documentation

## 2019-08-20 DIAGNOSIS — Z886 Allergy status to analgesic agent status: Secondary | ICD-10-CM | POA: Diagnosis not present

## 2019-08-20 HISTORY — PX: GIVENS CAPSULE STUDY: SHX5432

## 2019-08-20 SURGERY — IMAGING PROCEDURE, GI TRACT, INTRALUMINAL, VIA CAPSULE

## 2019-08-20 NOTE — Telephone Encounter (Signed)
I have explained to patient on multiple occasions and we have not found a source of GI blood loss despite multiple studies and she is suspected to be bleeding from small bowel which is usually the case in situations like this Will talk with her again when small bowel study completed and reviewed.

## 2019-08-20 NOTE — Patient Outreach (Signed)
[facility]) Care Management  08/20/2019  JACQUELINA HEWINS 16-Aug-1941 [id number]  Telephone Assessment/s/p ED visit   Outreach #2 unsuccessful today. Pt noted with an ED visit today for endoscopy however discharged earlier this morning. RN attempted outreach however unsuccessful and unable to leave a HIPAA approved message.   Will continue outreach attempts for [facility] pending services and reach out once again next week.  Raina Mina, RN Care Management Coordinator [place] Office [phone]

## 2019-08-21 NOTE — H&P (Signed)
Misty [name] is an 78 y.o. female.   Chief Complaint: Patient is here for small bowel given capsule study HPI: Patient is 78 year old Caucasian female with history of GI bleed who is requiring frequent blood transfusion.  She has A. fib and has been on anticoagulant.  In the last few weeks she has undergone EGD colonoscopy CTA abdomen and pelvis and small bowel given capsule study was attempted inpatient but capsule never left the stomach.  She is now returning for outpatient small bowel given capsule study.  Endoscopic placement of capsule into small bowel was recommended but patient declines.  Past Medical History:  Diagnosis Date  . Anxiety    takes Xanax daily  . Arthritis    back  . Atrial fibrillation with RVR ([place]) 03/25/2019  . Cataract   . Chronic back pain   . Constipation    OTC stool softener prn  . COPD (chronic obstructive pulmonary disease) ([place])   . Depression   . Diverticulosis   . GERD (gastroesophageal reflux disease)    takes Ranidine daily  . Hemorrhoids   . History of bronchitis   . History of migraine    many yrs ago  . History of shingles   . Hyperlipidemia   . Insomnia   . Migraines   . Osteoporosis   . PONV (postoperative nausea and vomiting)     Past Surgical History:  Procedure Laterality Date  . ABDOMINAL HYSTERECTOMY    . BIOPSY  07/12/2019   Procedure: BIOPSY;  Surgeon: Rogene Houston, MD;  [facility];  Service: Endoscopy;;  duodenum  . COLONOSCOPY    . COLONOSCOPY N/A 02/17/2015   Procedure: COLONOSCOPY;  Surgeon: Rogene Houston, MD;  [facility];  Service: Endoscopy;  Laterality: N/A;  110n - moved to 11:15 - Ann to notify pt  . COLONOSCOPY N/A 07/12/2019   Procedure: COLONOSCOPY;  Surgeon: Rogene Houston, MD;  [facility];  Service: Endoscopy;  Laterality: N/A;  . ESOPHAGOGASTRODUODENOSCOPY    . ESOPHAGOGASTRODUODENOSCOPY N/A 01/29/2016   Procedure: ESOPHAGOGASTRODUODENOSCOPY (EGD);  Surgeon: Rogene Houston, MD;  [facility];  Service: Endoscopy;  Laterality: N/A;  . ESOPHAGOGASTRODUODENOSCOPY N/A 10/14/2017   Procedure: ESOPHAGOGASTRODUODENOSCOPY (EGD);  Surgeon: Rogene Houston, MD;  [facility];  Service: Endoscopy;  Laterality: N/A;  730  . ESOPHAGOGASTRODUODENOSCOPY N/A 07/12/2019   Procedure: ESOPHAGOGASTRODUODENOSCOPY (EGD);  Surgeon: Rogene Houston, MD;  [facility];  Service: Endoscopy;  Laterality: N/A;  . EYE SURGERY Right 3/15   cataracts  . GIVENS CAPSULE STUDY N/A 07/29/2019   Procedure: GIVENS CAPSULE STUDY;  Surgeon: Rogene Houston, MD;  [facility];  Service: Endoscopy;  Laterality: N/A;  . INTRAMEDULLARY (IM) NAIL INTERTROCHANTERIC Left 01/07/2019   Procedure: INTRAMEDULLARY (IM) NAIL INTERTROCHANTRIC;  Surgeon: Carole Civil, MD;  [facility]: [facility];  Service: Orthopedics;  Laterality: Left;  . KYPHOPLASTY N/A 07/08/2014   Procedure: Thoracic Eleven Kyphoplasty;  Surgeon: Hosie Spangle, MD;  Location: [place] [facility];  Service: Neurosurgery;  Laterality: N/A;  . left hip surgery    . LUMBAR LAMINECTOMY/DECOMPRESSION MICRODISCECTOMY Bilateral 05/20/2013   Procedure: LUMBAR LAMINECTOMY/DECOMPRESSION MICRODISCECTOMY 1 LEVEL;  Surgeon: Hosie Spangle, MD;  Location: [place] [facility];  Service: Neurosurgery;  Laterality: Bilateral;  Bilateral Lumbar four-five laminotomy and left lumbar four-five microdiskectomy  . RIGHT/LEFT HEART CATH AND CORONARY ANGIOGRAPHY N/A 03/31/2019   Procedure: RIGHT/LEFT HEART CATH AND CORONARY ANGIOGRAPHY;  Surgeon:  Sherren Mocha, MD;  Location: [place] [facility];  Service: Cardiovascular;  Laterality: N/A;  . SPINE SURGERY     Dr Carloyn Manner -  vertebraplasty    Family History  Problem Relation Age of Onset  . Hypertension Mother   . Heart attack Mother   . Macular degeneration Father   . Heart disease Father   . Cirrhosis Son   . Heart disease Son   . Colon cancer Neg Hx    Social History:   reports that she has been smoking cigarettes. She started smoking about 60 years ago. She has a 52.00 pack-year smoking history. She has never used smokeless tobacco. She reports that she does not drink alcohol or use drugs.  Allergies:  Allergies  Allergen Reactions  . Codeine Nausea And Vomiting  . Tramadol Nausea And Vomiting  . Asa [Aspirin] Other (See Comments)    Patient is unaware of allergy  . Azithromycin Other (See Comments)    Patient is unaware of allergy  . Celebrex [Celecoxib] Other (See Comments)    Irritated stomach   . Cymbalta [Duloxetine Hcl] Other (See Comments)    Felt groggy  . Prednisone Rash    She has seen the podiatrist and he had injected cortisone in her feet and she had also taken a peel at home. She had a severe reaction to her face with a rash and had to take Benadryl to resolve the symptom. She actually did get more cortisone shots, but no additional reactions to the shots.  . Vioxx [Rofecoxib] Other (See Comments)    Unknown  . Zelnorm [Tegaserod] Other (See Comments)    Patient is unaware of allergy  . Zocor [Simvastatin] Other (See Comments)    Patient is unaware of allergy    No medications prior to admission.    No results found for this or any previous visit (from the past 48 hour(s)). No results found.  ROS  Height 5' 8"  (1.727 m), weight 53.1 kg. Physical Exam   Assessment/Plan GI bleed of occult origin. Negative work-up as above. Small bowel given capsule study looking for source of GI blood loss.     Hildred Laser, MD 08/21/2019, 9:14 AM

## 2019-08-22 ENCOUNTER — Other Ambulatory Visit (INDEPENDENT_AMBULATORY_CARE_PROVIDER_SITE_OTHER): Payer: Self-pay | Admitting: Internal Medicine

## 2019-08-22 MED ORDER — METOCLOPRAMIDE HCL 5 MG PO TABS: 5.0000 mg | ORAL_TABLET | Freq: Three times a day (TID) | ORAL | 2 refills | Status: DC

## 2019-08-22 NOTE — Op Note (Signed)
Small Bowel [place] Capsule Study Procedure date: 08/20/2019.  Referring Provider:  Orson Eva, DO PCP:  Dr. Baruch Gouty, FNP  Indication for procedure:    Patient is 78 year old Caucasian female with recurrent GI bleed requiring multiple hospitalization with blood transfusion in the setting of anticoagulation for atrial fibrillation.  She was hospitalized on 07/12/2019 and underwent EGD and colonoscopy and no bleeding lesion was identified.  She was noted to have grade a reflux esophagitis without stigmata of bleeding.  On her second admission small bowel given capsule study was performed but given capsule never left the stomach.  CTA abdomen and pelvis was normal.  She was briefly hospitalized last week and received transfusion.  Her stool was heme positive.  GI bleeding scan was negative.  Plans were made for outpatient evaluation for small bowel given capsule study.  Endoscopic placement of small bowel given capsule was recommended the patient wanted to proceed with study without EGD. Patient is advised to take metoclopramide 10 mg this morning which she took.  She has been off p.o. iron for several days.  Findings:   Patient was able to swallow given capsule without any difficulty. Study duration 8 hours 3 minutes and 55 seconds. Study is incomplete as the capsule never left the stomach. Multiple erosions noted at distal esophagus with some food debris. Esophageal transit time 2 hours 3 minutes and 15 seconds. Food debris noted in stomach along with coffee ground material seen on multiple images including images at 01/21/2004, 03:08:33, 06:55:32 and 07:24:05. Prominent gastric folds with telangiectasia and speck of blood noted on images 03:37:50 and 07:33:48.    First Gastric image: 2 hrs 3 min and 15 sec First Duodenal image: Not reached First Ileo-Cecal Valve image: Not reached First Cecal image: Not reached Gastric Passage time: Cannot be calculated Small Bowel Passage time: Cannot  be calculated  Summary & Recommendations:  Once again small bowel given capsule study is incomplete as given capsule never left the stomach but study does provide clinically relevant/important information. Prolonged esophageal transit time suggesting esophageal dysmotility. Erosive reflux esophagitis appears to have worsened since the EGD of 07/12/2019. Given capsule stayed in the stomach for 6 hours consistent with gastroparesis. Coffee-ground material noted in the stomach along with abnormal gastric mucosa suggesting portal gastropathy or telangiectasia with 2 specks of fresh blood. Please note the patient does not have pyloric stenosis based on recent EGD of 07/12/2019.  These findings were reviewed with patient earlier today. Patient advised to go back on metoclopramide 5 mg by mouth 30 minutes before each meal. If her hemoglobin drops again would consider EGD with endoscopic placement of given capsule and will also discuss with Dr. Percival Spanish about changing her anticoagulant.

## 2019-08-24 ENCOUNTER — Other Ambulatory Visit: Payer: Self-pay | Admitting: *Deleted

## 2019-08-24 NOTE — Patient Outreach (Signed)
[facility]) Care Management  08/24/2019  VEENA STURGESS 06-Jan-1941 [id number]    Telephone Assessment-Unsuccessful (2nd attempt)  RN attempted an outreach call to pt however remains unsuccessful. RN unable to leave a HIPAA voice message today. Note RN has sent pt an outreach letter abs awaiting a response.  Will outreach once again within the next 4 business days for pending services.  Raina Mina, RN Care Management Coordinator [place] Office [phone]

## 2019-08-25 ENCOUNTER — Other Ambulatory Visit: Payer: Medicare Other

## 2019-08-25 ENCOUNTER — Encounter (HOSPITAL_COMMUNITY): Payer: Self-pay | Admitting: Internal Medicine

## 2019-08-25 ENCOUNTER — Other Ambulatory Visit: Payer: Self-pay

## 2019-08-25 DIAGNOSIS — D649 Anemia, unspecified: Secondary | ICD-10-CM

## 2019-08-25 DIAGNOSIS — D509 Iron deficiency anemia, unspecified: Secondary | ICD-10-CM

## 2019-08-25 DIAGNOSIS — E876 Hypokalemia: Secondary | ICD-10-CM | POA: Diagnosis not present

## 2019-08-26 ENCOUNTER — Telehealth: Payer: Self-pay | Admitting: Gastroenterology

## 2019-08-26 ENCOUNTER — Other Ambulatory Visit: Payer: Self-pay

## 2019-08-26 ENCOUNTER — Encounter (HOSPITAL_COMMUNITY): Payer: Self-pay

## 2019-08-26 ENCOUNTER — Observation Stay (HOSPITAL_COMMUNITY)
Admission: EM | Admit: 2019-08-26 | Discharge: 2019-08-27 | Disposition: A | Discharge status: Home / Self Care | Payer: Medicare Other | Attending: Internal Medicine | Admitting: Internal Medicine

## 2019-08-26 DIAGNOSIS — Z20828 Contact with and (suspected) exposure to other viral communicable diseases: Secondary | ICD-10-CM | POA: Diagnosis not present

## 2019-08-26 DIAGNOSIS — D62 Acute posthemorrhagic anemia: Secondary | ICD-10-CM | POA: Diagnosis not present

## 2019-08-26 DIAGNOSIS — F17210 Nicotine dependence, cigarettes, uncomplicated: Secondary | ICD-10-CM | POA: Insufficient documentation

## 2019-08-26 DIAGNOSIS — D500 Iron deficiency anemia secondary to blood loss (chronic): Principal | ICD-10-CM | POA: Diagnosis present

## 2019-08-26 DIAGNOSIS — K922 Gastrointestinal hemorrhage, unspecified: Secondary | ICD-10-CM

## 2019-08-26 DIAGNOSIS — J449 Chronic obstructive pulmonary disease, unspecified: Secondary | ICD-10-CM | POA: Insufficient documentation

## 2019-08-26 DIAGNOSIS — R531 Weakness: Secondary | ICD-10-CM | POA: Diagnosis not present

## 2019-08-26 DIAGNOSIS — I5022 Chronic systolic (congestive) heart failure: Secondary | ICD-10-CM | POA: Diagnosis not present

## 2019-08-26 DIAGNOSIS — D649 Anemia, unspecified: Secondary | ICD-10-CM

## 2019-08-26 LAB — CBC WITH DIFFERENTIAL/PLATELET
Abs Immature Granulocytes: 0.04 10*3/uL (ref 0.00–0.07)
Basophils Absolute: 0.1 10*3/uL (ref 0.0–0.2)
Basophils Absolute: 0 10*3/uL (ref 0.0–0.1)
Basophils Relative: 0 %
Basos: 1 %
EOS (ABSOLUTE): 0.6 10*3/uL — ABNORMAL HIGH (ref 0.0–0.4)
Eos: 7 %
Eosinophils Absolute: 0.6 10*3/uL — ABNORMAL HIGH (ref 0.0–0.5)
Eosinophils Relative: 9 %
HCT: 17.1 % — ABNORMAL LOW (ref 36.0–46.0)
Hematocrit: 17.7 % — CL (ref 34.0–46.6)
Hemoglobin: 5.3 g/dL — CL (ref 11.1–15.9)
Hemoglobin: 4.8 g/dL — CL (ref 12.0–15.0)
Immature Grans (Abs): 0.1 10*3/uL (ref 0.0–0.1)
Immature Granulocytes: 1 %
Immature Granulocytes: 1 %
Lymphocytes Absolute: 1.2 10*3/uL (ref 0.7–3.1)
Lymphocytes Relative: 10 %
Lymphs Abs: 0.7 10*3/uL (ref 0.7–4.0)
Lymphs: 13 %
MCH: 24.9 pg — ABNORMAL LOW (ref 26.6–33.0)
MCH: 24.9 pg — ABNORMAL LOW (ref 26.0–34.0)
MCHC: 29.9 g/dL — ABNORMAL LOW (ref 31.5–35.7)
MCHC: 28.1 g/dL — ABNORMAL LOW (ref 30.0–36.0)
MCV: 83 fL (ref 79–97)
MCV: 88.6 fL (ref 80.0–100.0)
Monocytes Absolute: 0.6 10*3/uL (ref 0.1–0.9)
Monocytes Absolute: 0.6 10*3/uL (ref 0.1–1.0)
Monocytes Relative: 9 %
Monocytes: 7 %
Neutro Abs: 5.2 10*3/uL (ref 1.7–7.7)
Neutrophils Absolute: 6.3 10*3/uL (ref 1.4–7.0)
Neutrophils Relative %: 71 %
Neutrophils: 71 %
Platelets: 280 10*3/uL (ref 150–450)
Platelets: 260 10*3/uL (ref 150–400)
RBC: 2.13 x10E6/uL — CL (ref 3.77–5.28)
RBC: 1.93 MIL/uL — ABNORMAL LOW (ref 3.87–5.11)
RDW: 14.9 % (ref 11.7–15.4)
RDW: 15.8 % — ABNORMAL HIGH (ref 11.5–15.5)
WBC: 8.7 10*3/uL (ref 3.4–10.8)
WBC: 7.2 10*3/uL (ref 4.0–10.5)
nRBC: 0 % (ref 0.0–0.2)

## 2019-08-26 LAB — SARS CORONAVIRUS 2 BY RT PCR (HOSPITAL ORDER, PERFORMED IN ~~LOC~~ HOSPITAL LAB): SARS Coronavirus 2: NEGATIVE

## 2019-08-26 LAB — CMP14+EGFR
ALT: 8 IU/L (ref 0–32)
AST: 11 IU/L (ref 0–40)
Albumin/Globulin Ratio: 1.8 (ref 1.2–2.2)
Albumin: 3.7 g/dL (ref 3.7–4.7)
Alkaline Phosphatase: 133 IU/L — ABNORMAL HIGH (ref 39–117)
BUN/Creatinine Ratio: 19 (ref 12–28)
BUN: 19 mg/dL (ref 8–27)
Bilirubin Total: <0.2 mg/dL (ref 0.0–1.2)
CO2: 26 mmol/L (ref 20–29)
Calcium: 8.9 mg/dL (ref 8.7–10.3)
Chloride: 100 mmol/L (ref 96–106)
Creatinine, Ser: 1.01 mg/dL — ABNORMAL HIGH (ref 0.57–1.00)
GFR calc Af Amer: 62 mL/min/{1.73_m2} (ref >59)
GFR calc non Af Amer: 53 mL/min/{1.73_m2} — ABNORMAL LOW (ref >59)
Globulin, Total: 2.1 g/dL (ref 1.5–4.5)
Glucose: 117 mg/dL — ABNORMAL HIGH (ref 65–99)
Potassium: 4.5 mmol/L (ref 3.5–5.2)
Sodium: 138 mmol/L (ref 134–144)
Total Protein: 5.8 g/dL — ABNORMAL LOW (ref 6.0–8.5)

## 2019-08-26 LAB — PREPARE RBC (CROSSMATCH)

## 2019-08-26 LAB — BASIC METABOLIC PANEL
Anion gap: 9 (ref 5–15)
BUN: 16 mg/dL (ref 8–23)
CO2: 26 mmol/L (ref 22–32)
Calcium: 9 mg/dL (ref 8.9–10.3)
Chloride: 102 mmol/L (ref 98–111)
Creatinine, Ser: 0.92 mg/dL (ref 0.44–1.00)
GFR calc Af Amer: >60 mL/min (ref >60)
GFR calc non Af Amer: 60 mL/min — ABNORMAL LOW (ref >60)
Glucose, Bld: 122 mg/dL — ABNORMAL HIGH (ref 70–99)
Potassium: 4.1 mmol/L (ref 3.5–5.1)
Sodium: 137 mmol/L (ref 135–145)

## 2019-08-26 LAB — PROTIME-INR
INR: 1.3 — ABNORMAL HIGH (ref 0.8–1.2)
Prothrombin Time: 16.1 seconds — ABNORMAL HIGH (ref 11.4–15.2)

## 2019-08-26 MED ORDER — SODIUM CHLORIDE 0.9% FLUSH
3.0000 mL | Freq: Two times a day (BID) | INTRAVENOUS | Status: DC
  Administered 2019-08-26 – 2019-08-27 (×2): 3 mL via INTRAVENOUS

## 2019-08-26 MED ORDER — VITAMIN D 25 MCG (1000 UNIT) PO TABS
2000.0000 [IU] | ORAL_TABLET | Freq: Every day | ORAL | Status: DC
  Administered 2019-08-26 – 2019-08-27 (×2): 2000 [IU] via ORAL
  Filled 2019-08-26 (×4): qty 2

## 2019-08-26 MED ORDER — DIPHENHYDRAMINE-ZINC ACETATE 2-0.1 % EX CREA
TOPICAL_CREAM | Freq: Two times a day (BID) | CUTANEOUS | Status: DC | PRN
  Administered 2019-08-26: 23:00:00 via TOPICAL
  Filled 2019-08-26 (×2): qty 28

## 2019-08-26 MED ORDER — DIPHENHYDRAMINE HCL 25 MG PO CAPS: 25.0000 mg | ORAL_CAPSULE | Freq: Four times a day (QID) | ORAL | Status: DC | PRN

## 2019-08-26 MED ORDER — PANTOPRAZOLE SODIUM 40 MG IV SOLR
80.0000 mg | Freq: Once | INTRAVENOUS | Status: AC
  Administered 2019-08-26: 80 mg via INTRAVENOUS
  Filled 2019-08-26: qty 80

## 2019-08-26 MED ORDER — ONDANSETRON HCL 4 MG/2ML IJ SOLN: 4.0000 mg | Freq: Four times a day (QID) | INTRAMUSCULAR | Status: DC | PRN

## 2019-08-26 MED ORDER — SODIUM CHLORIDE 0.9% FLUSH: 3.0000 mL | INTRAVENOUS | Status: DC | PRN

## 2019-08-26 MED ORDER — BISOPROLOL FUMARATE 5 MG PO TABS
2.5000 mg | ORAL_TABLET | Freq: Every day | ORAL | Status: DC
  Administered 2019-08-26 – 2019-08-27 (×2): 2.5 mg via ORAL
  Filled 2019-08-26: qty 0.5
  Filled 2019-08-26 (×2): qty 1
  Filled 2019-08-26: qty 0.5

## 2019-08-26 MED ORDER — APIXABAN 5 MG PO TABS
5.0000 mg | ORAL_TABLET | Freq: Two times a day (BID) | ORAL | Status: DC
  Administered 2019-08-26 – 2019-08-27 (×2): 5 mg via ORAL
  Filled 2019-08-26 (×2): qty 1

## 2019-08-26 MED ORDER — PANTOPRAZOLE SODIUM 40 MG IV SOLR
40.0000 mg | Freq: Two times a day (BID) | INTRAVENOUS | Status: DC
  Administered 2019-08-26 – 2019-08-27 (×2): 40 mg via INTRAVENOUS
  Filled 2019-08-26 (×2): qty 40

## 2019-08-26 MED ORDER — ACETAMINOPHEN 500 MG PO TABS: 500.0000 mg | ORAL_TABLET | Freq: Three times a day (TID) | ORAL | Status: DC | PRN

## 2019-08-26 MED ORDER — SODIUM CHLORIDE 0.9 % IV SOLN: 250.0000 mL | INTRAVENOUS | Status: DC | PRN

## 2019-08-26 MED ORDER — VITAMIN B-12 1000 MCG PO TABS
1000.0000 ug | ORAL_TABLET | Freq: Every day | ORAL | Status: DC
  Administered 2019-08-26 – 2019-08-27 (×2): 1000 ug via ORAL
  Filled 2019-08-26 (×4): qty 1

## 2019-08-26 MED ORDER — SODIUM CHLORIDE 0.9 % IV SOLN: 10.0000 mL/h | Freq: Once | INTRAVENOUS | Status: DC

## 2019-08-26 MED ORDER — ADULT MULTIVITAMIN W/MINERALS CH
1.0000 | ORAL_TABLET | Freq: Every day | ORAL | Status: DC
  Administered 2019-08-27: 1 via ORAL
  Filled 2019-08-26: qty 1

## 2019-08-26 MED ORDER — METOCLOPRAMIDE HCL 10 MG PO TABS: 5.0000 mg | ORAL_TABLET | Freq: Three times a day (TID) | ORAL | Status: DC

## 2019-08-26 MED ORDER — ALBUTEROL SULFATE (2.5 MG/3ML) 0.083% IN NEBU: 3.0000 mL | INHALATION_SOLUTION | Freq: Four times a day (QID) | RESPIRATORY_TRACT | Status: DC | PRN

## 2019-08-26 MED ORDER — FUROSEMIDE 20 MG PO TABS
20.0000 mg | ORAL_TABLET | Freq: Every day | ORAL | Status: DC
  Administered 2019-08-26 – 2019-08-27 (×2): 20 mg via ORAL
  Filled 2019-08-26 (×2): qty 1

## 2019-08-26 MED ORDER — ONDANSETRON HCL 4 MG PO TABS: 4.0000 mg | ORAL_TABLET | Freq: Four times a day (QID) | ORAL | Status: DC | PRN

## 2019-08-26 MED ORDER — ALPRAZOLAM 0.5 MG PO TABS
0.5000 mg | ORAL_TABLET | Freq: Two times a day (BID) | ORAL | Status: DC | PRN
  Administered 2019-08-26: 0.5 mg via ORAL
  Filled 2019-08-26: qty 1

## 2019-08-26 MED ORDER — ESCITALOPRAM OXALATE 10 MG PO TABS
20.0000 mg | ORAL_TABLET | Freq: Every day | ORAL | Status: DC
  Administered 2019-08-26: 20 mg via ORAL
  Filled 2019-08-26: qty 2

## 2019-08-26 MED ORDER — SODIUM CHLORIDE 0.9% IV SOLUTION
Freq: Once | INTRAVENOUS | Status: AC
  Administered 2019-08-26: 20:00:00 via INTRAVENOUS

## 2019-08-26 NOTE — ED Triage Notes (Signed)
Pt reports that she was called this morning and hemoglobin is 5.3

## 2019-08-26 NOTE — H&P (Addendum)
History and Physical    Misty [name] BMW:[id number] DOB: 1941-07-11 DOA: 08/26/2019  PCP: Sonny Masters, FNP   Patient coming from: Home  Chief Complaint: Low hemoglobin/weakness  HPI: Misty [name] is a 78 y.o. female with medical history significant for recurrent GI bleeds with chronic blood loss anemia, paroxysmal atrial fibrillation on Eliquis, GERD, diverticulosis, celiac disease, anxiety and depression, systolic CHF, ischemic colitis, and pulmonary fibrosis with tobacco abuse who presented to her primary care doctor's office yesterday with complaints of weakness and had hemoglobin levels checked on 10/7.  She was called this morning about hemoglobin being 5.3 and was told to come to the ED for further evaluation.  She has ongoing persistent weakness as well as her usual dark stools.  She denies any abdominal pain, nausea, vomiting, lightheadedness, dizziness, fevers, chills, chest pain, or shortness of breath.  She has had recurrent admissions for PRBC transfusions and follows with Dr. Karilyn Cota as her gastroenterologist.   ED Course: Vital signs are stable and patient is noted to have some mild hypothermia with temperature 95.5 F.  Hemoglobin noted to be 4.8 here.  COVID testing negative.  She has been ordered 2 units of PRBCs with 1 unit transfusing currently.  She is also been given IV Protonix.  EDP had spoken with GI who feels that patient could likely receive PRBCs and have further endoscopic work-up per her gastroenterologist in the outpatient setting if stable.  Review of Systems: All others reviewed and otherwise negative except as noted above.  Past Medical History:  Diagnosis Date  . Anxiety    takes Xanax daily  . Arthritis    back  . Atrial fibrillation with RVR (HCC) 03/25/2019  . Cataract   . Chronic back pain   . Constipation    OTC stool softener prn  . COPD (chronic obstructive pulmonary disease) (HCC)   . Depression   . Diverticulosis   . GERD  (gastroesophageal reflux disease)    takes Ranidine daily  . Hemorrhoids   . History of bronchitis   . History of migraine    many yrs ago  . History of shingles   . Hyperlipidemia   . Insomnia   . Migraines   . Osteoporosis   . PONV (postoperative nausea and vomiting)     Past Surgical History:  Procedure Laterality Date  . ABDOMINAL HYSTERECTOMY    . BIOPSY  07/12/2019   Procedure: BIOPSY;  Surgeon: Malissa Hippo, MD;  Location: [facility];  Service: Endoscopy;;  duodenum  . COLONOSCOPY    . COLONOSCOPY N/A 02/17/2015   Procedure: COLONOSCOPY;  Surgeon: Malissa Hippo, MD;  Location: [facility];  Service: Endoscopy;  Laterality: N/A;  110n - moved to 11:15 - Ann to notify pt  . COLONOSCOPY N/A 07/12/2019   Procedure: COLONOSCOPY;  Surgeon: Malissa Hippo, MD;  Location: [facility];  Service: Endoscopy;  Laterality: N/A;  . ESOPHAGOGASTRODUODENOSCOPY    . ESOPHAGOGASTRODUODENOSCOPY N/A 01/29/2016   Procedure: ESOPHAGOGASTRODUODENOSCOPY (EGD);  Surgeon: Malissa Hippo, MD;  Location: [facility];  Service: Endoscopy;  Laterality: N/A;  . ESOPHAGOGASTRODUODENOSCOPY N/A 10/14/2017   Procedure: ESOPHAGOGASTRODUODENOSCOPY (EGD);  Surgeon: Malissa Hippo, MD;  Location: [facility];  Service: Endoscopy;  Laterality: N/A;  730  . ESOPHAGOGASTRODUODENOSCOPY N/A 07/12/2019   Procedure: ESOPHAGOGASTRODUODENOSCOPY (EGD);  Surgeon: Malissa Hippo, MD;  Location: [facility];  Service: Endoscopy;  Laterality: N/A;  . EYE SURGERY Right 3/15   cataracts  .  GIVENS CAPSULE STUDY N/A 07/29/2019   Procedure: GIVENS CAPSULE STUDY;  Surgeon: Malissa Hippo, MD;  Location: [facility];  Service: Endoscopy;  Laterality: N/A;  . GIVENS CAPSULE STUDY N/A 08/20/2019   Procedure: GIVENS CAPSULE STUDY;  Surgeon: Malissa Hippo, MD;  Location: [facility];  Service: Endoscopy;  Laterality: N/A;  730am  . INTRAMEDULLARY (IM) NAIL INTERTROCHANTERIC Left 01/07/2019   Procedure:  INTRAMEDULLARY (IM) NAIL INTERTROCHANTRIC;  Surgeon: Vickki Hearing, MD;  Location: [facility];  Service: Orthopedics;  Laterality: Left;  . KYPHOPLASTY N/A 07/08/2014   Procedure: Thoracic Eleven Kyphoplasty;  Surgeon: Hewitt Shorts, MD;  Location: [facility];  Service: Neurosurgery;  Laterality: N/A;  . left hip surgery    . LUMBAR LAMINECTOMY/DECOMPRESSION MICRODISCECTOMY Bilateral 05/20/2013   Procedure: LUMBAR LAMINECTOMY/DECOMPRESSION MICRODISCECTOMY 1 LEVEL;  Surgeon: Hewitt Shorts, MD;  Location: [facility];  Service: Neurosurgery;  Laterality: Bilateral;  Bilateral Lumbar four-five laminotomy and left lumbar four-five microdiskectomy  . RIGHT/LEFT HEART CATH AND CORONARY ANGIOGRAPHY N/A 03/31/2019   Procedure: RIGHT/LEFT HEART CATH AND CORONARY ANGIOGRAPHY;  Surgeon: Tonny Bollman, MD;  Location: [facility];  Service: Cardiovascular;  Laterality: N/A;  . SPINE SURGERY     Dr Channing Mutters -  vertebraplasty     reports that she has been smoking cigarettes. She started smoking about 60 years ago. She has a 52.00 pack-year smoking history. She has never used smokeless tobacco. She reports that she does not drink alcohol or use drugs.  Allergies  Allergen Reactions  . Codeine Nausea And Vomiting  . Tramadol Nausea And Vomiting  . Asa [Aspirin] Other (See Comments)    Patient is unaware of allergy  . Azithromycin Other (See Comments)    Patient is unaware of allergy  . Celebrex [Celecoxib] Other (See Comments)    Irritated stomach   . Cymbalta [Duloxetine Hcl] Other (See Comments)    Felt groggy  . Prednisone Rash    She has seen the podiatrist and he had injected cortisone in her feet and she had also taken a peel at home. She had a severe reaction to her face with a rash and had to take Benadryl to resolve the symptom. She actually did get more cortisone shots, but no additional reactions to the shots.  . Vioxx [Rofecoxib] Other (See Comments)    Unknown  . Zelnorm  [Tegaserod] Other (See Comments)    Patient is unaware of allergy  . Zocor [Simvastatin] Other (See Comments)    Patient is unaware of allergy    Family History  Problem Relation Age of Onset  . Hypertension Mother   . Heart attack Mother   . Macular degeneration Father   . Heart disease Father   . Cirrhosis Son   . Heart disease Son   . Colon cancer Neg Hx     Prior to Admission medications   Medication Sig Start Date End Date Taking? Authorizing Provider  acetaminophen (TYLENOL) 500 MG tablet Take 500 mg by mouth every 8 (eight) hours as needed for mild pain, moderate pain or headache.  01/20/19   [provider]  albuterol (PROVENTIL HFA;VENTOLIN HFA) 108 (90 Base) MCG/ACT inhaler Inhale 2 puffs into the lungs every 6 (six) hours as needed for wheezing or shortness of breath (cough). 01/28/19   Sharee Holster, NP  apixaban (ELIQUIS) 5 MG TABS tablet Take 1 tablet (5 mg total) by mouth 2 (two) times daily. Restart on 08/16/19 08/16/19   Tat,  Onalee Hua, MD  bisoprolol (ZEBETA) 5 MG tablet Take 0.5 tablets (2.5 mg total) by mouth daily. 08/04/19   Rollene Rotunda, MD  cholecalciferol (VITAMIN D) 1000 UNITS tablet Take 2,000 Units by mouth daily.     [provider]  escitalopram (LEXAPRO) 20 MG tablet TAKE ONE TABLET AT BEDTIME Patient taking differently: Take 20 mg by mouth at bedtime.  06/24/19   Rehman, Joline Maxcy, MD  furosemide (LASIX) 20 MG tablet Take 1 tablet (20 mg total) by mouth daily. 04/07/19   Rollene Rotunda, MD  metoCLOPramide (REGLAN) 5 MG tablet Take 1 tablet (5 mg total) by mouth 3 (three) times daily before meals. 08/22/19   Malissa Hippo, MD  Multiple Vitamin (MULTIVITAMIN WITH MINERALS) TABS tablet Take 1 tablet by mouth daily. Centrum Silver    [provider]  pantoprazole (PROTONIX) 40 MG tablet Take 1 tablet (40 mg total) by mouth 2 (two) timesdaily before a meal. Patient taking differently: Take 40 mg by mouth 2 (two) times daily.  05/24/19    Rehman, Joline Maxcy, MD  potassium chloride SA (K-DUR) 20 MEQ tablet Take 1 tablet (20 mEq total) by mouth daily for 30 days. 04/19/19 08/18/19  Sonny Masters, FNP  vitamin B-12 (CYANOCOBALAMIN) 1000 MCG tablet Take 1,000 mcg by mouth daily.    [provider]    Physical Exam: Vitals:   08/26/19 1400 08/26/19 1414 08/26/19 1416 08/26/19 1418  BP: (!) 112/51  (!) 112/54 (!) 112/54  Pulse: 75  73 78  Resp: 18  (!) 21 20  Temp:    (!) 95.5 F (35.3 C)  TempSrc:    Oral  SpO2: 92% 100% 95% 95%  Weight:      Height:        Constitutional: NAD, calm, comfortable, pale appearing Vitals:   08/26/19 1400 08/26/19 1414 08/26/19 1416 08/26/19 1418  BP: (!) 112/51  (!) 112/54 (!) 112/54  Pulse: 75  73 78  Resp: 18  (!) 21 20  Temp:    (!) 95.5 F (35.3 C)  TempSrc:    Oral  SpO2: 92% 100% 95% 95%  Weight:      Height:       Eyes: lids and conjunctivae normal ENMT: Mucous membranes are moist.  Neck: normal, supple Respiratory: clear to auscultation bilaterally. Normal respiratory effort. No accessory muscle use.  Currently on 2 L nasal cannula oxygen. Cardiovascular: Regular rate and rhythm, no murmurs. No extremity edema. Abdomen: no tenderness, no distention. Bowel sounds positive.  Musculoskeletal:  No joint deformity upper and lower extremities.   Skin: no rashes, lesions, ulcers.  Psychiatric: Normal judgment and insight. Alert and oriented x 3. Normal mood.   Labs on Admission: I have personally reviewed following labs and imaging studies  CBC: Recent Labs  Lab 08/25/19 1240 08/26/19 1135  WBC 8.7 7.2  NEUTROABS 6.3 5.2  HGB 5.3* 4.8*  HCT 17.7* 17.1*  MCV 83 88.6  PLT 280 260   Basic Metabolic Panel: Recent Labs  Lab 08/25/19 1240 08/26/19 1135  NA 138 137  K 4.5 4.1  CL 100 102  CO2 26 26  GLUCOSE 117* 122*  BUN 19 16  CREATININE 1.01* 0.92  CALCIUM 8.9 9.0   GFR: Estimated Creatinine Clearance: 42.2 mL/min (by C-G formula based on SCr of 0.92  mg/dL). Liver Function Tests: Recent Labs  Lab 08/25/19 1240  AST 11  ALT 8  ALKPHOS 133*  BILITOT <0.2  PROT 5.8*  ALBUMIN 3.7  No results for input(s): LIPASE, AMYLASE in the last 168 hours. No results for input(s): AMMONIA in the last 168 hours. Coagulation Profile: Recent Labs  Lab 08/26/19 1135  INR 1.3*   Cardiac Enzymes: No results for input(s): CKTOTAL, CKMB, CKMBINDEX, TROPONINI in the last 168 hours. BNP (last 3 results) No results for input(s): PROBNP in the last 8760 hours. HbA1C: No results for input(s): HGBA1C in the last 72 hours. CBG: No results for input(s): GLUCAP in the last 168 hours. Lipid Profile: No results for input(s): CHOL, HDL, LDLCALC, TRIG, CHOLHDL, LDLDIRECT in the last 72 hours. Thyroid Function Tests: No results for input(s): TSH, T4TOTAL, FREET4, T3FREE, THYROIDAB in the last 72 hours. Anemia Panel: No results for input(s): VITAMINB12, FOLATE, FERRITIN, TIBC, IRON, RETICCTPCT in the last 72 hours. Urine analysis:    Component Value Date/Time   COLORURINE YELLOW 01/14/2019 0226   APPEARANCEUR CLEAR 01/14/2019 0226   LABSPEC 1.012 01/14/2019 0226   PHURINE 7.0 01/14/2019 0226   GLUCOSEU NEGATIVE 01/14/2019 0226   HGBUR NEGATIVE 01/14/2019 0226   BILIRUBINUR NEGATIVE 01/14/2019 0226   KETONESUR NEGATIVE 01/14/2019 0226   PROTEINUR NEGATIVE 01/14/2019 0226   NITRITE NEGATIVE 01/14/2019 0226   LEUKOCYTESUR NEGATIVE 01/14/2019 0226    Radiological Exams on Admission: No results found.  EKG: Independently reviewed.  Sinus rhythm at 76 bpm.  Assessment/Plan Active Problems:   Acute on chronic blood loss anemia    Symptomatic acute on chronic blood loss anemia -This is related to persistent GI bleed which has undergone frequent, extensive evaluation -GI aware and appreciate any recommendations -Plan to transfuse 3 units PRBCs and recheck CBC in a.m. -Monitor on telemetry -Plan to start diet if no procedure planned -PPI IV  twice daily -Continue Eliquis per GI recommendation  Paroxysmal atrial fibrillation -Monitor on telemetry -Continue bisoprolol -Continue Eliquis  Chronic systolic CHF -LVEF 30-35% with diffuse hypokinesis on 2D echocardiogram 03/26/2019 -Continue bisoprolol -Currently appears euvolemic -Continue on home Lasix  GERD -PPI IV twice daily  Tobacco abuse/pulmonary fibrosis -Continues to smoke and declines nicotine patch -Discussed tobacco cessation -No acute bronchospasms currently noted -Wean oxygen  Anxiety and depression -Can you Lexapro   DVT prophylaxis: Eliquis per GI recommendations Code Status: Full Family Communication: Husband at bedside Disposition Plan: Admit for PRBC transfusion Consults called: GI Admission status: Observation, telemetry   Anasia Agro Hoover Brunette DO [facility] Pager [phone]  If 7PM-7AM, please contact night-coverage [email or web address] Password [email or web address]  08/26/2019, 2:43 PM

## 2019-08-26 NOTE — Consult Note (Addendum)
Referring Provider: Margette Fast, MD Primary Care Physician:  Baruch Gouty, FNP Primary Gastroenterologist:  Hildred Laser, MD  Reason for Consultation:  Profound anemia  HPI: Misty [name] is a 78 y.o. female with past medical history significant for celiac disease, chronic GERD, paroxysmal atrial fibrillation on Eliquis, transfusion dependent anemia/IDA with history of passing dark stools/heme positive stools.  Three admissions since August 25 for transfusion dependent anemia/occult GI bleeding.  Presented with profound anemia with hemoglobin of 5.3.  2 weeks ago her hemoglobin was 8.  Her BUN was 19, creatinine 1.01 yesterday.  Today in the ED her hemoglobin was 4.8.  Platelets normal.  INR 1.3.  She states she has intermittent dark to black stools. Happening since put on Eliquis. Not all the time. No brbpr. Some mild lower abdominal pain from time to time, depending on what she eats. Worse with acidic food.  Heartburn all the time on pantoprazole 58m BID and uses OTC antacids as well. Has been on pantoprazole for years. No dysphagia. Tries to avoid acidic foods. Nothing seems to help her reflux. No n/v.      Patient has a history of GI bleed requiring frequent blood transfusions in the setting of anticoagulation for A. fib.  Work up as outlined below.  EGD August 24 revealed small sliding hiatal hernia, portal gastropathy, minimal scalloping to the duodenal mucosa but biopsy revealed resolution of changes.  Colonoscopy August 24 revealed left-sided diverticulosis, internal/external hemorrhoids.  Small bowel capsule endoscopy September 10, capsule never left the stomach.  CTA September 11 did not reveal adenopathy, small bowel unlikely.  Likely high-grade stenosis of the SMA, celiac artery origin with less than 50% stenosis, IMA patent.  GI bleeding scan September 24 was negative.  Small bowel Givens capsule study dated 08-20-19 Findings:  Patient was able to swallow given  capsule without any difficulty. Study duration 8 hours 3 minutes and 55 seconds. Study is incomplete as the capsule never left the stomach. Multiple erosions noted at distal esophagus with some food debris. Esophageal transit time 2 hours 3 minutes and 15 seconds. Food debris noted in stomach along with coffee ground material seen on multiple images including images at 01/21/2004, 03:08:33, 06:55:32 and 07:24:05. Prominent gastric folds with telangiectasia and speck of blood noted on images 03:37:50 and 07:33:48.   Prior to Admission medications   Medication Sig Start Date End Date Taking? Authorizing Provider  acetaminophen (TYLENOL) 500 MG tablet Take 500 mg by mouth every 8 (eight) hours as needed for mild pain, moderate pain or headache.  01/20/19   [provider]  albuterol (PROVENTIL HFA;VENTOLIN HFA) 108 (90 Base) MCG/ACT inhaler Inhale 2 puffs into the lungs every 6 (six) hours as needed for wheezing or shortness of breath (cough). 01/28/19   GGerlene Fee NP  apixaban (ELIQUIS) 5 MG TABS tablet Take 1 tablet (5 mg total) by mouth 2 (two) times daily. Restart on 08/16/19 08/16/19   TOrson Eva MD  bisoprolol (ZEBETA) 5 MG tablet Take 0.5 tablets (2.5 mg total) by mouth daily. 08/04/19   HMinus Breeding MD  cholecalciferol (VITAMIN D) 1000 UNITS tablet Take 2,000 Units by mouth daily.     [provider]  escitalopram (LEXAPRO) 20 MG tablet TAKE ONE TABLET AT BEDTIME Patient taking differently: Take 20 mg by mouth at bedtime.  06/24/19   Rehman, NMechele Dawley MD  furosemide (LASIX) 20 MG tablet Take 1 tablet (20 mg total) by mouth daily. 04/07/19   HMinus Breeding MD  metoCLOPramide (REGLAN) 5 MG tablet Take 1 tablet (5 mg total) by mouth 3 (three) times daily before meals. 08/22/19   Rogene Houston, MD  Multiple Vitamin (MULTIVITAMIN WITH MINERALS) TABS tablet Take 1 tablet by mouth daily. Centrum Silver    [provider]  pantoprazole (PROTONIX) 40 MG tablet Take 1  tablet (40 mg total) by mouth 2 (two) timesdaily before a meal. Patient taking differently: Take 40 mg by mouth 2 (two) times daily.  05/24/19   Rehman, Mechele Dawley, MD  potassium chloride SA (K-DUR) 20 MEQ tablet Take 1 tablet (20 mEq total) by mouth daily for 30 days. 04/19/19 08/18/19  Baruch Gouty, FNP  vitamin B-12 (CYANOCOBALAMIN) 1000 MCG tablet Take 1,000 mcg by mouth daily.    [provider]    Current Facility-Administered Medications  Medication Dose Route Frequency Provider Last Rate Last Dose  . 0.9 %  sodium chloride infusion  10 mL/hr Intravenous Once Long, Wonda Olds, MD       Current Outpatient Medications  Medication Sig Dispense Refill  . acetaminophen (TYLENOL) 500 MG tablet Take 500 mg by mouth every 8 (eight) hours as needed for mild pain, moderate pain or headache.     . albuterol (PROVENTIL HFA;VENTOLIN HFA) 108 (90 Base) MCG/ACT inhaler Inhale 2 puffs into the lungs every 6 (six) hours as needed for wheezing or shortness of breath (cough). 1 Inhaler 0  . apixaban (ELIQUIS) 5 MG TABS tablet Take 1 tablet (5 mg total) by mouth 2 (two) times daily. Restart on 08/16/19 60 tablet   . bisoprolol (ZEBETA) 5 MG tablet Take 0.5 tablets (2.5 mg total) by mouth daily. 30 tablet 6  . cholecalciferol (VITAMIN D) 1000 UNITS tablet Take 2,000 Units by mouth daily.     Marland Kitchen escitalopram (LEXAPRO) 20 MG tablet TAKE ONE TABLET AT BEDTIME (Patient taking differently: Take 20 mg by mouth at bedtime. ) 30 tablet 3  . furosemide (LASIX) 20 MG tablet Take 1 tablet (20 mg total) by mouth daily. 90 tablet 3  . metoCLOPramide (REGLAN) 5 MG tablet Take 1 tablet (5 mg total) by mouth 3 (three) times daily before meals. 90 tablet 2  . Multiple Vitamin (MULTIVITAMIN WITH MINERALS) TABS tablet Take 1 tablet by mouth daily. Centrum Silver    . pantoprazole (PROTONIX) 40 MG tablet Take 1 tablet (40 mg total) by mouth 2 (two) timesdaily before a meal. (Patient taking differently: Take 40 mg by mouth 2  (two) times daily. ) 60 tablet 5  . potassium chloride SA (K-DUR) 20 MEQ tablet Take 1 tablet (20 mEq total) by mouth daily for 30 days. 30 tablet 1  . vitamin B-12 (CYANOCOBALAMIN) 1000 MCG tablet Take 1,000 mcg by mouth daily.      Allergies as of 08/26/2019 - Review Complete 08/26/2019  Allergen Reaction Noted  . Codeine Nausea And Vomiting 04/08/2013  . Tramadol Nausea And Vomiting 10/07/2017  . Asa [aspirin] Other (See Comments) 04/08/2013  . Azithromycin Other (See Comments) 04/08/2013  . Celebrex [celecoxib] Other (See Comments) 04/08/2013  . Cymbalta [duloxetine hcl] Other (See Comments) 04/08/2013  . Prednisone Rash 03/25/2013  . Vioxx [rofecoxib] Other (See Comments) 04/08/2013  . Zelnorm [tegaserod] Other (See Comments) 04/08/2013  . Zocor [simvastatin] Other (See Comments) 04/08/2013    Past Medical History:  Diagnosis Date  . Anxiety    takes Xanax daily  . Arthritis    back  . Atrial fibrillation with RVR ([place]) 03/25/2019  . Cataract   .  Chronic back pain   . Constipation    OTC stool softener prn  . COPD (chronic obstructive pulmonary disease) ([place])   . Depression   . Diverticulosis   . GERD (gastroesophageal reflux disease)    takes Ranidine daily  . Hemorrhoids   . History of bronchitis   . History of migraine    many yrs ago  . History of shingles   . Hyperlipidemia   . Insomnia   . Migraines   . Osteoporosis   . PONV (postoperative nausea and vomiting)     Past Surgical History:  Procedure Laterality Date  . ABDOMINAL HYSTERECTOMY    . BIOPSY  07/12/2019   Procedure: BIOPSY;  Surgeon: Rogene Houston, MD;  [facility];  Service: Endoscopy;;  duodenum  . COLONOSCOPY    . COLONOSCOPY N/A 02/17/2015   Procedure: COLONOSCOPY;  Surgeon: Rogene Houston, MD;  [facility];  Service: Endoscopy;  Laterality: N/A;  110n - moved to 11:15 - Ann to notify pt  . COLONOSCOPY N/A 07/12/2019   Procedure: COLONOSCOPY;  Surgeon: Rogene Houston, MD;  [facility];  Service: Endoscopy;  Laterality: N/A;  . ESOPHAGOGASTRODUODENOSCOPY    . ESOPHAGOGASTRODUODENOSCOPY N/A 01/29/2016   Procedure: ESOPHAGOGASTRODUODENOSCOPY (EGD);  Surgeon: Rogene Houston, MD;  [facility];  Service: Endoscopy;  Laterality: N/A;  . ESOPHAGOGASTRODUODENOSCOPY N/A 10/14/2017   Procedure: ESOPHAGOGASTRODUODENOSCOPY (EGD);  Surgeon: Rogene Houston, MD;  [facility];  Service: Endoscopy;  Laterality: N/A;  730  . ESOPHAGOGASTRODUODENOSCOPY N/A 07/12/2019   Procedure: ESOPHAGOGASTRODUODENOSCOPY (EGD);  Surgeon: Rogene Houston, MD;  [facility];  Service: Endoscopy;  Laterality: N/A;  . EYE SURGERY Right 3/15   cataracts  . GIVENS CAPSULE STUDY N/A 07/29/2019   Procedure: GIVENS CAPSULE STUDY;  Surgeon: Rogene Houston, MD;  [facility];  Service: Endoscopy;  Laterality: N/A;  . GIVENS CAPSULE STUDY N/A 08/20/2019   Procedure: GIVENS CAPSULE STUDY;  Surgeon: Rogene Houston, MD;  [facility];  Service: Endoscopy;  Laterality: N/A;  730am  . INTRAMEDULLARY (IM) NAIL INTERTROCHANTERIC Left 01/07/2019   Procedure: INTRAMEDULLARY (IM) NAIL INTERTROCHANTRIC;  Surgeon: Carole Civil, MD;  Location: [facility];  Service: Orthopedics;  Laterality: Left;  . KYPHOPLASTY N/A 07/08/2014   Procedure: Thoracic Eleven Kyphoplasty;  Surgeon: Hosie Spangle, MD;  Location: [place] [facility];  Service: Neurosurgery;  Laterality: N/A;  . left hip surgery    . LUMBAR LAMINECTOMY/DECOMPRESSION MICRODISCECTOMY Bilateral 05/20/2013   Procedure: LUMBAR LAMINECTOMY/DECOMPRESSION MICRODISCECTOMY 1 LEVEL;  Surgeon: Hosie Spangle, MD;  Location: [place] [facility];  Service: Neurosurgery;  Laterality: Bilateral;  Bilateral Lumbar four-five laminotomy and left lumbar four-five microdiskectomy  . RIGHT/LEFT HEART CATH AND CORONARY ANGIOGRAPHY N/A 03/31/2019   Procedure: RIGHT/LEFT HEART CATH AND CORONARY ANGIOGRAPHY;   Surgeon: Sherren Mocha, MD;  Location: [place] [facility];  Service: Cardiovascular;  Laterality: N/A;  . SPINE SURGERY     Dr Carloyn Manner -  vertebraplasty    Family History  Problem Relation Age of Onset  . Hypertension Mother   . Heart attack Mother   . Macular degeneration Father   . Heart disease Father   . Cirrhosis Son   . Heart disease Son   . Colon cancer Neg Hx     Social History   Socioeconomic History  . Marital status: Married    Spouse name: Sterling Big   . Number of children: Not  on file  . Years of education: Not on file  . Highest education level: Not on file  Occupational History  . Occupation: retired     Comment: [place] work - came out in [employer]  . Financial resource strain: Not hard at all  . Food insecurity    Worry: Never true    Inability: Never true  . Transportation needs    Medical: No    Non-medical: No  Tobacco Use  . Smoking status: Current Some Day Smoker    Packs/day: 1.00    Years: 52.00    Pack years: 52.00    Types: Cigarettes    Start date: 11/18/1958    Last attempt to quit: 08/20/2011    Years since quitting: 8.0  . Smokeless tobacco: Never Used  . Tobacco comment: 1/2-1pack off and on all her life  Substance and Sexual Activity  . Alcohol use: No    Alcohol/week: 0.0 standard drinks  . Drug use: No  . Sexual activity: Not Currently  Lifestyle  . Physical activity    Days per week: Patient refused    Minutes per session: Patient refused  . Stress: Only a little  Relationships  . Social connections    Talks on phone: More than three times a week    Gets together: Once a week    Attends religious service: Patient refused    Active member of club or organization: Patient refused    Attends meetings of clubs or organizations: Patient refused    Relationship status: Married  . Intimate partner violence    Fear of current or ex partner: No    Emotionally abused: No    Physically abused: No    Forced sexual activity: No   Other Topics Concern  . Not on file  Social History Narrative   Lives at home with husband, Sterling Big. Had one son- now deceased      ROS:  General: Negative for anorexia, weight loss, fever, chills, fatigue, +++weakness. Eyes: Negative for vision changes.  ENT: Negative for hoarseness, difficulty swallowing , nasal congestion. CV: Negative for chest pain, angina, palpitations, dyspnea on exertion, peripheral edema.  Respiratory: Negative for dyspnea at rest, dyspnea on exertion, cough, sputum, wheezing.  GI: See history of present illness. GU:  Negative for dysuria, hematuria, urinary incontinence, urinary frequency, nocturnal urination.  MS: Negative for joint pain, low back pain.  Derm: Negative for rash or itching.  Neuro: Negative for weakness, abnormal sensation, seizure, frequent headaches, memory loss, confusion.  Psych: Negative for anxiety, depression, suicidal ideation, hallucinations.  Endo: Negative for unusual weight change.  Heme: Negative for bruising or bleeding. Allergy: Negative for rash or hives.       Physical Examination: Vital signs in last 24 hours: Temp:  [98.1 F (36.7 C)] 98.1 F (36.7 C) (10/08 1119) Pulse Rate:  [79] 79 (10/08 1119) Resp:  [16] 16 (10/08 1119) BP: (102)/(43) 102/43 (10/08 1119) SpO2:  [99 %] 99 % (10/08 1119) Weight:  [53 kg] 53 kg (10/08 1120)    General: Well-nourished, well-developed in no acute distress.  Head: Normocephalic, atraumatic.   Eyes: Conjunctiva pink, no icterus. Mouth: Oropharyngeal mucosa moist and pink , no lesions erythema or exudate. Neck: Supple without thyromegaly, masses, or lymphadenopathy.  Lungs: Clear to auscultation bilaterally.  Heart: Regular rate and rhythm, no murmurs rubs or gallops.  Abdomen: Bowel sounds are normal, nontender, nondistended, no hepatosplenomegaly or masses, no abdominal bruits or    hernia ,  no rebound or guarding.   Rectal: not performed Extremities: No lower extremity edema,  clubbing, deformity.  Neuro: Alert and oriented x 4 , grossly normal neurologically.  Skin: Warm and dry, no rash or jaundice.   Psych: Alert and cooperative, normal mood and affect.        Intake/Output from previous day: No intake/output data recorded. Intake/Output this shift: No intake/output data recorded.  Lab Results: CBC Recent Labs    08/25/19 1240 08/26/19 1135  WBC 8.7 7.2  HGB 5.3* 4.8*  HCT 17.7* 17.1*  MCV 83 88.6  PLT 280 260   BMET Recent Labs    08/25/19 1240 08/26/19 1135  NA 138 137  K 4.5 4.1  CL 100 102  CO2 26 26  GLUCOSE 117* 122*  BUN 19 16  CREATININE 1.01* 0.92  CALCIUM 8.9 9.0   LFT Recent Labs    08/25/19 1240  BILITOT <0.2  ALKPHOS 133*  AST 11  ALT 8  PROT 5.8*  ALBUMIN 3.7    Lipase No results for input(s): LIPASE in the last 72 hours.  PT/INR No results for input(s): LABPROT, INR in the last 72 hours.    Imaging Studies: Nm Gi Blood Loss  Result Date: 08/12/2019 CLINICAL DATA:  Chronic anemia question GI blood loss EXAM: NUCLEAR MEDICINE GASTROINTESTINAL BLEEDING SCAN TECHNIQUE: Sequential abdominal images were obtained following intravenous administration of Tc-27mlabeled red blood cells. RADIOPHARMACEUTICALS:  125.1 mCi Tc-938mertechnetate in-vitro labeled red cells. COMPARISON:  None FINDINGS: Intermittent artifacts from the patient's RIGHT arm. Normal blood pool distribution of labeled red cells. No abnormal gastrointestinal localization of tracer identified through 2 hours of imaging to suggest active GI bleeding. Small amount of de labeled tracer within urinary bladder. IMPRESSION: Negative GI bleeding scan. Electronically Signed   By: MaLavonia Dana.D.   On: 08/12/2019 10:49   Ct Angio Abd/pel W/ And/or W/o  Result Date: 07/30/2019 CLINICAL DATA:  [age]ear old female with history of GI bleed EXAM: CTA ABDOMEN AND PELVIS wITHOUT AND WITH CONTRAST TECHNIQUE: Multidetector CT imaging of the abdomen and pelvis was  performed using the standard protocol during bolus administration of intravenous contrast. Multiplanar reconstructed images and MIPs were obtained and reviewed to evaluate the vascular anatomy. CONTRAST:  10028mMNIPAQUE IOHEXOL 350 MG/ML SOLN COMPARISON:  July 19, 2016 FINDINGS: VASCULAR Aorta: Atherosclerotic changes of the lower thoracic and the abdominal aorta. Greatest diameter of the thoracic aorta at the hiatus measures 3.2 cm. Greatest diameter of the abdominal aorta is suprarenal, measuring 3.1 cm at the level of the SMA origin. No dissection flap.  No periaortic fluid. Celiac: Atherosclerotic changes at the celiac artery origin with less than 50% stenosis. Branches remain patent. SMA: Dense atherosclerotic calcifications at the SMA origin with likely high-grade stenosis secondary to calcified plaque. Renals: On the right there are 2 renal arteries originating at the 10 o'clock position cranially and the 11 o'clock position caudally. Both of these demonstrate significant atherosclerotic changes with estimated 50% narrowing. On the left, the main renal artery originates at the 3 o'clock position with atherosclerotic changes at the origin. There is a small accessory renal artery to the lower pole originating at the 2 o'clock position which remains patent. IMA: IMA is patent. Right lower extremity: Moderate atherosclerotic changes of the right iliac system. Diameter of the common iliac arteries 13 mm. Calcifications of the hypogastric artery which remains patent. External iliac artery is patent. Common femoral artery patent. Proximal profunda femoris and SFA patent. Left lower  extremity: Moderate atherosclerotic changes of the left iliac system. Diameter of the common iliac artery measures 11 mm. Hypogastric artery remains patent. External iliac artery is patent. Common femoral artery patent. Proximal profunda femoris and SFA are patent. Veins: Unremarkable appearance of the venous system. Review of the  MIP images confirms the above findings. NON-VASCULAR Lower chest: No acute. Hepatobiliary: Redemonstration of small low-density cysts of left and right liver, similar distribution and size to the comparison CT of 2017. Gallbladder is decompressed, with no calcified cholelithiasis. Pancreas: Unremarkable Spleen: Unremarkable Adrenals/Urinary Tract: Unremarkable appearance of adrenal glands. Right: Perfusion symmetric to the left kidney. No hydronephrosis or nephrolithiasis. Unremarkable course of the right ureter. Left: Symmetric perfusion to the right. No hydronephrosis. No nephrolithiasis. Unremarkable course of the left ureter. Unremarkable appearance of the urinary bladder . Stomach/Bowel: No accumulation of contrast that with identify or confirm a source of gastrointestinal hemorrhage. The endoscopy capsule is present within the stomach lumen. Otherwise unremarkable stomach. Unremarkable appearance of small bowel. No evidence of obstruction. Normal appendix. large fecal stool burden throughout the colon with no focal wall thickening or significant inflammatory changes. Diverticular change present. Lymphatic: No lymphadenopathy. Mesenteric: There is trace fluid within the anatomic pelvis without focal inflammatory changes within the abdomen that may suggest a etiology. Reproductive: Hysterectomy Other: No hernia. Musculoskeletal: Surgical changes of left hip repair with incomplete remodeling/healing at the fracture site. Osteopenia. Multiple levels of vertebral augmentation, similar to prior. No new fracture identified. IMPRESSION: No acute abnormality, with no evidence of acute gastrointestinal hemorrhage. Large fecal stool burden within the colon. Aortic atherosclerosis with associated mesenteric and bilateral renal arterial disease. All 3 mesenteric arteries remain patent, with high-grade stenosis at the origin of the SMA. Bilateral moderate iliac arterial disease with no high-grade stenosis or occlusion.  Additional ancillary findings as above. Signed, Dulcy Fanny. Dellia Nims, RPVI [facility] Electronically Signed   By: Corrie Mckusick D.O.   On: 07/30/2019 11:21  [4 week]   Impression: Pleasant 78 year old female with history of celiac disease, chronic refractory GERD, paroxysmal atrial fibrillation on Eliquis, transfusion dependent anemia/IDA with history of dark stools/heme positive stools.  This is her fourth admission since August 25 for transfusion dependent anemia with occult GI bleeding.  She has a history of erosive reflux esophagitis, portal gastropathy on multiple EGDs.  Based on small bowel biopsy celiac disease appears to be in remission.  Recent colonoscopy unremarkable.  Unfortunately 2 attempts at capsule endoscopy have been incomplete, capsule never left the stomach.  Previous GI bleeding scan negative.  Patient presents today due to hemoglobin of 5.  Denies any recent black stools, intermittent dark stools.  Suspect upper GI findings are contributing to occult bleeding in the setting of Eliquis however we have not excluded small bowel etiology such as AVMs.  Dr. Laural Golden had plans of upper endoscopy with small bowel capsule placement if hemoglobin dropped again.  Plan: 1. Transfusions as planned. 2. Continue pantoprazole 40 mg twice daily.  She may benefit from changing PPI therapy given refractory disease and endoscopic findings.  She will discuss with Dr. Laural Golden. 3. She may be developing some mild tardive dyskinesia, difficult to tell today.  She reports only one dose of Reglan last week. She believes her symptoms are related to extremely dry mouth today.  Would hold off on Reglan for now to see if symptoms do improve with hydration. 4. Discussed with Dr. Gala Romney,  consider continuing Eliquis for now and make arrangements for  EGD with capsule placement mid next week with Dr. Laural Golden.  Patient does not want procedure during this  admission, she wants to receive blood transfusions and go home.  Given the stuttering nature of her bleeding she should be okay with this plan.  We would like to thank you for the opportunity to participate in the care of Misty [name].  Laureen Ochs. Bernarda Caffey [facility] [phone] 10/8/20202:57 PM      LOS: 0 days

## 2019-08-26 NOTE — ED Provider Notes (Signed)
Emergency Department Provider Note   I have reviewed the triage vital signs and the nursing notes.   HISTORY  Chief Complaint Abnormal Lab   HPI Misty [name] is a 78 y.o. female past medical history of A. fib on Eliquis, and GI bleeding with chronic blood loss anemia requiring frequent blood transfusion presents to the emergency department with generalized weakness and outpatient hemoglobin of 5.3.  Labs obtained yesterday and patient called today when results came back instructing her to go to the emergency department.  She has undergone pill endoscopy but study was inconclusive.  She is followed by Dr. Laural Golden.  She tells me that there was some discussion about changing her blood thinner but that she continues to take Eliquis, including this AM.  She notes intermittent dark/black bowel movements but nothing consistent.  No sudden increase in black bowel movements. No vomiting blood or SOB.   Past Medical History:  Diagnosis Date  . Anxiety    takes Xanax daily  . Arthritis    back  . Atrial fibrillation with RVR ([place]) 03/25/2019  . Cataract   . Chronic back pain   . Constipation    OTC stool softener prn  . COPD (chronic obstructive pulmonary disease) ([place])   . Depression   . Diverticulosis   . GERD (gastroesophageal reflux disease)    takes Ranidine daily  . Hemorrhoids   . History of bronchitis   . History of migraine    many yrs ago  . History of shingles   . Hyperlipidemia   . Insomnia   . Migraines   . Osteoporosis   . PONV (postoperative nausea and vomiting)     Patient Active Problem List   Diagnosis Date Noted  . Acute on chronic blood loss anemia 08/26/2019  . Cardiomyopathy ([place]) 08/17/2019  . Nonrheumatic mitral valve regurgitation 08/17/2019  . Gastrointestinal hemorrhage 08/16/2019  . Chronic systolic CHF (congestive heart failure) ([place]) 08/11/2019  . Acute anemia 08/10/2019  . GI bleed 07/28/2019  . Gastroesophageal reflux disease with  esophagitis 07/20/2019  . Paroxysmal atrial fibrillation ([place]) 07/20/2019  . Anemia 07/10/2019  . Aortic atherosclerosis ([place]) 07/09/2019  . Vitamin D deficiency 07/09/2019  . GAD (generalized anxiety disorder) 07/09/2019  . Depression, recurrent ([place]) 07/09/2019  . Controlled substance agreement signed 07/09/2019  . Educated about COVID-19 virus infection 04/07/2019  . S/P thoracentesis   . Pleural effusion, right   . Acute systolic heart failure ([place]) 03/31/2019  . Lobar pneumonia ([place])   . Acute hypoxemic respiratory failure ([place])   . Community acquired pneumonia 03/26/2019  . Elevated troponin 03/25/2019  . S/P IM nail left hip 01/07/2019 01/26/2019  . H/O kyphoplasty 01/14/2019  . Iron deficiency anemia 01/14/2019  . Acute blood loss anemia 01/13/2019  . Constipation due to opioid therapy 01/13/2019  . Anxiety and depression 01/13/2019  . Hypokalemia 01/10/2019  . Displaced intertrochanteric fracture of left femur, sequela 01/06/2019  . Chronic midline low back pain without sciatica 10/09/2018  . Chronic midline thoracic back pain 10/09/2018  . Osteopenia determined by x-ray 04/16/2018  . Pulmonary fibrosis ([place]) 04/07/2018  . Non-intractable vomiting with nausea 10/06/2017  . Malnutrition of moderate degree 01/29/2016  . Symptomatic anemia 01/27/2016  . Generalized weakness 01/27/2016  . Abnormal chest x-ray 01/27/2016  . Tobacco use disorder 01/27/2016  . Colitis   . Hematochezia 02/26/2015  . Ischemic colitis ([place]) 02/26/2015  . Orthostatic hypotension 02/26/2015  . Nontraumatic compression fracture of T11 vertebra ([facility]) 07/08/2014  .  Hyperlipidemia 08/19/2013  . Fibrocystic breast disease 08/19/2013  . History of migraine headaches 08/19/2013  . GERD (gastroesophageal reflux disease) 04/07/2013  . Osteoporosis, postmenopausal 04/07/2013    Past Surgical History:  Procedure Laterality Date  . ABDOMINAL HYSTERECTOMY    . BIOPSY  07/12/2019   Procedure: BIOPSY;   Surgeon: Rogene Houston, MD;  [facility];  Service: Endoscopy;;  duodenum  . COLONOSCOPY    . COLONOSCOPY N/A 02/17/2015   Procedure: COLONOSCOPY;  Surgeon: Rogene Houston, MD;  [facility];  Service: Endoscopy;  Laterality: N/A;  110n - moved to 11:15 - Ann to notify pt  . COLONOSCOPY N/A 07/12/2019   Procedure: COLONOSCOPY;  Surgeon: Rogene Houston, MD;  [facility];  Service: Endoscopy;  Laterality: N/A;  . ESOPHAGOGASTRODUODENOSCOPY    . ESOPHAGOGASTRODUODENOSCOPY N/A 01/29/2016   Procedure: ESOPHAGOGASTRODUODENOSCOPY (EGD);  Surgeon: Rogene Houston, MD;  [facility];  Service: Endoscopy;  Laterality: N/A;  . ESOPHAGOGASTRODUODENOSCOPY N/A 10/14/2017   Procedure: ESOPHAGOGASTRODUODENOSCOPY (EGD);  Surgeon: Rogene Houston, MD;  [facility];  Service: Endoscopy;  Laterality: N/A;  730  . ESOPHAGOGASTRODUODENOSCOPY N/A 07/12/2019   Procedure: ESOPHAGOGASTRODUODENOSCOPY (EGD);  Surgeon: Rogene Houston, MD;  [facility];  Service: Endoscopy;  Laterality: N/A;  . EYE SURGERY Right 3/15   cataracts  . GIVENS CAPSULE STUDY N/A 07/29/2019   Procedure: GIVENS CAPSULE STUDY;  Surgeon: Rogene Houston, MD;  [facility];  Service: Endoscopy;  Laterality: N/A;  . GIVENS CAPSULE STUDY N/A 08/20/2019   Procedure: GIVENS CAPSULE STUDY;  Surgeon: Rogene Houston, MD;  [facility];  Service: Endoscopy;  Laterality: N/A;  730am  . INTRAMEDULLARY (IM) NAIL INTERTROCHANTERIC Left 01/07/2019   Procedure: INTRAMEDULLARY (IM) NAIL INTERTROCHANTRIC;  Surgeon: Carole Civil, MD;  Location: [facility];  Service: Orthopedics;  Laterality: Left;  . KYPHOPLASTY N/A 07/08/2014   Procedure: Thoracic Eleven Kyphoplasty;  Surgeon: Hosie Spangle, MD;  Location: [facility];  Service: Neurosurgery;  Laterality: N/A;  . left hip surgery    . LUMBAR LAMINECTOMY/DECOMPRESSION MICRODISCECTOMY Bilateral 05/20/2013    Procedure: LUMBAR LAMINECTOMY/DECOMPRESSION MICRODISCECTOMY 1 LEVEL;  Surgeon: Hosie Spangle, MD;  Location: [place];  Service: Neurosurgery;  Laterality: Bilateral;  Bilateral Lumbar four-five laminotomy and left lumbar four-five microdiskectomy  . RIGHT/LEFT HEART CATH AND CORONARY ANGIOGRAPHY N/A 03/31/2019   Procedure: RIGHT/LEFT HEART CATH AND CORONARY ANGIOGRAPHY;  Surgeon: Sherren Mocha, MD;  Location: [facility];  Service: Cardiovascular;  Laterality: N/A;  . SPINE SURGERY     Dr Carloyn Manner -  vertebraplasty    Allergies Codeine, Tramadol, Asa [aspirin], Azithromycin, Celebrex [celecoxib], Cymbalta [duloxetine hcl], Prednisone, Vioxx [rofecoxib], Zelnorm [tegaserod], and Zocor [simvastatin]  Family History  Problem Relation Age of Onset  . Hypertension Mother   . Heart attack Mother   . Macular degeneration Father   . Heart disease Father   . Cirrhosis Son   . Heart disease Son   . Colon cancer Neg Hx     Social History Social History   Tobacco Use  . Smoking status: Current Some Day Smoker    Packs/day: 1.00    Years: 52.00    Pack years: 52.00    Types: Cigarettes    Start date: 11/18/1958    Last attempt to quit: 08/20/2011    Years since quitting: 8.0  . Smokeless tobacco: Never Used  . Tobacco comment: 1/2-1pack off and  on all her life  Substance Use Topics  . Alcohol use: No    Alcohol/week: 0.0 standard drinks  . Drug use: No    Review of Systems  Constitutional: No fever/chills. Positive generalized weakness.  Eyes: No visual changes. ENT: No sore throat. Cardiovascular: Denies chest pain. Respiratory: Denies shortness of breath. Gastrointestinal: No abdominal pain.  No nausea, no vomiting.  No diarrhea.  No constipation. Genitourinary: Negative for dysuria. Musculoskeletal: Negative for back pain. Skin: Negative for rash. Neurological: Negative for headaches, focal weakness or numbness.  10-point ROS otherwise negative.   ____________________________________________   PHYSICAL EXAM:  VITAL SIGNS: ED Triage Vitals  Enc Vitals Group     BP 08/26/19 1119 (!) 102/43     Pulse Rate 08/26/19 1119 79     Resp 08/26/19 1119 16     Temp 08/26/19 1119 98.1 F (36.7 C)     Temp Source 08/26/19 1119 Oral     SpO2 08/26/19 1119 99 %     Weight 08/26/19 1120 116 lb 13.5 oz (53 kg)     Height 08/26/19 1120 5' 8"  (1.727 m)   Constitutional: Alert and oriented. Well appearing and in no acute distress. Patient appears very pale.  Eyes: Conjunctivae are pale.  Head: Atraumatic. Nose: No congestion/rhinnorhea. Mouth/Throat: Mucous membranes are moist.  Neck: No stridor.  Cardiovascular: Normal rate, regular rhythm. Good peripheral circulation. Grossly normal heart sounds.   Respiratory: Normal respiratory effort.  No retractions. Lungs CTAB. Gastrointestinal: Soft and nontender. No distention.  Musculoskeletal: No gross deformities of extremities. Neurologic:  Normal speech and language. Skin:  Skin is warm, dry and intact. No rash noted.  ____________________________________________   LABS (all labs ordered are listed, but only abnormal results are displayed)  Labs Reviewed  BASIC METABOLIC PANEL - Abnormal; Notable for the following components:      Result Value   Glucose, Bld 122 (*)    GFR calc non Af Amer 60 (*)    All other components within normal limits  CBC WITH DIFFERENTIAL/PLATELET - Abnormal; Notable for the following components:   RBC 1.93 (*)    Hemoglobin 4.8 (*)    HCT 17.1 (*)    MCH 24.9 (*)    MCHC 28.1 (*)    RDW 15.8 (*)    Eosinophils Absolute 0.6 (*)    All other components within normal limits  PROTIME-INR - Abnormal; Notable for the following components:   Prothrombin Time 16.1 (*)    INR 1.3 (*)    All other components within normal limits  SARS CORONAVIRUS 2 (HOSPITAL ORDER, [facility])  TYPE AND SCREEN  PREPARE RBC (CROSSMATCH)    ____________________________________________  EKG   EKG Interpretation  Date/Time:  Thursday August 26 2019 11:28:54 EDT Ventricular Rate:  76 PR Interval:    QRS Duration: 109 QT Interval:  439 QTC Calculation: 494 R Axis:   44 Text Interpretation:  Sinus rhythm RSR' in V1 or V2, probably normal variant Borderline prolonged QT interval No STEMI  Confirmed by Nanda Quinton [phone]) on 08/26/2019 11:46:40 AM       ____________________________________________   PROCEDURES  Procedure(s) performed:   Procedures  CRITICAL CARE Performed by: Margette Fast Total critical care time: 35 minutes Critical care time was exclusive of separately billable procedures and treating other patients. Critical care was necessary to treat or prevent imminent or life-threatening deterioration. Critical care was time spent personally by me on the following activities: development of treatment  plan with patient and/or surrogate as well as nursing, discussions with consultants, evaluation of patient's response to treatment, examination of patient, obtaining history from patient or surrogate, ordering and performing treatments and interventions, ordering and review of laboratory studies, ordering and review of radiographic studies, pulse oximetry and re-evaluation of patient's condition.  Nanda Quinton, MD Emergency Medicine  ____________________________________________   INITIAL IMPRESSION / ASSESSMENT AND PLAN / ED COURSE  Pertinent labs & imaging results that were available during my care of the patient were reviewed by me and considered in my medical decision making (see chart for details).   Patient presents to the emergency department evaluation of symptomatic anemia.  She has suspected GI bleeding with no clear etiology at this time.  She does remain on Eliquis.  She is followed by Dr. Laural Golden.   Hemoglobin resulting at 4.8.  Consented and ordered PRBCs starting with 2 units but will likely need  more.  Dr. Laural Golden is out of town and expected to return on Tuesday.  I spoke with Dr. Gala Romney.  Agrees with plan for PRBC transfusion, Protonix.  No emergent endoscopy while hospitalized.  Patient can be resuscitated and continue with Dr. Laural Golden as an outpatient. Will consult [facility].  Discussed patient's case with [facility], Dr. Manuella Ghazi to request admission. Patient and family (if present) updated with plan. Care transferred to [facility] service.  I reviewed all nursing notes, vitals, pertinent old records, EKGs, labs, imaging (as available).  ____________________________________________  FINAL CLINICAL IMPRESSION(S) / ED DIAGNOSES  Final diagnoses:  Gastrointestinal hemorrhage, unspecified gastrointestinal hemorrhage type  Symptomatic anemia    MEDICATIONS GIVEN DURING THIS VISIT:  Medications  0.9 %  sodium chloride infusion (has no administration in time range)  pantoprazole (PROTONIX) injection 80 mg (has no administration in time range)    Note:  This document was prepared using Dragon voice recognition software and may include unintentional dictation errors.  Nanda Quinton, MD, [facility] Emergency Medicine    , [facility], MD 08/26/19 [phone]

## 2019-08-26 NOTE — Telephone Encounter (Signed)
Patient presented to the ED with hemoglobin of 5.3.  Plans to admit overnight to receive 3 units of packed red blood cells.  Patient was seen in consultation. According to Dr. Olevia Perches note (last capsule study attempt), he had planned EGD with capsule placement if she dropped her hemoglobin again.  Patient declined having EGD with capsule placement during admission.  She would like to have this done with Dr.Rehman.   Dr. Gala Romney recommending EGD with capsule placement next week with Dr. Laural Golden. He advises considering doing ON Eliquis to assist with locating source of bleeding.     FYI. Patient with tardive dyskinesia type symptoms (mild) noticed while in ED. Also extreme dry mouth. Advise to discuss with Dr. Laural Golden before any further reglan use (she reports only taking one dose so symptoms could be unrelated).

## 2019-08-26 NOTE — ED Notes (Signed)
Date and time results received: 08/26/19 1224  Test: Hgb  Critical Value: 4.8  Name of Provider Notified: Dr. Laverta Baltimore  Orders Received? Or Actions Taken?: See chart

## 2019-08-26 NOTE — Telephone Encounter (Signed)
Contacted patient about HGB being 5.3.  Was told to go to [facility] for blood. Patient said she has already been several times for this and they keep her for 3 days and only give her 1 unit of blood. She says she does not want to gpo. Wanted [place] to set up her getting blood transfusion on out pateint. I told he rthat hgb was to critical for that and that she needed to go to the hospital NOW. The only response I could get was she would think about it.

## 2019-08-27 ENCOUNTER — Other Ambulatory Visit (INDEPENDENT_AMBULATORY_CARE_PROVIDER_SITE_OTHER): Payer: Self-pay | Admitting: *Deleted

## 2019-08-27 ENCOUNTER — Other Ambulatory Visit (INDEPENDENT_AMBULATORY_CARE_PROVIDER_SITE_OTHER): Payer: Self-pay | Admitting: Internal Medicine

## 2019-08-27 DIAGNOSIS — K922 Gastrointestinal hemorrhage, unspecified: Secondary | ICD-10-CM

## 2019-08-27 DIAGNOSIS — D62 Acute posthemorrhagic anemia: Secondary | ICD-10-CM | POA: Diagnosis not present

## 2019-08-27 DIAGNOSIS — D500 Iron deficiency anemia secondary to blood loss (chronic): Secondary | ICD-10-CM | POA: Diagnosis not present

## 2019-08-27 LAB — BASIC METABOLIC PANEL
Anion gap: 10 (ref 5–15)
BUN: 14 mg/dL (ref 8–23)
CO2: 27 mmol/L (ref 22–32)
Calcium: 8.8 mg/dL — ABNORMAL LOW (ref 8.9–10.3)
Chloride: 103 mmol/L (ref 98–111)
Creatinine, Ser: 0.98 mg/dL (ref 0.44–1.00)
GFR calc Af Amer: >60 mL/min (ref >60)
GFR calc non Af Amer: 55 mL/min — ABNORMAL LOW (ref >60)
Glucose, Bld: 100 mg/dL — ABNORMAL HIGH (ref 70–99)
Potassium: 3.8 mmol/L (ref 3.5–5.1)
Sodium: 140 mmol/L (ref 135–145)

## 2019-08-27 LAB — CBC
HCT: 30.6 % — ABNORMAL LOW (ref 36.0–46.0)
Hemoglobin: 9.6 g/dL — ABNORMAL LOW (ref 12.0–15.0)
MCH: 26.8 pg (ref 26.0–34.0)
MCHC: 31.4 g/dL (ref 30.0–36.0)
MCV: 85.5 fL (ref 80.0–100.0)
Platelets: 257 10*3/uL (ref 150–400)
RBC: 3.58 MIL/uL — ABNORMAL LOW (ref 3.87–5.11)
RDW: 14.4 % (ref 11.5–15.5)
WBC: 8.5 10*3/uL (ref 4.0–10.5)
nRBC: 0 % (ref 0.0–0.2)

## 2019-08-27 NOTE — Progress Notes (Signed)
IV x2 removed, telemetry removed. D/C instructions reviewed with patient, verbalized understanding. Patient to be transported to private vehicle via wheelchair. Will continue to monitor.

## 2019-08-27 NOTE — Discharge Summary (Signed)
Physician Discharge Summary  Misty [name] BSW:[id number] DOB: [birth year] DOA: 08/26/2019  PCP: Baruch Gouty, FNP  Admit date: 08/26/2019  Discharge date: 08/27/2019  Admitted From:Home  Disposition:  Home  Recommendations for Outpatient Follow-up:  1. Follow up with PCP in 1-2 weeks 2. Follow-up with Dr. Laural Golden early next week for further endoscopy evaluation 3. Continue to remain on Eliquis as recommended by GI 4. Continue on PPI twice daily  Home Health: None  Equipment/Devices: None  Discharge Condition: Stable  CODE STATUS: Full  Diet recommendation: Heart Healthy  Brief/Interim Summary: Per HPI: Misty [name] is a 78 y.o. female with medical history significant for recurrent GI bleeds with chronic blood loss anemia, paroxysmal atrial fibrillation on Eliquis, GERD, diverticulosis, celiac disease, anxiety and depression, systolic CHF, ischemic colitis, and pulmonary fibrosis with tobacco abuse who presented to her primary care doctor's office yesterday with complaints of weakness and had hemoglobin levels checked on 10/7.  She was called this morning about hemoglobin being 5.3 and was told to come to the ED for further evaluation.  She has ongoing persistent weakness as well as her usual dark stools.  She denies any abdominal pain, nausea, vomiting, lightheadedness, dizziness, fevers, chills, chest pain, or shortness of breath.  She has had recurrent admissions for PRBC transfusions and follows with Dr. Laural Golden as her gastroenterologist.  Patient was admitted with symptomatic acute on chronic blood loss anemia and has received 3 units of PRBCs with hemoglobin this morning of 9.6.  She has remained hemodynamically stable and has been assessed by GI with recommendations to remain on her PPI twice daily as well as Eliquis daily so that further diagnostic tests can be of higher yield in the near future with her gastroenterologist Dr. Laural Golden.  I have discussed calling Dr.Rehman's  office with the patient upon discharge to schedule follow-up early next week for further endoscopy.  She understands and agrees and will continue to remain on her Eliquis and PPI is currently is currently prescribed.  No other acute events noted throughout the course of the stay.  Discharge Diagnoses:  Active Problems:   Acute on chronic blood loss anemia  Physical discharge diagnosis: Symptomatic acute on chronic blood loss anemia status post 3 unit PRBC transfusion.  Discharge Instructions  Discharge Instructions    Diet - low sodium heart healthy   Complete by: As directed    Increase activity slowly   Complete by: As directed      Allergies as of 08/27/2019      Reactions   Codeine Nausea And Vomiting   Tramadol Nausea And Vomiting   Asa [aspirin] Other (See Comments)   Patient is unaware of allergy   Azithromycin Other (See Comments)   Patient is unaware of allergy   Celebrex [celecoxib] Other (See Comments)   Irritated stomach   Cymbalta [duloxetine Hcl] Other (See Comments)   Felt groggy   Prednisone Rash   She has seen the podiatrist and he had injected cortisone in her feet and she had also taken a peel at home. She had a severe reaction to her face with a rash and had to take Benadryl to resolve the symptom. She actually did get more cortisone shots, but no additional reactions to the shots.   Vioxx [rofecoxib] Other (See Comments)   Unknown   Zelnorm [tegaserod] Other (See Comments)   Patient is unaware of allergy   Zocor [simvastatin] Other (See Comments)   Patient is unaware of allergy  Medication List    TAKE these medications   acetaminophen 500 MG tablet Commonly known as: TYLENOL Take 500 mg by mouth every 8 (eight) hours as needed for mild pain, moderate pain or headache.   albuterol 108 (90 Base) MCG/ACT inhaler Commonly known as: VENTOLIN HFA Inhale 2 puffs into the lungs every 6 (six) hours as needed for wheezing or shortness of breath  (cough).   apixaban 5 MG Tabs tablet Commonly known as: ELIQUIS Take 1 tablet (5 mg total) by mouth 2 (two) times daily. Restart on 08/16/19   bisoprolol 5 MG tablet Commonly known as: ZEBETA Take 0.5 tablets (2.5 mg total) by mouth daily.   cholecalciferol 1000 units tablet Commonly known as: VITAMIN D Take 2,000 Units by mouth daily.   escitalopram 20 MG tablet Commonly known as: LEXAPRO TAKE ONE TABLET AT BEDTIME   furosemide 20 MG tablet Commonly known as: LASIX Take 1 tablet (20 mg total) by mouth daily.   metoCLOPramide 5 MG tablet Commonly known as: Reglan Take 1 tablet (5 mg total) by mouth 3 (three) times daily before meals.   multivitamin with minerals Tabs tablet Take 1 tablet by mouth daily. Centrum Silver   pantoprazole 40 MG tablet Commonly known as: PROTONIX Take 1 tablet (40 mg total) by mouth 2 (two) timesdaily before a meal. What changed: See the new instructions.   potassium chloride SA 20 MEQ tablet Commonly known as: KLOR-CON Take 1 tablet (20 mEq total) by mouth daily for 30 days.   vitamin B-12 1000 MCG tablet Commonly known as: CYANOCOBALAMIN Take 1,000 mcg by mouth daily.      Follow-up Information    Baruch Gouty, FNP Follow up in 1 week(s).   Specialty: Family Medicine Contact information: [place] [address] [phone]        Minus Breeding, MD .   Specialty: Cardiology Contact information: [address] [place] [address] [phone]        Rogene Houston, MD. Schedule an appointment as soon as possible for a visit in 5 day(s).   Specialty: Gastroenterology Contact information: [address], [address] [place] [address] [phone]          Allergies  Allergen Reactions  . Codeine Nausea And Vomiting  . Tramadol Nausea And Vomiting  . Asa [Aspirin] Other (See Comments)    Patient is unaware of allergy  . Azithromycin Other (See Comments)    Patient is unaware of  allergy  . Celebrex [Celecoxib] Other (See Comments)    Irritated stomach   . Cymbalta [Duloxetine Hcl] Other (See Comments)    Felt groggy  . Prednisone Rash    She has seen the podiatrist and he had injected cortisone in her feet and she had also taken a peel at home. She had a severe reaction to her face with a rash and had to take Benadryl to resolve the symptom. She actually did get more cortisone shots, but no additional reactions to the shots.  . Vioxx [Rofecoxib] Other (See Comments)    Unknown  . Zelnorm [Tegaserod] Other (See Comments)    Patient is unaware of allergy  . Zocor [Simvastatin] Other (See Comments)    Patient is unaware of allergy    Consultations:  GI   Procedures/Studies: Nm Gi Blood Loss  Result Date: 08/12/2019 CLINICAL DATA:  Chronic anemia question GI blood loss EXAM: NUCLEAR MEDICINE GASTROINTESTINAL BLEEDING SCAN TECHNIQUE: Sequential abdominal images were obtained following intravenous  administration of Tc-68mlabeled red blood cells. RADIOPHARMACEUTICALS:  125.1 mCi Tc-978mertechnetate in-vitro labeled red cells. COMPARISON:  None FINDINGS: Intermittent artifacts from the patient's RIGHT arm. Normal blood pool distribution of labeled red cells. No abnormal gastrointestinal localization of tracer identified through 2 hours of imaging to suggest active GI bleeding. Small amount of de labeled tracer within urinary bladder. IMPRESSION: Negative GI bleeding scan. Electronically Signed   By: MaLavonia Dana.D.   On: 08/12/2019 10:49   Ct Angio Abd/pel W/ And/or W/o  Result Date: 07/30/2019 CLINICAL DATA:  [age]ear old female with history of GI bleed EXAM: CTA ABDOMEN AND PELVIS wITHOUT AND WITH CONTRAST TECHNIQUE: Multidetector CT imaging of the abdomen and pelvis was performed using the standard protocol during bolus administration of intravenous contrast. Multiplanar reconstructed images and MIPs were obtained and reviewed to evaluate the vascular anatomy.  CONTRAST:  10039mMNIPAQUE IOHEXOL 350 MG/ML SOLN COMPARISON:  July 19, 2016 FINDINGS: VASCULAR Aorta: Atherosclerotic changes of the lower thoracic and the abdominal aorta. Greatest diameter of the thoracic aorta at the hiatus measures 3.2 cm. Greatest diameter of the abdominal aorta is suprarenal, measuring 3.1 cm at the level of the SMA origin. No dissection flap.  No periaortic fluid. Celiac: Atherosclerotic changes at the celiac artery origin with less than 50% stenosis. Branches remain patent. SMA: Dense atherosclerotic calcifications at the SMA origin with likely high-grade stenosis secondary to calcified plaque. Renals: On the right there are 2 renal arteries originating at the 10 o'clock position cranially and the 11 o'clock position caudally. Both of these demonstrate significant atherosclerotic changes with estimated 50% narrowing. On the left, the main renal artery originates at the 3 o'clock position with atherosclerotic changes at the origin. There is a small accessory renal artery to the lower pole originating at the 2 o'clock position which remains patent. IMA: IMA is patent. Right lower extremity: Moderate atherosclerotic changes of the right iliac system. Diameter of the common iliac arteries 13 mm. Calcifications of the hypogastric artery which remains patent. External iliac artery is patent. Common femoral artery patent. Proximal profunda femoris and SFA patent. Left lower extremity: Moderate atherosclerotic changes of the left iliac system. Diameter of the common iliac artery measures 11 mm. Hypogastric artery remains patent. External iliac artery is patent. Common femoral artery patent. Proximal profunda femoris and SFA are patent. Veins: Unremarkable appearance of the venous system. Review of the MIP images confirms the above findings. NON-VASCULAR Lower chest: No acute. Hepatobiliary: Redemonstration of small low-density cysts of left and right liver, similar distribution and size to the  comparison CT of 2017. Gallbladder is decompressed, with no calcified cholelithiasis. Pancreas: Unremarkable Spleen: Unremarkable Adrenals/Urinary Tract: Unremarkable appearance of adrenal glands. Right: Perfusion symmetric to the left kidney. No hydronephrosis or nephrolithiasis. Unremarkable course of the right ureter. Left: Symmetric perfusion to the right. No hydronephrosis. No nephrolithiasis. Unremarkable course of the left ureter. Unremarkable appearance of the urinary bladder . Stomach/Bowel: No accumulation of contrast that with identify or confirm a source of gastrointestinal hemorrhage. The endoscopy capsule is present within the stomach lumen. Otherwise unremarkable stomach. Unremarkable appearance of small bowel. No evidence of obstruction. Normal appendix. large fecal stool burden throughout the colon with no focal wall thickening or significant inflammatory changes. Diverticular change present. Lymphatic: No lymphadenopathy. Mesenteric: There is trace fluid within the anatomic pelvis without focal inflammatory changes within the abdomen that may suggest a etiology. Reproductive: Hysterectomy Other: No hernia. Musculoskeletal: Surgical changes of left hip repair with incomplete remodeling/healing at the  fracture site. Osteopenia. Multiple levels of vertebral augmentation, similar to prior. No new fracture identified. IMPRESSION: No acute abnormality, with no evidence of acute gastrointestinal hemorrhage. Large fecal stool burden within the colon. Aortic atherosclerosis with associated mesenteric and bilateral renal arterial disease. All 3 mesenteric arteries remain patent, with high-grade stenosis at the origin of the SMA. Bilateral moderate iliac arterial disease with no high-grade stenosis or occlusion. Additional ancillary findings as above. Signed, Dulcy Fanny. Dellia Nims, RPVI [facility] Electronically Signed   By: Corrie Mckusick D.O.   On:  07/30/2019 11:21     Discharge Exam: Vitals:   08/27/19 0323 08/27/19 0527  BP: 128/69 (!) 125/59  Pulse: 76 71  Resp: 19 18  Temp: 98.5 F (36.9 C) 98.3 F (36.8 C)  SpO2: 96% 91%   Vitals:   08/27/19 0050 08/27/19 0105 08/27/19 0323 08/27/19 0527  BP: (!) 146/68 130/66 128/69 (!) 125/59  Pulse: 82 77 76 71  Resp: 20 20 19 18   Temp: 98.2 F (36.8 C) 97.8 F (36.6 C) 98.5 F (36.9 C) 98.3 F (36.8 C)  TempSrc: Oral  Oral Oral  SpO2: 95%  96% 91%  Weight:      Height:        General: Pt is alert, awake, not in acute distress Cardiovascular: RRR, S1/S2 +, no rubs, no gallops Respiratory: CTA bilaterally, no wheezing, no rhonchi Abdominal: Soft, NT, ND, bowel sounds + Extremities: no edema, no cyanosis    The results of significant diagnostics from this hospitalization (including imaging, microbiology, ancillary and laboratory) are listed below for reference.     Microbiology: Recent Results (from the past 240 hour(s))  SARS Coronavirus 2 [facility] order, Performed in [facility] hospital lab) Nasopharyngeal Nasopharyngeal Swab     Status: None   Collection Time: 08/26/19  1:11 PM   Specimen: Nasopharyngeal Swab  Result Value Ref Range Status   SARS Coronavirus 2 NEGATIVE NEGATIVE Final    Comment: (NOTE) If result is NEGATIVE SARS-CoV-2 target nucleic acids are NOT DETECTED. The SARS-CoV-2 RNA is generally detectable in upper and lower  respiratory specimens during the acute phase of infection. The lowest  concentration of SARS-CoV-2 viral copies this assay can detect is 250  copies / mL. A negative result does not preclude SARS-CoV-2 infection  and should not be used as the sole basis for treatment or other  patient management decisions.  A negative result may occur with  improper specimen collection / handling, submission of specimen other  than nasopharyngeal swab, presence of viral mutation(s) within the  areas targeted by this assay, and inadequate  number of viral copies  (<250 copies / mL). A negative result must be combined with clinical  observations, patient history, and epidemiological information. If result is POSITIVE SARS-CoV-2 target nucleic acids are DETECTED. The SARS-CoV-2 RNA is generally detectable in upper and lower  respiratory specimens dur ing the acute phase of infection.  Positive  results are indicative of active infection with SARS-CoV-2.  Clinical  correlation with patient history and other diagnostic information is  necessary to determine patient infection status.  Positive results do  not rule out bacterial infection or co-infection with other viruses. If result is PRESUMPTIVE POSTIVE SARS-CoV-2 nucleic acids MAY BE PRESENT.   A presumptive positive result was obtained on the submitted specimen  and confirmed on repeat testing.  While 2019 novel coronavirus  (SARS-CoV-2) nucleic acids may be present in the submitted sample  additional confirmatory  testing may be necessary for epidemiological  and / or clinical management purposes  to differentiate between  SARS-CoV-2 and other Sarbecovirus currently known to infect humans.  If clinically indicated additional testing with an alternate test  methodology [phone]) is advised. The SARS-CoV-2 RNA is generally  detectable in upper and lower respiratory sp ecimens during the acute  phase of infection. The expected result is Negative. Fact Sheet for Patients:  StrictlyIdeas.no Fact Sheet for Healthcare Providers: BankingDealers.co.za This test is not yet approved or cleared by the [place] FDA and has been authorized for detection and/or diagnosis of SARS-CoV-2 by FDA under an Emergency Use Authorization (EUA).  This EUA will remain in effect (meaning this test can be used) for the duration of the COVID-19 declaration under Section 564(b)(1) of the Act, 21 U.S.C. section 360bbb-3(b)(1), unless the  authorization is terminated or revoked sooner. Performed at [facility], [address], [place], [place] [address]      Labs: BNP (last 3 results) Recent Labs    03/25/19 2033 07/10/19 1421  BNP >4,500.0* 419.3*   Basic Metabolic Panel: Recent Labs  Lab 08/25/19 1240 08/26/19 1135 08/27/19 0620  NA 138 137 140  K 4.5 4.1 3.8  CL 100 102 103  CO2 26 26 27   GLUCOSE 117* 122* 100*  BUN 19 16 14   CREATININE 1.01* 0.92 0.98  CALCIUM 8.9 9.0 8.8*   Liver Function Tests: Recent Labs  Lab 08/25/19 1240  AST 11  ALT 8  ALKPHOS 133*  BILITOT <0.2  PROT 5.8*  ALBUMIN 3.7   No results for input(s): LIPASE, AMYLASE in the last 168 hours. No results for input(s): AMMONIA in the last 168 hours. CBC: Recent Labs  Lab 08/25/19 1240 08/26/19 1135 08/27/19 0620  WBC 8.7 7.2 8.5  NEUTROABS 6.3 5.2  --   HGB 5.3* 4.8* 9.6*  HCT 17.7* 17.1* 30.6*  MCV 83 88.6 85.5  PLT 280 260 257   Cardiac Enzymes: No results for input(s): CKTOTAL, CKMB, CKMBINDEX, TROPONINI in the last 168 hours. BNP: Invalid input(s): POCBNP CBG: No results for input(s): GLUCAP in the last 168 hours. D-Dimer No results for input(s): DDIMER in the last 72 hours. Hgb A1c No results for input(s): HGBA1C in the last 72 hours. Lipid Profile No results for input(s): CHOL, HDL, LDLCALC, TRIG, CHOLHDL, LDLDIRECT in the last 72 hours. Thyroid function studies No results for input(s): TSH, T4TOTAL, T3FREE, THYROIDAB in the last 72 hours.  Invalid input(s): FREET3 Anemia work up No results for input(s): VITAMINB12, FOLATE, FERRITIN, TIBC, IRON, RETICCTPCT in the last 72 hours. Urinalysis    Component Value Date/Time   COLORURINE YELLOW 01/14/2019 0226   APPEARANCEUR CLEAR 01/14/2019 0226   LABSPEC 1.012 01/14/2019 0226   PHURINE 7.0 01/14/2019 0226   GLUCOSEU NEGATIVE 01/14/2019 0226   HGBUR NEGATIVE 01/14/2019 0226   BILIRUBINUR NEGATIVE 01/14/2019 0226   KETONESUR NEGATIVE 01/14/2019 0226    PROTEINUR NEGATIVE 01/14/2019 0226   NITRITE NEGATIVE 01/14/2019 0226   LEUKOCYTESUR NEGATIVE 01/14/2019 0226   Sepsis Labs Invalid input(s): PROCALCITONIN,  WBC,  LACTICIDVEN Microbiology Recent Results (from the past 240 hour(s))  SARS Coronavirus 2 [facility] order, Performed in [facility] hospital lab) Nasopharyngeal Nasopharyngeal Swab     Status: None   Collection Time: 08/26/19  1:11 PM   Specimen: Nasopharyngeal Swab  Result Value Ref Range Status   SARS Coronavirus 2 NEGATIVE NEGATIVE Final    Comment: (NOTE) If result is NEGATIVE SARS-CoV-2 target nucleic acids are NOT  DETECTED. The SARS-CoV-2 RNA is generally detectable in upper and lower  respiratory specimens during the acute phase of infection. The lowest  concentration of SARS-CoV-2 viral copies this assay can detect is 250  copies / mL. A negative result does not preclude SARS-CoV-2 infection  and should not be used as the sole basis for treatment or other  patient management decisions.  A negative result may occur with  improper specimen collection / handling, submission of specimen other  than nasopharyngeal swab, presence of viral mutation(s) within the  areas targeted by this assay, and inadequate number of viral copies  (<250 copies / mL). A negative result must be combined with clinical  observations, patient history, and epidemiological information. If result is POSITIVE SARS-CoV-2 target nucleic acids are DETECTED. The SARS-CoV-2 RNA is generally detectable in upper and lower  respiratory specimens dur ing the acute phase of infection.  Positive  results are indicative of active infection with SARS-CoV-2.  Clinical  correlation with patient history and other diagnostic information is  necessary to determine patient infection status.  Positive results do  not rule out bacterial infection or co-infection with other viruses. If result is PRESUMPTIVE POSTIVE SARS-CoV-2 nucleic acids MAY BE PRESENT.   A  presumptive positive result was obtained on the submitted specimen  and confirmed on repeat testing.  While 2019 novel coronavirus  (SARS-CoV-2) nucleic acids may be present in the submitted sample  additional confirmatory testing may be necessary for epidemiological  and / or clinical management purposes  to differentiate between  SARS-CoV-2 and other Sarbecovirus currently known to infect humans.  If clinically indicated additional testing with an alternate test  methodology [phone]) is advised. The SARS-CoV-2 RNA is generally  detectable in upper and lower respiratory sp ecimens during the acute  phase of infection. The expected result is Negative. Fact Sheet for Patients:  StrictlyIdeas.no Fact Sheet for Healthcare Providers: BankingDealers.co.za This test is not yet approved or cleared by the [place] FDA and has been authorized for detection and/or diagnosis of SARS-CoV-2 by FDA under an Emergency Use Authorization (EUA).  This EUA will remain in effect (meaning this test can be used) for the duration of the COVID-19 declaration under Section 564(b)(1) of the Act, 21 U.S.C. section 360bbb-3(b)(1), unless the authorization is terminated or revoked sooner. Performed at [facility], [address] [place], [place] [address]      Time coordinating discharge: 35 minutes  SIGNED:   Rodena Goldmann, DO [facility] 08/27/2019, 10:55 AM  If 7PM-7AM, please contact night-coverage [email or web address] Password [email or web address]

## 2019-08-28 LAB — BPAM RBC
Blood Product Expiration Date: 202011122359
Blood Product Expiration Date: 202011122359
Blood Product Expiration Date: 202011122359
ISSUE DATE / TIME: 202010081351
ISSUE DATE / TIME: 202010082008
ISSUE DATE / TIME: 202010090045
Unit Type and Rh: 5100
Unit Type and Rh: 5100
Unit Type and Rh: 5100

## 2019-08-28 LAB — TYPE AND SCREEN
ABO/RH(D): O POS
Antibody Screen: NEGATIVE
Unit division: 0
Unit division: 0
Unit division: 0

## 2019-08-28 NOTE — Telephone Encounter (Signed)
Spoke with Reba with Dr. Olevia Perches office. Plans for EGD with capsule placement for 10/14.

## 2019-08-30 ENCOUNTER — Other Ambulatory Visit: Payer: Self-pay | Admitting: *Deleted

## 2019-08-30 NOTE — Patient Outreach (Signed)
[facility]) Care Management  08/30/2019  LATRESHA YAHR 1941/04/03 [id number]    Telephone Assessment-Unsuccessful  RN has made several outreach attempts and another to call today placed to [employer] listed as a contact. Note unable to leave a message to other contact number. RN left a HIPAA approved voice message to [employer]. Will further inquire on how to possibly reach the pt and further engage at that time.  Will follow up in a few weeks to allow a call back if possible for pending services. Note outreach letter has been sent.   Raina Mina, RN Care Management Coordinator [place] Office [phone]

## 2019-08-31 ENCOUNTER — Other Ambulatory Visit (HOSPITAL_COMMUNITY): Payer: Medicare Other

## 2019-09-01 NOTE — Telephone Encounter (Signed)
Noted  

## 2019-09-06 NOTE — Telephone Encounter (Signed)
Patient is scheduled for EGD with endoscopic placement of given capsule on 09/15/2019

## 2019-09-07 ENCOUNTER — Telehealth (INDEPENDENT_AMBULATORY_CARE_PROVIDER_SITE_OTHER): Payer: Self-pay | Admitting: *Deleted

## 2019-09-07 NOTE — Telephone Encounter (Signed)
Patient wants to know if she needs to have blood work done

## 2019-09-10 ENCOUNTER — Other Ambulatory Visit (HOSPITAL_COMMUNITY): Payer: Self-pay | Admitting: *Deleted

## 2019-09-13 ENCOUNTER — Other Ambulatory Visit (HOSPITAL_COMMUNITY)
Admission: RE | Admit: 2019-09-13 | Discharge: 2019-09-13 | Disposition: A | Discharge status: Home / Self Care | Payer: Medicare Other | Source: Ambulatory Visit | Attending: Internal Medicine | Admitting: Internal Medicine

## 2019-09-13 ENCOUNTER — Telehealth (INDEPENDENT_AMBULATORY_CARE_PROVIDER_SITE_OTHER): Payer: Self-pay | Admitting: *Deleted

## 2019-09-13 NOTE — Telephone Encounter (Signed)
Patient called and canceled EGD w [facility] for Wednesday, states she has diarrhea, procedure canceled Wants to know if Dr Laural Golden wants her to do blood work - please call her about blood work

## 2019-09-15 ENCOUNTER — Ambulatory Visit (HOSPITAL_COMMUNITY): Admission: RE | Admit: 2019-09-15 | Payer: Medicare Other | Source: Home / Self Care | Admitting: Internal Medicine

## 2019-09-15 ENCOUNTER — Encounter (HOSPITAL_COMMUNITY): Admission: RE | Payer: Self-pay | Source: Home / Self Care

## 2019-09-15 SURGERY — EGD (ESOPHAGOGASTRODUODENOSCOPY)
Anesthesia: Moderate Sedation

## 2019-09-15 NOTE — Telephone Encounter (Signed)
This will need to be addressed with NUR. Patient had lab work drawn on 08/27/19 by Dr. Heath Lark. Her Hgb was 9.6 at that time.

## 2019-09-15 NOTE — Telephone Encounter (Signed)
This will need to be addressed with Dr.Rehman. The patient has had recent lab drawn from PCP.

## 2019-09-16 ENCOUNTER — Other Ambulatory Visit (INDEPENDENT_AMBULATORY_CARE_PROVIDER_SITE_OTHER): Payer: Self-pay | Admitting: *Deleted

## 2019-09-16 DIAGNOSIS — D649 Anemia, unspecified: Secondary | ICD-10-CM

## 2019-09-16 NOTE — Telephone Encounter (Signed)
Per Dr.Rehman the patient may have her H&H drawn. The patient was called and made aware. She states that she will have it done when she can get down to [place].  The order has ben sent to them.

## 2019-09-16 NOTE — Progress Notes (Signed)
Lab order has been completed.

## 2019-09-20 ENCOUNTER — Other Ambulatory Visit: Payer: Self-pay | Admitting: *Deleted

## 2019-09-20 NOTE — Patient Outreach (Signed)
[facility]) Care Management  09/20/2019  Misty [name] 11-09-1941 [id number]    Case Closure  After several unsuccessful outreach attempts this case will be closed. No response to outreach letter and primary provider has been updated.   Plan: Will close this case from further contact at this time.  Raina Mina, RN Care Management Coordinator [place] Office [phone]

## 2019-10-01 ENCOUNTER — Encounter (HOSPITAL_COMMUNITY): Payer: Self-pay

## 2019-10-01 ENCOUNTER — Inpatient Hospital Stay (HOSPITAL_COMMUNITY)
Admission: EM | Admit: 2019-10-01 | Discharge: 2019-10-04 | DRG: 812 | Disposition: A | Discharge status: Home / Self Care | Payer: Medicare Other | Attending: Internal Medicine | Admitting: Internal Medicine

## 2019-10-01 ENCOUNTER — Other Ambulatory Visit: Payer: Self-pay

## 2019-10-01 DIAGNOSIS — K766 Portal hypertension: Secondary | ICD-10-CM | POA: Diagnosis not present

## 2019-10-01 DIAGNOSIS — I480 Paroxysmal atrial fibrillation: Secondary | ICD-10-CM | POA: Diagnosis present

## 2019-10-01 DIAGNOSIS — I5022 Chronic systolic (congestive) heart failure: Secondary | ICD-10-CM | POA: Diagnosis not present

## 2019-10-01 DIAGNOSIS — J8410 Pulmonary fibrosis, unspecified: Secondary | ICD-10-CM | POA: Diagnosis present

## 2019-10-01 DIAGNOSIS — Z8619 Personal history of other infectious and parasitic diseases: Secondary | ICD-10-CM

## 2019-10-01 DIAGNOSIS — Z8379 Family history of other diseases of the digestive system: Secondary | ICD-10-CM

## 2019-10-01 DIAGNOSIS — I10 Essential (primary) hypertension: Secondary | ICD-10-CM | POA: Diagnosis not present

## 2019-10-01 DIAGNOSIS — K3189 Other diseases of stomach and duodenum: Secondary | ICD-10-CM | POA: Diagnosis not present

## 2019-10-01 DIAGNOSIS — R0902 Hypoxemia: Secondary | ICD-10-CM | POA: Diagnosis not present

## 2019-10-01 DIAGNOSIS — F17210 Nicotine dependence, cigarettes, uncomplicated: Secondary | ICD-10-CM | POA: Diagnosis present

## 2019-10-01 DIAGNOSIS — Z743 Need for continuous supervision: Secondary | ICD-10-CM | POA: Diagnosis not present

## 2019-10-01 DIAGNOSIS — D500 Iron deficiency anemia secondary to blood loss (chronic): Secondary | ICD-10-CM | POA: Diagnosis not present

## 2019-10-01 DIAGNOSIS — E860 Dehydration: Secondary | ICD-10-CM | POA: Diagnosis present

## 2019-10-01 DIAGNOSIS — Z20828 Contact with and (suspected) exposure to other viral communicable diseases: Secondary | ICD-10-CM | POA: Diagnosis not present

## 2019-10-01 DIAGNOSIS — K6389 Other specified diseases of intestine: Secondary | ICD-10-CM | POA: Diagnosis not present

## 2019-10-01 DIAGNOSIS — R531 Weakness: Secondary | ICD-10-CM | POA: Diagnosis present

## 2019-10-01 DIAGNOSIS — Z90710 Acquired absence of both cervix and uterus: Secondary | ICD-10-CM

## 2019-10-01 DIAGNOSIS — K5730 Diverticulosis of large intestine without perforation or abscess without bleeding: Secondary | ICD-10-CM | POA: Diagnosis present

## 2019-10-01 DIAGNOSIS — F418 Other specified anxiety disorders: Secondary | ICD-10-CM | POA: Diagnosis not present

## 2019-10-01 DIAGNOSIS — M810 Age-related osteoporosis without current pathological fracture: Secondary | ICD-10-CM | POA: Diagnosis present

## 2019-10-01 DIAGNOSIS — K900 Celiac disease: Secondary | ICD-10-CM | POA: Diagnosis present

## 2019-10-01 DIAGNOSIS — D649 Anemia, unspecified: Secondary | ICD-10-CM | POA: Diagnosis not present

## 2019-10-01 DIAGNOSIS — Z888 Allergy status to other drugs, medicaments and biological substances status: Secondary | ICD-10-CM

## 2019-10-01 DIAGNOSIS — K31819 Angiodysplasia of stomach and duodenum without bleeding: Secondary | ICD-10-CM | POA: Diagnosis not present

## 2019-10-01 DIAGNOSIS — M479 Spondylosis, unspecified: Secondary | ICD-10-CM | POA: Diagnosis present

## 2019-10-01 DIAGNOSIS — K2100 Gastro-esophageal reflux disease with esophagitis, without bleeding: Secondary | ICD-10-CM | POA: Diagnosis not present

## 2019-10-01 DIAGNOSIS — K633 Ulcer of intestine: Secondary | ICD-10-CM | POA: Diagnosis present

## 2019-10-01 DIAGNOSIS — J449 Chronic obstructive pulmonary disease, unspecified: Secondary | ICD-10-CM | POA: Diagnosis present

## 2019-10-01 DIAGNOSIS — K229 Disease of esophagus, unspecified: Secondary | ICD-10-CM | POA: Diagnosis not present

## 2019-10-01 DIAGNOSIS — K3184 Gastroparesis: Secondary | ICD-10-CM | POA: Diagnosis present

## 2019-10-01 DIAGNOSIS — B3781 Candidal esophagitis: Secondary | ICD-10-CM | POA: Diagnosis present

## 2019-10-01 DIAGNOSIS — K449 Diaphragmatic hernia without obstruction or gangrene: Secondary | ICD-10-CM | POA: Diagnosis present

## 2019-10-01 DIAGNOSIS — D62 Acute posthemorrhagic anemia: Principal | ICD-10-CM | POA: Diagnosis present

## 2019-10-01 DIAGNOSIS — R112 Nausea with vomiting, unspecified: Secondary | ICD-10-CM | POA: Diagnosis not present

## 2019-10-01 DIAGNOSIS — Z885 Allergy status to narcotic agent status: Secondary | ICD-10-CM

## 2019-10-01 DIAGNOSIS — Z8249 Family history of ischemic heart disease and other diseases of the circulatory system: Secondary | ICD-10-CM

## 2019-10-01 DIAGNOSIS — Z79899 Other long term (current) drug therapy: Secondary | ICD-10-CM

## 2019-10-01 DIAGNOSIS — Z8719 Personal history of other diseases of the digestive system: Secondary | ICD-10-CM

## 2019-10-01 DIAGNOSIS — F419 Anxiety disorder, unspecified: Secondary | ICD-10-CM | POA: Diagnosis present

## 2019-10-01 DIAGNOSIS — Z7901 Long term (current) use of anticoagulants: Secondary | ICD-10-CM

## 2019-10-01 DIAGNOSIS — Z881 Allergy status to other antibiotic agents status: Secondary | ICD-10-CM

## 2019-10-01 DIAGNOSIS — I959 Hypotension, unspecified: Secondary | ICD-10-CM | POA: Diagnosis not present

## 2019-10-01 DIAGNOSIS — Z9111 Patient's noncompliance with dietary regimen: Secondary | ICD-10-CM

## 2019-10-01 DIAGNOSIS — F329 Major depressive disorder, single episode, unspecified: Secondary | ICD-10-CM | POA: Diagnosis present

## 2019-10-01 DIAGNOSIS — R000 Tachycardia, unspecified: Secondary | ICD-10-CM | POA: Diagnosis not present

## 2019-10-01 DIAGNOSIS — G8929 Other chronic pain: Secondary | ICD-10-CM | POA: Diagnosis present

## 2019-10-01 DIAGNOSIS — Z886 Allergy status to analgesic agent status: Secondary | ICD-10-CM

## 2019-10-01 DIAGNOSIS — K922 Gastrointestinal hemorrhage, unspecified: Secondary | ICD-10-CM | POA: Diagnosis not present

## 2019-10-01 MED ORDER — LACTATED RINGERS IV BOLUS
1000.0000 mL | Freq: Once | INTRAVENOUS | Status: AC
  Administered 2019-10-01: 1000 mL via INTRAVENOUS

## 2019-10-01 NOTE — ED Triage Notes (Signed)
Pt in by [facility] from home, vomiting x several days, denies diarrhea.  Pt c/o general weakness.

## 2019-10-02 ENCOUNTER — Encounter (HOSPITAL_COMMUNITY): Payer: Self-pay

## 2019-10-02 DIAGNOSIS — K31819 Angiodysplasia of stomach and duodenum without bleeding: Secondary | ICD-10-CM | POA: Diagnosis present

## 2019-10-02 DIAGNOSIS — M810 Age-related osteoporosis without current pathological fracture: Secondary | ICD-10-CM | POA: Diagnosis present

## 2019-10-02 DIAGNOSIS — I480 Paroxysmal atrial fibrillation: Secondary | ICD-10-CM | POA: Diagnosis present

## 2019-10-02 DIAGNOSIS — Z90710 Acquired absence of both cervix and uterus: Secondary | ICD-10-CM | POA: Diagnosis not present

## 2019-10-02 DIAGNOSIS — R531 Weakness: Secondary | ICD-10-CM | POA: Diagnosis present

## 2019-10-02 DIAGNOSIS — K3189 Other diseases of stomach and duodenum: Secondary | ICD-10-CM | POA: Diagnosis present

## 2019-10-02 DIAGNOSIS — K449 Diaphragmatic hernia without obstruction or gangrene: Secondary | ICD-10-CM | POA: Diagnosis present

## 2019-10-02 DIAGNOSIS — Z20828 Contact with and (suspected) exposure to other viral communicable diseases: Secondary | ICD-10-CM | POA: Diagnosis present

## 2019-10-02 DIAGNOSIS — F419 Anxiety disorder, unspecified: Secondary | ICD-10-CM | POA: Diagnosis present

## 2019-10-02 DIAGNOSIS — K6389 Other specified diseases of intestine: Secondary | ICD-10-CM | POA: Diagnosis not present

## 2019-10-02 DIAGNOSIS — D649 Anemia, unspecified: Secondary | ICD-10-CM

## 2019-10-02 DIAGNOSIS — F418 Other specified anxiety disorders: Secondary | ICD-10-CM | POA: Diagnosis not present

## 2019-10-02 DIAGNOSIS — K229 Disease of esophagus, unspecified: Secondary | ICD-10-CM | POA: Diagnosis not present

## 2019-10-02 DIAGNOSIS — K900 Celiac disease: Secondary | ICD-10-CM | POA: Diagnosis present

## 2019-10-02 DIAGNOSIS — D500 Iron deficiency anemia secondary to blood loss (chronic): Secondary | ICD-10-CM

## 2019-10-02 DIAGNOSIS — G8929 Other chronic pain: Secondary | ICD-10-CM | POA: Diagnosis present

## 2019-10-02 DIAGNOSIS — E860 Dehydration: Secondary | ICD-10-CM | POA: Diagnosis present

## 2019-10-02 DIAGNOSIS — K2100 Gastro-esophageal reflux disease with esophagitis, without bleeding: Secondary | ICD-10-CM | POA: Diagnosis present

## 2019-10-02 DIAGNOSIS — M479 Spondylosis, unspecified: Secondary | ICD-10-CM | POA: Diagnosis present

## 2019-10-02 DIAGNOSIS — B3781 Candidal esophagitis: Secondary | ICD-10-CM | POA: Diagnosis present

## 2019-10-02 DIAGNOSIS — K766 Portal hypertension: Secondary | ICD-10-CM | POA: Diagnosis present

## 2019-10-02 DIAGNOSIS — K5730 Diverticulosis of large intestine without perforation or abscess without bleeding: Secondary | ICD-10-CM | POA: Diagnosis present

## 2019-10-02 DIAGNOSIS — J8410 Pulmonary fibrosis, unspecified: Secondary | ICD-10-CM | POA: Diagnosis present

## 2019-10-02 DIAGNOSIS — J449 Chronic obstructive pulmonary disease, unspecified: Secondary | ICD-10-CM | POA: Diagnosis present

## 2019-10-02 DIAGNOSIS — F17210 Nicotine dependence, cigarettes, uncomplicated: Secondary | ICD-10-CM | POA: Diagnosis present

## 2019-10-02 DIAGNOSIS — I5022 Chronic systolic (congestive) heart failure: Secondary | ICD-10-CM | POA: Diagnosis present

## 2019-10-02 DIAGNOSIS — K3184 Gastroparesis: Secondary | ICD-10-CM | POA: Diagnosis present

## 2019-10-02 DIAGNOSIS — F329 Major depressive disorder, single episode, unspecified: Secondary | ICD-10-CM | POA: Diagnosis present

## 2019-10-02 DIAGNOSIS — I10 Essential (primary) hypertension: Secondary | ICD-10-CM | POA: Diagnosis not present

## 2019-10-02 DIAGNOSIS — K633 Ulcer of intestine: Secondary | ICD-10-CM | POA: Diagnosis present

## 2019-10-02 DIAGNOSIS — D62 Acute posthemorrhagic anemia: Secondary | ICD-10-CM | POA: Diagnosis present

## 2019-10-02 DIAGNOSIS — K922 Gastrointestinal hemorrhage, unspecified: Secondary | ICD-10-CM | POA: Diagnosis not present

## 2019-10-02 LAB — COMPREHENSIVE METABOLIC PANEL
ALT: 13 U/L (ref 0–44)
AST: 19 U/L (ref 15–41)
Albumin: 3 g/dL — ABNORMAL LOW (ref 3.5–5.0)
Alkaline Phosphatase: 83 U/L (ref 38–126)
Anion gap: 13 (ref 5–15)
BUN: 34 mg/dL — ABNORMAL HIGH (ref 8–23)
CO2: 15 mmol/L — ABNORMAL LOW (ref 22–32)
Calcium: 7.9 mg/dL — ABNORMAL LOW (ref 8.9–10.3)
Chloride: 107 mmol/L (ref 98–111)
Creatinine, Ser: 1.33 mg/dL — ABNORMAL HIGH (ref 0.44–1.00)
GFR calc Af Amer: 44 mL/min — ABNORMAL LOW (ref >60)
GFR calc non Af Amer: 38 mL/min — ABNORMAL LOW (ref >60)
Glucose, Bld: 145 mg/dL — ABNORMAL HIGH (ref 70–99)
Potassium: 4.5 mmol/L (ref 3.5–5.1)
Sodium: 135 mmol/L (ref 135–145)
Total Bilirubin: 0.5 mg/dL (ref 0.3–1.2)
Total Protein: 5.7 g/dL — ABNORMAL LOW (ref 6.5–8.1)

## 2019-10-02 LAB — CBC WITH DIFFERENTIAL/PLATELET
Abs Immature Granulocytes: 0.04 10*3/uL (ref 0.00–0.07)
Basophils Absolute: 0 10*3/uL (ref 0.0–0.1)
Basophils Relative: 0 %
Eosinophils Absolute: 0 10*3/uL (ref 0.0–0.5)
Eosinophils Relative: 0 %
HCT: 20.1 % — ABNORMAL LOW (ref 36.0–46.0)
Hemoglobin: 6 g/dL — CL (ref 12.0–15.0)
Immature Granulocytes: 1 %
Lymphocytes Relative: 6 %
Lymphs Abs: 0.4 10*3/uL — ABNORMAL LOW (ref 0.7–4.0)
MCH: 27.1 pg (ref 26.0–34.0)
MCHC: 29.9 g/dL — ABNORMAL LOW (ref 30.0–36.0)
MCV: 91 fL (ref 80.0–100.0)
Monocytes Absolute: 0.2 10*3/uL (ref 0.1–1.0)
Monocytes Relative: 4 %
Neutro Abs: 5.4 10*3/uL (ref 1.7–7.7)
Neutrophils Relative %: 89 %
Platelets: 195 10*3/uL (ref 150–400)
RBC: 2.21 MIL/uL — ABNORMAL LOW (ref 3.87–5.11)
RDW: 17.8 % — ABNORMAL HIGH (ref 11.5–15.5)
WBC: 6 10*3/uL (ref 4.0–10.5)
nRBC: 0.5 % — ABNORMAL HIGH (ref 0.0–0.2)

## 2019-10-02 LAB — PREPARE RBC (CROSSMATCH)

## 2019-10-02 LAB — CBC
HCT: 23.2 % — ABNORMAL LOW (ref 36.0–46.0)
Hemoglobin: 7.2 g/dL — ABNORMAL LOW (ref 12.0–15.0)
MCH: 26.8 pg (ref 26.0–34.0)
MCHC: 31 g/dL (ref 30.0–36.0)
MCV: 86.2 fL (ref 80.0–100.0)
Platelets: 180 10*3/uL (ref 150–400)
RBC: 2.69 MIL/uL — ABNORMAL LOW (ref 3.87–5.11)
RDW: 16 % — ABNORMAL HIGH (ref 11.5–15.5)
WBC: 7.3 10*3/uL (ref 4.0–10.5)
nRBC: 0.8 % — ABNORMAL HIGH (ref 0.0–0.2)

## 2019-10-02 LAB — LACTIC ACID, PLASMA
Lactic Acid, Venous: 5.4 mmol/L (ref 0.5–1.9)
Lactic Acid, Venous: 7.9 mmol/L (ref 0.5–1.9)

## 2019-10-02 LAB — MRSA PCR SCREENING: MRSA by PCR: NEGATIVE

## 2019-10-02 LAB — SARS CORONAVIRUS 2 (TAT 6-24 HRS): SARS Coronavirus 2: NEGATIVE

## 2019-10-02 MED ORDER — DILTIAZEM HCL-DEXTROSE 125-5 MG/125ML-% IV SOLN (PREMIX): 5.0000 mg/h | INTRAVENOUS | Status: DC

## 2019-10-02 MED ORDER — SODIUM CHLORIDE 0.9 % IV SOLN
10.0000 mL/h | Freq: Once | INTRAVENOUS | Status: AC
  Administered 2019-10-02: 01:00:00 10 mL/h via INTRAVENOUS

## 2019-10-02 MED ORDER — SODIUM CHLORIDE 0.9% IV SOLUTION
Freq: Once | INTRAVENOUS | Status: AC
  Administered 2019-10-02: via INTRAVENOUS

## 2019-10-02 MED ORDER — SODIUM CHLORIDE 0.9 % IV SOLN
INTRAVENOUS | Status: DC
  Administered 2019-10-02: 11:00:00 via INTRAVENOUS

## 2019-10-02 MED ORDER — SODIUM CHLORIDE 0.9 % IV SOLN
510.0000 mg | Freq: Once | INTRAVENOUS | Status: AC
  Administered 2019-10-02: 09:00:00 510 mg via INTRAVENOUS
  Filled 2019-10-02: qty 17

## 2019-10-02 MED ORDER — ALBUTEROL SULFATE HFA 108 (90 BASE) MCG/ACT IN AERS
2.0000 | INHALATION_SPRAY | Freq: Four times a day (QID) | RESPIRATORY_TRACT | Status: DC | PRN
  Filled 2019-10-02: qty 6.7

## 2019-10-02 MED ORDER — BISOPROLOL FUMARATE 5 MG PO TABS
2.5000 mg | ORAL_TABLET | Freq: Every day | ORAL | Status: DC
  Administered 2019-10-02 – 2019-10-04 (×2): 2.5 mg via ORAL
  Filled 2019-10-02: qty 0.5
  Filled 2019-10-02: qty 1
  Filled 2019-10-02: qty 0.5
  Filled 2019-10-02: qty 1
  Filled 2019-10-02: qty 0.5
  Filled 2019-10-02: qty 1

## 2019-10-02 MED ORDER — PANTOPRAZOLE SODIUM 40 MG IV SOLR
40.0000 mg | Freq: Two times a day (BID) | INTRAVENOUS | Status: DC
  Administered 2019-10-02 – 2019-10-04 (×4): 40 mg via INTRAVENOUS
  Filled 2019-10-02 (×5): qty 40

## 2019-10-02 MED ORDER — ACETAMINOPHEN 500 MG PO TABS
500.0000 mg | ORAL_TABLET | Freq: Three times a day (TID) | ORAL | Status: DC | PRN
  Administered 2019-10-04: 09:00:00 500 mg via ORAL
  Filled 2019-10-02: qty 1

## 2019-10-02 MED ORDER — ONDANSETRON HCL 4 MG PO TABS: 4.0000 mg | ORAL_TABLET | Freq: Four times a day (QID) | ORAL | Status: DC | PRN

## 2019-10-02 MED ORDER — ONDANSETRON HCL 4 MG/2ML IJ SOLN: 4.0000 mg | Freq: Four times a day (QID) | INTRAMUSCULAR | Status: DC | PRN

## 2019-10-02 MED ORDER — ESCITALOPRAM OXALATE 10 MG PO TABS
20.0000 mg | ORAL_TABLET | Freq: Every day | ORAL | Status: DC
  Administered 2019-10-02 – 2019-10-03 (×2): 20 mg via ORAL
  Filled 2019-10-02 (×2): qty 2

## 2019-10-02 MED ORDER — ALPRAZOLAM 0.5 MG PO TABS
0.5000 mg | ORAL_TABLET | Freq: Two times a day (BID) | ORAL | Status: DC | PRN
  Administered 2019-10-02 – 2019-10-04 (×3): 0.5 mg via ORAL
  Filled 2019-10-02 (×4): qty 1

## 2019-10-02 NOTE — Progress Notes (Signed)
Page sent to Dr. Laural Golden, GI, consulting physician.

## 2019-10-02 NOTE — ED Notes (Signed)
Date and time results received: 10/02/19 0204  Test: hgb Critical Value: 6.0  Name of Provider Notified: [facility]  Orders Received? Or Actions Taken?: na

## 2019-10-02 NOTE — Progress Notes (Signed)
Patient seen and examined. Admitted after midnight secondary to symptomatic anemia. Patient with hx of A. Fib chronically on anticoagulation. Presented to ED with generalized weakness and found with Hgb of 6.o; this appears to be acute on chronic GIB and based on most recent inconclusive work up most likely small bowel AVM's. Please refer to H&P written by Dr. Darrick Meigs for further info/details on admission. Patient remains hemodynamically stable.  Plan: -transfuse PRBC's -follow GI recommendations; possible capsule endoscopy. -follow VS and continue holding BP meds for now.  Barton Dubois MD [phone]

## 2019-10-02 NOTE — ED Notes (Signed)
Date and time results received: 10/02/19 0217 (use smartphrase ".now" to insert current time)  Test: Lactic Acid+ Critical Value: 5.4  Name of Provider Notified: [facility]  Orders Received? Or Actions Taken?:

## 2019-10-02 NOTE — Consult Note (Signed)
Referring Provider: Barton Dubois, MD Primary Care Physician:  Baruch Gouty, FNP Primary Gastroenterologist:  Dr. Laural Golden  Reason for Consultation:   Anemia and GI bleed  HPI:   Patient is 78 year old Caucasian female who has recurrent anemia secondary to GI bleed with extensive negative work-up who presented to emergency room yesterday because of nausea vomiting and weakness.  She was noted to have a hemoglobin of 6 g.  Patient's hemoglobin on 08/27/2019 was 9.6.  Patient has received 3 units of PRBCs and also received single infusion of ferumoxytol. This afternoon patient has no complaints.  She is very upset that she has not been able to eat.  She has not had any nausea vomiting today.  She denies melena or rectal bleeding vaginal bleeding or hematuria.  She remains on apixaban.  She recalls that bleeding started since she has been on anticoagulant.   Patient been hospitalized multiple times with profound anemia and heme positive stools and she has undergone work-up which is summarized below. She was admitted for same about 5 weeks ago.  She was seen by Dr. [facility].  He recommended endoscopic placement of given capsule but patient declined.  She previously had refused to have capsule placed in small bowel since capsule never left the stomach when she had her first study.  She was scheduled for EGD 2 weeks ago but she called and canceled. She states she does not take OTC NSAIDs.  She denies abdominal pain.  She says lately her bowels have been regular she has a history of urgency and diarrhea as well as constipation in the past. She lives at home with her husband.  She does not smoke cigarettes or drink alcohol. She had 1 son who died 2 years ago.  He had multiple health problems and he was on too many pain medications.  She has 3 children and 3 grandchildren that she is able to spend time with.   Patient's recent GI work-up is summarized as below.  EGD 07/12/2019  small sliding  hiatal hernia, portal gastropathy, minimal scalloping to the duodenal mucosa but biopsy revealed resolution of changes.  Colonoscopy 06/22/2019 left-sided diverticulosis, internal/external hemorrhoids.  Given capsule study on 07/29/2019 Capsule never left the stomach.  CTA abdomen and pelvis on 07/30/2019. No evidence of active bleeding.  Atherosclerotic changes to celiac trunk with less than 50% stenosis and dense calcification to origin of SMA concerning for high-grade stenosis.   GI bleeding scan on 08/12/2019 Negative.  Small bowel Givens capsule study dated 08-20-19 This study was performed on an outpatient basis and once again was incomplete. I have recommended endoscopic placement of given capsule but she declines.     Past Medical History:  Diagnosis Date  . Anxiety    takes Xanax daily  . Arthritis    back  . Atrial fibrillation with RVR ([place]) 03/25/2019  . Cataract   . Chronic back pain   . Constipation    OTC stool softener prn  . COPD (chronic obstructive pulmonary disease) ([place])   . Depression   . Diverticulosis   . GERD (gastroesophageal reflux disease)    takes Ranidine daily  . Hemorrhoids   . History of bronchitis   . History of migraine    many yrs ago  . History of shingles   . Hyperlipidemia   . Insomnia   . Migraines   . Osteoporosis   . PONV (postoperative nausea and vomiting)     Past Surgical History:  Procedure Laterality  Date  . ABDOMINAL HYSTERECTOMY    . BIOPSY  07/12/2019   Procedure: BIOPSY;  Surgeon: Rogene Houston, MD;  [facility];  Service: Endoscopy;;  duodenum  . COLONOSCOPY    . COLONOSCOPY N/A 02/17/2015   Procedure: COLONOSCOPY;  Surgeon: Rogene Houston, MD;  [facility];  Service: Endoscopy;  Laterality: N/A;  110n - moved to 11:15 - Ann to notify pt  . COLONOSCOPY N/A 07/12/2019   Procedure: COLONOSCOPY;  Surgeon: Rogene Houston, MD;  [facility];  Service: Endoscopy;  Laterality: N/A;   . ESOPHAGOGASTRODUODENOSCOPY    . ESOPHAGOGASTRODUODENOSCOPY N/A 01/29/2016   Procedure: ESOPHAGOGASTRODUODENOSCOPY (EGD);  Surgeon: Rogene Houston, MD;  [facility];  Service: Endoscopy;  Laterality: N/A;  . ESOPHAGOGASTRODUODENOSCOPY N/A 10/14/2017   Procedure: ESOPHAGOGASTRODUODENOSCOPY (EGD);  Surgeon: Rogene Houston, MD;  [facility];  Service: Endoscopy;  Laterality: N/A;  730  . ESOPHAGOGASTRODUODENOSCOPY N/A 07/12/2019   Procedure: ESOPHAGOGASTRODUODENOSCOPY (EGD);  Surgeon: Rogene Houston, MD;  [facility];  Service: Endoscopy;  Laterality: N/A;  . EYE SURGERY Right 3/15   cataracts  . GIVENS CAPSULE STUDY N/A 07/29/2019   Procedure: GIVENS CAPSULE STUDY;  Surgeon: Rogene Houston, MD;  [facility];  Service: Endoscopy;  Laterality: N/A;  . GIVENS CAPSULE STUDY N/A 08/20/2019   Procedure: GIVENS CAPSULE STUDY;  Surgeon: Rogene Houston, MD;  [facility];  Service: Endoscopy;  Laterality: N/A;  730am  . INTRAMEDULLARY (IM) NAIL INTERTROCHANTERIC Left 01/07/2019   Procedure: INTRAMEDULLARY (IM) NAIL INTERTROCHANTRIC;  Surgeon: Carole Civil, MD;  Location: [facility];  Service: Orthopedics;  Laterality: Left;  . KYPHOPLASTY N/A 07/08/2014   Procedure: Thoracic Eleven Kyphoplasty;  Surgeon: Hosie Spangle, MD;  Location: [facility];  Service: Neurosurgery;  Laterality: N/A;  . left hip surgery    . LUMBAR LAMINECTOMY/DECOMPRESSION MICRODISCECTOMY Bilateral 05/20/2013   Procedure: LUMBAR LAMINECTOMY/DECOMPRESSION MICRODISCECTOMY 1 LEVEL;  Surgeon: Hosie Spangle, MD;  Location: [place] [facility];  Service: Neurosurgery;  Laterality: Bilateral;  Bilateral Lumbar four-five laminotomy and left lumbar four-five microdiskectomy  . RIGHT/LEFT HEART CATH AND CORONARY ANGIOGRAPHY N/A 03/31/2019   Procedure: RIGHT/LEFT HEART CATH AND CORONARY ANGIOGRAPHY;  Surgeon: Sherren Mocha, MD;  Location: [facility];  Service:  Cardiovascular;  Laterality: N/A;  . SPINE SURGERY     Dr Carloyn Manner -  vertebraplasty    Prior to Admission medications   Medication Sig Start Date End Date Taking? Authorizing Provider  albuterol (PROVENTIL HFA;VENTOLIN HFA) 108 (90 Base) MCG/ACT inhaler Inhale 2 puffs into the lungs every 6 (six) hours as needed for wheezing or shortness of breath (cough). 01/28/19  Yes Gerlene Fee, NP  ALPRAZolam Duanne Moron) 0.5 MG tablet Take 0.5 mg by mouth 2 (two) times daily as needed. 09/20/19  Yes [provider]  apixaban (ELIQUIS) 5 MG TABS tablet Take 1 tablet (5 mg total) by mouth 2 (two) times daily. Restart on 08/16/19 08/16/19  Yes Tat, Shanon Brow, MD  bisoprolol (ZEBETA) 5 MG tablet Take 0.5 tablets (2.5 mg total) by mouth daily. 08/04/19  Yes Minus Breeding, MD  cholecalciferol (VITAMIN D) 1000 UNITS tablet Take 2,000 Units by mouth daily.    Yes [provider]  escitalopram (LEXAPRO) 20 MG tablet TAKE ONE TABLET AT BEDTIME Patient taking differently: Take 20 mg by mouth at bedtime.  06/24/19  Yes Bricelyn Freestone, Mechele Dawley, MD  furosemide (LASIX) 20 MG tablet Take  1 tablet (20 mg total) by mouth daily. 04/07/19  Yes Minus Breeding, MD  metoCLOPramide (REGLAN) 5 MG tablet Take 1 tablet (5 mg total) by mouth 3 (three) times daily before meals. 08/22/19  Yes Allysson Rinehimer, Mechele Dawley, MD  Multiple Vitamin (MULTIVITAMIN WITH MINERALS) TABS tablet Take 1 tablet by mouth daily. Centrum Silver   Yes [provider]  pantoprazole (PROTONIX) 40 MG tablet Take 1 tablet (40 mg total) by mouth 2 (two) timesdaily before a meal. Patient taking differently: Take 40 mg by mouth 2 (two) times daily.  05/24/19  Yes Tzipporah Nagorski, Mechele Dawley, MD  potassium chloride SA (K-DUR) 20 MEQ tablet Take 1 tablet (20 mEq total) by mouth daily for 30 days. 04/19/19 10/02/19 Yes Rakes, Connye Burkitt, FNP  vitamin B-12 (CYANOCOBALAMIN) 1000 MCG tablet Take 1,000 mcg by mouth daily.   Yes [provider]  acetaminophen (TYLENOL) 500 MG tablet  Take 500 mg by mouth every 8 (eight) hours as needed for mild pain, moderate pain or headache.  01/20/19   [provider]    Current Facility-Administered Medications  Medication Dose Route Frequency Provider Last Rate Last Dose  . 0.9 %  sodium chloride infusion   Intravenous Continuous Oswald Hillock, MD 10 mL/hr at 10/02/19 1053    . acetaminophen (TYLENOL) tablet 500 mg  500 mg Oral Q8H PRN Darrick Meigs, Marge Duncans, MD      . albuterol (VENTOLIN HFA) 108 (90 Base) MCG/ACT inhaler 2 puff  2 puff Inhalation Q6H PRN Darrick Meigs, Marge Duncans, MD      . bisoprolol (ZEBETA) tablet 2.5 mg  2.5 mg Oral Daily Oswald Hillock, MD   2.5 mg at 10/02/19 1049  . ondansetron (ZOFRAN) tablet 4 mg  4 mg Oral Q6H PRN Oswald Hillock, MD       Or  . ondansetron (ZOFRAN) injection 4 mg  4 mg Intravenous Q6H PRN Oswald Hillock, MD      . pantoprazole (PROTONIX) injection 40 mg  40 mg Intravenous Q12H Oswald Hillock, MD   40 mg at 10/02/19 2229    Allergies as of 10/01/2019 - Review Complete 08/26/2019  Allergen Reaction Noted  . Codeine Nausea And Vomiting 04/08/2013  . Tramadol Nausea And Vomiting 10/07/2017  . Asa [aspirin] Other (See Comments) 04/08/2013  . Azithromycin Other (See Comments) 04/08/2013  . Celebrex [celecoxib] Other (See Comments) 04/08/2013  . Cymbalta [duloxetine hcl] Other (See Comments) 04/08/2013  . Prednisone Rash 03/25/2013  . Vioxx [rofecoxib] Other (See Comments) 04/08/2013  . Zelnorm [tegaserod] Other (See Comments) 04/08/2013  . Zocor [simvastatin] Other (See Comments) 04/08/2013    Family History  Problem Relation Age of Onset  . Hypertension Mother   . Heart attack Mother   . Macular degeneration Father   . Heart disease Father   . Cirrhosis Son   . Heart disease Son   . Colon cancer Neg Hx     Social History   Socioeconomic History  . Marital status: Married    Spouse name: Sterling Big   . Number of children: Not on file  . Years of education: Not on file  . Highest education  level: Not on file  Occupational History  . Occupation: retired     Comment: [employer] work - came out in [employer]  . Financial resource strain: Not hard at all  . Food insecurity    Worry: Never true    Inability: Never true  . Transportation needs  Medical: No    Non-medical: No  Tobacco Use  . Smoking status: Current Some Day Smoker    Packs/day: 1.00    Years: 52.00    Pack years: 52.00    Types: Cigarettes    Start date: 11/18/1958    Last attempt to quit: 08/20/2011    Years since quitting: 8.1  . Smokeless tobacco: Never Used  . Tobacco comment: 1/2-1pack off and on all her life  Substance and Sexual Activity  . Alcohol use: No    Alcohol/week: 0.0 standard drinks  . Drug use: No  . Sexual activity: Not Currently  Lifestyle  . Physical activity    Days per week: Patient refused    Minutes per session: Patient refused  . Stress: Only a little  Relationships  . Social connections    Talks on phone: More than three times a week    Gets together: Once a week    Attends religious service: Patient refused    Active member of club or organization: Patient refused    Attends meetings of clubs or organizations: Patient refused    Relationship status: Married  . Intimate partner violence    Fear of current or ex partner: No    Emotionally abused: No    Physically abused: No    Forced sexual activity: No  Other Topics Concern  . Not on file  Social History Narrative   Lives at home with husband, Sterling Big. Had one son- now deceased     Review of Systems: See HPI, otherwise normal ROS  Physical Exam: Temp:  [97.5 F (36.4 C)-98.9 F (37.2 C)] 98.6 F (37 C) (11/14 0834) Pulse Rate:  [70-94] 80 (11/14 1000) Resp:  [14-28] 18 (11/14 1000) BP: (98-143)/(47-82) 128/72 (11/14 1000) SpO2:  [94 %-100 %] 94 % (11/14 1000) Weight:  [53.1 kg-56 kg] 56 kg (11/14 0813) Last BM Date: 09/30/19  Patient is alert and in no acute distress. She appears  pale. Conjunctiva is also pale.  Sclerae nonicteric. Oropharyngeal mucosa is normal.  She has upper dental plate and her own teeth and lower jaw. Neck without masses or thyromegaly. Cardiac exam with regular rhythm normal S1 and S2.  No murmur gallop noted. Auscultation of lungs revealed vesicular breath sounds bilaterally. Abdomen is symmetrical bowel sounds are normal.  On palpation is soft.  She has mild tenderness in right lower quadrant.  No organomegaly or masses. Extremities are thin but no clubbing or peripheral edema noted.  Lab Results: Recent Labs    10/02/19 0143 10/02/19 0645  WBC 6.0 7.3  HGB 6.0* 7.2*  HCT 20.1* 23.2*  PLT 195 180   BMET Recent Labs    10/01/19 2336  NA 135  K 4.5  CL 107  CO2 15*  GLUCOSE 145*  BUN 34*  CREATININE 1.33*  CALCIUM 7.9*   LFT Recent Labs    10/01/19 2336  PROT 5.7*  ALBUMIN 3.0*  AST 19  ALT 13  ALKPHOS 83  BILITOT 0.5    Assessment;  Patient is 78 year old Caucasian female who presents with another drop in her hemoglobin.  Patient is anticoagulated because of history of atrial fibrillation.  No melena or rectal bleeding reported that her stool has been + for occult blood on prior admissions.  She has undergone multiple studies in the last 4 months and bleeding source has not been identified.  Given capsule study on 2 different occasions was incomplete as capsule never left the stomach because she has gastroparesis.  She has undergone multiple studies reviewed under history of present illness and these have been negative except given capsule study x2 have been incomplete. I suspect source of GI blood loss is small bowel angiodysplasia. Patient in the past has been reluctant to undergo endoscopic placement of given capsule but now she is agreeable.   Recommendations;  Esophagogastroduodenoscopy with endoscopic placement of given capsule into duodenum so that GI work-up as to source of GI bleed could be completed.     LOS: 0 days   Riata Ikeda  10/02/2019, 2:02 PM

## 2019-10-02 NOTE — ED Notes (Signed)
Date and time results received: 10/02/19 0007 (use smartphrase ".now" to insert current time)  Test: Hgb Critical Value: unreadable  Name of Provider Notified: [facility]  Orders Received? Or Actions Taken?:

## 2019-10-02 NOTE — ED Provider Notes (Signed)
Emergency Department Provider Note   I have reviewed the triage vital signs and the nursing notes.   HISTORY  Chief Complaint Emesis   HPI Misty [name] is a 78 y.o. female with medical problems document below who presents the emergency department today with weakness.  Chief complaint says vomiting but she states she is only been nauseous and vomited once but really what is been going on she had dark tarry stools for few days and then yesterday got significantly weak and progressively worsened so she presented here for further evaluation.  No fevers.  No abdominal pain.  No back pain, urinary changes, respiratory symptoms or other infectious symptoms.   It does appear on review of the records that she has had multiple studies to locate sources of bleeding and found to have inflammation in different areas via endoscopy, colonoscopy and capsule endoscopies.  No other associated or modifying symptoms.    Past Medical History:  Diagnosis Date  . Anxiety    takes Xanax daily  . Arthritis    back  . Atrial fibrillation with RVR ([place]) 03/25/2019  . Cataract   . Chronic back pain   . Constipation    OTC stool softener prn  . COPD (chronic obstructive pulmonary disease) ([place])   . Depression   . Diverticulosis   . GERD (gastroesophageal reflux disease)    takes Ranidine daily  . Hemorrhoids   . History of bronchitis   . History of migraine    many yrs ago  . History of shingles   . Hyperlipidemia   . Insomnia   . Migraines   . Osteoporosis   . PONV (postoperative nausea and vomiting)     Patient Active Problem List   Diagnosis Date Noted  . Acute on chronic blood loss anemia 08/26/2019  . Cardiomyopathy ([place]) 08/17/2019  . Nonrheumatic mitral valve regurgitation 08/17/2019  . Gastrointestinal hemorrhage 08/16/2019  . Chronic systolic CHF (congestive heart failure) ([place]) 08/11/2019  . Acute anemia 08/10/2019  . GI bleed 07/28/2019  . Gastroesophageal reflux disease  with esophagitis 07/20/2019  . Paroxysmal atrial fibrillation ([place]) 07/20/2019  . Anemia 07/10/2019  . Aortic atherosclerosis ([place]) 07/09/2019  . Vitamin D deficiency 07/09/2019  . GAD (generalized anxiety disorder) 07/09/2019  . Depression, recurrent ([place]) 07/09/2019  . Controlled substance agreement signed 07/09/2019  . Educated about COVID-19 virus infection 04/07/2019  . S/P thoracentesis   . Pleural effusion, right   . Acute systolic heart failure ([place]) 03/31/2019  . Lobar pneumonia ( [place])   . Acute hypoxemic respiratory failure ([place])   . Community acquired pneumonia 03/26/2019  . Elevated troponin 03/25/2019  . S/P IM nail left hip 01/07/2019 01/26/2019  . H/O kyphoplasty 01/14/2019  . Iron deficiency anemia 01/14/2019  . Acute blood loss anemia 01/13/2019  . Constipation due to opioid therapy 01/13/2019  . Anxiety and depression 01/13/2019  . Hypokalemia 01/10/2019  . Displaced intertrochanteric fracture of left femur, sequela 01/06/2019  . Chronic midline low back pain without sciatica 10/09/2018  . Chronic midline thoracic back pain 10/09/2018  . Osteopenia determined by x-ray 04/16/2018  . Pulmonary fibrosis ([place]) 04/07/2018  . Non-intractable vomiting with nausea 10/06/2017  . Malnutrition of moderate degree 01/29/2016  . Symptomatic anemia 01/27/2016  . Generalized weakness 01/27/2016  . Abnormal chest x-ray 01/27/2016  . Tobacco use disorder 01/27/2016  . Colitis   . Hematochezia 02/26/2015  . Ischemic colitis ([place]) 02/26/2015  . Orthostatic hypotension 02/26/2015  . Nontraumatic compression fracture of T11  vertebra ([place]) 07/08/2014  . Hyperlipidemia 08/19/2013  . Fibrocystic breast disease 08/19/2013  . History of migraine headaches 08/19/2013  . GERD (gastroesophageal reflux disease) 04/07/2013  . Osteoporosis, postmenopausal 04/07/2013    Past Surgical History:  Procedure Laterality Date  . ABDOMINAL HYSTERECTOMY    . BIOPSY  07/12/2019   Procedure:  BIOPSY;  Surgeon: Rogene Houston, MD;  [facility];  Service: Endoscopy;;  duodenum  . COLONOSCOPY    . COLONOSCOPY N/A 02/17/2015   Procedure: COLONOSCOPY;  Surgeon: Rogene Houston, MD;  [facility];  Service: Endoscopy;  Laterality: N/A;  110n - moved to 11:15 - Ann to notify pt  . COLONOSCOPY N/A 07/12/2019   Procedure: COLONOSCOPY;  Surgeon: Rogene Houston, MD;  [facility];  Service: Endoscopy;  Laterality: N/A;  . ESOPHAGOGASTRODUODENOSCOPY    . ESOPHAGOGASTRODUODENOSCOPY N/A 01/29/2016   Procedure: ESOPHAGOGASTRODUODENOSCOPY (EGD);  Surgeon: Rogene Houston, MD;  [facility];  Service: Endoscopy;  Laterality: N/A;  . ESOPHAGOGASTRODUODENOSCOPY N/A 10/14/2017   Procedure: ESOPHAGOGASTRODUODENOSCOPY (EGD);  Surgeon: Rogene Houston, MD;  [facility];  Service: Endoscopy;  Laterality: N/A;  730  . ESOPHAGOGASTRODUODENOSCOPY N/A 07/12/2019   Procedure: ESOPHAGOGASTRODUODENOSCOPY (EGD);  Surgeon: Rogene Houston, MD;  [facility];  Service: Endoscopy;  Laterality: N/A;  . EYE SURGERY Right 3/15   cataracts  . GIVENS CAPSULE STUDY N/A 07/29/2019   Procedure: GIVENS CAPSULE STUDY;  Surgeon: Rogene Houston, MD;  [facility];  Service: Endoscopy;  Laterality: N/A;  . GIVENS CAPSULE STUDY N/A 08/20/2019   Procedure: GIVENS CAPSULE STUDY;  Surgeon: Rogene Houston, MD;  [facility];  Service: Endoscopy;  Laterality: N/A;  730am  . INTRAMEDULLARY (IM) NAIL INTERTROCHANTERIC Left 01/07/2019   Procedure: INTRAMEDULLARY (IM) NAIL INTERTROCHANTRIC;  Surgeon: Carole Civil, MD;  Location: [facility];  Service: Orthopedics;  Laterality: Left;  . KYPHOPLASTY N/A 07/08/2014   Procedure: Thoracic Eleven Kyphoplasty;  Surgeon: Hosie Spangle, MD;  Location: [facility];  Service: Neurosurgery;  Laterality: N/A;  . left hip surgery    . LUMBAR LAMINECTOMY/DECOMPRESSION MICRODISCECTOMY Bilateral 05/20/2013    Procedure: LUMBAR LAMINECTOMY/DECOMPRESSION MICRODISCECTOMY 1 LEVEL;  Surgeon: Hosie Spangle, MD;  Location: [facility];  Service: Neurosurgery;  Laterality: Bilateral;  Bilateral Lumbar four-five laminotomy and left lumbar four-five microdiskectomy  . RIGHT/LEFT HEART CATH AND CORONARY ANGIOGRAPHY N/A 03/31/2019   Procedure: RIGHT/LEFT HEART CATH AND CORONARY ANGIOGRAPHY;  Surgeon: Sherren Mocha, MD;  Location: [facility];  Service: Cardiovascular;  Laterality: N/A;  . SPINE SURGERY     Dr Carloyn Manner -  vertebraplasty    Current Outpatient Rx  . Order #: [id number] Class: Historical Med  . Order #: [id number] Class: Normal  . Order #: [id number] Class: No Print  . Order #: [id number] Class: Fill Later  . Order #: [id number] Class: Historical Med  . Order #: [id number] Class: Normal  . Order #: [id number] Class: Normal  . Order #: [id number] Class: Normal  . Order #: [id number] Class: Historical Med  . Order #: [id number] Class: Normal  . Reflex Order#: [id number] (Ord#:[id number])Class: Normal  . Order #: [id number] Class: Historical Med    Allergies Codeine, Tramadol, Asa [aspirin], Azithromycin, Celebrex [celecoxib], Cymbalta [duloxetine hcl], Prednisone, Vioxx [rofecoxib], Zelnorm [tegaserod], and Zocor [simvastatin]  Family History  Problem Relation Age of Onset  . Hypertension Mother   . Heart attack Mother   . Macular degeneration Father   . Heart disease Father   .  Cirrhosis Son   . Heart disease Son   . Colon cancer Neg Hx     Social History Social History   Tobacco Use  . Smoking status: Current Some Day Smoker    Packs/day: 1.00    Years: 52.00    Pack years: 52.00    Types: Cigarettes    Start date: 11/18/1958    Last attempt to quit: 08/20/2011    Years since quitting: 8.1  . Smokeless tobacco: Never Used  . Tobacco comment: 1/2-1pack off and on all her life  Substance Use Topics  . Alcohol use: No    Alcohol/week: 0.0 standard drinks  . Drug use: No    Review of  Systems  All other systems negative except as documented in the HPI. All pertinent positives and negatives as reviewed in the HPI. ____________________________________________   PHYSICAL EXAM:  VITAL SIGNS: ED Triage Vitals  Enc Vitals Group     BP 10/01/19 2300 (!) 105/51     Pulse Rate 10/01/19 2300 93     Resp 10/01/19 2300 (!) 25     Temp 10/01/19 2301 98.9 F (37.2 C)     Temp Source 10/01/19 2301 Oral     SpO2 10/01/19 2300 99 %     Weight 10/01/19 2259 117 lb (53.1 kg)     Height 10/01/19 2259 5' 7"  (1.702 m)    Constitutional: Alert and oriented. Well appearing and in no acute distress. Eyes: Conjunctival pallor. PERRL. EOMI. Head: Atraumatic. Nose: No congestion/rhinnorhea. Mouth/Throat: Mucous membranes are moist.  Oropharynx non-erythematous. Neck: No stridor.  No meningeal signs.   Cardiovascular: Normal rate, regular rhythm. Good peripheral circulation. Grossly normal heart sounds.   Respiratory: Normal respiratory effort.  No retractions. Lungs CTAB. Gastrointestinal: Soft and nontender. No distention.  Musculoskeletal: No lower extremity tenderness nor edema. No gross deformities of extremities. Neurologic:  Normal speech and language. No gross focal neurologic deficits are appreciated.  Skin: Very pale.  ____________________________________________   LABS (all labs ordered are listed, but only abnormal results are displayed)  Labs Reviewed  COMPREHENSIVE METABOLIC PANEL - Abnormal; Notable for the following components:      Result Value   CO2 15 (*)    Glucose, Bld 145 (*)    BUN 34 (*)    Creatinine, Ser 1.33 (*)    Calcium 7.9 (*)    Total Protein 5.7 (*)    Albumin 3.0 (*)    GFR calc non Af Amer 38 (*)    GFR calc Af Amer 44 (*)    All other components within normal limits  LACTIC ACID, PLASMA - Abnormal; Notable for the following components:   Lactic Acid, Venous 7.9 (*)    All other components within normal limits  LACTIC ACID, PLASMA -  Abnormal; Notable for the following components:   Lactic Acid, Venous 5.4 (*)    All other components within normal limits  CBC WITH DIFFERENTIAL/PLATELET - Abnormal; Notable for the following components:   RBC 2.21 (*)    Hemoglobin 6.0 (*)    HCT 20.1 (*)    MCHC 29.9 (*)    RDW 17.8 (*)    nRBC 0.5 (*)    Lymphs Abs 0.4 (*)    All other components within normal limits  SARS CORONAVIRUS 2 (TAT 6-24 HRS)  CBC WITH DIFFERENTIAL/PLATELET  CBC  TYPE AND SCREEN  PREPARE RBC (CROSSMATCH)  PREPARE RBC (CROSSMATCH)   ____________________________________________  EKG   EKG Interpretation  Date/Time:  Friday  October 01 2019 23:01:40 EST Ventricular Rate:  95 PR Interval:    QRS Duration: 114 QT Interval:  404 QTC Calculation: 508 R Axis:   -6 Text Interpretation: Sinus rhythm Borderline intraventricular conduction delay Nonspecific repol abnormality, diffuse leads Prolonged QT interval No significant change since last tracing Confirmed by Merrily Pew [phone]) on 10/01/2019 11:14:40 PM      ____________________________________________  RADIOLOGY  No results found.  ____________________________________________   PROCEDURES  Procedure(s) performed:   .Critical Care Performed by: Merrily Pew, MD Authorized by: Merrily Pew, MD   Critical care provider statement:    Critical care time (minutes):  45   Critical care was necessary to treat or prevent imminent or life-threatening deterioration of the following conditions:  Circulatory failure   Critical care was time spent personally by me on the following activities:  Discussions with consultants, evaluation of patient's response to treatment, examination of patient, ordering and performing treatments and interventions, ordering and review of laboratory studies, ordering and review of radiographic studies, pulse oximetry, re-evaluation of patient's condition, obtaining history from patient or surrogate and review of old  charts   ____________________________________________   INITIAL IMPRESSION / [place] / ED COURSE  I suspect the patient is significantly anemic. Esp with history of same.  Labs, type and screen ordered.  Patient CBC had undetectable hemoglobin is a 2 units of untyped uncrossed blood work transfused emergently.  Her lactic acid is significantly elevated I think is prior related to the anemia more than anything I do not think she has infection at this time.  Of note she is still on Eliquis this is per discussion with her and her cardiologist that she prefer to continue it and rather risk of the bleeding than stroke.  After 2 units of onset bladder repeat hemoglobin 6.0 and her lactic acid is improved as well but still not normal.  I discussed with the hospitalist who will admit for further transfusion and management.  Pertinent labs & imaging results that were available during my care of the patient were reviewed by me and considered in my medical decision making (see chart for details). ____________________________________________  FINAL CLINICAL IMPRESSION(S) / ED DIAGNOSES  Final diagnoses:  Anemia, unspecified type    MEDICATIONS GIVEN DURING THIS VISIT:  Medications  acetaminophen (TYLENOL) tablet 500 mg (has no administration in time range)  albuterol (VENTOLIN HFA) 108 (90 Base) MCG/ACT inhaler 2 puff (has no administration in time range)  0.9 %  sodium chloride infusion (has no administration in time range)  ondansetron (ZOFRAN) tablet 4 mg (has no administration in time range)    Or  ondansetron (ZOFRAN) injection 4 mg (has no administration in time range)  pantoprazole (PROTONIX) injection 40 mg (40 mg Intravenous Not Given 10/02/19 0300)  lactated ringers bolus 1,000 mL (0 mLs Intravenous Stopped 10/02/19 0013)  0.9 %  sodium chloride infusion (Manually program via Guardrails IV Fluids) ( Intravenous Stopped 10/02/19 0055)  0.9 %  sodium chloride infusion (10  mL/hr Intravenous New Bag/Given 10/02/19 0103)     NEW OUTPATIENT MEDICATIONS STARTED DURING THIS VISIT:  New Prescriptions   No medications on file    Note:  This note was prepared with assistance of Dragon voice recognition software. Occasional wrong-word or sound-a-like substitutions may have occurred due to the inherent limitations of voice recognition software.   Merrily Pew, MD 10/02/19 [phone]

## 2019-10-02 NOTE — ED Notes (Signed)
Date and time results received: 10/02/19 0025  Test: Lactic Acid  Critical Value: 7.9  Name of Provider Notified: [place]  Orders Received? Or Actions Taken?: na

## 2019-10-02 NOTE — H&P (Signed)
TRH H&P    Patient Demographics:    Misty [name], is a 78 y.o. female  MRN: [id number]  DOB - [birth year]  Admit Date - 10/01/2019  Referring MD/NP/PA: Dorise Bullion  Outpatient Primary MD for the patient is Rakes, Connye Burkitt, FNP  Patient coming from: Home  Chief complaint-weakness   HPI:    Misty [name]  is a 78 y.o. female, with history of recurrent GI bleed, chronic blood loss anemia, paroxysmal atrial fibrillation on Eliquis, GERD, celiac disease, diverticulosis, anxiety, depression, systolic CHF, pulmonary fibrosis, ischemic colitis came to hospital with complaints of generalized weakness.  Patient has history of recurrent GI bleed and has been followed by gastroenterology as outpatient.  She was supposed to get a EGD with capsule endoscopy, and it was canceled by patient as she developed diarrhea on 09/13/2019.  She has history of erosive reflux esophagitis, portal gastropathy and multiple EGDs.  2 attempts at capsule endoscopy have been completed, capsule never left her stomach.  GI bleeding scan was negative. Today patient presenting with generalized weakness, in the ED hemoglobin was 6.0.  2 units PRBC have been ordered. She denies chest pain or shortness of breath. Denies fever or cough. Denies nausea vomiting or diarrhea.     Review of systems:    In addition to the HPI above,   All other systems reviewed and are negative.    Past History of the following :    Past Medical History:  Diagnosis Date  . Anxiety    takes Xanax daily  . Arthritis    back  . Atrial fibrillation with RVR ([place]) 03/25/2019  . Cataract   . Chronic back pain   . Constipation    OTC stool softener prn  . COPD (chronic obstructive pulmonary disease) ([place])   . Depression   . Diverticulosis   . GERD (gastroesophageal reflux disease)    takes Ranidine daily  . Hemorrhoids   . History of bronchitis   . History  of migraine    many yrs ago  . History of shingles   . Hyperlipidemia   . Insomnia   . Migraines   . Osteoporosis   . PONV (postoperative nausea and vomiting)       Past Surgical History:  Procedure Laterality Date  . ABDOMINAL HYSTERECTOMY    . BIOPSY  07/12/2019   Procedure: BIOPSY;  Surgeon: Rogene Houston, MD;  [facility];  Service: Endoscopy;;  duodenum  . COLONOSCOPY    . COLONOSCOPY N/A 02/17/2015   Procedure: COLONOSCOPY;  Surgeon: Rogene Houston, MD;  [facility];  Service: Endoscopy;  Laterality: N/A;  110n - moved to 11:15 - Ann to notify pt  . COLONOSCOPY N/A 07/12/2019   Procedure: COLONOSCOPY;  Surgeon: Rogene Houston, MD;  [facility];  Service: Endoscopy;  Laterality: N/A;  . ESOPHAGOGASTRODUODENOSCOPY    . ESOPHAGOGASTRODUODENOSCOPY N/A 01/29/2016   Procedure: ESOPHAGOGASTRODUODENOSCOPY (EGD);  Surgeon: Rogene Houston, MD;  [facility];  Service: Endoscopy;  Laterality: N/A;  .  ESOPHAGOGASTRODUODENOSCOPY N/A 10/14/2017   Procedure: ESOPHAGOGASTRODUODENOSCOPY (EGD);  Surgeon: Rogene Houston, MD;  [facility];  Service: Endoscopy;  Laterality: N/A;  730  . ESOPHAGOGASTRODUODENOSCOPY N/A 07/12/2019   Procedure: ESOPHAGOGASTRODUODENOSCOPY (EGD);  Surgeon: Rogene Houston, MD;  [facility];  Service: Endoscopy;  Laterality: N/A;  . EYE SURGERY Right 3/15   cataracts  . GIVENS CAPSULE STUDY N/A 07/29/2019   Procedure: GIVENS CAPSULE STUDY;  Surgeon: Rogene Houston, MD;  [facility];  Service: Endoscopy;  Laterality: N/A;  . GIVENS CAPSULE STUDY N/A 08/20/2019   Procedure: GIVENS CAPSULE STUDY;  Surgeon: Rogene Houston, MD;  [facility];  Service: Endoscopy;  Laterality: N/A;  730am  . INTRAMEDULLARY (IM) NAIL INTERTROCHANTERIC Left 01/07/2019   Procedure: INTRAMEDULLARY (IM) NAIL INTERTROCHANTRIC;  Surgeon: Carole Civil, MD;  Location: [facility];  Service: Orthopedics;   Laterality: Left;  . KYPHOPLASTY N/A 07/08/2014   Procedure: Thoracic Eleven Kyphoplasty;  Surgeon: Hosie Spangle, MD;  Location: [place];  Service: Neurosurgery;  Laterality: N/A;  . left hip surgery    . LUMBAR LAMINECTOMY/DECOMPRESSION MICRODISCECTOMY Bilateral 05/20/2013   Procedure: LUMBAR LAMINECTOMY/DECOMPRESSION MICRODISCECTOMY 1 LEVEL;  Surgeon: Hosie Spangle, MD;  Location: [place];  Service: Neurosurgery;  Laterality: Bilateral;  Bilateral Lumbar four-five laminotomy and left lumbar four-five microdiskectomy  . RIGHT/LEFT HEART CATH AND CORONARY ANGIOGRAPHY N/A 03/31/2019   Procedure: RIGHT/LEFT HEART CATH AND CORONARY ANGIOGRAPHY;  Surgeon: Sherren Mocha, MD;  Location: [facility];  Service: Cardiovascular;  Laterality: N/A;  . SPINE SURGERY     Dr Carloyn Manner -  vertebraplasty      Social History:      Social History   Tobacco Use  . Smoking status: Current Some Day Smoker    Packs/day: 1.00    Years: 52.00    Pack years: 52.00    Types: Cigarettes    Start date: 11/18/1958    Last attempt to quit: 08/20/2011    Years since quitting: 8.1  . Smokeless tobacco: Never Used  . Tobacco comment: 1/2-1pack off and on all her life  Substance Use Topics  . Alcohol use: No    Alcohol/week: 0.0 standard drinks       Family History :     Family History  Problem Relation Age of Onset  . Hypertension Mother   . Heart attack Mother   . Macular degeneration Father   . Heart disease Father   . Cirrhosis Son   . Heart disease Son   . Colon cancer Neg Hx       Home Medications:   Prior to Admission medications   Medication Sig Start Date End Date Taking? Authorizing Provider  acetaminophen (TYLENOL) 500 MG tablet Take 500 mg by mouth every 8 (eight) hours as needed for mild pain, moderate pain or headache.  01/20/19   [provider]  albuterol (PROVENTIL HFA;VENTOLIN HFA) 108 (90 Base) MCG/ACT inhaler Inhale 2 puffs into the lungs every 6 (six)  hours as needed for wheezing or shortness of breath (cough). 01/28/19   Gerlene Fee, NP  apixaban (ELIQUIS) 5 MG TABS tablet Take 1 tablet (5 mg total) by mouth 2 (two) times daily. Restart on 08/16/19 08/16/19   Orson Eva, MD  bisoprolol (ZEBETA) 5 MG tablet Take 0.5 tablets (2.5 mg total) by mouth daily. 08/04/19   Minus Breeding, MD  cholecalciferol (VITAMIN D) 1000 UNITS tablet Take 2,000 Units by mouth daily.  [provider]  escitalopram (LEXAPRO) 20 MG tablet TAKE ONE TABLET AT BEDTIME Patient taking differently: Take 20 mg by mouth at bedtime.  06/24/19   Rehman, Mechele Dawley, MD  furosemide (LASIX) 20 MG tablet Take 1 tablet (20 mg total) by mouth daily. 04/07/19   Minus Breeding, MD  metoCLOPramide (REGLAN) 5 MG tablet Take 1 tablet (5 mg total) by mouth 3 (three) times daily before meals. 08/22/19   Rogene Houston, MD  Multiple Vitamin (MULTIVITAMIN WITH MINERALS) TABS tablet Take 1 tablet by mouth daily. Centrum Silver    [provider]  pantoprazole (PROTONIX) 40 MG tablet Take 1 tablet (40 mg total) by mouth 2 (two) timesdaily before a meal. Patient taking differently: Take 40 mg by mouth 2 (two) times daily.  05/24/19   Rehman, Mechele Dawley, MD  potassium chloride SA (K-DUR) 20 MEQ tablet Take 1 tablet (20 mEq total) by mouth daily for 30 days. 04/19/19 08/26/19  Baruch Gouty, FNP  vitamin B-12 (CYANOCOBALAMIN) 1000 MCG tablet Take 1,000 mcg by mouth daily.    [provider]     Allergies:     Allergies  Allergen Reactions  . Codeine Nausea And Vomiting  . Tramadol Nausea And Vomiting  . Asa [Aspirin] Other (See Comments)    Patient is unaware of allergy  . Azithromycin Other (See Comments)    Patient is unaware of allergy  . Celebrex [Celecoxib] Other (See Comments)    Irritated stomach   . Cymbalta [Duloxetine Hcl] Other (See Comments)    Felt groggy  . Prednisone Rash    She has seen the podiatrist and he had injected cortisone in her feet and  she had also taken a peel at home. She had a severe reaction to her face with a rash and had to take Benadryl to resolve the symptom. She actually did get more cortisone shots, but no additional reactions to the shots.  . Vioxx [Rofecoxib] Other (See Comments)    Unknown  . Zelnorm [Tegaserod] Other (See Comments)    Patient is unaware of allergy  . Zocor [Simvastatin] Other (See Comments)    Patient is unaware of allergy     Physical Exam:   Vitals  Blood pressure (!) 110/55, pulse 81, temperature 97.8 F (36.6 C), temperature source Axillary, resp. rate (!) 28, height 5' 7"  (1.702 m), weight 53.1 kg, SpO2 94 %.  1.  General: Appears in no acute distress  2. Psychiatric: Alert, oriented x3, intact insight and judgment  3. Neurologic: Cranial nerves II through XII grossly intact, no focal deficit noted at this time  4. HEENMT:  Atraumatic normocephalic, extraocular muscles are intact  5. Respiratory : Clear to auscultation bilaterally  6. Cardiovascular : S1-S2, regular, no murmur auscultated  7. Gastrointestinal:  Abdominal soft, nontender, no organomegaly      Data Review:    CBC Recent Labs  Lab 10/02/19 0143  WBC 6.0  HGB 6.0*  HCT 20.1*  PLT 195  MCV 91.0  MCH 27.1  MCHC 29.9*  RDW 17.8*  LYMPHSABS 0.4*  MONOABS 0.2  EOSABS 0.0  BASOSABS 0.0   ------------------------------------------------------------------------------------------------------------------  Results for orders placed or performed during the hospital encounter of 10/01/19 (from the past 48 hour(s))  Type and screen     Status: None (Preliminary result)   Collection Time: 10/01/19 11:36 PM  Result Value Ref Range   ABO/RH(D) O POS    Antibody Screen NEG    Sample Expiration  10/04/2019,2359 Performed at [facility], [address], [place], [place] [address]    Unit Number [id number]    Blood Component Type RED CELLS,LR    Unit division 00    Status of Unit ISSUED     Unit tag comment EMERGENCY RELEASE    Transfusion Status OK TO TRANSFUSE    Crossmatch Result COMPATIBLE    Unit Number [id number]    Blood Component Type RED CELLS,LR    Unit division 00    Status of Unit ISSUED    Unit tag comment EMERGENCY RELEASE    Transfusion Status OK TO TRANSFUSE    Crossmatch Result COMPATIBLE    Unit Number [id number]    Blood Component Type RED CELLS,LR    Unit division 00    Status of Unit ALLOCATED    Transfusion Status OK TO TRANSFUSE    Crossmatch Result Compatible    Unit Number [id number]    Blood Component Type RED CELLS,LR    Unit division 00    Status of Unit ALLOCATED    Transfusion Status OK TO TRANSFUSE    Crossmatch Result Compatible   Comprehensive metabolic panel     Status: Abnormal   Collection Time: 10/01/19 11:36 PM  Result Value Ref Range   Sodium 135 135 - 145 mmol/L   Potassium 4.5 3.5 - 5.1 mmol/L   Chloride 107 98 - 111 mmol/L   CO2 15 (L) 22 - 32 mmol/L   Glucose, Bld 145 (H) 70 - 99 mg/dL   BUN 34 (H) 8 - 23 mg/dL   Creatinine, Ser 1.33 (H) 0.44 - 1.00 mg/dL   Calcium 7.9 (L) 8.9 - 10.3 mg/dL   Total Protein 5.7 (L) 6.5 - 8.1 g/dL   Albumin 3.0 (L) 3.5 - 5.0 g/dL   AST 19 15 - 41 U/L   ALT 13 0 - 44 U/L   Alkaline Phosphatase 83 38 - 126 U/L   Total Bilirubin 0.5 0.3 - 1.2 mg/dL   GFR calc non Af Amer 38 (L) >60 mL/min   GFR calc Af Amer 44 (L) >60 mL/min   Anion gap 13 5 - 15    Comment: Performed at [facility], [address], [place], [place] [address]  Lactic acid, plasma     Status: Abnormal   Collection Time: 10/01/19 11:36 PM  Result Value Ref Range   Lactic Acid, Venous 7.9 (HH) 0.5 - 1.9 mmol/L    Comment: CRITICAL RESULT CALLED TO, READ BACK BY AND VERIFIED WITH: ANDREWS,L @ 0025 ON 10/02/19 BY JUW Performed at [facility], [address], [place], [place] [address]   Prepare RBC     Status: None   Collection Time: 10/02/19 12:30 AM  Result Value Ref Range   Order Confirmation      ORDER  PROCESSED BY BLOOD BANK Performed at [facility], [address], [place], [place] [address]   Prepare RBC     Status: None   Collection Time: 10/02/19 12:52 AM  Result Value Ref Range   Order Confirmation      ORDER PROCESSED BY BLOOD BANK Performed at [facility], [address] [place], [place] [address]   Lactic acid, plasma     Status: Abnormal   Collection Time: 10/02/19  1:43 AM  Result Value Ref Range   Lactic Acid, Venous 5.4 (HH) 0.5 - 1.9 mmol/L    Comment: CRITICAL RESULT CALLED TO, READ BACK BY AND VERIFIED WITH: EASTER,T @ 0217 ON 10/02/19 BY JUW  Performed at [facility], [address], [place], [place] [address]   CBC with Differential     Status: Abnormal   Collection Time: 10/02/19  1:43 AM  Result Value Ref Range   WBC 6.0 4.0 - 10.5 K/uL   RBC 2.21 (L) 3.87 - 5.11 MIL/uL   Hemoglobin 6.0 (LL) 12.0 - 15.0 g/dL    Comment: POST TRANSFUSION SPECIMEN THIS CRITICAL RESULT HAS VERIFIED AND BEEN CALLED TO ANDREWS,L BY JAMIE WOODIE ON 11 14 2020 AT 0202, AND HAS BEEN READ BACK.     HCT 20.1 (L) 36.0 - 46.0 %   MCV 91.0 80.0 - 100.0 fL   MCH 27.1 26.0 - 34.0 pg   MCHC 29.9 (L) 30.0 - 36.0 g/dL   RDW 17.8 (H) 11.5 - 15.5 %   Platelets 195 150 - 400 K/uL   nRBC 0.5 (H) 0.0 - 0.2 %   Neutrophils Relative % 89 %   Neutro Abs 5.4 1.7 - 7.7 K/uL   Lymphocytes Relative 6 %   Lymphs Abs 0.4 (L) 0.7 - 4.0 K/uL   Monocytes Relative 4 %   Monocytes Absolute 0.2 0.1 - 1.0 K/uL   Eosinophils Relative 0 %   Eosinophils Absolute 0.0 0.0 - 0.5 K/uL   Basophils Relative 0 %   Basophils Absolute 0.0 0.0 - 0.1 K/uL   Immature Granulocytes 1 %   Abs Immature Granulocytes 0.04 0.00 - 0.07 K/uL    Comment: Performed at [facility], [address], [place], [place] [address]    Chemistries  Recent Labs  Lab 10/01/19 2336  NA 135  K 4.5  CL 107  CO2 15*  GLUCOSE 145*  BUN 34*  CREATININE 1.33*  CALCIUM 7.9*  AST 19  ALT 13  ALKPHOS 83  BILITOT 0.5    ------------------------------------------------------------------------------------------------------------------  ------------------------------------------------------------------------------------------------------------------ GFR: Estimated Creatinine Clearance: 29.2 mL/min (A) (by C-G formula based on SCr of 1.33 mg/dL (H)). Liver Function Tests: Recent Labs  Lab 10/01/19 2336  AST 19  ALT 13  ALKPHOS 83  BILITOT 0.5  PROT 5.7*  ALBUMIN 3.0*    --------------------------------------------------------------------------------------------------------------- Urine analysis:    Component Value Date/Time   COLORURINE YELLOW 01/14/2019 0226   APPEARANCEUR CLEAR 01/14/2019 0226   LABSPEC 1.012 01/14/2019 0226   PHURINE 7.0 01/14/2019 0226   GLUCOSEU NEGATIVE 01/14/2019 0226   HGBUR NEGATIVE 01/14/2019 0226   BILIRUBINUR NEGATIVE 01/14/2019 0226   KETONESUR NEGATIVE 01/14/2019 0226   PROTEINUR NEGATIVE 01/14/2019 0226   NITRITE NEGATIVE 01/14/2019 0226   LEUKOCYTESUR NEGATIVE 01/14/2019 0226      Imaging Results:    No results found.  My personal review of EKG: Rhythm NSR, QTC 508   Assessment & Plan:    Active Problems:   Anemia   1. Acute on chronic blood loss anemia-patient has had multiple admissions for recurrent bouts of blood loss anemia.  Although work-up done by gastroenterology has been inconclusive.  Today hemoglobin is 6.0.  Will transfuse 2 units PRBC.  We will keep her n.p.o.  Follow CBC in a.m.  Patient is hemodynamically stable.  Will consult gastroenterology in a.m.  Will start Protonix 40 mg IV every 12 hours.  2. Paroxysmal atrial fibrillation-we will hold bisoprolol and Eliquis at this time.  Consider restarting bisoprolol if patient's heart rate goes up.  3. Chronic systolic CHF-currently appears dehydrated, hold Lasix at this time.  4. History of pulmonary fibrosis-continue as needed albuterol.  5. Anxiety/depression-we will hold Lexapro at  this time.  Consider restarting if patient is started on diet.  Currently she is n.p.o.    DVT Prophylaxis-  SCDs  AM Labs Ordered, also please review Full Orders  Family Communication: Admission, patients condition and plan of care including tests being ordered have been discussed with the patient  who indicate understanding and agree with the plan and Code Status.  Code Status: Full code  Admission status: Inpatient: Based on patients clinical presentation and evaluation of above clinical data, I have made determination that patient meets Inpatient criteria at this time.  Time spent in minutes : 60 minutes   Oswald Hillock M.D on 10/02/2019 at 2:23 AM

## 2019-10-02 NOTE — ED Notes (Signed)
Date and time results received: 10/02/19 0025  Test: Lactic Acid Critical Value: 7.9  Name of Provider Notified: [place]  Orders Received? Or Actions Taken?:

## 2019-10-03 ENCOUNTER — Encounter (HOSPITAL_COMMUNITY)
Admission: EM | Disposition: A | Discharge status: Home / Self Care | Payer: Self-pay | Source: Home / Self Care | Attending: Internal Medicine

## 2019-10-03 DIAGNOSIS — I10 Essential (primary) hypertension: Secondary | ICD-10-CM

## 2019-10-03 DIAGNOSIS — I5022 Chronic systolic (congestive) heart failure: Secondary | ICD-10-CM

## 2019-10-03 DIAGNOSIS — K449 Diaphragmatic hernia without obstruction or gangrene: Secondary | ICD-10-CM

## 2019-10-03 DIAGNOSIS — F418 Other specified anxiety disorders: Secondary | ICD-10-CM

## 2019-10-03 DIAGNOSIS — K3189 Other diseases of stomach and duodenum: Secondary | ICD-10-CM

## 2019-10-03 DIAGNOSIS — K900 Celiac disease: Secondary | ICD-10-CM

## 2019-10-03 DIAGNOSIS — K31819 Angiodysplasia of stomach and duodenum without bleeding: Secondary | ICD-10-CM

## 2019-10-03 DIAGNOSIS — K766 Portal hypertension: Secondary | ICD-10-CM

## 2019-10-03 DIAGNOSIS — I480 Paroxysmal atrial fibrillation: Secondary | ICD-10-CM

## 2019-10-03 DIAGNOSIS — K229 Disease of esophagus, unspecified: Secondary | ICD-10-CM

## 2019-10-03 HISTORY — PX: ESOPHAGOGASTRODUODENOSCOPY: SHX5428

## 2019-10-03 HISTORY — PX: GIVENS CAPSULE STUDY: SHX5432

## 2019-10-03 LAB — CBC
HCT: 23.4 % — ABNORMAL LOW (ref 36.0–46.0)
Hemoglobin: 7.1 g/dL — ABNORMAL LOW (ref 12.0–15.0)
MCH: 27.2 pg (ref 26.0–34.0)
MCHC: 30.3 g/dL (ref 30.0–36.0)
MCV: 89.7 fL (ref 80.0–100.0)
Platelets: 154 10*3/uL (ref 150–400)
RBC: 2.61 MIL/uL — ABNORMAL LOW (ref 3.87–5.11)
RDW: 17 % — ABNORMAL HIGH (ref 11.5–15.5)
WBC: 6 10*3/uL (ref 4.0–10.5)
nRBC: 0.5 % — ABNORMAL HIGH (ref 0.0–0.2)

## 2019-10-03 LAB — KOH PREP

## 2019-10-03 SURGERY — EGD (ESOPHAGOGASTRODUODENOSCOPY)
Anesthesia: Moderate Sedation

## 2019-10-03 MED ORDER — MIDAZOLAM HCL 5 MG/5ML IJ SOLN
INTRAMUSCULAR | Status: AC
  Filled 2019-10-03: qty 10

## 2019-10-03 MED ORDER — MEPERIDINE HCL 50 MG/ML IJ SOLN
INTRAMUSCULAR | Status: DC | PRN
  Administered 2019-10-03 (×2): 25 mg via INTRAVENOUS

## 2019-10-03 MED ORDER — MIDAZOLAM HCL 5 MG/5ML IJ SOLN
INTRAMUSCULAR | Status: DC | PRN
  Administered 2019-10-03: 2 mg via INTRAVENOUS

## 2019-10-03 MED ORDER — MIDAZOLAM HCL 5 MG/5ML IJ SOLN
INTRAMUSCULAR | Status: DC | PRN
  Administered 2019-10-03 (×4): 2 mg via INTRAVENOUS

## 2019-10-03 MED ORDER — MEPERIDINE HCL 50 MG/ML IJ SOLN
INTRAMUSCULAR | Status: AC
  Filled 2019-10-03: qty 1

## 2019-10-03 MED ORDER — NYSTATIN 100000 UNIT/ML MT SUSP
5.0000 mL | Freq: Four times a day (QID) | OROMUCOSAL | Status: DC
  Administered 2019-10-04 (×2): 500000 [IU] via ORAL
  Filled 2019-10-03 (×6): qty 5

## 2019-10-03 MED ORDER — METOCLOPRAMIDE HCL 5 MG/ML IJ SOLN
10.0000 mg | Freq: Once | INTRAMUSCULAR | Status: DC
  Filled 2019-10-03: qty 2

## 2019-10-03 MED ORDER — LIDOCAINE VISCOUS HCL 2 % MT SOLN
OROMUCOSAL | Status: AC
  Filled 2019-10-03: qty 15

## 2019-10-03 MED ORDER — SODIUM CHLORIDE FLUSH 0.9 % IV SOLN
INTRAVENOUS | Status: AC
  Administered 2019-10-03: 22:00:00 10 mL
  Filled 2019-10-03: qty 30

## 2019-10-03 NOTE — Progress Notes (Signed)
Brief EGD note.  Patchy exudate at proximal to mid esophagus suspicious for mild candidiasis. Rushing taken for KOH. Healed esophagitis. Small sliding hiatal hernia. Small amount of food debris in proximal stomach. Gastric mucosa at body and fundus with mild changes of PHG. Gastric antral vascular ectasia without stigmata of bleed. Scalloping in order to bulbar mucosa but post bulbar mucosa was normal. Given capsule placed endoscopically into second part of duodenum. Patient tolerated the procedure well.

## 2019-10-03 NOTE — Plan of Care (Signed)

## 2019-10-03 NOTE — Progress Notes (Signed)
PROGRESS NOTE    Misty [name]  GGY:[id number] DOB: 1941/05/26 DOA: 10/01/2019 PCP: Baruch Gouty, FNP     Brief Narrative:  78 y.o. female, with history of recurrent GI bleed, chronic blood loss anemia, paroxysmal atrial fibrillation on Eliquis, GERD, celiac disease, diverticulosis, anxiety, depression, systolic CHF, pulmonary fibrosis, ischemic colitis came to hospital with complaints of generalized weakness.  Patient has history of recurrent GI bleed and has been followed by gastroenterology as outpatient.  She was supposed to get a EGD with capsule endoscopy, and it was canceled by patient as she developed diarrhea on 09/13/2019.  She has history of erosive reflux esophagitis, portal gastropathy and multiple EGDs.  2 attempts at capsule endoscopy have been completed, capsule never left her stomach.  GI bleeding scan was negative. Today patient presenting with generalized weakness, in the ED hemoglobin was 6.0.  2 units PRBC have been ordered. She denies chest pain or shortness of breath. Denies fever or cough. Denies nausea vomiting or diarrhea.  Assessment & Plan: 1-acute on chronic blood loss anemia -Patient is status post endoscopy demonstrating GAVE, but no active bleeding -Capsule endoscopy in process. -Hemoglobin on presentation 6.0 -Status post blood transfusion with improvement in hemoglobin to 7.2. -Continue IV Protonix -Follow GI service recommendations.  2-paroxysmal atrial fibrillation -Continue holding Eliquis -Resume bisoprolol -Heart rate has remained stable and at this moment will be safe to discontinue telemetry.  3-chronic systolic heart failure -Will continue holding Lasix today -Follow daily weights and strict I's and O's.  4-history of pulmonary fibrosis -Continue as needed bronchodilators -No requiring oxygen supplementation -A stable breathing.  5-anxiety/depression -Stable mood -No suicidal ideation or hallucinations -Continue Lexapro and PRN  xanax.   DVT prophylaxis: SCDs Code Status: Full code Family Communication: No family at bedside. Disposition Plan: Remains inpatient, continue IV PPI; follow results of capsule endoscopy evaluation.  Endoscopy demonstrated GAVE, but no active bleeding at this moment.  Consultants:   GI service  Procedures:   See below for x-ray reports.  Endoscopy/capsule endoscopy.  Antimicrobials:  Anti-infectives (From admission, onward)   None       Subjective: No fever, no chest pain, no nausea, no vomiting.  Has tolerated well endoscopic procedure.  No frank bleeding appreciated.  Hemodynamically stable.    Objective: Vitals:   10/02/19 1923 10/02/19 2242 10/03/19 0545 10/03/19 1043  BP:  (!) 104/50 (!) 111/58 125/73  Pulse:  73 72 64  Resp:  20  16  Temp:  98.2 F (36.8 C) 98.5 F (36.9 C)   TempSrc:  Oral Oral   SpO2: 93% 96% 98% 96%  Weight:      Height:        Intake/Output Summary (Last 24 hours) at 10/03/2019 1223 Last data filed at 10/02/2019 2100 Gross per 24 hour  Intake 249.64 ml  Output 300 ml  Net -50.36 ml   Filed Weights   10/01/19 2259 10/02/19 0813  Weight: 53.1 kg 56 kg    Examination: General exam: Alert, awake, oriented x 3, feeling slightly better today.  No chest pain, no nausea, no vomiting.  Tolerated endoscopy well. Respiratory system: Clear to auscultation. Respiratory effort normal.  Good O2 sat on room air. Cardiovascular system:Rate controlled; no rubs, no gallops.   Gastrointestinal system: Abdomen is nondistended, soft and nontender. No organomegaly or masses felt. Normal bowel sounds heard. Central nervous system: Alert and oriented. No focal neurological deficits. Extremities: No C/C/E, +pedal pulses Skin: No rashes, lesions or ulcers Psychiatry: Judgement and  insight appear normal. Mood & affect appropriate.     Data Reviewed: I have personally reviewed following labs and imaging studies  CBC: Recent Labs  Lab 10/02/19  0143 10/02/19 0645  WBC 6.0 7.3  NEUTROABS 5.4  --   HGB 6.0* 7.2*  HCT 20.1* 23.2*  MCV 91.0 86.2  PLT 195 671   Basic Metabolic Panel: Recent Labs  Lab 10/01/19 2336  NA 135  K 4.5  CL 107  CO2 15*  GLUCOSE 145*  BUN 34*  CREATININE 1.33*  CALCIUM 7.9*   GFR: Estimated Creatinine Clearance: 30.8 mL/min (A) (by C-G formula based on SCr of 1.33 mg/dL (H)).   Liver Function Tests: Recent Labs  Lab 10/01/19 2336  AST 19  ALT 13  ALKPHOS 83  BILITOT 0.5  PROT 5.7*  ALBUMIN 3.0*   Urine analysis:    Component Value Date/Time   COLORURINE YELLOW 01/14/2019 0226   APPEARANCEUR CLEAR 01/14/2019 0226   LABSPEC 1.012 01/14/2019 0226   PHURINE 7.0 01/14/2019 0226   GLUCOSEU NEGATIVE 01/14/2019 0226   HGBUR NEGATIVE 01/14/2019 0226   BILIRUBINUR NEGATIVE 01/14/2019 0226   KETONESUR NEGATIVE 01/14/2019 0226   PROTEINUR NEGATIVE 01/14/2019 0226   NITRITE NEGATIVE 01/14/2019 0226   LEUKOCYTESUR NEGATIVE 01/14/2019 0226    Recent Results (from the past 240 hour(s))  SARS CORONAVIRUS 2 (TAT 6-24 HRS) Nasopharyngeal Nasopharyngeal Swab     Status: None   Collection Time: 10/02/19  1:11 AM   Specimen: Nasopharyngeal Swab  Result Value Ref Range Status   SARS Coronavirus 2 NEGATIVE NEGATIVE Final    Comment: (NOTE) SARS-CoV-2 target nucleic acids are NOT DETECTED. The SARS-CoV-2 RNA is generally detectable in upper and lower respiratory specimens during the acute phase of infection. Negative results do not preclude SARS-CoV-2 infection, do not rule out co-infections with other pathogens, and should not be used as the sole basis for treatment or other patient management decisions. Negative results must be combined with clinical observations, patient history, and epidemiological information. The expected result is Negative. Fact Sheet for Patients: SugarRoll.be Fact Sheet for Healthcare Providers:  https://www.woods-mathews.com/ This test is not yet approved or cleared by the [place] FDA and  has been authorized for detection and/or diagnosis of SARS-CoV-2 by FDA under an Emergency Use Authorization (EUA). This EUA will remain  in effect (meaning this test can be used) for the duration of the COVID-19 declaration under Section 56 4(b)(1) of the Act, 21 U.S.C. section 360bbb-3(b)(1), unless the authorization is terminated or revoked sooner. Performed at [facility], [address], [place], [place] [address]   MRSA PCR Screening     Status: None   Collection Time: 10/02/19  8:30 AM   Specimen: Nasal Mucosa; Nasopharyngeal  Result Value Ref Range Status   MRSA by PCR NEGATIVE NEGATIVE Final    Comment:        The GeneXpert MRSA Assay (FDA approved for NASAL specimens only), is one component of a comprehensive MRSA colonization surveillance program. It is not intended to diagnose MRSA infection nor to guide or monitor treatment for MRSA infections. Performed at [facility], [address], [place],  [address]   KOH prep     Status: None   Collection Time: 10/03/19  9:23 AM   Specimen: PATH GI Other  Result Value Ref Range Status   Specimen Description ESOPHAGUS  Final   Special Requests GASTRO  Final   KOH Prep   Final    YEAST Performed  at [facility], [address] [place], [place] [address]    Report Status 10/03/2019 FINAL  Final     Radiology Studies: No results found.   Scheduled Meds: . bisoprolol  2.5 mg Oral Daily  . escitalopram  20 mg Oral QHS  . lidocaine      . meperidine      . metoCLOPramide (REGLAN) injection  10 mg Intravenous Once  . midazolam      . nystatin  5 mL Oral QID  . pantoprazole (PROTONIX) IV  40 mg Intravenous Q12H  . sodium chloride flush       Continuous Infusions: . sodium chloride Stopped (10/02/19 1153)     LOS: 1 day    Time spent: 30 minutes   Barton Dubois, MD [facility] Pager [phone]  10/03/2019, 12:23 PM

## 2019-10-03 NOTE — Op Note (Signed)
[facility] Patient Name: Misty [name] Procedure Date: 10/03/2019 8:01 AM MRN: [id number] Date of Birth: March 16, 1941 Attending MD: Hildred Laser , MD CSN: [id number] Age: 78 Admit Type: Inpatient Procedure:                Upper GI endoscopy Indications:              Iron deficiency anemia secondary to chronic blood                            loss Providers:                Hildred Laser, MD, Lurline Del, RN, Nelma Rothman,                            Technician Referring MD:             Barton Dubois, MD Medicines:                Lidocaine spray, Meperidine 50 mg IV, Midazolam 10                            mg IV Complications:            No immediate complications. Estimated Blood Loss:     Estimated blood loss: none. Procedure:                Pre-Anesthesia Assessment:                           - Prior to the procedure, a History and Physical                            was performed, and patient medications and                            allergies were reviewed. The patient's tolerance of                            previous anesthesia was also reviewed. The risks                            and benefits of the procedure and the sedation                            options and risks were discussed with the patient.                            All questions were answered, and informed consent                            was obtained. Prior Anticoagulants: The patient                            last took Eliquis (apixaban) 2 days prior to the  procedure. ASA Grade Assessment: III - A patient                            with severe systemic disease. After reviewing the                            risks and benefits, the patient was deemed in                            satisfactory condition to undergo the procedure.                           After obtaining informed consent, the endoscope was                            passed under direct vision. Throughout the                         procedure, the patient's blood pressure, pulse, and                            oxygen saturations were monitored continuously. The                            [id number]-H190 ([id number]) scope was introduced through the                            mouth, and advanced to the second part of duodenum.                            The upper GI endoscopy was accomplished without                            difficulty. The patient tolerated the procedure                            well. Scope In: 9:12:01 AM Scope Out: 9:28:33 AM Total Procedure Duration: 0 hours 16 minutes 32 seconds  Findings:      The hypopharynx was normal.      Patchy, white plaques were found in the proximal esophagus and in the       mid esophagus. Cells for cytology were obtained by brushing.      The exam of the esophagus was otherwise normal.      The Z-line was regular and was found 40 cm from the incisors.      A 2 cm hiatal hernia was present.      Mild portal hypertensive gastropathy was found in the gastric fundus and       in the gastric body.      Mild gastric antral vascular ectasia without bleeding was present in the       gastric antrum and in the prepyloric region of the stomach.      Mildly scalloped mucosa was found in the duodenal bulb.      The second portion of the duodenum was normal. Small amount of food  debris noted. Impression:               - Normal hypopharynx.                           - Esophageal plaques were found, suspicious for                            candidiasis. Cells for cytology obtained.                           - Z-line regular, 40 cm from the incisors.                           - 2 cm hiatal hernia.                           - Portal hypertensive gastropathy.                           - Gastric antral vascular ectasia without bleeding.                           - Scalloped mucosa was found in the duodenum,                            diagnostic of celiac disease(  patient with                            confirmed diagnosis before)                           - Normal second portion of the duodenum. Small                            amount of food debris noted. Moderate Sedation:      Moderate (conscious) sedation was administered by the endoscopy nurse       and supervised by the endoscopist. The following parameters were       monitored: oxygen saturation, heart rate, blood pressure, CO2       capnography and response to care. Total physician intraservice time was       24 minutes. Recommendation:           - Return patient to hospital ward for ongoing care.                           - Continue present medications.                           - Clear liquid diet today.                           - Mycostatin suspension 500,000 units S&S qid.                           - Metoclopromide 10 mg IV once. Procedure Code(s):        ---  Professional ---                           [phone], Esophagogastroduodenoscopy, flexible,                            transoral; diagnostic, including collection of                            specimen(s) by brushing or washing, when performed                            (separate procedure)                           99153, Moderate sedation; each additional 15                            minutes intraservice time                           G0500, Moderate sedation services provided by the                            same physician or other qualified health care                            professional performing a gastrointestinal                            endoscopic service that sedation supports,                            requiring the presence of an independent trained                            observer to assist in the monitoring of the                            patient's level of consciousness and physiological                            status; initial 15 minutes of intra-service time;                            patient age 8  years or older (additional time may                            be reported with [phone], as appropriate) Diagnosis Code(s):        --- Professional ---                           K22.9, Disease of esophagus, unspecified                           K44.9, Diaphragmatic hernia without obstruction or  gangrene                           K76.6, Portal hypertension                           K31.89, Other diseases of stomach and duodenum                           K31.819, Angiodysplasia of stomach and duodenum                            without bleeding                           K90.0, Celiac disease                           D50.0, Iron deficiency anemia secondary to blood                            loss (chronic) CPT copyright 2019 American Medical Association. All rights reserved. The codes documented in this report are preliminary and upon coder review may  be revised to meet current compliance requirements. Hildred Laser, MD Hildred Laser, MD 10/03/2019 9:58:45 AM This report has been signed electronically. Number of Addenda: 0

## 2019-10-03 NOTE — Progress Notes (Signed)
Given capsule has made it to the colon Will terminate the study and download data and it would be reviewed tomorrow KOH prep positive for yeast. Patient begun on gluten-free diet. Hemoglobin from this morning is 7.1 g. Hemoglobin will be checked in a.m.

## 2019-10-03 NOTE — Progress Notes (Signed)
Patient has no complaints. States she is starving. Hemoglobin not done this morning. Patient is agreeable to proceed with EGD with endoscopic placement of given capsule.

## 2019-10-04 DIAGNOSIS — K2100 Gastro-esophageal reflux disease with esophagitis, without bleeding: Secondary | ICD-10-CM

## 2019-10-04 DIAGNOSIS — K633 Ulcer of intestine: Secondary | ICD-10-CM

## 2019-10-04 DIAGNOSIS — K6389 Other specified diseases of intestine: Secondary | ICD-10-CM

## 2019-10-04 DIAGNOSIS — D62 Acute posthemorrhagic anemia: Principal | ICD-10-CM

## 2019-10-04 LAB — CBC
HCT: 26 % — ABNORMAL LOW (ref 36.0–46.0)
Hemoglobin: 7.8 g/dL — ABNORMAL LOW (ref 12.0–15.0)
MCH: 27.3 pg (ref 26.0–34.0)
MCHC: 30 g/dL (ref 30.0–36.0)
MCV: 90.9 fL (ref 80.0–100.0)
Platelets: 172 10*3/uL (ref 150–400)
RBC: 2.86 MIL/uL — ABNORMAL LOW (ref 3.87–5.11)
RDW: 17.2 % — ABNORMAL HIGH (ref 11.5–15.5)
WBC: 7.1 10*3/uL (ref 4.0–10.5)
nRBC: 0.8 % — ABNORMAL HIGH (ref 0.0–0.2)

## 2019-10-04 MED ORDER — SODIUM CHLORIDE 0.9 % IV SOLN
510.0000 mg | Freq: Once | INTRAVENOUS | Status: AC
  Administered 2019-10-04: 13:00:00 510 mg via INTRAVENOUS
  Filled 2019-10-04: qty 17

## 2019-10-04 MED ORDER — NYSTATIN 100000 UNIT/ML MT SUSP: 5.0000 mL | Freq: Four times a day (QID) | OROMUCOSAL | 0 refills | Status: AC

## 2019-10-04 NOTE — Discharge Summary (Signed)
Physician Discharge Summary  Misty [name][id number] DOB: [birth year] DOA: 78/13/2020  PCP: Baruch Gouty, FNP  Admit date: 10/01/2019 Discharge date: 10/04/2019  Time spent: 35 minutes  Recommendations for Outpatient Follow-up:  1. Repeat basic metabolic panel to follow electrolytes and renal function 2. Repeat CBC to follow hemoglobin trend.   Discharge Diagnoses:  Active Problems:   Anemia   PAF (paroxysmal atrial fibrillation) (HCC)   Chronic systolic HF (heart failure) (HCC) Celiac disease Candida esophagitis  Discharge Condition: Stable and improved.  Patient discharged home with instruction to follow-up with PCP, cardiology and gastroenterology service.  Diet recommendation: Gluten-free/low-sodium diet.  Filed Weights   10/01/19 2259 10/02/19 0813 10/04/19 0431  Weight: 53.1 kg 56 kg 55.7 kg    History of present illness:  78 y.o.female,with history of recurrent GI bleed, chronic blood loss anemia, paroxysmal atrial fibrillation on Eliquis, GERD, celiac disease, diverticulosis, anxiety, depression, systolic CHF, pulmonary fibrosis, ischemic colitis came to hospital with complaints of generalized weakness. Patient has history of recurrent GI bleed and has been followed by gastroenterology as outpatient. She was supposed to get a EGD with capsule endoscopy, and it was canceled by patient as she developed diarrhea on 09/13/2019. She has history of erosive reflux esophagitis, portal gastropathy and multiple EGDs. 2 attempts at capsule endoscopy have been completed, capsule never left her stomach. GI bleeding scan was negative. Today patient presenting with generalized weakness, in the ED hemoglobin was 6.0. 2 units PRBC have been ordered. She denies chest pain or shortness of breath. Denies fever or cough. Denies nausea vomiting or diarrhea.  Hospital Course:  1-acute on chronic blood loss anemia -Patient is status post endoscopy demonstrating GAVE, but no  active bleeding -Capsule endoscopy demonstrated inflammatory changes in her small bowel secondary to celiac disease and also to known actively bleeding ulcers in the ileum. -Mild Candida esophagitis was also appreciated (see below for treatment details). -Patient has remained asymptomatic, no signs of overt bleeding appreciated and is tolerating diet without problems. -Hemoglobin on presentation 6.0; after transfusion hemoglobin 7.8 at discharge. -Continue PPI. -Follow up with GI service as an outpatient.  2-paroxysmal atrial fibrillation -Eliquis will be discontinued at discharge; p.o. and cons discussed with patient and at this moment she has agree to hold the use of anticoagulation. -Resume use of bisoprolol -Heart rate has remained stable and at this moment will be safe to discontinue telemetry. -During follow-up visit with cardiology service, discussion about EC baby aspirin to assist minimizing risk for stroke can be evaluated.  3-chronic systolic heart failure -Compensated -Resume beta-blocker and Lasix as previously instructed -Patient encouraged to follow low-sodium diet and to monitor daily weights. -Continue patient follow-up with cardiology service.  4-history of pulmonary fibrosis -Continue as needed bronchodilators -No requiring oxygen supplementation -stable breathing appreciated.  5-anxiety/depression -Stable mood -No suicidal ideation or hallucinations -Continue Lexapro and PRN xanax.  6-mild Candida esophagitis -Patient will be treated with nystatin  Procedures:  Endoscopy/capsule endoscopy: Demonstrating mild Candida esophagitis; GAVE without signs of acute bleeding also positive inflammatory changes in her small intestine compatible with celiac disease and two not actively bleeding ulcers in the ileum.  Consultations:  General surgery.  Discharge Exam: Vitals:   10/04/19 0431 10/04/19 0854  BP: 132/70 121/62  Pulse: 82 76  Resp: 16 16  Temp: 99.3  F (37.4 C)   SpO2: 92% 96%   General exam: Alert, awake, oriented x 3, feeling slightly better today.  No chest pain, no nausea, no vomiting.  No signs of overt bleeding.  Tolerating diet. Respiratory system: Clear to auscultation. Respiratory effort normal.  Good O2 sat on room air. Cardiovascular system:Rate controlled; no rubs, no gallops.   Gastrointestinal system: Abdomen is nondistended, soft and nontender. No organomegaly or masses felt. Normal bowel sounds heard. Central nervous system: Alert and oriented. No focal neurological deficits. Extremities: No C/C/E, +pedal pulses Skin: No rashes, lesions or ulcers Psychiatry: Judgement and insight appear normal. Mood & affect appropriate.    Discharge Instructions   Discharge Instructions    (HEART FAILURE PATIENTS) Call MD:  Anytime you have any of the following symptoms: 1) 3 pound weight gain in 24 hours or 5 pounds in 1 week 2) shortness of breath, with or without a dry hacking cough 3) swelling in the hands, feet or stomach 4) if you have to sleep on extra pillows at night in order to breathe.   Complete by: As directed    Diet - low sodium heart healthy   Complete by: As directed    Discharge instructions   Complete by: As directed    Take medications as prescribed Maintain adequate hydration Follow gluten free/low-sodium diet Arrange follow-up with PCP in 10 days Follow-up with cardiology service as previously instructed Follow-up with gastroenterologist service (office will contact you with appointment details).     Allergies as of 10/04/2019      Reactions   Codeine Nausea And Vomiting   Tramadol Nausea And Vomiting   Asa [aspirin] Other (See Comments)   Patient is unaware of allergy   Azithromycin Other (See Comments)   Patient is unaware of allergy   Celebrex [celecoxib] Other (See Comments)   Irritated stomach   Cymbalta [duloxetine Hcl] Other (See Comments)   Felt groggy   Prednisone Rash   She has seen  the podiatrist and he had injected cortisone in her feet and she had also taken a peel at home. She had a severe reaction to her face with a rash and had to take Benadryl to resolve the symptom. She actually did get more cortisone shots, but no additional reactions to the shots.   Vioxx [rofecoxib] Other (See Comments)   Unknown   Zelnorm [tegaserod] Other (See Comments)   Patient is unaware of allergy   Zocor [simvastatin] Other (See Comments)   Patient is unaware of allergy      Medication List    STOP taking these medications   apixaban 5 MG Tabs tablet Commonly known as: ELIQUIS     TAKE these medications   acetaminophen 500 MG tablet Commonly known as: TYLENOL Take 500 mg by mouth every 8 (eight) hours as needed for mild pain, moderate pain or headache.   albuterol 108 (90 Base) MCG/ACT inhaler Commonly known as: VENTOLIN HFA Inhale 2 puffs into the lungs every 6 (six) hours as needed for wheezing or shortness of breath (cough).   ALPRAZolam 0.5 MG tablet Commonly known as: XANAX Take 0.5 mg by mouth 2 (two) times daily as needed.   bisoprolol 5 MG tablet Commonly known as: ZEBETA Take 0.5 tablets (2.5 mg total) by mouth daily.   cholecalciferol 1000 units tablet Commonly known as: VITAMIN D Take 2,000 Units by mouth daily.   escitalopram 20 MG tablet Commonly known as: LEXAPRO TAKE ONE TABLET AT BEDTIME   furosemide 20 MG tablet Commonly known as: LASIX Take 1 tablet (20 mg total) by mouth daily.   metoCLOPramide 5 MG tablet Commonly known as: Reglan Take 1 tablet (5 mg  total) by mouth 3 (three) times daily before meals.   multivitamin with minerals Tabs tablet Take 1 tablet by mouth daily. Centrum Silver   nystatin 100000 UNIT/ML suspension Commonly known as: MYCOSTATIN Take 5 mLs (500,000 Units total) by mouth 4 (four) times daily for 5 days.   pantoprazole 40 MG tablet Commonly known as: PROTONIX Take 1 tablet (40 mg total) by mouth 2 (two)  timesdaily before a meal. What changed: See the new instructions.   potassium chloride SA 20 MEQ tablet Commonly known as: KLOR-CON Take 1 tablet (20 mEq total) by mouth daily for 30 days.   vitamin B-12 1000 MCG tablet Commonly known as: CYANOCOBALAMIN Take 1,000 mcg by mouth daily.      Allergies  Allergen Reactions  . Codeine Nausea And Vomiting  . Tramadol Nausea And Vomiting  . Asa [Aspirin] Other (See Comments)    Patient is unaware of allergy  . Azithromycin Other (See Comments)    Patient is unaware of allergy  . Celebrex [Celecoxib] Other (See Comments)    Irritated stomach   . Cymbalta [Duloxetine Hcl] Other (See Comments)    Felt groggy  . Prednisone Rash    She has seen the podiatrist and he had injected cortisone in her feet and she had also taken a peel at home. She had a severe reaction to her face with a rash and had to take Benadryl to resolve the symptom. She actually did get more cortisone shots, but no additional reactions to the shots.  . Vioxx [Rofecoxib] Other (See Comments)    Unknown  . Zelnorm [Tegaserod] Other (See Comments)    Patient is unaware of allergy  . Zocor [Simvastatin] Other (See Comments)    Patient is unaware of allergy   Follow-up Information    Baruch Gouty, FNP Follow up in 8 day(s).   Specialty: Family Medicine Why: Tuesday, October 12, 2019, at 8:20a.m. Contact information: [place] [address] [phone]        Minus Breeding, MD .   Specialty: Cardiology Contact information: [address] [place] [address] [phone]           The results of significant diagnostics from this hospitalization (including imaging, microbiology, ancillary and laboratory) are listed below for reference.    Significant Diagnostic Studies: No results found.  Microbiology: Recent Results (from the past 240 hour(s))  SARS CORONAVIRUS 2 (TAT 6-24 HRS) Nasopharyngeal Nasopharyngeal Swab      Status: None   Collection Time: 10/02/19  1:11 AM   Specimen: Nasopharyngeal Swab  Result Value Ref Range Status   SARS Coronavirus 2 NEGATIVE NEGATIVE Final    Comment: (NOTE) SARS-CoV-2 target nucleic acids are NOT DETECTED. The SARS-CoV-2 RNA is generally detectable in upper and lower respiratory specimens during the acute phase of infection. Negative results do not preclude SARS-CoV-2 infection, do not rule out co-infections with other pathogens, and should not be used as the sole basis for treatment or other patient management decisions. Negative results must be combined with clinical observations, patient history, and epidemiological information. The expected result is Negative. Fact Sheet for Patients: SugarRoll.be Fact Sheet for Healthcare Providers: https://www.woods-mathews.com/ This test is not yet approved or cleared by the [place] FDA and  has been authorized for detection and/or diagnosis of SARS-CoV-2 by FDA under an Emergency Use Authorization (EUA). This EUA will remain  in effect (meaning this test can be used) for the duration of the COVID-19 declaration under Section  56 4(b)(1) of the Act, 21 U.S.C. section 360bbb-3(b)(1), unless the authorization is terminated or revoked sooner. Performed at [facility], [place] [address], [place], [place] [address]   MRSA PCR Screening     Status: None   Collection Time: 10/02/19  8:30 AM   Specimen: Nasal Mucosa; Nasopharyngeal  Result Value Ref Range Status   MRSA by PCR NEGATIVE NEGATIVE Final    Comment:        The GeneXpert MRSA Assay (FDA approved for NASAL specimens only), is one component of a comprehensive MRSA colonization surveillance program. It is not intended to diagnose MRSA infection nor to guide or monitor treatment for MRSA infections. Performed at [facility], [address], [place], [place] [address]   [place] prep     Status: None   Collection  Time: 10/03/19  9:23 AM   Specimen: PATH GI Other  Result Value Ref Range Status   Specimen Description ESOPHAGUS  Final   Special Requests GASTRO  Final   KOH Prep   Final    YEAST Performed at [facility], [address] [place], [place] [address]    Report Status 10/03/2019 FINAL  Final     Labs: Basic Metabolic Panel: Recent Labs  Lab 10/01/19 2336  NA 135  K 4.5  CL 107  CO2 15*  GLUCOSE 145*  BUN 34*  CREATININE 1.33*  CALCIUM 7.9*   Liver Function Tests: Recent Labs  Lab 10/01/19 2336  AST 19  ALT 13  ALKPHOS 83  BILITOT 0.5  PROT 5.7*  ALBUMIN 3.0*   CBC: Recent Labs  Lab 10/02/19 0143 10/02/19 0645 10/03/19 1249 10/04/19 0531  WBC 6.0 7.3 6.0 7.1  NEUTROABS 5.4  --   --   --   HGB 6.0* 7.2* 7.1* 7.8*  HCT 20.1* 23.2* 23.4* 26.0*  MCV 91.0 86.2 89.7 90.9  PLT 195 180 154 172   BNP (last 3 results) Recent Labs    03/25/19 2033 07/10/19 1421  BNP >4,500.0* 365.0*    Signed:  Barton Dubois MD.  [facility] 10/04/2019, 3:01 PM

## 2019-10-04 NOTE — Progress Notes (Signed)
Nutrition  Note Received consult to provide Gluten-free diet education. Patient says she has been in the hospital 5 different times this year and did not understand the importance of complying with diet parameters.   Current diet order is Gluten free, however she has been consuming regular diet at home.   We reviewed the specifics of why take time to learn what is best to eat. Discussed specific recommended foods to choose and foods to avoid. Handout -Celiac Disease Nutrition Therapy provided.  Emphasized the need for consistent label reading. Patient expressed understanding and desire to adjust eating habits in order to potentially prevent/relieve future excessive flatulence or other complications like bone disease.  Usual weight range 53-57 kg. Stable.  Body mass index is 19.23 kg/m. Patient meets criteria for low normal based on current BMI.   Labs and medications reviewed.  BMP Latest Ref Rng & Units 10/01/2019 08/27/2019 08/26/2019  Glucose 70 - 99 mg/dL 145(H) 100(H) 122(H)  BUN 8 - 23 mg/dL 34(H) 14 16  Creatinine 0.44 - 1.00 mg/dL 1.33(H) 0.98 0.92  BUN/Creat Ratio 12 - 28 - - -  Sodium 135 - 145 mmol/L 135 140 137  Potassium 3.5 - 5.1 mmol/L 4.5 3.8 4.1  Chloride 98 - 111 mmol/L 107 103 102  CO2 22 - 32 mmol/L 15(L) 27 26  Calcium 8.9 - 10.3 mg/dL 7.9(L) 8.8(L) 9.0     No further nutrition interventions warranted at this time.    Colman Cater MS,RD,CSG,LDN Office: [phone] Pager: [phone]

## 2019-10-04 NOTE — Op Note (Signed)
Small Bowel [place] Capsule Study Procedure date: 10/03/2019  Referring Provider:  Barton Dubois, MD PCP:  Dr. Thayer Ohm, Connye Burkitt, FNP  Indication for procedure:    Patient is 78 year old Caucasian female with history of atrial fibrillation on anticoagulant who has been hospitalized multiple times in the last 4 months for anemia and GI bleed.  She has undergone multiple studies and bleeding source has not been identified.  Small bowel given capsule study was attempted twice(once inpatient and once outpatient) but capsule never left the stomach.  She had declined endoscopic placement of given capsule until now. She is undergoing the study to examine small bowel which may be source of her GI blood loss. Patient has history of celiac disease.  Celiac disease was diagnosed on duodenal biopsy 2 years ago.  Patient unfortunately has been noncompliant with gluten-free diet.   Findings:   Given capsule was placed endoscopically into the second part of duodenum. Study is complete as given capsule made with the cecum. Scalloping in order to bulbar mucosa. Mucosal edema noted to multiple jejunal folds. 2 small ulcers noted in distal small bowel(image at 04:00:02and 04:00:03) without stigmata of bleed.  First Gastric image: Not applicable as given capsule placed in the second part of duodenum endoscopically First Duodenal image: Not applicable as given capsule was placed into second part of duodenum endoscopically. First Ileo-Cecal Valve image: 5 hours 19 minutes and 39 seconds First Cecal image: 5 hours 36 minutes and 24 seconds Gastric Passage time: Not applicable Small Bowel Passage time:    Summary & Recommendations:  Scalloping noted to bulbar mucosa as well as edema to to mucosal folds involving most of the jejunum. Two small ulcers with clean base and distal small bowel/ileum. No blood noted in small bowel on the study. Patient could be losing blood from her small bowel injury secondary to  celiac disease as patient is not pursuing gluten-free diet. Other potential source of blood loss is gastric antral vascular ectasia which appears to be mild and she has not been documented to be bleeding from these lesions before. Patient encouraged to stay on gluten-free diet as it might heal small bowel injury and prevent further bleeding episodes.

## 2019-10-04 NOTE — Progress Notes (Addendum)
  Subjective:  Patient complains of excessive flatulence.  She states she has not been following dietary recommendations.  She says she is not on gluten-free diet and eats whatever she wants to.  Objective: Blood pressure 121/62, pulse 76, temperature 99.3 F (37.4 C), temperature source Oral, resp. rate 16, height _0  (1.702 m), weight 55.7 kg, SpO2 96 %. Patient is alert and in no acute distress. Abdomen is full but soft and nontender.  Labs/studies Results:  CBC Latest Ref Rng & Units 10/04/2019 10/03/2019 10/02/2019  WBC 4.0 - 10.5 K/uL 7.1 6.0 7.3  Hemoglobin 12.0 - 15.0 g/dL 7.8(L) 7.1(L) 7.2(L)  Hematocrit 36.0 - 46.0 % 26.0(L) 23.4(L) 23.2(L)  Platelets 150 - 400 K/uL 172 154 180    CMP Latest Ref Rng & Units 10/01/2019 08/27/2019 08/26/2019  Glucose 70 - 99 mg/dL 145(H) 100(H) 122(H)  BUN 8 - 23 mg/dL 34(H) 14 16  Creatinine 0.44 - 1.00 mg/dL 1.33(H) 0.98 0.92  Sodium 135 - 145 mmol/L 135 140 137  Potassium 3.5 - 5.1 mmol/L 4.5 3.8 4.1  Chloride 98 - 111 mmol/L 107 103 102  CO2 22 - 32 mmol/L 15(L) 27 26  Calcium 8.9 - 10.3 mg/dL 7.9(L) 8.8(L) 9.0  Total Protein 6.5 - 8.1 g/dL 5.7(L) - -  Total Bilirubin 0.3 - 1.2 mg/dL 0.5 - -  Alkaline Phos 38 - 126 U/L 83 - -  AST 15 - 41 U/L 19 - -  ALT 0 - 44 U/L 13 - -    Hepatic Function Latest Ref Rng & Units 10/01/2019 08/25/2019 08/10/2019  Total Protein 6.5 - 8.1 g/dL 5.7(L) 5.8(L) 6.8  Albumin 3.5 - 5.0 g/dL 3.0(L) 3.7 3.6  AST 15 - 41 U/L _1 ALT 0 - 44 U/L _2 Alk Phosphatase 38 - 126 U/L 83 133(H) 110  Total Bilirubin 0.3 - 1.2 mg/dL 0.5 <0.2 0.2(L)  Bilirubin, Direct 0.0 - 0.2 mg/dL - - -     Assessment:  #1.  Iron deficiency anemia secondary to chronic GI blood loss.  Hemoglobin this morning is 7.8 g.  She has received 3 units of PRBCs.  She also received single dose of Feraheme.  She was noted to have mild gastric antral vascular ectasia without stigmata of bleed on EGD yesterday. Small bowel given  capsule study reveals scalloping to bulbar mucosa as well as edema to mucosal folds jejunum.  2 small ulcers identified in distal small bowel without stigmata of bleeding.  All of these changes would appear to be due to celiac disease in light of dietary noncompliance.  Suspect not only she is losing blood from the GI tract she also may not be absorbing iron.  #2.  Celiac disease.  Is not following dietary recommendations.  Will request dietary consultation for patient education.  #3.  Mild Candida esophagitis noted on EGD yesterday.  She is not having any symptoms.  Should continue Mycostatin for 10 days.  Recommendations  Dietary consultation for patient education regarding gluten-free diet. Will give another infusion of Feraheme prior to discharge. Reevaluate need for continued anticoagulation i.e. risk versus benefit.

## 2019-10-04 NOTE — TOC Initial Note (Signed)
Transition of Care [facility]) - Initial/Assessment Note    Patient Details  Name: Misty [name] MRN: [id number] Date of Birth: 1941-08-23  Transition of Care [facility]) CM/SW Contact:    Annice Needy, LCSW Phone Number: 10/04/2019, 1:08 PM  Clinical Narrative:                 Patient from home with spouse. Admitted for anemia. High risk readmission score due to the number of hospitalizations and number of ED visits.  Patient chooses to not drive currently. He husband takes her to appointments. She ambulates with a walker. Patient has no issues obtaining medications nor maintaining appointments. Patient indicated that [employer] did pick up the oxygen that she was not using.  Expected Discharge Plan: Home/Self Care Barriers to Discharge: Continued Medical Work up   Patient Goals and CMS Choice Patient states their goals for this hospitalization and ongoing recovery are:: To return home and back to baseline.      Expected Discharge Plan and Services Expected Discharge Plan: Home/Self Care         Expected Discharge Date: 10/04/19                                    Prior Living Arrangements/Services   Lives with:: Spouse Patient language and need for interpreter reviewed:: Yes Do you feel safe going back to the place where you live?: Yes      Need for Family Participation in Patient Care: Yes (Comment) Care giver support system in place?: Yes (comment) Current home services: DME Criminal Activity/Legal Involvement Pertinent to Current Situation/Hospitalization: No - Comment as needed  Activities of Daily Living Home Assistive Devices/Equipment: Cane (specify quad or straight), Walker (specify type)(rolling walker) ADL Screening (condition at time of admission) Patient's cognitive ability adequate to safely complete daily activities?: Yes Is the patient deaf or have difficulty hearing?: No Does the patient have difficulty seeing, even when wearing glasses/contacts?:  No Does the patient have difficulty concentrating, remembering, or making decisions?: No Patient able to express need for assistance with ADLs?: No Does the patient have difficulty dressing or bathing?: No Independently performs ADLs?: Yes (appropriate for developmental age) Does the patient have difficulty walking or climbing stairs?: No Weakness of Legs: None Weakness of Arms/Hands: None  Permission Sought/Granted Permission sought to share information with : PCP                Emotional Assessment Appearance:: Appears stated age   Affect (typically observed): Appropriate Orientation: : Oriented to Self, Oriented to Place, Oriented to Situation, Oriented to  Time Alcohol / Substance Use: Not Applicable Psych Involvement: No (comment)  Admission diagnosis:  Anemia, unspecified type [D64.9] Patient Active Problem List   Diagnosis Date Noted  . Acute on chronic blood loss anemia 08/26/2019  . Cardiomyopathy (HCC) 08/17/2019  . Nonrheumatic mitral valve regurgitation 08/17/2019  . Gastrointestinal hemorrhage 08/16/2019  . Chronic systolic CHF (congestive heart failure) (HCC) 08/11/2019  . Acute anemia 08/10/2019  . GI bleed 07/28/2019  . Gastroesophageal reflux disease with esophagitis 07/20/2019  . Paroxysmal atrial fibrillation (HCC) 07/20/2019  . Anemia 07/10/2019  . Aortic atherosclerosis (HCC) 07/09/2019  . Vitamin D deficiency 07/09/2019  . GAD (generalized anxiety disorder) 07/09/2019  . Depression, recurrent (HCC) 07/09/2019  . Controlled substance agreement signed 07/09/2019  . Educated about COVID-19 virus infection 04/07/2019  . S/P thoracentesis   . Pleural effusion, right   .  Acute systolic heart failure ([place]) 03/31/2019  . Lobar pneumonia ([place])   . Acute hypoxemic respiratory failure ([place])   . Community acquired pneumonia 03/26/2019  . Elevated troponin 03/25/2019  . S/P IM nail left hip 01/07/2019 01/26/2019  . H/O kyphoplasty 01/14/2019  . Iron  deficiency anemia 01/14/2019  . Acute blood loss anemia 01/13/2019  . Constipation due to opioid therapy 01/13/2019  . Anxiety and depression 01/13/2019  . Hypokalemia 01/10/2019  . Displaced intertrochanteric fracture of left femur, sequela 01/06/2019  . Chronic midline low back pain without sciatica 10/09/2018  . Chronic midline thoracic back pain 10/09/2018  . Osteopenia determined by x-ray 04/16/2018  . Pulmonary fibrosis ([place]) 04/07/2018  . Non-intractable vomiting with nausea 10/06/2017  . Malnutrition of moderate degree 01/29/2016  . Symptomatic anemia 01/27/2016  . Generalized weakness 01/27/2016  . Abnormal chest x-ray 01/27/2016  . Tobacco use disorder 01/27/2016  . Colitis   . Hematochezia 02/26/2015  . Ischemic colitis ([place]) 02/26/2015  . Orthostatic hypotension 02/26/2015  . Nontraumatic compression fracture of T11 vertebra (HCC) 07/08/2014  . Hyperlipidemia 08/19/2013  . Fibrocystic breast disease 08/19/2013  . History of migraine headaches 08/19/2013  . GERD (gastroesophageal reflux disease) 04/07/2013  . Osteoporosis, postmenopausal 04/07/2013   PCP:  Baruch Gouty, FNP Pharmacy:   [facility], [place] [address] Phone: [phone] Fax: [place], [place] - [address] [place] [address] Phone: [phone] Fax: [phone]     Social Determinants of Health (SDOH) Interventions    Readmission Risk Interventions Readmission Risk Prevention Plan 08/12/2019 07/13/2019 07/13/2019  Transportation Screening Complete - Complete  PCP or Specialist Appt within 3-5 Days - Complete -  [place] or [place] - - Patient refused  Social Work Consult for [place] Planning/Counseling - - Complete  Palliative Care Screening - - Complete  Medication Review Press photographer) Complete - Complete  PCP or Specialist  appointment within 3-5 days of discharge Complete - -  [place] or Home Care Consult Complete - -  [facility] Recovery Care/Counseling Consult Complete - -  Palliative Care Screening Not Complete - -  [place] Not Complete - -  Some recent data might be hidden

## 2019-10-05 LAB — TYPE AND SCREEN
ABO/RH(D): O POS
Antibody Screen: NEGATIVE
Unit division: 0
Unit division: 0
Unit division: 0
Unit division: 0

## 2019-10-05 LAB — BPAM RBC
Blood Product Expiration Date: 202012122359
Blood Product Expiration Date: 202012132359
Blood Product Expiration Date: 202012142359
Blood Product Expiration Date: 202012152359
ISSUE DATE / TIME: 202011140005
ISSUE DATE / TIME: 202011140005
ISSUE DATE / TIME: 202011140225
Unit Type and Rh: 5100
Unit Type and Rh: 5100
Unit Type and Rh: 9500
Unit Type and Rh: 9500

## 2019-10-05 NOTE — Care Management Important Message (Signed)
Important Message  Patient Details  Name: Misty [name] MRN: [id number] Date of Birth: 14-May-1941   Medicare Important Message Given:  Yes     Tommy Medal 10/05/2019, 3:26 PM

## 2019-10-06 ENCOUNTER — Encounter (HOSPITAL_COMMUNITY): Payer: Self-pay | Admitting: Internal Medicine

## 2019-10-06 ENCOUNTER — Telehealth: Payer: Self-pay | Admitting: Family Medicine

## 2019-10-06 NOTE — Chronic Care Management (AMB) (Signed)
Chronic Care Management   Note  10/06/2019 Name: Misty [name] MRN: [id number] DOB: 04-30-1941  Misty [name] is a 78 y.o. year old female who is a primary care patient of Rakes, Connye Burkitt, FNP. I reached out to [name] by phone today in response to a referral sent by Misty [name]'s health plan.     [name] was given information about Chronic Care Management services today including:  1. CCM service includes personalized support from designated clinical staff supervised by her physician, including individualized plan of care and coordination with other care providers 2. 24/7 contact phone numbers for assistance for urgent and routine care needs. 3. Service will only be billed when office clinical staff spend 20 minutes or more in a month to coordinate care. 4. Only one practitioner may furnish and bill the service in a calendar month. 5. The patient may stop CCM services at any time (effective at the end of the month) by phone call to the office staff. 6. The patient will be responsible for cost sharing (co-pay) of up to 20% of the service fee (after annual deductible is met).  Patient did not agree to enrollment in care management services and does not wish to consider at this time.  Follow up plan: The patient has been provided with contact information for the chronic care management team and has been advised to call with any health related questions or concerns.   [place], [place] [address] Direct Dial: [email or web address]  Website: [email or web address]

## 2019-10-11 ENCOUNTER — Ambulatory Visit (INDEPENDENT_AMBULATORY_CARE_PROVIDER_SITE_OTHER): Payer: Medicare Other | Admitting: Family Medicine

## 2019-10-11 ENCOUNTER — Encounter: Payer: Self-pay | Admitting: Family Medicine

## 2019-10-11 ENCOUNTER — Telehealth: Payer: Self-pay | Admitting: Family Medicine

## 2019-10-11 DIAGNOSIS — I5022 Chronic systolic (congestive) heart failure: Secondary | ICD-10-CM

## 2019-10-11 DIAGNOSIS — F339 Major depressive disorder, recurrent, unspecified: Secondary | ICD-10-CM | POA: Diagnosis not present

## 2019-10-11 DIAGNOSIS — I480 Paroxysmal atrial fibrillation: Secondary | ICD-10-CM | POA: Diagnosis not present

## 2019-10-11 DIAGNOSIS — D500 Iron deficiency anemia secondary to blood loss (chronic): Secondary | ICD-10-CM | POA: Diagnosis not present

## 2019-10-11 DIAGNOSIS — E876 Hypokalemia: Secondary | ICD-10-CM | POA: Diagnosis not present

## 2019-10-11 DIAGNOSIS — E782 Mixed hyperlipidemia: Secondary | ICD-10-CM | POA: Diagnosis not present

## 2019-10-11 DIAGNOSIS — F411 Generalized anxiety disorder: Secondary | ICD-10-CM

## 2019-10-11 DIAGNOSIS — Z09 Encounter for follow-up examination after completed treatment for conditions other than malignant neoplasm: Secondary | ICD-10-CM

## 2019-10-11 MED ORDER — ALPRAZOLAM 0.5 MG PO TABS: 0.5000 mg | ORAL_TABLET | Freq: Every evening | ORAL | 5 refills | Status: AC | PRN

## 2019-10-11 NOTE — Progress Notes (Signed)
Virtual Visit via telephone Note Due to COVID-19 pandemic this visit was conducted virtually. This visit type was conducted due to national recommendations for restrictions regarding the COVID-19 Pandemic (e.g. social distancing, sheltering in place) in an effort to limit this patient's exposure and mitigate transmission in our community. All issues noted in this document were discussed and addressed.  A physical exam was not performed with this format.   I connected with Misty [name] on 10/11/2019 at 1035 by telephone and verified that I am speaking with the correct person using two identifiers. Misty [name] is currently located at home and family is currently with them during visit. The provider, Monia Pouch, FNP is located in their office at time of visit.  I discussed the limitations, risks, security and privacy concerns of performing an evaluation and management service by telephone and the availability of in person appointments. I also discussed with the patient that there may be a patient responsible charge related to this service. The patient expressed understanding and agreed to proceed.  Subjective:  Patient ID: Misty [name], female    DOB: 06/05/1941, 78 y.o.   MRN: [id number]  Chief Complaint:  Anemia and Hospitalization Follow-up   HPI: Misty [name] is a 78 y.o. female presenting on 10/11/2019 for Anemia and Hospitalization Follow-up  Pt was admitted to [facility] on 10/[facility]/2019 and discharged home on 10/04/2019 for anemia, PAF, CHF, Celiac Disease, and candida esophagitis. Pt was transfused 3 units of blood and given an iron infusion during her stay. Her GI capsule study was negative. She has been on Eliquis and this was stopped due to no other specific cause of ongoing anemia requiring blood transfusions. She reports she is feeling well since being home. She is slowly regaining her strength. She states she is not having any abnormal bleeding or bruising. No palpitations or  shortness of breath. Slight lower extremity edema, is taking lasix and diuresing well at home. Her potassium was low while in the hospital.she is taking repletion therapy. She reports her depression and anxiety are well controlled. States she is still taking Xanax at night as needed for sleep and anxiousness. She is compliant with her medications without associated side effects.  She has been exposed to COVID-19 and is going for testing today or tomorrow. She will have labs drawn when tested.   GAD 7 : Generalized Anxiety Score 10/11/2019  Nervous, Anxious, on Edge 1  Control/stop worrying 1  Worry too much - different things 1  Trouble relaxing 1  Restless 0  Easily annoyed or irritable 0  Afraid - awful might happen 0  Total GAD 7 Score 4    Depression screen [facility] 2/9 10/11/2019 07/20/2019 07/09/2019 04/16/2019 12/30/2018  Decreased Interest 0 0 0 0 0  Down, Depressed, Hopeless 0 0 1 0 0  PHQ - 2 Score 0 0 1 0 0  Some recent data might be hidden    Relevant past medical, surgical, family, and social history reviewed and updated as indicated.  Allergies and medications reviewed and updated.   Past Medical History:  Diagnosis Date   Anxiety    takes Xanax daily   Arthritis    back   Atrial fibrillation with RVR (HCC) 03/25/2019   Cataract    Chronic back pain    Constipation    OTC stool softener prn   COPD (chronic obstructive pulmonary disease) (HCC)    Depression    Diverticulosis    GERD (gastroesophageal reflux disease)  takes Ranidine daily   Hemorrhoids    History of bronchitis    History of migraine    many yrs ago   History of shingles    Hyperlipidemia    Insomnia    Migraines    Osteoporosis    PONV (postoperative nausea and vomiting)     Past Surgical History:  Procedure Laterality Date   ABDOMINAL HYSTERECTOMY     BIOPSY  07/12/2019   Procedure: BIOPSY;  Surgeon: Rogene Houston, MD;  [facility];  Service: Endoscopy;;   duodenum   COLONOSCOPY     COLONOSCOPY N/A 02/17/2015   Procedure: COLONOSCOPY;  Surgeon: Rogene Houston, MD;  [facility];  Service: Endoscopy;  Laterality: N/A;  110n - moved to 11:15 - Ann to notify pt   COLONOSCOPY N/A 07/12/2019   Procedure: COLONOSCOPY;  Surgeon: Rogene Houston, MD;  [facility];  Service: Endoscopy;  Laterality: N/A;   ESOPHAGOGASTRODUODENOSCOPY     ESOPHAGOGASTRODUODENOSCOPY N/A 01/29/2016   Procedure: ESOPHAGOGASTRODUODENOSCOPY (EGD);  Surgeon: Rogene Houston, MD;  [facility];  Service: Endoscopy;  Laterality: N/A;   ESOPHAGOGASTRODUODENOSCOPY N/A 10/14/2017   Procedure: ESOPHAGOGASTRODUODENOSCOPY (EGD);  Surgeon: Rogene Houston, MD;  [facility];  Service: Endoscopy;  Laterality: N/A;  730   ESOPHAGOGASTRODUODENOSCOPY N/A 07/12/2019   Procedure: ESOPHAGOGASTRODUODENOSCOPY (EGD);  Surgeon: Rogene Houston, MD;  [facility];  Service: Endoscopy;  Laterality: N/A;   ESOPHAGOGASTRODUODENOSCOPY N/A 10/03/2019   Procedure: ESOPHAGOGASTRODUODENOSCOPY (EGD);  Surgeon: Rogene Houston, MD;  [facility];  Service: Endoscopy;  Laterality: N/A;   EYE SURGERY Right 3/15   cataracts   GIVENS CAPSULE STUDY N/A 07/29/2019   Procedure: GIVENS CAPSULE STUDY;  Surgeon: Rogene Houston, MD;  [facility];  Service: Endoscopy;  Laterality: N/A;   GIVENS CAPSULE STUDY N/A 08/20/2019   Procedure: GIVENS CAPSULE STUDY;  Surgeon: Rogene Houston, MD;  [facility];  Service: Endoscopy;  Laterality: N/A;  [place] STUDY  10/03/2019   Procedure: GIVENS CAPSULE STUDY;  Surgeon: Rogene Houston, MD;  [facility];  Service: Endoscopy;;   INTRAMEDULLARY (IM) NAIL INTERTROCHANTERIC Left 01/07/2019   Procedure: INTRAMEDULLARY (IM) NAIL INTERTROCHANTRIC;  Surgeon: Carole Civil, MD;  Location: [facility] ORS;  Service: Orthopedics;  Laterality: Left;   KYPHOPLASTY  N/A 07/08/2014   Procedure: Thoracic Eleven Kyphoplasty;  Surgeon: Hosie Spangle, MD;  Location: [place] NEURO ORS;  Service: Neurosurgery;  Laterality: N/A;   left hip surgery     LUMBAR LAMINECTOMY/DECOMPRESSION MICRODISCECTOMY Bilateral 05/20/2013   Procedure: LUMBAR LAMINECTOMY/DECOMPRESSION MICRODISCECTOMY 1 LEVEL;  Surgeon: Hosie Spangle, MD;  Location: [place] NEURO ORS;  Service: Neurosurgery;  Laterality: Bilateral;  Bilateral Lumbar four-five laminotomy and left lumbar four-five microdiskectomy   RIGHT/LEFT HEART CATH AND CORONARY ANGIOGRAPHY N/A 03/31/2019   Procedure: RIGHT/LEFT HEART CATH AND CORONARY ANGIOGRAPHY;  Surgeon: Sherren Mocha, MD;  Location: [place] CV LAB;  Service: Cardiovascular;  Laterality: N/A;   SPINE SURGERY     Dr Carloyn Manner -  vertebraplasty    Social History   Socioeconomic History   Marital status: Married    Spouse name: Sterling Big    Number of children: Not on file   Years of education: Not on file   Highest education level: Not on file  Occupational History   Occupation: retired     Comment: [employer] work - came out in 1992  Social Designer, fashion/clothing strain: Not hard at all   Food insecurity    Worry: Never true    Inability: Never true   Transportation needs    Medical: No    Non-medical: No  Tobacco Use   Smoking status: Current Some Day Smoker    Packs/day: 1.00    Years: 52.00    Pack years: 52.00    Types: Cigarettes    Start date: 11/18/1958    Last attempt to quit: 08/20/2011    Years since quitting: 8.1   Smokeless tobacco: Never Used   Tobacco comment: 1/2-1pack off and on all her life  Substance and Sexual Activity   Alcohol use: No    Alcohol/week: 0.0 standard drinks   Drug use: No   Sexual activity: Not Currently  Lifestyle   Physical activity    Days per week: Patient refused    Minutes per session: Patient refused   Stress: Only a little  Relationships   Social connections    Talks on phone: More  than three times a week    Gets together: Once a week    Attends religious service: Patient refused    Active member of club or organization: Patient refused    Attends meetings of clubs or organizations: Patient refused    Relationship status: Married   Intimate partner violence    Fear of current or ex partner: No    Emotionally abused: No    Physically abused: No    Forced sexual activity: No  Other Topics Concern   Not on file  Social History Narrative   Lives at home with husband, Sterling Big. Had one son- now deceased     Outpatient Encounter Medications as of 10/11/2019  Medication Sig   acetaminophen (TYLENOL) 500 MG tablet Take 500 mg by mouth every 8 (eight) hours as needed for mild pain, moderate pain or headache.    albuterol (PROVENTIL HFA;VENTOLIN HFA) 108 (90 Base) MCG/ACT inhaler Inhale 2 puffs into the lungs every 6 (six) hours as needed for wheezing or shortness of breath (cough).   ALPRAZolam (XANAX) 0.5 MG tablet Take 1 tablet (0.5 mg total) by mouth at bedtime as needed for anxiety or sleep.   bisoprolol (ZEBETA) 5 MG tablet Take 0.5 tablets (2.5 mg total) by mouth daily.   cholecalciferol (VITAMIN D) 1000 UNITS tablet Take 2,000 Units by mouth daily.    escitalopram (LEXAPRO) 20 MG tablet TAKE ONE TABLET AT BEDTIME (Patient taking differently: Take 20 mg by mouth at bedtime. )   furosemide (LASIX) 20 MG tablet Take 1 tablet (20 mg total) by mouth daily.   metoCLOPramide (REGLAN) 5 MG tablet Take 1 tablet (5 mg total) by mouth 3 (three) times daily before meals.   Multiple Vitamin (MULTIVITAMIN WITH MINERALS) TABS tablet Take 1 tablet by mouth daily. Centrum Silver   pantoprazole (PROTONIX) 40 MG tablet Take 1 tablet (40 mg total) by mouth 2 (two) timesdaily before a meal. (Patient taking differently: Take 40 mg by mouth 2 (two) times daily. )   potassium chloride SA (K-DUR) 20 MEQ tablet Take 1 tablet (20 mEq total) by mouth daily for 30 days.   vitamin  B-12 (CYANOCOBALAMIN) 1000 MCG tablet Take 1,000 mcg by mouth daily.   [DISCONTINUED] ALPRAZolam (XANAX) 0.5 MG tablet Take 0.5 mg by mouth at bedtime as needed.   No facility-administered encounter medications on file as of 10/11/2019.     Allergies  Allergen Reactions   Codeine Nausea  And Vomiting   Tramadol Nausea And Vomiting   Asa [Aspirin] Other (See Comments)    Patient is unaware of allergy   Azithromycin Other (See Comments)    Patient is unaware of allergy   Celebrex [Celecoxib] Other (See Comments)    Irritated stomach    Cymbalta [Duloxetine Hcl] Other (See Comments)    Felt groggy   Prednisone Rash    She has seen the podiatrist and he had injected cortisone in her feet and she had also taken a peel at home. She had a severe reaction to her face with a rash and had to take Benadryl to resolve the symptom. She actually did get more cortisone shots, but no additional reactions to the shots.   Vioxx [Rofecoxib] Other (See Comments)    Unknown   Zelnorm [Tegaserod] Other (See Comments)    Patient is unaware of allergy   Zocor [Simvastatin] Other (See Comments)    Patient is unaware of allergy    Review of Systems  Constitutional: Positive for fatigue (improving). Negative for activity change, appetite change, chills, diaphoresis, fever and unexpected weight change.  HENT: Negative.   Eyes: Negative.  Negative for photophobia and visual disturbance.  Respiratory: Negative for cough, chest tightness and shortness of breath.   Cardiovascular: Positive for leg swelling. Negative for chest pain and palpitations.  Gastrointestinal: Negative for abdominal distention, abdominal pain, anal bleeding, blood in stool, constipation, diarrhea, nausea, rectal pain and vomiting.  Endocrine: Negative.  Negative for polydipsia, polyphagia and polyuria.  Genitourinary: Negative for decreased urine volume, difficulty urinating, dysuria, frequency, hematuria and urgency.    Musculoskeletal: Positive for gait problem (using walker). Negative for arthralgias and myalgias.  Skin: Negative.        Reports improved color  Allergic/Immunologic: Negative.   Neurological: Positive for weakness (improving). Negative for dizziness and headaches.  Hematological: Negative.  Negative for adenopathy. Does not bruise/bleed easily.  Psychiatric/Behavioral: Negative for confusion, hallucinations, sleep disturbance and suicidal ideas.  All other systems reviewed and are negative.        Observations/Objective: No vital signs or physical exam, this was a telephone or virtual health encounter.  Pt alert and oriented, answers all questions appropriately, and able to speak in full sentences.    Assessment and Plan: Clary was seen today for anemia and hospitalization follow-up.  Diagnoses and all orders for this visit:  Hospital discharge follow-up Doing better since home. Has been exposed to COVID-19 but denies symptoms. Will get tested today or tomorrow. Educated on self quarantine measures.   Iron deficiency anemia due to chronic blood loss Has stopped Eliquis. Denies abnormal bleeding or bruising. Is regaining strength and color. Will recheck below.  -     CBC with Differential/Platelet; Future  Hypokalemia Is on repletion therapy. Will recheck.  -     CMP14+EGFR; Future  Mixed hyperlipidemia Compliant with medications and diet. Does not exercise. Diet and exercise encouraged.   Chronic systolic HF (heart failure) (HCC) Continue lasix therapy. Pt aware of symptoms that need to be reported. Pt aware to follow up with cardiology as scheduled.   PAF (paroxysmal atrial fibrillation) ([place]) Has stopped Eliquis due to recurrent anemia. Will continue to follow up with cardiology.   Depression, recurrent ([place]) GAD (generalized anxiety disorder) Doing well. Will continue Xanax 0.5 mg every night as needed for sleep and anxiety.  -     ALPRAZolam (XANAX) 0.5 MG  tablet; Take 1 tablet (0.5 mg total) by mouth at bedtime as needed  for anxiety or sleep.     Follow Up Instructions: Return in about 3 months (around 01/11/2020), or if symptoms worsen or fail to improve.    I discussed the assessment and treatment plan with the patient. The patient was provided an opportunity to ask questions and all were answered. The patient agreed with the plan and demonstrated an understanding of the instructions.   The patient was advised to call back or seek an in-person evaluation if the symptoms worsen or if the condition fails to improve as anticipated.  The above assessment and management plan was discussed with the patient. The patient verbalized understanding of and has agreed to the management plan. Patient is aware to call the clinic if they develop any new symptoms or if symptoms persist or worsen. Patient is aware when to return to the clinic for a follow-up visit. Patient educated on when it is appropriate to go to the emergency department.    I provided 25 minutes of non-face-to-face time during this encounter. The call started at 1035. The call ended at 1100. The other time was used for coordination of care.    Monia Pouch, FNP-C [facility] [address] [place], [place] [address] [phone] 10/11/2019

## 2019-10-12 ENCOUNTER — Other Ambulatory Visit: Payer: Self-pay | Admitting: *Deleted

## 2019-10-12 ENCOUNTER — Ambulatory Visit: Payer: Medicare Other | Admitting: Family Medicine

## 2019-10-12 ENCOUNTER — Telehealth: Payer: Self-pay | Admitting: Family Medicine

## 2019-10-12 DIAGNOSIS — Z20822 Contact with and (suspected) exposure to covid-19: Secondary | ICD-10-CM

## 2019-10-12 DIAGNOSIS — Z20828 Contact with and (suspected) exposure to other viral communicable diseases: Secondary | ICD-10-CM | POA: Diagnosis not present

## 2019-10-12 NOTE — Telephone Encounter (Signed)
Patient states that she went for Covid test this AM and they would not draw her blood because they said they didn't have a safe way of doing it. She just wanted you to know that she didn't have blood work done

## 2019-10-13 ENCOUNTER — Telehealth: Payer: Self-pay | Admitting: *Deleted

## 2019-10-13 ENCOUNTER — Telehealth: Payer: Self-pay | Admitting: Family Medicine

## 2019-10-13 LAB — NOVEL CORONAVIRUS, NAA: SARS-CoV-2, NAA: NOT DETECTED

## 2019-10-13 NOTE — Telephone Encounter (Signed)
Patient called for results ,results still pending advised to call back.

## 2019-10-13 NOTE — Telephone Encounter (Signed)
Calling for covid results; active, not resulted yet. Pt verbalizes understanding.

## 2019-10-15 ENCOUNTER — Telehealth: Payer: Self-pay | Admitting: Family Medicine

## 2019-10-15 NOTE — Telephone Encounter (Signed)
° °  Pt rec neg COVID results

## 2019-10-18 NOTE — Progress Notes (Signed)
Cardiology Office Note   Date:  10/20/2019   ID:  Misty [name], Misty [name] [birth year], MRN [id number]  PCP:  Baruch Gouty, FNP  Cardiologist:   Minus Breeding, MD Referring:  Baruch Gouty, FNP  Chief Complaint  Patient presents with   Atrial Fibrillation      History of Present Illness: Misty [name] is a 77 y.o. female who presents for follow-up after recent hospitalizations with GI bleed.  She has a history of cardiomyopathy.  She has mild nonobstructive coronary disease.  This was when she was in the hospital with respiratory failure.  She had pleural effusions. We have been managing her reduced ejection fraction medically.  She was in the hospital in August with anemia.   She had upper and lower endoscopy but they could not find a source.    I did review the GI records and actually sent a message to the hospitalist who cared for her.  She had some erosive esophagitis and a hiatal hernia that was not felt to be contributory to her anemia.  It was decided that she would go home off of iron pills but that she could go home on anticoagulation.  She was back in the hospital a few weeks later with  symptomatic anemia and required transfusion.  They attempted capsule endoscopy but this was incomplete.  There was no source of GI bleeding identified.  She was instructed to stay off of [place] until the 13th.  She restarted this.  After my follow up visit with her I check a follow up Hgb and her count was down again.  She has been taken off of her DOAC and has required blood transfusions.  She was anemic again after the last visit and went to the hospital to get transfusion.  She had a bleeding scan that was negative for source of bleeding.  Her Eliquis was held but she was told to restart it.  However, since I saw her she has been in the hospital twice once in October and once in November with bleeding requiring transfusion.  They cannot find a source.  However, I see mention that the capsule  endoscopy was incomplete.  Regardless given her severe and persistent blood loss anemia she is now off the Eliquis.  She has been weak.  She gets agitated because her benzodiazepines have been reduced and she thinks her heart rate goes faster than.  She gets around with a walker but in the wheelchair for the most part.  She is not having any chest pressure, neck or arm discomfort.  She had no acute shortness of breath, PND or orthopnea.   Past Medical History:  Diagnosis Date   Anxiety    takes Xanax daily   Arthritis    back   Atrial fibrillation with RVR (HCC) 03/25/2019   Cataract    Chronic back pain    Constipation    OTC stool softener prn   COPD (chronic obstructive pulmonary disease) (HCC)    Depression    Diverticulosis    GERD (gastroesophageal reflux disease)    takes Ranidine daily   Hemorrhoids    History of bronchitis    History of migraine    many yrs ago   History of shingles    Hyperlipidemia    Insomnia    Migraines    Osteoporosis    PONV (postoperative nausea and vomiting)     Past Surgical History:  Procedure Laterality Date   ABDOMINAL HYSTERECTOMY  BIOPSY  07/12/2019   Procedure: BIOPSY;  Surgeon: Rogene Houston, MD;  [facility];  Service: Endoscopy;;  duodenum   COLONOSCOPY     COLONOSCOPY N/A 02/17/2015   Procedure: COLONOSCOPY;  Surgeon: Rogene Houston, MD;  [facility];  Service: Endoscopy;  Laterality: N/A;  110n - moved to 11:15 - Ann to notify pt   COLONOSCOPY N/A 07/12/2019   Procedure: COLONOSCOPY;  Surgeon: Rogene Houston, MD;  [facility];  Service: Endoscopy;  Laterality: N/A;   ESOPHAGOGASTRODUODENOSCOPY     ESOPHAGOGASTRODUODENOSCOPY N/A 01/29/2016   Procedure: ESOPHAGOGASTRODUODENOSCOPY (EGD);  Surgeon: Rogene Houston, MD;  [facility];  Service: Endoscopy;  Laterality: N/A;   ESOPHAGOGASTRODUODENOSCOPY N/A 10/14/2017   Procedure: ESOPHAGOGASTRODUODENOSCOPY  (EGD);  Surgeon: Rogene Houston, MD;  [facility];  Service: Endoscopy;  Laterality: N/A;  730   ESOPHAGOGASTRODUODENOSCOPY N/A 07/12/2019   Procedure: ESOPHAGOGASTRODUODENOSCOPY (EGD);  Surgeon: Rogene Houston, MD;  [facility];  Service: Endoscopy;  Laterality: N/A;   ESOPHAGOGASTRODUODENOSCOPY N/A 10/03/2019   Procedure: ESOPHAGOGASTRODUODENOSCOPY (EGD);  Surgeon: Rogene Houston, MD;  [facility];  Service: Endoscopy;  Laterality: N/A;   EYE SURGERY Right 3/15   cataracts   GIVENS CAPSULE STUDY N/A 07/29/2019   Procedure: GIVENS CAPSULE STUDY;  Surgeon: Rogene Houston, MD;  [facility];  Service: Endoscopy;  Laterality: N/A;   GIVENS CAPSULE STUDY N/A 08/20/2019   Procedure: GIVENS CAPSULE STUDY;  Surgeon: Rogene Houston, MD;  [facility];  Service: Endoscopy;  Laterality: N/A;  [place] STUDY  10/03/2019   Procedure: GIVENS CAPSULE STUDY;  Surgeon: Rogene Houston, MD;  [facility];  Service: Endoscopy;;   INTRAMEDULLARY (IM) NAIL INTERTROCHANTERIC Left 01/07/2019   Procedure: INTRAMEDULLARY (IM) NAIL INTERTROCHANTRIC;  Surgeon: Carole Civil, MD;  Location: [facility];  Service: Orthopedics;  Laterality: Left;   KYPHOPLASTY N/A 07/08/2014   Procedure: Thoracic Eleven Kyphoplasty;  Surgeon: Hosie Spangle, MD;  Location: [facility];  Service: Neurosurgery;  Laterality: N/A;   left hip surgery     LUMBAR LAMINECTOMY/DECOMPRESSION MICRODISCECTOMY Bilateral 05/20/2013   Procedure: LUMBAR LAMINECTOMY/DECOMPRESSION MICRODISCECTOMY 1 LEVEL;  Surgeon: Hosie Spangle, MD;  Location: [facility];  Service: Neurosurgery;  Laterality: Bilateral;  Bilateral Lumbar four-five laminotomy and left lumbar four-five microdiskectomy   RIGHT/LEFT HEART CATH AND CORONARY ANGIOGRAPHY N/A 03/31/2019   Procedure: RIGHT/LEFT HEART CATH AND CORONARY ANGIOGRAPHY;  Surgeon: Sherren Mocha, MD;  Location: [facility];  Service: Cardiovascular;  Laterality: N/A;   SPINE SURGERY     Dr Carloyn Manner -  vertebraplasty     Current Outpatient Medications  Medication Sig Dispense Refill   acetaminophen (TYLENOL) 500 MG tablet Take 500 mg by mouth every 8 (eight) hours as needed for mild pain, moderate pain or headache.      albuterol (PROVENTIL HFA;VENTOLIN HFA) 108 (90 Base) MCG/ACT inhaler Inhale 2 puffs into the lungs every 6 (six) hours as needed for wheezing or shortness of breath (cough). 1 Inhaler 0   ALPRAZolam (XANAX) 0.5 MG tablet Take 1 tablet (0.5 mg total) by mouth at bedtime as needed for anxiety or sleep. 30 tablet 5   bisoprolol (ZEBETA) 5 MG tablet Take 0.5 tablets (2.5 mg total) by mouth daily. 30 tablet 6   cholecalciferol (VITAMIN D) 1000 UNITS tablet Take 2,000 Units by mouth daily.  escitalopram (LEXAPRO) 20 MG tablet TAKE ONE TABLET AT BEDTIME 30 tablet 3   furosemide (LASIX) 20 MG tablet Take 1 tablet (20 mg total) by mouth daily. 90 tablet 3   metoCLOPramide (REGLAN) 5 MG tablet Take 1 tablet (5 mg total) by mouth 3 (three) times daily before meals. 90 tablet 2   Multiple Vitamin (MULTIVITAMIN WITH MINERALS) TABS tablet Take 1 tablet by mouth daily. Centrum Silver     pantoprazole (PROTONIX) 40 MG tablet Take 1 tablet (40 mg total) by mouth 2 (two) timesdaily before a meal. 60 tablet 5   potassium chloride SA (K-DUR) 20 MEQ tablet Take 1 tablet (20 mEq total) by mouth daily for 30 days. 30 tablet 1   vitamin B-12 (CYANOCOBALAMIN) 1000 MCG tablet Take 1,000 mcg by mouth daily.     No current facility-administered medications for this visit.     ROS:  Please see the history of present illness.   Otherwise, review of systems are positive for insomnia, anxiety.   All other systems are reviewed and negative.    PHYSICAL EXAM: VS:  BP 113/66    Pulse 95    Ht 5' 7"  (1.702 m)    Wt 121 lb 12.8 oz (55.2 kg)    SpO2 96%    BMI 19.08 kg/m  , BMI Body mass index is  19.08 kg/m. GEN:  No distress, frail appearing NECK:  No jugular venous distention at 90 degrees, waveform within normal limits, carotid upstroke brisk and symmetric, no bruits, no thyromegaly LYMPHATICS:  No cervical adenopathy LUNGS:  Clear to auscultation bilaterally BACK:  No CVA tenderness CHEST:  Unremarkable HEART:  S1 and S2 within normal limits, no S3, no S4, no clicks, no rubs, no murmurs ABD:  Positive bowel sounds normal in frequency in pitch, no bruits, no rebound, no guarding, unable to assess midline mass or bruit with the patient seated. EXT:  2 plus pulses throughout, no edema, no cyanosis no clubbing SKIN:  No rashes no nodules NEURO:  Cranial nerves II through XII grossly intact, motor grossly intact throughout PSYCH:  Cognitively intact, oriented to person place and time    EKG:  EKG is not ordered today.   Recent Labs: 04/02/2019: Magnesium 2.1 07/09/2019: TSH 2.340 07/10/2019: B Natriuretic Peptide 365.0 10/01/2019: ALT 13; BUN 34; Creatinine, Ser 1.33; Potassium 4.5; Sodium 135 10/04/2019: Hemoglobin 7.8; Platelets 172    Lipid Panel    Component Value Date/Time   CHOL 125 07/09/2019 1256   TRIG 101 07/09/2019 1256   TRIG 72 01/09/2015 0948   HDL 45 07/09/2019 1256   HDL 52 01/09/2015 0948   CHOLHDL 2.8 07/09/2019 1256   CHOLHDL 2.7 03/26/2019 0224   VLDL 13 03/26/2019 0224   LDLCALC 60 07/09/2019 1256   LDLCALC 73 08/25/2014 0943      Wt Readings from Last 3 Encounters:  10/20/19 121 lb 12.8 oz (55.2 kg)  10/04/19 122 lb 12.7 oz (55.7 kg)  08/26/19 116 lb 13.5 oz (53 kg)      Other studies Reviewed: Additional studies/ records that were reviewed today include.  Hospital records  Review of the above records demonstrates:  See above    ASSESSMENT AND PLAN:  CARDIOMYOPATHY:      For now she seems to be euvolemic.  She is avoiding salt.  She is too frail to titrate meds further.  Her EF has been 30 to 35%.  No change in therapy.    MR:  This has been moderate  on echo.  This is moderate on echo.  I will continue to follow this with repeat echocardiography when I see her in the future.  ATRIAL FIB:    Misty [name] has a CHA2DS2 - VASc score of 5.    However, she cannot tolerate anticoagulation and has failed multiple attempts.  I think she is very frail but I did discuss the watchman device and will give her some information on this.  TOBACCO USE:   She cannot quit smoking.  We talked about this.   COVID EDUCATION: I talked to her about the vaccine.  She actually has not had the flu vaccine and I asked her to get this either at the drugstore to make an appointment.  Current medicines are reviewed at length with the patient today.  The patient does not have concerns regarding medicines.  The following changes have been made:  None  Labs/ tests ordered today include: None  No orders of the defined types were placed in this encounter.    Disposition:   FU with in 6 months.     Signed, Minus Breeding, MD  10/20/2019 1:54 PM    [facility]

## 2019-10-20 ENCOUNTER — Encounter: Payer: Self-pay | Admitting: Cardiology

## 2019-10-20 ENCOUNTER — Other Ambulatory Visit: Payer: Self-pay

## 2019-10-20 ENCOUNTER — Ambulatory Visit (INDEPENDENT_AMBULATORY_CARE_PROVIDER_SITE_OTHER): Payer: Medicare Other | Admitting: Cardiology

## 2019-10-20 ENCOUNTER — Telehealth: Payer: Self-pay | Admitting: Family Medicine

## 2019-10-20 VITALS — BP 113/66 | HR 95 | Ht 67.0 in | Wt 121.8 lb

## 2019-10-20 DIAGNOSIS — I5021 Acute systolic (congestive) heart failure: Secondary | ICD-10-CM

## 2019-10-20 DIAGNOSIS — I4891 Unspecified atrial fibrillation: Secondary | ICD-10-CM | POA: Diagnosis not present

## 2019-10-20 DIAGNOSIS — Z7189 Other specified counseling: Secondary | ICD-10-CM

## 2019-10-20 DIAGNOSIS — I420 Dilated cardiomyopathy: Secondary | ICD-10-CM

## 2019-10-20 NOTE — Patient Instructions (Signed)
Medication Instructions:  The current medical regimen is effective;  continue present plan and medications.  *If you need a refill on your cardiac medications before your next appointment, please call your pharmacy*  Follow-Up: At [facility], you and your health needs are our priority.  As part of our continuing mission to provide you with exceptional heart care, we have created designated Provider Care Teams.  These Care Teams include your primary Cardiologist (physician) and Advanced Practice Providers (APPs -  Physician Assistants and Nurse Practitioners) who all work together to provide you with the care you need, when you need it.  Your next appointment:   6 month(s)  The format for your next appointment:   In Person  Provider:   James Hochrein, MD  Thank you for choosing [facility]!!     

## 2019-10-20 NOTE — Telephone Encounter (Signed)
This has been discussed with her in her visits. We started tapering back a while back. It is unsafe for her to continue the previous doses of medication. This was initiated after her first hospital discharge several months back.

## 2019-10-20 NOTE — Telephone Encounter (Signed)
Patient is very upset she states she was never told it was going to one a day to 3. She states she can not go with out it She wants it sent back in to 3 a day she states it will be our fault if she has a hear attack. Explained to patient that xanax was not for heart. She got very upset and started yelling stating she knows its not for heart but if she goes with out it with her nerves it will cause a stroke or heart attack. Explained medication was not safe for her age could cause fall risk memory problems among others. Patient stated she did not care she wanted a call back from [facility] or her nurse she was not cutting her xanax back. She states no one ever told her they were cutting the meds back and she is demanding to speak with someone/ Asked for another provider in the practice and was advised that they more than likly would not prescribe as well. She states no one knows her history like Dr. Laurance Flatten and if it was not safe he would have never put her on it and she trust him. Please advise.

## 2019-10-20 NOTE — Telephone Encounter (Signed)
Continue Lexapro as prescribed. Long discussion about Xanax and proper use. Pt has been taking an unprescribed amount. Last RX was for 0.5 mg nightly as needed for anxiety. Pt has continued to take 0.5 mg three times daily. Explained to pt the adverse effects that can be caused by this medication and the improper use of this medication. Pt states she can not go without the medications. Pt informed the dose will slowly be tapered back until she is only taking at night as needed or off of it all together. Pt has bottle with her today that was filled on 06/23/2019, #90, only 2 pills left in bottle today. Pt advised the Xanax will be cut back to 0.5 mg twice daily as needed and then will be tapered down more. She is aware this will not be filled until 07/24/2019 when due. Pt does not wish to do this but agrees.    Above is note from 06/2019 visit. I will not add additional Xanax or increase dose. If she feels she needs to see psychiatry she can.

## 2019-10-20 NOTE — Telephone Encounter (Signed)
Patient was advised of the note. States she still was never told anything about cutting back xanax. Pt states she needs to find another doctor. Told patient that is her choice if she wish, but she would not get an increase of xanax. Patient then said ok and hung up.

## 2019-11-20 ENCOUNTER — Other Ambulatory Visit (INDEPENDENT_AMBULATORY_CARE_PROVIDER_SITE_OTHER): Payer: Self-pay | Admitting: Internal Medicine

## 2019-12-21 ENCOUNTER — Other Ambulatory Visit: Payer: Self-pay | Admitting: Family Medicine

## 2019-12-21 DIAGNOSIS — E876 Hypokalemia: Secondary | ICD-10-CM

## 2020-01-04 ENCOUNTER — Telehealth: Payer: Self-pay | Admitting: Family Medicine

## 2020-01-10 ENCOUNTER — Encounter: Payer: Self-pay | Admitting: Family Medicine

## 2020-01-10 ENCOUNTER — Ambulatory Visit (INDEPENDENT_AMBULATORY_CARE_PROVIDER_SITE_OTHER): Payer: Medicare Other | Admitting: Family Medicine

## 2020-01-10 DIAGNOSIS — D500 Iron deficiency anemia secondary to blood loss (chronic): Secondary | ICD-10-CM | POA: Diagnosis not present

## 2020-01-10 DIAGNOSIS — F411 Generalized anxiety disorder: Secondary | ICD-10-CM | POA: Diagnosis not present

## 2020-01-10 DIAGNOSIS — E559 Vitamin D deficiency, unspecified: Secondary | ICD-10-CM

## 2020-01-10 DIAGNOSIS — E876 Hypokalemia: Secondary | ICD-10-CM

## 2020-01-10 DIAGNOSIS — E782 Mixed hyperlipidemia: Secondary | ICD-10-CM

## 2020-01-10 DIAGNOSIS — F339 Major depressive disorder, recurrent, unspecified: Secondary | ICD-10-CM

## 2020-01-10 DIAGNOSIS — I700 Atherosclerosis of aorta: Secondary | ICD-10-CM | POA: Diagnosis not present

## 2020-01-10 DIAGNOSIS — I5022 Chronic systolic (congestive) heart failure: Secondary | ICD-10-CM

## 2020-01-10 DIAGNOSIS — I480 Paroxysmal atrial fibrillation: Secondary | ICD-10-CM

## 2020-01-10 NOTE — Progress Notes (Addendum)
Virtual Visit via telephone Note Due to COVID-19 pandemic this visit was conducted virtually. This visit type was conducted due to national recommendations for restrictions regarding the COVID-19 Pandemic (e.g. social distancing, sheltering in place) in an effort to limit this patient's exposure and mitigate transmission in our community. All issues noted in this document were discussed and addressed.  A physical exam was not performed with this format.   I connected with Misty [name] on 01/10/2020 at 0950 by telephone and verified that I am speaking with the correct person using two identifiers. [name] is currently located at home and family is currently with them during visit. The provider, Monia Pouch, FNP is located in their office at time of visit.  I discussed the limitations, risks, security and privacy concerns of performing an evaluation and management service by telephone and the availability of in person appointments. I also discussed with the patient that there may be a patient responsible charge related to this service. The patient expressed understanding and agreed to proceed.  Subjective:  Patient ID: Misty [name], female    DOB: 1940-11-19, 79 y.o.   MRN: [id number]  Chief Complaint:  Medical Management of Chronic Issues   HPI: Misty [name] is a 79 y.o. female presenting on 01/10/2020 for Medical Management of Chronic Issues  1. Depression, recurrent ([place]) 2. GAD (generalized anxiety disorder) Reports doing well over the last several weeks. States COVID-19 has caused some increased symptoms. She does feel better due to not being in the hospital recently.  GAD 7 : Generalized Anxiety Score 01/10/2020 10/11/2019  Nervous, Anxious, on Edge 2 1  Control/stop worrying 2 1  Worry too much - different things 2 1  Trouble relaxing 2 1  Restless 0 0  Easily annoyed or irritable 1 0  Afraid - awful might happen 1 0  Total GAD 7 Score 10 4    Depression  screen [facility] 2/9 01/10/2020 10/11/2019 07/20/2019 07/09/2019 04/16/2019  Decreased Interest 0 0 0 0 0  Down, Depressed, Hopeless 0 0 0 1 0  PHQ - 2 Score 0 0 0 1 0  Some recent data might be hidden     3. Iron deficiency anemia due to chronic blood loss Doing well since stopping Eliquis. No abnormal bleeding or bruising. No syncope, palpitations, or dizziness. No pallor. Is due for labs. Pt aware she will need to come into office to have completed.   4. Aortic atherosclerosis (HCC) Not on ASA or statin therapy. Does try to watch diet. Does not exercise on a regular. Basis. No chest pain, shortness of breath, dizziness, weakness, or syncope.   5. Paroxysmal atrial fibrillation (HCC) Doing well. Denies palpitations, chest pain, dizziness, syncope, or shortness of breath. Does follow with cardiology on a regular basis.   6. Chronic systolic HF (heart failure) (HCC) No increase swelling, dizziness, shortness of breath, syncope, PND, or orthopnea.   7. Mixed hyperlipidemia Not on statin therapy due to intolerance. Does try to watch diet. Does not exercise on a regular basis.   8. Hypokalemia On repletion therapy. No myalgias, palpitations, or muscle spasms.   9. Vitamin D deficiency Pt is taking oral repletion therapy. Denies bone pain and tenderness, muscle weakness, fracture, and difficulty walking. Lab Results  Component Value Date   VD25OH 38.8 07/09/2019   VD25OH 32.5 01/18/2019   VD25OH 41.3 12/30/2018   Lab Results  Component Value Date   CALCIUM 7.9 (L) 10/01/2019  Relevant past medical, surgical, family, and social history reviewed and updated as indicated.  Allergies and medications reviewed and updated.   Past Medical History:  Diagnosis Date  . Anxiety    takes Xanax daily  . Arthritis    back  . Atrial fibrillation with RVR ([place]) 03/25/2019  . Cataract   . Chronic back pain   . Constipation    OTC stool softener prn  . COPD (chronic obstructive pulmonary  disease) ([place])   . Depression   . Diverticulosis   . GERD (gastroesophageal reflux disease)    takes Ranidine daily  . Hemorrhoids   . History of bronchitis   . History of migraine    many yrs ago  . History of shingles   . Hyperlipidemia   . Insomnia   . Migraines   . Osteoporosis   . PONV (postoperative nausea and vomiting)     Past Surgical History:  Procedure Laterality Date  . ABDOMINAL HYSTERECTOMY    . BIOPSY  07/12/2019   Procedure: BIOPSY;  Surgeon: Rogene Houston, MD;  [facility];  Service: Endoscopy;;  duodenum  . COLONOSCOPY    . COLONOSCOPY N/A 02/17/2015   Procedure: COLONOSCOPY;  Surgeon: Rogene Houston, MD;  [facility];  Service: Endoscopy;  Laterality: N/A;  110n - moved to 11:15 - Ann to notify pt  . COLONOSCOPY N/A 07/12/2019   Procedure: COLONOSCOPY;  Surgeon: Rogene Houston, MD;  [facility];  Service: Endoscopy;  Laterality: N/A;  . ESOPHAGOGASTRODUODENOSCOPY    . ESOPHAGOGASTRODUODENOSCOPY N/A 01/29/2016   Procedure: ESOPHAGOGASTRODUODENOSCOPY (EGD);  Surgeon: Rogene Houston, MD;  [facility];  Service: Endoscopy;  Laterality: N/A;  . ESOPHAGOGASTRODUODENOSCOPY N/A 10/14/2017   Procedure: ESOPHAGOGASTRODUODENOSCOPY (EGD);  Surgeon: Rogene Houston, MD;  [facility];  Service: Endoscopy;  Laterality: N/A;  730  . ESOPHAGOGASTRODUODENOSCOPY N/A 07/12/2019   Procedure: ESOPHAGOGASTRODUODENOSCOPY (EGD);  Surgeon: Rogene Houston, MD;  [facility];  Service: Endoscopy;  Laterality: N/A;  . ESOPHAGOGASTRODUODENOSCOPY N/A 10/03/2019   Procedure: ESOPHAGOGASTRODUODENOSCOPY (EGD);  Surgeon: Rogene Houston, MD;  [facility];  Service: Endoscopy;  Laterality: N/A;  . EYE SURGERY Right 3/15   cataracts  . GIVENS CAPSULE STUDY N/A 07/29/2019   Procedure: GIVENS CAPSULE STUDY;  Surgeon: Rogene Houston, MD;  [facility];  Service: Endoscopy;  Laterality: N/A;  .  GIVENS CAPSULE STUDY N/A 08/20/2019   Procedure: GIVENS CAPSULE STUDY;  Surgeon: Rogene Houston, MD;  [facility];  Service: Endoscopy;  Laterality: N/A;  730am  . GIVENS CAPSULE STUDY  10/03/2019   Procedure: GIVENS CAPSULE STUDY;  Surgeon: Rogene Houston, MD;  [facility];  Service: Endoscopy;;  . INTRAMEDULLARY (IM) NAIL INTERTROCHANTERIC Left 01/07/2019   Procedure: INTRAMEDULLARY (IM) NAIL INTERTROCHANTRIC;  Surgeon: Carole Civil, MD;  Location: [facility];  Service: Orthopedics;  Laterality: Left;  . KYPHOPLASTY N/A 07/08/2014   Procedure: Thoracic Eleven Kyphoplasty;  Surgeon: Hosie Spangle, MD;  Location: [facility];  Service: Neurosurgery;  Laterality: N/A;  . left hip surgery    . LUMBAR LAMINECTOMY/DECOMPRESSION MICRODISCECTOMY Bilateral 05/20/2013   Procedure: LUMBAR LAMINECTOMY/DECOMPRESSION MICRODISCECTOMY 1 LEVEL;  Surgeon: Hosie Spangle, MD;  Location: [facility];  Service: Neurosurgery;  Laterality: Bilateral;  Bilateral Lumbar four-five laminotomy and left lumbar four-five microdiskectomy  . RIGHT/LEFT HEART CATH AND CORONARY ANGIOGRAPHY N/A 03/31/2019   Procedure: RIGHT/LEFT HEART CATH  AND CORONARY ANGIOGRAPHY;  Surgeon: Sherren Mocha, MD;  Location: [facility];  Service: Cardiovascular;  Laterality: N/A;  . SPINE SURGERY     Dr Carloyn Manner -  vertebraplasty    Social History   Socioeconomic History  . Marital status: Married    Spouse name: Sterling Big   . Number of children: Not on file  . Years of education: Not on file  . Highest education level: Not on file  Occupational History  . 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 Smoking status: Current Some Day Smoker    Packs/day: 1.00    Years: 52.00    Pack years: 52.00    Types: Cigarettes    Start date: 11/18/1958    Last attempt to quit: 08/20/2011    Years since quitting: 8.3  . Smokeless tobacco: Never Used  . Tobacco comment: 1/2-1pack  off and on all her life  Substance and Sexual Activity  . Alcohol use: No    Alcohol/week: 0.0 standard drinks  . Drug use: No  . Sexual activity: Not Currently  Other Topics Concern  . Not on file  Social History Narrative   Lives at home with husband, Sterling Big. Had one son- now deceased    Social Determinants of Health   Financial Resource Strain: Low Risk   . Difficulty of Paying Living Expenses: Not hard at all  Food Insecurity: No Food Insecurity  . Worried About Charity fundraiser in the Last Year: Never true  . Ran Out of Food in the Last Year: Never true  Transportation Needs: No Transportation Needs  . Lack of Transportation (Medical): No  . Lack of Transportation (Non-Medical): No  Physical Activity: Unknown  . Days of Exercise per Week: Patient refused  . Minutes of Exercise per Session: Patient refused  Stress: No Stress Concern Present  . Feeling of Stress : Only a little  Social Connections: Unknown  . Frequency of Communication with Friends and Family: More than three times a week  . Frequency of Social Gatherings with Friends and Family: Once a week  . Attends Religious Services: Patient refused  . Active Member of Clubs or Organizations: Patient refused  . Attends Archivist Meetings: Patient refused  . Marital Status: Married  Human resources officer Violence: Not At Risk  . Fear of Current or Ex-Partner: No  . Emotionally Abused: No  . Physically Abused: No  . Sexually Abused: No    Outpatient Encounter Medications as of 01/10/2020  Medication Sig  . acetaminophen (TYLENOL) 500 MG tablet Take 500 mg by mouth every 8 (eight) hours as needed for mild pain, moderate pain or headache.   . albuterol (PROVENTIL HFA;VENTOLIN HFA) 108 (90 Base) MCG/ACT inhaler Inhale 2 puffs into the lungs every 6 (six) hours as needed for wheezing or shortness of breath (cough).  . bisoprolol (ZEBETA) 5 MG tablet Take 0.5 tablets (2.5 mg total) by mouth daily.  .  cholecalciferol (VITAMIN D) 1000 UNITS tablet Take 2,000 Units by mouth daily.   Marland Kitchen escitalopram (LEXAPRO) 20 MG tablet TAKE ONE TABLET AT BEDTIME  . furosemide (LASIX) 20 MG tablet Take 1 tablet (20 mg total) by mouth daily.  . metoCLOPramide (REGLAN) 5 MG tablet Take 1 tablet (5 mg total) by mouth 3 (three) times daily before meals.  . Multiple Vitamin (MULTIVITAMIN WITH MINERALS) TABS tablet Take 1 tablet by mouth daily. Centrum Silver  . pantoprazole (  PROTONIX) 40 MG tablet Take 1 tablet (40 mg total) by mouth 2 (two) timesdaily before a meal.  . potassium chloride SA (KLOR-CON) 20 MEQ tablet Take 1 tablet (20 mEq total) by mouth daily  . vitamin B-12 (CYANOCOBALAMIN) 1000 MCG tablet Take 1,000 mcg by mouth daily.   No facility-administered encounter medications on file as of 01/10/2020.    Allergies  Allergen Reactions  . Codeine Nausea And Vomiting  . Tramadol Nausea And Vomiting  . Asa [Aspirin] Other (See Comments)    Patient is unaware of allergy  . Azithromycin Other (See Comments)    Patient is unaware of allergy  . Celebrex [Celecoxib] Other (See Comments)    Irritated stomach   . Cymbalta [Duloxetine Hcl] Other (See Comments)    Felt groggy  . Prednisone Rash    She has seen the podiatrist and he had injected cortisone in her feet and she had also taken a peel at home. She had a severe reaction to her face with a rash and had to take Benadryl to resolve the symptom. She actually did get more cortisone shots, but no additional reactions to the shots.  . Vioxx [Rofecoxib] Other (See Comments)    Unknown  . Zelnorm [Tegaserod] Other (See Comments)    Patient is unaware of allergy  . Zocor [Simvastatin] Other (See Comments)    Patient is unaware of allergy    Review of Systems  Constitutional: Negative for activity change, appetite change, chills, diaphoresis, fatigue, fever and unexpected weight change.  HENT: Negative.   Eyes: Negative.  Negative for photophobia and  visual disturbance.  Respiratory: Negative for cough, chest tightness and shortness of breath.   Cardiovascular: Negative for chest pain, palpitations and leg swelling.  Gastrointestinal: Negative for abdominal pain, anal bleeding, blood in stool, constipation, diarrhea, nausea and vomiting.  Endocrine: Negative.  Negative for cold intolerance, heat intolerance, polydipsia, polyphagia and polyuria.  Genitourinary: Negative for decreased urine volume, difficulty urinating, dysuria, frequency, hematuria and urgency.  Musculoskeletal: Negative for arthralgias and myalgias.  Skin: Negative.  Negative for pallor.  Allergic/Immunologic: Negative.   Neurological: Negative for dizziness, tremors, seizures, syncope, facial asymmetry, speech difficulty, weakness, light-headedness, numbness and headaches.  Hematological: Negative.   Psychiatric/Behavioral: Positive for agitation, decreased concentration, dysphoric mood and sleep disturbance. Negative for behavioral problems, confusion, hallucinations, self-injury and suicidal ideas. The patient is nervous/anxious. The patient is not hyperactive.   All other systems reviewed and are negative.        Observations/Objective: No vital signs or physical exam, this was a telephone or virtual health encounter.  Pt alert and oriented, answers all questions appropriately, and able to speak in full sentences.    Assessment and Plan: Niasia was seen today for medical management of chronic issues.  Diagnoses and all orders for this visit:  Depression, recurrent ([place]) GAD (generalized anxiety disorder) Doing well on current therapy. Will continue. Labs pending.  -     Thyroid Panel With TSH; Future  Iron deficiency anemia due to chronic blood loss No abnormal bleeding or bruising. Labs pending.  -     CBC with Differential/Platelet; Future  Aortic atherosclerosis ([place]) Not on statin therapy due to intolerance. Not on ASA therapy due to intolerance.  Diet and exercise encouraged. Labs pending.  -     CMP14+EGFR; Future  Paroxysmal atrial fibrillation (HCC) Well controlled. Continue to follow up with cardiology.  -     CBC with Differential/Platelet; Future  Chronic systolic HF (heart failure) ([place])  No new or worsening symptoms. Continue to follow up with cardiology.  -     CBC with Differential/Platelet; Future -     CMP14+EGFR; Future  Mixed hyperlipidemia Diet and exercise encouraged. Labs pending.  -     Lipid panel; Future  Hypokalemia On repletion therapy. Labs pending.  -     CMP14+EGFR; Future  Vitamin D deficiency Labs pending. Continue repletion therapy. If indicated, will change repletion dosage. Eat foods rich in Vit D including milk, orange juice, yogurt with vitamin D added, salmon or mackerel, canned tuna fish, cereals with vitamin D added, and cod liver oil. Get out in the sun but make sure to wear at least SPF 30 sunscreen.  -     VITAMIN D 25 Hydroxy (Vit-D Deficiency, Fractures); Future     Follow Up Instructions: Return in about 6 months (around 07/09/2020), or if symptoms worsen or fail to improve.    I discussed the assessment and treatment plan with the patient. The patient was provided an opportunity to ask questions and all were answered. The patient agreed with the plan and demonstrated an understanding of the instructions.   The patient was advised to call back or seek an in-person evaluation if the symptoms worsen or if the condition fails to improve as anticipated.  The above assessment and management plan was discussed with the patient. The patient verbalized understanding of and has agreed to the management plan. Patient is aware to call the clinic if they develop any new symptoms or if symptoms persist or worsen. Patient is aware when to return to the clinic for a follow-up visit. Patient educated on when it is appropriate to go to the emergency department.    I provided 25 minutes of  non-face-to-face time during this encounter. The call started at 0950. The call ended at 1030. The other time was used for coordination of care.    Monia Pouch, FNP-C [facility] [address] [place], [place] [address] [phone] 01/10/2020

## 2020-01-18 ENCOUNTER — Other Ambulatory Visit (INDEPENDENT_AMBULATORY_CARE_PROVIDER_SITE_OTHER): Payer: Self-pay | Admitting: Internal Medicine

## 2020-01-18 DIAGNOSIS — K2100 Gastro-esophageal reflux disease with esophagitis, without bleeding: Secondary | ICD-10-CM

## 2020-02-10 ENCOUNTER — Ambulatory Visit (INDEPENDENT_AMBULATORY_CARE_PROVIDER_SITE_OTHER): Payer: Medicare Other | Admitting: Family Medicine

## 2020-02-10 ENCOUNTER — Encounter: Payer: Self-pay | Admitting: Family Medicine

## 2020-02-10 DIAGNOSIS — K1370 Unspecified lesions of oral mucosa: Secondary | ICD-10-CM | POA: Diagnosis not present

## 2020-02-10 DIAGNOSIS — K143 Hypertrophy of tongue papillae: Secondary | ICD-10-CM

## 2020-02-10 MED ORDER — LIDOCAINE VISCOUS HCL 2 % MT SOLN: 15.0000 mL | Freq: Four times a day (QID) | OROMUCOSAL | 0 refills | Status: DC | PRN

## 2020-02-10 MED ORDER — NYSTATIN 100000 UNIT/ML MT SUSP: 5.0000 mL | Freq: Four times a day (QID) | OROMUCOSAL | 0 refills | Status: DC

## 2020-02-10 NOTE — Progress Notes (Signed)
Virtual Visit via telephone Note Due to COVID-19 pandemic this visit was conducted virtually. This visit type was conducted due to national recommendations for restrictions regarding the COVID-19 Pandemic (e.g. social distancing, sheltering in place) in an effort to limit this patient's exposure and mitigate transmission in our community. All issues noted in this document were discussed and addressed.  A physical exam was not performed with this format.   I connected with Misty [name] on 02/10/2020 at 0800 by telephone and verified that I am speaking with the correct person using two identifiers. Misty [name] is currently located at home and family is currently with them during visit. The provider, Monia Pouch, FNP is located in their office at time of visit.  I discussed the limitations, risks, security and privacy concerns of performing an evaluation and management service by telephone and the availability of in person appointments. I also discussed with the patient that there may be a patient responsible charge related to this service. The patient expressed understanding and agreed to proceed.  Subjective:  Patient ID: Misty [name], female    DOB: 17-Nov-1941, 79 y.o.   MRN: [id number]  Chief Complaint:  Mouth Lesions   HPI: Misty [name] is a 79 y.o. female presenting on 02/10/2020 for Mouth Lesions   Mouth Lesions  The current episode started 5 to 7 days ago. The onset was gradual. The problem occurs continuously. The problem has been gradually worsening. The problem is moderate. Nothing relieves the symptoms. The symptoms are aggravated by eating and drinking. Associated symptoms include mouth sores. Pertinent negatives include no orthopnea, no fever, no decreased vision, no double vision, no eye itching, no photophobia, no abdominal pain, no constipation, no diarrhea, no nausea, no vomiting, no congestion, no ear discharge, no ear pain, no headaches, no hearing loss, no  rhinorrhea, no stridor, no swollen glands, no muscle aches, no neck pain, no neck stiffness, no cough, no URI, no wheezing, no rash, no diaper rash, no eye discharge, no eye pain and no eye redness. She has been drinking less than usual and eating less than usual. Urine output has been normal. There were no sick contacts.     Relevant past medical, surgical, family, and social history reviewed and updated as indicated.  Allergies and medications reviewed and updated.   Past Medical History:  Diagnosis Date  . Anxiety    takes Xanax daily  . Arthritis    back  . Atrial fibrillation with RVR ([place]) 03/25/2019  . Cataract   . Chronic back pain   . Constipation    OTC stool softener prn  . COPD (chronic obstructive pulmonary disease) ([place])   . Depression   . Diverticulosis   . GERD (gastroesophageal reflux disease)    takes Ranidine daily  . Hemorrhoids   . History of bronchitis   . History of migraine    many yrs ago  . History of shingles   . Hyperlipidemia   . Insomnia   . Migraines   . Osteoporosis   . PONV (postoperative nausea and vomiting)     Past Surgical History:  Procedure Laterality Date  . ABDOMINAL HYSTERECTOMY    . BIOPSY  07/12/2019   Procedure: BIOPSY;  Surgeon: Rogene Houston, MD;  [facility];  Service: Endoscopy;;  duodenum  . COLONOSCOPY    . COLONOSCOPY N/A 02/17/2015   Procedure: COLONOSCOPY;  Surgeon: Rogene Houston, MD;  [facility];  Service: Endoscopy;  Laterality:  N/A;  110n - moved to 11:15 - Ann to notify pt  . COLONOSCOPY N/A 07/12/2019   Procedure: COLONOSCOPY;  Surgeon: Rogene Houston, MD;  [facility];  Service: Endoscopy;  Laterality: N/A;  . ESOPHAGOGASTRODUODENOSCOPY    . ESOPHAGOGASTRODUODENOSCOPY N/A 01/29/2016   Procedure: ESOPHAGOGASTRODUODENOSCOPY (EGD);  Surgeon: Rogene Houston, MD;  [facility];  Service: Endoscopy;  Laterality: N/A;  . ESOPHAGOGASTRODUODENOSCOPY N/A 10/14/2017    Procedure: ESOPHAGOGASTRODUODENOSCOPY (EGD);  Surgeon: Rogene Houston, MD;  [facility];  Service: Endoscopy;  Laterality: N/A;  730  . ESOPHAGOGASTRODUODENOSCOPY N/A 07/12/2019   Procedure: ESOPHAGOGASTRODUODENOSCOPY (EGD);  Surgeon: Rogene Houston, MD;  [facility];  Service: Endoscopy;  Laterality: N/A;  . ESOPHAGOGASTRODUODENOSCOPY N/A 10/03/2019   Procedure: ESOPHAGOGASTRODUODENOSCOPY (EGD);  Surgeon: Rogene Houston, MD;  [facility];  Service: Endoscopy;  Laterality: N/A;  . EYE SURGERY Right 3/15   cataracts  . GIVENS CAPSULE STUDY N/A 07/29/2019   Procedure: GIVENS CAPSULE STUDY;  Surgeon: Rogene Houston, MD;  [facility];  Service: Endoscopy;  Laterality: N/A;  . GIVENS CAPSULE STUDY N/A 08/20/2019   Procedure: GIVENS CAPSULE STUDY;  Surgeon: Rogene Houston, MD;  [facility];  Service: Endoscopy;  Laterality: N/A;  730am  . GIVENS CAPSULE STUDY  10/03/2019   Procedure: GIVENS CAPSULE STUDY;  Surgeon: Rogene Houston, MD;  [facility];  Service: Endoscopy;;  . INTRAMEDULLARY (IM) NAIL INTERTROCHANTERIC Left 01/07/2019   Procedure: INTRAMEDULLARY (IM) NAIL INTERTROCHANTRIC;  Surgeon: Carole Civil, MD;  Location: [facility];  Service: Orthopedics;  Laterality: Left;  . KYPHOPLASTY N/A 07/08/2014   Procedure: Thoracic Eleven Kyphoplasty;  Surgeon: Hosie Spangle, MD;  Location: [facility];  Service: Neurosurgery;  Laterality: N/A;  . left hip surgery    . LUMBAR LAMINECTOMY/DECOMPRESSION MICRODISCECTOMY Bilateral 05/20/2013   Procedure: LUMBAR LAMINECTOMY/DECOMPRESSION MICRODISCECTOMY 1 LEVEL;  Surgeon: Hosie Spangle, MD;  Location: [place] [facility];  Service: Neurosurgery;  Laterality: Bilateral;  Bilateral Lumbar four-five laminotomy and left lumbar four-five microdiskectomy  . RIGHT/LEFT HEART CATH AND CORONARY ANGIOGRAPHY N/A 03/31/2019   Procedure: RIGHT/LEFT HEART CATH AND CORONARY ANGIOGRAPHY;   Surgeon: Sherren Mocha, MD;  Location: [facility];  Service: Cardiovascular;  Laterality: N/A;  . SPINE SURGERY     Dr Carloyn Manner -  vertebraplasty    Social History   Socioeconomic History  . Marital status: Married    Spouse name: Sterling Big   . Number of children: Not on file  . Years of education: Not on file  . Highest education level: Not on file  Occupational History  . 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 Smoking status: Current Some Day Smoker    Packs/day: 1.00    Years: 52.00    Pack years: 52.00    Types: Cigarettes    Start date: 11/18/1958    Last attempt to quit: 08/20/2011    Years since quitting: 8.4  . Smokeless tobacco: Never Used  . Tobacco comment: 1/2-1pack off and on all her life  Substance and Sexual Activity  . Alcohol use: No    Alcohol/week: 0.0 standard drinks  . Drug use: No  . Sexual activity: Not Currently  Other Topics Concern  . Not on file  Social History Narrative   Lives at home with husband, Sterling Big. Had one son- now deceased  Social Determinants of Health   Financial Resource Strain: Low Risk   . Difficulty of Paying Living Expenses: Not hard at all  Food Insecurity: No Food Insecurity  . Worried About Charity fundraiser in the Last Year: Never true  . Ran Out of Food in the Last Year: Never true  Transportation Needs: No Transportation Needs  . Lack of Transportation (Medical): No  . Lack of Transportation (Non-Medical): No  Physical Activity: Unknown  . Days of Exercise per Week: Patient refused  . Minutes of Exercise per Session: Patient refused  Stress: No Stress Concern Present  . Feeling of Stress : Only a little  Social Connections: Unknown  . Frequency of Communication with Friends and Family: More than three times a week  . Frequency of Social Gatherings with Friends and Family: Once a week  . Attends Religious Services: Patient refused  . Active Member of Clubs or  Organizations: Patient refused  . Attends Archivist Meetings: Patient refused  . Marital Status: Married  Human resources officer Violence: Not At Risk  . Fear of Current or Ex-Partner: No  . Emotionally Abused: No  . Physically Abused: No  . Sexually Abused: No    Outpatient Encounter Medications as of 02/10/2020  Medication Sig  . acetaminophen (TYLENOL) 500 MG tablet Take 500 mg by mouth every 8 (eight) hours as needed for mild pain, moderate pain or headache.   . albuterol (PROVENTIL HFA;VENTOLIN HFA) 108 (90 Base) MCG/ACT inhaler Inhale 2 puffs into the lungs every 6 (six) hours as needed for wheezing or shortness of breath (cough).  . ALPRAZolam (XANAX) 0.5 MG tablet Take 0.5 mg by mouth at bedtime.  . bisoprolol (ZEBETA) 5 MG tablet Take 0.5 tablets (2.5 mg total) by mouth daily.  . cholecalciferol (VITAMIN D) 1000 UNITS tablet Take 2,000 Units by mouth daily.   Marland Kitchen escitalopram (LEXAPRO) 20 MG tablet TAKE ONE TABLET AT BEDTIME  . furosemide (LASIX) 20 MG tablet Take 1 tablet (20 mg total) by mouth daily.  Marland Kitchen lidocaine (XYLOCAINE) 2 % solution Use as directed 15 mLs in the mouth or throat every 6 (six) hours as needed for mouth pain. Nothing to eat or drink for 30 minutes after use  . metoCLOPramide (REGLAN) 5 MG tablet Take 1 tablet (5 mg total) by mouth 3 (three) times daily before meals.  . Multiple Vitamin (MULTIVITAMIN WITH MINERALS) TABS tablet Take 1 tablet by mouth daily. Centrum Silver  . nystatin (MYCOSTATIN) 100000 UNIT/ML suspension Take 5 mLs (500,000 Units total) by mouth 4 (four) times daily.  . pantoprazole (PROTONIX) 40 MG tablet Take 1 tablet (40 mg total) by mouth 2 (two) times daily before a meal.  . potassium chloride SA (KLOR-CON) 20 MEQ tablet Take 1 tablet (20 mEq total) by mouth daily  . vitamin B-12 (CYANOCOBALAMIN) 1000 MCG tablet Take 1,000 mcg by mouth daily.   No facility-administered encounter medications on file as of 02/10/2020.    Allergies    Allergen Reactions  . Codeine Nausea And Vomiting  . Tramadol Nausea And Vomiting  . Asa [Aspirin] Other (See Comments)    Patient is unaware of allergy  . Azithromycin Other (See Comments)    Patient is unaware of allergy  . Celebrex [Celecoxib] Other (See Comments)    Irritated stomach   . Cymbalta [Duloxetine Hcl] Other (See Comments)    Felt groggy  . Prednisone Rash    She has seen the podiatrist and he had injected cortisone in her  feet and she had also taken a peel at home. She had a severe reaction to her face with a rash and had to take Benadryl to resolve the symptom. She actually did get more cortisone shots, but no additional reactions to the shots.  . Vioxx [Rofecoxib] Other (See Comments)    Unknown  . Zelnorm [Tegaserod] Other (See Comments)    Patient is unaware of allergy  . Zocor [Simvastatin] Other (See Comments)    Patient is unaware of allergy    Review of Systems  Constitutional: Negative for activity change, appetite change, chills, diaphoresis, fatigue, fever and unexpected weight change.  HENT: Positive for mouth sores. Negative for congestion, ear discharge, ear pain, hearing loss and rhinorrhea.        White coating on tongue  Eyes: Negative.  Negative for double vision, photophobia, pain, discharge, redness and itching.  Respiratory: Negative for cough, chest tightness, shortness of breath, wheezing and stridor.   Cardiovascular: Negative for chest pain, palpitations, orthopnea and leg swelling.  Gastrointestinal: Negative for abdominal pain, blood in stool, constipation, diarrhea, nausea and vomiting.  Endocrine: Negative.   Genitourinary: Negative for decreased urine volume, difficulty urinating, dysuria, frequency and urgency.  Musculoskeletal: Negative for arthralgias, myalgias and neck pain.  Skin: Negative.  Negative for rash.  Allergic/Immunologic: Negative.   Neurological: Negative for dizziness, tremors, seizures, syncope, facial asymmetry,  speech difficulty, weakness, light-headedness, numbness and headaches.  Hematological: Negative.   Psychiatric/Behavioral: Negative for confusion, hallucinations, sleep disturbance and suicidal ideas.  All other systems reviewed and are negative.        Observations/Objective: No vital signs or physical exam, this was a telephone or virtual health encounter.  Pt alert and oriented, answers all questions appropriately, and able to speak in full sentences.    Assessment and Plan: Torryn was seen today for mouth lesions.  Diagnoses and all orders for this visit:  Oral mucosal lesion Tongue coating Reported symptoms consistent with thrush. Will treat with below. Symptomatic care discussed in detail. Medications as prescribed. Report any new, worsening, or continued symptoms.  -     nystatin (MYCOSTATIN) 100000 UNIT/ML suspension; Take 5 mLs (500,000 Units total) by mouth 4 (four) times daily. -     lidocaine (XYLOCAINE) 2 % solution; Use as directed 15 mLs in the mouth or throat every 6 (six) hours as needed for mouth pain. Nothing to eat or drink for 30 minutes after use     Follow Up Instructions: Return if symptoms worsen or fail to improve.    I discussed the assessment and treatment plan with the patient. The patient was provided an opportunity to ask questions and all were answered. The patient agreed with the plan and demonstrated an understanding of the instructions.   The patient was advised to call back or seek an in-person evaluation if the symptoms worsen or if the condition fails to improve as anticipated.  The above assessment and management plan was discussed with the patient. The patient verbalized understanding of and has agreed to the management plan. Patient is aware to call the clinic if they develop any new symptoms or if symptoms persist or worsen. Patient is aware when to return to the clinic for a follow-up visit. Patient educated on when it is appropriate to  go to the emergency department.    I provided 15 minutes of non-face-to-face time during this encounter. The call started at 0800. The call ended at 0815. The other time was used for coordination of care.  Monia Pouch, FNP-C [facility] [address] [place], [place] [address] [phone] 02/10/2020

## 2020-03-14 ENCOUNTER — Other Ambulatory Visit (INDEPENDENT_AMBULATORY_CARE_PROVIDER_SITE_OTHER): Payer: Self-pay | Admitting: Internal Medicine

## 2020-03-14 DIAGNOSIS — K2100 Gastro-esophageal reflux disease with esophagitis, without bleeding: Secondary | ICD-10-CM

## 2020-03-14 NOTE — Telephone Encounter (Signed)
Patient will need to have OV prior to running out of her RX . She was given 3 months.

## 2020-04-12 ENCOUNTER — Other Ambulatory Visit: Payer: Self-pay

## 2020-04-12 ENCOUNTER — Other Ambulatory Visit: Payer: Medicare Other

## 2020-04-12 DIAGNOSIS — E782 Mixed hyperlipidemia: Secondary | ICD-10-CM

## 2020-04-12 DIAGNOSIS — E7800 Pure hypercholesterolemia, unspecified: Secondary | ICD-10-CM

## 2020-04-12 DIAGNOSIS — D500 Iron deficiency anemia secondary to blood loss (chronic): Secondary | ICD-10-CM | POA: Diagnosis not present

## 2020-04-12 DIAGNOSIS — E559 Vitamin D deficiency, unspecified: Secondary | ICD-10-CM

## 2020-04-13 LAB — CBC WITH DIFFERENTIAL/PLATELET
Basophils Absolute: 0.1 10*3/uL (ref 0.0–0.2)
Basos: 1 %
EOS (ABSOLUTE): 0.9 10*3/uL — ABNORMAL HIGH (ref 0.0–0.4)
Eos: 9 %
Hematocrit: 25.9 % — ABNORMAL LOW (ref 34.0–46.6)
Hemoglobin: 7.4 g/dL — CL (ref 11.1–15.9)
Immature Grans (Abs): 0 10*3/uL (ref 0.0–0.1)
Immature Granulocytes: 0 %
Lymphocytes Absolute: 1.3 10*3/uL (ref 0.7–3.1)
Lymphs: 14 %
MCH: 21.5 pg — ABNORMAL LOW (ref 26.6–33.0)
MCHC: 28.6 g/dL — ABNORMAL LOW (ref 31.5–35.7)
MCV: 75 fL — ABNORMAL LOW (ref 79–97)
Monocytes Absolute: 0.8 10*3/uL (ref 0.1–0.9)
Monocytes: 9 %
Neutrophils Absolute: 6.4 10*3/uL (ref 1.4–7.0)
Neutrophils: 67 %
Platelets: 375 10*3/uL (ref 150–450)
RBC: 3.44 x10E6/uL — ABNORMAL LOW (ref 3.77–5.28)
RDW: 15.7 % — ABNORMAL HIGH (ref 11.7–15.4)
WBC: 9.6 10*3/uL (ref 3.4–10.8)

## 2020-04-13 LAB — CMP14+EGFR
ALT: 9 IU/L (ref 0–32)
AST: 14 IU/L (ref 0–40)
Albumin/Globulin Ratio: 1.5 (ref 1.2–2.2)
Albumin: 4.3 g/dL (ref 3.7–4.7)
Alkaline Phosphatase: 155 IU/L — ABNORMAL HIGH (ref 48–121)
BUN/Creatinine Ratio: 14 (ref 12–28)
BUN: 12 mg/dL (ref 8–27)
Bilirubin Total: <0.2 mg/dL (ref 0.0–1.2)
CO2: 23 mmol/L (ref 20–29)
Calcium: 9.3 mg/dL (ref 8.7–10.3)
Chloride: 99 mmol/L (ref 96–106)
Creatinine, Ser: 0.85 mg/dL (ref 0.57–1.00)
GFR calc Af Amer: 75 mL/min/{1.73_m2} (ref >59)
GFR calc non Af Amer: 65 mL/min/{1.73_m2} (ref >59)
Globulin, Total: 2.9 g/dL (ref 1.5–4.5)
Glucose: 109 mg/dL — ABNORMAL HIGH (ref 65–99)
Potassium: 4.6 mmol/L (ref 3.5–5.2)
Sodium: 137 mmol/L (ref 134–144)
Total Protein: 7.2 g/dL (ref 6.0–8.5)

## 2020-04-13 LAB — THYROID PANEL WITH TSH
Free Thyroxine Index: 2 (ref 1.2–4.9)
T3 Uptake Ratio: 31 % (ref 24–39)
T4, Total: 6.6 ug/dL (ref 4.5–12.0)
TSH: 1.25 u[IU]/mL (ref 0.450–4.500)

## 2020-04-13 LAB — LIPID PANEL
Chol/HDL Ratio: 2.5 ratio (ref 0.0–4.4)
Cholesterol, Total: 133 mg/dL (ref 100–199)
HDL: 54 mg/dL (ref >39)
LDL Chol Calc (NIH): 55 mg/dL (ref 0–99)
Triglycerides: 137 mg/dL (ref 0–149)
VLDL Cholesterol Cal: 24 mg/dL (ref 5–40)

## 2020-04-13 LAB — VITAMIN D 25 HYDROXY (VIT D DEFICIENCY, FRACTURES): Vit D, 25-Hydroxy: 38.5 ng/mL (ref 30.0–100.0)

## 2020-04-14 LAB — FERRITIN: Ferritin: 8 ng/mL — ABNORMAL LOW (ref 15–150)

## 2020-04-14 LAB — IRON AND TIBC
Iron Saturation: 3 % — CL (ref 15–55)
Iron: 13 ug/dL — ABNORMAL LOW (ref 27–139)
Total Iron Binding Capacity: 384 ug/dL (ref 250–450)
UIBC: 371 ug/dL — ABNORMAL HIGH (ref 118–369)

## 2020-04-14 LAB — SPECIMEN STATUS REPORT

## 2020-04-20 ENCOUNTER — Ambulatory Visit (INDEPENDENT_AMBULATORY_CARE_PROVIDER_SITE_OTHER): Payer: Medicare Other | Admitting: Family Medicine

## 2020-04-20 ENCOUNTER — Encounter: Payer: Self-pay | Admitting: Family Medicine

## 2020-04-20 ENCOUNTER — Encounter (INDEPENDENT_AMBULATORY_CARE_PROVIDER_SITE_OTHER): Payer: Self-pay

## 2020-04-20 ENCOUNTER — Other Ambulatory Visit: Payer: Self-pay

## 2020-04-20 VITALS — BP 134/64 | HR 75 | Temp 97.7°F | Ht 67.0 in | Wt 125.2 lb

## 2020-04-20 DIAGNOSIS — D62 Acute posthemorrhagic anemia: Secondary | ICD-10-CM | POA: Diagnosis not present

## 2020-04-20 DIAGNOSIS — E876 Hypokalemia: Secondary | ICD-10-CM | POA: Diagnosis not present

## 2020-04-20 DIAGNOSIS — M810 Age-related osteoporosis without current pathological fracture: Secondary | ICD-10-CM | POA: Diagnosis not present

## 2020-04-20 DIAGNOSIS — K1370 Unspecified lesions of oral mucosa: Secondary | ICD-10-CM

## 2020-04-20 DIAGNOSIS — K143 Hypertrophy of tongue papillae: Secondary | ICD-10-CM

## 2020-04-20 DIAGNOSIS — E782 Mixed hyperlipidemia: Secondary | ICD-10-CM

## 2020-04-20 LAB — CMP14+EGFR
ALT: 8 IU/L (ref 0–32)
AST: 14 IU/L (ref 0–40)
Albumin/Globulin Ratio: 1.5 (ref 1.2–2.2)
Albumin: 4.1 g/dL (ref 3.7–4.7)
Alkaline Phosphatase: 146 IU/L — ABNORMAL HIGH (ref 48–121)
BUN/Creatinine Ratio: 12 (ref 12–28)
BUN: 11 mg/dL (ref 8–27)
Bilirubin Total: 0.2 mg/dL (ref 0.0–1.2)
CO2: 24 mmol/L (ref 20–29)
Calcium: 9.7 mg/dL (ref 8.7–10.3)
Chloride: 102 mmol/L (ref 96–106)
Creatinine, Ser: 0.95 mg/dL (ref 0.57–1.00)
GFR calc Af Amer: 66 mL/min/{1.73_m2} (ref >59)
GFR calc non Af Amer: 57 mL/min/{1.73_m2} — ABNORMAL LOW (ref >59)
Globulin, Total: 2.7 g/dL (ref 1.5–4.5)
Glucose: 108 mg/dL — ABNORMAL HIGH (ref 65–99)
Potassium: 4.7 mmol/L (ref 3.5–5.2)
Sodium: 139 mmol/L (ref 134–144)
Total Protein: 6.8 g/dL (ref 6.0–8.5)

## 2020-04-20 LAB — CBC WITH DIFFERENTIAL/PLATELET
Basophils Absolute: 0.1 10*3/uL (ref 0.0–0.2)
Basos: 1 %
EOS (ABSOLUTE): 0.7 10*3/uL — ABNORMAL HIGH (ref 0.0–0.4)
Eos: 7 %
Hematocrit: 26.4 % — ABNORMAL LOW (ref 34.0–46.6)
Hemoglobin: 7.4 g/dL — CL (ref 11.1–15.9)
Immature Grans (Abs): 0 10*3/uL (ref 0.0–0.1)
Immature Granulocytes: 0 %
Lymphocytes Absolute: 0.8 10*3/uL (ref 0.7–3.1)
Lymphs: 9 %
MCH: 20.8 pg — ABNORMAL LOW (ref 26.6–33.0)
MCHC: 28 g/dL — ABNORMAL LOW (ref 31.5–35.7)
MCV: 74 fL — ABNORMAL LOW (ref 79–97)
Monocytes Absolute: 0.7 10*3/uL (ref 0.1–0.9)
Monocytes: 7 %
Neutrophils Absolute: 7.1 10*3/uL — ABNORMAL HIGH (ref 1.4–7.0)
Neutrophils: 76 %
Platelets: 328 10*3/uL (ref 150–450)
RBC: 3.56 x10E6/uL — ABNORMAL LOW (ref 3.77–5.28)
RDW: 15.9 % — ABNORMAL HIGH (ref 11.7–15.4)
WBC: 9.4 10*3/uL (ref 3.4–10.8)

## 2020-04-20 MED ORDER — ESCITALOPRAM OXALATE 20 MG PO TABS: 20.0000 mg | ORAL_TABLET | Freq: Every day | ORAL | 5 refills | Status: DC

## 2020-04-20 MED ORDER — ALPRAZOLAM 0.5 MG PO TABS: 0.5000 mg | ORAL_TABLET | Freq: Every day | ORAL | 2 refills | Status: DC

## 2020-04-20 MED ORDER — POTASSIUM CHLORIDE CRYS ER 20 MEQ PO TBCR: EXTENDED_RELEASE_TABLET | ORAL | 0 refills | Status: DC

## 2020-04-20 MED ORDER — ALBUTEROL SULFATE HFA 108 (90 BASE) MCG/ACT IN AERS: 2.0000 | INHALATION_SPRAY | Freq: Four times a day (QID) | RESPIRATORY_TRACT | 11 refills | Status: DC | PRN

## 2020-04-20 MED ORDER — FUROSEMIDE 20 MG PO TABS: 20.0000 mg | ORAL_TABLET | Freq: Every day | ORAL | 3 refills | Status: DC

## 2020-04-20 MED ORDER — BISOPROLOL FUMARATE 5 MG PO TABS: 2.5000 mg | ORAL_TABLET | Freq: Every day | ORAL | 6 refills | Status: DC

## 2020-04-20 MED ORDER — LIDOCAINE VISCOUS HCL 2 % MT SOLN: 15.0000 mL | Freq: Four times a day (QID) | OROMUCOSAL | 0 refills | Status: DC | PRN

## 2020-04-23 ENCOUNTER — Encounter: Payer: Self-pay | Admitting: Family Medicine

## 2020-04-23 NOTE — Progress Notes (Signed)
Subjective:  Patient ID: Misty [name], female    DOB: [birth year]  Age: 79 y.o. MRN: [id number]  CC: Medication Refill   HPI Misty [name] presents for frustration over being taken off of her Xanax.  She cannot sleep she is up every 2 hours all night long.  She is upset with her provider Ms. Thayer Ohm.  "She made me mad.  She made the decision without my agreement.  "  Patient has had anemia before and received transfusions.  She does not understand why she had to go and wait for several hours last time.  Depression screen [facility] 2/9 04/20/2020 01/10/2020 10/11/2019  Decreased Interest 0 0 0  Down, Depressed, Hopeless 0 0 0  PHQ - 2 Score 0 0 0  Some recent data might be hidden    History Misty [name] has a past medical history of Anxiety, Arthritis, Atrial fibrillation with RVR ([place]) (03/25/2019), Cataract, Chronic back pain, Constipation, COPD (chronic obstructive pulmonary disease) ([place]), Depression, Diverticulosis, GERD (gastroesophageal reflux disease), Hemorrhoids, History of bronchitis, History of migraine, History of shingles, Hyperlipidemia, Insomnia, Migraines, Osteoporosis, and PONV (postoperative nausea and vomiting).   She has a past surgical history that includes Abdominal hysterectomy; Esophagogastroduodenoscopy; Colonoscopy; Lumbar laminectomy/decompression microdiscectomy (Bilateral, 05/20/2013); Eye surgery (Right, 3/15); Kyphoplasty (N/A, 07/08/2014); Colonoscopy (N/A, 02/17/2015); Esophagogastroduodenoscopy (N/A, 01/29/2016); Esophagogastroduodenoscopy (N/A, 10/14/2017); Spine surgery; Intramedullary (im) nail intertrochanteric (Left, 01/07/2019); RIGHT/LEFT HEART CATH AND CORONARY ANGIOGRAPHY (N/A, 03/31/2019); left hip surgery; Colonoscopy (N/A, 07/12/2019); Esophagogastroduodenoscopy (N/A, 07/12/2019); biopsy (07/12/2019); Givens capsule study (N/A, 07/29/2019); Givens capsule study (N/A, 08/20/2019); Esophagogastroduodenoscopy (N/A, 10/03/2019); and Givens capsule study (10/03/2019).   Her  family history includes Cirrhosis in her son; Heart attack in her mother; Heart disease in her father and son; Hypertension in her mother; Macular degeneration in her father.She reports that she has been smoking cigarettes. She started smoking about 61 years ago. She has a 52.00 pack-year smoking history. She has never used smokeless tobacco. She reports that she does not drink alcohol or use drugs.    ROS Review of Systems  Constitutional: Negative.   HENT: Negative.   Eyes: Negative for visual disturbance.  Respiratory: Negative for shortness of breath.   Cardiovascular: Negative for chest pain.  Gastrointestinal: Negative for abdominal pain.  Musculoskeletal: Negative for arthralgias.    Objective:  BP 134/64    Pulse 75    Temp 97.7 F (36.5 C) (Temporal)    Ht _0  (1.702 m)    Wt 125 lb 3.2 oz (56.8 kg)    BMI 19.61 kg/m   BP Readings from Last 3 Encounters:  04/20/20 134/64  10/20/19 113/66  10/04/19 121/62    Wt Readings from Last 3 Encounters:  04/20/20 125 lb 3.2 oz (56.8 kg)  10/20/19 121 lb 12.8 oz (55.2 kg)  10/04/19 122 lb 12.7 oz (55.7 kg)     Physical Exam Constitutional:      General: She is not in acute distress.    Appearance: She is well-developed.  HENT:     [name] Normocephalic and atraumatic.  Eyes:     Conjunctiva/sclera: Conjunctivae normal.     Pupils: Pupils are equal, round, and reactive to light.  Neck:     Thyroid: No thyromegaly.  Cardiovascular:     Rate and Rhythm: Normal rate and regular rhythm.     Heart sounds: Normal heart sounds. No murmur.  Pulmonary:     Effort: Pulmonary effort is normal. No respiratory distress.     Breath sounds: Normal breath  sounds. No wheezing or rales.  Abdominal:     General: Bowel sounds are normal. There is no distension.     Palpations: Abdomen is soft.     Tenderness: There is no abdominal tenderness.  Musculoskeletal:        General: Normal range of motion.     Cervical back: Normal range of  motion and neck supple.  Lymphadenopathy:     Cervical: No cervical adenopathy.  Skin:    General: Skin is warm and dry.  Neurological:     Mental Status: She is alert and oriented to person, place, and time.  Psychiatric:        Behavior: Behavior normal.        Thought Content: Thought content normal.        Judgment: Judgment normal.       Assessment & Plan:   Rether was seen today for medication refill.  Diagnoses and all orders for this visit:  Osteoporosis, postmenopausal -     CBC with Differential/Platelet -     CMP14+EGFR  Oral mucosal lesion -     lidocaine (XYLOCAINE) 2 % solution; Use as directed 15 mLs in the mouth or throat every 6 (six) hours as needed for mouth pain. Nothing to eat or drink for 30 minutes after use -     CBC with Differential/Platelet -     CMP14+EGFR  Tongue coating -     lidocaine (XYLOCAINE) 2 % solution; Use as directed 15 mLs in the mouth or throat every 6 (six) hours as needed for mouth pain. Nothing to eat or drink for 30 minutes after use -     CBC with Differential/Platelet -     CMP14+EGFR  Hypokalemia -     potassium chloride SA (KLOR-CON) 20 MEQ tablet; Take 1 tablet (20 mEq total) by mouth daily -     CBC with Differential/Platelet -     CMP14+EGFR  Mixed hyperlipidemia -     CBC with Differential/Platelet -     CMP14+EGFR  Acute blood loss anemia -     CBC with Differential/Platelet -     CMP14+EGFR -     Transfuse RBC; Future  Other orders -     ALPRAZolam (XANAX) 0.5 MG tablet; Take 1 tablet (0.5 mg total) by mouth at bedtime. -     albuterol (VENTOLIN HFA) 108 (90 Base) MCG/ACT inhaler; Inhale 2 puffs into the lungs every 6 (six) hours as needed for wheezing or shortness of breath (cough). -     bisoprolol (ZEBETA) 5 MG tablet; Take 0.5 tablets (2.5 mg total) by mouth daily. -     escitalopram (LEXAPRO) 20 MG tablet; Take 1 tablet (20 mg total) by mouth at bedtime. -     furosemide (LASIX) 20 MG tablet; Take 1  tablet (20 mg total) by mouth daily.       I have changed Misty [name] "Yvonne"'s ALPRAZolam, albuterol, and escitalopram. I am also having her maintain her cholecalciferol, acetaminophen, vitamin B-12, multivitamin with minerals, metoCLOPramide, nystatin, pantoprazole, bisoprolol, furosemide, lidocaine, and potassium chloride SA.  Allergies as of 04/20/2020      Reactions   Codeine Nausea And Vomiting   Tramadol Nausea And Vomiting   Asa [aspirin] Other (See Comments)   Patient is unaware of allergy   Azithromycin Other (See Comments)   Patient is unaware of allergy   Celebrex [celecoxib] Other (See Comments)   Irritated stomach   Cymbalta [duloxetine Hcl] Other (See Comments)  Felt groggy   Prednisone Rash   She has seen the podiatrist and he had injected cortisone in her feet and she had also taken a peel at home. She had a severe reaction to her face with a rash and had to take Benadryl to resolve the symptom. She actually did get more cortisone shots, but no additional reactions to the shots.   Vioxx [rofecoxib] Other (See Comments)   Unknown   Zelnorm [tegaserod] Other (See Comments)   Patient is unaware of allergy   Zocor [simvastatin] Other (See Comments)   Patient is unaware of allergy      Medication List       Accurate as of April 20, 2020 11:59 PM. If you have any questions, ask your nurse or doctor.        acetaminophen 500 MG tablet Commonly known as: TYLENOL Take 500 mg by mouth every 8 (eight) hours as needed for mild pain, moderate pain or headache.   albuterol 108 (90 Base) MCG/ACT inhaler Commonly known as: VENTOLIN HFA Inhale 2 puffs into the lungs every 6 (six) hours as needed for wheezing or shortness of breath (cough).   ALPRAZolam 0.5 MG tablet Commonly known as: XANAX Take 1 tablet (0.5 mg total) by mouth at bedtime.   bisoprolol 5 MG tablet Commonly known as: ZEBETA Take 0.5 tablets (2.5 mg total) by mouth daily.   cholecalciferol 1000  units tablet Commonly known as: VITAMIN D Take 2,000 Units by mouth daily.   escitalopram 20 MG tablet Commonly known as: LEXAPRO Take 1 tablet (20 mg total) by mouth at bedtime.   furosemide 20 MG tablet Commonly known as: LASIX Take 1 tablet (20 mg total) by mouth daily.   lidocaine 2 % solution Commonly known as: XYLOCAINE Use as directed 15 mLs in the mouth or throat every 6 (six) hours as needed for mouth pain. Nothing to eat or drink for 30 minutes after use   metoCLOPramide 5 MG tablet Commonly known as: Reglan Take 1 tablet (5 mg total) by mouth 3 (three) times daily before meals.   multivitamin with minerals Tabs tablet Take 1 tablet by mouth daily. Centrum Silver   nystatin 100000 UNIT/ML suspension Commonly known as: MYCOSTATIN Take 5 mLs (500,000 Units total) by mouth 4 (four) times daily.   pantoprazole 40 MG tablet Commonly known as: PROTONIX TAKE 1 TABLET 2 TIMES A DAY   potassium chloride SA 20 MEQ tablet Commonly known as: KLOR-CON Take 1 tablet (20 mEq total) by mouth daily   vitamin B-12 1000 MCG tablet Commonly known as: CYANOCOBALAMIN Take 1,000 mcg by mouth daily.        Follow-up: Return in about 1 month (around 05/20/2020).  Claretta Fraise, M.D.

## 2020-05-19 ENCOUNTER — Other Ambulatory Visit: Payer: Self-pay | Admitting: Cardiology

## 2020-06-19 ENCOUNTER — Ambulatory Visit (INDEPENDENT_AMBULATORY_CARE_PROVIDER_SITE_OTHER): Payer: Medicare Other | Admitting: Gastroenterology

## 2020-07-20 ENCOUNTER — Encounter: Payer: Self-pay | Admitting: Family Medicine

## 2020-07-20 ENCOUNTER — Ambulatory Visit (INDEPENDENT_AMBULATORY_CARE_PROVIDER_SITE_OTHER): Payer: Medicare Other | Admitting: Family Medicine

## 2020-07-20 ENCOUNTER — Other Ambulatory Visit: Payer: Self-pay

## 2020-07-20 VITALS — BP 103/56 | HR 74 | Temp 97.2°F | Resp 20 | Ht 67.0 in | Wt 123.0 lb

## 2020-07-20 DIAGNOSIS — K2100 Gastro-esophageal reflux disease with esophagitis, without bleeding: Secondary | ICD-10-CM

## 2020-07-20 DIAGNOSIS — F411 Generalized anxiety disorder: Secondary | ICD-10-CM

## 2020-07-20 DIAGNOSIS — Z79899 Other long term (current) drug therapy: Secondary | ICD-10-CM

## 2020-07-20 DIAGNOSIS — E876 Hypokalemia: Secondary | ICD-10-CM | POA: Diagnosis not present

## 2020-07-20 DIAGNOSIS — E559 Vitamin D deficiency, unspecified: Secondary | ICD-10-CM

## 2020-07-20 MED ORDER — ALPRAZOLAM 0.5 MG PO TABS: 0.5000 mg | ORAL_TABLET | Freq: Two times a day (BID) | ORAL | 2 refills | Status: DC

## 2020-07-20 MED ORDER — POTASSIUM CHLORIDE CRYS ER 20 MEQ PO TBCR: EXTENDED_RELEASE_TABLET | ORAL | 5 refills | Status: DC

## 2020-07-20 MED ORDER — TRAZODONE HCL 150 MG PO TABS: ORAL_TABLET | ORAL | 5 refills | Status: DC

## 2020-07-21 LAB — CBC WITH DIFFERENTIAL/PLATELET
Basophils Absolute: 0.1 10*3/uL (ref 0.0–0.2)
Basos: 1 %
EOS (ABSOLUTE): 1 10*3/uL — ABNORMAL HIGH (ref 0.0–0.4)
Eos: 9 %
Hematocrit: 32.3 % — ABNORMAL LOW (ref 34.0–46.6)
Hemoglobin: 9.7 g/dL — ABNORMAL LOW (ref 11.1–15.9)
Immature Grans (Abs): 0.1 10*3/uL (ref 0.0–0.1)
Immature Granulocytes: 1 %
Lymphocytes Absolute: 1.1 10*3/uL (ref 0.7–3.1)
Lymphs: 10 %
MCH: 24.7 pg — ABNORMAL LOW (ref 26.6–33.0)
MCHC: 30 g/dL — ABNORMAL LOW (ref 31.5–35.7)
MCV: 82 fL (ref 79–97)
Monocytes Absolute: 1.1 10*3/uL — ABNORMAL HIGH (ref 0.1–0.9)
Monocytes: 10 %
Neutrophils Absolute: 7.7 10*3/uL — ABNORMAL HIGH (ref 1.4–7.0)
Neutrophils: 69 %
Platelets: 302 10*3/uL (ref 150–450)
RBC: 3.92 x10E6/uL (ref 3.77–5.28)
RDW: 17.6 % — ABNORMAL HIGH (ref 11.7–15.4)
WBC: 11 10*3/uL — ABNORMAL HIGH (ref 3.4–10.8)

## 2020-07-21 LAB — CMP14+EGFR
ALT: 9 IU/L (ref 0–32)
AST: 14 IU/L (ref 0–40)
Albumin/Globulin Ratio: 1.4 (ref 1.2–2.2)
Albumin: 4.2 g/dL (ref 3.7–4.7)
Alkaline Phosphatase: 147 IU/L — ABNORMAL HIGH (ref 48–121)
BUN/Creatinine Ratio: 15 (ref 12–28)
BUN: 17 mg/dL (ref 8–27)
Bilirubin Total: <0.2 mg/dL (ref 0.0–1.2)
CO2: 25 mmol/L (ref 20–29)
Calcium: 9.3 mg/dL (ref 8.7–10.3)
Chloride: 97 mmol/L (ref 96–106)
Creatinine, Ser: 1.13 mg/dL — ABNORMAL HIGH (ref 0.57–1.00)
GFR calc Af Amer: 53 mL/min/{1.73_m2} — ABNORMAL LOW (ref >59)
GFR calc non Af Amer: 46 mL/min/{1.73_m2} — ABNORMAL LOW (ref >59)
Globulin, Total: 3.1 g/dL (ref 1.5–4.5)
Glucose: 116 mg/dL — ABNORMAL HIGH (ref 65–99)
Potassium: 4.6 mmol/L (ref 3.5–5.2)
Sodium: 138 mmol/L (ref 134–144)
Total Protein: 7.3 g/dL (ref 6.0–8.5)

## 2020-07-22 LAB — TOXASSURE SELECT 13 (MW), URINE

## 2020-07-23 ENCOUNTER — Encounter: Payer: Self-pay | Admitting: Family Medicine

## 2020-07-23 NOTE — Progress Notes (Signed)
Subjective:  Patient ID: Misty [name], female    DOB: [birth year]  Age: 79 y.o. MRN: [id number]  CC: Medical Management of Chronic Issues   HPI Misty [name] presents for Patient in for follow-up of GERD. Currently asymptomatic taking  PPI daily. There is no chest pain or heartburn. No hematemesis and no melena. No dysphagia or choking. Onset is remote. Progression is stable. Complicating factors, none. Has also had recent problems with hypokalemia.  Patient suffers from generalized anxiety disorder for which she has been taking long-term benzodiazepines. She is seen today for refill of that medication. She has been taking this for 20 years or more. Dose has not changed recently. PDMP review shows that she has been taking the same dose since December of last year, 1 at bedtime. Previously she had been taking 2 daily. She has no discrepancy with regard to her use of the same provider and pharmacy without any outside prescriptions.   GAD 7 : Generalized Anxiety Score 04/20/2020 01/10/2020 10/11/2019  Nervous, Anxious, on Edge _0 Control/stop worrying 0 2 1  Worry too much - different things _1 Trouble relaxing 0 2 1  Restless 0 0 0  Easily annoyed or irritable 0 1 0  Afraid - awful might happen 0 1 0  Total GAD 7 Score _2 Anxiety Difficulty Not difficult at all - -     Depression screen [facility] 2/9 07/20/2020 04/20/2020 01/10/2020  Decreased Interest 0 0 0  Down, Depressed, Hopeless 0 0 0  PHQ - 2 Score 0 0 0  Some recent data might be hidden    History Misty [name] has a past medical history of Anxiety, Arthritis, Atrial fibrillation with RVR ([place]) (03/25/2019), Cataract, Chronic back pain, Colitis, Constipation, COPD (chronic obstructive pulmonary disease) (HCC), Depression, Diverticulosis, GERD (gastroesophageal reflux disease), Hemorrhoids, History of bronchitis, History of migraine, History of shingles, Hyperlipidemia, Insomnia, Migraines, Nontraumatic compression fracture of  T11 vertebra (HCC) (07/08/2014), Osteoporosis, and PONV (postoperative nausea and vomiting).   She has a past surgical history that includes Abdominal hysterectomy; Esophagogastroduodenoscopy; Colonoscopy; Lumbar laminectomy/decompression microdiscectomy (Bilateral, 05/20/2013); Eye surgery (Right, 3/15); Kyphoplasty (N/A, 07/08/2014); Colonoscopy (N/A, 02/17/2015); Esophagogastroduodenoscopy (N/A, 01/29/2016); Esophagogastroduodenoscopy (N/A, 10/14/2017); Spine surgery; Intramedullary (im) nail intertrochanteric (Left, 01/07/2019); RIGHT/LEFT HEART CATH AND CORONARY ANGIOGRAPHY (N/A, 03/31/2019); left hip surgery; Colonoscopy (N/A, 07/12/2019); Esophagogastroduodenoscopy (N/A, 07/12/2019); biopsy (07/12/2019); Givens capsule study (N/A, 07/29/2019); Givens capsule study (N/A, 08/20/2019); Esophagogastroduodenoscopy (N/A, 10/03/2019); and Givens capsule study (10/03/2019).   Her family history includes Cirrhosis in her son; Heart attack in her mother; Heart disease in her father and son; Hypertension in her mother; Macular degeneration in her father.She reports that she has been smoking cigarettes. She started smoking about 61 years ago. She has a 52.00 pack-year smoking history. She has never used smokeless tobacco. She reports that she does not drink alcohol and does not use drugs.    ROS Review of Systems  Constitutional: Negative.   HENT: Negative for congestion.   Eyes: Negative for visual disturbance.  Respiratory: Negative for shortness of breath.   Cardiovascular: Negative for chest pain.  Gastrointestinal: Negative for abdominal pain, constipation, diarrhea, nausea and vomiting.  Genitourinary: Negative for difficulty urinating.  Musculoskeletal: Negative for arthralgias and myalgias.  Neurological: Negative for headaches.  Psychiatric/Behavioral: Negative for sleep disturbance.    Objective:  BP (!) 103/56   Pulse 74   Temp (!) 97.2 F (36.2 C) (Temporal)   Resp 20   Ht 5'  7" (1.702 m)   Wt  123 lb (55.8 kg)   SpO2 92%   BMI 19.26 kg/m   BP Readings from Last 3 Encounters:  07/20/20 (!) 103/56  04/20/20 134/64  10/20/19 113/66    Wt Readings from Last 3 Encounters:  07/20/20 123 lb (55.8 kg)  04/20/20 125 lb 3.2 oz (56.8 kg)  10/20/19 121 lb 12.8 oz (55.2 kg)     Physical Exam Constitutional:      General: She is not in acute distress.    Appearance: She is well-developed.  HENT:     [name] Normocephalic and atraumatic.  Eyes:     Conjunctiva/sclera: Conjunctivae normal.     Pupils: Pupils are equal, round, and reactive to light.  Neck:     Thyroid: No thyromegaly.  Cardiovascular:     Rate and Rhythm: Normal rate and regular rhythm.     Heart sounds: Normal heart sounds. No murmur heard.   Pulmonary:     Effort: Pulmonary effort is normal. No respiratory distress.     Breath sounds: Normal breath sounds. No wheezing or rales.  Abdominal:     General: Bowel sounds are normal. There is no distension.     Palpations: Abdomen is soft.     Tenderness: There is no abdominal tenderness.  Musculoskeletal:        General: Normal range of motion.     Cervical back: Normal range of motion and neck supple.  Lymphadenopathy:     Cervical: No cervical adenopathy.  Skin:    General: Skin is warm and dry.  Neurological:     Mental Status: She is alert and oriented to person, place, and time.  Psychiatric:        Behavior: Behavior normal.        Thought Content: Thought content normal.        Judgment: Judgment normal.       Assessment & Plan:   Misty [name] was seen today for medical management of chronic issues.  Diagnoses and all orders for this visit:  Gastroesophageal reflux disease with esophagitis without hemorrhage -     CBC with Differential/Platelet -     CMP14+EGFR  Hypokalemia -     potassium chloride SA (KLOR-CON) 20 MEQ tablet; Take 1 tablet (20 mEq total) by mouth daily -     CBC with Differential/Platelet -     CMP14+EGFR  Vitamin D  deficiency -     CBC with Differential/Platelet -     CMP14+EGFR  GAD (generalized anxiety disorder) -     CBC with Differential/Platelet -     CMP14+EGFR  Controlled substance agreement signed -     CBC with Differential/Platelet -     CMP14+EGFR -     ToxASSURE Select 13 (MW), Urine  Other orders -     ALPRAZolam (XANAX) 0.5 MG tablet; Take 1 tablet (0.5 mg total) by mouth 2 (two) times daily. -     traZODone (DESYREL) 150 MG tablet; Use from 1/3 to 1 tablet nightly as needed for sleep.       I have changed Misty [name] "Yvonne"'s ALPRAZolam. I am also having her start on traZODone. Additionally, I am having her maintain her cholecalciferol, acetaminophen, vitamin B-12, multivitamin with minerals, metoCLOPramide, nystatin, pantoprazole, albuterol, escitalopram, furosemide, lidocaine, bisoprolol, and potassium chloride SA.  Allergies as of 07/20/2020      Reactions   Codeine Nausea And Vomiting   Tramadol Nausea And Vomiting   Asa [aspirin] Other (  See Comments)   Patient is unaware of allergy   Azithromycin Other (See Comments)   Patient is unaware of allergy   Celebrex [celecoxib] Other (See Comments)   Irritated stomach   Cymbalta [duloxetine Hcl] Other (See Comments)   Felt groggy   Prednisone Rash   She has seen the podiatrist and he had injected cortisone in her feet and she had also taken a peel at home. She had a severe reaction to her face with a rash and had to take Benadryl to resolve the symptom. She actually did get more cortisone shots, but no additional reactions to the shots.   Vioxx [rofecoxib] Other (See Comments)   Unknown   Zelnorm [tegaserod] Other (See Comments)   Patient is unaware of allergy   Zocor [simvastatin] Other (See Comments)   Patient is unaware of allergy      Medication List       Accurate as of July 20, 2020 11:59 PM. If you have any questions, ask your nurse or doctor.        acetaminophen 500 MG tablet Commonly known  as: TYLENOL Take 500 mg by mouth every 8 (eight) hours as needed for mild pain, moderate pain or headache.   albuterol 108 (90 Base) MCG/ACT inhaler Commonly known as: VENTOLIN HFA Inhale 2 puffs into the lungs every 6 (six) hours as needed for wheezing or shortness of breath (cough).   ALPRAZolam 0.5 MG tablet Commonly known as: XANAX Take 1 tablet (0.5 mg total) by mouth 2 (two) times daily. What changed: when to take this Changed by: Claretta Fraise, MD   bisoprolol 5 MG tablet Commonly known as: ZEBETA Take 1 tablet (5 mg total) by mouth daily.   cholecalciferol 1000 units tablet Commonly known as: VITAMIN D Take 2,000 Units by mouth daily.   escitalopram 20 MG tablet Commonly known as: LEXAPRO Take 1 tablet (20 mg total) by mouth at bedtime.   furosemide 20 MG tablet Commonly known as: LASIX Take 1 tablet (20 mg total) by mouth daily.   lidocaine 2 % solution Commonly known as: XYLOCAINE Use as directed 15 mLs in the mouth or throat every 6 (six) hours as needed for mouth pain. Nothing to eat or drink for 30 minutes after use   metoCLOPramide 5 MG tablet Commonly known as: Reglan Take 1 tablet (5 mg total) by mouth 3 (three) times daily before meals.   multivitamin with minerals Tabs tablet Take 1 tablet by mouth daily. Centrum Silver   nystatin 100000 UNIT/ML suspension Commonly known as: MYCOSTATIN Take 5 mLs (500,000 Units total) by mouth 4 (four) times daily.   pantoprazole 40 MG tablet Commonly known as: PROTONIX TAKE 1 TABLET 2 TIMES A DAY   potassium chloride SA 20 MEQ tablet Commonly known as: KLOR-CON Take 1 tablet (20 mEq total) by mouth daily   traZODone 150 MG tablet Commonly known as: DESYREL Use from 1/3 to 1 tablet nightly as needed for sleep. Started by: Claretta Fraise, MD   vitamin B-12 1000 MCG tablet Commonly known as: CYANOCOBALAMIN Take 1,000 mcg by mouth daily.        Follow-up: No follow-ups on file.  Claretta Fraise, M.D.

## 2020-09-18 ENCOUNTER — Other Ambulatory Visit (INDEPENDENT_AMBULATORY_CARE_PROVIDER_SITE_OTHER): Payer: Self-pay | Admitting: Internal Medicine

## 2020-09-18 DIAGNOSIS — K2100 Gastro-esophageal reflux disease with esophagitis, without bleeding: Secondary | ICD-10-CM

## 2020-10-26 ENCOUNTER — Ambulatory Visit (INDEPENDENT_AMBULATORY_CARE_PROVIDER_SITE_OTHER): Payer: Medicare Other | Admitting: Family Medicine

## 2020-10-26 ENCOUNTER — Encounter: Payer: Self-pay | Admitting: Family Medicine

## 2020-10-26 ENCOUNTER — Other Ambulatory Visit: Payer: Self-pay

## 2020-10-26 VITALS — BP 114/60 | HR 90 | Temp 97.6°F | Ht 67.0 in | Wt 122.8 lb

## 2020-10-26 DIAGNOSIS — F411 Generalized anxiety disorder: Secondary | ICD-10-CM | POA: Diagnosis not present

## 2020-10-26 DIAGNOSIS — K2100 Gastro-esophageal reflux disease with esophagitis, without bleeding: Secondary | ICD-10-CM | POA: Diagnosis not present

## 2020-10-26 DIAGNOSIS — Z1159 Encounter for screening for other viral diseases: Secondary | ICD-10-CM | POA: Diagnosis not present

## 2020-10-26 MED ORDER — PANTOPRAZOLE SODIUM 40 MG PO TBEC: 40.0000 mg | DELAYED_RELEASE_TABLET | Freq: Two times a day (BID) | ORAL | 3 refills | Status: DC

## 2020-10-26 MED ORDER — ALPRAZOLAM 0.5 MG PO TABS: 0.5000 mg | ORAL_TABLET | Freq: Two times a day (BID) | ORAL | 2 refills | Status: DC

## 2020-10-27 LAB — CBC WITH DIFFERENTIAL/PLATELET
Basophils Absolute: 0 10*3/uL (ref 0.0–0.2)
Basos: 0 %
EOS (ABSOLUTE): 0.3 10*3/uL (ref 0.0–0.4)
Eos: 3 %
Hematocrit: 23.6 % — ABNORMAL LOW (ref 34.0–46.6)
Hemoglobin: 6.6 g/dL — CL (ref 11.1–15.9)
Immature Grans (Abs): 0.1 10*3/uL (ref 0.0–0.1)
Immature Granulocytes: 1 %
Lymphocytes Absolute: 0.7 10*3/uL (ref 0.7–3.1)
Lymphs: 8 %
MCH: 21.2 pg — ABNORMAL LOW (ref 26.6–33.0)
MCHC: 28 g/dL — ABNORMAL LOW (ref 31.5–35.7)
MCV: 76 fL — ABNORMAL LOW (ref 79–97)
Monocytes Absolute: 0.6 10*3/uL (ref 0.1–0.9)
Monocytes: 7 %
Neutrophils Absolute: 6.9 10*3/uL (ref 1.4–7.0)
Neutrophils: 81 %
Platelets: 345 10*3/uL (ref 150–450)
RBC: 3.11 x10E6/uL — ABNORMAL LOW (ref 3.77–5.28)
RDW: 18.2 % — ABNORMAL HIGH (ref 11.7–15.4)
WBC: 8.6 10*3/uL (ref 3.4–10.8)

## 2020-10-27 LAB — CMP14+EGFR
ALT: 6 IU/L (ref 0–32)
AST: 8 IU/L (ref 0–40)
Albumin/Globulin Ratio: 1.2 (ref 1.2–2.2)
Albumin: 3.6 g/dL — ABNORMAL LOW (ref 3.7–4.7)
Alkaline Phosphatase: 128 IU/L — ABNORMAL HIGH (ref 44–121)
BUN/Creatinine Ratio: 15 (ref 12–28)
BUN: 14 mg/dL (ref 8–27)
Bilirubin Total: <0.2 mg/dL (ref 0.0–1.2)
CO2: 23 mmol/L (ref 20–29)
Calcium: 9 mg/dL (ref 8.7–10.3)
Chloride: 100 mmol/L (ref 96–106)
Creatinine, Ser: 0.92 mg/dL (ref 0.57–1.00)
GFR calc Af Amer: 68 mL/min/{1.73_m2} (ref >59)
GFR calc non Af Amer: 59 mL/min/{1.73_m2} — ABNORMAL LOW (ref >59)
Globulin, Total: 3 g/dL (ref 1.5–4.5)
Glucose: 110 mg/dL — ABNORMAL HIGH (ref 65–99)
Potassium: 4.4 mmol/L (ref 3.5–5.2)
Sodium: 142 mmol/L (ref 134–144)
Total Protein: 6.6 g/dL (ref 6.0–8.5)

## 2020-10-27 LAB — HEPATITIS C ANTIBODY: Hep C Virus Ab: <0.1 s/co ratio (ref 0.0–0.9)

## 2020-10-29 ENCOUNTER — Encounter: Payer: Self-pay | Admitting: Family Medicine

## 2020-10-29 NOTE — Progress Notes (Signed)
Subjective:  Patient ID: Misty [name], female    DOB: [birth year]  Age: 79 y.o. MRN: [id number]  CC: Follow-up (3 month/)   HPI Misty [name] presents for Patient in for follow-up of GERD. Currently asymptomatic taking  PPI daily. There is no chest pain or heartburn. No hematemesis and no melena. No dysphagia or choking. Onset is remote. Progression is stable. Complicating factors, none.  GAD 7 : Generalized Anxiety Score 04/20/2020 01/10/2020 10/11/2019  Nervous, Anxious, on Edge _0 Control/stop worrying 0 2 1  Worry too much - different things _1 Trouble relaxing 0 2 1  Restless 0 0 0  Easily annoyed or irritable 0 1 0  Afraid - awful might happen 0 1 0  Total GAD 7 Score _2 Anxiety Difficulty Not difficult at all - -       Depression screen [facility] 2/9 10/26/2020 07/20/2020 04/20/2020  Decreased Interest 0 0 0  Down, Depressed, Hopeless 0 0 0  PHQ - 2 Score 0 0 0  Some recent data might be hidden    History Misty [name] has a past medical history of Anxiety, Arthritis, Atrial fibrillation with RVR ([place]) (03/25/2019), Cataract, Chronic back pain, Colitis, Constipation, COPD (chronic obstructive pulmonary disease) (HCC), Depression, Diverticulosis, GERD (gastroesophageal reflux disease), Hemorrhoids, History of bronchitis, History of migraine, History of shingles, Hyperlipidemia, Insomnia, Migraines, Nontraumatic compression fracture of T11 vertebra (HCC) (07/08/2014), Osteoporosis, and PONV (postoperative nausea and vomiting).   She has a past surgical history that includes Abdominal hysterectomy; Esophagogastroduodenoscopy; Colonoscopy; Lumbar laminectomy/decompression microdiscectomy (Bilateral, 05/20/2013); Eye surgery (Right, 3/15); Kyphoplasty (N/A, 07/08/2014); Colonoscopy (N/A, 02/17/2015); Esophagogastroduodenoscopy (N/A, 01/29/2016); Esophagogastroduodenoscopy (N/A, 10/14/2017); Spine surgery; Intramedullary (im) nail intertrochanteric (Left, 01/07/2019); RIGHT/LEFT HEART CATH  AND CORONARY ANGIOGRAPHY (N/A, 03/31/2019); left hip surgery; Colonoscopy (N/A, 07/12/2019); Esophagogastroduodenoscopy (N/A, 07/12/2019); biopsy (07/12/2019); Givens capsule study (N/A, 07/29/2019); Givens capsule study (N/A, 08/20/2019); Esophagogastroduodenoscopy (N/A, 10/03/2019); and Givens capsule study (10/03/2019).   Her family history includes Cirrhosis in her son; Heart attack in her mother; Heart disease in her father and son; Hypertension in her mother; Macular degeneration in her father.She reports that she has been smoking cigarettes. She started smoking about 61 years ago. She has a 52.00 pack-year smoking history. She has never used smokeless tobacco. She reports that she does not drink alcohol and does not use drugs.    ROS Review of Systems  Constitutional: Negative.   HENT: Negative.   Eyes: Negative for visual disturbance.  Respiratory: Negative for shortness of breath.   Cardiovascular: Negative for chest pain.  Gastrointestinal: Negative for abdominal pain.  Musculoskeletal: Negative for arthralgias.    Objective:  BP 114/60   Pulse 90   Temp 97.6 F (36.4 C) (Temporal)   Ht _3  (1.702 m)   Wt 122 lb 12.8 oz (55.7 kg)   BMI 19.23 kg/m   BP Readings from Last 3 Encounters:  10/26/20 114/60  07/20/20 (!) 103/56  04/20/20 134/64    Wt Readings from Last 3 Encounters:  10/26/20 122 lb 12.8 oz (55.7 kg)  07/20/20 123 lb (55.8 kg)  04/20/20 125 lb 3.2 oz (56.8 kg)     Physical Exam Constitutional:      General: She is not in acute distress.    Appearance: She is well-developed and well-nourished.  HENT:     [name] Normocephalic and atraumatic.  Eyes:     Conjunctiva/sclera: Conjunctivae normal.     Pupils: Pupils are equal, round, and reactive  to light.  Neck:     Thyroid: No thyromegaly.  Cardiovascular:     Rate and Rhythm: Normal rate and regular rhythm.     Heart sounds: Normal heart sounds. No murmur heard.   Pulmonary:     Effort: Pulmonary  effort is normal. No respiratory distress.     Breath sounds: Normal breath sounds. No wheezing or rales.  Abdominal:     General: Bowel sounds are normal. There is no distension.     Palpations: Abdomen is soft.     Tenderness: There is no abdominal tenderness.  Musculoskeletal:        General: Normal range of motion.     Cervical back: Normal range of motion and neck supple.  Lymphadenopathy:     Cervical: No cervical adenopathy.  Skin:    General: Skin is warm and dry.  Neurological:     Mental Status: She is alert and oriented to person, place, and time.  Psychiatric:        Mood and Affect: Mood and affect normal.        Behavior: Behavior normal.        Thought Content: Thought content normal.        Judgment: Judgment normal.       Assessment & Plan:   Misty [name] was seen today for follow-up.  Diagnoses and all orders for this visit:  Gastroesophageal reflux disease with esophagitis -     pantoprazole (PROTONIX) 40 MG tablet; Take 1 tablet (40 mg total) by mouth 2 (two) times daily. -     CBC with Differential/Platelet -     CMP14+EGFR  Need for hepatitis C screening test -     Hepatitis C antibody  GAD (generalized anxiety disorder)  Other orders -     ALPRAZolam (XANAX) 0.5 MG tablet; Take 1 tablet (0.5 mg total) by mouth 2 (two) times daily.       I have changed Misty [name] "Misty [name]"'s pantoprazole. I am also having her maintain her cholecalciferol, acetaminophen, vitamin B-12, multivitamin with minerals, metoCLOPramide, nystatin, albuterol, escitalopram, furosemide, lidocaine, bisoprolol, potassium chloride SA, traZODone, and ALPRAZolam.  Allergies as of 10/26/2020      Reactions   Codeine Nausea And Vomiting   Tramadol Nausea And Vomiting   Asa [aspirin] Other (See Comments)   Patient is unaware of allergy   Azithromycin Other (See Comments)   Patient is unaware of allergy   Celebrex [celecoxib] Other (See Comments)   Irritated stomach    Cymbalta [duloxetine Hcl] Other (See Comments)   Felt groggy   Prednisone Rash   She has seen the podiatrist and he had injected cortisone in her feet and she had also taken a peel at home. She had a severe reaction to her face with a rash and had to take Benadryl to resolve the symptom. She actually did get more cortisone shots, but no additional reactions to the shots.   Vioxx [rofecoxib] Other (See Comments)   Unknown   Zelnorm [tegaserod] Other (See Comments)   Patient is unaware of allergy   Zocor [simvastatin] Other (See Comments)   Patient is unaware of allergy      Medication List       Accurate as of October 26, 2020 11:59 PM. If you have any questions, ask your nurse or doctor.        acetaminophen 500 MG tablet Commonly known as: TYLENOL Take 500 mg by mouth every 8 (eight) hours as needed for mild pain,  moderate pain or headache.   albuterol 108 (90 Base) MCG/ACT inhaler Commonly known as: VENTOLIN HFA Inhale 2 puffs into the lungs every 6 (six) hours as needed for wheezing or shortness of breath (cough).   ALPRAZolam 0.5 MG tablet Commonly known as: XANAX Take 1 tablet (0.5 mg total) by mouth 2 (two) times daily.   bisoprolol 5 MG tablet Commonly known as: ZEBETA Take 1 tablet (5 mg total) by mouth daily.   cholecalciferol 1000 units tablet Commonly known as: VITAMIN D Take 2,000 Units by mouth daily.   escitalopram 20 MG tablet Commonly known as: LEXAPRO Take 1 tablet (20 mg total) by mouth at bedtime.   furosemide 20 MG tablet Commonly known as: LASIX Take 1 tablet (20 mg total) by mouth daily.   lidocaine 2 % solution Commonly known as: XYLOCAINE Use as directed 15 mLs in the mouth or throat every 6 (six) hours as needed for mouth pain. Nothing to eat or drink for 30 minutes after use   metoCLOPramide 5 MG tablet Commonly known as: Reglan Take 1 tablet (5 mg total) by mouth 3 (three) times daily before meals.   multivitamin with minerals Tabs  tablet Take 1 tablet by mouth daily. Centrum Silver   nystatin 100000 UNIT/ML suspension Commonly known as: MYCOSTATIN Take 5 mLs (500,000 Units total) by mouth 4 (four) times daily.   pantoprazole 40 MG tablet Commonly known as: PROTONIX Take 1 tablet (40 mg total) by mouth 2 (two) times daily.   potassium chloride SA 20 MEQ tablet Commonly known as: KLOR-CON Take 1 tablet (20 mEq total) by mouth daily   traZODone 150 MG tablet Commonly known as: DESYREL Use from 1/3 to 1 tablet nightly as needed for sleep.   vitamin B-12 1000 MCG tablet Commonly known as: CYANOCOBALAMIN Take 1,000 mcg by mouth daily.        Follow-up: No follow-ups on file.  Claretta Fraise, M.D.

## 2020-10-30 ENCOUNTER — Inpatient Hospital Stay (HOSPITAL_COMMUNITY)
Admission: EM | Admit: 2020-10-30 | Discharge: 2020-11-01 | DRG: 391 | Disposition: A | Discharge status: Home / Self Care | Payer: Medicare Other | Attending: Family Medicine | Admitting: Family Medicine

## 2020-10-30 ENCOUNTER — Encounter (HOSPITAL_COMMUNITY): Payer: Self-pay | Admitting: Emergency Medicine

## 2020-10-30 ENCOUNTER — Emergency Department (HOSPITAL_COMMUNITY): Payer: Medicare Other

## 2020-10-30 ENCOUNTER — Other Ambulatory Visit: Payer: Self-pay

## 2020-10-30 ENCOUNTER — Telehealth: Payer: Self-pay

## 2020-10-30 DIAGNOSIS — K922 Gastrointestinal hemorrhage, unspecified: Secondary | ICD-10-CM

## 2020-10-30 DIAGNOSIS — Z90710 Acquired absence of both cervix and uterus: Secondary | ICD-10-CM

## 2020-10-30 DIAGNOSIS — I5022 Chronic systolic (congestive) heart failure: Secondary | ICD-10-CM | POA: Diagnosis not present

## 2020-10-30 DIAGNOSIS — D509 Iron deficiency anemia, unspecified: Secondary | ICD-10-CM | POA: Diagnosis present

## 2020-10-30 DIAGNOSIS — Z9111 Patient's noncompliance with dietary regimen: Secondary | ICD-10-CM

## 2020-10-30 DIAGNOSIS — I340 Nonrheumatic mitral (valve) insufficiency: Secondary | ICD-10-CM | POA: Diagnosis not present

## 2020-10-30 DIAGNOSIS — K6389 Other specified diseases of intestine: Secondary | ICD-10-CM | POA: Diagnosis not present

## 2020-10-30 DIAGNOSIS — K3189 Other diseases of stomach and duodenum: Secondary | ICD-10-CM | POA: Diagnosis not present

## 2020-10-30 DIAGNOSIS — I739 Peripheral vascular disease, unspecified: Secondary | ICD-10-CM | POA: Diagnosis not present

## 2020-10-30 DIAGNOSIS — F339 Major depressive disorder, recurrent, unspecified: Secondary | ICD-10-CM | POA: Diagnosis present

## 2020-10-30 DIAGNOSIS — T182XXA Foreign body in stomach, initial encounter: Secondary | ICD-10-CM | POA: Diagnosis not present

## 2020-10-30 DIAGNOSIS — K900 Celiac disease: Secondary | ICD-10-CM | POA: Diagnosis present

## 2020-10-30 DIAGNOSIS — J440 Chronic obstructive pulmonary disease with acute lower respiratory infection: Secondary | ICD-10-CM | POA: Diagnosis not present

## 2020-10-30 DIAGNOSIS — I480 Paroxysmal atrial fibrillation: Secondary | ICD-10-CM | POA: Diagnosis not present

## 2020-10-30 DIAGNOSIS — J189 Pneumonia, unspecified organism: Secondary | ICD-10-CM | POA: Diagnosis present

## 2020-10-30 DIAGNOSIS — J8410 Pulmonary fibrosis, unspecified: Secondary | ICD-10-CM | POA: Diagnosis present

## 2020-10-30 DIAGNOSIS — E785 Hyperlipidemia, unspecified: Secondary | ICD-10-CM | POA: Diagnosis not present

## 2020-10-30 DIAGNOSIS — D649 Anemia, unspecified: Secondary | ICD-10-CM | POA: Diagnosis present

## 2020-10-30 DIAGNOSIS — K209 Esophagitis, unspecified without bleeding: Secondary | ICD-10-CM | POA: Diagnosis not present

## 2020-10-30 DIAGNOSIS — K317 Polyp of stomach and duodenum: Secondary | ICD-10-CM | POA: Diagnosis not present

## 2020-10-30 DIAGNOSIS — Z20822 Contact with and (suspected) exposure to covid-19: Secondary | ICD-10-CM | POA: Diagnosis not present

## 2020-10-30 DIAGNOSIS — Z9114 Patient's other noncompliance with medication regimen: Secondary | ICD-10-CM

## 2020-10-30 DIAGNOSIS — K2100 Gastro-esophageal reflux disease with esophagitis, without bleeding: Secondary | ICD-10-CM | POA: Diagnosis present

## 2020-10-30 DIAGNOSIS — K31819 Angiodysplasia of stomach and duodenum without bleeding: Secondary | ICD-10-CM | POA: Diagnosis not present

## 2020-10-30 DIAGNOSIS — D62 Acute posthemorrhagic anemia: Secondary | ICD-10-CM | POA: Diagnosis not present

## 2020-10-30 DIAGNOSIS — K449 Diaphragmatic hernia without obstruction or gangrene: Secondary | ICD-10-CM | POA: Diagnosis present

## 2020-10-30 DIAGNOSIS — R000 Tachycardia, unspecified: Secondary | ICD-10-CM | POA: Diagnosis not present

## 2020-10-30 DIAGNOSIS — I517 Cardiomegaly: Secondary | ICD-10-CM | POA: Diagnosis not present

## 2020-10-30 DIAGNOSIS — R531 Weakness: Secondary | ICD-10-CM | POA: Diagnosis not present

## 2020-10-30 DIAGNOSIS — J449 Chronic obstructive pulmonary disease, unspecified: Secondary | ICD-10-CM | POA: Diagnosis not present

## 2020-10-30 LAB — CBC WITH DIFFERENTIAL/PLATELET
Abs Immature Granulocytes: 0.06 10*3/uL (ref 0.00–0.07)
Basophils Absolute: 0 10*3/uL (ref 0.0–0.1)
Basophils Relative: 0 %
Eosinophils Absolute: 0.7 10*3/uL — ABNORMAL HIGH (ref 0.0–0.5)
Eosinophils Relative: 9 %
HCT: 24 % — ABNORMAL LOW (ref 36.0–46.0)
Hemoglobin: 6.2 g/dL — CL (ref 12.0–15.0)
Immature Granulocytes: 1 %
Lymphocytes Relative: 9 %
Lymphs Abs: 0.7 10*3/uL (ref 0.7–4.0)
MCH: 21.2 pg — ABNORMAL LOW (ref 26.0–34.0)
MCHC: 25.8 g/dL — ABNORMAL LOW (ref 30.0–36.0)
MCV: 81.9 fL (ref 80.0–100.0)
Monocytes Absolute: 0.5 10*3/uL (ref 0.1–1.0)
Monocytes Relative: 7 %
Neutro Abs: 6 10*3/uL (ref 1.7–7.7)
Neutrophils Relative %: 74 %
Platelets: 335 10*3/uL (ref 150–400)
RBC: 2.93 MIL/uL — ABNORMAL LOW (ref 3.87–5.11)
RDW: 20.5 % — ABNORMAL HIGH (ref 11.5–15.5)
WBC: 8 10*3/uL (ref 4.0–10.5)
nRBC: 0 % (ref 0.0–0.2)

## 2020-10-30 LAB — BASIC METABOLIC PANEL
Anion gap: 7 (ref 5–15)
BUN: 13 mg/dL (ref 8–23)
CO2: 30 mmol/L (ref 22–32)
Calcium: 8.4 mg/dL — ABNORMAL LOW (ref 8.9–10.3)
Chloride: 102 mmol/L (ref 98–111)
Creatinine, Ser: 0.86 mg/dL (ref 0.44–1.00)
GFR, Estimated: >60 mL/min (ref >60)
Glucose, Bld: 106 mg/dL — ABNORMAL HIGH (ref 70–99)
Potassium: 4.2 mmol/L (ref 3.5–5.1)
Sodium: 139 mmol/L (ref 135–145)

## 2020-10-30 LAB — RESP PANEL BY RT-PCR (FLU A&B, COVID) ARPGX2
Influenza A by PCR: NEGATIVE
Influenza B by PCR: NEGATIVE
SARS Coronavirus 2 by RT PCR: NEGATIVE

## 2020-10-30 LAB — POC OCCULT BLOOD, ED: Fecal Occult Bld: POSITIVE — AB

## 2020-10-30 LAB — PREPARE RBC (CROSSMATCH)

## 2020-10-30 MED ORDER — ONDANSETRON HCL 4 MG/2ML IJ SOLN: 4.0000 mg | Freq: Four times a day (QID) | INTRAMUSCULAR | Status: DC | PRN

## 2020-10-30 MED ORDER — ALBUTEROL SULFATE HFA 108 (90 BASE) MCG/ACT IN AERS: 2.0000 | INHALATION_SPRAY | Freq: Four times a day (QID) | RESPIRATORY_TRACT | Status: DC | PRN

## 2020-10-30 MED ORDER — SODIUM CHLORIDE 0.9 % IV SOLN
2.0000 g | INTRAVENOUS | Status: DC
  Administered 2020-10-30 – 2020-11-01 (×2): 2 g via INTRAVENOUS
  Filled 2020-10-30 (×2): qty 20

## 2020-10-30 MED ORDER — TRAZODONE HCL 50 MG PO TABS
150.0000 mg | ORAL_TABLET | Freq: Every day | ORAL | Status: DC
  Administered 2020-10-30 – 2020-10-31 (×2): 150 mg via ORAL
  Filled 2020-10-30 (×2): qty 3

## 2020-10-30 MED ORDER — PANTOPRAZOLE SODIUM 40 MG IV SOLR
40.0000 mg | Freq: Two times a day (BID) | INTRAVENOUS | Status: DC
  Administered 2020-10-30 – 2020-10-31 (×2): 40 mg via INTRAVENOUS
  Filled 2020-10-30 (×2): qty 40

## 2020-10-30 MED ORDER — SODIUM CHLORIDE 0.9% IV SOLUTION: Freq: Once | INTRAVENOUS | Status: AC

## 2020-10-30 MED ORDER — ACETAMINOPHEN 650 MG RE SUPP: 650.0000 mg | Freq: Four times a day (QID) | RECTAL | Status: DC | PRN

## 2020-10-30 MED ORDER — POLYETHYLENE GLYCOL 3350 17 G PO PACK: 17.0000 g | PACK | Freq: Every day | ORAL | Status: DC | PRN

## 2020-10-30 MED ORDER — BISOPROLOL FUMARATE 5 MG PO TABS
5.0000 mg | ORAL_TABLET | Freq: Every day | ORAL | Status: DC
  Administered 2020-10-30 – 2020-11-01 (×3): 5 mg via ORAL
  Filled 2020-10-30 (×5): qty 1

## 2020-10-30 MED ORDER — ONDANSETRON HCL 4 MG PO TABS: 4.0000 mg | ORAL_TABLET | Freq: Four times a day (QID) | ORAL | Status: DC | PRN

## 2020-10-30 MED ORDER — ACETAMINOPHEN 325 MG PO TABS
650.0000 mg | ORAL_TABLET | Freq: Four times a day (QID) | ORAL | Status: DC | PRN
  Administered 2020-10-31 – 2020-11-01 (×2): 650 mg via ORAL
  Filled 2020-10-30 (×2): qty 2

## 2020-10-30 MED ORDER — ALPRAZOLAM 0.5 MG PO TABS
0.5000 mg | ORAL_TABLET | Freq: Two times a day (BID) | ORAL | Status: DC
  Administered 2020-10-30 – 2020-11-01 (×5): 0.5 mg via ORAL
  Filled 2020-10-30 (×5): qty 1

## 2020-10-30 MED ORDER — FUROSEMIDE 10 MG/ML IJ SOLN
40.0000 mg | Freq: Once | INTRAMUSCULAR | Status: AC
  Administered 2020-10-30: 40 mg via INTRAVENOUS
  Filled 2020-10-30: qty 4

## 2020-10-30 MED ORDER — SODIUM CHLORIDE 0.9 % IV SOLN
100.0000 mg | Freq: Two times a day (BID) | INTRAVENOUS | Status: DC
  Administered 2020-10-30 – 2020-11-01 (×3): 100 mg via INTRAVENOUS
  Filled 2020-10-30 (×8): qty 100

## 2020-10-30 NOTE — ED Notes (Signed)
Date and time results received: 10/30/20 1544 (use smartphrase ".now" to insert current time)  Test: Hgb Critical Value: 6.2  Name of Provider Notified: Alvino Chapel, MD  Orders Received? Or Actions Taken?:

## 2020-10-30 NOTE — Telephone Encounter (Signed)
Misty [name] that I spoke with patient this AM and she requested for an infusion to be set up so she wouldn't have to go through the ER. I spoke with the [facility] infusion center and due to low number they can't infuse her and she will have to go through the ER. Patient notified and Misty [name] advised that she would leave work and go take patient to ER.

## 2020-10-30 NOTE — H&P (Signed)
History and Physical    Misty [name][id number] DOB: 06/07/1941 DOA: 10/30/2020  PCP: Claretta Fraise, MD   Patient coming from: Home  I have personally briefly reviewed patient's old medical records in [place]  Chief Complaint: Low Hgb  HPI: Misty [name] is a 79 y.o. female with medical history significant for systolic CHF, paroxysmal atrial fibrillation, GI bleed, ischemic colitis, COPD.  Multiple admissions- 2020 for GI bleed and symptomatic anemia.   Patient was sent to the ED today by her PCP who reports of low hemoglobin of 6.6.  Patient reports fatigue over the past few days.  She denies abdominal pain, no vomiting of blood or otherwise, no apparent blood in stools. She has also had a new productive cough over the past week, no difficulty breathing.  Unfortunately outpatient transfusion could not be arranged hence patient was sent to the ED.  ED Course: Heart rate 90s- 120-s, respiratory rate 18-28, blood pressure systolic [id number].  O2 sats greater than 93% on room air.  Positive stool occult.  Hemoglobin 6.2.  EKG showing sinus tachycardia.  Portable chest x-ray left basilar opacity concerning for atelectasis or infiltrate.  2 units PRBC ordered for transfusion.  Hospitalist to admit for acute symptomatic anemia.  Review of Systems: As per HPI all other systems reviewed and negative.  Past Medical History:  Diagnosis Date  . Anxiety    takes Xanax daily  . Arthritis    back  . Atrial fibrillation with RVR ([place]) 03/25/2019  . Cataract   . Chronic back pain   . Colitis   . Constipation    OTC stool softener prn  . COPD (chronic obstructive pulmonary disease) ([place])   . Depression   . Diverticulosis   . GERD (gastroesophageal reflux disease)    takes Ranidine daily  . Hemorrhoids   . History of bronchitis   . History of migraine    many yrs ago  . History of shingles   . Hyperlipidemia   . Insomnia   . Migraines   . Nontraumatic compression  fracture of T11 vertebra ([facility]) 07/08/2014  . Osteoporosis   . PONV (postoperative nausea and vomiting)     Past Surgical History:  Procedure Laterality Date  . ABDOMINAL HYSTERECTOMY    . BIOPSY  07/12/2019   Procedure: BIOPSY;  Surgeon: Rogene Houston, MD;  [facility];  Service: Endoscopy;;  duodenum  . COLONOSCOPY    . COLONOSCOPY N/A 02/17/2015   Procedure: COLONOSCOPY;  Surgeon: Rogene Houston, MD;  [facility];  Service: Endoscopy;  Laterality: N/A;  110n - moved to 11:15 - Ann to notify pt  . COLONOSCOPY N/A 07/12/2019   Procedure: COLONOSCOPY;  Surgeon: Rogene Houston, MD;  [facility];  Service: Endoscopy;  Laterality: N/A;  . ESOPHAGOGASTRODUODENOSCOPY    . ESOPHAGOGASTRODUODENOSCOPY N/A 01/29/2016   Procedure: ESOPHAGOGASTRODUODENOSCOPY (EGD);  Surgeon: Rogene Houston, MD;  [facility];  Service: Endoscopy;  Laterality: N/A;  . ESOPHAGOGASTRODUODENOSCOPY N/A 10/14/2017   Procedure: ESOPHAGOGASTRODUODENOSCOPY (EGD);  Surgeon: Rogene Houston, MD;  [facility];  Service: Endoscopy;  Laterality: N/A;  730  . ESOPHAGOGASTRODUODENOSCOPY N/A 07/12/2019   Procedure: ESOPHAGOGASTRODUODENOSCOPY (EGD);  Surgeon: Rogene Houston, MD;  [facility];  Service: Endoscopy;  Laterality: N/A;  . ESOPHAGOGASTRODUODENOSCOPY N/A 10/03/2019   Procedure: ESOPHAGOGASTRODUODENOSCOPY (EGD);  Surgeon: Rogene Houston, MD;  [facility];  Service: Endoscopy;  Laterality: N/A;  .  EYE SURGERY Right 3/15   cataracts  . GIVENS CAPSULE STUDY N/A 07/29/2019   Procedure: GIVENS CAPSULE STUDY;  Surgeon: Rogene Houston, MD;  [facility];  Service: Endoscopy;  Laterality: N/A;  . GIVENS CAPSULE STUDY N/A 08/20/2019   Procedure: GIVENS CAPSULE STUDY;  Surgeon: Rogene Houston, MD;  [facility];  Service: Endoscopy;  Laterality: N/A;  730am  . GIVENS CAPSULE STUDY  10/03/2019   Procedure: GIVENS CAPSULE  STUDY;  Surgeon: Rogene Houston, MD;  [facility];  Service: Endoscopy;;  . INTRAMEDULLARY (IM) NAIL INTERTROCHANTERIC Left 01/07/2019   Procedure: INTRAMEDULLARY (IM) NAIL INTERTROCHANTRIC;  Surgeon: Carole Civil, MD;  Location: [facility];  Service: Orthopedics;  Laterality: Left;  . KYPHOPLASTY N/A 07/08/2014   Procedure: Thoracic Eleven Kyphoplasty;  Surgeon: Hosie Spangle, MD;  Location: [facility];  Service: Neurosurgery;  Laterality: N/A;  . left hip surgery    . LUMBAR LAMINECTOMY/DECOMPRESSION MICRODISCECTOMY Bilateral 05/20/2013   Procedure: LUMBAR LAMINECTOMY/DECOMPRESSION MICRODISCECTOMY 1 LEVEL;  Surgeon: Hosie Spangle, MD;  Location: [facility];  Service: Neurosurgery;  Laterality: Bilateral;  Bilateral Lumbar four-five laminotomy and left lumbar four-five microdiskectomy  . RIGHT/LEFT HEART CATH AND CORONARY ANGIOGRAPHY N/A 03/31/2019   Procedure: RIGHT/LEFT HEART CATH AND CORONARY ANGIOGRAPHY;  Surgeon: Sherren Mocha, MD;  Location: [facility];  Service: Cardiovascular;  Laterality: N/A;  . SPINE SURGERY     Dr Carloyn Manner -  vertebraplasty     reports that she has been smoking cigarettes. She started smoking about 61 years ago. She has a 52.00 pack-year smoking history. She has never used smokeless tobacco. She reports that she does not drink alcohol and does not use drugs.  Allergies  Allergen Reactions  . Codeine Nausea And Vomiting  . Tramadol Nausea And Vomiting  . Asa [Aspirin] Other (See Comments)    Patient is unaware of allergy  . Azithromycin Other (See Comments)    Patient is unaware of allergy  . Celebrex [Celecoxib] Other (See Comments)    Irritated stomach   . Cymbalta [Duloxetine Hcl] Other (See Comments)    Felt groggy  . Prednisone Rash    She has seen the podiatrist and he had injected cortisone in her feet and she had also taken a peel at home. She had a severe reaction to her face with a rash and had to take Benadryl to  resolve the symptom. She actually did get more cortisone shots, but no additional reactions to the shots.  . Vioxx [Rofecoxib] Other (See Comments)    Unknown  . Zelnorm [Tegaserod] Other (See Comments)    Patient is unaware of allergy  . Zocor [Simvastatin] Other (See Comments)    Patient is unaware of allergy    Family History  Problem Relation Age of Onset  . Hypertension Mother   . Heart attack Mother   . Macular degeneration Father   . Heart disease Father   . Cirrhosis Son   . Heart disease Son   . Colon cancer Neg Hx     Prior to Admission medications   Medication Sig Start Date End Date Taking? Authorizing Provider  acetaminophen (TYLENOL) 500 MG tablet Take 500 mg by mouth every 8 (eight) hours as needed for mild pain, moderate pain or headache.  01/20/19  Yes [provider]  albuterol (VENTOLIN HFA) 108 (90 Base) MCG/ACT inhaler Inhale 2 puffs into the lungs every 6 (six) hours as needed for wheezing or  shortness of breath (cough). 04/20/20  Yes Stacks, Cletus Gash, MD  ALPRAZolam Duanne Moron) 0.5 MG tablet Take 1 tablet (0.5 mg total) by mouth 2 (two) times daily. 10/26/20  Yes Stacks, Cletus Gash, MD  bisoprolol (ZEBETA) 5 MG tablet Take 1 tablet (5 mg total) by mouth daily. Patient taking differently: Take 5 mg by mouth daily. 05/19/20  Yes Minus Breeding, MD  calcium carbonate (TUMS - DOSED IN MG ELEMENTAL CALCIUM) 500 MG chewable tablet Chew 1 tablet by mouth daily.   Yes [provider]  cholecalciferol (VITAMIN D) 1000 UNITS tablet Take 2,000 Units by mouth daily.    Yes [provider]  furosemide (LASIX) 20 MG tablet Take 1 tablet (20 mg total) by mouth daily. 04/20/20  Yes StacksCletus Gash, MD  Multiple Vitamin (MULTIVITAMIN WITH MINERALS) TABS tablet Take 1 tablet by mouth daily. Centrum Silver   Yes [provider]  pantoprazole (PROTONIX) 40 MG tablet Take 1 tablet (40 mg total) by mouth 2 (two) times daily. 10/26/20  Yes Stacks, Cletus Gash, MD  potassium  chloride SA (KLOR-CON) 20 MEQ tablet Take 1 tablet (20 mEq total) by mouth daily 07/20/20  Yes Stacks, Cletus Gash, MD  traZODone (DESYREL) 150 MG tablet Use from 1/3 to 1 tablet nightly as needed for sleep. 07/20/20  Yes Stacks, Cletus Gash, MD  vitamin B-12 (CYANOCOBALAMIN) 1000 MCG tablet Take 1,000 mcg by mouth daily.   Yes [provider]  escitalopram (LEXAPRO) 20 MG tablet Take 1 tablet (20 mg total) by mouth at bedtime. Patient not taking: No sig reported 04/20/20   Claretta Fraise, MD  lidocaine (XYLOCAINE) 2 % solution Use as directed 15 mLs in the mouth or throat every 6 (six) hours as needed for mouth pain. Nothing to eat or drink for 30 minutes after use Patient not taking: No sig reported 04/20/20   Claretta Fraise, MD  metoCLOPramide (REGLAN) 5 MG tablet Take 1 tablet (5 mg total) by mouth 3 (three) times daily before meals. Patient not taking: No sig reported 08/22/19   Rogene Houston, MD  nystatin (MYCOSTATIN) 100000 UNIT/ML suspension Take 5 mLs (500,000 Units total) by mouth 4 (four) times daily. Patient not taking: No sig reported 02/10/20   Baruch Gouty, FNP    Physical Exam: Vitals:   10/30/20 1500 10/30/20 1530 10/30/20 1615 10/30/20 1630  BP: 126/71 98/85 130/77 138/74  Pulse: 99 (!) 133 98 98  Resp: 18 (!) 22 (!) 24 (!) 28  Temp:      TempSrc:      SpO2: 94% 95% 91% 91%  Weight:      Height:        Constitutional: Appears pale,  calm, comfortable Vitals:   10/30/20 1500 10/30/20 1530 10/30/20 1615 10/30/20 1630  BP: 126/71 98/85 130/77 138/74  Pulse: 99 (!) 133 98 98  Resp: 18 (!) 22 (!) 24 (!) 28  Temp:      TempSrc:      SpO2: 94% 95% 91% 91%  Weight:      Height:       Eyes: PERRL, lids and conjunctivae normal ENMT: Mucous membranes are moist. Posterior pharynx clear of any exudate or lesions.Normal dentition.  Neck: normal, supple, no masses, no thyromegaly Respiratory: clear to auscultation bilaterally, no wheezing, no crackles. Normal respiratory effort.  No accessory muscle use.  Cardiovascular: Regular rate and rhythm, . No extremity edema. 2+ pedal pulses.  Abdomen: no tenderness, no masses palpated. No hepatosplenomegaly. Bowel sounds positive.  Musculoskeletal: no clubbing / cyanosis.  No joint deformity upper and lower extremities. Good ROM, no contractures. Normal muscle tone.  Skin: no rashes, lesions, ulcers. No induration Neurologic: No apparent cranial normality, moving extremities spontaneously. Psychiatric: Normal judgment and insight. Alert and oriented x 3. Normal mood.   Labs on Admission: I have personally reviewed following labs and imaging studies  CBC: Recent Labs  Lab 10/26/20 1056 10/30/20 1523  WBC 8.6 8.0  NEUTROABS 6.9 6.0  HGB 6.6* 6.2*  HCT 23.6* 24.0*  MCV 76* 81.9  PLT 345 220   Basic Metabolic Panel: Recent Labs  Lab 10/26/20 1056 10/30/20 1523  NA 142 139  K 4.4 4.2  CL 100 102  CO2 23 30  GLUCOSE 110* 106*  BUN 14 13  CREATININE 0.92 0.86  CALCIUM 9.0 8.4*   Liver Function Tests: Recent Labs  Lab 10/26/20 1056  AST 8  ALT 6  ALKPHOS 128*  BILITOT <0.2  PROT 6.6  ALBUMIN 3.6*    Radiological Exams on Admission: DG Chest Portable 1 View  Result Date: 10/30/2020 CLINICAL DATA:  Weakness, anemia. EXAM: PORTABLE CHEST 1 VIEW COMPARISON:  July 10, 2019. FINDINGS: Stable cardiomegaly. No pneumothorax is noted. No pleural effusion is noted. Increased left basilar opacity is noted concerning for atelectasis or infiltrate. Stable probable scarring seen in right lung. Status post kyphoplasty at multiple levels of the lower thoracic and lumbar spine. IMPRESSION: Increased left basilar opacity is noted concerning for atelectasis or infiltrate. Aortic Atherosclerosis (ICD10-I70.0). Electronically Signed   By: Marijo Conception M.D.   On: 10/30/2020 15:23    EKG: Independently reviewed.  Sinus/ectopic atrial tachycardia rate 127.  QTc 480.  No significant ST-T wave changes compared to  prior.  Assessment/Plan Principal Problem:   Symptomatic anemia Active Problems:   Pulmonary fibrosis (HCC)   Depression, recurrent (HCC)   Paroxysmal atrial fibrillation ([facility])   Chronic systolic HF (heart failure) ([facility]CC)   Symptomatic anemia with history of GI bleed-hemoglobin today 6.2.  Fatigue, tachycardic.  Requiring multiple admissions and transfusions 2020.  Last EGD  Nov 2020 showed hypertensive gastropathy, GAVE.  -IV Lasix 40 mg with infusions -Transfuse 2 units PRBC -GI consulted- Iv PPI twice daily, EGD +/- capsule likely tomorrow, clear liquids now n.p.o. midnight.  Community-acquired pneumonia-productive cough 1 week, no dyspnea, CBC 8.  Afebrile.  Greater than 92% on room air.  Portable chest x-ray increased left base opacity-atelectasis or infiltrate. -IV ceftriaxone and doxycycline.  Chronic systolic heart failure-stable and compensated, last echo 03/2019 EF 30 to 35%. -IV Lasix 40 mg x 1 with infusions. - Hold lasix 73m daily for now with GI bleed  Paroxysmal atrial fibrillation, currently in sinus/ectopic atrial tachycardia- rates 90 - 120.  Previously on anticoagulation long since discontinued.  -Resume bisoprolol  Pulmonary fibrosis-stable on room air  Anxiety/depression- -  Resume as needed Xanax.  DVT prophylaxis: SCDs Code Status: Full code) Family Communication: None at bedside Disposition Plan:  ~ 2 days, pending GI evaluation, stable hemoglobin. Consults called: GI Admission status: Inpatient, tele  I certify that at the point of admission it is my clinical judgment that the patient will require inpatient hospital care spanning beyond 2 midnights from the point of admission due to high intensity of service, high risk for further deterioration and high frequency of surveillance required. The following factors support the patient status of inpatient: Presence of both pneumonia and acute symptomatic anemia likely from GI bleed.   EBethena Roys MD [facility]  10/30/2020, 8:57 PM

## 2020-10-30 NOTE — ED Notes (Signed)
Provider at bedside

## 2020-10-30 NOTE — Consult Note (Signed)
Referring Provider: Nuala Alpha, PA-C, [facility]  Primary Care Physician:  Claretta Fraise, MD Primary Gastroenterologist:  Dr. Laural Golden   Date of Admission: 10/30/20 Date of Consultation: 10/30/20  Reason for Consultation:  Acute on chronic anemia, heme positive stool   HPI:  Misty [name] is a 79 y.o. year old female with history significant for celiac disease, chronic GERD, afib, transfusion dependent anemia/IDA with multiple prior admissions last year for transfusion dependent anemia and occult GI bleeding. Prior GI evaluation as listed below, with chronic IDA felt to be related to ongoing small bowel injury in setting of celiac disease and non-compliance, possible component of GAVE contributing, although this was not actively bleeding historically on prior EGDs. No known history of liver disease. Last EGD in Nov 2020 with portal hypertensive gastropathy, GAVE. Capsule study in Nov 2020 as noted below. Colonoscopy Aug 2020 overall unimpressive.    Hgb found to be 6.6 last week through PCP. Patient contacted and attempt at outpatient transfusion was to be arranged; however, she was advised to present to the ED due to profound anemia and unable to complete transfusion as outpatient. Previously 9.7 in Sept 2021. Hgb 6.2 on admission today. Heme positive. Hgb 7-9 range earlier this year.   Initially, patient states she tries to follow a gluten-free diet. Later in conversation, states she rarely follows gluten-free diet as she finds it difficult. Has iron at home but not taking it every day. Tylenol products. No NSAIDs or aspirin powders. No dysphagia. Chronic GERD. PPI BID. States vomits at times. CHRONIC issue. No formal GES but question of gastroparesis historically. Capsule never left stomach in Sept 2020. Feels fatigued. No melena or hematochezia. Cough for one week. Sinus pressure. Covid negative.     EVALUATION OVER THE PAST YEAR:  Capsule Nov 2020: scalloping of bulbar mucosa,  edema to mucosal folds involving most of jejunum. Two small ulcers with clean base in distal small bowel/ilum. Felt ongoing small bowel injury in setting of celiac disease and non-compliance with gluten-free diet. Possible GAVE contributing, but this had appeared to be mild in the past and no bleeding historically.   EGD Nov 2020: normal hypopharynx, esophageal plaques suspicious for candida, portal hypertensive gastropathy and GAVE without bleeding. Scalloped mucosa of duodenum consistent with diagnosis of celiac disease.    EGD 07/12/2019  small sliding hiatal hernia, portal gastropathy, minimal scalloping to the duodenal mucosa but biopsy revealed resolution of changes.  Colonoscopy 06/22/2019 left-sided diverticulosis, internal/external hemorrhoids.  Given capsule study on 07/29/2019 Capsule never left the stomach.  CTA abdomen and pelvis on 07/30/2019. No evidence of active bleeding.  Atherosclerotic changes to celiac trunk with less than 50% stenosis and dense calcification to origin of SMA concerning for high-grade stenosis.   GI bleeding scan on 08/12/2019 Negative.     Past Medical History:  Diagnosis Date  . Anxiety    takes Xanax daily  . Arthritis    back  . Atrial fibrillation with RVR ([place]) 03/25/2019  . Cataract   . Chronic back pain   . Colitis   . Constipation    OTC stool softener prn  . COPD (chronic obstructive pulmonary disease) ([place])   . Depression   . Diverticulosis   . GERD (gastroesophageal reflux disease)    takes Ranidine daily  . Hemorrhoids   . History of bronchitis   . History of migraine    many yrs ago  . History of shingles   . Hyperlipidemia   . Insomnia   .  Migraines   . Nontraumatic compression fracture of T11 vertebra ([facility]) 07/08/2014  . Osteoporosis   . PONV (postoperative nausea and vomiting)     Past Surgical History:  Procedure Laterality Date  . ABDOMINAL HYSTERECTOMY    . BIOPSY  07/12/2019   Procedure: BIOPSY;  Surgeon:  Rogene Houston, MD;  [facility];  Service: Endoscopy;;  duodenum  . COLONOSCOPY    . COLONOSCOPY N/A 02/17/2015   Procedure: COLONOSCOPY;  Surgeon: Rogene Houston, MD;  [facility];  Service: Endoscopy;  Laterality: N/A;  110n - moved to 11:15 - Ann to notify pt  . COLONOSCOPY N/A 07/12/2019   Procedure: COLONOSCOPY;  Surgeon: Rogene Houston, MD;  [facility];  Service: Endoscopy;  Laterality: N/A;  . ESOPHAGOGASTRODUODENOSCOPY    . ESOPHAGOGASTRODUODENOSCOPY N/A 01/29/2016   Procedure: ESOPHAGOGASTRODUODENOSCOPY (EGD);  Surgeon: Rogene Houston, MD;  [facility];  Service: Endoscopy;  Laterality: N/A;  . ESOPHAGOGASTRODUODENOSCOPY N/A 10/14/2017   Procedure: ESOPHAGOGASTRODUODENOSCOPY (EGD);  Surgeon: Rogene Houston, MD;  [facility];  Service: Endoscopy;  Laterality: N/A;  730  . ESOPHAGOGASTRODUODENOSCOPY N/A 07/12/2019   Procedure: ESOPHAGOGASTRODUODENOSCOPY (EGD);  Surgeon: Rogene Houston, MD;  [facility];  Service: Endoscopy;  Laterality: N/A;  . ESOPHAGOGASTRODUODENOSCOPY N/A 10/03/2019   Procedure: ESOPHAGOGASTRODUODENOSCOPY (EGD);  Surgeon: Rogene Houston, MD;  [facility];  Service: Endoscopy;  Laterality: N/A;  . EYE SURGERY Right 3/15   cataracts  . GIVENS CAPSULE STUDY N/A 07/29/2019   Procedure: GIVENS CAPSULE STUDY;  Surgeon: Rogene Houston, MD;  [facility];  Service: Endoscopy;  Laterality: N/A;  . GIVENS CAPSULE STUDY N/A 08/20/2019   Procedure: GIVENS CAPSULE STUDY;  Surgeon: Rogene Houston, MD;  [facility];  Service: Endoscopy;  Laterality: N/A;  730am  . GIVENS CAPSULE STUDY  10/03/2019   Procedure: GIVENS CAPSULE STUDY;  Surgeon: Rogene Houston, MD;  [facility];  Service: Endoscopy;;  . INTRAMEDULLARY (IM) NAIL INTERTROCHANTERIC Left 01/07/2019   Procedure: INTRAMEDULLARY (IM) NAIL INTERTROCHANTRIC;  Surgeon: Carole Civil, MD;   Location: [facility];  Service: Orthopedics;  Laterality: Left;  . KYPHOPLASTY N/A 07/08/2014   Procedure: Thoracic Eleven Kyphoplasty;  Surgeon: Hosie Spangle, MD;  Location: [facility];  Service: Neurosurgery;  Laterality: N/A;  . left hip surgery    . LUMBAR LAMINECTOMY/DECOMPRESSION MICRODISCECTOMY Bilateral 05/20/2013   Procedure: LUMBAR LAMINECTOMY/DECOMPRESSION MICRODISCECTOMY 1 LEVEL;  Surgeon: Hosie Spangle, MD;  Location: [facility];  Service: Neurosurgery;  Laterality: Bilateral;  Bilateral Lumbar four-five laminotomy and left lumbar four-five microdiskectomy  . RIGHT/LEFT HEART CATH AND CORONARY ANGIOGRAPHY N/A 03/31/2019   Procedure: RIGHT/LEFT HEART CATH AND CORONARY ANGIOGRAPHY;  Surgeon: Sherren Mocha, MD;  Location: [facility];  Service: Cardiovascular;  Laterality: N/A;  . SPINE SURGERY     Dr Carloyn Manner -  vertebraplasty    Prior to Admission medications   Medication Sig Start Date End Date Taking? Authorizing Provider  acetaminophen (TYLENOL) 500 MG tablet Take 500 mg by mouth every 8 (eight) hours as needed for mild pain, moderate pain or headache.  01/20/19   [provider]  albuterol (VENTOLIN HFA) 108 (90 Base) MCG/ACT inhaler Inhale 2 puffs into the lungs every 6 (six) hours as needed for wheezing or shortness of breath (cough). 04/20/20   Claretta Fraise, MD  ALPRAZolam Duanne Moron) 0.5 MG tablet Take 1 tablet (0.5 mg total)  by mouth 2 (two) times daily. 10/26/20   Claretta Fraise, MD  bisoprolol (ZEBETA) 5 MG tablet Take 1 tablet (5 mg total) by mouth daily. 05/19/20   Minus Breeding, MD  cholecalciferol (VITAMIN D) 1000 UNITS tablet Take 2,000 Units by mouth daily.     [provider]  escitalopram (LEXAPRO) 20 MG tablet Take 1 tablet (20 mg total) by mouth at bedtime. 04/20/20   Claretta Fraise, MD  furosemide (LASIX) 20 MG tablet Take 1 tablet (20 mg total) by mouth daily. 04/20/20   Claretta Fraise, MD  lidocaine (XYLOCAINE) 2 % solution Use as directed 15  mLs in the mouth or throat every 6 (six) hours as needed for mouth pain. Nothing to eat or drink for 30 minutes after use Patient not taking: No sig reported 04/20/20   Claretta Fraise, MD  metoCLOPramide (REGLAN) 5 MG tablet Take 1 tablet (5 mg total) by mouth 3 (three) times daily before meals. Patient not taking: Reported on 10/26/2020 08/22/19   Rogene Houston, MD  Multiple Vitamin (MULTIVITAMIN WITH MINERALS) TABS tablet Take 1 tablet by mouth daily. Centrum Silver    [provider]  nystatin (MYCOSTATIN) 100000 UNIT/ML suspension Take 5 mLs (500,000 Units total) by mouth 4 (four) times daily. Patient not taking: No sig reported 02/10/20   Baruch Gouty, FNP  pantoprazole (PROTONIX) 40 MG tablet Take 1 tablet (40 mg total) by mouth 2 (two) times daily. 10/26/20   Claretta Fraise, MD  potassium chloride SA (KLOR-CON) 20 MEQ tablet Take 1 tablet (20 mEq total) by mouth daily 07/20/20   Claretta Fraise, MD  traZODone (DESYREL) 150 MG tablet Use from 1/3 to 1 tablet nightly as needed for sleep. 07/20/20   Claretta Fraise, MD  vitamin B-12 (CYANOCOBALAMIN) 1000 MCG tablet Take 1,000 mcg by mouth daily.    [provider]    Current Facility-Administered Medications  Medication Dose Route Frequency Provider Last Rate Last Admin  . 0.9 %  sodium chloride infusion (Manually program via Guardrails IV Fluids)   Intravenous Once Deliah Boston, PA-C       Current Outpatient Medications  Medication Sig Dispense Refill  . acetaminophen (TYLENOL) 500 MG tablet Take 500 mg by mouth every 8 (eight) hours as needed for mild pain, moderate pain or headache.     . albuterol (VENTOLIN HFA) 108 (90 Base) MCG/ACT inhaler Inhale 2 puffs into the lungs every 6 (six) hours as needed for wheezing or shortness of breath (cough). 18 g 11  . ALPRAZolam (XANAX) 0.5 MG tablet Take 1 tablet (0.5 mg total) by mouth 2 (two) times daily. 60 tablet 2  . bisoprolol (ZEBETA) 5 MG tablet Take 1 tablet (5 mg total)  by mouth daily. 45 tablet 1  . cholecalciferol (VITAMIN D) 1000 UNITS tablet Take 2,000 Units by mouth daily.     Marland Kitchen escitalopram (LEXAPRO) 20 MG tablet Take 1 tablet (20 mg total) by mouth at bedtime. 30 tablet 5  . furosemide (LASIX) 20 MG tablet Take 1 tablet (20 mg total) by mouth daily. 90 tablet 3  . lidocaine (XYLOCAINE) 2 % solution Use as directed 15 mLs in the mouth or throat every 6 (six) hours as needed for mouth pain. Nothing to eat or drink for 30 minutes after use (Patient not taking: No sig reported) 100 mL 0  . metoCLOPramide (REGLAN) 5 MG tablet Take 1 tablet (5 mg total) by mouth 3 (three) times daily before meals. (Patient not taking: Reported on  10/26/2020) 90 tablet 2  . Multiple Vitamin (MULTIVITAMIN WITH MINERALS) TABS tablet Take 1 tablet by mouth daily. Centrum Silver    . nystatin (MYCOSTATIN) 100000 UNIT/ML suspension Take 5 mLs (500,000 Units total) by mouth 4 (four) times daily. (Patient not taking: No sig reported) 60 mL 0  . pantoprazole (PROTONIX) 40 MG tablet Take 1 tablet (40 mg total) by mouth 2 (two) times daily. 180 tablet 3  . potassium chloride SA (KLOR-CON) 20 MEQ tablet Take 1 tablet (20 mEq total) by mouth daily 30 tablet 5  . traZODone (DESYREL) 150 MG tablet Use from 1/3 to 1 tablet nightly as needed for sleep. 30 tablet 5  . vitamin B-12 (CYANOCOBALAMIN) 1000 MCG tablet Take 1,000 mcg by mouth daily.      Allergies as of 10/30/2020 - Review Complete 10/30/2020  Allergen Reaction Noted  . Codeine Nausea And Vomiting 04/08/2013  . Tramadol Nausea And Vomiting 10/07/2017  . Asa [aspirin] Other (See Comments) 04/08/2013  . Azithromycin Other (See Comments) 04/08/2013  . Celebrex [celecoxib] Other (See Comments) 04/08/2013  . Cymbalta [duloxetine hcl] Other (See Comments) 04/08/2013  . Prednisone Rash 03/25/2013  . Vioxx [rofecoxib] Other (See Comments) 04/08/2013  . Zelnorm [tegaserod] Other (See Comments) 04/08/2013  . Zocor [simvastatin] Other (See  Comments) 04/08/2013    Family History  Problem Relation Age of Onset  . Hypertension Mother   . Heart attack Mother   . Macular degeneration Father   . Heart disease Father   . Cirrhosis Son   . Heart disease Son   . Colon cancer Neg Hx     Social History   Socioeconomic History  . Marital status: Married    Spouse name: Sterling Big   . Number of children: Not on file  . Years of education: Not on file  . Highest education level: Not on file  Occupational History  . 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 Smoking status: Current Some Day Smoker    Packs/day: 1.00    Years: 52.00    Pack years: 52.00    Types: Cigarettes    Start date: 11/18/1958    Last attempt to quit: 08/20/2011    Years since quitting: 9.2  . Smokeless tobacco: Never Used  . Tobacco comment: 1/2-1pack off and on all her life  Vaping Use  . Vaping Use: Never used  Substance and Sexual Activity  . Alcohol use: No    Alcohol/week: 0.0 standard drinks  . Drug use: No  . Sexual activity: Not Currently  Other Topics Concern  . Not on file  Social History Narrative   Lives at home with husband, Sterling Big. Had one son- now deceased    Social Determinants of Radio broadcast assistant Strain: Not on file  Food Insecurity: Not on file  Transportation Needs: Not on file  Physical Activity: Not on file  Stress: Not on file  Social Connections: Not on file  Intimate Partner Violence: Not on file    Review of Systems: Gen: see HPI CV: Denies chest pain, heart palpitations, syncope, edema  Resp: see HPI GI: see HPI  GU : Denies urinary burning, urinary frequency, urinary incontinence.  MS: Denies joint pain,swelling, cramping Derm: Denies rash, itching, dry skin Psych: Denies depression, anxiety,confusion, or memory loss Heme: Denies bruising, bleeding, and enlarged lymph nodes.  Physical Exam: Vital signs in last 24 hours: Temp:  [98.1 F (36.7 C)] 98.1  F  (36.7 C) (12/13 1341) Pulse Rate:  [99-138] 133 (12/13 1530) Resp:  [18-22] 22 (12/13 1530) BP: (98-126)/(66-85) 98/85 (12/13 1530) SpO2:  [93 %-95 %] 95 % (12/13 1530) Weight:  [54.9 kg] 54.9 kg (12/13 1342)   General:   Alert,  Well-developed, well-nourished, pale-appearing Head:  Normocephalic and atraumatic. Eyes:  Sclera clear, no icterus.    Ears:  Normal auditory acuity. Nose:  No deformity, discharge,  or lesions. Mouth:  No deformity or lesions, dentition normal. Lungs:  Scattered rhonchi, unlabored breathing Heart:  S1 S2 present, query systolic murmur Abdomen:  Soft, nontender and nondistended. No masses, hepatosplenomegaly or hernias noted. Normal bowel sounds, without guarding, and without rebound.   Rectal:  Deferred  Msk:  Symmetrical without gross deformities. Normal posture. Extremities:  Without  edema. Neurologic:  Alert and  oriented x4 Psych:  Alert and cooperative. Normal mood and affect.  Intake/Output from previous day: No intake/output data recorded. Intake/Output this shift: No intake/output data recorded.  Lab Results: Recent Labs    10/30/20 1523  WBC 8.0  HGB 6.2*  HCT 24.0*  PLT 335   BMET Recent Labs    10/30/20 1523  NA 139  K 4.2  CL 102  CO2 30  GLUCOSE 106*  BUN 13  CREATININE 0.86  CALCIUM 8.4*    Studies/Results: DG Chest Portable 1 View  Result Date: 10/30/2020 CLINICAL DATA:  Weakness, anemia. EXAM: PORTABLE CHEST 1 VIEW COMPARISON:  July 10, 2019. FINDINGS: Stable cardiomegaly. No pneumothorax is noted. No pleural effusion is noted. Increased left basilar opacity is noted concerning for atelectasis or infiltrate. Stable probable scarring seen in right lung. Status post kyphoplasty at multiple levels of the lower thoracic and lumbar spine. IMPRESSION: Increased left basilar opacity is noted concerning for atelectasis or infiltrate. Aortic Atherosclerosis (ICD10-I70.0). Electronically Signed   By: Marijo Conception M.D.    On: 10/30/2020 15:23    Impression: 79 year old female with history significant for celiac disease, chronic GERD, chronic N/V (query delayed gastric emptying), transfusion dependent anemia/IDA with extensive evaluation last year, now presenting with symptomatic acute on chronic anemia and heme positive stool.  No overt GI bleeding. I do note that her Hgb was 6.6 through PCP last week, now 6.2. Previously 9.7 in Sept 2021. From review of records, she has been in the 7-9 range earlier this year.   Compliance remains an issue, as she is does not follow strict gluten-free diet and readily admits to eating gluten items routinely. Although advised to take iron by PCP, she has been doing this sporadically as well. Known GAVE on EGD last year, but this did not appear to have stigmata of bleeding. Likely  occult blood loss is multifactorial and predominantly related to small bowel source (two small ulcers in distal small bowel/ileum on prior capsule), possible GAVE as contributor, also malabsorption in setting of celiac disease, non-compliance with gluten-free diet.   She would benefit from EGD this admission and will allow clear liquids now with reassessment tomorrow. If capsule is indicated again, would need to be placed endoscopically as it did not pass from stomach historically. Recommend serial H/H as outpatient as well with close outpatient monitoring. Would benefit from official Hematology evaluation for iron infusions as doubtful she will be able to stay "caught up" with iron stores with only oral supplementation.   Plan: PPI IV BID Clear liquids now, NPO after midnight EGD +/- capsule tomorrow likely Agree with transfusion Recommend Hematology to follow as outpatient  with close monitoring of CBC, iron studies Will reassess in am   Annitta Needs, PhD, ANP-BC [facility]     LOS: 0 days    10/30/2020, 4:06 PM

## 2020-10-30 NOTE — ED Provider Notes (Signed)
[facility] Provider Note   CSN: [id number] Arrival date & time: 10/30/20  1246     History Chief Complaint  Patient presents with  . Abnormal Lab    Misty [name] is a 79 y.o. female history of COPD, A. fib/RVR, migraines, GERD, hemorrhoids, hyperlipidemia, heart failure, mitral regurg.  Patient arrives today for concern of anemia.  She reports that she had a blood drawn last week at her PCPs office and was informed today that her hemoglobin was 6.  She reports over the past few days she has been experiencing fatigue which is normal for her when her hemoglobin drops.  She also reports feeling cold at home and needing to wear more clothes than normal.  She is requesting blood transfusion.  She denies fever/chills, fall/injury, chest pain/shortness of breath, nausea/vomiting, hemoptysis, hematemesis, hematochezia, melena, abdominal pain or any additional concerns.  HPI     Past Medical History:  Diagnosis Date  . Anxiety    takes Xanax daily  . Arthritis    back  . Atrial fibrillation with RVR ([place]) 03/25/2019  . Cataract   . Chronic back pain   . Colitis   . Constipation    OTC stool softener prn  . COPD (chronic obstructive pulmonary disease) ([place])   . Depression   . Diverticulosis   . GERD (gastroesophageal reflux disease)    takes Ranidine daily  . Hemorrhoids   . History of bronchitis   . History of migraine    many yrs ago  . History of shingles   . Hyperlipidemia   . Insomnia   . Migraines   . Nontraumatic compression fracture of T11 vertebra (H[facility]) 07/08/2014  . Osteoporosis   . PONV (postoperative nausea and vomiting)     Patient Active Problem List   Diagnosis Date Noted  . Iron deficiency anemia due to chronic blood loss 08/26/2019  . Cardiomyopathy ([place]) 08/17/2019  . Nonrheumatic mitral valve regurgitation 08/17/2019  . Gastrointestinal hemorrhage 08/16/2019  . Chronic systolic HF (heart failure) ([place]) 08/11/2019  . Acute  anemia 08/10/2019  . GI bleed 07/28/2019  . Gastroesophageal reflux disease with esophagitis 07/20/2019  . Paroxysmal atrial fibrillation ([place]) 07/20/2019  . Anemia 07/10/2019  . Aortic atherosclerosis ([place]) 07/09/2019  . Vitamin D deficiency 07/09/2019  . GAD (generalized anxiety disorder) 07/09/2019  . Depression, recurrent ([place]) 07/09/2019  . Controlled substance agreement signed 07/09/2019  . Educated about COVID-19 virus infection 04/07/2019  . S/P thoracentesis   . Acute systolic heart failure ([place]) 03/31/2019  . Acute hypoxemic respiratory failure ([place])   . Elevated troponin 03/25/2019  . S/P IM nail left hip 01/07/2019 01/26/2019  . H/O kyphoplasty 01/14/2019  . Iron deficiency anemia 01/14/2019  . Acute blood loss anemia 01/13/2019  . Constipation due to opioid therapy 01/13/2019  . Anxiety and depression 01/13/2019  . Hypokalemia 01/10/2019  . Displaced intertrochanteric fracture of left femur, sequela 01/06/2019  . Chronic midline low back pain without sciatica 10/09/2018  . Chronic midline thoracic back pain 10/09/2018  . Osteopenia determined by x-ray 04/16/2018  . Pulmonary fibrosis ([place]) 04/07/2018  . Non-intractable vomiting with nausea 10/06/2017  . Malnutrition of moderate degree 01/29/2016  . Symptomatic anemia 01/27/2016  . Generalized weakness 01/27/2016  . Abnormal chest x-ray 01/27/2016  . Tobacco use disorder 01/27/2016  . Hematochezia 02/26/2015  . Ischemic colitis ([place]) 02/26/2015  . Orthostatic hypotension 02/26/2015  . Mixed hyperlipidemia 08/19/2013  . Fibrocystic breast disease 08/19/2013  . History of migraine  headaches 08/19/2013  . GERD (gastroesophageal reflux disease) 04/07/2013  . Osteoporosis, postmenopausal 04/07/2013    Past Surgical History:  Procedure Laterality Date  . ABDOMINAL HYSTERECTOMY    . BIOPSY  07/12/2019   Procedure: BIOPSY;  Surgeon: Rogene Houston, MD;  [facility];  Service: Endoscopy;;  duodenum  .  COLONOSCOPY    . COLONOSCOPY N/A 02/17/2015   Procedure: COLONOSCOPY;  Surgeon: Rogene Houston, MD;  [facility];  Service: Endoscopy;  Laterality: N/A;  110n - moved to 11:15 - Ann to notify pt  . COLONOSCOPY N/A 07/12/2019   Procedure: COLONOSCOPY;  Surgeon: Rogene Houston, MD;  [facility];  Service: Endoscopy;  Laterality: N/A;  . ESOPHAGOGASTRODUODENOSCOPY    . ESOPHAGOGASTRODUODENOSCOPY N/A 01/29/2016   Procedure: ESOPHAGOGASTRODUODENOSCOPY (EGD);  Surgeon: Rogene Houston, MD;  [facility];  Service: Endoscopy;  Laterality: N/A;  . ESOPHAGOGASTRODUODENOSCOPY N/A 10/14/2017   Procedure: ESOPHAGOGASTRODUODENOSCOPY (EGD);  Surgeon: Rogene Houston, MD;  [facility];  Service: Endoscopy;  Laterality: N/A;  730  . ESOPHAGOGASTRODUODENOSCOPY N/A 07/12/2019   Procedure: ESOPHAGOGASTRODUODENOSCOPY (EGD);  Surgeon: Rogene Houston, MD;  [facility];  Service: Endoscopy;  Laterality: N/A;  . ESOPHAGOGASTRODUODENOSCOPY N/A 10/03/2019   Procedure: ESOPHAGOGASTRODUODENOSCOPY (EGD);  Surgeon: Rogene Houston, MD;  [facility];  Service: Endoscopy;  Laterality: N/A;  . EYE SURGERY Right 3/15   cataracts  . GIVENS CAPSULE STUDY N/A 07/29/2019   Procedure: GIVENS CAPSULE STUDY;  Surgeon: Rogene Houston, MD;  [facility];  Service: Endoscopy;  Laterality: N/A;  . GIVENS CAPSULE STUDY N/A 08/20/2019   Procedure: GIVENS CAPSULE STUDY;  Surgeon: Rogene Houston, MD;  [facility];  Service: Endoscopy;  Laterality: N/A;  730am  . GIVENS CAPSULE STUDY  10/03/2019   Procedure: GIVENS CAPSULE STUDY;  Surgeon: Rogene Houston, MD;  [facility];  Service: Endoscopy;;  . INTRAMEDULLARY (IM) NAIL INTERTROCHANTERIC Left 01/07/2019   Procedure: INTRAMEDULLARY (IM) NAIL INTERTROCHANTRIC;  Surgeon: Carole Civil, MD;  Location: [facility];  Service: Orthopedics;  Laterality: Left;  . KYPHOPLASTY N/A 07/08/2014    Procedure: Thoracic Eleven Kyphoplasty;  Surgeon: Hosie Spangle, MD;  Location: [facility];  Service: Neurosurgery;  Laterality: N/A;  . left hip surgery    . LUMBAR LAMINECTOMY/DECOMPRESSION MICRODISCECTOMY Bilateral 05/20/2013   Procedure: LUMBAR LAMINECTOMY/DECOMPRESSION MICRODISCECTOMY 1 LEVEL;  Surgeon: Hosie Spangle, MD;  Location: [facility];  Service: Neurosurgery;  Laterality: Bilateral;  Bilateral Lumbar four-five laminotomy and left lumbar four-five microdiskectomy  . RIGHT/LEFT HEART CATH AND CORONARY ANGIOGRAPHY N/A 03/31/2019   Procedure: RIGHT/LEFT HEART CATH AND CORONARY ANGIOGRAPHY;  Surgeon: Sherren Mocha, MD;  Location: [facility];  Service: Cardiovascular;  Laterality: N/A;  . SPINE SURGERY     Dr Carloyn Manner -  vertebraplasty     OB History   No obstetric history on file.     Family History  Problem Relation Age of Onset  . Hypertension Mother   . Heart attack Mother   . Macular degeneration Father   . Heart disease Father   . Cirrhosis Son   . Heart disease Son   . Colon cancer Neg Hx     Social History   Tobacco Use  . Smoking status: Current Some Day Smoker    Packs/day: 1.00    Years: 52.00    Pack years: 52.00    Types: Cigarettes  Start date: 11/18/1958    Last attempt to quit: 08/20/2011    Years since quitting: 9.2  . Smokeless tobacco: Never Used  . Tobacco comment: 1/2-1pack off and on all her life  Vaping Use  . Vaping Use: Never used  Substance Use Topics  . Alcohol use: No    Alcohol/week: 0.0 standard drinks  . Drug use: No    Home Medications Prior to Admission medications   Medication Sig Start Date End Date Taking? Authorizing Provider  acetaminophen (TYLENOL) 500 MG tablet Take 500 mg by mouth every 8 (eight) hours as needed for mild pain, moderate pain or headache.  01/20/19   [provider]  albuterol (VENTOLIN HFA) 108 (90 Base) MCG/ACT inhaler Inhale 2 puffs into the lungs every 6 (six) hours as needed  for wheezing or shortness of breath (cough). 04/20/20   Claretta Fraise, MD  ALPRAZolam Duanne Moron) 0.5 MG tablet Take 1 tablet (0.5 mg total) by mouth 2 (two) times daily. 10/26/20   Claretta Fraise, MD  bisoprolol (ZEBETA) 5 MG tablet Take 1 tablet (5 mg total) by mouth daily. 05/19/20   Minus Breeding, MD  cholecalciferol (VITAMIN D) 1000 UNITS tablet Take 2,000 Units by mouth daily.     [provider]  escitalopram (LEXAPRO) 20 MG tablet Take 1 tablet (20 mg total) by mouth at bedtime. 04/20/20   Claretta Fraise, MD  furosemide (LASIX) 20 MG tablet Take 1 tablet (20 mg total) by mouth daily. 04/20/20   Claretta Fraise, MD  lidocaine (XYLOCAINE) 2 % solution Use as directed 15 mLs in the mouth or throat every 6 (six) hours as needed for mouth pain. Nothing to eat or drink for 30 minutes after use Patient not taking: No sig reported 04/20/20   Claretta Fraise, MD  metoCLOPramide (REGLAN) 5 MG tablet Take 1 tablet (5 mg total) by mouth 3 (three) times daily before meals. Patient not taking: Reported on 10/26/2020 08/22/19   Rogene Houston, MD  Multiple Vitamin (MULTIVITAMIN WITH MINERALS) TABS tablet Take 1 tablet by mouth daily. Centrum Silver    [provider]  nystatin (MYCOSTATIN) 100000 UNIT/ML suspension Take 5 mLs (500,000 Units total) by mouth 4 (four) times daily. Patient not taking: No sig reported 02/10/20   Baruch Gouty, FNP  pantoprazole (PROTONIX) 40 MG tablet Take 1 tablet (40 mg total) by mouth 2 (two) times daily. 10/26/20   Claretta Fraise, MD  potassium chloride SA (KLOR-CON) 20 MEQ tablet Take 1 tablet (20 mEq total) by mouth daily 07/20/20   Claretta Fraise, MD  traZODone (DESYREL) 150 MG tablet Use from 1/3 to 1 tablet nightly as needed for sleep. 07/20/20   Claretta Fraise, MD  vitamin B-12 (CYANOCOBALAMIN) 1000 MCG tablet Take 1,000 mcg by mouth daily.    [provider]    Allergies    Codeine, Tramadol, Asa [aspirin], Azithromycin, Celebrex [celecoxib], Cymbalta  [duloxetine hcl], Prednisone, Vioxx [rofecoxib], Zelnorm [tegaserod], and Zocor [simvastatin]  Review of Systems   Review of Systems Ten systems are reviewed and are negative for acute change except as noted in the HPI  Physical Exam Updated Vital Signs BP 130/77   Pulse 98   Temp 98.1 F (36.7 C) (Oral)   Resp (!) 24   Ht 5' 7"  (1.702 m)   Wt 54.9 kg   SpO2 91%   BMI 18.95 kg/m   Physical Exam Constitutional:      General: She is not in acute distress.    Appearance: Normal  appearance. She is well-developed. She is not ill-appearing or diaphoretic.  HENT:     Head: Normocephalic and atraumatic.  Eyes:     General: Vision grossly intact. Gaze aligned appropriately.     Pupils: Pupils are equal, round, and reactive to light.  Neck:     Trachea: Trachea and phonation normal.  Pulmonary:     Effort: Pulmonary effort is normal. No respiratory distress.  Abdominal:     General: There is no distension.     Palpations: Abdomen is soft.     Tenderness: There is no abdominal tenderness. There is no guarding or rebound.  Genitourinary:    Comments: Rectal examination chaperoned by Winfred Burn.  Nonthrombosed, nonbleeding external hemorrhoid.  Normal rectal tone.  No palpable internal hemorrhoids.  No gross blood on examination. Musculoskeletal:        General: Normal range of motion.     Cervical back: Normal range of motion.  Skin:    General: Skin is warm and dry.  Neurological:     Mental Status: She is alert.     GCS: GCS eye subscore is 4. GCS verbal subscore is 5. GCS motor subscore is 6.     Comments: Speech is clear and goal oriented, follows commands Major Cranial nerves without deficit, no facial droop Moves extremities without ataxia, coordination intact  Psychiatric:        Behavior: Behavior normal.     ED Results / Procedures / Treatments   Labs (all labs ordered are listed, but only abnormal results are displayed) Labs Reviewed  CBC WITH  DIFFERENTIAL/PLATELET - Abnormal; Notable for the following components:      Result Value   RBC 2.93 (*)    Hemoglobin 6.2 (*)    HCT 24.0 (*)    MCH 21.2 (*)    MCHC 25.8 (*)    RDW 20.5 (*)    Eosinophils Absolute 0.7 (*)    All other components within normal limits  BASIC METABOLIC PANEL - Abnormal; Notable for the following components:   Glucose, Bld 106 (*)    Calcium 8.4 (*)    All other components within normal limits  POC OCCULT BLOOD, ED - Abnormal; Notable for the following components:   Fecal Occult Bld POSITIVE (*)    All other components within normal limits  RESP PANEL BY RT-PCR (FLU A&B, COVID) ARPGX2  TYPE AND SCREEN  PREPARE RBC (CROSSMATCH)    EKG None  Radiology DG Chest Portable 1 View  Result Date: 10/30/2020 CLINICAL DATA:  Weakness, anemia. EXAM: PORTABLE CHEST 1 VIEW COMPARISON:  July 10, 2019. FINDINGS: Stable cardiomegaly. No pneumothorax is noted. No pleural effusion is noted. Increased left basilar opacity is noted concerning for atelectasis or infiltrate. Stable probable scarring seen in right lung. Status post kyphoplasty at multiple levels of the lower thoracic and lumbar spine. IMPRESSION: Increased left basilar opacity is noted concerning for atelectasis or infiltrate. Aortic Atherosclerosis (ICD10-I70.0). Electronically Signed   By: Marijo Conception M.D.   On: 10/30/2020 15:23    Procedures .Critical Care Performed by: Deliah Boston, PA-C Authorized by: Deliah Boston, PA-C   Critical care provider statement:    Critical care time (minutes):  35   Critical care was necessary to treat or prevent imminent or life-threatening deterioration of the following conditions: Symptomatic Anemia.   Critical care was time spent personally by me on the following activities:  Discussions with consultants, evaluation of patient's response to treatment, examination of patient, ordering  and performing treatments and interventions, ordering and review  of laboratory studies, ordering and review of radiographic studies, pulse oximetry, re-evaluation of patient's condition, obtaining history from patient or surrogate, review of old charts and development of treatment plan with patient or surrogate   (including critical care time)  Medications Ordered in ED Medications  0.9 %  sodium chloride infusion (Manually program via Guardrails IV Fluids) (has no administration in time range)    ED Course  I have reviewed the triage vital signs and the nursing notes.  Pertinent labs & imaging results that were available during my care of the patient were reviewed by me and considered in my medical decision making (see chart for details).  Clinical Course as of 10/30/20 1644  Mon Oct 30, 2020  1603 Vicente Males [BM]    Clinical Course User Index [BM] Gari Crown   MDM Rules/Calculators/A&P                         Additional history obtained from: 1. Nursing notes from this visit. 2. Review of electronic medical records.  3. Family at bedside. ---------------- 79 year old female presented for concern of anemia today sent by PCPs office.  She reports she has been feeling fatigued no chest pain or shortness of breath.  On arrival she is noted be tachycardic but denies any palpitations.  Hemoccult positive but no gross blood on exam.  Blood work obtained in the ER today shows hemoglobin of 6.2.  No leukocytosis.  No emergent electrolyte derangement, AKI or gap.  Covid/influenza panel negative.  Type and screen is O+.  Patient will need transfusion due to symptomatic anemia.  Will consult hospitalist service and GI. - 4:03 PM: Consulted gastroenterology APP Vicente Males who agrees with hospitalist admission and will come to see patient. - Patient reassessed she is resting comfortably in bed no acute distress she is agreeable for transfusion and admission she has no further complaints or concerns vital signs stable on room air SPO2 94%. - 5:01 PM:  Consulted Dr. Denton Brick, patient accepted to hospitalist service.    Note: Portions of this report may have been transcribed using voice recognition software. Every effort was made to ensure accuracy; however, inadvertent computerized transcription errors may still be present. Final Clinical Impression(s) / ED Diagnoses Final diagnoses:  Symptomatic anemia  Gastrointestinal hemorrhage, unspecified gastrointestinal hemorrhage type    Rx / DC Orders ED Discharge Orders    None       Gari Crown 10/30/20 1712    Davonna Belling, MD 10/31/20 0700

## 2020-10-30 NOTE — ED Notes (Signed)
Pt placed on a purewick and brief changed

## 2020-10-30 NOTE — ED Triage Notes (Signed)
Pt states she was seen by her pcp on Thursday and had blood drawn and was told today that her hemoglobin was 6 and to come to ED.

## 2020-10-31 ENCOUNTER — Inpatient Hospital Stay (HOSPITAL_COMMUNITY): Payer: Medicare Other | Admitting: Anesthesiology

## 2020-10-31 ENCOUNTER — Encounter (HOSPITAL_COMMUNITY): Payer: Self-pay | Admitting: Internal Medicine

## 2020-10-31 ENCOUNTER — Encounter (HOSPITAL_COMMUNITY)
Admission: EM | Disposition: A | Discharge status: Home / Self Care | Payer: Self-pay | Source: Home / Self Care | Attending: Internal Medicine

## 2020-10-31 DIAGNOSIS — K31819 Angiodysplasia of stomach and duodenum without bleeding: Secondary | ICD-10-CM

## 2020-10-31 DIAGNOSIS — K449 Diaphragmatic hernia without obstruction or gangrene: Secondary | ICD-10-CM

## 2020-10-31 DIAGNOSIS — K209 Esophagitis, unspecified without bleeding: Secondary | ICD-10-CM

## 2020-10-31 DIAGNOSIS — D509 Iron deficiency anemia, unspecified: Secondary | ICD-10-CM

## 2020-10-31 DIAGNOSIS — T182XXA Foreign body in stomach, initial encounter: Secondary | ICD-10-CM

## 2020-10-31 DIAGNOSIS — K3189 Other diseases of stomach and duodenum: Secondary | ICD-10-CM

## 2020-10-31 DIAGNOSIS — K317 Polyp of stomach and duodenum: Secondary | ICD-10-CM

## 2020-10-31 HISTORY — PX: HOT HEMOSTASIS: SHX5433

## 2020-10-31 HISTORY — PX: BIOPSY: SHX5522

## 2020-10-31 HISTORY — PX: ESOPHAGOGASTRODUODENOSCOPY (EGD) WITH PROPOFOL: SHX5813

## 2020-10-31 LAB — CBC
HCT: 32 % — ABNORMAL LOW (ref 36.0–46.0)
Hemoglobin: 9.2 g/dL — ABNORMAL LOW (ref 12.0–15.0)
MCH: 23.8 pg — ABNORMAL LOW (ref 26.0–34.0)
MCHC: 28.8 g/dL — ABNORMAL LOW (ref 30.0–36.0)
MCV: 82.9 fL (ref 80.0–100.0)
Platelets: 351 10*3/uL (ref 150–400)
RBC: 3.86 MIL/uL — ABNORMAL LOW (ref 3.87–5.11)
RDW: 19.3 % — ABNORMAL HIGH (ref 11.5–15.5)
WBC: 7.7 10*3/uL (ref 4.0–10.5)
nRBC: 0 % (ref 0.0–0.2)

## 2020-10-31 LAB — TYPE AND SCREEN
ABO/RH(D): O POS
Antibody Screen: NEGATIVE
Unit division: 0
Unit division: 0

## 2020-10-31 LAB — FERRITIN: Ferritin: 8 ng/mL — ABNORMAL LOW (ref 11–307)

## 2020-10-31 LAB — IRON AND TIBC
Iron: 13 ug/dL — ABNORMAL LOW (ref 28–170)
Saturation Ratios: 4 % — ABNORMAL LOW (ref 10.4–31.8)
TIBC: 341 ug/dL (ref 250–450)
UIBC: 328 ug/dL

## 2020-10-31 LAB — BPAM RBC
Blood Product Expiration Date: 202201192359
Blood Product Expiration Date: 202201192359
ISSUE DATE / TIME: 202112131656
ISSUE DATE / TIME: 202112132004
Unit Type and Rh: 5100
Unit Type and Rh: 5100

## 2020-10-31 LAB — HEMOGLOBIN AND HEMATOCRIT, BLOOD
HCT: 31 % — ABNORMAL LOW (ref 36.0–46.0)
Hemoglobin: 9 g/dL — ABNORMAL LOW (ref 12.0–15.0)

## 2020-10-31 SURGERY — ESOPHAGOGASTRODUODENOSCOPY (EGD) WITH PROPOFOL
Anesthesia: General

## 2020-10-31 MED ORDER — PROPOFOL 10 MG/ML IV BOLUS
INTRAVENOUS | Status: DC | PRN
  Administered 2020-10-31: 30 mg via INTRAVENOUS
  Administered 2020-10-31 (×3): 20 mg via INTRAVENOUS
  Administered 2020-10-31: 50 mg via INTRAVENOUS
  Administered 2020-10-31: 40 mg via INTRAVENOUS
  Administered 2020-10-31: 20 mg via INTRAVENOUS

## 2020-10-31 MED ORDER — PANTOPRAZOLE SODIUM 40 MG PO TBEC
40.0000 mg | DELAYED_RELEASE_TABLET | Freq: Two times a day (BID) | ORAL | Status: DC
  Administered 2020-10-31 – 2020-11-01 (×2): 40 mg via ORAL
  Filled 2020-10-31 (×3): qty 1

## 2020-10-31 MED ORDER — LIDOCAINE HCL (CARDIAC) PF 100 MG/5ML IV SOSY
PREFILLED_SYRINGE | INTRAVENOUS | Status: DC | PRN
  Administered 2020-10-31 (×2): 40 mg via INTRAVENOUS

## 2020-10-31 MED ORDER — PHENYLEPHRINE HCL (PRESSORS) 10 MG/ML IV SOLN
INTRAVENOUS | Status: DC | PRN
  Administered 2020-10-31 (×5): 80 ug via INTRAVENOUS

## 2020-10-31 MED ORDER — SODIUM CHLORIDE 0.9 % IV SOLN: INTRAVENOUS | Status: DC

## 2020-10-31 MED ORDER — LACTATED RINGERS IV SOLN
INTRAVENOUS | Status: DC
  Administered 2020-10-31: 11:00:00 1000 mL via INTRAVENOUS

## 2020-10-31 MED ORDER — SODIUM CHLORIDE 0.9 % IV SOLN
510.0000 mg | Freq: Once | INTRAVENOUS | Status: AC
  Administered 2020-10-31: 22:00:00 510 mg via INTRAVENOUS
  Filled 2020-10-31: qty 17

## 2020-10-31 MED ORDER — FERUMOXYTOL INJECTION 510 MG/17 ML
INTRAVENOUS | Status: AC
  Filled 2020-10-31: qty 17

## 2020-10-31 NOTE — Transfer of Care (Signed)
Immediate Anesthesia Transfer of Care Note  Patient: Misty [name]  Procedure(s) Performed: ESOPHAGOGASTRODUODENOSCOPY (EGD) WITH PROPOFOL (N/A ) BIOPSY HOT HEMOSTASIS (ARGON PLASMA COAGULATION/BICAP)  Patient Location: PACU  Anesthesia Type:General  Level of Consciousness: awake and alert   Airway & Oxygen Therapy: Patient Spontanous Breathing and Patient connected to nasal cannula oxygen  Post-op Assessment: Report given to RN and Post -op Vital signs reviewed and stable  Post vital signs: Reviewed and stable  Last Vitals:  Vitals Value Taken Time  BP    Temp    Pulse    Resp    SpO2      Last Pain:  Vitals:   10/31/20 1128  TempSrc:   PainSc: 0-No pain      Patients Stated Pain Goal: 6 (97/87/76 5486)  Complications: No complications documented.

## 2020-10-31 NOTE — Anesthesia Preprocedure Evaluation (Signed)
Anesthesia Evaluation  Patient identified by MRN, date of birth, ID band Patient awake    Reviewed: Allergy & Precautions, H&P , NPO status , Patient's Chart, lab work & pertinent test results, reviewed documented beta blocker date and time   History of Anesthesia Complications (+) PONV and history of anesthetic complications  Airway Mallampati: II  TM Distance: >3 FB Neck ROM: full    Dental no notable dental hx. (+) Teeth Intact   Pulmonary pneumonia, resolved, COPD,  COPD inhaler, Current Smoker,    Pulmonary exam normal breath sounds clear to auscultation       Cardiovascular Exercise Tolerance: Good + Peripheral Vascular Disease   Rhythm:regular Rate:Normal     Neuro/Psych  Headaches, PSYCHIATRIC DISORDERS Anxiety Depression    GI/Hepatic Neg liver ROS, GERD  Medicated,  Endo/Other  negative endocrine ROS  Renal/GU negative Renal ROS  negative genitourinary   Musculoskeletal   Abdominal   Peds  Hematology  (+) Blood dyscrasia, anemia ,   Anesthesia Other Findings   Reproductive/Obstetrics negative OB ROS                             Anesthesia Physical Anesthesia Plan  ASA: III and emergent  Anesthesia Plan: General   Post-op Pain Management:    Induction:   PONV Risk Score and Plan: Propofol infusion  Airway Management Planned:   Additional Equipment:   Intra-op Plan:   Post-operative Plan:   Informed Consent: I have reviewed the patients History and Physical, chart, labs and discussed the procedure including the risks, benefits and alternatives for the proposed anesthesia with the patient or authorized representative who has indicated his/her understanding and acceptance.     Dental Advisory Given  Plan Discussed with: CRNA  Anesthesia Plan Comments:         Anesthesia Quick Evaluation

## 2020-10-31 NOTE — Progress Notes (Signed)
Patient stated she did not want to have givens capsule study done, when nurse was trying to apply the belt for the study. Patient stated she had already had that done before. Doctor was notified by nurse Janeece Riggers, RN.

## 2020-10-31 NOTE — Op Note (Signed)
[facility] Patient Name: Misty [name] Procedure Date: 10/31/2020 11:06 AM MRN: [id number] Date of Birth: 1941/08/25 Attending MD: Maylon Peppers ,  CSN: [id number] Age: 79 Admit Type: Inpatient Procedure:                Upper GI endoscopy Indications:              Iron deficiency anemia Providers:                Maylon Peppers, Crystal Page, Taylors Risa Grill, Technician, Nelma Rothman, Merchant [employer] officer Referring MD:              Medicines:                Monitored Anesthesia Care Complications:            No immediate complications. Estimated Blood Loss:     Estimated blood loss: none. Procedure:                Pre-Anesthesia Assessment:                           - Prior to the procedure, a History and Physical                            was performed, and patient medications, allergies                            and sensitivities were reviewed. The patient's                            tolerance of previous anesthesia was reviewed.                           - The risks and benefits of the procedure and the                            sedation options and risks were discussed with the                            patient. All questions were answered and informed                            consent was obtained.                           - ASA Grade Assessment: III - A patient with severe                            systemic disease.                           After obtaining informed consent, the endoscope was                            passed under direct vision. Throughout the  procedure, the patient's blood pressure, pulse, and                            oxygen saturations were monitored continuously. The                            [id number]-[id number] ([id number]) scope was introduced through the                            mouth, and advanced to the second part of duodenum.                            The upper GI endoscopy was accomplished  without                            difficulty. The patient tolerated the procedure                            well. Scope In: 11:33:06 AM Scope Out: 12:01:32 PM Total Procedure Duration: 0 hours 28 minutes 26 seconds  Findings:      LA Grade D (one or more mucosal breaks involving at least 75% of       esophageal circumference) esophagitis with no bleeding was found 30 to       36 cm from the incisors.      A 2 cm hiatal hernia was present.      Two 2-5 mm sessile polyps with no bleeding but inflammatory appearance       were found below the EG junction in the hiatal hernia.      A small amount of food (residue) was found in the gastric body. Removal       of food was accomplished with a Roth net. No lesions were observed       underneat the food.      Mild gastric antral vascular ectasia without bleeding was present in the       gastric antrum. Coagulation for bleeding prevention using argon plasma       at 0.3 liters/minute and 20 watts was successful.      Diffuse severely scalloped mucosa was found in the entire examined       duodenum. Biopsies were taken with a cold forceps for histology. Impression:               - LA Grade D esophagitis with no bleeding.                           - 2 cm hiatal hernia.                           - Two polyps below GE junction.                           - A small amount of food (residue) in the stomach.                            Removal was successful.                           -  Gastric antral vascular ectasia without bleeding.                            Treated with argon plasma coagulation (APC).                           - Scalloped mucosa was found in the duodenum,                            consistent with celiac disease. Biopsied. Moderate Sedation:      Per Anesthesia Care Recommendation:           - Return patient to hospital ward for ongoing care.                           - Resume previous diet - must be gluten free.                            - Await pathology results.                           - Use Prilosec (omeprazole) 40 mg PO BID for 3                            months, then once a day indefinitely.                           - Repeat upper endoscopy in 3 months for                            surveillance to assess area of esophagitis.                           - Check H/H daily Procedure Code(s):        --- Professional ---                           76811, 59,GC, Esophagogastroduodenoscopy, flexible,                            transoral; with control of bleeding, any method                           43239, Esophagogastroduodenoscopy, flexible,                            transoral; with biopsy, single or multiple Diagnosis Code(s):        --- Professional ---                           K20.90, Esophagitis, unspecified without bleeding                           K44.9, Diaphragmatic hernia without obstruction or  gangrene                           K31.7, Polyp of stomach and duodenum                           T18.2XXA, Foreign body in stomach, initial encounter                           K31.819, Angiodysplasia of stomach and duodenum                            without bleeding                           K31.89, Other diseases of stomach and duodenum                           D50.9, Iron deficiency anemia, unspecified CPT copyright 2019 American Medical Association. All rights reserved. The codes documented in this report are preliminary and upon coder review may  be revised to meet current compliance requirements. Maylon Peppers, MD Maylon Peppers,  10/31/2020 12:24:18 PM This report has been signed electronically. Number of Addenda: 0

## 2020-10-31 NOTE — Brief Op Note (Signed)
10/30/2020 - 10/31/2020  12:07 PM  PATIENT:  Misty [name]  79 y.o. female  PRE-OPERATIVE DIAGNOSIS:  acute on chronic symptomatic anemia; heme positive stool; history of celiac disease  POST-OPERATIVE DIAGNOSIS:  6 cm esophagitis; hiatal hernia; small bowel biopsy evaluate celiac disease;   PROCEDURE:  Procedure(s): ESOPHAGOGASTRODUODENOSCOPY (EGD) WITH PROPOFOL (N/A) BIOPSY HOT HEMOSTASIS (ARGON PLASMA COAGULATION/BICAP)  SURGEON:  Surgeon(s) and Role:    * Harvel Quale, MD - Primary  Performed esophagogastroduodenospy under propofol sedation.  Patient was found to have severe esophagitis grade D extending from 30 to 36 cm from the incisors.  Was also found to have a hiatal hernia of 2 cm.  Below the GE junction she had 2 small polyps that looked inflammatory in nature.  The stomach had presence of food contents that were removed with Jabier Mutton net successfully.  No lesions were seen below the foot.  However, there was presence of mild changes consistent with GAVE in her antrum, which were ablated with APC.  Duodenum had presence of diffuse scalloping, biopsies were performed to assess celiac disease activity.  RECOMMENDATIONS - Return patient to hospital ward - Check H/H daily  - Resume previous diet - must be gluten free.  - Await pathology results.  - Use Prilosec (omeprazole) 40 mg PO BID for 3 months, then once a day indefinitely.  - Repeat upper endoscopy in 3 months for surveillance to assess area of esophagitis.   Maylon Peppers, MD Gastroenterology and Hepatology [facility]

## 2020-10-31 NOTE — Progress Notes (Addendum)
We will proceed with EGD as scheduled.  I thoroughly discussed with the patient his procedure, including the risks involved. Patient understands what the procedure involves including the benefits and any risks. Patient understands alternatives to the proposed procedure. Risks including (but not limited to) bleeding, tearing of the lining (perforation), rupture of adjacent organs, problems with heart and lung function, infection, and medication reactions. A small percentage of complications may require surgery, hospitalization, repeat endoscopic procedure, and/or transfusion.  Patient understood and agreed.  The patient adamantly refused to undergo a repeat capsule endoscopy as she has had these in the past and would not like to have these done during the current hospitalization.   Notably, the patient reported that she has not been compliant with her gluten-free diet.  Maylon Peppers, MD Gastroenterology and Hepatology [facility]  ----     Subjective: Feeling fine this morning.  No overt GI bleeding.  No abdominal pain, nausea, or vomiting since admission.  History of reflux, intermittent nausea/vomiting, intermittent diarrhea if she eats food items that contain gluten. No SOB. Intermittent cough.   Objective: Vital signs in last 24 hours: Temp:  [97.8 F (36.6 C)-98.6 F (37 C)] 98.6 F (37 C) (12/13 2033) Pulse Rate:  [72-138] 82 (12/14 0700) Resp:  [18-28] 21 (12/14 0700) BP: (98-164)/(59-90) 127/66 (12/14 0700) SpO2:  [88 %-95 %] 92 % (12/14 0700) Weight:  [54.9 kg] 54.9 kg (12/13 1342)   General:   Alert and oriented, pleasant Head:  Normocephalic and atraumatic. Eyes:  No icterus, sclera clear. Conjuctiva pink.  Abdomen:  Bowel sounds present, soft, non-tender, non-distended. No HSM or hernias noted. No rebound or guarding. No masses appreciated  Msk:  Symmetrical without gross deformities.  Extremities:  Without edema. Neurologic:   Alert and  oriented x4;  grossly normal neurologically. Psych:  Normal mood and affect.  Intake/Output from previous day: 12/13 0701 - 12/14 0700 In: 1824.9 [Blood:1575; IV Piggyback:249.9] Out: -  Intake/Output this shift: No intake/output data recorded.  Lab Results: Recent Labs    10/30/20 1523 10/31/20 0115  WBC 8.0  --   HGB 6.2* 9.0*  HCT 24.0* 31.0*  PLT 335  --    BMET Recent Labs    10/30/20 1523  NA 139  K 4.2  CL 102  CO2 30  GLUCOSE 106*  BUN 13  CREATININE 0.86  CALCIUM 8.4*    Studies/Results: DG Chest Portable 1 View  Result Date: 10/30/2020 CLINICAL DATA:  Weakness, anemia. EXAM: PORTABLE CHEST 1 VIEW COMPARISON:  July 10, 2019. FINDINGS: Stable cardiomegaly. No pneumothorax is noted. No pleural effusion is noted. Increased left basilar opacity is noted concerning for atelectasis or infiltrate. Stable probable scarring seen in right lung. Status post kyphoplasty at multiple levels of the lower thoracic and lumbar spine. IMPRESSION: Increased left basilar opacity is noted concerning for atelectasis or infiltrate. Aortic Atherosclerosis (ICD10-I70.0). Electronically Signed   By: Marijo Conception M.D.   On: 10/30/2020 15:23    Assessment: 79 year old female with history significant for celiac disease, chronic GERD, chronic N/V (query delayed gastric emptying), transfusion dependent anemia/IDA with extensive evaluation last year revealing hypertensive gastropathy and GAVE without bleeding, scalloped mucosa of duodenum consistent with celiac disease on EGD, GIVENS with 2 small ulcers in distal small bowel/ileum felt to be secondary to ongoing small bowel injury in setting of celiac disease and non-compliance with gluten-free diet. TCS without significant findings. She is now presenting with symptomatic acute on chronic anemia and  heme positive stool without overt GI bleeding.   Hgb was 6.6 through PCP last week, 6.2 on admission. Previously 9.7 in Sept 2021.  From review of records, she has been in the 7-9 range earlier this year. S/p 2 units PRBCs with hemoglobin up to 9.2.  No overt GI bleeding.  Notably, non-compliance with gluten-free diet is still an issue. Also, noncompliant with daily iron supplement. Denies NSAIDs.   Acute on chronic anemia likely multifactorial.  Suspect this is predominantly secondary to small bowel etiology with known ulcers in the distal small bowel/ileum on prior Capsule in November 2020 in the setting of ongoing noncompliance with gluten-free diet.  Also with malabsorption in the setting of celiac disease.  GAVE is also likely a contributor. Can't rule out PUD or AMVs.   We will plan for EGD today with possible Givens Capsule pending EGD findings. If Capsule is recommended, it would need to be placed endoscopically as it did not pass from the stomach historically.   Of note, patient has also been diagnosed with community acquired pneumonia but is doing well on room air without shortness of breath and is stating >92% on room air. Currently receiving ceftriaxone and doxycycline.   Plan: 1.  EGD with propofol and possible Givens capsule with Dr. Jenetta Downer today. The risks, benefits, and alternatives have been discussed with the patient in detail. The patient states understanding and desires to proceed.  2.  Continue IV PPI BID 3.  Update iron panel with ferritin 4.  Continue to monitor H/H and transfuse as necessary. 5.  Monitor for overt GI bleeding. 6.  Counseled on the importance of following a strict gluten-free diet moving foward. 7.  Patient would benefit from following with hematology as an outpatient for IV iron and close monitoring of CBC and iron studies. May need IV iron prior to discharge.      LOS: 1 day    10/31/2020, 7:36 AM   Aliene Altes, [facility]

## 2020-10-31 NOTE — Progress Notes (Signed)
PROGRESS NOTE    Misty [name]  VQX:[id number] DOB: 22-Oct-1941 DOA: 10/30/2020 PCP: Claretta Fraise, MD    Brief Narrative:  79 year old female with history of systolic congestive heart failure, paroxysmal A. fib, GI bleeding, ischemic colitis and COPD.  Multiple previous admissions for GI bleeding symptomatic anemia and chronic iron deficiency anemia.  History of GAVE and celiac disease.  Patient also complained of 7 days of cough with mucoid sputum production.  No fever at home.  In the emergency room hemodynamically stable.  Heart rate 90-120.  Blood pressure stable.  93% on room air.  Hemoglobin 6.2.  EKG with sinus tachycardia.  Chest x-ray with left basilar opacity concerning for atelectasis or infiltrate.  2 units PRBC transfusion ordered and patient admitted to the hospital.   Assessment & Plan:   Principal Problem:   Symptomatic anemia Active Problems:   Pulmonary fibrosis (HCC)   Depression, recurrent (HCC)   Paroxysmal atrial fibrillation (HCC)   Chronic systolic HF (heart failure) (HCC)   PNA (pneumonia)  Symptomatic anemia:  Acute on chronic blood loss anemia due to acute on chronic GI blood loss. Status post EGD, esophagitis and gastritis, no active bleeding.  Suggested Protonix 40 mg twice daily. Received 2 units of PRBC with appropriate response, will recheck tomorrow morning. Chronic iron deficiency anemia, will benefit with additional IV iron while in the hospital.  We will transfuse 1 unit of iron today.  Community-acquired pneumonia: Suspected.  Productive cough for 1 week.  WBC count normal.  Her left lower lobe findings are significant.  Treated with doxycycline for 7 days.  Breathing treatment, incentive spirometry and deep breathing exercises. Her left lower lobe findings could be also from pulmonary fibrosis.  Chronic systolic congestive heart failure: Stable and compensated.  None ejection fraction 35%.  Given a dose of Lasix with blood transfusion.  Resume  home dose of Lasix.  Paroxysmal A. fib: Currently in sinus rhythm.  Resume bisoprolol.  Not an anticoagulation candidate.  Anxiety/depression: Resume Xanax that she takes at home.     DVT prophylaxis: SCDs Start: 10/30/20 2002   Code Status: Full code Family Communication: None Disposition Plan: Status is: Inpatient  Remains inpatient appropriate because:Inpatient level of care appropriate due to severity of illness   Dispo: The patient is from: Home              Anticipated d/c is to: Home              Anticipated d/c date is: 2 days              Patient currently is not medically stable to d/c.         Consultants:   Gastroenterology  Procedures:   Upper GI endoscopy  Antimicrobials:  Anti-infectives (From admission, onward)   Start     Dose/Rate Route Frequency Ordered Stop   10/30/20 2130  [MAR Hold]  cefTRIAXone (ROCEPHIN) 2 g in sodium chloride 0.9 % 100 mL IVPB        (MAR Hold since Tue 10/31/2020 at 1038.Hold Reason: Transfer to a Procedural area.)   2 g 200 mL/hr over 30 Minutes Intravenous Every 24 hours 10/30/20 2054     10/30/20 2130  [MAR Hold]  doxycycline (VIBRAMYCIN) 100 mg in sodium chloride 0.9 % 250 mL IVPB        (MAR Hold since Tue 10/31/2020 at 1038.Hold Reason: Transfer to a Procedural area.)   100 mg 125 mL/hr over 120 Minutes Intravenous Every 12  hours 10/30/20 2054           Subjective: Patient seen and examined.  She is a still at PACU after undergoing endoscopy.  Waiting for inpatient bed.  On my examination, she was alert awake and hungry.  Coughing with some mucoid sputum but no fever or shortness of breath.  Objective: Vitals:   10/31/20 1210 10/31/20 1300 10/31/20 1400 10/31/20 1500  BP: 118/60 (!) 148/91 (!) 150/83 (!) 161/57  Pulse: 75 76 77 79  Resp: (!) 21 18 16 19   Temp: 97.6 F (36.4 C)     TempSrc:      SpO2: 93% (!) 88% (!) 87% 93%  Weight:      Height:        Intake/Output Summary (Last 24 hours) at  10/31/2020 1709 Last data filed at 10/31/2020 1319 Gross per 24 hour  Intake 2209.91 ml  Output 0 ml  Net 2209.91 ml   Filed Weights   10/30/20 1342 10/31/20 1021  Weight: 54.9 kg 54.9 kg    Examination:  General exam: Appears calm and comfortable  Respiratory system: Clear to auscultation. Respiratory effort normal.  Has some conducted airway sounds. Cardiovascular system: S1 & S2 heard, RRR. No JVD, murmurs, rubs, gallops or clicks. No pedal edema. Gastrointestinal system: Abdomen is nondistended, soft and nontender. No organomegaly or masses felt. Normal bowel sounds heard. Central nervous system: Alert and oriented. No focal neurological deficits. Extremities: Symmetric 5 x 5 power. Skin: No rashes, lesions or ulcers Psychiatry: Judgement and insight appear normal. Mood & affect appropriate.     Data Reviewed: I have personally reviewed following labs and imaging studies  CBC: Recent Labs  Lab 10/26/20 1056 10/30/20 1523 10/31/20 0115 10/31/20 0757  WBC 8.6 8.0  --  7.7  NEUTROABS 6.9 6.0  --   --   HGB 6.6* 6.2* 9.0* 9.2*  HCT 23.6* 24.0* 31.0* 32.0*  MCV 76* 81.9  --  82.9  PLT 345 335  --  701   Basic Metabolic Panel: Recent Labs  Lab 10/26/20 1056 10/30/20 1523  NA 142 139  K 4.4 4.2  CL 100 102  CO2 23 30  GLUCOSE 110* 106*  BUN 14 13  CREATININE 0.92 0.86  CALCIUM 9.0 8.4*   GFR: Estimated Creatinine Clearance: 46 mL/min (by C-G formula based on SCr of 0.86 mg/dL). Liver Function Tests: Recent Labs  Lab 10/26/20 1056  AST 8  ALT 6  ALKPHOS 128*  BILITOT <0.2  PROT 6.6  ALBUMIN 3.6*   No results for input(s): LIPASE, AMYLASE in the last 168 hours. No results for input(s): AMMONIA in the last 168 hours. Coagulation Profile: No results for input(s): INR, PROTIME in the last 168 hours. Cardiac Enzymes: No results for input(s): CKTOTAL, CKMB, CKMBINDEX, TROPONINI in the last 168 hours. BNP (last 3 results) No results for input(s): PROBNP  in the last 8760 hours. HbA1C: No results for input(s): HGBA1C in the last 72 hours. CBG: No results for input(s): GLUCAP in the last 168 hours. Lipid Profile: No results for input(s): CHOL, HDL, LDLCALC, TRIG, CHOLHDL, LDLDIRECT in the last 72 hours. Thyroid Function Tests: No results for input(s): TSH, T4TOTAL, FREET4, T3FREE, THYROIDAB in the last 72 hours. Anemia Panel: Recent Labs    10/30/20 1523  FERRITIN 8*  TIBC 341  IRON 13*   Sepsis Labs: No results for input(s): PROCALCITON, LATICACIDVEN in the last 168 hours.  Recent Results (from the past 240 hour(s))  Resp Panel by RT-PCR (  Flu A&B, Covid) Nasopharyngeal Swab     Status: None   Collection Time: 10/30/20  3:30 PM   Specimen: Nasopharyngeal Swab; Nasopharyngeal(NP) swabs in vial transport medium  Result Value Ref Range Status   SARS Coronavirus 2 by RT PCR NEGATIVE NEGATIVE Final    Comment: (NOTE) SARS-CoV-2 target nucleic acids are NOT DETECTED.  The SARS-CoV-2 RNA is generally detectable in upper respiratory specimens during the acute phase of infection. The lowest concentration of SARS-CoV-2 viral copies this assay can detect is 138 copies/mL. A negative result does not preclude SARS-Cov-2 infection and should not be used as the sole basis for treatment or other patient management decisions. A negative result may occur with  improper specimen collection/handling, submission of specimen other than nasopharyngeal swab, presence of viral mutation(s) within the areas targeted by this assay, and inadequate number of viral copies(<138 copies/mL). A negative result must be combined with clinical observations, patient history, and epidemiological information. The expected result is Negative.  Fact Sheet for Patients:  EntrepreneurPulse.com.au  Fact Sheet for Healthcare Providers:  IncredibleEmployment.be  This test is no t yet approved or cleared by the [place] FDA and   has been authorized for detection and/or diagnosis of SARS-CoV-2 by FDA under an Emergency Use Authorization (EUA). This EUA will remain  in effect (meaning this test can be used) for the duration of the COVID-19 declaration under Section 564(b)(1) of the Act, 21 U.S.C.section 360bbb-3(b)(1), unless the authorization is terminated  or revoked sooner.       Influenza A by PCR NEGATIVE NEGATIVE Final   Influenza B by PCR NEGATIVE NEGATIVE Final    Comment: (NOTE) The Xpert Xpress SARS-CoV-2/FLU/RSV plus assay is intended as an aid in the diagnosis of influenza from Nasopharyngeal swab specimens and should not be used as a sole basis for treatment. Nasal washings and aspirates are unacceptable for Xpert Xpress SARS-CoV-2/FLU/RSV testing.  Fact Sheet for Patients: EntrepreneurPulse.com.au  Fact Sheet for Healthcare Providers: IncredibleEmployment.be  This test is not yet approved or cleared by the [place] FDA and has been authorized for detection and/or diagnosis of SARS-CoV-2 by FDA under an Emergency Use Authorization (EUA). This EUA will remain in effect (meaning this test can be used) for the duration of the COVID-19 declaration under Section 564(b)(1) of the Act, 21 U.S.C. section 360bbb-3(b)(1), unless the authorization is terminated or revoked.  Performed at [facility], [address] [place],  [address]          Radiology Studies: DG Chest Portable 1 View  Result Date: 10/30/2020 CLINICAL DATA:  Weakness, anemia. EXAM: PORTABLE CHEST 1 VIEW COMPARISON:  July 10, 2019. FINDINGS: Stable cardiomegaly. No pneumothorax is noted. No pleural effusion is noted. Increased left basilar opacity is noted concerning for atelectasis or infiltrate. Stable probable scarring seen in right lung. Status post kyphoplasty at multiple levels of the lower thoracic and lumbar spine. IMPRESSION: Increased left basilar opacity is noted  concerning for atelectasis or infiltrate. Aortic Atherosclerosis (ICD10-I70.0). Electronically Signed   By: Marijo Conception M.D.   On: 10/30/2020 15:23        Scheduled Meds: . [MAR Hold] ALPRAZolam  0.5 mg Oral BID  . [MAR Hold] bisoprolol  5 mg Oral Daily  . pantoprazole  40 mg Oral BID  . [MAR Hold] traZODone  150 mg Oral QHS   Continuous Infusions: . sodium chloride    . [MAR Hold] cefTRIAXone (ROCEPHIN)  IV Stopped (10/30/20 2208)  . [MAR Hold] doxycycline (VIBRAMYCIN) IV  100 mg (10/31/20 1000)  . ferumoxytol    . lactated ringers Stopped (10/31/20 1319)     LOS: 1 day    Time spent: 30 minutes    Barb Merino, MD [facility] Pager [phone]

## 2020-10-31 NOTE — Anesthesia Postprocedure Evaluation (Signed)
Anesthesia Post Note  Patient: Misty [name]  Procedure(s) Performed: ESOPHAGOGASTRODUODENOSCOPY (EGD) WITH PROPOFOL (N/A ) BIOPSY HOT HEMOSTASIS (ARGON PLASMA COAGULATION/BICAP)  Patient location during evaluation: PACU Anesthesia Type: General Level of consciousness: awake and oriented Pain management: pain level controlled Vital Signs Assessment: post-procedure vital signs reviewed and stable Respiratory status: spontaneous breathing and respiratory function stable Cardiovascular status: stable and blood pressure returned to baseline Postop Assessment: no apparent nausea or vomiting Anesthetic complications: no   No complications documented.   Last Vitals:  Vitals:   10/31/20 0930 10/31/20 1021  BP: 128/75 134/73  Pulse: 78 75  Resp: (!) 21 19  Temp:  36.9 C  SpO2: 92% 95%    Last Pain:  Vitals:   10/31/20 1128  TempSrc:   PainSc: 0-No pain                 Karna Dupes

## 2020-10-31 NOTE — Plan of Care (Signed)
We will proceed with EGD as scheduled.  I thoroughly discussed with the patient his procedure, including the risks involved. Patient understands what the procedure involves including the benefits and any risks. Patient understands alternatives to the proposed procedure. Risks including (but not limited to) bleeding, tearing of the lining (perforation), rupture of adjacent organs, problems with heart and lung function, infection, and medication reactions. A small percentage of complications may require surgery, hospitalization, repeat endoscopic procedure, and/or transfusion.  Patient understood and agreed.  The patient adamantly refused to undergo a repeat capsule endoscopy as she has had these in the past and would not like to have these done during the current hospitalization.   Notably, the patient reported that she has not been compliant with her gluten-free diet.  Maylon Peppers, MD Gastroenterology and Hepatology [facility]

## 2020-10-31 NOTE — ED Notes (Signed)
Pt bed linen changed, placed on purewick

## 2020-10-31 NOTE — ED Notes (Signed)
Endo RN at bedside to get pt.

## 2020-11-01 ENCOUNTER — Telehealth: Payer: Self-pay | Admitting: Gastroenterology

## 2020-11-01 ENCOUNTER — Other Ambulatory Visit: Payer: Self-pay

## 2020-11-01 LAB — SURGICAL PATHOLOGY

## 2020-11-01 MED ORDER — DOXYCYCLINE MONOHYDRATE 100 MG PO TABS: 100.0000 mg | ORAL_TABLET | Freq: Two times a day (BID) | ORAL | 0 refills | Status: AC

## 2020-11-01 MED ORDER — SODIUM CHLORIDE 0.9 % IV SOLN
510.0000 mg | Freq: Once | INTRAVENOUS | Status: DC
  Filled 2020-11-01: qty 17

## 2020-11-01 MED ORDER — OMEPRAZOLE 40 MG PO CPDR: 40.0000 mg | DELAYED_RELEASE_CAPSULE | Freq: Two times a day (BID) | ORAL | 11 refills | Status: DC

## 2020-11-01 MED ORDER — OMEPRAZOLE 40 MG PO CPDR: 40.0000 mg | DELAYED_RELEASE_CAPSULE | Freq: Every day | ORAL | 11 refills | Status: DC

## 2020-11-01 NOTE — Telephone Encounter (Signed)
Patient needs hospital follow up with Dr. Phillips Climes in 2-3 weeks. (Follow up for IDA, Celiac, severe reflux esophagitis, GAVE).  Patient noncompliant with gluten free diet.  Per Dr. Jenetta Downer, recommends omeprazole 21m BID for 3 months. Repeat EGD in 3 months.   Will need CBC in one week (may be done by PCP), possible iron infusions.

## 2020-11-01 NOTE — Progress Notes (Signed)
Discharge instructions reviewed with patient. Given copy of AVS. Follow-up with PCP and Dr. Laural Golden as instructed. States she will call PCP office and GI office is to call her with appointment. Verbalized understanding of instructions. Prescriptions sent to her pharmacy by provider. States will pick them up from her pharmacy. IV site removed by NT+3, site within normal limits. Patient left unit in stable condition via w/c accompanied by nursing staff. Discharged home.

## 2020-11-01 NOTE — Progress Notes (Signed)
°   11/01/20 0812  Assess: MEWS Score  Temp 98.4 F (36.9 C)  BP 116/68  Pulse Rate (!) 115  Resp 18  SpO2 94 %  O2 Device Room Air  Assess: MEWS Score  MEWS Temp 0  MEWS Systolic 0  MEWS Pulse 2  MEWS RR 0  MEWS LOC 0  MEWS Score 2  MEWS Score Color Yellow  Assess: if the MEWS score is Yellow or Red  Were vital signs taken at a resting state? Yes  Focused Assessment No change from prior assessment  Early Detection of Sepsis Score *See Row Information* Low  MEWS guidelines implemented *See Row Information* Yes  Treat  MEWS Interventions Administered scheduled meds/treatments;Escalated (See documentation below)  Pain Scale 0-10  Pain Score 3  Pain Type Acute pain  Pain Location Head  Pain Descriptors / Indicators Aching  Pain Frequency Intermittent  Pain Onset Gradual  Pain Intervention(s) RN made aware;MD notified (Comment);Emotional support  Multiple Pain Sites No  Take Vital Signs  Increase Vital Sign Frequency  Yellow: Q 2hr X 2 then Q 4hr X 2, if remains yellow, continue Q 4hrs  Escalate  MEWS: Escalate Yellow: discuss with charge nurse/RN and consider discussing with provider and RRT  Notify: Charge Nurse/RN  Name of Charge Nurse/RN Notified amanda loye, RN (notified DD since I am charge RN today)  Date Charge Nurse/RN Notified 11/01/20  Time Charge Nurse/RN Notified [phone]  Notify: Provider  Provider Name/Title Dr. Mahala Menghini  Date Provider Notified 11/01/20  Time Provider Notified [phone]  Notification Type Page  Notification Reason Change in status (HR 115, yellow MEWS)  Response No new orders  Date of Provider Response 11/01/20  Time of Provider Response [phone]

## 2020-11-01 NOTE — Discharge Summary (Addendum)
Physician Discharge Summary  Misty [name] JTT:[id number] DOB: 1941-10-19 DOA: 10/30/2020  PCP: Claretta Fraise, MD  Admit date: 10/30/2020 Discharge date: 11/01/2020  Time spent: 36   Recommendations for Outpatient Follow-up:  1.  needs Chem-12 CBC in 1 week 2. Please coordinate outpatient iron infusions with PCP and gastroenterology Dr. Melony Overly next dose probably in about 2 to 3 weeks and check iron stores 3. Completing 2-3 more days of doxycycline for suspected pneumonia-we will need an x-ray in about 3 to 4 weeks to do note clearing and follow-up with pulmonology 4. Consider outpatient further capsule endoscopy although may become transfusion dependent 5. Will need omeprazole [ was pantoprazole failure] twice daily for 3 months then indefinitely and probably repeat endoscopy in 3 months 6. Lasix held this admission resume in several days in the outpatient setting  Discharge Diagnoses:  Principal Problem:   Symptomatic anemia Active Problems:   Pulmonary fibrosis (HCC)   Depression, recurrent (HCC)   Paroxysmal atrial fibrillation (HCC)   Chronic systolic HF (heart failure) (HCC)   PNA (pneumonia)   Discharge Condition: Improved but high risk for readmission  Diet recommendation: Heart healthy  Filed Weights   10/30/20 1342 10/31/20 1021 10/31/20 2015  Weight: 54.9 kg 54.9 kg 56.1 kg    History of present illness:  79 year old white female nonobstructive cardiac disease EF 30-35% followed by Dr. Percival Spanish, A. fib CHADS2 score >5 not on anticoagulation secondary to GI bleed, tobacco abuse, moderate MR, BMI 19, celiac disease, pulmonary fibrosis, anxiety depression Multiple admissions for GI bleeding since 07/2019-colonoscopy 06/22/2019 diverticulosis hemorrhoids, EGD 10/08/2019, hypopharynx Candida gave without bleeding Capsule 10/08/2019 bulbar mucosal 2 small ulcers with clean base distal ileum possible gave  Readmit 10/30/2020 from PCP office hemoglobin 6.6-main complaint  is fatigue-no blood in stools new productive cough On admission transfused 2 units PRBC GI consulted and endoscopy performed 12/14  Hospital Course:  GI bleed Patient transfused total of 3 units this admission hemoglobin acceptable in the 9 range Patient declined repeat capsule endoscopy given the fact that she has had multiple evaluations and I will defer this to her gastroenterologist She is not having any overt bleeding so I suspect that this is either combination of hemorrhoidal bleed versus patient having small bowel bleeding that is just not accessible-she has had gave in the past in addition Continue pantoprazole twice daily and outpatient scope again in 3 months per GI Possible pneumonia Patient has a history of interstitial lung disease and has chronic crackles She was started on ceftriaxone azithromycin and was rapidly tapered to doxycycline to complete course at home Recommend chest x-ray in about 3 to 4 weeks Iron deficiency anemia Received Feraheme 1 dose 12/14 Outpatient coordination for follow-up dosing as above Chronic systolic heart failure Paroxysmal A. fib CHADS2 score >5 not on anticoagulation EF is chronically 35% Holding Lasix at this time and resume in several weeks in the outpatient setting Heart rate was a little elevated during admission however she was resumed on bisoprolol on discharge Anxiety depression Resume Xanax Celexa and other medications   Procedures:  EGD 12/14  severe esophagitis grade D extending from 30 to 36 cm from the incisors.  Was also found to have a hiatal hernia of 2 cm.  Below the GE junction she had 2 small polyps that looked inflammatory in nature.  The stomach had presence of food contents that were removed with Jabier Mutton net successfully.  No lesions were seen below the foot.  However, there was presence of mild  changes consistent with GAVE in her antrum, which were ablated with APC.  Duodenum had presence of diffuse scalloping, biopsies  were performed to assess celiac disease activity.  Consultations:  GI  Discharge Exam: Vitals:   11/01/20 0400 11/01/20 0812  BP: 131/65 116/68  Pulse: 75 (!) 115  Resp: 20 18  Temp: 98.2 F (36.8 C) 98.4 F (36.9 C)  SpO2: 94% 94%    General: Awake alert coherent no distress EOMI NCAT no focal deficit Cardiovascular: S1-S2 no murmur no rub no gallop RRR some tachycardia on monitors Respiratory: Clinically clear no added sound no rales no rhonchi Abdomen soft no rebound no guarding Neurologically intact moving all 4 limbs equally  Discharge Instructions   Discharge Instructions    Diet - low sodium heart healthy   Complete by: As directed    Discharge instructions   Complete by: As directed    This hospital admission you were diagnosed with anemia which is likely from the middle part of your stomach tract that we cannot evaluate clearly with the capsule endoscopy I think that it is a good idea to get follow-up labs in about 1 week at your primary physician's office it was also thought that you might have a pneumonia in your left lower lobe although you do have some interstitial lung disease it is not clear how much this plays a role-given the fact that were not completely sure that this is pneumonia I would continue antibiotics until the 17th and then discontinue them promptly-you will need an x-ray in about 3 to 4 weeks to make sure your lungs clear I would recommend he follow-up with Dr. Melony Overly in the outpatient setting   Increase activity slowly   Complete by: As directed      Allergies as of 11/01/2020      Reactions   Codeine Nausea And Vomiting   Tramadol Nausea And Vomiting   Asa [aspirin] Other (See Comments)   Patient is unaware of allergy   Azithromycin Other (See Comments)   Patient is unaware of allergy   Celebrex [celecoxib] Other (See Comments)   Irritated stomach   Cymbalta [duloxetine Hcl] Other (See Comments)   Felt groggy   Prednisone Rash   She has  seen the podiatrist and he had injected cortisone in her feet and she had also taken a peel at home. She had a severe reaction to her face with a rash and had to take Benadryl to resolve the symptom. She actually did get more cortisone shots, but no additional reactions to the shots.   Vioxx [rofecoxib] Other (See Comments)   Unknown   Zelnorm [tegaserod] Other (See Comments)   Patient is unaware of allergy   Zocor [simvastatin] Other (See Comments)   Patient is unaware of allergy      Medication List    STOP taking these medications   furosemide 20 MG tablet Commonly known as: LASIX   lidocaine 2 % solution Commonly known as: XYLOCAINE   metoCLOPramide 5 MG tablet Commonly known as: Reglan   multivitamin with minerals Tabs tablet   nystatin 100000 UNIT/ML suspension Commonly known as: MYCOSTATIN   pantoprazole 40 MG tablet Commonly known as: PROTONIX   potassium chloride SA 20 MEQ tablet Commonly known as: KLOR-CON   vitamin B-12 1000 MCG tablet Commonly known as: CYANOCOBALAMIN     TAKE these medications   acetaminophen 500 MG tablet Commonly known as: TYLENOL Take 500 mg by mouth every 8 (eight) hours as needed for  mild pain, moderate pain or headache.   albuterol 108 (90 Base) MCG/ACT inhaler Commonly known as: VENTOLIN HFA Inhale 2 puffs into the lungs every 6 (six) hours as needed for wheezing or shortness of breath (cough).   ALPRAZolam 0.5 MG tablet Commonly known as: XANAX Take 1 tablet (0.5 mg total) by mouth 2 (two) times daily.   bisoprolol 5 MG tablet Commonly known as: ZEBETA Take 1 tablet (5 mg total) by mouth daily. What changed: See the new instructions.   calcium carbonate 500 MG chewable tablet Commonly known as: TUMS - dosed in mg elemental calcium Chew 1 tablet by mouth daily.   cholecalciferol 1000 units tablet Commonly known as: VITAMIN D Take 2,000 Units by mouth daily.   doxycycline 100 MG tablet Commonly known as: ADOXA Take 1  tablet (100 mg total) by mouth 2 (two) times daily for 3 days.   escitalopram 20 MG tablet Commonly known as: LEXAPRO Take 1 tablet (20 mg total) by mouth at bedtime.   omeprazole 40 MG capsule Commonly known as: PRILOSEC Take 1 capsule (40 mg total) by mouth in the morning and at bedtime.   traZODone 150 MG tablet Commonly known as: DESYREL Use from 1/3 to 1 tablet nightly as needed for sleep.      Allergies  Allergen Reactions  . Codeine Nausea And Vomiting  . Tramadol Nausea And Vomiting  . Asa [Aspirin] Other (See Comments)    Patient is unaware of allergy  . Azithromycin Other (See Comments)    Patient is unaware of allergy  . Celebrex [Celecoxib] Other (See Comments)    Irritated stomach   . Cymbalta [Duloxetine Hcl] Other (See Comments)    Felt groggy  . Prednisone Rash    She has seen the podiatrist and he had injected cortisone in her feet and she had also taken a peel at home. She had a severe reaction to her face with a rash and had to take Benadryl to resolve the symptom. She actually did get more cortisone shots, but no additional reactions to the shots.  . Vioxx [Rofecoxib] Other (See Comments)    Unknown  . Zelnorm [Tegaserod] Other (See Comments)    Patient is unaware of allergy  . Zocor [Simvastatin] Other (See Comments)    Patient is unaware of allergy      The results of significant diagnostics from this hospitalization (including imaging, microbiology, ancillary and laboratory) are listed below for reference.    Significant Diagnostic Studies: DG Chest Portable 1 View  Result Date: 10/30/2020 CLINICAL DATA:  Weakness, anemia. EXAM: PORTABLE CHEST 1 VIEW COMPARISON:  July 10, 2019. FINDINGS: Stable cardiomegaly. No pneumothorax is noted. No pleural effusion is noted. Increased left basilar opacity is noted concerning for atelectasis or infiltrate. Stable probable scarring seen in right lung. Status post kyphoplasty at multiple levels of the lower  thoracic and lumbar spine. IMPRESSION: Increased left basilar opacity is noted concerning for atelectasis or infiltrate. Aortic Atherosclerosis (ICD10-I70.0). Electronically Signed   By: Marijo Conception M.D.   On: 10/30/2020 15:23    Microbiology: Recent Results (from the past 240 hour(s))  Resp Panel by RT-PCR (Flu A&B, Covid) Nasopharyngeal Swab     Status: None   Collection Time: 10/30/20  3:30 PM   Specimen: Nasopharyngeal Swab; Nasopharyngeal(NP) swabs in vial transport medium  Result Value Ref Range Status   SARS Coronavirus 2 by RT PCR NEGATIVE NEGATIVE Final    Comment: (NOTE) SARS-CoV-2 target nucleic acids are NOT DETECTED.  The SARS-CoV-2 RNA is generally detectable in upper respiratory specimens during the acute phase of infection. The lowest concentration of SARS-CoV-2 viral copies this assay can detect is 138 copies/mL. A negative result does not preclude SARS-Cov-2 infection and should not be used as the sole basis for treatment or other patient management decisions. A negative result may occur with  improper specimen collection/handling, submission of specimen other than nasopharyngeal swab, presence of viral mutation(s) within the areas targeted by this assay, and inadequate number of viral copies(<138 copies/mL). A negative result must be combined with clinical observations, patient history, and epidemiological information. The expected result is Negative.  Fact Sheet for Patients:  EntrepreneurPulse.com.au  Fact Sheet for Healthcare Providers:  IncredibleEmployment.be  This test is no t yet approved or cleared by the [place] FDA and  has been authorized for detection and/or diagnosis of SARS-CoV-2 by FDA under an Emergency Use Authorization (EUA). This EUA will remain  in effect (meaning this test can be used) for the duration of the COVID-19 declaration under Section 564(b)(1) of the Act, 21 U.S.C.section  360bbb-3(b)(1), unless the authorization is terminated  or revoked sooner.       Influenza A by PCR NEGATIVE NEGATIVE Final   Influenza B by PCR NEGATIVE NEGATIVE Final    Comment: (NOTE) The Xpert Xpress SARS-CoV-2/FLU/RSV plus assay is intended as an aid in the diagnosis of influenza from Nasopharyngeal swab specimens and should not be used as a sole basis for treatment. Nasal washings and aspirates are unacceptable for Xpert Xpress SARS-CoV-2/FLU/RSV testing.  Fact Sheet for Patients: EntrepreneurPulse.com.au  Fact Sheet for Healthcare Providers: IncredibleEmployment.be  This test is not yet approved or cleared by the [place] FDA and has been authorized for detection and/or diagnosis of SARS-CoV-2 by FDA under an Emergency Use Authorization (EUA). This EUA will remain in effect (meaning this test can be used) for the duration of the COVID-19 declaration under Section 564(b)(1) of the Act, 21 U.S.C. section 360bbb-3(b)(1), unless the authorization is terminated or revoked.  Performed at [facility], [address] [place], [place] [address]      Labs: Basic Metabolic Panel: Recent Labs  Lab 10/26/20 1056 10/30/20 1523  NA 142 139  K 4.4 4.2  CL 100 102  CO2 23 30  GLUCOSE 110* 106*  BUN 14 13  CREATININE 0.92 0.86  CALCIUM 9.0 8.4*   Liver Function Tests: Recent Labs  Lab 10/26/20 1056  AST 8  ALT 6  ALKPHOS 128*  BILITOT <0.2  PROT 6.6  ALBUMIN 3.6*   No results for input(s): LIPASE, AMYLASE in the last 168 hours. No results for input(s): AMMONIA in the last 168 hours. CBC: Recent Labs  Lab 10/26/20 1056 10/30/20 1523 10/31/20 0115 10/31/20 0757  WBC 8.6 8.0  --  7.7  NEUTROABS 6.9 6.0  --   --   HGB 6.6* 6.2* 9.0* 9.2*  HCT 23.6* 24.0* 31.0* 32.0*  MCV 76* 81.9  --  82.9  PLT 345 335  --  351   Cardiac Enzymes: No results for input(s): CKTOTAL, CKMB, CKMBINDEX, TROPONINI in the last 168  hours. BNP: BNP (last 3 results) No results for input(s): BNP in the last 8760 hours.  ProBNP (last 3 results) No results for input(s): PROBNP in the last 8760 hours.  CBG: No results for input(s): GLUCAP in the last 168 hours.     Signed:  Nita Sells MD   [facility] 11/01/2020, 10:03 AM

## 2020-11-02 ENCOUNTER — Other Ambulatory Visit (INDEPENDENT_AMBULATORY_CARE_PROVIDER_SITE_OTHER): Payer: Self-pay | Admitting: *Deleted

## 2020-11-02 DIAGNOSIS — D649 Anemia, unspecified: Secondary | ICD-10-CM

## 2020-11-02 NOTE — Telephone Encounter (Signed)
3 mth EGD noted in recall

## 2020-11-02 NOTE — Telephone Encounter (Signed)
Ann to put Egd on recall list

## 2020-11-02 NOTE — Telephone Encounter (Signed)
Review note below from Neil Crouch , [place]. Patient will nee appointment in 2-3 weeks with Dr.Rehman/Castaneda. Follow up for IDA, Celiac , GAVE and severe reflux esophagitis. Prescription was completed by physician at hospital.  Repeat EGD in 3 months. CBC in one week.

## 2020-11-03 NOTE — Telephone Encounter (Signed)
Patient also needs office visit in 4 to 6 weeks

## 2020-11-06 ENCOUNTER — Encounter (HOSPITAL_COMMUNITY): Payer: Self-pay | Admitting: Gastroenterology

## 2020-11-07 NOTE — Telephone Encounter (Signed)
I am seeing her in the office on 11/16/2020

## 2020-11-16 ENCOUNTER — Ambulatory Visit (INDEPENDENT_AMBULATORY_CARE_PROVIDER_SITE_OTHER): Payer: Medicare Other | Admitting: Internal Medicine

## 2020-11-16 ENCOUNTER — Encounter (INDEPENDENT_AMBULATORY_CARE_PROVIDER_SITE_OTHER): Payer: Self-pay | Admitting: Internal Medicine

## 2020-11-16 ENCOUNTER — Other Ambulatory Visit: Payer: Self-pay

## 2020-11-16 VITALS — BP 164/84 | HR 73 | Temp 98.9°F | Ht 67.0 in | Wt 126.5 lb

## 2020-11-16 DIAGNOSIS — R600 Localized edema: Secondary | ICD-10-CM

## 2020-11-16 DIAGNOSIS — D649 Anemia, unspecified: Secondary | ICD-10-CM

## 2020-11-16 DIAGNOSIS — K2100 Gastro-esophageal reflux disease with esophagitis, without bleeding: Secondary | ICD-10-CM | POA: Diagnosis not present

## 2020-11-16 DIAGNOSIS — K900 Celiac disease: Secondary | ICD-10-CM

## 2020-11-16 MED ORDER — NEPHRO-VITE RX 1 MG PO TABS: 1.0000 | ORAL_TABLET | Freq: Every day | ORAL | Status: DC

## 2020-11-16 MED ORDER — CALCIUM CARBONATE ANTACID 500 MG PO CHEW: 2.0000 | CHEWABLE_TABLET | Freq: Every day | ORAL | Status: AC

## 2020-11-16 MED ORDER — FUROSEMIDE 20 MG PO TABS: 20.0000 mg | ORAL_TABLET | ORAL | 0 refills | Status: DC | PRN

## 2020-11-16 MED ORDER — POTASSIUM CHLORIDE CRYS ER 20 MEQ PO TBCR
20.0000 meq | EXTENDED_RELEASE_TABLET | ORAL | 0 refills | Status: DC | PRN
Start: 2020-11-16 — End: 2021-03-28

## 2020-11-16 NOTE — Patient Instructions (Addendum)
Use Lasix/furosemide for leg edema on as-needed basis but no more than twice a week.  Remember to take KCl every time you take Lasix. Physician will call with results of blood work when completed

## 2020-11-16 NOTE — Progress Notes (Signed)
Presenting complaint;  Follow-up for reflux esophagitis celiac disease and anemia.  Database and subjective:  Patient is 79 year old Caucasian female with multiple medical problems who is here for scheduled visit. She has a history of erosive reflux esophagitis gastroparesis and celiac disease which was diagnosed in November 2018 but unfortunately she has not been compliant with gluten-free diet. She saw Dr. Claretta Fraise on on 10/26/2020 and had blood work.  Her hemoglobin was 6.6.  It was 9.7 on 07/20/2020.  Patient was therefore contacted on 10/30/2020 and advised to report to emergency room for evaluation. Patient received 2 units of PRBCs.  Chest film suggested pneumonia.  She received antibiotics.  She underwent esophagogastroduodenoscopy by Dr. Jenetta Downer and found to have erosive esophagitis involving distal 6 cm of esophageal mucosa abnormal duodenal mucosa with typical changes of celiac disease confirmed with biopsy.  She also had gastric antral vascular ectasia which were ablated with APC.  Patient is accompanied by her daughter-in-law Neoma Laming.  She says she is feeling better.  Neoma Laming states that patient has been much more compliant with gluten-free diet but not 100%.  She feels heartburn is well controlled with omeprazole.  She denies dysphagia nausea vomiting dysphagia or abdominal pain.  She has noted leg edema last week.  She has a history of cardiomyopathy and CHF and is concerned that she may get into trouble without fluid pill which was discontinued during hospitalization.  Patient says she is watching salt intake.  She denies shortness of breath.  She is having 2 bowel movements per day.  She denies melena or rectal bleeding.  She has not lost any weight recently.   Current Medications: Outpatient Encounter Medications as of 11/16/2020  Medication Sig   acetaminophen (TYLENOL) 500 MG tablet Take 500 mg by mouth every 8 (eight) hours as needed for mild pain, moderate pain or  headache.    albuterol (VENTOLIN HFA) 108 (90 Base) MCG/ACT inhaler Inhale 2 puffs into the lungs every 6 (six) hours as needed for wheezing or shortness of breath (cough).   ALPRAZolam (XANAX) 0.5 MG tablet Take 1 tablet (0.5 mg total) by mouth 2 (two) times daily.   bisoprolol (ZEBETA) 5 MG tablet Take 1 tablet (5 mg total) by mouth daily. (Patient taking differently: Take 5 mg by mouth daily.)   calcium carbonate (TUMS - DOSED IN MG ELEMENTAL CALCIUM) 500 MG chewable tablet Chew 1 tablet by mouth daily.   cholecalciferol (VITAMIN D) 1000 UNITS tablet Take 2,000 Units by mouth daily.    escitalopram (LEXAPRO) 20 MG tablet Take 1 tablet (20 mg total) by mouth at bedtime.   omeprazole (PRILOSEC) 40 MG capsule Take 1 capsule (40 mg total) by mouth in the morning and at bedtime.   traZODone (DESYREL) 150 MG tablet Use from 1/3 to 1 tablet nightly as needed for sleep.   No facility-administered encounter medications on file as of 11/16/2020.     Objective: Blood pressure (!) 164/84, pulse 73, temperature 98.9 F (37.2 C), temperature source Oral, height _0  (1.702 m), weight 126 lb 8 oz (57.4 kg). Patient is alert and in no acute distress. She is is pale secondary to a very fair complexion. Conjunctiva is pink. Sclera is nonicteric Oropharyngeal mucosa is normal. No neck masses or thyromegaly noted. JVD is not elevated. Cardiac exam with regular rhythm normal S1 and S2.  She has faint systolic murmur at aortic area. She has few crackles at left base.. Abdomen is full but soft and nontender with organomegaly or  masses. She has trace pitting edema around ankles.  Labs/studies Results:  CBC Latest Ref Rng & Units 10/31/2020 10/31/2020 10/30/2020  WBC 4.0 - 10.5 K/uL 7.7 - 8.0  Hemoglobin 12.0 - 15.0 g/dL 9.2(L) 9.0(L) 6.2(LL)  Hematocrit 36.0 - 46.0 % 32.0(L) 31.0(L) 24.0(L)  Platelets 150 - 400 K/uL 351 - 335    CMP Latest Ref Rng & Units 10/30/2020 10/26/2020 07/20/2020   Glucose 70 - 99 mg/dL 106(H) 110(H) 116(H)  BUN 8 - 23 mg/dL _0 Creatinine 0.44 - 1.00 mg/dL 0.86 0.92 1.13(H)  Sodium 135 - 145 mmol/L 139 142 138  Potassium 3.5 - 5.1 mmol/L 4.2 4.4 4.6  Chloride 98 - 111 mmol/L 102 100 97  CO2 22 - 32 mmol/L _1 Calcium 8.9 - 10.3 mg/dL 8.4(L) 9.0 9.3  Total Protein 6.0 - 8.5 g/dL - 6.6 7.3  Total Bilirubin 0.0 - 1.2 mg/dL - <0.2 <0.2  Alkaline Phos 44 - 121 IU/L - 128(H) 147(H)  AST 0 - 40 IU/L - 8 14  ALT 0 - 32 IU/L - 6 9    Hepatic Function Latest Ref Rng & Units 10/26/2020 07/20/2020 04/20/2020  Total Protein 6.0 - 8.5 g/dL 6.6 7.3 6.8  Albumin 3.7 - 4.7 g/dL 3.6(L) 4.2 4.1  AST 0 - 40 IU/L _2 ALT 0 - 32 IU/L _3 Alk Phosphatase 44 - 121 IU/L 128(H) 147(H) 146(H)  Total Bilirubin 0.0 - 1.2 mg/dL <0.2 <0.2 0.2  Bilirubin, Direct 0.0 - 0.2 mg/dL - - -      Assessment:  #1.  Anemia.  She was noted to have heme positive stool during recent hospitalization.  Nevertheless anemia felt to be multifactorial.  She must also have impaired iron absorption due to active celiac disease.  Gastric antral vascular ectasia may be another source of her anemia and GI bleed although active bleeding not documented on recent EGD.  These lesions however were ablated with APC.  #2.  Celiac disease.  Now that she is trying to be compliant with gluten-free diet I suspect patient be symptomatic improvement and in May when help improve her H&H.  #3.  Erosive reflux esophagitis.  She is getting better results with omeprazole than she did with pantoprazole.  #4.  Bilateral leg edema.  She has a history of cardiomyopathy and CHF.  While she does not need to be on furosemide daily but she can use it twice a week if she needs to.  She needs to make sure that she take potassium 20 mEq on days she takes furosemide.   Plan:  Patient will go to lab for CBC. Furosemide 20 mg twice a week as needed. KCl 20 mEq to be taken on days when she takes  Lasix. Increase Tums to 2 tablets daily in order to provide calcium supplement. B complex with folic acid 1 tablet daily. Office visit in 2 months.

## 2020-11-17 LAB — CBC
HCT: 35.6 % (ref 35.0–45.0)
Hemoglobin: 11 g/dL — ABNORMAL LOW (ref 11.7–15.5)
MCH: 26 pg — ABNORMAL LOW (ref 27.0–33.0)
MCHC: 30.9 g/dL — ABNORMAL LOW (ref 32.0–36.0)
MCV: 84.2 fL (ref 80.0–100.0)
MPV: 11.9 fL (ref 7.5–12.5)
Platelets: 274 10*3/uL (ref 140–400)
RBC: 4.23 10*6/uL (ref 3.80–5.10)
RDW: 22.5 % — ABNORMAL HIGH (ref 11.0–15.0)
WBC: 6.2 10*3/uL (ref 3.8–10.8)

## 2020-11-30 ENCOUNTER — Ambulatory Visit (INDEPENDENT_AMBULATORY_CARE_PROVIDER_SITE_OTHER): Payer: Medicare Other | Admitting: Internal Medicine

## 2020-12-11 IMAGING — DX CHEST - 2 VIEW
2 series · 2 of 2 positions shown · non-contrast
Comparison: 03/28/2019

CLINICAL DATA: Anemia

EXAM:
CHEST - 2 VIEW

[chest ap]
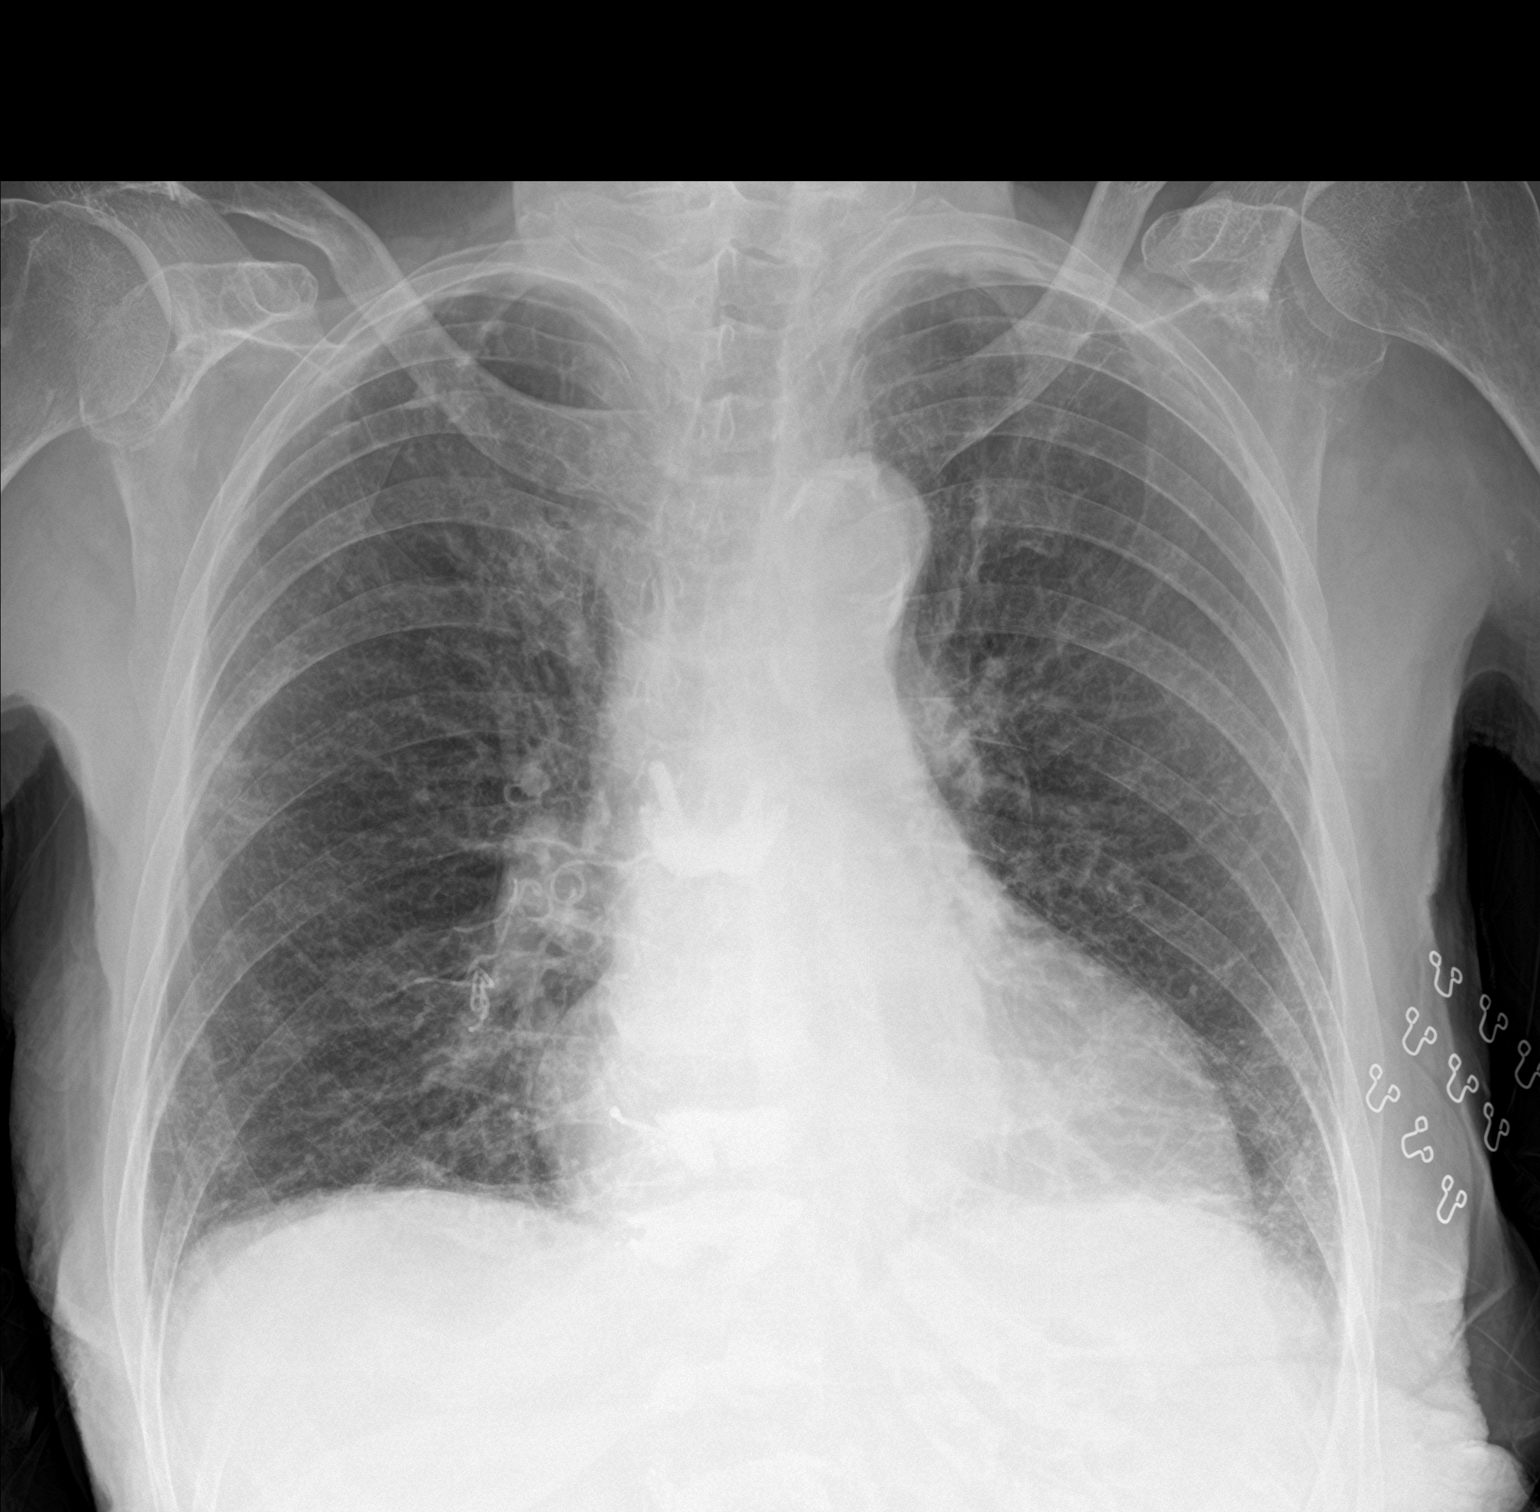

[chest lat]
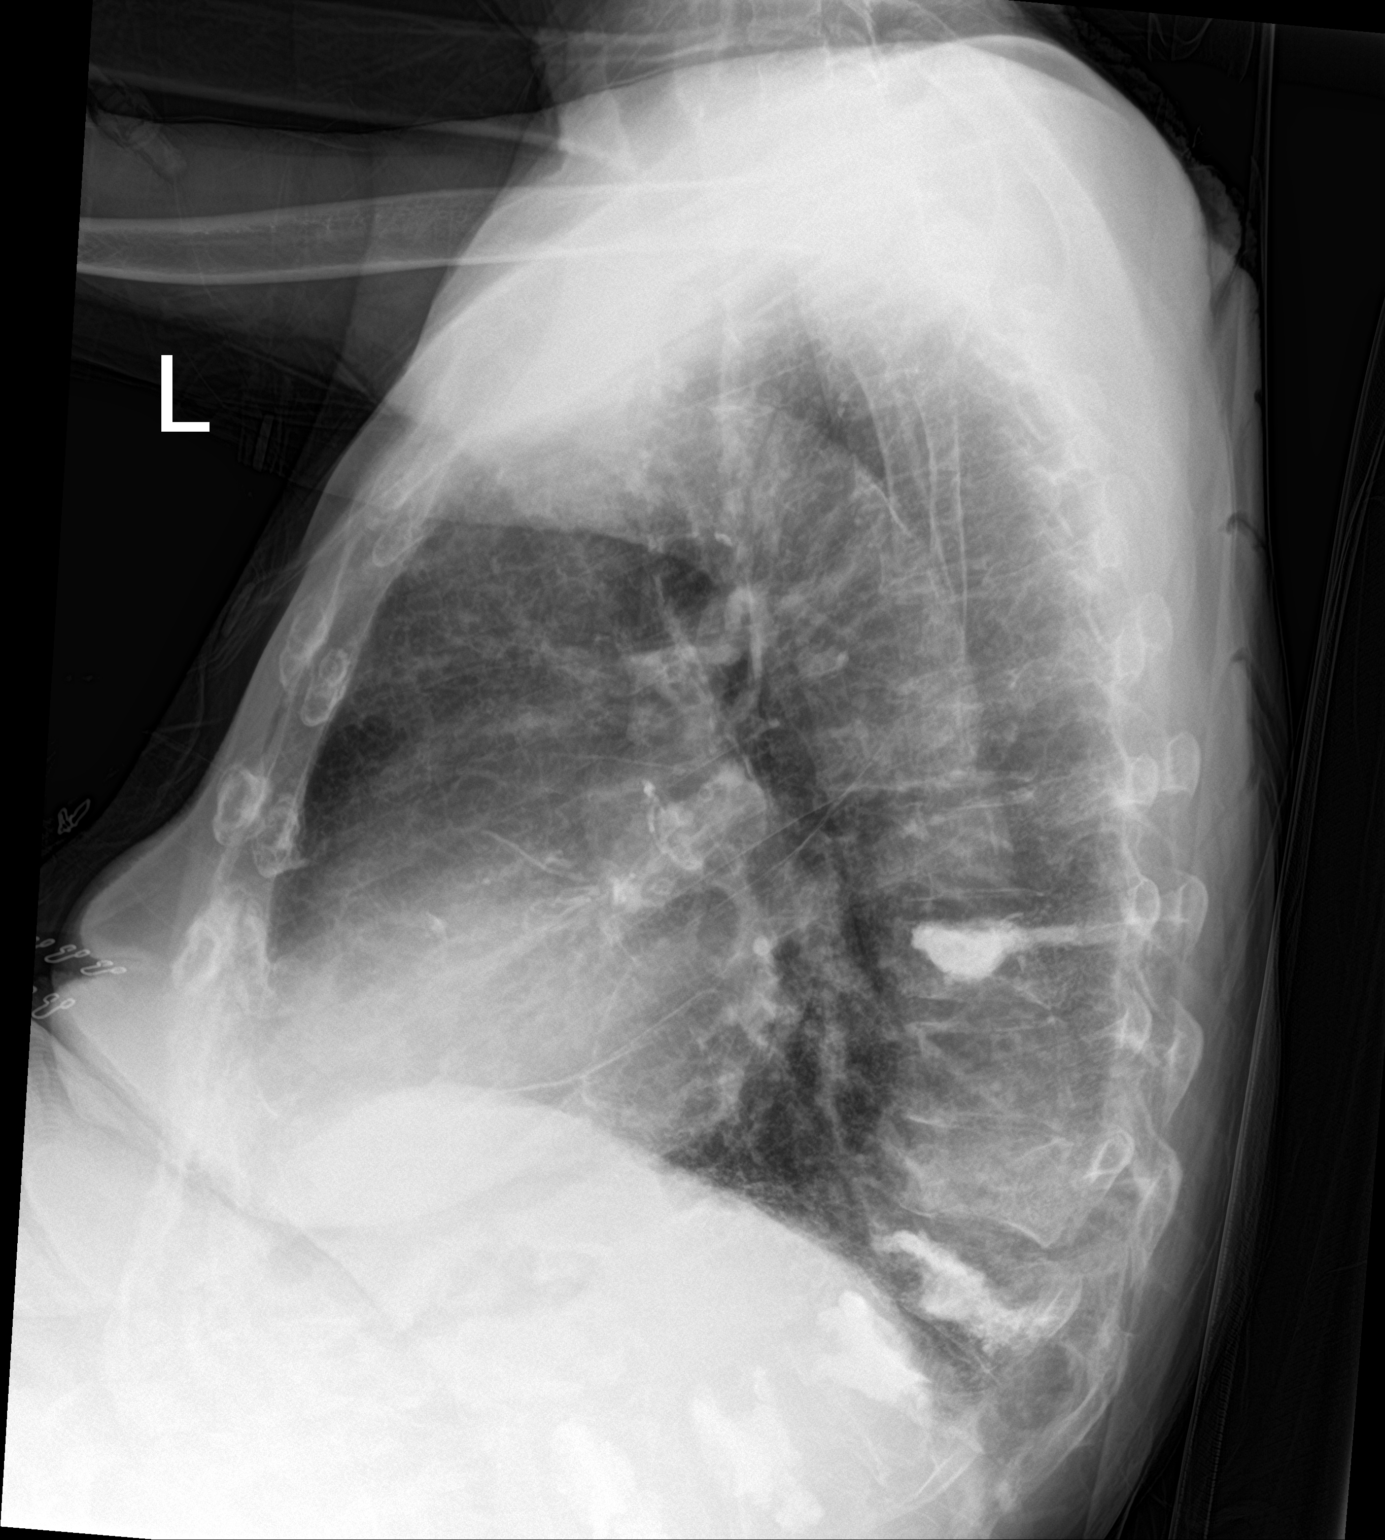

[2 of 2 positions shown; findings below may reference images not displayed]

FINDINGS: Cardiomegaly. No edema, effusions. Bibasilar atelectasis. Prior
multilevel vertebroplasty. No acute bony abnormality.
IMPRESSION: Cardiomegaly.  Bibasilar atelectasis.

## 2021-01-04 DIAGNOSIS — B351 Tinea unguium: Secondary | ICD-10-CM | POA: Diagnosis not present

## 2021-01-04 DIAGNOSIS — M79676 Pain in unspecified toe(s): Secondary | ICD-10-CM | POA: Diagnosis not present

## 2021-01-11 ENCOUNTER — Encounter (INDEPENDENT_AMBULATORY_CARE_PROVIDER_SITE_OTHER): Payer: Self-pay | Admitting: *Deleted

## 2021-01-11 ENCOUNTER — Other Ambulatory Visit: Payer: Self-pay | Admitting: Family Medicine

## 2021-01-13 IMAGING — NM NM GI BLOOD LOSS
2 series · 12 of 12 positions shown · non-contrast
Comparison: None

CLINICAL DATA: Chronic anemia question GI blood loss

EXAM:
NUCLEAR MEDICINE GASTROINTESTINAL BLEEDING SCAN
TECHNIQUE: Sequential abdominal images were obtained following intravenous
administration of 6c-QQm labeled red blood cells.
RADIOPHARMACEUTICALS:  125.1 mCi 6c-QQm pertechnetate in-vitro
labeled red cells.

[Series 1: gi bleed · 4.14mm/px · 6 of 60 frames shown (1 of 2)]
[frame 6/60]
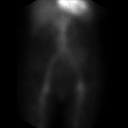
[frame 16/60]
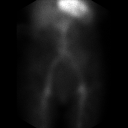
[frame 26/60]
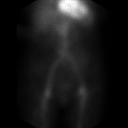
[frame 36/60]
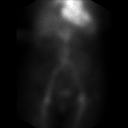
[frame 46/60]
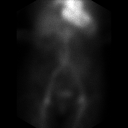
[frame 56/60]
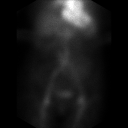

[Series 2: gi bleed · 4.14mm/px · 6 of 60 frames shown (2 of 2)]
[frame 6/60]
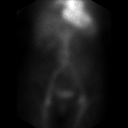
[frame 16/60]
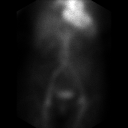
[frame 26/60]
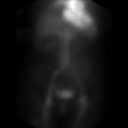
[frame 36/60]
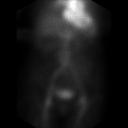
[frame 46/60]
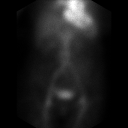
[frame 56/60]
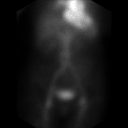

[12 of 12 positions shown; findings below may reference images not displayed]

FINDINGS: Intermittent artifacts from the patient's RIGHT arm.

Normal blood pool distribution of labeled red cells.

No abnormal gastrointestinal localization of tracer identified
through 2 hours of imaging to suggest active GI bleeding.

Small amount of de labeled tracer within urinary bladder.
IMPRESSION: Negative GI bleeding scan.

## 2021-01-25 ENCOUNTER — Encounter: Payer: Self-pay | Admitting: Family Medicine

## 2021-01-25 ENCOUNTER — Ambulatory Visit (INDEPENDENT_AMBULATORY_CARE_PROVIDER_SITE_OTHER): Payer: Medicare Other | Admitting: Family Medicine

## 2021-01-25 ENCOUNTER — Other Ambulatory Visit: Payer: Self-pay

## 2021-01-25 VITALS — BP 133/63 | HR 77 | Temp 97.5°F | Ht 67.0 in | Wt 123.8 lb

## 2021-01-25 DIAGNOSIS — F411 Generalized anxiety disorder: Secondary | ICD-10-CM | POA: Diagnosis not present

## 2021-01-25 DIAGNOSIS — I700 Atherosclerosis of aorta: Secondary | ICD-10-CM

## 2021-01-25 DIAGNOSIS — E782 Mixed hyperlipidemia: Secondary | ICD-10-CM | POA: Diagnosis not present

## 2021-01-25 DIAGNOSIS — I480 Paroxysmal atrial fibrillation: Secondary | ICD-10-CM | POA: Diagnosis not present

## 2021-01-25 DIAGNOSIS — D62 Acute posthemorrhagic anemia: Secondary | ICD-10-CM

## 2021-01-25 LAB — CMP14+EGFR
ALT: 9 IU/L (ref 0–32)
AST: 16 IU/L (ref 0–40)
Albumin/Globulin Ratio: 1.4 (ref 1.2–2.2)
Albumin: 3.8 g/dL (ref 3.7–4.7)
Alkaline Phosphatase: 123 IU/L — ABNORMAL HIGH (ref 44–121)
BUN/Creatinine Ratio: 12 (ref 12–28)
BUN: 10 mg/dL (ref 8–27)
Bilirubin Total: <0.2 mg/dL (ref 0.0–1.2)
CO2: 22 mmol/L (ref 20–29)
Calcium: 8.9 mg/dL (ref 8.7–10.3)
Chloride: 104 mmol/L (ref 96–106)
Creatinine, Ser: 0.82 mg/dL (ref 0.57–1.00)
Globulin, Total: 2.8 g/dL (ref 1.5–4.5)
Glucose: 105 mg/dL — ABNORMAL HIGH (ref 65–99)
Potassium: 4.6 mmol/L (ref 3.5–5.2)
Sodium: 141 mmol/L (ref 134–144)
Total Protein: 6.6 g/dL (ref 6.0–8.5)
eGFR: 73 mL/min/{1.73_m2} (ref >59)

## 2021-01-25 LAB — CBC WITH DIFFERENTIAL/PLATELET
Basophils Absolute: 0.1 10*3/uL (ref 0.0–0.2)
Basos: 1 %
EOS (ABSOLUTE): 1 10*3/uL — ABNORMAL HIGH (ref 0.0–0.4)
Eos: 14 %
Hematocrit: 30.1 % — ABNORMAL LOW (ref 34.0–46.6)
Hemoglobin: 9.3 g/dL — ABNORMAL LOW (ref 11.1–15.9)
Immature Grans (Abs): 0 10*3/uL (ref 0.0–0.1)
Immature Granulocytes: 0 %
Lymphocytes Absolute: 1 10*3/uL (ref 0.7–3.1)
Lymphs: 14 %
MCH: 27.6 pg (ref 26.6–33.0)
MCHC: 30.9 g/dL — ABNORMAL LOW (ref 31.5–35.7)
MCV: 89 fL (ref 79–97)
Monocytes Absolute: 0.6 10*3/uL (ref 0.1–0.9)
Monocytes: 8 %
Neutrophils Absolute: 4.6 10*3/uL (ref 1.4–7.0)
Neutrophils: 63 %
Platelets: 299 10*3/uL (ref 150–450)
RBC: 3.37 x10E6/uL — ABNORMAL LOW (ref 3.77–5.28)
RDW: 15.1 % (ref 11.7–15.4)
WBC: 7.2 10*3/uL (ref 3.4–10.8)

## 2021-01-25 MED ORDER — ALPRAZOLAM 0.5 MG PO TABS: 0.5000 mg | ORAL_TABLET | Freq: Two times a day (BID) | ORAL | 2 refills | Status: DC

## 2021-01-25 MED ORDER — TRAZODONE HCL 150 MG PO TABS: ORAL_TABLET | ORAL | 5 refills | Status: DC

## 2021-01-25 NOTE — Progress Notes (Signed)
Subjective:  Patient ID: Misty [name], female    DOB: 05-08-1941  Age: 80 y.o. MRN: [id number]  CC: Medical Management of Chronic Issues   HPI Misty [name] presents for  Anxiety. Husband's deafness flares it. Also his memory is bad and affects her anxiety.    Depression screen [facility] 2/9 01/25/2021 01/25/2021 10/26/2020  Decreased Interest 0 0 0  Down, Depressed, Hopeless 0 0 0  PHQ - 2 Score 0 0 0  Some recent data might be hidden    History [name] has a past medical history of Anxiety, Arthritis, Atrial fibrillation with RVR ([place]) (03/25/2019), Cataract, Chronic back pain, Colitis, Constipation, COPD (chronic obstructive pulmonary disease) ([place]), Depression, Diverticulosis, Educated about COVID-19 virus infection (04/07/2019), Elevated troponin (03/25/2019), GERD (gastroesophageal reflux disease), H/O kyphoplasty (01/14/2019), Hematochezia (02/26/2015), Hemorrhoids, History of bronchitis, History of migraine, History of shingles, Hyperlipidemia, Hypokalemia (01/10/2019), Insomnia, Migraines, Nontraumatic compression fracture of T11 vertebra ([place]) (07/08/2014), Osteoporosis, and PONV (postoperative nausea and vomiting).   She has a past surgical history that includes Abdominal hysterectomy; Esophagogastroduodenoscopy; Colonoscopy; Lumbar laminectomy/decompression microdiscectomy (Bilateral, 05/20/2013); Eye surgery (Right, 3/15); Kyphoplasty (N/A, 07/08/2014); Colonoscopy (N/A, 02/17/2015); Esophagogastroduodenoscopy (N/A, 01/29/2016); Esophagogastroduodenoscopy (N/A, 10/14/2017); Spine surgery; Intramedullary (im) nail intertrochanteric (Left, 01/07/2019); RIGHT/LEFT HEART CATH AND CORONARY ANGIOGRAPHY (N/A, 03/31/2019); left hip surgery; Colonoscopy (N/A, 07/12/2019); Esophagogastroduodenoscopy (N/A, 07/12/2019); biopsy (07/12/2019); Givens capsule study (N/A, 07/29/2019); Givens capsule study (N/A, 08/20/2019); Esophagogastroduodenoscopy (N/A, 10/03/2019); Givens capsule study (10/03/2019);  Esophagogastroduodenoscopy (egd) with propofol (N/A, 10/31/2020); biopsy (10/31/2020); and Hot hemostasis (10/31/2020).   Her family history includes Cirrhosis in her son; Heart attack in her mother; Heart disease in her father and son; Hypertension in her mother; Macular degeneration in her father.She reports that she has been smoking cigarettes. She started smoking about 62 years ago. She has a 52.00 pack-year smoking history. She has never used smokeless tobacco. She reports that she does not drink alcohol and does not use drugs.    ROS Review of Systems  Constitutional: Negative.   HENT: Negative.   Eyes: Negative for visual disturbance.  Respiratory: Negative for shortness of breath.   Cardiovascular: Negative for chest pain.  Gastrointestinal: Negative for abdominal pain.  Musculoskeletal: Negative for arthralgias.    Objective:  BP 133/63   Pulse 77   Temp (!) 97.5 F (36.4 C)   Ht 5' 7"  (1.702 m)   Wt 123 lb 12.8 oz (56.2 kg)   SpO2 96%   BMI 19.39 kg/m   BP Readings from Last 3 Encounters:  01/25/21 133/63  11/16/20 (!) 164/84  11/01/20 122/69    Wt Readings from Last 3 Encounters:  01/25/21 123 lb 12.8 oz (56.2 kg)  11/16/20 126 lb 8 oz (57.4 kg)  10/31/20 123 lb 10.9 oz (56.1 kg)     Physical Exam Constitutional:      General: She is not in acute distress.    Appearance: She is well-developed.  Cardiovascular:     Rate and Rhythm: Normal rate and regular rhythm.  Pulmonary:     Breath sounds: Normal breath sounds.  Skin:    General: Skin is warm and dry.  Neurological:     Mental Status: She is alert and oriented to person, place, and time.       Assessment & Plan:   Misty [name] was seen today for medical management of chronic issues.  Diagnoses and all orders for this visit:  Aortic atherosclerosis ([place]) -     CMP14+EGFR  GAD (generalized anxiety disorder) -  CMP14+EGFR  Mixed hyperlipidemia -     CMP14+EGFR  Paroxysmal atrial  fibrillation (H[facility]) -     CMP14+EGFR  Acute blood loss anemia -     CBC with Differential/Platelet -     CMP14+EGFR  Other orders -     ALPRAZolam (XANAX) 0.5 MG tablet; Take 1 tablet (0.5 mg total) by mouth 2 (two) times daily. -     traZODone (DESYREL) 150 MG tablet; Use from 1/3 to 1 tablet nightly as needed for sleep.       I am having Misty [name] "Misty [name]" maintain her cholecalciferol, acetaminophen, albuterol, escitalopram, bisoprolol, omeprazole, furosemide, potassium chloride SA, B complex-vitamin C-folic acid, calcium carbonate, ALPRAZolam, and traZODone.  Allergies as of 01/25/2021      Reactions   Codeine Nausea And Vomiting   Tramadol Nausea And Vomiting   Asa [aspirin] Other (See Comments)   Patient is unaware of allergy   Azithromycin Other (See Comments)   Patient is unaware of allergy   Celebrex [celecoxib] Other (See Comments)   Irritated stomach   Cymbalta [duloxetine Hcl] Other (See Comments)   Felt groggy   Prednisone Rash   She has seen the podiatrist and he had injected cortisone in her feet and she had also taken a peel at home. She had a severe reaction to her face with a rash and had to take Benadryl to resolve the symptom. She actually did get more cortisone shots, but no additional reactions to the shots.   Vioxx [rofecoxib] Other (See Comments)   Unknown   Zelnorm [tegaserod] Other (See Comments)   Patient is unaware of allergy   Zocor [simvastatin] Other (See Comments)   Patient is unaware of allergy      Medication List       Accurate as of January 25, 2021 11:59 PM. If you have any questions, ask your nurse or doctor.        acetaminophen 500 MG tablet Commonly known as: TYLENOL Take 500 mg by mouth every 8 (eight) hours as needed for mild pain, moderate pain or headache.   albuterol 108 (90 Base) MCG/ACT inhaler Commonly known as: VENTOLIN HFA Inhale 2 puffs into the lungs every 6 (six) hours as needed for wheezing or shortness of  breath (cough).   ALPRAZolam 0.5 MG tablet Commonly known as: XANAX Take 1 tablet (0.5 mg total) by mouth 2 (two) times daily.   B complex-vitamin C-folic acid 1 MG tablet Take 1 tablet by mouth daily with breakfast.   bisoprolol 5 MG tablet Commonly known as: ZEBETA Take 1 tablet (5 mg total) by mouth daily.   calcium carbonate 500 MG chewable tablet Commonly known as: TUMS - dosed in mg elemental calcium Chew 2 tablets (400 mg of elemental calcium total) by mouth daily.   cholecalciferol 1000 units tablet Commonly known as: VITAMIN D Take 2,000 Units by mouth daily.   escitalopram 20 MG tablet Commonly known as: LEXAPRO Take 1 tablet (20 mg total) by mouth at bedtime.   furosemide 20 MG tablet Commonly known as: Lasix Take 1 tablet (20 mg total) by mouth every 3 (three) days as needed.   omeprazole 40 MG capsule Commonly known as: PRILOSEC Take 1 capsule (40 mg total) by mouth in the morning and at bedtime.   potassium chloride SA 20 MEQ tablet Commonly known as: KLOR-CON Take 1 tablet (20 mEq total) by mouth every 3 (three) days as needed.   traZODone 150 MG tablet Commonly known as: DESYREL  Use from 1/3 to 1 tablet nightly as needed for sleep.        Follow-up: No follow-ups on file.  Claretta Fraise, M.D.

## 2021-01-26 ENCOUNTER — Encounter: Payer: Self-pay | Admitting: Family Medicine

## 2021-01-29 ENCOUNTER — Telehealth: Payer: Self-pay

## 2021-01-29 NOTE — Telephone Encounter (Signed)
Mailed per request

## 2021-03-13 ENCOUNTER — Ambulatory Visit (INDEPENDENT_AMBULATORY_CARE_PROVIDER_SITE_OTHER): Payer: Medicare Other | Admitting: Internal Medicine

## 2021-03-27 NOTE — Progress Notes (Signed)
Cardiology Office Note   Date:  03/28/2021   ID:  Misty [name], Misty [name] 10-Mar-1941, MRN [id number]  PCP:  Claretta Fraise, MD  Cardiologist:   Minus Breeding, MD Referring:  Claretta Fraise, MD  Chief Complaint  Patient presents with  . Weakness      History of Present Illness: Misty [name] is a 80 y.o. female who presents for follow-up after recent hospitalizations with GI bleed.  She has a history of cardiomyopathy.  She has mild nonobstructive coronary disease.  This was when she was in the hospital with respiratory failure.  She had pleural effusions. We have been managing her reduced ejection fraction medically.  She was in the hospital in August 2021 with anemia.   She has had an extensive work up and re challenges with DOAC as previously outlined and ultimately was taken off.  Her last hospitalization for anemia was Dec 2021.    He has had no new problems since I last saw her.  She gets around with a slowly with a walker.  She gets fatigued when her hemoglobin goes down.  She has not had any active bleeding.  She denies any chest discomfort, neck or arm discomfort.  She has no palpitations, presyncope or syncope.    Past Medical History:  Diagnosis Date  . Anxiety    takes Xanax daily  . Arthritis    back  . Atrial fibrillation with RVR ([place]) 03/25/2019  . Cataract   . Chronic back pain   . Colitis   . Constipation    OTC stool softener prn  . COPD (chronic obstructive pulmonary disease) ([place])   . Depression   . Diverticulosis   . Educated about COVID-19 virus infection 04/07/2019  . Elevated troponin 03/25/2019  . GERD (gastroesophageal reflux disease)    takes Ranidine daily  . H/O kyphoplasty 01/14/2019  . Hematochezia 02/26/2015  . Hemorrhoids   . History of bronchitis   . History of migraine    many yrs ago  . History of shingles   . Hyperlipidemia   . Hypokalemia 01/10/2019  . Insomnia   . Migraines   . Nontraumatic compression fracture of T11 vertebra  (HCC) 07/08/2014  . Osteoporosis   . PONV (postoperative nausea and vomiting)     Past Surgical History:  Procedure Laterality Date  . ABDOMINAL HYSTERECTOMY    . BIOPSY  07/12/2019   Procedure: BIOPSY;  Surgeon: Rogene Houston, MD;  [facility];  Service: Endoscopy;;  duodenum  . BIOPSY  10/31/2020   Procedure: BIOPSY;  Surgeon: Harvel Quale, MD;  [facility];  Service: Gastroenterology;;  . COLONOSCOPY    . COLONOSCOPY N/A 02/17/2015   Procedure: COLONOSCOPY;  Surgeon: Rogene Houston, MD;  [facility];  Service: Endoscopy;  Laterality: N/A;  110n - moved to 11:15 - Ann to notify pt  . COLONOSCOPY N/A 07/12/2019   Procedure: COLONOSCOPY;  Surgeon: Rogene Houston, MD;  [facility];  Service: Endoscopy;  Laterality: N/A;  . ESOPHAGOGASTRODUODENOSCOPY    . ESOPHAGOGASTRODUODENOSCOPY N/A 01/29/2016   Procedure: ESOPHAGOGASTRODUODENOSCOPY (EGD);  Surgeon: Rogene Houston, MD;  [facility];  Service: Endoscopy;  Laterality: N/A;  . ESOPHAGOGASTRODUODENOSCOPY N/A 10/14/2017   Procedure: ESOPHAGOGASTRODUODENOSCOPY (EGD);  Surgeon: Rogene Houston, MD;  [facility];  Service: Endoscopy;  Laterality: N/A;  730  . ESOPHAGOGASTRODUODENOSCOPY N/A 07/12/2019   Procedure: ESOPHAGOGASTRODUODENOSCOPY (EGD);  Surgeon: Rogene Houston, MD;  [facility];  Service: Endoscopy;  Laterality: N/A;  . ESOPHAGOGASTRODUODENOSCOPY N/A 10/03/2019   Procedure: ESOPHAGOGASTRODUODENOSCOPY (EGD);  Surgeon: Rogene Houston, MD;  [facility];  Service: Endoscopy;  Laterality: N/A;  . ESOPHAGOGASTRODUODENOSCOPY (EGD) WITH PROPOFOL N/A 10/31/2020   Procedure: ESOPHAGOGASTRODUODENOSCOPY (EGD) WITH PROPOFOL;  Surgeon: Harvel Quale, MD;  [facility];  Service: Gastroenterology;  Laterality: N/A;  . EYE SURGERY Right 3/15   cataracts  . GIVENS CAPSULE STUDY N/A 07/29/2019   Procedure: GIVENS  CAPSULE STUDY;  Surgeon: Rogene Houston, MD;  [facility];  Service: Endoscopy;  Laterality: N/A;  . GIVENS CAPSULE STUDY N/A 08/20/2019   Procedure: GIVENS CAPSULE STUDY;  Surgeon: Rogene Houston, MD;  [facility];  Service: Endoscopy;  Laterality: N/A;  730am  . GIVENS CAPSULE STUDY  10/03/2019   Procedure: GIVENS CAPSULE STUDY;  Surgeon: Rogene Houston, MD;  [facility];  Service: Endoscopy;;  . HOT HEMOSTASIS  10/31/2020   Procedure: HOT HEMOSTASIS (ARGON PLASMA COAGULATION/BICAP);  Surgeon: Montez Morita, Quillian Quince, MD;  [facility];  Service: Gastroenterology;;  . Meryl Crutch (IM) NAIL INTERTROCHANTERIC Left 01/07/2019   Procedure: INTRAMEDULLARY (IM) NAIL INTERTROCHANTRIC;  Surgeon: Carole Civil, MD;  [facility];  Service: Orthopedics;  Laterality: Left;  . KYPHOPLASTY N/A 07/08/2014   Procedure: Thoracic Eleven Kyphoplasty;  Surgeon: Hosie Spangle, MD;  Location: [place] [facility];  Service: Neurosurgery;  Laterality: N/A;  . left hip surgery    . LUMBAR LAMINECTOMY/DECOMPRESSION MICRODISCECTOMY Bilateral 05/20/2013   Procedure: LUMBAR LAMINECTOMY/DECOMPRESSION MICRODISCECTOMY 1 LEVEL;  Surgeon: Hosie Spangle, MD;  Location: [place] [facility];  Service: Neurosurgery;  Laterality: Bilateral;  Bilateral Lumbar four-five laminotomy and left lumbar four-five microdiskectomy  . RIGHT/LEFT HEART CATH AND CORONARY ANGIOGRAPHY N/A 03/31/2019   Procedure: RIGHT/LEFT HEART CATH AND CORONARY ANGIOGRAPHY;  Surgeon: Sherren Mocha, MD;  Location: [place] [facility];  Service: Cardiovascular;  Laterality: N/A;  . SPINE SURGERY     Dr Carloyn Manner -  vertebraplasty     Current Outpatient Medications  Medication Sig Dispense Refill  . acetaminophen (TYLENOL) 500 MG tablet Take 500 mg by mouth every 8 (eight) hours as needed for mild pain, moderate pain or headache.     . albuterol (VENTOLIN HFA) 108 (90 Base) MCG/ACT inhaler Inhale 2 puffs into  the lungs every 6 (six) hours as needed for wheezing or shortness of breath (cough). 18 g 11  . ALPRAZolam (XANAX) 0.5 MG tablet Take 1 tablet (0.5 mg total) by mouth 2 (two) times daily. 60 tablet 2  . B Complex-C-Folic Acid (B COMPLEX-VITAMIN C-FOLIC ACID) 1 MG tablet Take 1 tablet by mouth daily with breakfast.    . bisoprolol (ZEBETA) 5 MG tablet Take 1 tablet (5 mg total) by mouth daily. 90 tablet 3  . calcium carbonate (TUMS - DOSED IN MG ELEMENTAL CALCIUM) 500 MG chewable tablet Chew 2 tablets (400 mg of elemental calcium total) by mouth daily.    . cholecalciferol (VITAMIN D) 1000 UNITS tablet Take 2,000 Units by mouth daily.     Marland Kitchen escitalopram (LEXAPRO) 20 MG tablet Take 1 tablet (20 mg total) by mouth at bedtime. 30 tablet 5  . omeprazole (PRILOSEC) 40 MG capsule Take 1 capsule (40 mg total) by mouth in the morning and at bedtime. 60 capsule 11  . traZODone (DESYREL) 150 MG tablet Use from 1/3 to 1 tablet nightly as needed for sleep. 30 tablet 5  No current facility-administered medications for this visit.    ROS:  Please see the history of present illness.   Otherwise, review of systems are positive for fatigue and weakness.   All other systems are reviewed and negative.    PHYSICAL EXAM: VS:  BP (!) 150/92   Pulse (!) 106   Ht 5' 7"  (1.702 m)   Wt 125 lb (56.7 kg)   BMI 19.58 kg/m  , BMI Body mass index is 19.58 kg/m.Marland Kitchen GEN:  No distress, chronically ill appearing.  NECK:  No jugular venous distention at 90 degrees, waveform within normal limits, carotid upstroke brisk and symmetric, no bruits, no thyromegaly LYMPHATICS:  No cervical adenopathy LUNGS:  Clear to auscultation bilaterally BACK:  No CVA tenderness CHEST:  Unremarkable HEART:  S1 and S2 within normal limits, no S3, no clicks, no rubs, no murmurs, irregular ABD:  Positive bowel sounds normal in frequency in pitch, no bruits, no rebound, no guarding, unable to assess midline mass or bruit with the patient  seated. EXT:  2 plus pulses throughout, moderate edema, no cyanosis no clubbing SKIN:  No rashes no nodules NEURO:  Cranial nerves II through XII grossly intact, motor grossly intact throughout PSYCH:  Cognitively intact, oriented to person place and time   EKG:  EKG is  ordered today. Atrial fibrillation, rate 106, left axis deviation, no acute ST-T wave changes.  Recent Labs: 04/12/2020: TSH 1.250 01/25/2021: ALT 9; BUN 10; Creatinine, Ser 0.82; Hemoglobin 9.3; Platelets 299; Potassium 4.6; Sodium 141    Lipid Panel    Component Value Date/Time   CHOL 133 04/12/2020 1547   TRIG 137 04/12/2020 1547   TRIG 72 01/09/2015 0948   HDL 54 04/12/2020 1547   HDL 52 01/09/2015 0948   CHOLHDL 2.5 04/12/2020 1547   CHOLHDL 2.7 03/26/2019 0224   VLDL 13 03/26/2019 0224   LDLCALC 55 04/12/2020 1547   LDLCALC 73 08/25/2014 0943      Wt Readings from Last 3 Encounters:  03/28/21 125 lb (56.7 kg)  01/25/21 123 lb 12.8 oz (56.2 kg)  11/16/20 126 lb 8 oz (57.4 kg)      Other studies Reviewed: Additional studies/ records that were reviewed today include.  Hospital records from December Review of the above records demonstrates:  See     ASSESSMENT AND PLAN:  CARDIOMYOPATHY:        She seems to be euvolemic.  She is tolerating meds as listed.  She has previously had hypotension and fatigue and still have not been able to titrate meds.  I am going to have her keep a blood pressure and heart rate diary.  She has continued on bisoprolol.  For some reason it fell off the list and we are going to restart it.  MR: This has been moderate on echo.   I will follow-up an echocardiogram likely after the next appointment.  ATRIAL FIB:    Misty [name] has a CHA2DS2 - VASc score of 5.    We had discussion about Watchman.  She does not really consent for this at this point.  She is not an anticoagulation candidate.  We gave her information to consider about this device and she will let me know.    Her heart rate is usually been well controlled but she is going to keep track on that as a follow-up.  TOBACCO USE:   She cannot quit smoking.  We talked about this again today.   Current medicines are  reviewed at length with the patient today.  The patient does not have concerns regarding medicines.  The following changes have been made:    Labs/ tests ordered today include:   Orders Placed This Encounter  Procedures  . EKG 12-Lead     Disposition:   FU with in 6 weeks.   Signed, Minus Breeding, MD  03/28/2021 3:53 PM    [place]

## 2021-03-28 ENCOUNTER — Other Ambulatory Visit: Payer: Self-pay

## 2021-03-28 ENCOUNTER — Encounter: Payer: Self-pay | Admitting: Cardiology

## 2021-03-28 ENCOUNTER — Ambulatory Visit (INDEPENDENT_AMBULATORY_CARE_PROVIDER_SITE_OTHER): Payer: Medicare Other | Admitting: Cardiology

## 2021-03-28 VITALS — BP 150/92 | HR 106 | Ht 67.0 in | Wt 125.0 lb

## 2021-03-28 DIAGNOSIS — I5022 Chronic systolic (congestive) heart failure: Secondary | ICD-10-CM | POA: Diagnosis not present

## 2021-03-28 DIAGNOSIS — I480 Paroxysmal atrial fibrillation: Secondary | ICD-10-CM

## 2021-03-28 DIAGNOSIS — I340 Nonrheumatic mitral (valve) insufficiency: Secondary | ICD-10-CM | POA: Diagnosis not present

## 2021-03-28 MED ORDER — BISOPROLOL FUMARATE 5 MG PO TABS: 5.0000 mg | ORAL_TABLET | Freq: Every day | ORAL | 3 refills | Status: AC

## 2021-03-28 NOTE — Patient Instructions (Addendum)
Medication Instructions:  Restart Bisoprolol 5 mg a day. Continue all other medications as listed.  *If you need a refill on your cardiac medications before your next appointment, please call your pharmacy*  Please check your heart rate and blood pressure daily. Keep a record of it an bring it to your next office visit.    Follow-Up: At [facility], you and your health needs are our priority.  As part of our continuing mission to provide you with exceptional heart care, we have created designated Provider Care Teams.  These Care Teams include your primary Cardiologist (physician) and Advanced Practice Providers (APPs -  Physician Assistants and Nurse Practitioners) who all work together to provide you with the care you need, when you need it.  We recommend signing up for the patient portal called "MyChart".  Sign up information is provided on this After Visit Summary.  MyChart is used to connect with patients for Virtual Visits (Telemedicine).  Patients are able to view lab/test results, encounter notes, upcoming appointments, etc.  Non-urgent messages can be sent to your provider as well.   To learn more about what you can do with MyChart, go to [email or web address].    Your next appointment:   6 weeks  The format for your next appointment:   In Person  Provider:   Minus Breeding, MD  Thank you for choosing [facility]!!     Watchman Device

## 2021-04-27 ENCOUNTER — Other Ambulatory Visit: Payer: Self-pay | Admitting: Family Medicine

## 2021-05-03 ENCOUNTER — Other Ambulatory Visit: Payer: Self-pay

## 2021-05-03 ENCOUNTER — Encounter: Payer: Self-pay | Admitting: Family Medicine

## 2021-05-03 ENCOUNTER — Ambulatory Visit (INDEPENDENT_AMBULATORY_CARE_PROVIDER_SITE_OTHER): Payer: Medicare Other | Admitting: Family Medicine

## 2021-05-03 VITALS — BP 135/70 | HR 68 | Temp 97.5°F | Ht 67.0 in | Wt 123.0 lb

## 2021-05-03 DIAGNOSIS — I700 Atherosclerosis of aorta: Secondary | ICD-10-CM

## 2021-05-03 DIAGNOSIS — F339 Major depressive disorder, recurrent, unspecified: Secondary | ICD-10-CM

## 2021-05-03 DIAGNOSIS — I429 Cardiomyopathy, unspecified: Secondary | ICD-10-CM | POA: Diagnosis not present

## 2021-05-03 DIAGNOSIS — J8410 Pulmonary fibrosis, unspecified: Secondary | ICD-10-CM

## 2021-05-03 DIAGNOSIS — K2100 Gastro-esophageal reflux disease with esophagitis, without bleeding: Secondary | ICD-10-CM | POA: Diagnosis not present

## 2021-05-03 DIAGNOSIS — D500 Iron deficiency anemia secondary to blood loss (chronic): Secondary | ICD-10-CM | POA: Diagnosis not present

## 2021-05-03 DIAGNOSIS — F411 Generalized anxiety disorder: Secondary | ICD-10-CM | POA: Diagnosis not present

## 2021-05-03 DIAGNOSIS — K900 Celiac disease: Secondary | ICD-10-CM

## 2021-05-03 LAB — CBC WITH DIFFERENTIAL/PLATELET
Basophils Absolute: 0.1 10*3/uL (ref 0.0–0.2)
Basos: 1 %
EOS (ABSOLUTE): 0.9 10*3/uL — ABNORMAL HIGH (ref 0.0–0.4)
Eos: 13 %
Hematocrit: 35 % (ref 34.0–46.6)
Hemoglobin: 10.9 g/dL — ABNORMAL LOW (ref 11.1–15.9)
Immature Grans (Abs): 0 10*3/uL (ref 0.0–0.1)
Immature Granulocytes: 0 %
Lymphocytes Absolute: 0.8 10*3/uL (ref 0.7–3.1)
Lymphs: 12 %
MCH: 29.1 pg (ref 26.6–33.0)
MCHC: 31.1 g/dL — ABNORMAL LOW (ref 31.5–35.7)
MCV: 94 fL (ref 79–97)
Monocytes Absolute: 0.5 10*3/uL (ref 0.1–0.9)
Monocytes: 8 %
Neutrophils Absolute: 4.5 10*3/uL (ref 1.4–7.0)
Neutrophils: 66 %
Platelets: 259 10*3/uL (ref 150–450)
RBC: 3.74 x10E6/uL — ABNORMAL LOW (ref 3.77–5.28)
RDW: 15 % (ref 11.7–15.4)
WBC: 6.8 10*3/uL (ref 3.4–10.8)

## 2021-05-03 LAB — CMP14+EGFR
ALT: 9 IU/L (ref 0–32)
AST: 14 IU/L (ref 0–40)
Albumin/Globulin Ratio: 2 (ref 1.2–2.2)
Albumin: 4.2 g/dL (ref 3.7–4.7)
Alkaline Phosphatase: 124 IU/L — ABNORMAL HIGH (ref 44–121)
BUN/Creatinine Ratio: 14 (ref 12–28)
BUN: 10 mg/dL (ref 8–27)
Bilirubin Total: <0.2 mg/dL (ref 0.0–1.2)
CO2: 24 mmol/L (ref 20–29)
Calcium: 8.9 mg/dL (ref 8.7–10.3)
Chloride: 106 mmol/L (ref 96–106)
Creatinine, Ser: 0.72 mg/dL (ref 0.57–1.00)
Globulin, Total: 2.1 g/dL (ref 1.5–4.5)
Glucose: 111 mg/dL — ABNORMAL HIGH (ref 65–99)
Potassium: 4.1 mmol/L (ref 3.5–5.2)
Sodium: 145 mmol/L — ABNORMAL HIGH (ref 134–144)
Total Protein: 6.3 g/dL (ref 6.0–8.5)
eGFR: 84 mL/min/{1.73_m2} (ref >59)

## 2021-05-03 MED ORDER — ALPRAZOLAM 0.5 MG PO TABS: 0.5000 mg | ORAL_TABLET | Freq: Two times a day (BID) | ORAL | 2 refills | Status: DC

## 2021-05-03 NOTE — Progress Notes (Signed)
Subjective:  Patient ID: Misty [name], female    DOB: [birth year]  Age: 80 y.o. MRN: [id number]  CC: Medical Management of Chronic Issues   HPI Misty [name] presents for Patient in for follow-up of GERD. Currently asymptomatic taking  PPI daily. There is no chest pain or heartburn. No hematemesis and no melena. No dysphagia or choking. Onset is remote. Progression is stable. Complicating factors, none.  PT. Has history of anemia. She feels as if her hemoglobin is dropping. She feels like she has when it has been low before. She feels less energy and cold. She has required transfusions in the past. Needs blood work to see if transfusion will be needed.    Pt. Is chronically dyspneic due to pulmonary fibrosis. That is stable currently with O2.   She has been using xanax for anxiety for many years. This is used to supplement the lexapro.   GAD 7 : Generalized Anxiety Score 05/03/2021 04/20/2020 01/10/2020 10/11/2019  Nervous, Anxious, on Edge _0 Control/stop worrying 1 0 2 1  Worry too much - different things _1 Trouble relaxing 1 0 2 1  Restless 0 0 0 0  Easily annoyed or irritable 0 0 1 0  Afraid - awful might happen 1 0 1 0  Total GAD 7 Score _2 Anxiety Difficulty Somewhat difficult Not difficult at all - -      Depression screen [facility] 2/9 05/03/2021 05/03/2021 01/25/2021  Decreased Interest 0 0 0  Down, Depressed, Hopeless 0 0 0  PHQ - 2 Score 0 0 0  Altered sleeping 0 - -  Tired, decreased energy 1 - -  Change in appetite 0 - -  Feeling bad or failure about yourself  0 - -  Trouble concentrating 0 - -  Moving slowly or fidgety/restless 0 - -  Suicidal thoughts 0 - -  PHQ-9 Score 1 - -  Difficult doing work/chores Not difficult at all - -  Some recent data might be hidden    History Misty [name] has a past medical history of Anxiety, Arthritis, Atrial fibrillation with RVR ([place]) (03/25/2019), Cataract, Chronic back pain, Colitis, Constipation, COPD (chronic  obstructive pulmonary disease) ([place]), Depression, Diverticulosis, Educated about COVID-19 virus infection (04/07/2019), Elevated troponin (03/25/2019), GERD (gastroesophageal reflux disease), H/O kyphoplasty (01/14/2019), Hematochezia (02/26/2015), Hemorrhoids, History of bronchitis, History of migraine, History of shingles, Hyperlipidemia, Hypokalemia (01/10/2019), Insomnia, Migraines, Nontraumatic compression fracture of T11 vertebra ([place]) (07/08/2014), Osteoporosis, and PONV (postoperative nausea and vomiting).   She has a past surgical history that includes Abdominal hysterectomy; Esophagogastroduodenoscopy; Colonoscopy; Lumbar laminectomy/decompression microdiscectomy (Bilateral, 05/20/2013); Eye surgery (Right, 3/15); Kyphoplasty (N/A, 07/08/2014); Colonoscopy (N/A, 02/17/2015); Esophagogastroduodenoscopy (N/A, 01/29/2016); Esophagogastroduodenoscopy (N/A, 10/14/2017); Spine surgery; Intramedullary (im) nail intertrochanteric (Left, 01/07/2019); RIGHT/LEFT HEART CATH AND CORONARY ANGIOGRAPHY (N/A, 03/31/2019); left hip surgery; Colonoscopy (N/A, 07/12/2019); Esophagogastroduodenoscopy (N/A, 07/12/2019); biopsy (07/12/2019); Givens capsule study (N/A, 07/29/2019); Givens capsule study (N/A, 08/20/2019); Esophagogastroduodenoscopy (N/A, 10/03/2019); Givens capsule study (10/03/2019); Esophagogastroduodenoscopy (egd) with propofol (N/A, 10/31/2020); biopsy (10/31/2020); and Hot hemostasis (10/31/2020).   Her family history includes Cirrhosis in her son; Heart attack in her mother; Heart disease in her father and son; Hypertension in her mother; Macular degeneration in her father.She reports that she has been smoking cigarettes. She started smoking about 62 years ago. She has a 52.00 pack-year smoking history. She has never used smokeless tobacco. She reports that she does not drink alcohol and does not use drugs.  ROS Review of Systems  Constitutional: Negative.   HENT: Negative.    Eyes:  Negative for visual  disturbance.  Respiratory:  Positive for shortness of breath.   Cardiovascular:  Negative for chest pain.  Gastrointestinal:  Negative for abdominal pain.  Musculoskeletal:  Positive for arthralgias and back pain.   Objective:  BP 135/70   Pulse 68   Temp (!) 97.5 F (36.4 C)   Ht 5' 7"  (1.702 m)   Wt 123 lb (55.8 kg)   SpO2 96%   BMI 19.26 kg/m   BP Readings from Last 3 Encounters:  05/03/21 135/70  03/28/21 (!) 150/92  01/25/21 133/63    Wt Readings from Last 3 Encounters:  05/03/21 123 lb (55.8 kg)  03/28/21 125 lb (56.7 kg)  01/25/21 123 lb 12.8 oz (56.2 kg)     Physical Exam Constitutional:      General: She is not in acute distress.    Appearance: She is well-developed. She is ill-appearing.  HENT:     [name] Normocephalic and atraumatic.  Eyes:     Conjunctiva/sclera: Conjunctivae normal.     Pupils: Pupils are equal, round, and reactive to light.  Neck:     Thyroid: No thyromegaly.  Cardiovascular:     Rate and Rhythm: Normal rate and regular rhythm.     Heart sounds: Normal heart sounds. No murmur heard. Pulmonary:     Effort: Pulmonary effort is normal. No respiratory distress.     Breath sounds: Rhonchi present. No wheezing or rales.  Abdominal:     General: Bowel sounds are normal. There is no distension.     Palpations: Abdomen is soft.     Tenderness: no abdominal tenderness  Musculoskeletal:        General: Normal range of motion.     Cervical back: Normal range of motion and neck supple.  Lymphadenopathy:     Cervical: No cervical adenopathy.  Skin:    General: Skin is warm and dry.  Neurological:     Mental Status: She is alert and oriented to person, place, and time.  Psychiatric:        Behavior: Behavior normal.        Thought Content: Thought content normal.        Judgment: Judgment normal.      Assessment & Plan:   Misty [name] was seen today for medical management of chronic issues.  Diagnoses and all orders for this  visit:  Iron deficiency anemia due to chronic blood loss -     CBC with Differential/Platelet -     CMP14+EGFR  Celiac disease -     CBC with Differential/Platelet -     CMP14+EGFR  Gastroesophageal reflux disease with esophagitis without hemorrhage -     CBC with Differential/Platelet -     CMP14+EGFR  GAD (generalized anxiety disorder) -     CBC with Differential/Platelet -     CMP14+EGFR  Aortic atherosclerosis (HCC)  Cardiomyopathy, unspecified type ([place])  Pulmonary fibrosis (HCC)  Depression, recurrent ([place])  Other orders -     ALPRAZolam (XANAX) 0.5 MG tablet; Take 1 tablet (0.5 mg total) by mouth 2 (two) times daily.      I am having Misty [name] "[name]" maintain her cholecalciferol, acetaminophen, albuterol, escitalopram, omeprazole, B complex-vitamin C-folic acid, calcium carbonate, traZODone, bisoprolol, and ALPRAZolam.  Allergies as of 05/03/2021       Reactions   Codeine Nausea And Vomiting   Tramadol Nausea And Vomiting  Asa [aspirin] Other (See Comments)   Patient is unaware of allergy   Azithromycin Other (See Comments)   Patient is unaware of allergy   Celebrex [celecoxib] Other (See Comments)   Irritated stomach   Cymbalta [duloxetine Hcl] Other (See Comments)   Felt groggy   Prednisone Rash   She has seen the podiatrist and he had injected cortisone in her feet and she had also taken a peel at home. She had a severe reaction to her face with a rash and had to take Benadryl to resolve the symptom. She actually did get more cortisone shots, but no additional reactions to the shots.   Vioxx [rofecoxib] Other (See Comments)   Unknown   Zelnorm [tegaserod] Other (See Comments)   Patient is unaware of allergy   Zocor [simvastatin] Other (See Comments)   Patient is unaware of allergy        Medication List        Accurate as of May 03, 2021 11:59 PM. If you have any questions, ask your nurse or doctor.          acetaminophen 500  MG tablet Commonly known as: TYLENOL Take 500 mg by mouth every 8 (eight) hours as needed for mild pain, moderate pain or headache.   albuterol 108 (90 Base) MCG/ACT inhaler Commonly known as: VENTOLIN HFA Inhale 2 puffs into the lungs every 6 (six) hours as needed for wheezing or shortness of breath (cough).   ALPRAZolam 0.5 MG tablet Commonly known as: XANAX Take 1 tablet (0.5 mg total) by mouth 2 (two) times daily.   B complex-vitamin C-folic acid 1 MG tablet Take 1 tablet by mouth daily with breakfast.   bisoprolol 5 MG tablet Commonly known as: ZEBETA Take 1 tablet (5 mg total) by mouth daily.   calcium carbonate 500 MG chewable tablet Commonly known as: TUMS - dosed in mg elemental calcium Chew 2 tablets (400 mg of elemental calcium total) by mouth daily.   cholecalciferol 1000 units tablet Commonly known as: VITAMIN D Take 2,000 Units by mouth daily.   escitalopram 20 MG tablet Commonly known as: LEXAPRO Take 1 tablet (20 mg total) by mouth at bedtime.   omeprazole 40 MG capsule Commonly known as: PRILOSEC Take 1 capsule (40 mg total) by mouth in the morning and at bedtime.   traZODone 150 MG tablet Commonly known as: DESYREL Use from 1/3 to 1 tablet nightly as needed for sleep.         Follow-up: Return in about 3 months (around 08/03/2021).  Claretta Fraise, M.D.

## 2021-05-06 ENCOUNTER — Encounter: Payer: Self-pay | Admitting: Family Medicine

## 2021-05-09 ENCOUNTER — Telehealth: Payer: Self-pay | Admitting: Family Medicine

## 2021-05-09 NOTE — Telephone Encounter (Signed)
Reviewed labs with patient again.

## 2021-05-13 DIAGNOSIS — I4819 Other persistent atrial fibrillation: Secondary | ICD-10-CM | POA: Insufficient documentation

## 2021-05-13 NOTE — Progress Notes (Signed)
Cardiology Office Note   Date:  05/16/2021   ID:  Misty [name], Misty [name] Oct 27, 1941, MRN [id number]  PCP:  Claretta Fraise, MD  Cardiologist:   Minus Breeding, MD Referring:  Claretta Fraise, MD  Chief Complaint  Patient presents with   Fatigue       History of Present Illness: Misty [name] is a 80 y.o. female who presents for follow-up of atrial fib , cardiomyopathy and GI bleed.   She has mild nonobstructive coronary disease.  This was when she was in the hospital with respiratory failure.  She had pleural effusions. We have been managing her reduced ejection fraction medically.  She was in the hospital in August 2021 with anemia.   She has had an extensive work up and re challenges with DOAC as previously outlined and ultimately was taken off.  Her last hospitalization for anemia was Dec 2021.    She has had no new acute.  Her hemoglobin is 10.9 and seems to be stable.  She is fatigued but may be slightly better.  She still gets around slowly with a walker.  She is not having any new chest pressure, neck or arm discomfort.  She is not noticing any palpitations has had no presyncope or syncope.  She brings me a blood pressure diary with heart rate saturation and they are all fine with blood pressures in the 130s.  Her 76s.  Her oxygen saturations typically in the low to mid 90s.  Past Medical History:  Diagnosis Date   Anxiety    takes Xanax daily   Arthritis    back   Atrial fibrillation with RVR (HCC) 03/25/2019   Cataract    Chronic back pain    Colitis    Constipation    OTC stool softener prn   COPD (chronic obstructive pulmonary disease) ([place])    Depression    Diverticulosis    Educated about COVID-19 virus infection 04/07/2019   Elevated troponin 03/25/2019   GERD (gastroesophageal reflux disease)    takes Ranidine daily   H/O kyphoplasty 01/14/2019   Hematochezia 02/26/2015   Hemorrhoids    History of bronchitis    History of migraine    many yrs ago   History  of shingles    Hyperlipidemia    Hypokalemia 01/10/2019   Insomnia    Migraines    Nontraumatic compression fracture of T11 vertebra ([place]) 07/08/2014   Osteoporosis    PONV (postoperative nausea and vomiting)     Past Surgical History:  Procedure Laterality Date   ABDOMINAL HYSTERECTOMY     BIOPSY  07/12/2019   Procedure: BIOPSY;  Surgeon: Rogene Houston, MD;  [facility];  Service: Endoscopy;;  duodenum   BIOPSY  10/31/2020   Procedure: BIOPSY;  Surgeon: Harvel Quale, MD;  [facility];  Service: Gastroenterology;;   COLONOSCOPY     COLONOSCOPY N/A 02/17/2015   Procedure: COLONOSCOPY;  Surgeon: Rogene Houston, MD;  [facility];  Service: Endoscopy;  Laterality: N/A;  110n - moved to 11:15 - Ann to notify pt   COLONOSCOPY N/A 07/12/2019   Procedure: COLONOSCOPY;  Surgeon: Rogene Houston, MD;  [facility];  Service: Endoscopy;  Laterality: N/A;   ESOPHAGOGASTRODUODENOSCOPY     ESOPHAGOGASTRODUODENOSCOPY N/A 01/29/2016   Procedure: ESOPHAGOGASTRODUODENOSCOPY (EGD);  Surgeon: Rogene Houston, MD;  [facility];  Service: Endoscopy;  Laterality: N/A;   ESOPHAGOGASTRODUODENOSCOPY N/A 10/14/2017   Procedure: ESOPHAGOGASTRODUODENOSCOPY (  EGD);  Surgeon: Rogene Houston, MD;  [facility];  Service: Endoscopy;  Laterality: N/A;  730   ESOPHAGOGASTRODUODENOSCOPY N/A 07/12/2019   Procedure: ESOPHAGOGASTRODUODENOSCOPY (EGD);  Surgeon: Rogene Houston, MD;  [facility];  Service: Endoscopy;  Laterality: N/A;   ESOPHAGOGASTRODUODENOSCOPY N/A 10/03/2019   Procedure: ESOPHAGOGASTRODUODENOSCOPY (EGD);  Surgeon: Rogene Houston, MD;  [facility];  Service: Endoscopy;  Laterality: N/A;   ESOPHAGOGASTRODUODENOSCOPY (EGD) WITH PROPOFOL N/A 10/31/2020   Procedure: ESOPHAGOGASTRODUODENOSCOPY (EGD) WITH PROPOFOL;  Surgeon: Harvel Quale, MD;  [facility];  Service:  Gastroenterology;  Laterality: N/A;   EYE SURGERY Right 3/15   cataracts   GIVENS CAPSULE STUDY N/A 07/29/2019   Procedure: GIVENS CAPSULE STUDY;  Surgeon: Rogene Houston, MD;  [facility];  Service: Endoscopy;  Laterality: N/A;   GIVENS CAPSULE STUDY N/A 08/20/2019   Procedure: GIVENS CAPSULE STUDY;  Surgeon: Rogene Houston, MD;  [facility];  Service: Endoscopy;  Laterality: N/A;  [place] STUDY  10/03/2019   Procedure: GIVENS CAPSULE STUDY;  Surgeon: Rogene Houston, MD;  [facility];  Service: Endoscopy;;   HOT HEMOSTASIS  10/31/2020   Procedure: HOT HEMOSTASIS (ARGON PLASMA COAGULATION/BICAP);  Surgeon: Montez Morita, Quillian Quince, MD;  [facility];  Service: Gastroenterology;;   INTRAMEDULLARY (IM) NAIL INTERTROCHANTERIC Left 01/07/2019   Procedure: INTRAMEDULLARY (IM) NAIL INTERTROCHANTRIC;  Surgeon: Carole Civil, MD;  [facility]: [facility];  Service: Orthopedics;  Laterality: Left;   KYPHOPLASTY N/A 07/08/2014   Procedure: Thoracic Eleven Kyphoplasty;  Surgeon: Hosie Spangle, MD;  Location: [place] [facility];  Service: Neurosurgery;  Laterality: N/A;   left hip surgery     LUMBAR LAMINECTOMY/DECOMPRESSION MICRODISCECTOMY Bilateral 05/20/2013   Procedure: LUMBAR LAMINECTOMY/DECOMPRESSION MICRODISCECTOMY 1 LEVEL;  Surgeon: Hosie Spangle, MD;  Location: [place];  Service: Neurosurgery;  Laterality: Bilateral;  Bilateral Lumbar four-five laminotomy and left lumbar four-five microdiskectomy   RIGHT/LEFT HEART CATH AND CORONARY ANGIOGRAPHY N/A 03/31/2019   Procedure: RIGHT/LEFT HEART CATH AND CORONARY ANGIOGRAPHY;  Surgeon: Sherren Mocha, MD;  Location: [place];  Service: Cardiovascular;  Laterality: N/A;   SPINE SURGERY     Dr Carloyn Manner -  vertebraplasty     Current Outpatient Medications  Medication Sig Dispense Refill   acetaminophen (TYLENOL) 500 MG tablet Take 500 mg by mouth every 8 (eight) hours as needed  for mild pain, moderate pain or headache.      ALPRAZolam (XANAX) 0.5 MG tablet Take 1 tablet (0.5 mg total) by mouth 2 (two) times daily. 60 tablet 2   B Complex-C-Folic Acid (B COMPLEX-VITAMIN C-FOLIC ACID) 1 MG tablet Take 1 tablet by mouth daily with breakfast.     calcium carbonate (TUMS - DOSED IN MG ELEMENTAL CALCIUM) 500 MG chewable tablet Chew 2 tablets (400 mg of elemental calcium total) by mouth daily.     cholecalciferol (VITAMIN D) 1000 UNITS tablet Take 2,000 Units by mouth daily.      escitalopram (LEXAPRO) 20 MG tablet Take 1 tablet (20 mg total) by mouth at bedtime. 30 tablet 5   losartan (COZAAR) 25 MG tablet Take 0.5 tablets (12.5 mg total) by mouth daily. 45 tablet 3   omeprazole (PRILOSEC) 40 MG capsule Take 1 capsule (40 mg total) by mouth in the morning and at bedtime. 60 capsule 11   PROAIR HFA 108 (90 Base) MCG/ACT inhaler 2 puffs in lungs every 6 hours  as needed wheezing/short of breath (cough). 8.5 g 1   traZODone (DESYREL) 150 MG tablet Use from 1/3 to 1 tablet nightly as needed for sleep. 30 tablet 5   bisoprolol (ZEBETA) 5 MG tablet Take 1 tablet (5 mg total) by mouth daily. 90 tablet 3   No current facility-administered medications for this visit.    ROS:  Please see the history of present illness.   Otherwise, review of systems are positive for none.   All other systems are reviewed and negative.    PHYSICAL EXAM: VS:  BP 136/68   Pulse 84   Ht 5' 7"  (1.702 m)   Wt 122 lb (55.3 kg)   BMI 19.11 kg/m  , BMI Body mass index is 19.11 kg/m.Marland Kitchen GENERAL: Frail appearing appearing NECK:  No jugular venous distention, waveform within normal limits, carotid upstroke brisk and symmetric, no bruits, no thyromegaly LUNGS:  Clear to auscultation bilaterally CHEST:  Unremarkable HEART:  PMI not displaced or sustained,S1 and S2 within normal limits, no S3, no clicks, no rubs, no murmurs, irregular ABD:  Flat, positive bowel sounds normal in frequency in pitch, no bruits,  no rebound, no guarding, no midline pulsatile mass, no hepatomegaly, no splenomegaly EXT:  2 plus pulses throughout, no edema, no cyanosis no clubbing   EKG:  EKG is not ordered today.   Recent Labs: 05/03/2021: ALT 9; BUN 10; Creatinine, Ser 0.72; Hemoglobin 10.9; Platelets 259; Potassium 4.1; Sodium 145    Lipid Panel    Component Value Date/Time   CHOL 133 04/12/2020 1547   TRIG 137 04/12/2020 1547   TRIG 72 01/09/2015 0948   HDL 54 04/12/2020 1547   HDL 52 01/09/2015 0948   CHOLHDL 2.5 04/12/2020 1547   CHOLHDL 2.7 03/26/2019 0224   VLDL 13 03/26/2019 0224   LDLCALC 55 04/12/2020 1547   LDLCALC 73 08/25/2014 0943      Wt Readings from Last 3 Encounters:  05/16/21 122 lb (55.3 kg)  05/03/21 123 lb (55.8 kg)  03/28/21 125 lb (56.7 kg)      Other studies Reviewed: Additional studies/ records that were reviewed today include.  Labs Review of the above records demonstrates:  See elsewhere    ASSESSMENT AND PLAN:  CARDIOMYOPATHY:      She seems to be euvolemic.  Today she will allow me to do so med titration given the fact that she is a little less fatigued than previous although still quite frail.  I am going to gently in Cozaar 12.5 mg once daily.  MR: This has been moderate on echo.   I will follow this up in the future once we have completed med titration.  ATRIAL FIB:    Ms. LATORA QUARRY has a CHA2DS2 - VASc score of 5.   She has failed anticoagulation.  I gave him literature around [place] and she talked briefly to her daughter about this but they have not wanted to consider this yet.   TOBACCO USE:   She cannot quit smoking and we have talked about this at length.   Current medicines are reviewed at length with the patient today.  The patient does not have concerns regarding medicines.  The following changes have been made: As above  Labs/ tests ordered today include: None  No orders of the defined types were placed in this  encounter.    Disposition:   FU with in 2 - 3 months.   Signed, Minus Breeding, MD  05/16/2021 3:45 PM  [facility]

## 2021-05-15 ENCOUNTER — Other Ambulatory Visit: Payer: Self-pay | Admitting: Family Medicine

## 2021-05-16 ENCOUNTER — Ambulatory Visit (INDEPENDENT_AMBULATORY_CARE_PROVIDER_SITE_OTHER): Payer: Medicare Other | Admitting: Cardiology

## 2021-05-16 ENCOUNTER — Encounter: Payer: Self-pay | Admitting: Cardiology

## 2021-05-16 ENCOUNTER — Other Ambulatory Visit: Payer: Self-pay

## 2021-05-16 VITALS — BP 136/68 | HR 84 | Ht 67.0 in | Wt 122.0 lb

## 2021-05-16 DIAGNOSIS — I420 Dilated cardiomyopathy: Secondary | ICD-10-CM

## 2021-05-16 DIAGNOSIS — I340 Nonrheumatic mitral (valve) insufficiency: Secondary | ICD-10-CM | POA: Diagnosis not present

## 2021-05-16 DIAGNOSIS — I5022 Chronic systolic (congestive) heart failure: Secondary | ICD-10-CM | POA: Diagnosis not present

## 2021-05-16 DIAGNOSIS — I4819 Other persistent atrial fibrillation: Secondary | ICD-10-CM

## 2021-05-16 MED ORDER — LOSARTAN POTASSIUM 25 MG PO TABS: 12.5000 mg | ORAL_TABLET | Freq: Every day | ORAL | 3 refills | Status: DC

## 2021-05-16 NOTE — Patient Instructions (Signed)
Medication Instructions:  Please start Losartan (Cozaar) 25 mg 1/2 tablet daily. Continue all other medications as listed.  *If you need a refill on your cardiac medications before your next appointment, please call your pharmacy*  Follow-Up: At [facility], you and your health needs are our priority.  As part of our continuing mission to provide you with exceptional heart care, we have created designated Provider Care Teams.  These Care Teams include your primary Cardiologist (physician) and Advanced Practice Providers (APPs -  Physician Assistants and Nurse Practitioners) who all work together to provide you with the care you need, when you need it.  We recommend signing up for the patient portal called "MyChart".  Sign up information is provided on this After Visit Summary.  MyChart is used to connect with patients for Virtual Visits (Telemedicine).  Patients are able to view lab/test results, encounter notes, upcoming appointments, etc.  Non-urgent messages can be sent to your provider as well.   To learn more about what you can do with MyChart, go to [email or web address].    Your next appointment:   2 - 3 month(s)  The format for your next appointment:   In Person  Provider:   Minus Breeding, MD   Thank you for choosing [facility]!!

## 2021-07-05 DIAGNOSIS — M79676 Pain in unspecified toe(s): Secondary | ICD-10-CM | POA: Diagnosis not present

## 2021-07-05 DIAGNOSIS — B351 Tinea unguium: Secondary | ICD-10-CM | POA: Diagnosis not present

## 2021-07-17 ENCOUNTER — Ambulatory Visit (INDEPENDENT_AMBULATORY_CARE_PROVIDER_SITE_OTHER): Payer: Medicare Other | Admitting: Internal Medicine

## 2021-07-17 ENCOUNTER — Ambulatory Visit (INDEPENDENT_AMBULATORY_CARE_PROVIDER_SITE_OTHER): Payer: Medicare Other | Admitting: Gastroenterology

## 2021-07-17 ENCOUNTER — Other Ambulatory Visit: Payer: Self-pay

## 2021-07-17 ENCOUNTER — Encounter (INDEPENDENT_AMBULATORY_CARE_PROVIDER_SITE_OTHER): Payer: Self-pay | Admitting: Gastroenterology

## 2021-07-17 VITALS — BP 145/68 | HR 80 | Temp 97.9°F | Ht 67.0 in | Wt 121.8 lb

## 2021-07-17 DIAGNOSIS — K900 Celiac disease: Secondary | ICD-10-CM

## 2021-07-17 DIAGNOSIS — D649 Anemia, unspecified: Secondary | ICD-10-CM | POA: Diagnosis not present

## 2021-07-17 DIAGNOSIS — K2100 Gastro-esophageal reflux disease with esophagitis, without bleeding: Secondary | ICD-10-CM | POA: Diagnosis not present

## 2021-07-17 NOTE — Patient Instructions (Addendum)
Please continue your omeprazole 79m twice a day You can add over the counter famotidine (pepcid) as needed in the evenings to see if this helps better control your reflux. Please continue to avoid gluten as much as possible and avoid greasy/spicy foods.   Follow up in 6 months

## 2021-07-17 NOTE — Progress Notes (Signed)
Referring Provider: Claretta Fraise, MD Primary Care Physician:  Claretta Fraise, MD Primary GI Physician:   Chief Complaint  Patient presents with   Follow-up    Patient here today for a follow up. She states she has some issues with gas,bloating and heartburn. She has on average 2 bm's per day and her appetite is good.   HPI:   Misty [name] is a 80 y.o. female with past medical history of anxiety, arthritis, A fib, COPD, diverticulosis, GERD, anemia, and celiac's disease  Patient presenting today for follow up of GERD and Celiac's disease.   GERD: history of erosive esophagitis on most recent EGD in dec 2021. Omeprazole 28m BID currently. States if she eats spicy or greasy foods she will have reflux. States that she is doing tums almost nightly. States that she does wait at least 1-2 hours after eating before going to bed. Denies sore throat or cough. Occasional nausea if she eats too fast or eats greasy foods. Reports she typically does not avoid foods that cause her acid reflux, as she enjoys them too much.  Anemia   Hgb in march 9.3, June 10.9, done by PCP Dr. SLivia Snellen She is doing PO iron supplements. She will have labs done on 9/19. Denies any blood in her stools or melena.  Celiacs: currently taking cyanocobalamin 1000 mcg daily/b complex folic acid daily. She reports that she had been eating a lot of fresh corn over the past few months which gave her som diarrhea, but since she stopped eating corn, she has not had anymore diarrhea. She is trying to follow gluten free diet as much as possible but it is difficult, she is still eating gluten but is trying to only have it in moderation. Having About 2 BMs per day that are formed. She reports that her belly feels bloated/full, denies pain. Reports this has been ongoing, sometimes improves with bowel movements, sometimes maybe worse depending on how much gluten she has consumed.  No red flag symptoms. Patient denies melena,  hematochezia, nausea, vomiting, diarrhea, constipation, dysphagia, odyonophagia, early satiety or weight loss.   Last Colonoscopy:(07/12/19)- Diverticulosis in the sigmoid colon and in the descending colon. - External and internal hemorrhoids.  Last Endoscopy:(10/31/20)- LA Grade D esophagitis with no bleeding. - 2 cm hiatal hernia. - Two polyps below GE junction. - A small amount of food (residue) in the stomach. Removal was successful. - Gastric antral vascular ectasia without bleeding. Treated with argon plasma coagulation (APC). - Scalloped mucosa was found in the duodenum, consistent with celiac disease. (Small intestinal mucosa with intraepithelial lymphocytosis and patchy  mild villous blunting)  Current every day smoker  Past Medical History:  Diagnosis Date   Anxiety    takes Xanax daily   Arthritis    back   Atrial fibrillation with RVR (HCC) 03/25/2019   Cataract    Chronic back pain    Colitis    Constipation    OTC stool softener prn   COPD (chronic obstructive pulmonary disease) ([facility]    Depression    Diverticulosis    Educated about COVID-19 virus infection 04/07/2019   Elevated troponin 03/25/2019   GERD (gastroesophageal reflux disease)    takes Ranidine daily   H/O kyphoplasty 01/14/2019   Hematochezia 02/26/2015   Hemorrhoids    History of bronchitis    History of migraine    many yrs ago   History of shingles    Hyperlipidemia    Hypokalemia 01/10/2019   Insomnia  Migraines    Nontraumatic compression fracture of T11 vertebra (HCC) 07/08/2014   Osteoporosis    PONV (postoperative nausea and vomiting)     Past Surgical History:  Procedure Laterality Date   ABDOMINAL HYSTERECTOMY     BIOPSY  07/12/2019   Procedure: BIOPSY;  Surgeon: Rogene Houston, MD;  [facility];  Service: Endoscopy;;  duodenum   BIOPSY  10/31/2020   Procedure: BIOPSY;  Surgeon: Harvel Quale, MD;  [facility];  Service: Gastroenterology;;    COLONOSCOPY     COLONOSCOPY N/A 02/17/2015   Procedure: COLONOSCOPY;  Surgeon: Rogene Houston, MD;  [facility];  Service: Endoscopy;  Laterality: N/A;  110n - moved to 11:15 - Ann to notify pt   COLONOSCOPY N/A 07/12/2019   Procedure: COLONOSCOPY;  Surgeon: Rogene Houston, MD;  [facility];  Service: Endoscopy;  Laterality: N/A;   ESOPHAGOGASTRODUODENOSCOPY     ESOPHAGOGASTRODUODENOSCOPY N/A 01/29/2016   Procedure: ESOPHAGOGASTRODUODENOSCOPY (EGD);  Surgeon: Rogene Houston, MD;  [facility];  Service: Endoscopy;  Laterality: N/A;   ESOPHAGOGASTRODUODENOSCOPY N/A 10/14/2017   Procedure: ESOPHAGOGASTRODUODENOSCOPY (EGD);  Surgeon: Rogene Houston, MD;  [facility];  Service: Endoscopy;  Laterality: N/A;  730   ESOPHAGOGASTRODUODENOSCOPY N/A 07/12/2019   Procedure: ESOPHAGOGASTRODUODENOSCOPY (EGD);  Surgeon: Rogene Houston, MD;  [facility];  Service: Endoscopy;  Laterality: N/A;   ESOPHAGOGASTRODUODENOSCOPY N/A 10/03/2019   Procedure: ESOPHAGOGASTRODUODENOSCOPY (EGD);  Surgeon: Rogene Houston, MD;  [facility];  Service: Endoscopy;  Laterality: N/A;   ESOPHAGOGASTRODUODENOSCOPY (EGD) WITH PROPOFOL N/A 10/31/2020   Procedure: ESOPHAGOGASTRODUODENOSCOPY (EGD) WITH PROPOFOL;  Surgeon: Harvel Quale, MD;  [facility];  Service: Gastroenterology;  Laterality: N/A;   EYE SURGERY Right 3/15   cataracts   GIVENS CAPSULE STUDY N/A 07/29/2019   Procedure: GIVENS CAPSULE STUDY;  Surgeon: Rogene Houston, MD;  [facility];  Service: Endoscopy;  Laterality: N/A;   GIVENS CAPSULE STUDY N/A 08/20/2019   Procedure: GIVENS CAPSULE STUDY;  Surgeon: Rogene Houston, MD;  [facility];  Service: Endoscopy;  Laterality: N/A;  [place] STUDY  10/03/2019   Procedure: GIVENS CAPSULE STUDY;  Surgeon: Rogene Houston, MD;  [facility];  Service: Endoscopy;;   HOT  HEMOSTASIS  10/31/2020   Procedure: HOT HEMOSTASIS (ARGON PLASMA COAGULATION/BICAP);  Surgeon: Montez Morita, Quillian Quince, MD;  [facility];  Service: Gastroenterology;;   INTRAMEDULLARY (IM) NAIL INTERTROCHANTERIC Left 01/07/2019   Procedure: INTRAMEDULLARY (IM) NAIL INTERTROCHANTRIC;  Surgeon: Carole Civil, MD;  Location: [facility];  Service: Orthopedics;  Laterality: Left;   KYPHOPLASTY N/A 07/08/2014   Procedure: Thoracic Eleven Kyphoplasty;  Surgeon: Hosie Spangle, MD;  Location: [place] [facility];  Service: Neurosurgery;  Laterality: N/A;   left hip surgery     LUMBAR LAMINECTOMY/DECOMPRESSION MICRODISCECTOMY Bilateral 05/20/2013   Procedure: LUMBAR LAMINECTOMY/DECOMPRESSION MICRODISCECTOMY 1 LEVEL;  Surgeon: Hosie Spangle, MD;  Location: [place] [facility];  Service: Neurosurgery;  Laterality: Bilateral;  Bilateral Lumbar four-five laminotomy and left lumbar four-five microdiskectomy   RIGHT/LEFT HEART CATH AND CORONARY ANGIOGRAPHY N/A 03/31/2019   Procedure: RIGHT/LEFT HEART CATH AND CORONARY ANGIOGRAPHY;  Surgeon: Sherren Mocha, MD;  Location: [facility];  Service: Cardiovascular;  Laterality: N/A;   SPINE SURGERY     Dr Carloyn Manner -  vertebraplasty    Current Outpatient Medications  Medication Sig Dispense Refill  acetaminophen (TYLENOL) 500 MG tablet Take 500 mg by mouth every 8 (eight) hours as needed for mild pain, moderate pain or headache.      ALPRAZolam (XANAX) 0.5 MG tablet Take 1 tablet (0.5 mg total) by mouth 2 (two) times daily. 60 tablet 2   B Complex-C-Folic Acid (B COMPLEX-VITAMIN C-FOLIC ACID) 1 MG tablet Take 1 tablet by mouth daily with breakfast.     bisoprolol (ZEBETA) 5 MG tablet Take 1 tablet (5 mg total) by mouth daily. 90 tablet 3   calcium carbonate (TUMS - DOSED IN MG ELEMENTAL CALCIUM) 500 MG chewable tablet Chew 2 tablets (400 mg of elemental calcium total) by mouth daily.     cholecalciferol (VITAMIN D) 1000 UNITS tablet Take 2,000 Units by mouth  daily.      cyanocobalamin 1000 MCG tablet Take 1,000 mcg by mouth daily.     losartan (COZAAR) 25 MG tablet Take 0.5 tablets (12.5 mg total) by mouth daily. 45 tablet 3   omeprazole (PRILOSEC) 40 MG capsule Take 1 capsule (40 mg total) by mouth in the morning and at bedtime. 60 capsule 11   PROAIR HFA 108 (90 Base) MCG/ACT inhaler 2 puffs in lungs every 6 hours as needed wheezing/short of breath (cough). 8.5 g 1   traZODone (DESYREL) 150 MG tablet Use from 1/3 to 1 tablet nightly as needed for sleep. 30 tablet 5   No current facility-administered medications for this visit.    Allergies as of 07/17/2021 - Review Complete 07/17/2021  Allergen Reaction Noted   Codeine Nausea And Vomiting 04/08/2013   Tramadol Nausea And Vomiting 10/07/2017   Asa [aspirin] Other (See Comments) 04/08/2013   Azithromycin Other (See Comments) 04/08/2013   Celebrex [celecoxib] Other (See Comments) 04/08/2013   Cymbalta [duloxetine hcl] Other (See Comments) 04/08/2013   Prednisone Rash 03/25/2013   Vioxx [rofecoxib] Other (See Comments) 04/08/2013   Zelnorm [tegaserod] Other (See Comments) 04/08/2013   Zocor [simvastatin] Other (See Comments) 04/08/2013    Family History  Problem Relation Age of Onset   Hypertension Mother    Heart attack Mother    Macular degeneration Father    Heart disease Father    Cirrhosis Son    Heart disease Son    Colon cancer Neg Hx     Social History   Socioeconomic History   Marital status: Married    Spouse name: Sterling Big    Number of children: Not on file   Years of education: Not on file   Highest education level: Not on file  Occupational History   Occupation: retired     Comment: [place] work - came out in [place] Use   Smoking status: Every Day    Packs/day: 1.00    Years: 52.00    Pack years: 52.00    Types: Cigarettes    Start date: 11/18/1958    Last attempt to quit: 08/20/2011    Years since quitting: 9.9   Smokeless tobacco: Never   Tobacco  comments:    1/2-1pack off and on all her life  Vaping Use   Vaping Use: Never used  Substance and Sexual Activity   Alcohol use: No    Alcohol/week: 0.0 standard drinks   Drug use: No   Sexual activity: Not Currently  Other Topics Concern   Not on file  Social History Narrative   Lives at home with husband, Sterling Big. Had one son- now deceased    Social Determinants of Radio broadcast assistant  Strain: Not on file  Food Insecurity: Not on file  Transportation Needs: Not on file  Physical Activity: Not on file  Stress: Not on file  Social Connections: Not on file    Review of Systems: Gen: Denies fever, chills, anorexia. Denies fatigue, weakness, weight loss.  CV: Denies chest pain, palpitations, syncope, peripheral edema, and claudication. Resp: Denies dyspnea at rest, cough, wheezing, coughing up blood, and pleurisy. GI: Denies vomiting blood, jaundice, and fecal incontinence. Denies dysphagia or odynophagia. +indigestion.  Derm: Denies rash, itching, dry skin Psych: Denies depression, anxiety, memory loss, confusion. No homicidal or suicidal ideation.  Heme: Denies bruising, bleeding, and enlarged lymph nodes.  Physical Exam: BP (!) 145/68 (BP Location: Left Arm, Patient Position: Sitting, Cuff Size: Small)   Pulse 80   Temp 97.9 F (36.6 C) (Oral)   Ht 5' 7"  (1.702 m)   Wt 121 lb 12.8 oz (55.2 kg)   BMI 19.08 kg/m  General:   Alert and oriented. No distress noted. Pleasant and cooperative.  Head:  Normocephalic and atraumatic. Eyes:  Conjuctiva clear without scleral icterus. Mouth:  Oral mucosa pink and moist. Good dentition. No lesions. Heart: Normal rate and rhythm, s1 and s2 heart sounds present.  Lungs: Clear lung sounds in all lobes. Respirations equal and unlabored. Abdomen:  +BS, non-tender, distended but soft. No rebound or guarding. No HSM or masses noted. Derm: No palmar erythema or jaundice Msk:  Symmetrical without gross deformities. Normal  posture. Extremities:  Without edema. Neurologic:  Alert and  oriented x4 Psych:  Alert and cooperative. Normal mood and affect.  ASSESSMENT: Misty [name] is a 80 y.o. female presenting today for follow up of GERD, anemia and celiac disease.   GERD is moderately managed on twice daily omeprazole 24m. Still having to take tums frequently, but reports typically greasy or spicy foods are what give her indigestion. We will add Pepcid PRN nightly and continue PPI BID. Discussed importance of keeping reflux well managed given esophagitis on last EGD.  Hemoglobin has been stable, last was 10.9 in June 2022, followed closely by Dr. SLivia SnellenPCP, is currently taking iron supplements daily as well as folate and B12 given her hx of celiac disease. She denies any fatigue, dizziness, melena or hematochezia. Attempting to avoid gluten as much as possible, however she is not 100% compliant with gluten free diet despite previous counseling. She states there are some foods she just enjoys and she tries to eat them in moderation. She was counseled again today on importance of avoiding gluten as much as possible.   Suspect abdominal bloating is related to her intake of gluten as she reports regular BMs, denies pain and endorses frequent urination despite no longer taking her lasix.    PLAN:  Continue omeprazole 458mBID 2. Add pepcid PRN in the evenings as needed 3. Continue to avoid gluten as much as possible 4. Continue to avoid spicy/greasy foods 5. Stay sitting upright at least 1-2 hours after eating   Follow up in 6 months  Raygen Linquist L. CaAlver SorrowMSN, APRN, AGNP-C Adult-Gerontology Nurse Practitioner [facility]

## 2021-08-06 ENCOUNTER — Ambulatory Visit (INDEPENDENT_AMBULATORY_CARE_PROVIDER_SITE_OTHER): Payer: Medicare Other | Admitting: Family Medicine

## 2021-08-06 ENCOUNTER — Encounter: Payer: Self-pay | Admitting: Family Medicine

## 2021-08-06 ENCOUNTER — Other Ambulatory Visit: Payer: Self-pay

## 2021-08-06 VITALS — BP 139/74 | HR 70 | Temp 98.1°F | Ht 67.0 in | Wt 121.2 lb

## 2021-08-06 DIAGNOSIS — D500 Iron deficiency anemia secondary to blood loss (chronic): Secondary | ICD-10-CM | POA: Diagnosis not present

## 2021-08-06 DIAGNOSIS — E876 Hypokalemia: Secondary | ICD-10-CM

## 2021-08-06 DIAGNOSIS — E782 Mixed hyperlipidemia: Secondary | ICD-10-CM

## 2021-08-06 DIAGNOSIS — E559 Vitamin D deficiency, unspecified: Secondary | ICD-10-CM

## 2021-08-06 DIAGNOSIS — Z79899 Other long term (current) drug therapy: Secondary | ICD-10-CM

## 2021-08-06 MED ORDER — TRAZODONE HCL 150 MG PO TABS: ORAL_TABLET | ORAL | 3 refills | Status: AC

## 2021-08-06 MED ORDER — ALPRAZOLAM 0.5 MG PO TABS: 0.5000 mg | ORAL_TABLET | Freq: Two times a day (BID) | ORAL | 2 refills | Status: DC

## 2021-08-06 MED ORDER — PROAIR HFA 108 (90 BASE) MCG/ACT IN AERS: INHALATION_SPRAY | RESPIRATORY_TRACT | 1 refills | Status: DC

## 2021-08-06 NOTE — Progress Notes (Signed)
Subjective:  Patient ID: Misty [name], female    DOB: [birth year]  Age: 80 y.o. MRN: [id number]  CC: Medical Management of Chronic Issues   HPI Misty [name] presents for recheck of anxiety.Needs blowork for anemia. Also has had low potassium.  She  to have blood work for this.   Follow-up of hypertension. Patient has no history of headache chest pain or shortness of breath or recent cough. Patient also denies symptoms of TIA such as numbness weakness lateralizing. Patient checks  blood pressure at home and has not had any elevated readings recently. Patient denies side effects from his medication. States taking it regularly. Patient in for follow-up of GERD. Currently asymptomatic taking  PPI daily. There is no chest pain or heartburn. No hematemesis and no melena. No dysphagia or choking. Onset is remote. Progression is stable. Complicating factors, none. .  Depression screen [facility] 2/9 08/06/2021 08/06/2021 05/03/2021  Decreased Interest 0 0 0  Down, Depressed, Hopeless 1 0 0  PHQ - 2 Score 1 0 0  Altered sleeping 0 - 0  Tired, decreased energy 1 - 1  Change in appetite 1 - 0  Feeling bad or failure about yourself  0 - 0  Trouble concentrating 0 - 0  Moving slowly or fidgety/restless 0 - 0  Suicidal thoughts 0 - 0  PHQ-9 Score 3 - 1  Difficult doing work/chores Not difficult at all - Not difficult at all  Some recent data might be hidden   GAD 7 : Generalized Anxiety Score 05/03/2021 04/20/2020 01/10/2020 10/11/2019  Nervous, Anxious, on Edge _0 Control/stop worrying 1 0 2 1  Worry too much - different things _1 Trouble relaxing 1 0 2 1  Restless 0 0 0 0  Easily annoyed or irritable 0 0 1 0  Afraid - awful might happen 1 0 1 0  Total GAD 7 Score _2 Anxiety Difficulty Somewhat difficult Not difficult at all - -      History Misty [name] has a past medical history of Anxiety, Arthritis, Atrial fibrillation with RVR ([place]) (03/25/2019), Cataract, Chronic back pain,  Colitis, Constipation, COPD (chronic obstructive pulmonary disease) ([place]), Depression, Diverticulosis, Educated about COVID-19 virus infection (04/07/2019), Elevated troponin (03/25/2019), GERD (gastroesophageal reflux disease), H/O kyphoplasty (01/14/2019), Hematochezia (02/26/2015), Hemorrhoids, History of bronchitis, History of migraine, History of shingles, Hyperlipidemia, Hypokalemia (01/10/2019), Insomnia, Migraines, Nontraumatic compression fracture of T11 vertebra ([place]) (07/08/2014), Osteoporosis, and PONV (postoperative nausea and vomiting).   She has a past surgical history that includes Abdominal hysterectomy; Esophagogastroduodenoscopy; Colonoscopy; Lumbar laminectomy/decompression microdiscectomy (Bilateral, 05/20/2013); Eye surgery (Right, 01/2014); Kyphoplasty (N/A, 07/08/2014); Colonoscopy (N/A, 02/17/2015); Esophagogastroduodenoscopy (N/A, 01/29/2016); Esophagogastroduodenoscopy (N/A, 10/14/2017); Spine surgery; Intramedullary (im) nail intertrochanteric (Left, 01/07/2019); RIGHT/LEFT HEART CATH AND CORONARY ANGIOGRAPHY (N/A, 03/31/2019); left hip surgery; Colonoscopy (N/A, 07/12/2019); Esophagogastroduodenoscopy (N/A, 07/12/2019); biopsy (07/12/2019); Givens capsule study (N/A, 07/29/2019); Givens capsule study (N/A, 08/20/2019); Esophagogastroduodenoscopy (N/A, 10/03/2019); Givens capsule study (10/03/2019); Esophagogastroduodenoscopy (egd) with propofol (N/A, 10/31/2020); biopsy (10/31/2020); and Hot hemostasis (10/31/2020).   Her family history includes Cirrhosis in her son; Heart attack in her mother; Heart disease in her father and son; Hypertension in her mother; Macular degeneration in her father.She reports that she has been smoking cigarettes. She started smoking about 62 years ago. She has a 52.00 pack-year smoking history. She has never used smokeless tobacco. She reports that she does not drink alcohol and does not use drugs.    ROS Review of Systems  Constitutional: Negative.    HENT: Negative.    Eyes:  Negative for visual disturbance.  Respiratory:  Negative for shortness of breath.   Cardiovascular:  Negative for chest pain.  Gastrointestinal:  Negative for abdominal pain.  Musculoskeletal:  Negative for arthralgias.   Objective:  BP 139/74   Pulse 70   Temp 98.1 F (36.7 C)   Ht 5' 7" (1.702 m)   Wt 121 lb 3.2 oz (55 kg)   SpO2 94%   BMI 18.98 kg/m   BP Readings from Last 3 Encounters:  08/06/21 139/74  07/17/21 (!) 145/68  05/16/21 136/68    Wt Readings from Last 3 Encounters:  08/06/21 121 lb 3.2 oz (55 kg)  07/17/21 121 lb 12.8 oz (55.2 kg)  05/16/21 122 lb (55.3 kg)     Physical Exam Constitutional:      General: She is not in acute distress.    Appearance: She is well-developed.  Cardiovascular:     Rate and Rhythm: Normal rate and regular rhythm.  Pulmonary:     Breath sounds: Normal breath sounds.  Musculoskeletal:        General: Normal range of motion.  Skin:    General: Skin is warm and dry.  Neurological:     Mental Status: She is alert and oriented to person, place, and time.      Assessment & Plan:   Misty [name] was seen today for medical management of chronic issues.  Diagnoses and all orders for this visit:  Iron deficiency anemia due to chronic blood loss -     CBC with Differential/Platelet -     Iron, TIBC and Ferritin Panel  Mixed hyperlipidemia -     Lipid panel  Hypokalemia -     CMP14+EGFR  Vitamin D deficiency -     VITAMIN D 25 Hydroxy (Vit-D Deficiency, Fractures)  Controlled substance agreement signed -     Cancel: ToxASSURE Select 13 (MW), Urine -     Drug Screen 10 W/Conf, Se  Other orders -     ALPRAZolam (XANAX) 0.5 MG tablet; Take 1 tablet (0.5 mg total) by mouth 2 (two) times daily. -     traZODone (DESYREL) 150 MG tablet; Use from 1/3 to 1 tablet nightly as needed for sleep. -     PROAIR HFA 108 (90 Base) MCG/ACT inhaler; 2 puffs in lungs every 6 hours as needed wheezing/short of  breath (cough).      I am having Misty [name] "Misty [name]" maintain her cholecalciferol, acetaminophen, omeprazole, B complex-vitamin C-folic acid, calcium carbonate, bisoprolol, losartan, cyanocobalamin, ALPRAZolam, traZODone, and ProAir HFA.  Allergies as of 08/06/2021       Reactions   Codeine Nausea And Vomiting   Tramadol Nausea And Vomiting   Asa [aspirin] Other (See Comments)   Patient is unaware of allergy   Azithromycin Other (See Comments)   Patient is unaware of allergy   Celebrex [celecoxib] Other (See Comments)   Irritated stomach   Cymbalta [duloxetine Hcl] Other (See Comments)   Felt groggy   Prednisone Rash   She has seen the podiatrist and he had injected cortisone in her feet and she had also taken a peel at home. She had a severe reaction to her face with a rash and had to take Benadryl to resolve the symptom. She actually did get more cortisone shots, but no additional reactions to the shots.   Vioxx [rofecoxib] Other (See Comments)   Unknown   Zelnorm [tegaserod] Other (See  Comments)   Patient is unaware of allergy   Zocor [simvastatin] Other (See Comments)   Patient is unaware of allergy        Medication List        Accurate as of August 06, 2021  5:40 PM. If you have any questions, ask your nurse or doctor.          acetaminophen 500 MG tablet Commonly known as: TYLENOL Take 500 mg by mouth every 8 (eight) hours as needed for mild pain, moderate pain or headache.   ALPRAZolam 0.5 MG tablet Commonly known as: XANAX Take 1 tablet (0.5 mg total) by mouth 2 (two) times daily.   B complex-vitamin C-folic acid 1 MG tablet Take 1 tablet by mouth daily with breakfast.   bisoprolol 5 MG tablet Commonly known as: ZEBETA Take 1 tablet (5 mg total) by mouth daily.   calcium carbonate 500 MG chewable tablet Commonly known as: TUMS - dosed in mg elemental calcium Chew 2 tablets (400 mg of elemental calcium total) by mouth daily.    cholecalciferol 1000 units tablet Commonly known as: VITAMIN D Take 2,000 Units by mouth daily.   cyanocobalamin 1000 MCG tablet Take 1,000 mcg by mouth daily.   losartan 25 MG tablet Commonly known as: Cozaar Take 0.5 tablets (12.5 mg total) by mouth daily.   omeprazole 40 MG capsule Commonly known as: PRILOSEC Take 1 capsule (40 mg total) by mouth in the morning and at bedtime.   ProAir HFA 108 (90 Base) MCG/ACT inhaler Generic drug: albuterol 2 puffs in lungs every 6 hours as needed wheezing/short of breath (cough).   traZODone 150 MG tablet Commonly known as: DESYREL Use from 1/3 to 1 tablet nightly as needed for sleep.         Follow-up: Return in about 3 months (around 11/05/2021).  Claretta Fraise, M.D.

## 2021-08-07 LAB — CBC WITH DIFFERENTIAL/PLATELET
Basophils Absolute: 0 10*3/uL (ref 0.0–0.2)
Basos: 1 %
EOS (ABSOLUTE): 0.6 10*3/uL — ABNORMAL HIGH (ref 0.0–0.4)
Eos: 9 %
Hematocrit: 34.4 % (ref 34.0–46.6)
Hemoglobin: 10.2 g/dL — ABNORMAL LOW (ref 11.1–15.9)
Immature Grans (Abs): 0 10*3/uL (ref 0.0–0.1)
Immature Granulocytes: 0 %
Lymphocytes Absolute: 0.6 10*3/uL — ABNORMAL LOW (ref 0.7–3.1)
Lymphs: 9 %
MCH: 27.3 pg (ref 26.6–33.0)
MCHC: 29.7 g/dL — ABNORMAL LOW (ref 31.5–35.7)
MCV: 92 fL (ref 79–97)
Monocytes Absolute: 0.4 10*3/uL (ref 0.1–0.9)
Monocytes: 7 %
Neutrophils Absolute: 4.7 10*3/uL (ref 1.4–7.0)
Neutrophils: 74 %
Platelets: 280 10*3/uL (ref 150–450)
RBC: 3.74 x10E6/uL — ABNORMAL LOW (ref 3.77–5.28)
RDW: 15.1 % (ref 11.7–15.4)
WBC: 6.3 10*3/uL (ref 3.4–10.8)

## 2021-08-07 LAB — IRON,TIBC AND FERRITIN PANEL
Ferritin: 19 ng/mL (ref 15–150)
Iron Saturation: 8 % — CL (ref 15–55)
Iron: 26 ug/dL — ABNORMAL LOW (ref 27–139)
Total Iron Binding Capacity: 315 ug/dL (ref 250–450)
UIBC: 289 ug/dL (ref 118–369)

## 2021-08-07 LAB — CMP14+EGFR
ALT: 9 IU/L (ref 0–32)
AST: 15 IU/L (ref 0–40)
Albumin/Globulin Ratio: 1.3 (ref 1.2–2.2)
Albumin: 4 g/dL (ref 3.7–4.7)
Alkaline Phosphatase: 137 IU/L — ABNORMAL HIGH (ref 44–121)
BUN/Creatinine Ratio: 12 (ref 12–28)
BUN: 9 mg/dL (ref 8–27)
Bilirubin Total: 0.3 mg/dL (ref 0.0–1.2)
CO2: 29 mmol/L (ref 20–29)
Calcium: 9 mg/dL (ref 8.7–10.3)
Chloride: 101 mmol/L (ref 96–106)
Creatinine, Ser: 0.76 mg/dL (ref 0.57–1.00)
Globulin, Total: 3.1 g/dL (ref 1.5–4.5)
Glucose: 120 mg/dL — ABNORMAL HIGH (ref 65–99)
Potassium: 3.8 mmol/L (ref 3.5–5.2)
Sodium: 144 mmol/L (ref 134–144)
Total Protein: 7.1 g/dL (ref 6.0–8.5)
eGFR: 79 mL/min/{1.73_m2} (ref >59)

## 2021-08-07 LAB — LIPID PANEL
Chol/HDL Ratio: 2.8 ratio (ref 0.0–4.4)
Cholesterol, Total: 139 mg/dL (ref 100–199)
HDL: 49 mg/dL (ref >39)
LDL Chol Calc (NIH): 70 mg/dL (ref 0–99)
Triglycerides: 111 mg/dL (ref 0–149)
VLDL Cholesterol Cal: 20 mg/dL (ref 5–40)

## 2021-08-07 LAB — VITAMIN D 25 HYDROXY (VIT D DEFICIENCY, FRACTURES): Vit D, 25-Hydroxy: 48 ng/mL (ref 30.0–100.0)

## 2021-08-10 LAB — DRUG SCREEN 10 W/CONF, SERUM
Amphetamines, IA: NEGATIVE ng/mL
Barbiturates, IA: NEGATIVE ug/mL
Benzodiazepines, IA: NEGATIVE ng/mL
Cocaine & Metabolite, IA: NEGATIVE ng/mL
Methadone, IA: NEGATIVE ng/mL
Opiates, IA: NEGATIVE ng/mL
Oxycodones, IA: NEGATIVE ng/mL
Phencyclidine, IA: NEGATIVE ng/mL
Propoxyphene, IA: NEGATIVE ng/mL
THC(Marijuana) Metabolite, IA: NEGATIVE ng/mL

## 2021-08-13 ENCOUNTER — Ambulatory Visit (INDEPENDENT_AMBULATORY_CARE_PROVIDER_SITE_OTHER): Payer: Medicare Other

## 2021-08-13 VITALS — Ht 67.0 in | Wt 121.0 lb

## 2021-08-13 DIAGNOSIS — Z0000 Encounter for general adult medical examination without abnormal findings: Secondary | ICD-10-CM

## 2021-08-13 NOTE — Progress Notes (Signed)
Subjective:   Misty [name] is a 80 y.o. female who presents for Medicare Annual (Subsequent) preventive examination.  Virtual Visit via Telephone Note  I connected with  Misty [name] on 08/13/21 at  2:45 PM EDT by telephone and verified that I am speaking with the correct person using two identifiers.  Location: Patient: Home Provider: [facility] Persons participating in the virtual visit: patient/Nurse Health Advisor   I discussed the limitations, risks, security and privacy concerns of performing an evaluation and management service by telephone and the availability of in person appointments. The patient expressed understanding and agreed to proceed.  Interactive audio and video telecommunications were attempted between this nurse and patient, however failed, due to patient having technical difficulties OR patient did not have access to video capability.  We continued and completed visit with audio only.  Some vital signs may be absent or patient reported.   Misty [name] Misty Isatu Macinnes, LPN   Review of Systems     Cardiac Risk Factors include: advanced age (>31mn, >>73women);dyslipidemia;hypertension;sedentary lifestyle;smoking/ tobacco exposure;Other (see comment), Risk factor comments: A.Fib, anemia, CHF, atherosclerosis     Objective:    Today's Vitals   08/13/21 1455  Weight: 121 lb (54.9 kg)  Height: 5' 7"  (1.702 m)   Body mass index is 18.95 kg/m.  Advanced Directives 08/13/2021 11/01/2020 10/31/2020 10/30/2020 10/03/2019 10/03/2019 10/02/2019  Does Patient Have a Medical Advance Directive? Yes - Yes Yes No No -  Type of AParamedicof [place] will;Out of facility DNR (pink MOST or yellow form) Healthcare Power of [place] Does patient want to make changes to medical advance directive? - - - - - No - Patient declined -  Copy of HO[place]in Chart? Yes -  validated most recent copy scanned in chart (See row information) No - copy requested - - - - -  Would patient like information on creating a medical advance directive? - - - - No - Patient declined No - Patient declined No - Patient declined  Pre-existing out of facility DNR order (yellow form or pink MOST form) - - - - - - -    Current Medications (verified) Outpatient Encounter Medications as of 08/13/2021  Medication Sig   acetaminophen (TYLENOL) 500 MG tablet Take 500 mg by mouth every 8 (eight) hours as needed for mild pain, moderate pain or headache.    ALPRAZolam (XANAX) 0.5 MG tablet Take 1 tablet (0.5 mg total) by mouth 2 (two) times daily.   B Complex-C-Folic Acid (B COMPLEX-VITAMIN C-FOLIC ACID) 1 MG tablet Take 1 tablet by mouth daily with breakfast.   bisoprolol (ZEBETA) 5 MG tablet Take 1 tablet (5 mg total) by mouth daily.   calcium carbonate (TUMS - DOSED IN MG ELEMENTAL CALCIUM) 500 MG chewable tablet Chew 2 tablets (400 mg of elemental calcium total) by mouth daily.   cholecalciferol (VITAMIN D) 1000 UNITS tablet Take 2,000 Units by mouth daily.    cyanocobalamin 1000 MCG tablet Take 1,000 mcg by mouth daily.   losartan (COZAAR) 25 MG tablet Take 0.5 tablets (12.5 mg total) by mouth daily.   omeprazole (PRILOSEC) 40 MG capsule Take 1 capsule (40 mg total) by mouth in the morning and at bedtime.   PROAIR HFA 108 (90 Base) MCG/ACT inhaler 2 puffs in lungs every 6 hours as needed wheezing/short of breath (cough).   traZODone (DESYREL) 150 MG tablet Use  from 1/3 to 1 tablet nightly as needed for sleep.   No facility-administered encounter medications on file as of 08/13/2021.    Allergies (verified) Codeine, Tramadol, Asa [aspirin], Azithromycin, Celebrex [celecoxib], Cymbalta [duloxetine hcl], Prednisone, Vioxx [rofecoxib], Zelnorm [tegaserod], and Zocor [simvastatin]   History: Past Medical History:  Diagnosis Date   Anxiety    takes Xanax daily   Arthritis    back    Atrial fibrillation with RVR ([place]) 03/25/2019   Cataract    Chronic back pain    Colitis    Constipation    OTC stool softener prn   COPD (chronic obstructive pulmonary disease) ([place])    Depression    Diverticulosis    Educated about COVID-19 virus infection 04/07/2019   Elevated troponin 03/25/2019   GERD (gastroesophageal reflux disease)    takes Ranidine daily   H/O kyphoplasty 01/14/2019   Hematochezia 02/26/2015   Hemorrhoids    History of bronchitis    History of migraine    many yrs ago   History of shingles    Hyperlipidemia    Hypokalemia 01/10/2019   Insomnia    Migraines    Nontraumatic compression fracture of T11 vertebra ([place]) 07/08/2014   Osteoporosis    PONV (postoperative nausea and vomiting)    Past Surgical History:  Procedure Laterality Date   ABDOMINAL HYSTERECTOMY     BIOPSY  07/12/2019   Procedure: BIOPSY;  Surgeon: Rogene Houston, MD;  [facility];  Service: Endoscopy;;  duodenum   BIOPSY  10/31/2020   Procedure: BIOPSY;  Surgeon: Harvel Quale, MD;  [facility];  Service: Gastroenterology;;   COLONOSCOPY     COLONOSCOPY N/A 02/17/2015   Procedure: COLONOSCOPY;  Surgeon: Rogene Houston, MD;  [facility];  Service: Endoscopy;  Laterality: N/A;  110n - moved to 11:15 - Ann to notify pt   COLONOSCOPY N/A 07/12/2019   rehman: - Diverticulosis in the sigmoid colon and in the descending colon., external and internal hemorrhoids, no specimens collected   ESOPHAGOGASTRODUODENOSCOPY     ESOPHAGOGASTRODUODENOSCOPY N/A 01/29/2016   Procedure: ESOPHAGOGASTRODUODENOSCOPY (EGD);  Surgeon: Rogene Houston, MD;  [facility];  Service: Endoscopy;  Laterality: N/A;   ESOPHAGOGASTRODUODENOSCOPY N/A 10/14/2017   Procedure: ESOPHAGOGASTRODUODENOSCOPY (EGD);  Surgeon: Rogene Houston, MD;  [facility];  Service: Endoscopy;  Laterality: N/A;  730   ESOPHAGOGASTRODUODENOSCOPY N/A 07/12/2019   Procedure:  ESOPHAGOGASTRODUODENOSCOPY (EGD);  Surgeon: Rogene Houston, MD;  [facility];  Service: Endoscopy;  Laterality: N/A;   ESOPHAGOGASTRODUODENOSCOPY N/A 10/03/2019   Procedure: ESOPHAGOGASTRODUODENOSCOPY (EGD);  Surgeon: Rogene Houston, MD;  [facility];  Service: Endoscopy;  Laterality: N/A;   ESOPHAGOGASTRODUODENOSCOPY (EGD) WITH PROPOFOL N/A 10/31/2020   Rehman: - LA Grade D esophagitis with no bleeding. 2cm HH, two polyps below GE junction, small amount of resude in stomach,gastric antral vascular ectasia w/o bleeding, treated with APC. scalloped mucosa found in duodenum, consistent with celiac disease   EYE SURGERY Right 01/2014   cataracts   GIVENS CAPSULE STUDY N/A 07/29/2019   Procedure: GIVENS CAPSULE STUDY;  Surgeon: Rogene Houston, MD;  [facility];  Service: Endoscopy;  Laterality: N/A;   GIVENS CAPSULE STUDY N/A 08/20/2019   Procedure: GIVENS CAPSULE STUDY;  Surgeon: Rogene Houston, MD;  [facility];  Service: Endoscopy;  Laterality: N/A;  [place] STUDY  10/03/2019   Procedure: GIVENS CAPSULE STUDY;  Surgeon:  Rogene Houston, MD;  [facility];  Service: Endoscopy;;   HOT HEMOSTASIS  10/31/2020   Procedure: HOT HEMOSTASIS (ARGON PLASMA COAGULATION/BICAP);  Surgeon: Montez Morita, Quillian Quince, MD;  [facility];  Service: Gastroenterology;;   INTRAMEDULLARY (IM) NAIL INTERTROCHANTERIC Left 01/07/2019   Procedure: INTRAMEDULLARY (IM) NAIL INTERTROCHANTRIC;  Surgeon: Carole Civil, MD;  [facility]: [facility];  Service: Orthopedics;  Laterality: Left;   KYPHOPLASTY N/A 07/08/2014   Procedure: Thoracic Eleven Kyphoplasty;  Surgeon: Hosie Spangle, MD;  Location: [place] [facility];  Service: Neurosurgery;  Laterality: N/A;   left hip surgery     LUMBAR LAMINECTOMY/DECOMPRESSION MICRODISCECTOMY Bilateral 05/20/2013   Procedure: LUMBAR LAMINECTOMY/DECOMPRESSION MICRODISCECTOMY 1 LEVEL;  Surgeon: Hosie Spangle, MD;  Location: [place] [facility];  Service: Neurosurgery;  Laterality: Bilateral;  Bilateral Lumbar four-five laminotomy and left lumbar four-five microdiskectomy   RIGHT/LEFT HEART CATH AND CORONARY ANGIOGRAPHY N/A 03/31/2019   Procedure: RIGHT/LEFT HEART CATH AND CORONARY ANGIOGRAPHY;  Surgeon: Sherren Mocha, MD;  Location: [place] [facility];  Service: Cardiovascular;  Laterality: N/A;   SPINE SURGERY     Dr Carloyn Manner -  vertebraplasty   Family History  Problem Relation Age of Onset   Hypertension Mother    Heart attack Mother    Macular degeneration Father    Heart disease Father    Cirrhosis Son    Heart disease Son    Colon cancer Neg Hx    Social History   Socioeconomic History   Marital status: Married    Spouse name: Sterling Big    Number of children: 1   Years of education: Not on file   Highest education level: Not on file  Occupational History   Occupation: retired     Comment: [employer] work - came out in [place] Use   Smoking status: Every Day    Packs/day: 1.00    Years: 52.00    Pack years: 52.00    Types: Cigarettes    Start date: 11/18/1958    Last attempt to quit: 08/20/2011    Years since quitting: 9.9   Smokeless tobacco: Never   Tobacco comments:    1/2-1pack off and on all her life  Vaping Use   Vaping Use: Never used  Substance and Sexual Activity   Alcohol use: No    Alcohol/week: 0.0 standard drinks   Drug use: No   Sexual activity: Not Currently  Other Topics Concern   Not on file  Social History Narrative   Lives at home with husband, Sterling Big. Had one son- now deceased    1 grandsons and 4 great grandsons - they help her a lot   Daughter in Sports coach helps a lot as well   Social Determinants of Radio broadcast assistant Strain: Low Risk    Difficulty of Paying Living Expenses: Not hard at all  Food Insecurity: No Food Insecurity   Worried About Charity fundraiser in the Last Year: Never true   Arboriculturist in the Last Year: Never true   Transportation Needs: No Transportation Needs   Lack of Transportation (Medical): No   Lack of Transportation (Non-Medical): No  Physical Activity: Inactive   Days of Exercise per Week: 0 days   Minutes of Exercise per Session: 0 min  Stress: No Stress Concern Present   Feeling of Stress : Only a little  Social Connections: Moderately Isolated   Frequency of Communication with Friends and Family: More than  three times a week   Frequency of Social Gatherings with Friends and Family: Twice a week   Attends Religious Services: Never   Marine scientist or Organizations: No   Attends Music therapist: Never   Marital Status: Married    Tobacco Counseling Ready to quit: Not Answered Counseling given: Not Answered Tobacco comments: 1/2-1pack off and on all her life   Clinical Intake:  Pre-visit preparation completed: Yes  Pain : No/denies pain     BMI - recorded: 18.95 Nutritional Status: BMI <19  Underweight Nutritional Risks: None Diabetes: No  How often do you need to have someone help you when you read instructions, pamphlets, or other written materials from your doctor or pharmacy?: 1 - Never  Diabetic? No  Interpreter Needed?: No  Information entered by :: Wilton Thrall, LPN   Activities of Daily Living In your present state of health, do you have any difficulty performing the following activities: 08/13/2021 11/01/2020  Hearing? N N  Vision? N N  Difficulty concentrating or making decisions? N N  Walking or climbing stairs? Y N  Dressing or bathing? N N  Doing errands, shopping? Tempie Donning  Preparing Food and eating ? Y -  Comment she cannot stand long enough to cook - daughter in Sports coach and grandsons bring food and help out -  Using the Toilet? N -  In the past six months, have you accidently leaked urine? Y -  Comment wears depends, but usually makes it -  Do you have problems with loss of bowel control? Y -  Managing your Medications? N -   Managing your Finances? N -  Housekeeping or managing your Housekeeping? Y -  Some recent data might be hidden    Patient Care Team: Claretta Fraise, MD as PCP - General (Family Medicine) Minus Breeding, MD as PCP - Cardiology (Cardiology) Rogene Houston, MD as Consulting Physician (Gastroenterology) Glenna Fellows, MD as Attending Physician (Neurosurgery) Alanda Slim Neena Rhymes, MD as Consulting Physician (Ophthalmology)  Indicate any recent Medical Services you may have received from other than [facility] providers in the past year (date may be approximate).     Assessment:   This is a routine wellness examination for Menan.  Hearing/Vision screen Hearing Screening - Comments:: Denies hearing difficulties  Vision Screening - Comments:: Wears rx glasses prn - behind on annual eye exams  Dietary issues and exercise activities discussed: Current Exercise Habits: The patient does not participate in regular exercise at present, Type of exercise: walking, Time (Minutes): 10, Frequency (Times/Week): 5, Weekly Exercise (Minutes/Week): 50, Intensity: Mild, Exercise limited by: orthopedic condition(s);respiratory conditions(s);cardiac condition(s)   Goals Addressed             This Visit's Progress    Increase physical activity   Not on track    Prevent falls   On track      Depression Screen PHQ 2/9 Scores 08/13/2021 08/06/2021 08/06/2021 05/03/2021 05/03/2021 01/25/2021 01/25/2021  PHQ - 2 Score 1 1 0 0 0 0 0  PHQ- 9 Score 3 3 - 1 - - -    Fall Risk Fall Risk  08/13/2021 08/06/2021 05/03/2021 01/25/2021 10/26/2020  Falls in the past year? 0 0 0 0 0  Number falls in past yr: 0 - - - 0  Injury with Fall? 0 - - - 0  Comment - - - - -  Risk for fall due to : Impaired balance/gait;Orthopedic patient;Medication side effect - - - No Fall Risks  Follow up Education provided;Falls prevention discussed - - - Falls evaluation completed    FALL RISK PREVENTION PERTAINING TO THE HOME:  Any stairs in  or around the home? No  If so, are there any without handrails? No  Home free of loose throw rugs in walkways, pet beds, electrical cords, etc? Yes  Adequate lighting in your home to reduce risk of falls? Yes   ASSISTIVE DEVICES UTILIZED TO PREVENT FALLS:  Life alert? No  Use of a cane, walker or w/c? Yes  Grab bars in the bathroom? Yes  Shower chair or bench in shower? Yes  Elevated toilet seat or a handicapped toilet? Yes   TIMED UP AND GO:  Was the test performed? No . Telephonic visit  Cognitive Function: Normal cognitive status assessed by direct observation by this Nurse Health Advisor. No abnormalities found.   MMSE - Mini Mental State Exam 03/16/2018  Orientation to time 5  Orientation to Place 5  Registration 3  Attention/ Calculation 5  Recall 3  Language- name 2 objects 2  Language- repeat 1  Language- follow 3 step command 3  Language- read & follow direction 1  Write a sentence 1  Copy design 1  Total score 30        Immunizations Immunization History  Administered Date(s) Administered   Influenza, High Dose Seasonal PF 10/03/2016, 09/03/2017   Influenza,inj,Quad PF,6+ Mos 08/19/2013, 09/20/2015   Influenza,inj,quad, With Preservative 09/07/2014   Influenza-Unspecified 09/17/2018   Pneumococcal Conjugate-13 12/16/2013   Pneumococcal Polysaccharide-23 02/08/2010   Tdap 07/18/2011    TDAP status: Due, Education has been provided regarding the importance of this vaccine. Advised may receive this vaccine at local pharmacy or Health Dept. Aware to provide a copy of the vaccination record if obtained from local pharmacy or Health Dept. Verbalized acceptance and understanding.  Flu Vaccine status: Due, Education has been provided regarding the importance of this vaccine. Advised may receive this vaccine at local pharmacy or Health Dept. Aware to provide a copy of the vaccination record if obtained from local pharmacy or Health Dept. Verbalized acceptance and  understanding.  Pneumococcal vaccine status: Up to date  Covid-19 vaccine status: Declined, Education has been provided regarding the importance of this vaccine but patient still declined. Advised may receive this vaccine at local pharmacy or Health Dept.or vaccine clinic. Aware to provide a copy of the vaccination record if obtained from local pharmacy or Health Dept. Verbalized acceptance and understanding.  Qualifies for Shingles Vaccine? Yes   Zostavax completed No   Shingrix Completed?: No.    Education has been provided regarding the importance of this vaccine. Patient has been advised to call insurance company to determine out of pocket expense if they have not yet received this vaccine. Advised may also receive vaccine at local pharmacy or Health Dept. Verbalized acceptance and understanding.  Screening Tests Health Maintenance  Topic Date Due   COVID-19 Vaccine (1) 08/29/2021 (Originally 10/10/1941)   INFLUENZA VACCINE  10/18/2021 (Originally 06/18/2021)   TETANUS/TDAP  10/18/2021 (Originally 07/17/2021)   Zoster Vaccines- Shingrix (1 of 2) 10/18/2021 (Originally 04/09/1960)   DEXA SCAN  Completed   HPV VACCINES  Aged Out   MAMMOGRAM  Discontinued   PAP SMEAR-Modifier  Discontinued    Health Maintenance  There are no preventive care reminders to display for this patient.   Colorectal cancer screening: No longer required.   Mammogram status: No longer required due to age.  Bone Density declined  Lung Cancer Screening: (Low Dose  CT Chest recommended if Age 36-80 years, 30 pack-year currently smoking OR have quit w/in 15years.) does not qualify  Additional Screening:  Hepatitis C Screening: does not qualify  Vision Screening: Recommended annual ophthalmology exams for early detection of glaucoma and other disorders of the eye. Is the patient up to date with their annual eye exam?  No  Who is the provider or what is the name of the office in which the patient attends annual  eye exams? none If pt is not established with a provider, would they like to be referred to a provider to establish care? No .   Dental Screening: Recommended annual dental exams for proper oral hygiene  Community Resource Referral / Chronic Care Management: CRR required this visit?  No   CCM required this visit?  No      Plan:     I have personally reviewed and noted the following in the patient's chart:   Medical and social history Use of alcohol, tobacco or illicit drugs  Current medications and supplements including opioid prescriptions.  Functional ability and status Nutritional status Physical activity Advanced directives List of other physicians Hospitalizations, surgeries, and ER visits in previous 12 months Vitals Screenings to include cognitive, depression, and falls Referrals and appointments  In addition, I have reviewed and discussed with patient certain preventive protocols, quality metrics, and best practice recommendations. A written personalized care plan for preventive services as well as general preventive health recommendations were provided to patient.     Sandrea Hammond, LPN   9/70/[id number]   Nurse Notes: None

## 2021-08-13 NOTE — Patient Instructions (Signed)
Misty [name] , Thank you for taking time to come for your Medicare Wellness Visit. I appreciate your ongoing commitment to your health goals. Please review the following plan we discussed and let me know if I can assist you in the future.   Screening recommendations/referrals: Colonoscopy: Done 07/12/2019 - no repeat required Mammogram: Done 11/24/2017 - repeat not required Bone Density: Done 04/07/2013 - recommend repeat every 2 years -  not required Recommended yearly ophthalmology/optometry visit for glaucoma screening and checkup Recommended yearly dental visit for hygiene and checkup  Vaccinations: Influenza vaccine: Due every fall Pneumococcal vaccine: Done 02/08/2010 & 12/16/2013 Tdap vaccine: Done 07/18/2011 - Repeat in 10 years *due Shingles vaccine: Due. Shingrix discussed. Please contact your pharmacy for coverage information.    Covid-19:Declined  Advanced directives: Please bring a copy of your health care power of attorney and living will to the office to be added to your chart at your convenience.   Conditions/risks identified: Aim for 30 minutes of exercise or brisk walking each day, drink 6-8 glasses of water and eat lots of fruits and vegetables.   Next appointment: Follow up in one year for your annual wellness visit    Preventive Care 65 Years and Older, Female Preventive care refers to lifestyle choices and visits with your health care provider that can promote health and wellness. What does preventive care include? A yearly physical exam. This is also called an annual well check. Dental exams once or twice a year. Routine eye exams. Ask your health care provider how often you should have your eyes checked. Personal lifestyle choices, including: Daily care of your teeth and gums. Regular physical activity. Eating a healthy diet. Avoiding tobacco and drug use. Limiting alcohol use. Practicing safe sex. Taking low-dose aspirin every day. Taking vitamin and mineral  supplements as recommended by your health care provider. What happens during an annual well check? The services and screenings done by your health care provider during your annual well check will depend on your age, overall health, lifestyle risk factors, and family history of disease. Counseling  Your health care provider may ask you questions about your: Alcohol use. Tobacco use. Drug use. Emotional well-being. Home and relationship well-being. Sexual activity. Eating habits. History of falls. Memory and ability to understand (cognition). Work and work Statistician. Reproductive health. Screening  You may have the following tests or measurements: Height, weight, and BMI. Blood pressure. Lipid and cholesterol levels. These may be checked every 5 years, or more frequently if you are over 63 years old. Skin check. Lung cancer screening. You may have this screening every year starting at age 27 if you have a 30-pack-year history of smoking and currently smoke or have quit within the past 15 years. Fecal occult blood test (FOBT) of the stool. You may have this test every year starting at age 109. Flexible sigmoidoscopy or colonoscopy. You may have a sigmoidoscopy every 5 years or a colonoscopy every 10 years starting at age 13. Hepatitis C blood test. Hepatitis B blood test. Sexually transmitted disease (STD) testing. Diabetes screening. This is done by checking your blood sugar (glucose) after you have not eaten for a while (fasting). You may have this done every 1-3 years. Bone density scan. This is done to screen for osteoporosis. You may have this done starting at age 42. Mammogram. This may be done every 1-2 years. Talk to your health care provider about how often you should have regular mammograms. Talk with your health care provider about your  test results, treatment options, and if necessary, the need for more tests. Vaccines  Your health care provider may recommend certain  vaccines, such as: Influenza vaccine. This is recommended every year. Tetanus, diphtheria, and acellular pertussis (Tdap, Td) vaccine. You may need a Td booster every 10 years. Zoster vaccine. You may need this after age 64. Pneumococcal 13-valent conjugate (PCV13) vaccine. One dose is recommended after age 20. Pneumococcal polysaccharide (PPSV23) vaccine. One dose is recommended after age 34. Talk to your health care provider about which screenings and vaccines you need and how often you need them. This information is not intended to replace advice given to you by your health care provider. Make sure you discuss any questions you have with your health care provider. Document Released: 12/01/2015 Document Revised: 07/24/2016 Document Reviewed: 09/05/2015 Elsevier Interactive Patient Education  2017 [place] Prevention in the Home Falls can cause injuries. They can happen to people of all ages. There are many things you can do to make your home safe and to help prevent falls. What can I do on the outside of my home? Regularly fix the edges of walkways and driveways and fix any cracks. Remove anything that might make you trip as you walk through a door, such as a raised step or threshold. Trim any bushes or trees on the path to your home. Use bright outdoor lighting. Clear any walking paths of anything that might make someone trip, such as rocks or tools. Regularly check to see if handrails are loose or broken. Make sure that both sides of any steps have handrails. Any raised decks and porches should have guardrails on the edges. Have any leaves, snow, or ice cleared regularly. Use sand or salt on walking paths during winter. Clean up any spills in your garage right away. This includes oil or grease spills. What can I do in the bathroom? Use night lights. Install grab bars by the toilet and in the tub and shower. Do not use towel bars as grab bars. Use non-skid mats or decals in  the tub or shower. If you need to sit down in the shower, use a plastic, non-slip stool. Keep the floor dry. Clean up any water that spills on the floor as soon as it happens. Remove soap buildup in the tub or shower regularly. Attach bath mats securely with double-sided non-slip rug tape. Do not have throw rugs and other things on the floor that can make you trip. What can I do in the bedroom? Use night lights. Make sure that you have a light by your bed that is easy to reach. Do not use any sheets or blankets that are too big for your bed. They should not hang down onto the floor. Have a firm chair that has side arms. You can use this for support while you get dressed. Do not have throw rugs and other things on the floor that can make you trip. What can I do in the kitchen? Clean up any spills right away. Avoid walking on wet floors. Keep items that you use a lot in easy-to-reach places. If you need to reach something above you, use a strong step stool that has a grab bar. Keep electrical cords out of the way. Do not use floor polish or wax that makes floors slippery. If you must use wax, use non-skid floor wax. Do not have throw rugs and other things on the floor that can make you trip. What can I do with my  stairs? Do not leave any items on the stairs. Make sure that there are handrails on both sides of the stairs and use them. Fix handrails that are broken or loose. Make sure that handrails are as long as the stairways. Check any carpeting to make sure that it is firmly attached to the stairs. Fix any carpet that is loose or worn. Avoid having throw rugs at the top or bottom of the stairs. If you do have throw rugs, attach them to the floor with carpet tape. Make sure that you have a light switch at the top of the stairs and the bottom of the stairs. If you do not have them, ask someone to add them for you. What else can I do to help prevent falls? Wear shoes that: Do not have high  heels. Have rubber bottoms. Are comfortable and fit you well. Are closed at the toe. Do not wear sandals. If you use a stepladder: Make sure that it is fully opened. Do not climb a closed stepladder. Make sure that both sides of the stepladder are locked into place. Ask someone to hold it for you, if possible. Clearly mark and make sure that you can see: Any grab bars or handrails. First and last steps. Where the edge of each step is. Use tools that help you move around (mobility aids) if they are needed. These include: Canes. Walkers. Scooters. Crutches. Turn on the lights when you go into a dark area. Replace any light bulbs as soon as they burn out. Set up your furniture so you have a clear path. Avoid moving your furniture around. If any of your floors are uneven, fix them. If there are any pets around you, be aware of where they are. Review your medicines with your doctor. Some medicines can make you feel dizzy. This can increase your chance of falling. Ask your doctor what other things that you can do to help prevent falls. This information is not intended to replace advice given to you by your health care provider. Make sure you discuss any questions you have with your health care provider. Document Released: 08/31/2009 Document Revised: 04/11/2016 Document Reviewed: 12/09/2014 Elsevier Interactive Patient Education  2017 [employer].

## 2021-08-21 NOTE — Progress Notes (Signed)
Cardiology Office Note   Date:  08/22/2021   ID:  Kharma, [place] Feb 16, 1941, MRN [id number]  PCP:  Claretta Fraise, MD  Cardiologist:   Minus Breeding, MD Referring:  Claretta Fraise, MD  Chief Complaint  Patient presents with   Atrial Fibrillation        History of Present Illness: Misty [name] is a 80 y.o. female who presents for follow-up of atrial fib , cardiomyopathy and GI bleed.   She has mild nonobstructive coronary disease.  This was when she was in the hospital with respiratory failure.  She had pleural effusions. We have been managing her reduced ejection fraction medically.  She was in the hospital in August 2021 with anemia.   She has had an extensive work up and re challenges with DOAC as previously outlined and ultimately was taken off.  Her last hospitalization for anemia was Dec 2021.    Since I last saw her she is done relatively well.  She just is weak.  She gets around home with a walker.  She is frustrated by her husband who is deaf and has dementia.  She denies any acute symptoms such as presyncope or syncope.  She is not having any palpitations.  She has no new shortness of breath, PND or orthopnea.  He had no further evidence of GI bleeding.   Past Medical History:  Diagnosis Date   Anxiety    takes Xanax daily   Arthritis    back   Atrial fibrillation with RVR (HCC) 03/25/2019   Cataract    Chronic back pain    Colitis    Constipation    OTC stool softener prn   COPD (chronic obstructive pulmonary disease) ([place])    Depression    Diverticulosis    Educated about COVID-19 virus infection 04/07/2019   Elevated troponin 03/25/2019   GERD (gastroesophageal reflux disease)    takes Ranidine daily   H/O kyphoplasty 01/14/2019   Hematochezia 02/26/2015   Hemorrhoids    History of bronchitis    History of migraine    many yrs ago   History of shingles    Hyperlipidemia    Hypokalemia 01/10/2019   Insomnia    Migraines    Nontraumatic  compression fracture of T11 vertebra ([place]) 07/08/2014   Osteoporosis    PONV (postoperative nausea and vomiting)     Past Surgical History:  Procedure Laterality Date   ABDOMINAL HYSTERECTOMY     BIOPSY  07/12/2019   Procedure: BIOPSY;  Surgeon: Rogene Houston, MD;  [facility];  Service: Endoscopy;;  duodenum   BIOPSY  10/31/2020   Procedure: BIOPSY;  Surgeon: Harvel Quale, MD;  [facility];  Service: Gastroenterology;;   COLONOSCOPY     COLONOSCOPY N/A 02/17/2015   Procedure: COLONOSCOPY;  Surgeon: Rogene Houston, MD;  [facility];  Service: Endoscopy;  Laterality: N/A;  110n - moved to 11:15 - Ann to notify pt   COLONOSCOPY N/A 07/12/2019   rehman: - Diverticulosis in the sigmoid colon and in the descending colon., external and internal hemorrhoids, no specimens collected   ESOPHAGOGASTRODUODENOSCOPY     ESOPHAGOGASTRODUODENOSCOPY N/A 01/29/2016   Procedure: ESOPHAGOGASTRODUODENOSCOPY (EGD);  Surgeon: Rogene Houston, MD;  [facility];  Service: Endoscopy;  Laterality: N/A;   ESOPHAGOGASTRODUODENOSCOPY N/A 10/14/2017   Procedure: ESOPHAGOGASTRODUODENOSCOPY (EGD);  Surgeon: Rogene Houston, MD;  [facility];  Service: Endoscopy;  Laterality: N/A;  730  ESOPHAGOGASTRODUODENOSCOPY N/A 07/12/2019   Procedure: ESOPHAGOGASTRODUODENOSCOPY (EGD);  Surgeon: Rogene Houston, MD;  [facility];  Service: Endoscopy;  Laterality: N/A;   ESOPHAGOGASTRODUODENOSCOPY N/A 10/03/2019   Procedure: ESOPHAGOGASTRODUODENOSCOPY (EGD);  Surgeon: Rogene Houston, MD;  [facility];  Service: Endoscopy;  Laterality: N/A;   ESOPHAGOGASTRODUODENOSCOPY (EGD) WITH PROPOFOL N/A 10/31/2020   Rehman: - LA Grade D esophagitis with no bleeding. 2cm HH, two polyps below GE junction, small amount of resude in stomach,gastric antral vascular ectasia w/o bleeding, treated with APC. scalloped mucosa found in duodenum,  consistent with celiac disease   EYE SURGERY Right 01/2014   cataracts   GIVENS CAPSULE STUDY N/A 07/29/2019   Procedure: GIVENS CAPSULE STUDY;  Surgeon: Rogene Houston, MD;  [facility];  Service: Endoscopy;  Laterality: N/A;   GIVENS CAPSULE STUDY N/A 08/20/2019   Procedure: GIVENS CAPSULE STUDY;  Surgeon: Rogene Houston, MD;  [facility];  Service: Endoscopy;  Laterality: N/A;  Nowthen STUDY  10/03/2019   Procedure: GIVENS CAPSULE STUDY;  Surgeon: Rogene Houston, MD;  [facility];  Service: Endoscopy;;   HOT HEMOSTASIS  10/31/2020   Procedure: HOT HEMOSTASIS (ARGON PLASMA COAGULATION/BICAP);  Surgeon: Montez Morita, Quillian Quince, MD;  [facility];  Service: Gastroenterology;;   INTRAMEDULLARY (IM) NAIL INTERTROCHANTERIC Left 01/07/2019   Procedure: INTRAMEDULLARY (IM) NAIL INTERTROCHANTRIC;  Surgeon: Carole Civil, MD;  Location: [facility];  Service: Orthopedics;  Laterality: Left;   KYPHOPLASTY N/A 07/08/2014   Procedure: Thoracic Eleven Kyphoplasty;  Surgeon: Hosie Spangle, MD;  Location: [place] [facility];  Service: Neurosurgery;  Laterality: N/A;   left hip surgery     LUMBAR LAMINECTOMY/DECOMPRESSION MICRODISCECTOMY Bilateral 05/20/2013   Procedure: LUMBAR LAMINECTOMY/DECOMPRESSION MICRODISCECTOMY 1 LEVEL;  Surgeon: Hosie Spangle, MD;  Location: [place] [facility];  Service: Neurosurgery;  Laterality: Bilateral;  Bilateral Lumbar four-five laminotomy and left lumbar four-five microdiskectomy   RIGHT/LEFT HEART CATH AND CORONARY ANGIOGRAPHY N/A 03/31/2019   Procedure: RIGHT/LEFT HEART CATH AND CORONARY ANGIOGRAPHY;  Surgeon: Sherren Mocha, MD;  Location: [place] [facility];  Service: Cardiovascular;  Laterality: N/A;   SPINE SURGERY     Dr Carloyn Manner -  vertebraplasty     Current Outpatient Medications  Medication Sig Dispense Refill   acetaminophen (TYLENOL) 500 MG tablet Take 500 mg by mouth every 8 (eight) hours as  needed for mild pain, moderate pain or headache.      ALPRAZolam (XANAX) 0.5 MG tablet Take 1 tablet (0.5 mg total) by mouth 2 (two) times daily. 60 tablet 2   B Complex-C-Folic Acid (B COMPLEX-VITAMIN C-FOLIC ACID) 1 MG tablet Take 1 tablet by mouth daily with breakfast.     bisoprolol (ZEBETA) 5 MG tablet Take 1 tablet (5 mg total) by mouth daily. 90 tablet 3   calcium carbonate (TUMS - DOSED IN MG ELEMENTAL CALCIUM) 500 MG chewable tablet Chew 2 tablets (400 mg of elemental calcium total) by mouth daily.     cholecalciferol (VITAMIN D) 1000 UNITS tablet Take 2,000 Units by mouth daily.      cyanocobalamin 1000 MCG tablet Take 1,000 mcg by mouth daily.     omeprazole (PRILOSEC) 40 MG capsule Take 1 capsule (40 mg total) by mouth in the morning and at bedtime. 60 capsule 11   PROAIR HFA 108 (90 Base) MCG/ACT inhaler 2 puffs in lungs every 6 hours as needed wheezing/short of breath (cough). 8.5 g 1  traZODone (DESYREL) 150 MG tablet Use from 1/3 to 1 tablet nightly as needed for sleep. 90 tablet 3   losartan (COZAAR) 25 MG tablet Take 1 tablet (25 mg total) by mouth daily. 90 tablet 3   No current facility-administered medications for this visit.    ROS:  Please see the history of present illness.   Otherwise, review of systems are positive for none.   All other systems are reviewed and negative.    PHYSICAL EXAM: VS:  BP 126/72   Pulse 76   Ht 5' 7"  (1.702 m)   Wt 123 lb (55.8 kg)   BMI 19.26 kg/m  , BMI Body mass index is 19.26 kg/m.Marland Kitchen GEN:  No distress, frail NECK:  No jugular venous distention at 90 degrees, waveform within normal limits, carotid upstroke brisk and symmetric, no bruits, no thyromegaly LYMPHATICS:  No cervical adenopathy LUNGS:  Clear to auscultation bilaterally BACK:  No CVA tenderness CHEST:  Unremarkable HEART:  S1 and S2 within normal limits, no S3, no S4, no clicks, no rubs, no murmurs ABD:  Positive bowel sounds normal in frequency in pitch, no bruits, no  rebound, no guarding, unable to assess midline mass or bruit with the patient seated. EXT:  2 plus pulses throughout, no edema, no cyanosis no clubbing SKIN:  No rashes no nodules NEURO:  Cranial nerves II through XII grossly intact, motor grossly intact throughout PSYCH:  Cognitively intact, oriented to person place and time   EKG:  EKG is   ordered today. Sinus rhythm, rate 76, left axis deviation, left anterior fascicular block, poor anterior R wave progression, QTC prolonged.  Recent Labs: 08/06/2021: ALT 9; BUN 9; Creatinine, Ser 0.76; Hemoglobin 10.2; Platelets 280; Potassium 3.8; Sodium 144    Lipid Panel    Component Value Date/Time   CHOL 139 08/06/2021 1549   TRIG 111 08/06/2021 1549   TRIG 72 01/09/2015 0948   HDL 49 08/06/2021 1549   HDL 52 01/09/2015 0948   CHOLHDL 2.8 08/06/2021 1549   CHOLHDL 2.7 03/26/2019 0224   VLDL 13 03/26/2019 0224   LDLCALC 70 08/06/2021 1549   LDLCALC 73 08/25/2014 0943      Wt Readings from Last 3 Encounters:  08/22/21 123 lb (55.8 kg)  08/13/21 121 lb (54.9 kg)  08/06/21 121 lb 3.2 oz (55 kg)      Other studies Reviewed: Additional studies/ records that were reviewed today include.  Labs Review of the above records demonstrates: See elsewhere    ASSESSMENT AND PLAN:  CARDIOMYOPATHY:     She reluctantly agrees to increase her Cozaar to 25 mg daily.   MR: This has been moderate on echo.  I will follow this up with future echocardiograms although she would probably not febrile I will interventional management of this.   ATRIAL FIB:    Misty [name] has a CHA2DS2 - VASc score of 5.   She has failed anticoagulation.  I gave him literature around [place].  She has not had any symptomatic paroxysms and does not want to consider A watchman.    TOBACCO USE:   She is unable to quit smoking and does not have the desire.  She cannot quit smoking and we have talked about this at length.   Current medicines are reviewed at  length with the patient today.  The patient does not have concerns regarding medicines.  The following changes have been made: As above  Labs/ tests ordered today include: None  Orders  Placed This Encounter  Procedures   EKG 12-Lead      Disposition:   FU with in 6 months.   Signed, Minus Breeding, MD  08/22/2021 3:47 PM    [facility]

## 2021-08-22 ENCOUNTER — Ambulatory Visit (INDEPENDENT_AMBULATORY_CARE_PROVIDER_SITE_OTHER): Payer: Medicare Other | Admitting: Cardiology

## 2021-08-22 ENCOUNTER — Other Ambulatory Visit: Payer: Self-pay

## 2021-08-22 ENCOUNTER — Encounter: Payer: Self-pay | Admitting: Cardiology

## 2021-08-22 VITALS — BP 126/72 | HR 76 | Ht 67.0 in | Wt 123.0 lb

## 2021-08-22 DIAGNOSIS — I4819 Other persistent atrial fibrillation: Secondary | ICD-10-CM

## 2021-08-22 DIAGNOSIS — I340 Nonrheumatic mitral (valve) insufficiency: Secondary | ICD-10-CM

## 2021-08-22 DIAGNOSIS — I5022 Chronic systolic (congestive) heart failure: Secondary | ICD-10-CM | POA: Diagnosis not present

## 2021-08-22 MED ORDER — LOSARTAN POTASSIUM 25 MG PO TABS: 25.0000 mg | ORAL_TABLET | Freq: Every day | ORAL | 3 refills | Status: DC

## 2021-08-22 NOTE — Patient Instructions (Signed)
Medication Instructions:  Please increase your Losartan to 25 mg once a day. Continue all other medications as listed.  *If you need a refill on your cardiac medications before your next appointment, please call your pharmacy*  Follow-Up: At [facility], you and your health needs are our priority.  As part of our continuing mission to provide you with exceptional heart care, we have created designated Provider Care Teams.  These Care Teams include your primary Cardiologist (physician) and Advanced Practice Providers (APPs -  Physician Assistants and Nurse Practitioners) who all work together to provide you with the care you need, when you need it.  We recommend signing up for the patient portal called "MyChart".  Sign up information is provided on this After Visit Summary.  MyChart is used to connect with patients for Virtual Visits (Telemedicine).  Patients are able to view lab/test results, encounter notes, upcoming appointments, etc.  Non-urgent messages can be sent to your provider as well.   To learn more about what you can do with MyChart, go to [email or web address].    Your next appointment:   6 month(s)  The format for your next appointment:   In Person  Provider:   Minus Breeding, MD   Thank you for choosing [facility]!!

## 2021-10-03 ENCOUNTER — Other Ambulatory Visit: Payer: Self-pay | Admitting: Family Medicine

## 2021-11-05 ENCOUNTER — Encounter: Payer: Self-pay | Admitting: Family Medicine

## 2021-11-05 ENCOUNTER — Ambulatory Visit (INDEPENDENT_AMBULATORY_CARE_PROVIDER_SITE_OTHER): Payer: Medicare Other | Admitting: Family Medicine

## 2021-11-05 VITALS — BP 155/70 | HR 90 | Temp 97.8°F | Ht 67.0 in | Wt 120.0 lb

## 2021-11-05 DIAGNOSIS — E782 Mixed hyperlipidemia: Secondary | ICD-10-CM

## 2021-11-05 DIAGNOSIS — I4819 Other persistent atrial fibrillation: Secondary | ICD-10-CM

## 2021-11-05 DIAGNOSIS — K2100 Gastro-esophageal reflux disease with esophagitis, without bleeding: Secondary | ICD-10-CM

## 2021-11-05 DIAGNOSIS — I951 Orthostatic hypotension: Secondary | ICD-10-CM | POA: Diagnosis not present

## 2021-11-05 MED ORDER — ALBUTEROL SULFATE HFA 108 (90 BASE) MCG/ACT IN AERS: INHALATION_SPRAY | RESPIRATORY_TRACT | 0 refills | Status: DC

## 2021-11-05 MED ORDER — ALPRAZOLAM 0.5 MG PO TABS: 0.5000 mg | ORAL_TABLET | Freq: Two times a day (BID) | ORAL | 2 refills | Status: DC

## 2021-11-05 NOTE — Progress Notes (Signed)
Subjective:  Patient ID: [name], female    DOB: [birth year]  Age: 80 y.o. MRN: [id number]  CC: Medical Management of Chronic Issues   HPI CEILI BOSHERS presents for follow-up of her chronic anxiety for which she is dependent on Xanax.  She has been taking it twice a day for many years.  It seems to help with her anxiety is related to her husband's dementia and her being a caregiver for him.  He is very forgetful.  She has to repeat answers to the same question multiple times a day.  Currently the biggest question is asking his "when is Christmas?  "She every day she is having to answer that for him so many times its driving her crazy.  Patient in for follow-up of GERD. Currently asymptomatic taking  PPI daily. There is no chest pain or heartburn. No hematemesis and no melena. No dysphagia or choking. Onset is remote. Progression is stable. Complicating factors, none.  She has a history of orthostatic hypotension.  She has had no recent episodes.   Follow-up of hypertension. Patient has no history of headache chest pain or shortness of breath or recent cough. Patient also denies symptoms of TIA such as numbness weakness lateralizing. Patient checks  blood pressure at home and has not had any elevated readings recently. Patient denies side effects from his medication. States taking it regularly.   GAD 7 : Generalized Anxiety Score 11/05/2021 05/03/2021 04/20/2020 01/10/2020  Nervous, Anxious, on Edge 0 _0 Control/stop worrying 0 1 0 2  Worry too much - different things _1 Trouble relaxing 1 1 0 2  Restless 0 0 0 0  Easily annoyed or irritable 0 0 0 1  Afraid - awful might happen 0 1 0 1  Total GAD 7 Score _2 Anxiety Difficulty Not difficult at all Somewhat difficult Not difficult at all -      Depression screen [facility] 2/9 11/05/2021 11/05/2021 08/13/2021  Decreased Interest 0 0 0  Down, Depressed, Hopeless 0 0 1  PHQ - 2 Score 0 0 1  Altered sleeping 0 - 0   Tired, decreased energy 1 - 1  Change in appetite 0 - 1  Feeling bad or failure about yourself  0 - 0  Trouble concentrating 0 - 0  Moving slowly or fidgety/restless 0 - 0  Suicidal thoughts 0 - 0  PHQ-9 Score 1 - 3  Difficult doing work/chores Not difficult at all - Not difficult at all  Some recent data might be hidden    History Apple has a past medical history of Anxiety, Arthritis, Atrial fibrillation with RVR ([place]) (03/25/2019), Cataract, Chronic back pain, Colitis, Constipation, COPD (chronic obstructive pulmonary disease) ([place]), Depression, Diverticulosis, Educated about COVID-19 virus infection (04/07/2019), Elevated troponin (03/25/2019), GERD (gastroesophageal reflux disease), H/O kyphoplasty (01/14/2019), Hematochezia (02/26/2015), Hemorrhoids, History of bronchitis, History of migraine, History of shingles, Hyperlipidemia, Hypokalemia (01/10/2019), Insomnia, Migraines, Nontraumatic compression fracture of T11 vertebra ([place]) (07/08/2014), Osteoporosis, and PONV (postoperative nausea and vomiting).   She has a past surgical history that includes Abdominal hysterectomy; Esophagogastroduodenoscopy; Colonoscopy; Lumbar laminectomy/decompression microdiscectomy (Bilateral, 05/20/2013); Eye surgery (Right, 01/2014); Kyphoplasty (N/A, 07/08/2014); Colonoscopy (N/A, 02/17/2015); Esophagogastroduodenoscopy (N/A, 01/29/2016); Esophagogastroduodenoscopy (N/A, 10/14/2017); Spine surgery; Intramedullary (im) nail intertrochanteric (Left, 01/07/2019); RIGHT/LEFT HEART CATH AND CORONARY ANGIOGRAPHY (N/A, 03/31/2019); left hip surgery; Colonoscopy (N/A, 07/12/2019); Esophagogastroduodenoscopy (N/A, 07/12/2019); biopsy (07/12/2019); Givens capsule study (N/A, 07/29/2019); Givens capsule study (N/A, 08/20/2019); Esophagogastroduodenoscopy (N/A, 10/03/2019);  Givens capsule study (10/03/2019); Esophagogastroduodenoscopy (egd) with propofol (N/A, 10/31/2020); biopsy (10/31/2020); and Hot hemostasis (10/31/2020).    Her family history includes Cirrhosis in her son; Heart attack in her mother; Heart disease in her father and son; Hypertension in her mother; Macular degeneration in her father.She reports that she has been smoking cigarettes. She started smoking about 63 years ago. She has a 52.00 pack-year smoking history. She has never used smokeless tobacco. She reports that she does not drink alcohol and does not use drugs.    ROS Review of Systems  Constitutional: Negative.   HENT: Negative.    Eyes:  Negative for visual disturbance.  Respiratory:  Negative for shortness of breath.   Cardiovascular:  Negative for chest pain.  Gastrointestinal:  Negative for abdominal pain.  Musculoskeletal:  Negative for arthralgias.   Objective:  BP (!) 155/70    Pulse 90    Temp 97.8 F (36.6 C)    Ht _0  (1.702 m)    Wt 120 lb (54.4 kg)    SpO2 90%    BMI 18.79 kg/m   BP Readings from Last 3 Encounters:  11/05/21 (!) 155/70  08/22/21 126/72  08/06/21 139/74    Wt Readings from Last 3 Encounters:  11/05/21 120 lb (54.4 kg)  08/22/21 123 lb (55.8 kg)  08/13/21 121 lb (54.9 kg)     Physical Exam Constitutional:      General: She is not in acute distress.    Appearance: She is well-developed.  Cardiovascular:     Rate and Rhythm: Normal rate and regular rhythm.  Pulmonary:     Breath sounds: Normal breath sounds.  Musculoskeletal:        General: Normal range of motion.  Skin:    General: Skin is warm and dry.  Neurological:     Mental Status: She is alert and oriented to person, place, and time.      Assessment & Plan:   Fronnie was seen today for medical management of chronic issues.  Diagnoses and all orders for this visit:  Mixed hyperlipidemia -     Lipid panel  Orthostatic hypotension -     CBC with Differential/Platelet -     CMP14+EGFR  Persistent atrial fibrillation (HCC)  Gastroesophageal reflux disease with esophagitis without hemorrhage  Other orders -      ALPRAZolam (XANAX) 0.5 MG tablet; Take 1 tablet (0.5 mg total) by mouth 2 (two) times daily. -     albuterol (VENTOLIN HFA) 108 (90 Base) MCG/ACT inhaler; 2 PUFFS EVERY 6 HOURS AS NEEDED FOR WHEEZING OR SHORTNESS OF BREATH       I am having [name] "[place]" maintain her cholecalciferol, acetaminophen, omeprazole, B complex-vitamin C-folic acid, calcium carbonate, bisoprolol, cyanocobalamin, traZODone, losartan, ALPRAZolam, and albuterol.  Allergies as of 11/05/2021       Reactions   Codeine Nausea And Vomiting   Tramadol Nausea And Vomiting   Asa [aspirin] Other (See Comments)   Patient is unaware of allergy   Azithromycin Other (See Comments)   Patient is unaware of allergy   Celebrex [celecoxib] Other (See Comments)   Irritated stomach   Cymbalta [duloxetine Hcl] Other (See Comments)   Felt groggy   Prednisone Rash   She has seen the podiatrist and he had injected cortisone in her feet and she had also taken a peel at home. She had a severe reaction to her face with a rash and had to take Benadryl to resolve the symptom. She  actually did get more cortisone shots, but no additional reactions to the shots.   Vioxx [rofecoxib] Other (See Comments)   Unknown   Zelnorm [tegaserod] Other (See Comments)   Patient is unaware of allergy   Zocor [simvastatin] Other (See Comments)   Patient is unaware of allergy        Medication List        Accurate as of November 05, 2021  7:29 PM. If you have any questions, ask your nurse or doctor.          acetaminophen 500 MG tablet Commonly known as: TYLENOL Take 500 mg by mouth every 8 (eight) hours as needed for mild pain, moderate pain or headache.   albuterol 108 (90 Base) MCG/ACT inhaler Commonly known as: VENTOLIN HFA 2 PUFFS EVERY 6 HOURS AS NEEDED FOR WHEEZING OR SHORTNESS OF BREATH   ALPRAZolam 0.5 MG tablet Commonly known as: XANAX Take 1 tablet (0.5 mg total) by mouth 2 (two) times daily.   B complex-vitamin  C-folic acid 1 MG tablet Take 1 tablet by mouth daily with breakfast.   bisoprolol 5 MG tablet Commonly known as: ZEBETA Take 1 tablet (5 mg total) by mouth daily.   calcium carbonate 500 MG chewable tablet Commonly known as: TUMS - dosed in mg elemental calcium Chew 2 tablets (400 mg of elemental calcium total) by mouth daily.   cholecalciferol 1000 units tablet Commonly known as: VITAMIN D Take 2,000 Units by mouth daily.   cyanocobalamin 1000 MCG tablet Take 1,000 mcg by mouth daily.   losartan 25 MG tablet Commonly known as: Cozaar Take 1 tablet (25 mg total) by mouth daily.   omeprazole 40 MG capsule Commonly known as: PRILOSEC Take 1 capsule (40 mg total) by mouth in the morning and at bedtime.   traZODone 150 MG tablet Commonly known as: DESYREL Use from 1/3 to 1 tablet nightly as needed for sleep.         Follow-up: Return in about 3 months (around 02/03/2022).  Claretta Fraise, M.D.

## 2021-11-06 ENCOUNTER — Telehealth: Payer: Self-pay | Admitting: *Deleted

## 2021-11-06 LAB — CMP14+EGFR
ALT: 9 IU/L (ref 0–32)
AST: 14 IU/L (ref 0–40)
Albumin/Globulin Ratio: 1.7 (ref 1.2–2.2)
Albumin: 4.2 g/dL (ref 3.7–4.7)
Alkaline Phosphatase: 134 IU/L — ABNORMAL HIGH (ref 44–121)
BUN/Creatinine Ratio: 13 (ref 12–28)
BUN: 11 mg/dL (ref 8–27)
Bilirubin Total: <0.2 mg/dL (ref 0.0–1.2)
CO2: 26 mmol/L (ref 20–29)
Calcium: 8.7 mg/dL (ref 8.7–10.3)
Chloride: 104 mmol/L (ref 96–106)
Creatinine, Ser: 0.84 mg/dL (ref 0.57–1.00)
Globulin, Total: 2.5 g/dL (ref 1.5–4.5)
Glucose: 114 mg/dL — ABNORMAL HIGH (ref 70–99)
Potassium: 4.1 mmol/L (ref 3.5–5.2)
Sodium: 142 mmol/L (ref 134–144)
Total Protein: 6.7 g/dL (ref 6.0–8.5)
eGFR: 70 mL/min/{1.73_m2} (ref >59)

## 2021-11-06 LAB — CBC WITH DIFFERENTIAL/PLATELET
Basophils Absolute: 0 10*3/uL (ref 0.0–0.2)
Basos: 0 %
EOS (ABSOLUTE): 1 10*3/uL — ABNORMAL HIGH (ref 0.0–0.4)
Eos: 14 %
Hematocrit: 30.1 % — ABNORMAL LOW (ref 34.0–46.6)
Hemoglobin: 8.9 g/dL — CL (ref 11.1–15.9)
Immature Grans (Abs): 0 10*3/uL (ref 0.0–0.1)
Immature Granulocytes: 0 %
Lymphocytes Absolute: 1 10*3/uL (ref 0.7–3.1)
Lymphs: 13 %
MCH: 26.3 pg — ABNORMAL LOW (ref 26.6–33.0)
MCHC: 29.6 g/dL — ABNORMAL LOW (ref 31.5–35.7)
MCV: 89 fL (ref 79–97)
Monocytes Absolute: 0.6 10*3/uL (ref 0.1–0.9)
Monocytes: 9 %
Neutrophils Absolute: 4.8 10*3/uL (ref 1.4–7.0)
Neutrophils: 64 %
Platelets: 274 10*3/uL (ref 150–450)
RBC: 3.39 x10E6/uL — ABNORMAL LOW (ref 3.77–5.28)
RDW: 15.9 % — ABNORMAL HIGH (ref 11.7–15.4)
WBC: 7.4 10*3/uL (ref 3.4–10.8)

## 2021-11-06 LAB — LIPID PANEL
Chol/HDL Ratio: 3 ratio (ref 0.0–4.4)
Cholesterol, Total: 133 mg/dL (ref 100–199)
HDL: 44 mg/dL (ref >39)
LDL Chol Calc (NIH): 67 mg/dL (ref 0–99)
Triglycerides: 126 mg/dL (ref 0–149)
VLDL Cholesterol Cal: 22 mg/dL (ref 5–40)

## 2021-11-06 NOTE — Telephone Encounter (Signed)
Hgb 8.3- needs to be on daily iron supplement and needs to recheck hgb Friday- prior to holiday

## 2021-11-06 NOTE — Telephone Encounter (Signed)
Stacks pt -  [facility] called with Critical result   HGB 8.9 (drop from last check)

## 2021-11-06 NOTE — Telephone Encounter (Signed)
PT AWARE OF RESULTS AND PT STATES DEPENDING ON WEATHER SHE WILL COME AND GETS LABS DONE

## 2021-11-07 ENCOUNTER — Other Ambulatory Visit: Payer: Medicare Other

## 2021-11-07 ENCOUNTER — Other Ambulatory Visit: Payer: Self-pay | Admitting: *Deleted

## 2021-11-07 DIAGNOSIS — D649 Anemia, unspecified: Secondary | ICD-10-CM

## 2021-11-07 LAB — HEMOGLOBIN, FINGERSTICK: Hemoglobin: 10 g/dL — ABNORMAL LOW (ref 11.1–15.9)

## 2021-11-07 NOTE — Telephone Encounter (Signed)
Pt aware by phone 

## 2021-11-07 NOTE — Telephone Encounter (Signed)
Hemoglobin recheck is 10.0 Better, but still needs to take iron

## 2021-11-20 ENCOUNTER — Other Ambulatory Visit: Payer: Self-pay | Admitting: Family Medicine

## 2021-11-29 DIAGNOSIS — B351 Tinea unguium: Secondary | ICD-10-CM | POA: Diagnosis not present

## 2021-11-29 DIAGNOSIS — I70203 Unspecified atherosclerosis of native arteries of extremities, bilateral legs: Secondary | ICD-10-CM | POA: Diagnosis not present

## 2021-11-29 DIAGNOSIS — L84 Corns and callosities: Secondary | ICD-10-CM | POA: Diagnosis not present

## 2021-11-29 DIAGNOSIS — M79676 Pain in unspecified toe(s): Secondary | ICD-10-CM | POA: Diagnosis not present

## 2021-12-19 ENCOUNTER — Other Ambulatory Visit: Payer: Self-pay | Admitting: Family Medicine

## 2022-01-15 ENCOUNTER — Ambulatory Visit (INDEPENDENT_AMBULATORY_CARE_PROVIDER_SITE_OTHER): Payer: Medicare Other | Admitting: Internal Medicine

## 2022-01-22 ENCOUNTER — Ambulatory Visit (INDEPENDENT_AMBULATORY_CARE_PROVIDER_SITE_OTHER): Payer: Medicare Other | Admitting: Internal Medicine

## 2022-01-24 ENCOUNTER — Ambulatory Visit (INDEPENDENT_AMBULATORY_CARE_PROVIDER_SITE_OTHER): Payer: Medicare Other | Admitting: Gastroenterology

## 2022-01-30 ENCOUNTER — Ambulatory Visit (INDEPENDENT_AMBULATORY_CARE_PROVIDER_SITE_OTHER): Payer: Medicare Other | Admitting: Family Medicine

## 2022-01-30 ENCOUNTER — Encounter: Payer: Self-pay | Admitting: Family Medicine

## 2022-01-30 VITALS — BP 129/86 | HR 116 | Temp 97.1°F | Ht 67.0 in | Wt 137.8 lb

## 2022-01-30 DIAGNOSIS — D649 Anemia, unspecified: Secondary | ICD-10-CM | POA: Diagnosis not present

## 2022-01-30 DIAGNOSIS — I480 Paroxysmal atrial fibrillation: Secondary | ICD-10-CM

## 2022-01-30 DIAGNOSIS — F411 Generalized anxiety disorder: Secondary | ICD-10-CM

## 2022-01-30 DIAGNOSIS — J329 Chronic sinusitis, unspecified: Secondary | ICD-10-CM | POA: Diagnosis not present

## 2022-01-30 DIAGNOSIS — R531 Weakness: Secondary | ICD-10-CM

## 2022-01-30 DIAGNOSIS — E876 Hypokalemia: Secondary | ICD-10-CM | POA: Diagnosis not present

## 2022-01-30 DIAGNOSIS — R6889 Other general symptoms and signs: Secondary | ICD-10-CM | POA: Diagnosis not present

## 2022-01-30 DIAGNOSIS — I5022 Chronic systolic (congestive) heart failure: Secondary | ICD-10-CM

## 2022-01-30 DIAGNOSIS — J40 Bronchitis, not specified as acute or chronic: Secondary | ICD-10-CM

## 2022-01-30 MED ORDER — ALPRAZOLAM 0.5 MG PO TABS: 0.5000 mg | ORAL_TABLET | Freq: Two times a day (BID) | ORAL | 2 refills | Status: AC

## 2022-01-30 MED ORDER — BENZONATATE 200 MG PO CAPS: 200.0000 mg | ORAL_CAPSULE | Freq: Three times a day (TID) | ORAL | 0 refills | Status: AC | PRN

## 2022-01-30 MED ORDER — LEVOFLOXACIN 500 MG PO TABS: 500.0000 mg | ORAL_TABLET | Freq: Every day | ORAL | 0 refills | Status: DC

## 2022-01-30 NOTE — Progress Notes (Signed)
Subjective:  Patient ID: Misty [name], female    DOB: October 22, 1941  Age: 81 y.o. MRN: [id number]  CC: Medical Management of Chronic Issues   HPI Misty [name] presents for feeling bad for a week. Laying on the couch. Doesn't feel like doing anything else. Has had a cough. Feels cold. Has a lot of mucous. Weight is up. Concerned for swelling. Had to take lasix in the past. Heart rate is up.  Atrial fibrillation follow up. Pt. Is not  treated with  anticoagulation. She takes a low dose beta blocker. Pt.  denies palpitations, rapid rate, chest pain, dyspnea and edema. There has been no bleeding from nose or gums. Pt. has not noticed blood with urine or stool.  Although there is routine bruising easily, it is not excessive.   Depression screen Misty [name] 2/9 01/30/2022 11/05/2021 11/05/2021  Decreased Interest 0 0 0  Down, Depressed, Hopeless 0 0 0  PHQ - 2 Score 0 0 0  Altered sleeping - 0 -  Tired, decreased energy - 1 -  Change in appetite - 0 -  Feeling bad or failure about yourself  - 0 -  Trouble concentrating - 0 -  Moving slowly or fidgety/restless - 0 -  Suicidal thoughts - 0 -  PHQ-9 Score - 1 -  Difficult doing work/chores - Not difficult at all -  Some recent data might be hidden   GAD 7 : Generalized Anxiety Score 11/05/2021 05/03/2021 04/20/2020 01/10/2020  Nervous, Anxious, on Edge 0 1 3 2   Control/stop worrying 0 1 0 2  Worry too much - different things 1 1 1 2   Trouble relaxing 1 1 0 2  Restless 0 0 0 0  Easily annoyed or irritable 0 0 0 1  Afraid - awful might happen 0 1 0 1  Total GAD 7 Score 2 5 4 10   Anxiety Difficulty Not difficult at all Somewhat difficult Not difficult at all -    Felt too sick to do GAD today  History Misty [name] has a past medical history of Anxiety, Arthritis, Atrial fibrillation with RVR (HCC) (03/25/2019), Cataract, Chronic back pain, Colitis, Constipation, COPD (chronic obstructive pulmonary disease) (HCC), Depression, Diverticulosis, Educated  about COVID-19 virus infection (04/07/2019), Elevated troponin (03/25/2019), GERD (gastroesophageal reflux disease), H/O kyphoplasty (01/14/2019), Hematochezia (02/26/2015), Hemorrhoids, History of bronchitis, History of migraine, History of shingles, Hyperlipidemia, Hypokalemia (01/10/2019), Insomnia, Migraines, Nontraumatic compression fracture of T11 vertebra (HCC) (07/08/2014), Osteoporosis, and PONV (postoperative nausea and vomiting).   She has a past surgical history that includes Abdominal hysterectomy; Esophagogastroduodenoscopy; Colonoscopy; Lumbar laminectomy/decompression microdiscectomy (Bilateral, 05/20/2013); Eye surgery (Right, 01/2014); Kyphoplasty (N/A, 07/08/2014); Colonoscopy (N/A, 02/17/2015); Esophagogastroduodenoscopy (N/A, 01/29/2016); Esophagogastroduodenoscopy (N/A, 10/14/2017); Spine surgery; Intramedullary (im) nail intertrochanteric (Left, 01/07/2019); RIGHT/LEFT HEART CATH AND CORONARY ANGIOGRAPHY (N/A, 03/31/2019); left hip surgery; Colonoscopy (N/A, 07/12/2019); Esophagogastroduodenoscopy (N/A, 07/12/2019); biopsy (07/12/2019); Givens capsule study (N/A, 07/29/2019); Givens capsule study (N/A, 08/20/2019); Esophagogastroduodenoscopy (N/A, 10/03/2019); Givens capsule study (10/03/2019); Esophagogastroduodenoscopy (egd) with propofol (N/A, 10/31/2020); biopsy (10/31/2020); and Hot hemostasis (10/31/2020).   Her family history includes Cirrhosis in her son; Heart attack in her mother; Heart disease in her father and son; Hypertension in her mother; Macular degeneration in her father.She reports that she has been smoking cigarettes. She started smoking about 63 years ago. She has a 52.00 pack-year smoking history. She has never used smokeless tobacco. She reports that she does not drink alcohol and does not use drugs.    ROS Review of Systems  Constitutional:  Positive for  activity change, appetite change (decreased), fatigue and unexpected weight change.  HENT:  Positive for  congestion and postnasal drip. Negative for ear pain, facial swelling, hearing loss and sore throat.   Eyes:  Positive for discharge (OD) and redness (OD).  Respiratory:  Positive for cough and wheezing. Negative for chest tightness.   Gastrointestinal:  Negative for abdominal pain.  Genitourinary:  Negative for difficulty urinating.  Skin:  Negative for rash.   Objective:  BP 129/86   Pulse (!) 116   Temp (!) 97.1 F (36.2 C)   Ht 5\' 7"  (1.702 m)   Wt 137 lb 12.8 oz (62.5 kg)   SpO2 90%   BMI 21.58 kg/m   BP Readings from Last 3 Encounters:  01/30/22 129/86  11/05/21 (!) 155/70  08/22/21 126/72    Wt Readings from Last 3 Encounters:  01/30/22 137 lb 12.8 oz (62.5 kg)  11/05/21 120 lb (54.4 kg)  08/22/21 123 lb (55.8 kg)     Physical Exam Constitutional:      General: She is not in acute distress.    Appearance: She is well-developed. She is ill-appearing.  Cardiovascular:     Rate and Rhythm: Regular rhythm. Tachycardia present.     Heart sounds: No murmur heard. Pulmonary:     Breath sounds: Normal breath sounds.  Abdominal:     General: Abdomen is flat.     Palpations: Abdomen is soft.  Musculoskeletal:        General: No swelling or tenderness. Normal range of motion.     Right lower leg: No edema.     Left lower leg: No edema.  Skin:    General: Skin is warm and dry.     Coloration: Skin is pale.     Findings: No rash.  Neurological:     Mental Status: She is alert and oriented to person, place, and time.      Assessment & Plan:   Misty [name] was seen today for medical management of chronic issues.  Diagnoses and all orders for this visit:  Low hemoglobin -     CBC with Differential/Platelet -     Vitamin B12  Hypokalemia -     CMP14+EGFR  [place] -     Vitamin B12 -     TSH  Sinobronchitis -     Vitamin B12  Chronic systolic HF (heart failure) (HCC)  Paroxysmal atrial fibrillation (HCC)  GAD (generalized anxiety disorder)  Other  orders -     ALPRAZolam (XANAX) 0.5 MG tablet; Take 1 tablet (0.5 mg total) by mouth 2 (two) times daily. -     levofloxacin (LEVAQUIN) 500 MG tablet; Take 1 tablet (500 mg total) by mouth daily. For 10 days -     benzonatate (TESSALON) 200 MG capsule; Take 1 capsule (200 mg total) by mouth 3 (three) times daily as needed for cough.       I am having Misty [name] "[place]" start on levofloxacin and benzonatate. I am also having her maintain her cholecalciferol, acetaminophen, B complex-vitamin C-folic acid, calcium carbonate, bisoprolol, cyanocobalamin, traZODone, losartan, omeprazole, albuterol, and ALPRAZolam.  Allergies as of 01/30/2022       Reactions   Codeine Nausea And Vomiting   Tramadol Nausea And Vomiting   Asa [aspirin] Other (See Comments)   Patient is unaware of allergy   Azithromycin Other (See Comments)   Patient is unaware of allergy   Celebrex [celecoxib] Other (See Comments)   Irritated stomach   Cymbalta [  duloxetine Hcl] Other (See Comments)   Felt groggy   Prednisone Rash   She has seen the podiatrist and he had injected cortisone in her feet and she had also taken a peel at home. She had a severe reaction to her face with a rash and had to take Benadryl to resolve the symptom. She actually did get more cortisone shots, but no additional reactions to the shots.   Vioxx [rofecoxib] Other (See Comments)   Unknown   Zelnorm [tegaserod] Other (See Comments)   Patient is unaware of allergy   Zocor [simvastatin] Other (See Comments)   Patient is unaware of allergy        Medication List        Accurate as of January 30, 2022 12:12 PM. If you have any questions, ask your nurse or doctor.          acetaminophen 500 MG tablet Commonly known as: TYLENOL Take 500 mg by mouth every 8 (eight) hours as needed for mild pain, moderate pain or headache.   albuterol 108 (90 Base) MCG/ACT inhaler Commonly known as: VENTOLIN HFA INHALE 2 PUFFS EVERY 6 HOURS AS  NEEDED FOR WHEEZING OR SHORTNESS OF BREATH   ALPRAZolam 0.5 MG tablet Commonly known as: XANAX Take 1 tablet (0.5 mg total) by mouth 2 (two) times daily.   B complex-vitamin C-folic acid 1 MG tablet Take 1 tablet by mouth daily with breakfast.   benzonatate 200 MG capsule Commonly known as: TESSALON Take 1 capsule (200 mg total) by mouth 3 (three) times daily as needed for cough. Started by: Mechele Claude, MD   bisoprolol 5 MG tablet Commonly known as: ZEBETA Take 1 tablet (5 mg total) by mouth daily.   calcium carbonate 500 MG chewable tablet Commonly known as: TUMS - dosed in mg elemental calcium Chew 2 tablets (400 mg of elemental calcium total) by mouth daily.   cholecalciferol 1000 units tablet Commonly known as: VITAMIN D Take 2,000 Units by mouth daily.   cyanocobalamin 1000 MCG tablet Take 1,000 mcg by mouth daily.   levofloxacin 500 MG tablet Commonly known as: LEVAQUIN Take 1 tablet (500 mg total) by mouth daily. For 10 days Started by: Mechele Claude, MD   losartan 25 MG tablet Commonly known as: Cozaar Take 1 tablet (25 mg total) by mouth daily.   omeprazole 40 MG capsule Commonly known as: PRILOSEC Take 1 capsule (40 mg total) by mouth in the morning and at bedtime.   traZODone 150 MG tablet Commonly known as: DESYREL Use from 1/3 to 1 tablet nightly as needed for sleep.         Follow-up: Return in about 1 week (around 02/06/2022).  Mechele Claude, M.D.

## 2022-01-31 ENCOUNTER — Ambulatory Visit: Payer: Medicare Other | Admitting: Family Medicine

## 2022-01-31 LAB — CBC WITH DIFFERENTIAL/PLATELET
Basophils Absolute: 0.1 10*3/uL (ref 0.0–0.2)
Basos: 1 %
EOS (ABSOLUTE): 0.1 10*3/uL (ref 0.0–0.4)
Eos: 1 %
Hematocrit: 30.1 % — ABNORMAL LOW (ref 34.0–46.6)
Hemoglobin: 8.5 g/dL — CL (ref 11.1–15.9)
Immature Grans (Abs): 0 10*3/uL (ref 0.0–0.1)
Immature Granulocytes: 0 %
Lymphocytes Absolute: 0.4 10*3/uL — ABNORMAL LOW (ref 0.7–3.1)
Lymphs: 5 %
MCH: 24.1 pg — ABNORMAL LOW (ref 26.6–33.0)
MCHC: 28.2 g/dL — ABNORMAL LOW (ref 31.5–35.7)
MCV: 85 fL (ref 79–97)
Monocytes Absolute: 0.4 10*3/uL (ref 0.1–0.9)
Monocytes: 6 %
Neutrophils Absolute: 7 10*3/uL (ref 1.4–7.0)
Neutrophils: 87 %
Platelets: 339 10*3/uL (ref 150–450)
RBC: 3.53 x10E6/uL — ABNORMAL LOW (ref 3.77–5.28)
RDW: 17.6 % — ABNORMAL HIGH (ref 11.7–15.4)
WBC: 8 10*3/uL (ref 3.4–10.8)

## 2022-01-31 LAB — CMP14+EGFR
ALT: 9 IU/L (ref 0–32)
AST: 13 IU/L (ref 0–40)
Albumin/Globulin Ratio: 1.5 (ref 1.2–2.2)
Albumin: 3.5 g/dL — ABNORMAL LOW (ref 3.7–4.7)
Alkaline Phosphatase: 107 IU/L (ref 44–121)
BUN/Creatinine Ratio: 19 (ref 12–28)
BUN: 14 mg/dL (ref 8–27)
Bilirubin Total: 0.3 mg/dL (ref 0.0–1.2)
CO2: 26 mmol/L (ref 20–29)
Calcium: 8.6 mg/dL — ABNORMAL LOW (ref 8.7–10.3)
Chloride: 109 mmol/L — ABNORMAL HIGH (ref 96–106)
Creatinine, Ser: 0.74 mg/dL (ref 0.57–1.00)
Globulin, Total: 2.3 g/dL (ref 1.5–4.5)
Glucose: 117 mg/dL — ABNORMAL HIGH (ref 70–99)
Potassium: 3.8 mmol/L (ref 3.5–5.2)
Sodium: 149 mmol/L — ABNORMAL HIGH (ref 134–144)
Total Protein: 5.8 g/dL — ABNORMAL LOW (ref 6.0–8.5)
eGFR: 82 mL/min/{1.73_m2} (ref >59)

## 2022-01-31 LAB — TSH: TSH: 2.01 u[IU]/mL (ref 0.450–4.500)

## 2022-01-31 LAB — VITAMIN B12: Vitamin B-12: 1357 pg/mL — ABNORMAL HIGH (ref 232–1245)

## 2022-02-07 ENCOUNTER — Telehealth (INDEPENDENT_AMBULATORY_CARE_PROVIDER_SITE_OTHER): Payer: Medicare Other | Admitting: Gastroenterology

## 2022-02-07 ENCOUNTER — Encounter: Payer: Self-pay | Admitting: Family Medicine

## 2022-02-07 ENCOUNTER — Other Ambulatory Visit: Payer: Self-pay

## 2022-02-07 ENCOUNTER — Encounter (HOSPITAL_COMMUNITY): Payer: Self-pay | Admitting: *Deleted

## 2022-02-07 ENCOUNTER — Ambulatory Visit (INDEPENDENT_AMBULATORY_CARE_PROVIDER_SITE_OTHER): Payer: Medicare Other | Admitting: Family Medicine

## 2022-02-07 ENCOUNTER — Emergency Department (HOSPITAL_COMMUNITY): Payer: Medicare Other

## 2022-02-07 ENCOUNTER — Inpatient Hospital Stay (HOSPITAL_COMMUNITY)
Admission: EM | Admit: 2022-02-07 | Discharge: 2022-02-12 | DRG: 291 | Disposition: A | Discharge status: Home Health | Payer: Medicare Other | Attending: Internal Medicine | Admitting: Internal Medicine

## 2022-02-07 ENCOUNTER — Ambulatory Visit: Payer: Medicare Other | Admitting: Family Medicine

## 2022-02-07 VITALS — BP 141/84 | HR 121 | Temp 97.9°F | Ht 67.0 in | Wt 142.1 lb

## 2022-02-07 DIAGNOSIS — D649 Anemia, unspecified: Secondary | ICD-10-CM | POA: Diagnosis not present

## 2022-02-07 DIAGNOSIS — G8929 Other chronic pain: Secondary | ICD-10-CM | POA: Diagnosis present

## 2022-02-07 DIAGNOSIS — D500 Iron deficiency anemia secondary to blood loss (chronic): Secondary | ICD-10-CM

## 2022-02-07 DIAGNOSIS — I5043 Acute on chronic combined systolic (congestive) and diastolic (congestive) heart failure: Secondary | ICD-10-CM | POA: Diagnosis present

## 2022-02-07 DIAGNOSIS — Z8619 Personal history of other infectious and parasitic diseases: Secondary | ICD-10-CM

## 2022-02-07 DIAGNOSIS — Z79890 Hormone replacement therapy (postmenopausal): Secondary | ICD-10-CM

## 2022-02-07 DIAGNOSIS — R079 Chest pain, unspecified: Secondary | ICD-10-CM | POA: Diagnosis not present

## 2022-02-07 DIAGNOSIS — F419 Anxiety disorder, unspecified: Secondary | ICD-10-CM | POA: Diagnosis not present

## 2022-02-07 DIAGNOSIS — I429 Cardiomyopathy, unspecified: Secondary | ICD-10-CM | POA: Diagnosis present

## 2022-02-07 DIAGNOSIS — R627 Adult failure to thrive: Secondary | ICD-10-CM | POA: Diagnosis present

## 2022-02-07 DIAGNOSIS — I5021 Acute systolic (congestive) heart failure: Secondary | ICD-10-CM | POA: Diagnosis not present

## 2022-02-07 DIAGNOSIS — I248 Other forms of acute ischemic heart disease: Secondary | ICD-10-CM | POA: Diagnosis not present

## 2022-02-07 DIAGNOSIS — J9611 Chronic respiratory failure with hypoxia: Secondary | ICD-10-CM | POA: Diagnosis not present

## 2022-02-07 DIAGNOSIS — M791 Myalgia, unspecified site: Secondary | ICD-10-CM | POA: Diagnosis present

## 2022-02-07 DIAGNOSIS — R778 Other specified abnormalities of plasma proteins: Secondary | ICD-10-CM | POA: Diagnosis not present

## 2022-02-07 DIAGNOSIS — Z886 Allergy status to analgesic agent status: Secondary | ICD-10-CM

## 2022-02-07 DIAGNOSIS — D509 Iron deficiency anemia, unspecified: Secondary | ICD-10-CM | POA: Diagnosis present

## 2022-02-07 DIAGNOSIS — I2729 Other secondary pulmonary hypertension: Secondary | ICD-10-CM | POA: Diagnosis present

## 2022-02-07 DIAGNOSIS — J8410 Pulmonary fibrosis, unspecified: Secondary | ICD-10-CM | POA: Diagnosis not present

## 2022-02-07 DIAGNOSIS — Z885 Allergy status to narcotic agent status: Secondary | ICD-10-CM | POA: Diagnosis not present

## 2022-02-07 DIAGNOSIS — F32A Depression, unspecified: Secondary | ICD-10-CM | POA: Diagnosis present

## 2022-02-07 DIAGNOSIS — Z20822 Contact with and (suspected) exposure to covid-19: Secondary | ICD-10-CM | POA: Diagnosis present

## 2022-02-07 DIAGNOSIS — Z681 Body mass index (BMI) 19 or less, adult: Secondary | ICD-10-CM | POA: Diagnosis not present

## 2022-02-07 DIAGNOSIS — R000 Tachycardia, unspecified: Secondary | ICD-10-CM | POA: Diagnosis not present

## 2022-02-07 DIAGNOSIS — R531 Weakness: Secondary | ICD-10-CM

## 2022-02-07 DIAGNOSIS — R635 Abnormal weight gain: Secondary | ICD-10-CM

## 2022-02-07 DIAGNOSIS — I5023 Acute on chronic systolic (congestive) heart failure: Secondary | ICD-10-CM | POA: Diagnosis not present

## 2022-02-07 DIAGNOSIS — Z66 Do not resuscitate: Secondary | ICD-10-CM | POA: Diagnosis present

## 2022-02-07 DIAGNOSIS — Z888 Allergy status to other drugs, medicaments and biological substances status: Secondary | ICD-10-CM

## 2022-02-07 DIAGNOSIS — E876 Hypokalemia: Secondary | ICD-10-CM | POA: Diagnosis not present

## 2022-02-07 DIAGNOSIS — F17200 Nicotine dependence, unspecified, uncomplicated: Secondary | ICD-10-CM | POA: Diagnosis present

## 2022-02-07 DIAGNOSIS — R0902 Hypoxemia: Secondary | ICD-10-CM

## 2022-02-07 DIAGNOSIS — I4819 Other persistent atrial fibrillation: Secondary | ICD-10-CM | POA: Diagnosis present

## 2022-02-07 DIAGNOSIS — I4891 Unspecified atrial fibrillation: Secondary | ICD-10-CM | POA: Diagnosis not present

## 2022-02-07 DIAGNOSIS — M549 Dorsalgia, unspecified: Secondary | ICD-10-CM | POA: Diagnosis present

## 2022-02-07 DIAGNOSIS — F05 Delirium due to known physiological condition: Secondary | ICD-10-CM | POA: Diagnosis not present

## 2022-02-07 DIAGNOSIS — R600 Localized edema: Secondary | ICD-10-CM

## 2022-02-07 DIAGNOSIS — I4892 Unspecified atrial flutter: Secondary | ICD-10-CM | POA: Diagnosis present

## 2022-02-07 DIAGNOSIS — J9601 Acute respiratory failure with hypoxia: Secondary | ICD-10-CM | POA: Diagnosis present

## 2022-02-07 DIAGNOSIS — F17210 Nicotine dependence, cigarettes, uncomplicated: Secondary | ICD-10-CM | POA: Diagnosis present

## 2022-02-07 DIAGNOSIS — K219 Gastro-esophageal reflux disease without esophagitis: Secondary | ICD-10-CM | POA: Diagnosis not present

## 2022-02-07 DIAGNOSIS — R0602 Shortness of breath: Secondary | ICD-10-CM | POA: Diagnosis not present

## 2022-02-07 DIAGNOSIS — M7989 Other specified soft tissue disorders: Secondary | ICD-10-CM | POA: Diagnosis not present

## 2022-02-07 DIAGNOSIS — I110 Hypertensive heart disease with heart failure: Secondary | ICD-10-CM | POA: Diagnosis present

## 2022-02-07 DIAGNOSIS — Z90710 Acquired absence of both cervix and uterus: Secondary | ICD-10-CM

## 2022-02-07 DIAGNOSIS — R410 Disorientation, unspecified: Secondary | ICD-10-CM

## 2022-02-07 DIAGNOSIS — Z8249 Family history of ischemic heart disease and other diseases of the circulatory system: Secondary | ICD-10-CM

## 2022-02-07 DIAGNOSIS — I2510 Atherosclerotic heart disease of native coronary artery without angina pectoris: Secondary | ICD-10-CM | POA: Diagnosis present

## 2022-02-07 DIAGNOSIS — K2100 Gastro-esophageal reflux disease with esophagitis, without bleeding: Secondary | ICD-10-CM | POA: Diagnosis not present

## 2022-02-07 DIAGNOSIS — J449 Chronic obstructive pulmonary disease, unspecified: Secondary | ICD-10-CM | POA: Diagnosis present

## 2022-02-07 DIAGNOSIS — E785 Hyperlipidemia, unspecified: Secondary | ICD-10-CM | POA: Diagnosis present

## 2022-02-07 DIAGNOSIS — I5022 Chronic systolic (congestive) heart failure: Secondary | ICD-10-CM

## 2022-02-07 DIAGNOSIS — Z881 Allergy status to other antibiotic agents status: Secondary | ICD-10-CM

## 2022-02-07 DIAGNOSIS — Z9841 Cataract extraction status, right eye: Secondary | ICD-10-CM

## 2022-02-07 DIAGNOSIS — Z79899 Other long term (current) drug therapy: Secondary | ICD-10-CM

## 2022-02-07 DIAGNOSIS — I480 Paroxysmal atrial fibrillation: Secondary | ICD-10-CM

## 2022-02-07 DIAGNOSIS — M810 Age-related osteoporosis without current pathological fracture: Secondary | ICD-10-CM | POA: Diagnosis present

## 2022-02-07 DIAGNOSIS — Z720 Tobacco use: Secondary | ICD-10-CM | POA: Diagnosis not present

## 2022-02-07 LAB — COMPREHENSIVE METABOLIC PANEL
ALT: 22 U/L (ref 0–44)
AST: 26 U/L (ref 15–41)
Albumin: 3.1 g/dL — ABNORMAL LOW (ref 3.5–5.0)
Alkaline Phosphatase: 81 U/L (ref 38–126)
Anion gap: 10 (ref 5–15)
BUN: 24 mg/dL — ABNORMAL HIGH (ref 8–23)
CO2: 27 mmol/L (ref 22–32)
Calcium: 8.5 mg/dL — ABNORMAL LOW (ref 8.9–10.3)
Chloride: 104 mmol/L (ref 98–111)
Creatinine, Ser: 1.02 mg/dL — ABNORMAL HIGH (ref 0.44–1.00)
GFR, Estimated: 56 mL/min — ABNORMAL LOW (ref >60)
Glucose, Bld: 137 mg/dL — ABNORMAL HIGH (ref 70–99)
Potassium: 3.9 mmol/L (ref 3.5–5.1)
Sodium: 141 mmol/L (ref 135–145)
Total Bilirubin: 0.5 mg/dL (ref 0.3–1.2)
Total Protein: 5.8 g/dL — ABNORMAL LOW (ref 6.5–8.1)

## 2022-02-07 LAB — CBC WITH DIFFERENTIAL/PLATELET
Abs Immature Granulocytes: 0.04 10*3/uL (ref 0.00–0.07)
Basophils Absolute: 0.1 10*3/uL (ref 0.0–0.1)
Basophils Relative: 1 %
Eosinophils Absolute: 0.1 10*3/uL (ref 0.0–0.5)
Eosinophils Relative: 2 %
HCT: 31.2 % — ABNORMAL LOW (ref 36.0–46.0)
Hemoglobin: 8.4 g/dL — ABNORMAL LOW (ref 12.0–15.0)
Immature Granulocytes: 1 %
Lymphocytes Relative: 8 %
Lymphs Abs: 0.6 10*3/uL — ABNORMAL LOW (ref 0.7–4.0)
MCH: 24.3 pg — ABNORMAL LOW (ref 26.0–34.0)
MCHC: 26.9 g/dL — ABNORMAL LOW (ref 30.0–36.0)
MCV: 90.4 fL (ref 80.0–100.0)
Monocytes Absolute: 0.6 10*3/uL (ref 0.1–1.0)
Monocytes Relative: 9 %
Neutro Abs: 5.5 10*3/uL (ref 1.7–7.7)
Neutrophils Relative %: 79 %
Platelets: 187 10*3/uL (ref 150–400)
RBC: 3.45 MIL/uL — ABNORMAL LOW (ref 3.87–5.11)
RDW: 22.5 % — ABNORMAL HIGH (ref 11.5–15.5)
WBC: 7 10*3/uL (ref 4.0–10.5)
nRBC: 0.3 % — ABNORMAL HIGH (ref 0.0–0.2)

## 2022-02-07 LAB — TROPONIN I (HIGH SENSITIVITY)
Troponin I (High Sensitivity): 152 ng/L (ref <18)
Troponin I (High Sensitivity): 171 ng/L (ref <18)

## 2022-02-07 LAB — BRAIN NATRIURETIC PEPTIDE: B Natriuretic Peptide: 2535 pg/mL — ABNORMAL HIGH (ref 0.0–100.0)

## 2022-02-07 MED ORDER — ACETAMINOPHEN 325 MG PO TABS
650.0000 mg | ORAL_TABLET | Freq: Four times a day (QID) | ORAL | Status: DC | PRN
  Administered 2022-02-08 – 2022-02-11 (×9): 650 mg via ORAL
  Filled 2022-02-07 (×9): qty 2

## 2022-02-07 MED ORDER — ONDANSETRON HCL 4 MG/2ML IJ SOLN: 4.0000 mg | Freq: Four times a day (QID) | INTRAMUSCULAR | Status: DC | PRN

## 2022-02-07 MED ORDER — ACETAMINOPHEN 650 MG RE SUPP: 650.0000 mg | Freq: Four times a day (QID) | RECTAL | Status: DC | PRN

## 2022-02-07 MED ORDER — ONDANSETRON HCL 4 MG PO TABS: 4.0000 mg | ORAL_TABLET | Freq: Four times a day (QID) | ORAL | Status: DC | PRN

## 2022-02-07 MED ORDER — BISOPROLOL FUMARATE 5 MG PO TABS
5.0000 mg | ORAL_TABLET | Freq: Every day | ORAL | Status: DC
  Administered 2022-02-08 – 2022-02-12 (×5): 5 mg via ORAL
  Filled 2022-02-07 (×5): qty 1

## 2022-02-07 MED ORDER — POLYETHYLENE GLYCOL 3350 17 G PO PACK: 17.0000 g | PACK | Freq: Every day | ORAL | Status: DC | PRN

## 2022-02-07 MED ORDER — FUROSEMIDE 10 MG/ML IJ SOLN
20.0000 mg | Freq: Once | INTRAMUSCULAR | Status: AC
  Administered 2022-02-07: 20 mg via INTRAVENOUS
  Filled 2022-02-07: qty 2

## 2022-02-07 MED ORDER — LOSARTAN POTASSIUM 50 MG PO TABS
25.0000 mg | ORAL_TABLET | Freq: Every day | ORAL | Status: DC
  Administered 2022-02-08 – 2022-02-12 (×5): 25 mg via ORAL
  Filled 2022-02-07 (×5): qty 1

## 2022-02-07 MED ORDER — ENOXAPARIN SODIUM 40 MG/0.4ML IJ SOSY
40.0000 mg | PREFILLED_SYRINGE | INTRAMUSCULAR | Status: DC
  Administered 2022-02-08 – 2022-02-12 (×5): 40 mg via SUBCUTANEOUS
  Filled 2022-02-07 (×5): qty 0.4

## 2022-02-07 MED ORDER — IPRATROPIUM-ALBUTEROL 0.5-2.5 (3) MG/3ML IN SOLN
3.0000 mL | Freq: Once | RESPIRATORY_TRACT | Status: AC
  Administered 2022-02-07: 3 mL via RESPIRATORY_TRACT
  Filled 2022-02-07: qty 3

## 2022-02-07 MED ORDER — TRAZODONE HCL 50 MG PO TABS
100.0000 mg | ORAL_TABLET | Freq: Every evening | ORAL | Status: DC | PRN
  Administered 2022-02-08 – 2022-02-10 (×3): 100 mg via ORAL
  Filled 2022-02-07 (×4): qty 2

## 2022-02-07 MED ORDER — FUROSEMIDE 10 MG/ML IJ SOLN
40.0000 mg | Freq: Two times a day (BID) | INTRAMUSCULAR | Status: DC
  Administered 2022-02-08 – 2022-02-12 (×9): 40 mg via INTRAVENOUS
  Filled 2022-02-07 (×9): qty 4

## 2022-02-07 NOTE — Assessment & Plan Note (Addendum)
Dry weight appears to be 123 ?Presented with weight 247 pounds ?Remains clinically fluid overloaded +JVD ?-Continue IV Lasix 40 twice daily>>d/c home with lasix 40 mg po daily ?-03/26/2019 echo EF 30-35%, diffuse HK, moderate MR, mild TR ?-3/25 echo--EF 35-40, global HK, RVSP 62.1 ?-Strict input output, daily weights, daily BMP ?-continue duo nebs ?-continue Pulmicort ?-continue bisoprolol and losartan ?-NEG 24 lbs ?-discharge weight 123.9lbs ?

## 2022-02-07 NOTE — Assessment & Plan Note (Addendum)
Hemoglobin 8.4 on presentation ?Baseline Hgb 8-9 ?Iron sat 6%, ferritin 14>>nulecit x 1, repeat dose on 3/27 ?B12--943 ?Folate 5.0>>replete ?No active bleeding presently ?

## 2022-02-07 NOTE — ED Provider Notes (Signed)
?[facility] ?Provider Note ? ? ?CSN: [id number] ?Arrival date & time: 02/07/22  1505 ? ?  ? ?History ? ?Chief Complaint  ?Patient presents with  ? Chest Pain  ? ? ?Misty [name] is a 81 y.o. female. ? ?HPI ?81 year old female with a history of systolic heart failure (not currently on diuretics), atrial fibrillation on anticoagulation, chronic anemia, COPD not on home oxygen and other comorbidities presents with shortness of breath.  History is from patient and daughter.  Shortness of breath has been for about a week and her PCP heard some Rales and gave her Levaquin.  Does not seem like that has helped.  She has had a nonproductive cough.  No fevers or chest pain though the daughter endorses she has had some chest tightness.  Since being treated with antibiotics, the daughter states she has now had progressive leg swelling and she could not fit the patient's shoes on her today.  Patient does not typically take diuretics.  She was found to be hypoxic into the high 80s and is not normally on oxygen and was sent here by her PCP. ? ?Home Medications ?Prior to Admission medications   ?Medication Sig Start Date End Date Taking? Authorizing Provider  ?acetaminophen (TYLENOL) 500 MG tablet Take 500 mg by mouth every 8 (eight) hours as needed for mild pain, moderate pain or headache.  01/20/19  Yes [provider]  ?albuterol (VENTOLIN HFA) 108 (90 Base) MCG/ACT inhaler INHALE 2 PUFFS EVERY 6 HOURS AS NEEDED FOR WHEEZING OR SHORTNESS OF BREATH 12/19/21  Yes Stacks, Cletus Gash, MD  ?ALPRAZolam Duanne Moron) 0.5 MG tablet Take 1 tablet (0.5 mg total) by mouth 2 (two) times daily. 01/30/22  Yes Claretta Fraise, MD  ?benzonatate (TESSALON) 200 MG capsule Take 1 capsule (200 mg total) by mouth 3 (three) times daily as needed for cough. 01/30/22  Yes Claretta Fraise, MD  ?bisoprolol (ZEBETA) 5 MG tablet Take 1 tablet (5 mg total) by mouth daily. 03/28/21  Yes Minus Breeding, MD  ?calcium carbonate (TUMS - DOSED IN MG  ELEMENTAL CALCIUM) 500 MG chewable tablet Chew 2 tablets (400 mg of elemental calcium total) by mouth daily. 11/16/20  Yes Rehman, Mechele Dawley, MD  ?cholecalciferol (VITAMIN D) 1000 UNITS tablet Take 2,000 Units by mouth daily.    Yes [provider]  ?cyanocobalamin 1000 MCG tablet Take 1,000 mcg by mouth daily.   Yes [provider]  ?ferrous sulfate 325 (65 FE) MG tablet Take 650 mg by mouth daily with breakfast.   Yes [provider]  ?losartan (COZAAR) 25 MG tablet Take 1 tablet (25 mg total) by mouth daily. 08/22/21  Yes Minus Breeding, MD  ?omeprazole (PRILOSEC) 40 MG capsule Take 1 capsule (40 mg total) by mouth in the morning and at bedtime. 11/21/21  Yes Claretta Fraise, MD  ?traZODone (DESYREL) 150 MG tablet Use from 1/3 to 1 tablet nightly as needed for sleep. 08/06/21  Yes Claretta Fraise, MD  ?B Complex-C-Folic Acid (B COMPLEX-VITAMIN C-FOLIC ACID) 1 MG tablet Take 1 tablet by mouth daily with breakfast. ?Patient not taking: Reported on 02/07/2022 11/16/20   Rogene Houston, MD  ?levofloxacin (LEVAQUIN) 500 MG tablet Take 1 tablet (500 mg total) by mouth daily. For 10 days ?Patient not taking: Reported on 02/07/2022 01/30/22   Claretta Fraise, MD  ?   ? ?Allergies    ?Codeine, Tramadol, Asa [aspirin], Azithromycin, Celebrex [celecoxib], Cymbalta [duloxetine hcl], Prednisone, Vioxx [rofecoxib], Zelnorm [tegaserod], and Zocor [simvastatin]   ? ?  Review of Systems   ?Review of Systems  ?Constitutional:  Negative for fever.  ?HENT:  Negative for sore throat.   ?Respiratory:  Positive for cough, chest tightness and shortness of breath.   ?Cardiovascular:  Positive for leg swelling. Negative for chest pain.  ?Gastrointestinal:  Negative for abdominal pain.  ? ?Physical Exam ?Updated Vital Signs ?BP 122/82 (BP Location: Left Arm)   Pulse 86   Temp 98.3 ?F (36.8 ?C) (Oral)   Resp 15   SpO2 99%  ?Physical Exam ?Vitals and nursing note reviewed.  ?Constitutional:   ?   General: She is not in  acute distress. ?   Appearance: She is well-developed. She is not ill-appearing or diaphoretic.  ?HENT:  ?   Head: Normocephalic and atraumatic.  ?Cardiovascular:  ?   Rate and Rhythm: Normal rate. Rhythm irregular.  ?   Heart sounds: Normal heart sounds.  ?Pulmonary:  ?   Effort: Pulmonary effort is normal. No accessory muscle usage or respiratory distress.  ?   Breath sounds: Wheezing and rales present.  ?Abdominal:  ?   Palpations: Abdomen is soft.  ?   Tenderness: There is no abdominal tenderness.  ?Musculoskeletal:  ?   Right lower leg: Edema present.  ?   Left lower leg: Edema present.  ?   Comments: Pitting edema from feet to knees bilaterally  ?Skin: ?   General: Skin is warm and dry.  ?Neurological:  ?   Mental Status: She is alert.  ? ? ?ED Results / Procedures / Treatments   ?Labs ?(all labs ordered are listed, but only abnormal results are displayed) ?Labs Reviewed  ?COMPREHENSIVE METABOLIC PANEL - Abnormal; Notable for the following components:  ?    Result Value  ? Glucose, Bld 137 (*)   ? BUN 24 (*)   ? Creatinine, Ser 1.02 (*)   ? Calcium 8.5 (*)   ? Total Protein 5.8 (*)   ? Albumin 3.1 (*)   ? GFR, Estimated 56 (*)   ? All other components within normal limits  ?BRAIN NATRIURETIC PEPTIDE - Abnormal; Notable for the following components:  ? B Natriuretic Peptide 2,535.0 (*)   ? All other components within normal limits  ?CBC WITH DIFFERENTIAL/PLATELET - Abnormal; Notable for the following components:  ? RBC 3.45 (*)   ? Hemoglobin 8.4 (*)   ? HCT 31.2 (*)   ? MCH 24.3 (*)   ? MCHC 26.9 (*)   ? RDW 22.5 (*)   ? nRBC 0.3 (*)   ? Lymphs Abs 0.6 (*)   ? All other components within normal limits  ?TROPONIN I (HIGH SENSITIVITY) - Abnormal; Notable for the following components:  ? Troponin I (High Sensitivity) 152 (*)   ? All other components within normal limits  ?TROPONIN I (HIGH SENSITIVITY) - Abnormal; Notable for the following components:  ? Troponin I (High Sensitivity) 171 (*)   ? All other  components within normal limits  ?RESP PANEL BY RT-PCR (FLU A&B, COVID) [id number]  ?BASIC METABOLIC PANEL  ?CBC  ? ? ?EKG ?EKG Interpretation ? ?Date/Time:  Thursday February 07 2022 15:23:30 EDT ?Ventricular Rate:  122 ?PR Interval:  104 ?QRS Duration: 96 ?QT Interval:  330 ?QTC Calculation: 470 ?R Axis:   -71 ?Text Interpretation: Unusual P axis and short PR, probable junctional tachycardia Left axis deviation Pulmonary disease pattern  overall similar to Dec 2021 Confirmed by Sherwood Gambler [phone]) on 02/07/2022 3:32:54 PM ? ?Radiology ?DG Chest 2 View ? ?Result  Date: 02/07/2022 ?CLINICAL DATA:  Chest pain and short of breath.  Leg swelling EXAM: CHEST - 2 VIEW COMPARISON:  10/30/2020 FINDINGS: Cardiac enlargement. Mild vascular congestion. Underlying chronic lung disease based on the prior study. Negative for edema. Small bilateral pleural effusions and mild bibasilar atelectasis. Cement vertebral augmentation at multiple levels in the thoracic and lumbar spine. Limited bony detail due to osteopenia. IMPRESSION: Cardiac enlargement without edema. Mild bibasilar atelectasis and small effusions. Electronically Signed   By: Franchot Gallo M.D.   On: 02/07/2022 16:50   ? ?Procedures ?Procedures  ? ? ?Medications Ordered in ED ?Medications  ?furosemide (LASIX) injection 40 mg (has no administration in time range)  ?enoxaparin (LOVENOX) injection 40 mg (has no administration in time range)  ?acetaminophen (TYLENOL) tablet 650 mg (has no administration in time range)  ?  Or  ?acetaminophen (TYLENOL) suppository 650 mg (has no administration in time range)  ?ondansetron (ZOFRAN) tablet 4 mg (has no administration in time range)  ?  Or  ?ondansetron (ZOFRAN) injection 4 mg (has no administration in time range)  ?polyethylene glycol (MIRALAX / GLYCOLAX) packet 17 g (has no administration in time range)  ?bisoprolol (ZEBETA) tablet 5 mg (has no administration in time range)  ?losartan (COZAAR) tablet 25 mg (has no administration  in time range)  ?traZODone (DESYREL) tablet 100 mg (has no administration in time range)  ?furosemide (LASIX) injection 20 mg (20 mg Intravenous Given 02/07/22 1705)  ?ipratropium-albuterol (DUONEB) 0.5-2.5 (3) MG

## 2022-02-07 NOTE — Progress Notes (Signed)
? ?Acute Office Visit ? ?Subjective:  ? ? Patient ID: Misty [name], female    DOB: 11/16/1941, 81 y.o.   MRN: [id number] ? ?Chief Complaint  ?Patient presents with  ? Shortness of Breath  ? Weakness  ? Edema  ? ? ?HPI ?Here with daughter in law. Patient is in today for shortness of breath and generalized weakness that has worsened for the last 5 days. This initially started about 10 days ago. She was seen by her PCP and was started on Levaquin. She also reports worsening edema in lower legs since. Denies fever. Denies urinary symptoms. 5 lbs gain in 1 week, reports 16 lb gain over about 2 weeks. She has a history of HF, a. Fib, anemia, cardiomyopathy,  and pulmonary fibrosis. She has been staying well hydrated. Denies confusion or focal weakness.  ? ?Past Medical History:  ?Diagnosis Date  ? Anxiety   ? takes Xanax daily  ? Arthritis   ? back  ? Atrial fibrillation with RVR ([place]) 03/25/2019  ? Cataract   ? Chronic back pain   ? Colitis   ? Constipation   ? OTC stool softener prn  ? COPD (chronic obstructive pulmonary disease) ([place])   ? Depression   ? Diverticulosis   ? Educated about COVID-19 virus infection 04/07/2019  ? Elevated troponin 03/25/2019  ? GERD (gastroesophageal reflux disease)   ? takes Ranidine daily  ? H/O kyphoplasty 01/14/2019  ? Hematochezia 02/26/2015  ? Hemorrhoids   ? History of bronchitis   ? History of migraine   ? many yrs ago  ? History of shingles   ? Hyperlipidemia   ? Hypokalemia 01/10/2019  ? Insomnia   ? Migraines   ? Nontraumatic compression fracture of T11 vertebra ([facility]) 07/08/2014  ? Osteoporosis   ? PONV (postoperative nausea and vomiting)   ? ? ?Past Surgical History:  ?Procedure Laterality Date  ? ABDOMINAL HYSTERECTOMY    ? BIOPSY  07/12/2019  ? Procedure: BIOPSY;  Surgeon: Rogene Houston, MD;  [facility];  Service: Endoscopy;;  duodenum  ? BIOPSY  10/31/2020  ? Procedure: BIOPSY;  Surgeon: Harvel Quale, MD;  [facility]: [facility];  Service:  Gastroenterology;;  ? COLONOSCOPY    ? COLONOSCOPY N/A 02/17/2015  ? Procedure: COLONOSCOPY;  Surgeon: Rogene Houston, MD;  [facility];  Service: Endoscopy;  Laterality: N/A;  110n - moved to 11:15 - Ann to notify pt  ? COLONOSCOPY N/A 07/12/2019  ? rehman: - Diverticulosis in the sigmoid colon and in the descending colon., external and internal hemorrhoids, no specimens collected  ? ESOPHAGOGASTRODUODENOSCOPY    ? ESOPHAGOGASTRODUODENOSCOPY N/A 01/29/2016  ? Procedure: ESOPHAGOGASTRODUODENOSCOPY (EGD);  Surgeon: Rogene Houston, MD;  [facility];  Service: Endoscopy;  Laterality: N/A;  ? ESOPHAGOGASTRODUODENOSCOPY N/A 10/14/2017  ? Procedure: ESOPHAGOGASTRODUODENOSCOPY (EGD);  Surgeon: Rogene Houston, MD;  [facility];  Service: Endoscopy;  Laterality: N/A;  730  ? ESOPHAGOGASTRODUODENOSCOPY N/A 07/12/2019  ? Procedure: ESOPHAGOGASTRODUODENOSCOPY (EGD);  Surgeon: Rogene Houston, MD;  [facility];  Service: Endoscopy;  Laterality: N/A;  ? ESOPHAGOGASTRODUODENOSCOPY N/A 10/03/2019  ? Procedure: ESOPHAGOGASTRODUODENOSCOPY (EGD);  Surgeon: Rogene Houston, MD;  [facility];  Service: Endoscopy;  Laterality: N/A;  ? ESOPHAGOGASTRODUODENOSCOPY (EGD) WITH PROPOFOL N/A 10/31/2020  ? Rehman: - LA Grade D esophagitis with no bleeding. 2cm HH, two polyps below GE junction, small amount of resude in stomach,gastric antral vascular ectasia w/o bleeding,  treated with APC. scalloped mucosa found in duodenum, consistent with celiac disease  ? EYE SURGERY Right 01/2014  ? cataracts  ? GIVENS CAPSULE STUDY N/A 07/29/2019  ? Procedure: GIVENS CAPSULE STUDY;  Surgeon: Rogene Houston, MD;  [facility];  Service: Endoscopy;  Laterality: N/A;  ? GIVENS CAPSULE STUDY N/A 08/20/2019  ? Procedure: GIVENS CAPSULE STUDY;  Surgeon: Rogene Houston, MD;  [facility];  Service: Endoscopy;  Laterality: N/A;  730am  ? GIVENS CAPSULE STUDY  10/03/2019  ?  Procedure: GIVENS CAPSULE STUDY;  Surgeon: Rogene Houston, MD;  [facility];  Service: Endoscopy;;  ? HOT HEMOSTASIS  10/31/2020  ? Procedure: HOT HEMOSTASIS (ARGON PLASMA COAGULATION/BICAP);  Surgeon: Montez Morita, Quillian Quince, MD;  [facility];  Service: Gastroenterology;;  ? INTRAMEDULLARY (IM) NAIL INTERTROCHANTERIC Left 01/07/2019  ? Procedure: INTRAMEDULLARY (IM) NAIL INTERTROCHANTRIC;  Surgeon: Carole Civil, MD;  Location: [facility];  Service: Orthopedics;  Laterality: Left;  ? KYPHOPLASTY N/A 07/08/2014  ? Procedure: Thoracic Eleven Kyphoplasty;  Surgeon: Hosie Spangle, MD;  Location: [facility];  Service: Neurosurgery;  Laterality: N/A;  ? left hip surgery    ? LUMBAR LAMINECTOMY/DECOMPRESSION MICRODISCECTOMY Bilateral 05/20/2013  ? Procedure: LUMBAR LAMINECTOMY/DECOMPRESSION MICRODISCECTOMY 1 LEVEL;  Surgeon: Hosie Spangle, MD;  Location: [facility];  Service: Neurosurgery;  Laterality: Bilateral;  Bilateral Lumbar four-five laminotomy and left lumbar four-five microdiskectomy  ? RIGHT/LEFT HEART CATH AND CORONARY ANGIOGRAPHY N/A 03/31/2019  ? Procedure: RIGHT/LEFT HEART CATH AND CORONARY ANGIOGRAPHY;  Surgeon: Sherren Mocha, MD;  Location: [facility];  Service: Cardiovascular;  Laterality: N/A;  ? SPINE SURGERY    ? Dr Carloyn Manner -  vertebraplasty  ? ? ?Family History  ?Problem Relation Age of Onset  ? Hypertension Mother   ? Heart attack Mother   ? Macular degeneration Father   ? Heart disease Father   ? Cirrhosis Son   ? Heart disease Son   ? Colon cancer Neg Hx   ? ? ?Social History  ? ?Socioeconomic History  ? Marital status: Married  ?  Spouse name: Sterling Big   ? Number of children: 1  ? Years of education: Not on file  ? Highest education level: Not on file  ?Occupational History  ? Occupation: retired   ?  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 Smoking status: Every Day  ?  Packs/day: 1.00  ?  Years: 52.00  ?  Pack years: 52.00  ?  Types:  Cigarettes  ?  Start date: 11/18/1958  ?  Last attempt to quit: 08/20/2011  ?  Years since quitting: 10.4  ? Smokeless tobacco: Never  ? Tobacco comments:  ?  1/2-1pack off and on all her life  ?Vaping Use  ? Vaping Use: Never used  ?Substance and Sexual Activity  ? Alcohol use: No  ?  Alcohol/week: 0.0 standard drinks  ? Drug use: No  ? Sexual activity: Not Currently  ?Other Topics Concern  ? Not on file  ?Social History Narrative  ? Lives at home with husband, Sterling Big. Had one son- now deceased   ? 3 grandsons and 4 great grandsons - they help her a lot  ? Daughter in law helps a lot as well  ? ?Social Determinants of Health  ? ?Financial Resource Strain: Low Risk   ? Difficulty of Paying Living Expenses: Not hard at all  ?Food Insecurity: No Food  Insecurity  ? Worried About Charity fundraiser in the Last Year: Never true  ? Ran Out of Food in the Last Year: Never true  ?Transportation Needs: No Transportation Needs  ? Lack of Transportation (Medical): No  ? Lack of Transportation (Non-Medical): No  ?Physical Activity: Inactive  ? Days of Exercise per Week: 0 days  ? Minutes of Exercise per Session: 0 min  ?Stress: No Stress Concern Present  ? Feeling of Stress : Only a little  ?Social Connections: Moderately Isolated  ? Frequency of Communication with Friends and Family: More than three times a week  ? Frequency of Social Gatherings with Friends and Family: Twice a week  ? Attends Religious Services: Never  ? Active Member of Clubs or Organizations: No  ? Attends Archivist Meetings: Never  ? Marital Status: Married  ?Intimate Partner Violence: Not At Risk  ? Fear of Current or Ex-Partner: No  ? Emotionally Abused: No  ? Physically Abused: No  ? Sexually Abused: No  ? ? ?Outpatient Medications Prior to Visit  ?Medication Sig Dispense Refill  ? acetaminophen (TYLENOL) 500 MG tablet Take 500 mg by mouth every 8 (eight) hours as needed for mild pain, moderate pain or headache.     ? albuterol (VENTOLIN HFA)  108 (90 Base) MCG/ACT inhaler INHALE 2 PUFFS EVERY 6 HOURS AS NEEDED FOR WHEEZING OR SHORTNESS OF BREATH 8.5 g 4  ? ALPRAZolam (XANAX) 0.5 MG tablet Take 1 tablet (0.5 mg total) by mouth 2 (two) times daily. 60 tabl

## 2022-02-07 NOTE — Assessment & Plan Note (Addendum)
O2 sats 87% on room air.  ?Secondary to pulmonary edema and COPD ?Stable on 2L ?Wean for saturation >92% ?Personally reviewed CXR--chronic interstitial markings ?-Strict input output, daily weights, daily BMP ?-continue duo nebs ?-continue Pulmicort ?Patient will be set up with home oxygen>>3L ?

## 2022-02-07 NOTE — H&P (Signed)
?History and Physical  ? ? ?Misty [name] KDT:[id number] DOB: 1941-03-19 DOA: 02/07/2022 ? ?PCP: Claretta Fraise, MD  ? ?Patient coming from: Home ? ?I have personally briefly reviewed patient's old medical records in [place] ? ?Chief Complaint: Difficulty breathing swelling  ? ?HPI: Misty [name] is a 81 y.o. female with medical history significant for COPD, atrial fibrillation, systolic CHF, pulmonary fibrosis, anxiety and depression. ?Patient presented to the ED with complaints of difficulty breathing, generalized weakness, and swelling of bilateral legs.  She was seen 3/15 thought to have pneumonia and prescribed Levaquin.  Patient reports baseline mild lower extremity swelling, but over the past 4 days the swelling significantly worsened.  She reports abdominal bloating.  No chest pain.  Cough started in the ED today.  To be on 20 mg of Lasix, but this was discontinued by outpatient provider. ? ?Patient went to see a outpatient provider today and was referred to the ED, O2 sats were 88% on room air. ? ?Patient lives with her husband, ex daughter-in-law- Misty [name] [employer])  at bedside, checks on patient daily. ? ?ED Course: Temperature 98.4.  Heart rate 80s to 90s.  Respiratory 21-23.  Blood pressure systolic 1 99-8 38.  O2 sat 87% on room air. ?BNP markedly elevated at 2535.  Troponin 157 chest x-ray shows cardiomegaly, with mild vascular congestion and small bilateral pleural effusions. ?IV Lasix 20 mg x 1 given. ? ?Review of Systems: As per HPI all other systems reviewed and negative. ? ?Past Medical History:  ?Diagnosis Date  ? Anxiety   ? takes Xanax daily  ? Arthritis   ? back  ? Atrial fibrillation with RVR ([place]) 03/25/2019  ? Cataract   ? Chronic back pain   ? Colitis   ? Constipation   ? OTC stool softener prn  ? COPD (chronic obstructive pulmonary disease) ([place])   ? Depression   ? Diverticulosis   ? Educated about COVID-19 virus infection 04/07/2019  ? Elevated troponin 03/25/2019  ? GERD  (gastroesophageal reflux disease)   ? takes Ranidine daily  ? H/O kyphoplasty 01/14/2019  ? Hematochezia 02/26/2015  ? Hemorrhoids   ? History of bronchitis   ? History of migraine   ? many yrs ago  ? History of shingles   ? Hyperlipidemia   ? Hypokalemia 01/10/2019  ? Insomnia   ? Migraines   ? Nontraumatic compression fracture of T11 vertebra (HCC) 07/08/2014  ? Osteoporosis   ? PONV (postoperative nausea and vomiting)   ? ? ?Past Surgical History:  ?Procedure Laterality Date  ? ABDOMINAL HYSTERECTOMY    ? BIOPSY  07/12/2019  ? Procedure: BIOPSY;  Surgeon: Rogene Houston, MD;  [facility];  Service: Endoscopy;;  duodenum  ? BIOPSY  10/31/2020  ? Procedure: BIOPSY;  Surgeon: Harvel Quale, MD;  [facility];  Service: Gastroenterology;;  ? COLONOSCOPY    ? COLONOSCOPY N/A 02/17/2015  ? Procedure: COLONOSCOPY;  Surgeon: Rogene Houston, MD;  [facility];  Service: Endoscopy;  Laterality: N/A;  110n - moved to 11:15 - Ann to notify pt  ? COLONOSCOPY N/A 07/12/2019  ? rehman: - Diverticulosis in the sigmoid colon and in the descending colon., external and internal hemorrhoids, no specimens collected  ? ESOPHAGOGASTRODUODENOSCOPY    ? ESOPHAGOGASTRODUODENOSCOPY N/A 01/29/2016  ? Procedure: ESOPHAGOGASTRODUODENOSCOPY (EGD);  Surgeon: Rogene Houston, MD;  [facility];  Service: Endoscopy;  Laterality: N/A;  ? ESOPHAGOGASTRODUODENOSCOPY N/A 10/14/2017  ?  Procedure: ESOPHAGOGASTRODUODENOSCOPY (EGD);  Surgeon: Rogene Houston, MD;  [facility];  Service: Endoscopy;  Laterality: N/A;  730  ? ESOPHAGOGASTRODUODENOSCOPY N/A 07/12/2019  ? Procedure: ESOPHAGOGASTRODUODENOSCOPY (EGD);  Surgeon: Rogene Houston, MD;  [facility];  Service: Endoscopy;  Laterality: N/A;  ? ESOPHAGOGASTRODUODENOSCOPY N/A 10/03/2019  ? Procedure: ESOPHAGOGASTRODUODENOSCOPY (EGD);  Surgeon: Rogene Houston, MD;  [facility];  Service: Endoscopy;   Laterality: N/A;  ? ESOPHAGOGASTRODUODENOSCOPY (EGD) WITH PROPOFOL N/A 10/31/2020  ? Rehman: - LA Grade D esophagitis with no bleeding. 2cm HH, two polyps below GE junction, small amount of resude in stomach,gastric antral vascular ectasia w/o bleeding, treated with APC. scalloped mucosa found in duodenum, consistent with celiac disease  ? EYE SURGERY Right 01/2014  ? cataracts  ? GIVENS CAPSULE STUDY N/A 07/29/2019  ? Procedure: GIVENS CAPSULE STUDY;  Surgeon: Rogene Houston, MD;  [facility];  Service: Endoscopy;  Laterality: N/A;  ? GIVENS CAPSULE STUDY N/A 08/20/2019  ? Procedure: GIVENS CAPSULE STUDY;  Surgeon: Rogene Houston, MD;  [facility];  Service: Endoscopy;  Laterality: N/A;  730am  ? GIVENS CAPSULE STUDY  10/03/2019  ? Procedure: GIVENS CAPSULE STUDY;  Surgeon: Rogene Houston, MD;  [facility];  Service: Endoscopy;;  ? HOT HEMOSTASIS  10/31/2020  ? Procedure: HOT HEMOSTASIS (ARGON PLASMA COAGULATION/BICAP);  Surgeon: Montez Morita, Quillian Quince, MD;  [facility];  Service: Gastroenterology;;  ? INTRAMEDULLARY (IM) NAIL INTERTROCHANTERIC Left 01/07/2019  ? Procedure: INTRAMEDULLARY (IM) NAIL INTERTROCHANTRIC;  Surgeon: Carole Civil, MD;  [facility];  Service: Orthopedics;  Laterality: Left;  ? KYPHOPLASTY N/A 07/08/2014  ? Procedure: Thoracic Eleven Kyphoplasty;  Surgeon: Hosie Spangle, MD;  Location: [facility] [place] [facility];  Service: Neurosurgery;  Laterality: N/A;  ? left hip surgery    ? LUMBAR LAMINECTOMY/DECOMPRESSION MICRODISCECTOMY Bilateral 05/20/2013  ? Procedure: LUMBAR LAMINECTOMY/DECOMPRESSION MICRODISCECTOMY 1 LEVEL;  Surgeon: Hosie Spangle, MD;  Location: [place] [facility];  Service: Neurosurgery;  Laterality: Bilateral;  Bilateral Lumbar four-five laminotomy and left lumbar four-five microdiskectomy  ? RIGHT/LEFT HEART CATH AND CORONARY ANGIOGRAPHY N/A 03/31/2019  ? Procedure: RIGHT/LEFT HEART CATH AND CORONARY ANGIOGRAPHY;   Surgeon: Sherren Mocha, MD;  Location: [facility];  Service: Cardiovascular;  Laterality: N/A;  ? SPINE SURGERY    ? Dr Carloyn Manner -  vertebraplasty  ? ? ? reports that she has been smoking cigarettes. She started smoking about 63 years ago. She has a 52.00 pack-year smoking history. She has never used smokeless tobacco. She reports that she does not drink alcohol and does not use drugs. ? ?Allergies  ?Allergen Reactions  ? Codeine Nausea And Vomiting  ? Tramadol Nausea And Vomiting  ? Asa [Aspirin] Other (See Comments)  ?  Patient is unaware of allergy  ? Azithromycin Other (See Comments)  ?  Patient is unaware of allergy  ? Celebrex [Celecoxib] Other (See Comments)  ?  Irritated stomach ?  ? Cymbalta [Duloxetine Hcl] Other (See Comments)  ?  Felt groggy  ? Prednisone Rash  ?  She has seen the podiatrist and he had injected cortisone in her feet and she had also taken a peel at home. She had a severe reaction to her face with a rash and had to take Benadryl to resolve the symptom. She actually did get more cortisone shots, but no additional reactions to the shots.  ? Vioxx [Rofecoxib] Other (See Comments)  ?  Unknown  ?  Zelnorm [Tegaserod] Other (See Comments)  ?  Patient is unaware of allergy  ? Zocor [Simvastatin] Other (See Comments)  ?  Patient is unaware of allergy  ? ? ?Family History  ?Problem Relation Age of Onset  ? Hypertension Mother   ? Heart attack Mother   ? Macular degeneration Father   ? Heart disease Father   ? Cirrhosis Son   ? Heart disease Son   ? Colon cancer Neg Hx   ? ?Prior to Admission medications   ?Medication Sig Start Date End Date Taking? Authorizing Provider  ?acetaminophen (TYLENOL) 500 MG tablet Take 500 mg by mouth every 8 (eight) hours as needed for mild pain, moderate pain or headache.  01/20/19   [provider]  ?albuterol (VENTOLIN HFA) 108 (90 Base) MCG/ACT inhaler INHALE 2 PUFFS EVERY 6 HOURS AS NEEDED FOR WHEEZING OR SHORTNESS OF BREATH 12/19/21   Claretta Fraise, MD   ?ALPRAZolam Duanne Moron) 0.5 MG tablet Take 1 tablet (0.5 mg total) by mouth 2 (two) times daily. 01/30/22   Claretta Fraise, MD  ?B Complex-C-Folic Acid (B COMPLEX-VITAMIN C-FOLIC ACID) 1 MG tablet Take 1 tablet by mouth daily with

## 2022-02-07 NOTE — Assessment & Plan Note (Signed)
EKG shows regular rhythm, P waves not quite apparent.  Was previously on anticoagulation, discontinued after GI bleeds. ?-Resume bisoprolol 5 mg daily ?

## 2022-02-07 NOTE — ED Triage Notes (Signed)
Chest pain with shortness of breath ?

## 2022-02-07 NOTE — ED Notes (Signed)
Purewik was placed by this NT @ 1720 ?Pt was put in a gown  ?

## 2022-02-08 ENCOUNTER — Inpatient Hospital Stay (HOSPITAL_COMMUNITY): Payer: Medicare Other

## 2022-02-08 DIAGNOSIS — I480 Paroxysmal atrial fibrillation: Secondary | ICD-10-CM

## 2022-02-08 DIAGNOSIS — I5043 Acute on chronic combined systolic (congestive) and diastolic (congestive) heart failure: Secondary | ICD-10-CM

## 2022-02-08 DIAGNOSIS — J9601 Acute respiratory failure with hypoxia: Secondary | ICD-10-CM | POA: Diagnosis not present

## 2022-02-08 DIAGNOSIS — R778 Other specified abnormalities of plasma proteins: Secondary | ICD-10-CM | POA: Diagnosis not present

## 2022-02-08 DIAGNOSIS — F17200 Nicotine dependence, unspecified, uncomplicated: Secondary | ICD-10-CM

## 2022-02-08 DIAGNOSIS — F419 Anxiety disorder, unspecified: Secondary | ICD-10-CM | POA: Diagnosis not present

## 2022-02-08 DIAGNOSIS — I2489 Other forms of acute ischemic heart disease: Secondary | ICD-10-CM

## 2022-02-08 DIAGNOSIS — Z720 Tobacco use: Secondary | ICD-10-CM

## 2022-02-08 DIAGNOSIS — I5021 Acute systolic (congestive) heart failure: Secondary | ICD-10-CM

## 2022-02-08 DIAGNOSIS — I248 Other forms of acute ischemic heart disease: Secondary | ICD-10-CM

## 2022-02-08 LAB — ECHOCARDIOGRAM COMPLETE
AR max vel: 2.48 cm2
AV Area VTI: 2.6 cm2
AV Area mean vel: 2.33 cm2
AV Mean grad: 3.7 mmHg
AV Peak grad: 6.5 mmHg
Ao pk vel: 1.27 m/s
Area-P 1/2: 6.37 cm2
Height: 67 in
MV VTI: 2.3 cm2
S' Lateral: 3.9 cm
Weight: 2366.86 oz

## 2022-02-08 LAB — BASIC METABOLIC PANEL
Anion gap: 8 (ref 5–15)
BUN: 23 mg/dL (ref 8–23)
CO2: 33 mmol/L — ABNORMAL HIGH (ref 22–32)
Calcium: 8.2 mg/dL — ABNORMAL LOW (ref 8.9–10.3)
Chloride: 101 mmol/L (ref 98–111)
Creatinine, Ser: 1.03 mg/dL — ABNORMAL HIGH (ref 0.44–1.00)
GFR, Estimated: 55 mL/min — ABNORMAL LOW (ref >60)
Glucose, Bld: 119 mg/dL — ABNORMAL HIGH (ref 70–99)
Potassium: 3.8 mmol/L (ref 3.5–5.1)
Sodium: 142 mmol/L (ref 135–145)

## 2022-02-08 LAB — CBC
HCT: 31.9 % — ABNORMAL LOW (ref 36.0–46.0)
Hemoglobin: 8.5 g/dL — ABNORMAL LOW (ref 12.0–15.0)
MCH: 24.1 pg — ABNORMAL LOW (ref 26.0–34.0)
MCHC: 26.6 g/dL — ABNORMAL LOW (ref 30.0–36.0)
MCV: 90.4 fL (ref 80.0–100.0)
Platelets: 160 10*3/uL (ref 150–400)
RBC: 3.53 MIL/uL — ABNORMAL LOW (ref 3.87–5.11)
RDW: 22.5 % — ABNORMAL HIGH (ref 11.5–15.5)
WBC: 7.1 10*3/uL (ref 4.0–10.5)
nRBC: 0 % (ref 0.0–0.2)

## 2022-02-08 MED ORDER — LEVALBUTEROL HCL 0.63 MG/3ML IN NEBU
0.6300 mg | INHALATION_SOLUTION | Freq: Two times a day (BID) | RESPIRATORY_TRACT | Status: DC
  Administered 2022-02-09 – 2022-02-12 (×6): 0.63 mg via RESPIRATORY_TRACT
  Filled 2022-02-08 (×7): qty 3

## 2022-02-08 MED ORDER — MELATONIN 3 MG PO TABS
6.0000 mg | ORAL_TABLET | Freq: Once | ORAL | Status: AC
  Administered 2022-02-08: 6 mg via ORAL
  Filled 2022-02-08: qty 2

## 2022-02-08 MED ORDER — IPRATROPIUM BROMIDE 0.02 % IN SOLN
0.5000 mg | Freq: Two times a day (BID) | RESPIRATORY_TRACT | Status: DC
  Administered 2022-02-09 – 2022-02-12 (×6): 0.5 mg via RESPIRATORY_TRACT
  Filled 2022-02-08 (×7): qty 2.5

## 2022-02-08 MED ORDER — IPRATROPIUM BROMIDE 0.02 % IN SOLN
0.5000 mg | Freq: Four times a day (QID) | RESPIRATORY_TRACT | Status: DC
  Administered 2022-02-08 (×2): 0.5 mg via RESPIRATORY_TRACT
  Filled 2022-02-08 (×2): qty 2.5

## 2022-02-08 MED ORDER — BUDESONIDE 0.5 MG/2ML IN SUSP
0.5000 mg | Freq: Two times a day (BID) | RESPIRATORY_TRACT | Status: DC
Start: 2022-02-08 — End: 2022-02-12
  Administered 2022-02-08 – 2022-02-12 (×6): 0.5 mg via RESPIRATORY_TRACT
  Filled 2022-02-08 (×6): qty 2

## 2022-02-08 MED ORDER — LIVING BETTER WITH HEART FAILURE BOOK: Freq: Once | Status: AC

## 2022-02-08 MED ORDER — LEVALBUTEROL HCL 0.63 MG/3ML IN NEBU
0.6300 mg | INHALATION_SOLUTION | Freq: Four times a day (QID) | RESPIRATORY_TRACT | Status: DC
  Administered 2022-02-08 (×2): 0.63 mg via RESPIRATORY_TRACT
  Filled 2022-02-08 (×2): qty 3

## 2022-02-08 MED ORDER — ALPRAZOLAM 0.5 MG PO TABS
0.5000 mg | ORAL_TABLET | Freq: Two times a day (BID) | ORAL | Status: DC | PRN
  Administered 2022-02-08 – 2022-02-09 (×3): 0.5 mg via ORAL
  Filled 2022-02-08 (×4): qty 1

## 2022-02-08 NOTE — Assessment & Plan Note (Addendum)
Troponins 152>> 171 ?Secondary to demand ischemia in the setting of CHF exacerbation ?No recent chest pain ?Repeat echo as above ?Listed allergy to aspirin and intolerance to statins ?

## 2022-02-08 NOTE — Assessment & Plan Note (Signed)
Tobacco cessation discussed ?

## 2022-02-08 NOTE — Consult Note (Addendum)
?Cardiology Consultation:  ? ?Patient ID: Misty [name] ?MRN: [id number]; DOB: 24-Feb-1941 ? ?Admit date: 02/07/2022 ?Date of Consult: 02/08/2022 ? ?PCP:  Claretta Fraise, MD ?  ?[facility] Providers ?Cardiologist:  Minus Breeding, MD      ? ? ?Patient Profile:  ? ?Misty ULLMER is a 81 y.o. female with a hx of HFrEF (EF 30-35% in 03/2019 with cath showing mild nonobstructive CAD), paroxysmal atrial fibrillation (failed anticoagulation in the past due to worsening anemia and not interested in [place]), anemia, HLD, migraines, diverticulosis and tobacco use who is being seen 02/08/2022 for the evaluation of CHF at the request of Dr. Carles Collet. ? ?History of Present Illness:  ? ?Ms. Mitter was last examined by Dr. Percival Spanish in 08/2021 and denied any recent chest pain or palpitations at that time. She did report being weak and was having to take care of her husband who is deaf and has dementia. By review of his note, she was hesitant to make medication changes but was in agreement to titrate Losartan to 25 mg daily and was continued on Bisoprolol 5 mg daily. She had been intolerant to anticoagulation and was not interested in the [place] device. ? ?She [facility] ED on [facility]07[facility]2022 for evaluation of worsening dyspnea for the past week. In talking with the patient today, she reports she has experienced worsening dyspnea on exertion and lower extremity edema for the past 2 to 3 weeks. She felt very weak in her legs as well and her husband would follow behind her as she was worried she would fall. She also reports having orthopnea and PND and was using her inhaler with minimal improvement in symptoms. Denies any recent chest pain or palpitations. ? ?Initial labs showed WBC 7.0, Hgb 8.4, platelets 187, Na+ 141, K+ 3.9 and creatinine 1.02. BNP 2535. Initial Hs Troponin 152 with repeat of 171. CXR showing cardiac enlargement without edema and mild bibasilar atelectasis with small effusions. EKG shows a  narrow-complex tachycardia, HR 122 and likely most consistent with sinus tachycardia with short-PR interval.  ? ?She received IV Lasix 67m x2 while in the ED and has been started on IV Lasix 447mBID. Recorded output of -700 mL thus far. Weight at 147 lbs this AM (recorded at 123 lbs during her office visit in 08/2021). Repeat labs this AM show her renal function remains stable with creatinine at 1.03 and K+ at 3.8. ? ? ?Past Medical History:  ?Diagnosis Date  ? Anxiety   ? takes Xanax daily  ? Arthritis   ? back  ? Atrial fibrillation with RVR ([place]5/05/2019  ? Cataract   ? Chronic back pain   ? Colitis   ? Constipation   ? OTC stool softener prn  ? COPD (chronic obstructive pulmonary disease) ([place]  ? Depression   ? Diverticulosis   ? Educated about COVID-19 virus infection 04/07/2019  ? Elevated troponin 03/25/2019  ? GERD (gastroesophageal reflux disease)   ? takes Ranidine daily  ? H/O kyphoplasty 01/14/2019  ? Hematochezia 02/26/2015  ? Hemorrhoids   ? History of bronchitis   ? History of migraine   ? many yrs ago  ? History of shingles   ? Hyperlipidemia   ? Hypokalemia 01/10/2019  ? Insomnia   ? Migraines   ? Nontraumatic compression fracture of T11 vertebra (HCC) 07/08/2014  ? Osteoporosis   ? PONV (postoperative nausea and vomiting)   ? ? ?Past Surgical History:  ?Procedure Laterality Date  ? ABDOMINAL  HYSTERECTOMY    ? BIOPSY  07/12/2019  ? Procedure: BIOPSY;  Surgeon: Rogene Houston, MD;  [facility];  Service: Endoscopy;;  duodenum  ? BIOPSY  10/31/2020  ? Procedure: BIOPSY;  Surgeon: Harvel Quale, MD;  [facility];  Service: Gastroenterology;;  ? COLONOSCOPY    ? COLONOSCOPY N/A 02/17/2015  ? Procedure: COLONOSCOPY;  Surgeon: Rogene Houston, MD;  [facility];  Service: Endoscopy;  Laterality: N/A;  110n - moved to 11:15 - Ann to notify pt  ? COLONOSCOPY N/A 07/12/2019  ? rehman: - Diverticulosis in the sigmoid colon and in the descending colon., external  and internal hemorrhoids, no specimens collected  ? ESOPHAGOGASTRODUODENOSCOPY    ? ESOPHAGOGASTRODUODENOSCOPY N/A 01/29/2016  ? Procedure: ESOPHAGOGASTRODUODENOSCOPY (EGD);  Surgeon: Rogene Houston, MD;  [facility];  Service: Endoscopy;  Laterality: N/A;  ? ESOPHAGOGASTRODUODENOSCOPY N/A 10/14/2017  ? Procedure: ESOPHAGOGASTRODUODENOSCOPY (EGD);  Surgeon: Rogene Houston, MD;  [facility];  Service: Endoscopy;  Laterality: N/A;  730  ? ESOPHAGOGASTRODUODENOSCOPY N/A 07/12/2019  ? Procedure: ESOPHAGOGASTRODUODENOSCOPY (EGD);  Surgeon: Rogene Houston, MD;  [facility];  Service: Endoscopy;  Laterality: N/A;  ? ESOPHAGOGASTRODUODENOSCOPY N/A 10/03/2019  ? Procedure: ESOPHAGOGASTRODUODENOSCOPY (EGD);  Surgeon: Rogene Houston, MD;  [facility];  Service: Endoscopy;  Laterality: N/A;  ? ESOPHAGOGASTRODUODENOSCOPY (EGD) WITH PROPOFOL N/A 10/31/2020  ? Rehman: - LA Grade D esophagitis with no bleeding. 2cm HH, two polyps below GE junction, small amount of resude in stomach,gastric antral vascular ectasia w/o bleeding, treated with APC. scalloped mucosa found in duodenum, consistent with celiac disease  ? EYE SURGERY Right 01/2014  ? cataracts  ? GIVENS CAPSULE STUDY N/A 07/29/2019  ? Procedure: GIVENS CAPSULE STUDY;  Surgeon: Rogene Houston, MD;  [facility];  Service: Endoscopy;  Laterality: N/A;  ? GIVENS CAPSULE STUDY N/A 08/20/2019  ? Procedure: GIVENS CAPSULE STUDY;  Surgeon: Rogene Houston, MD;  [facility];  Service: Endoscopy;  Laterality: N/A;  730am  ? GIVENS CAPSULE STUDY  10/03/2019  ? Procedure: GIVENS CAPSULE STUDY;  Surgeon: Rogene Houston, MD;  [facility];  Service: Endoscopy;;  ? HOT HEMOSTASIS  10/31/2020  ? Procedure: HOT HEMOSTASIS (ARGON PLASMA COAGULATION/BICAP);  Surgeon: Montez Morita, Quillian Quince, MD;  [facility];  Service: Gastroenterology;;  ? INTRAMEDULLARY (IM) NAIL INTERTROCHANTERIC  Left 01/07/2019  ? Procedure: INTRAMEDULLARY (IM) NAIL INTERTROCHANTRIC;  Surgeon: Carole Civil, MD;  [facility];  Service: Orthopedics;  Laterality: Left;  ? KYPHOPLASTY N/A 07/08/2014  ? Procedure: Thoracic Eleven Kyphoplasty;  Surgeon: Hosie Spangle, MD;  Location: [place] [facility];  Service: Neurosurgery;  Laterality: N/A;  ? left hip surgery    ? LUMBAR LAMINECTOMY/DECOMPRESSION MICRODISCECTOMY Bilateral 05/20/2013  ? Procedure: LUMBAR LAMINECTOMY/DECOMPRESSION MICRODISCECTOMY 1 LEVEL;  Surgeon: Hosie Spangle, MD;  Location: [place] [facility];  Service: Neurosurgery;  Laterality: Bilateral;  Bilateral Lumbar four-five laminotomy and left lumbar four-five microdiskectomy  ? RIGHT/LEFT HEART CATH AND CORONARY ANGIOGRAPHY N/A 03/31/2019  ? Procedure: RIGHT/LEFT HEART CATH AND CORONARY ANGIOGRAPHY;  Surgeon: Sherren Mocha, MD;  Location: [place] [facility];  Service: Cardiovascular;  Laterality: N/A;  ? SPINE SURGERY    ? Dr Carloyn Manner -  vertebraplasty  ?  ? ?Home Medications:  ?Prior to Admission medications   ?Medication Sig Start Date End Date Taking? Authorizing Provider  ?acetaminophen (TYLENOL) 500 MG tablet Take 500 mg by  mouth every 8 (eight) hours as needed for mild pain, moderate pain or headache.  01/20/19  Yes [provider]  ?albuterol (VENTOLIN HFA) 108 (90 Base) MCG/ACT inhaler INHALE 2 PUFFS EVERY 6 HOURS AS NEEDED FOR WHEEZING OR SHORTNESS OF BREATH 12/19/21  Yes Stacks, Cletus Gash, MD  ?ALPRAZolam Duanne Moron) 0.5 MG tablet Take 1 tablet (0.5 mg total) by mouth 2 (two) times daily. 01/30/22  Yes Claretta Fraise, MD  ?benzonatate (TESSALON) 200 MG capsule Take 1 capsule (200 mg total) by mouth 3 (three) times daily as needed for cough. 01/30/22  Yes Claretta Fraise, MD  ?bisoprolol (ZEBETA) 5 MG tablet Take 1 tablet (5 mg total) by mouth daily. 03/28/21  Yes Minus Breeding, MD  ?calcium carbonate (TUMS - DOSED IN MG ELEMENTAL CALCIUM) 500 MG chewable tablet Chew 2 tablets (400 mg of elemental  calcium total) by mouth daily. 11/16/20  Yes Rehman, Mechele Dawley, MD  ?cholecalciferol (VITAMIN D) 1000 UNITS tablet Take 2,000 Units by mouth daily.    Yes [provider]  ?cyanocobalamin 1000 MCG tablet Take 1,0

## 2022-02-08 NOTE — Assessment & Plan Note (Signed)
Continue home dose alprazolam ?PDMP reviewed patient receives monthly alprazolam 0.5 mg, #60 ?

## 2022-02-08 NOTE — TOC Initial Note (Signed)
Transition of Care (TOC) - Initial/Assessment Note  ? ? ?Patient Details  ?Name: Misty [name] ?MRN: [id number] ?Date of Birth: [birth year] ? ?Transition of Care (TOC) CM/SW Contact:    ?Boneta Lucks, RN ?Phone Number: ?02/08/2022, 3:32 PM ? ?Clinical Narrative:        Patient admitted with acute on chronic systolic congestive heart failure. TOC consulted for CHF. Patient states she lives at home with her spouse and she has had home health RN's in the pasted for education on CHF. At this time she does not want to make a decision on home health, maybe at discharge. Living better with CHF book ordered for her to take home.          ? ? ?Expected Discharge Plan: Home/Self Care ?Barriers to Discharge: Continued Medical Work up ? ? ?Patient Goals and CMS Choice ?Patient states their goals for this hospitalization and ongoing recovery are:: to go home. ?CMS Medicare.gov Compare Post Acute Care list provided to:: Patient ?Choice offered to / list presented to : Patient ? ?Expected Discharge Plan and Services ?Expected Discharge Plan: Home/Self Care ?  ?   ?Living arrangements for the past 2 months: [place] ?                ?   ?Prior Living Arrangements/Services ?Living arrangements for the past 2 months: [place] ?Lives with:: Spouse ?   ?Current home services: DME ?  ? ?Activities of Daily Living ?Home Assistive Devices/Equipment: Gilford Rile (specify type), Wheelchair, Shower chair with back, Grab bars in shower, Grab bars around toilet ?ADL Screening (condition at time of admission) ?Patient's cognitive ability adequate to safely complete daily activities?: No ?Is the patient deaf or have difficulty hearing?: No ?Does the patient have difficulty seeing, even when wearing glasses/contacts?: No ?Does the patient have difficulty concentrating, remembering, or making decisions?: Yes ?Patient able to express need for assistance with ADLs?: Yes ?Does the patient have difficulty dressing or bathing?:  No ?Independently performs ADLs?: No ?Communication: Independent ?Dressing (OT): Needs assistance ?Grooming: Independent ?Feeding: Independent ?Bathing: Needs assistance ?Toileting: Needs assistance ?In/Out Bed: Needs assistance ?Walks in Home: Needs assistance ?Does the patient have difficulty walking or climbing stairs?: Yes ?Weakness of Legs: Left ?Weakness of Arms/Hands: None ? ? ?Emotional Assessment ?  ?  ?Admission diagnosis:  Acute on chronic systolic congestive heart failure ([place]) [I50.23] ?Acute on chronic systolic (congestive) heart failure ([place]) [I50.23] ?Patient Active Problem List  ? Diagnosis Date Noted  ? Acute HFrEF (heart failure with reduced ejection fraction) ([place]) 02/07/2022  ? Celiac disease 11/16/2020  ? Leg edema 11/16/2020  ? PNA (pneumonia) 10/30/2020  ? Iron deficiency anemia due to chronic blood loss 08/26/2019  ? Cardiomyopathy ([place]) 08/17/2019  ? Nonrheumatic mitral valve regurgitation 08/17/2019  ? Gastrointestinal hemorrhage 08/16/2019  ? Chronic systolic HF (heart failure) ([place]) 08/11/2019  ? Acute anemia 08/10/2019  ? GI bleed 07/28/2019  ? Gastroesophageal reflux disease with esophagitis without hemorrhage 07/20/2019  ? Paroxysmal atrial fibrillation ([place]) 07/20/2019  ? Anemia 07/10/2019  ? Aortic atherosclerosis ([place]) 07/09/2019  ? Vitamin D deficiency 07/09/2019  ? GAD (generalized anxiety disorder) 07/09/2019  ? Depression, recurrent ([place]) 07/09/2019  ? Controlled substance agreement signed 07/09/2019  ? Acute respiratory failure with hypoxia ([place])   ? Elevated troponin 03/25/2019  ? S/P IM nail left hip 01/07/2019 01/26/2019  ? Iron deficiency anemia 01/14/2019  ? Acute blood loss anemia 01/13/2019  ? Constipation due to opioid therapy 01/13/2019  ? Anxiety  and depression 01/13/2019  ? Chronic midline low back pain without sciatica 10/09/2018  ? Chronic midline thoracic back pain 10/09/2018  ? Osteopenia determined by x-ray 04/16/2018  ? Pulmonary fibrosis ([place]) 04/07/2018  ?  Non-intractable vomiting with nausea 10/06/2017  ? Malnutrition of moderate degree 01/29/2016  ? Symptomatic anemia 01/27/2016  ? Generalized weakness 01/27/2016  ? Abnormal chest x-ray 01/27/2016  ? Tobacco use disorder 01/27/2016  ? Ischemic colitis ([place]) 02/26/2015  ? Orthostatic hypotension 02/26/2015  ? Mixed hyperlipidemia 08/19/2013  ? Fibrocystic breast disease 08/19/2013  ? History of migraine headaches 08/19/2013  ? GERD (gastroesophageal reflux disease) 04/07/2013  ? Osteoporosis, postmenopausal 04/07/2013  ? ?PCP:  Claretta Fraise, MD ?Pharmacy:   ?[facility] [address] ?[place] [address] ?Phone: [phone] Fax: [phone] ? ?Readmission Risk Interventions ? ?  02/08/2022  ?  3:31 PM 10/04/2019  ?  1:16 PM 08/12/2019  ?  1:28 PM  ?Readmission Risk Prevention Plan  ?Medication Screening Complete    ?Transportation Screening Complete Complete Complete  ?Medication Review Press photographer)  Complete Complete  ?PCP or Specialist appointment within 3-5 days of discharge  Not Complete Complete  ?PCP/Specialist Appt Not Complete comments  Earliest appointment is 8 days out.   ?[place] or Home Care Consult  Complete Complete  ?SW Recovery Care/Counseling Consult  Complete Complete  ?Palliative Care Screening  Not Applicable Not Complete  ?Skilled Nursing Facility  Not Applicable Not Complete  ? ? ? ?

## 2022-02-08 NOTE — Assessment & Plan Note (Addendum)
CHADSVASc = 6 ?Intolerant to anticoagulation secondary to anemia ?Has not been interested in the [place] device ?Continue bisoprolol home dose ?Rate controlled ?

## 2022-02-08 NOTE — Progress Notes (Signed)
*  PRELIMINARY RESULTS* ?Echocardiogram ?2D Echocardiogram has been performed. ? ?Misty [name] ?02/08/2022, 4:02 PM ?

## 2022-02-08 NOTE — Hospital Course (Addendum)
82 year old female with a history of paroxysmal atrial fibrillation, hyperlipidemia, systolic CHF, anxiety, COPD, tobacco abuse, pulmonary fibrosis, nonobstructive CAD presenting with approximately 1 to 2-week history of worsening shortness of breath.  The patient states that she has gained over 10 pounds since her PCP visit on 01/30/2022.  Her weight at that time was 137 pounds.  She states that at baseline she weighs in the low 120s.  She complains of increasing lower extremity edema, orthopnea type symptoms.  In addition, the patient has had increasing generalized weakness.  She denies any fevers, chills, headache, neck pain, chest pain, nausea, vomiting, diarrhea.  She endorses compliance with her medications.  She last saw cardiology, ? Dr. Percival Spanish, on October/2022 at which time her weight was 123 pounds.  At the time of this admission, her weight was 147.9 pounds.  The patient endorses compliance with her medications.  However review of the past notes do say that the patient has been hesitant to make medication changes.  At the time of her last cardiology appointment, losartan was increased to 25 mg daily and the patient was continued on bisoprolol.  She has been intolerant to anticoagulation secondary to her anemia, and she was not interested in the [place] device. ?In the ED, the patient was afebrile hemodynamically stable.  WBC 7.0, hemoglobin 8.4, platelets 187,000.  Sodium 141, potassium 3.9, serum creatinine 1.02, BNP 2535.  Initial troponin 152>>171.  The patient was given 2 doses of IV furosemide 20 mg x 2.  She was started on furosemide 40 mg IV twice daily and cardiology was consulted to assist with management. ? ?3/25--breathing better, but continues to have generalized weakness.  Diuresing well with lasix 40 IV bid with 2400 CC out last 24 hours and neg 8.5lbs. ? ?3/26--having some hospital delirium in last 24 hours.  Continue home xanax.  Prn ativan.  Diuresing well.  Now neg 14  lbs. ? ?3/27--occasional delirium.  Diuresing well. Denies f/c, cp, n/v/d.  Start weaning off oxygen.  Possible transition to po lasix 3/28 ? ?3/28--cleared by cardiology for d/c.  Pt states no cp or sob.  Will need 3L home oxygen.  HHPT, RN set up.  Daughter in law states pt frequently has hospital delirium which normally improves at home. ?

## 2022-02-08 NOTE — Progress Notes (Signed)
?  ?       ?PROGRESS NOTE ? ?Misty [name] VFI:[id number] DOB: 1941-03-29 DOA: 02/07/2022 ?PCP: Claretta Fraise, MD ? ?Brief History:  ? ?Assessment and Plan: ?* Acute HFrEF (heart failure with reduced ejection fraction) ([place]) ?Dry weight appears to be 123 ?Presented with weight 247 pounds ?Remains clinically fluid overloaded ?-Continue IV Lasix 40 twice daily ?-03/26/2019 echo EF 30-35%, diffuse HK, moderate MR, mild TR ?-Obtain updated echocardiogram ?-Strict input output, daily weights, daily BMP ?-Start duo nebs ?Start Pulmicort ? ?Acute respiratory failure with hypoxia ([place]) ?O2 sats 87% on room air.  ?Secondary to pulmonary edema and COPD ?Stable on 2L ?Wean for saturation >92% ?Personally reviewed CXR--chronic interstitial markings ?-Strict input output, daily weights, daily BMP ?-Start duo nebs ?Start Pulmicort ? ?Paroxysmal atrial fibrillation (HCC) ?Currently in sinus rhythm ?CHADSVASc = 6 ?Intolerant to anticoagulation secondary to anemia ?Has not been interested in the [place] device ?Continue bisoprolol home dose ? ?Elevated troponin ?Troponins 152>> 171 ?Secondary to demand ischemia in the setting of CHF exacerbation ?No recent chest pain ?Repeat echo ?Listed allergy to aspirin and intolerance to statins ? ?Anemia ?Hemoglobin 8.4 on presentation ?Baseline Hgb 8-9 ?Check anemia panel ?No active bleeding presently ? ?Anxiety and depression ?Continue home dose alprazolam ?PDMP reviewed patient receives monthly alprazolam 0.5 mg, #60 ? ?Tobacco use disorder ?Tobacco cessation discussed ? ? ? ? ? ? ? ? ?Status is: Inpatient ?Remains inpatient appropriate because: due to severity of illness requiring IV lasix and new oxygen requirement ? ? ? ?Family Communication:   no Family at bedside ? ?Consultants:  cardiology ? ?Code Status:  DNR ? ?DVT Prophylaxis:  [place] Lovenox ? ? ?Procedures: ?As Listed in Progress Note Above ? ?Antibiotics: ?None ? ? ? ? ?Subjective: ?Complains of shortness of breath, but it is a  little better than yesterday.  She has dyspnea on exertion.  She denies any fever, chills, chest pain, nausea, vomiting or diarrhea, abdominal pain. ? ?Objective: ?Vitals:  ? 02/08/22 0500 02/08/22 0525 02/08/22 0911 02/08/22 1414  ?BP:  118/71  114/73  ?Pulse:  79  (!) 110  ?Resp:  15  20  ?Temp:  97.9 ?F (36.6 ?C)  98.2 ?F (36.8 ?C)  ?TempSrc:  Oral  Oral  ?SpO2:  98%  97%  ?Weight: 67.1 kg     ?Height:   5' 7"  (1.702 m)   ? ? ?Intake/Output Summary (Last 24 hours) at 02/08/2022 1526 ?Last data filed at 02/08/2022 1400 ?Gross per 24 hour  ?Intake 240 ml  ?Output 1900 ml  ?Net -1660 ml  ? ?Weight change:  ?Exam: ? ?General:  Pt is alert, follows commands appropriately, not in acute distress ?HEENT: No icterus, No thrush, No neck mass, [place]/AT ?Cardiovascular: RRR, S1/S2, no rubs, no gallops ?Respiratory: bilateral crackles.  Minimal basilar wheeze ?Abdomen: Soft/+BS, non tender, non distended, no guarding ?Extremities: 3+ LE edema, No lymphangitis, No petechiae, No rashes, no synovitis ? ? ?Data Reviewed: ?I have personally reviewed following labs and imaging studies ?Basic Metabolic Panel: ?Recent Labs  ?Lab 02/07/22 ?1549 02/08/22 ?0433  ?NA 141 142  ?K 3.9 3.8  ?CL 104 101  ?CO2 27 33*  ?GLUCOSE 137* 119*  ?BUN 24* 23  ?CREATININE 1.02* 1.03*  ?CALCIUM 8.5* 8.2*  ? ?Liver Function Tests: ?Recent Labs  ?Lab 02/07/22 ?1549  ?AST 26  ?ALT 22  ?ALKPHOS 81  ?BILITOT 0.5  ?PROT 5.8*  ?ALBUMIN 3.1*  ? ?No results for input(s): LIPASE, AMYLASE in the last 168  hours. ?No results for input(s): AMMONIA in the last 168 hours. ?Coagulation Profile: ?No results for input(s): INR, PROTIME in the last 168 hours. ?CBC: ?Recent Labs  ?Lab 02/07/22 ?1549 02/08/22 ?0433  ?WBC 7.0 7.1  ?NEUTROABS 5.5  --   ?HGB 8.4* 8.5*  ?HCT 31.2* 31.9*  ?MCV 90.4 90.4  ?PLT 187 160  ? ?Cardiac Enzymes: ?No results for input(s): CKTOTAL, CKMB, CKMBINDEX, TROPONINI in the last 168 hours. ?BNP: ?Invalid input(s): POCBNP ?CBG: ?No results for input(s):  GLUCAP in the last 168 hours. ?HbA1C: ?No results for input(s): HGBA1C in the last 72 hours. ?Urine analysis: ?   ?Component Value Date/Time  ? COLORURINE YELLOW 01/14/2019 0226  ? APPEARANCEUR CLEAR 01/14/2019 0226  ? LABSPEC 1.012 01/14/2019 0226  ? PHURINE 7.0 01/14/2019 0226  ? GLUCOSEU NEGATIVE 01/14/2019 0226  ? [place] NEGATIVE 01/14/2019 0226  ? [place] NEGATIVE 01/14/2019 0226  ? [place] NEGATIVE 01/14/2019 0226  ? PROTEINUR NEGATIVE 01/14/2019 0226  ? NITRITE NEGATIVE 01/14/2019 0226  ? LEUKOCYTESUR NEGATIVE 01/14/2019 0226  ? ?Sepsis Labs: ?@LABRCNTIP (procalcitonin:4,lacticidven:4) ?)No results found for this or any previous visit (from the past 240 hour(s)).  ? ?Scheduled Meds: ? bisoprolol  5 mg Oral Daily  ? enoxaparin (LOVENOX) injection  40 mg Subcutaneous Q24H  ? furosemide  40 mg Intravenous BID  ? losartan  25 mg Oral Daily  ? ?Continuous Infusions: ? ?Procedures/Studies: ?DG Chest 2 View ? ?Result Date: 02/07/2022 ?CLINICAL DATA:  Chest pain and short of breath.  Leg swelling EXAM: CHEST - 2 VIEW COMPARISON:  10/30/2020 FINDINGS: Cardiac enlargement. Mild vascular congestion. Underlying chronic lung disease based on the prior study. Negative for edema. Small bilateral pleural effusions and mild bibasilar atelectasis. Cement vertebral augmentation at multiple levels in the thoracic and lumbar spine. Limited bony detail due to osteopenia. IMPRESSION: Cardiac enlargement without edema. Mild bibasilar atelectasis and small effusions. Electronically Signed   By: Franchot Gallo M.D.   On: 02/07/2022 16:50   ? ?Orson Eva, DO  ?[facility] ? ?If 7PM-7AM, please contact night-coverage [email or web address] ?Password [email or web address] ?02/08/2022, 3:26 PM ? ? LOS: 1 day  ? ?

## 2022-02-09 DIAGNOSIS — J9601 Acute respiratory failure with hypoxia: Secondary | ICD-10-CM | POA: Diagnosis not present

## 2022-02-09 DIAGNOSIS — F419 Anxiety disorder, unspecified: Secondary | ICD-10-CM | POA: Diagnosis not present

## 2022-02-09 DIAGNOSIS — I248 Other forms of acute ischemic heart disease: Secondary | ICD-10-CM

## 2022-02-09 DIAGNOSIS — I5021 Acute systolic (congestive) heart failure: Secondary | ICD-10-CM | POA: Diagnosis not present

## 2022-02-09 LAB — BASIC METABOLIC PANEL
Anion gap: 7 (ref 5–15)
BUN: 22 mg/dL (ref 8–23)
CO2: 38 mmol/L — ABNORMAL HIGH (ref 22–32)
Calcium: 7.9 mg/dL — ABNORMAL LOW (ref 8.9–10.3)
Chloride: 98 mmol/L (ref 98–111)
Creatinine, Ser: 1.01 mg/dL — ABNORMAL HIGH (ref 0.44–1.00)
GFR, Estimated: 56 mL/min — ABNORMAL LOW (ref >60)
Glucose, Bld: 104 mg/dL — ABNORMAL HIGH (ref 70–99)
Potassium: 3.8 mmol/L (ref 3.5–5.1)
Sodium: 143 mmol/L (ref 135–145)

## 2022-02-09 LAB — MAGNESIUM: Magnesium: 1.5 mg/dL — ABNORMAL LOW (ref 1.7–2.4)

## 2022-02-09 LAB — IRON AND TIBC
Iron: 19 ug/dL — ABNORMAL LOW (ref 28–170)
Saturation Ratios: 6 % — ABNORMAL LOW (ref 10.4–31.8)
TIBC: 310 ug/dL (ref 250–450)
UIBC: 291 ug/dL

## 2022-02-09 LAB — FOLATE: Folate: 5 ng/mL — ABNORMAL LOW (ref >5.9)

## 2022-02-09 LAB — VITAMIN B12: Vitamin B-12: 943 pg/mL — ABNORMAL HIGH (ref 180–914)

## 2022-02-09 LAB — FERRITIN: Ferritin: 14 ng/mL (ref 11–307)

## 2022-02-09 MED ORDER — FOLIC ACID 1 MG PO TABS
1.0000 mg | ORAL_TABLET | Freq: Every day | ORAL | Status: DC
  Administered 2022-02-09 – 2022-02-12 (×4): 1 mg via ORAL
  Filled 2022-02-09 (×4): qty 1

## 2022-02-09 MED ORDER — ALPRAZOLAM 0.5 MG PO TABS
0.5000 mg | ORAL_TABLET | Freq: Three times a day (TID) | ORAL | Status: DC | PRN
  Administered 2022-02-09 – 2022-02-12 (×8): 0.5 mg via ORAL
  Filled 2022-02-09 (×8): qty 1

## 2022-02-09 MED ORDER — SODIUM CHLORIDE 0.9 % IV SOLN
250.0000 mg | Freq: Once | INTRAVENOUS | Status: AC
  Administered 2022-02-09: 250 mg via INTRAVENOUS
  Filled 2022-02-09: qty 20

## 2022-02-09 MED ORDER — MAGNESIUM SULFATE 2 GM/50ML IV SOLN
2.0000 g | Freq: Once | INTRAVENOUS | Status: AC
  Administered 2022-02-09: 2 g via INTRAVENOUS
  Filled 2022-02-09: qty 50

## 2022-02-09 NOTE — Assessment & Plan Note (Signed)
Elevated troponins flat ?No chest pain ?

## 2022-02-09 NOTE — Progress Notes (Signed)
?  ?       ?PROGRESS NOTE ? ?Misty [name] EPP:[id number] DOB: [birth year] DOA: 02/07/2022 ?PCP: Claretta Fraise, MD ? ?Brief History:  ?81 year old female with a history of paroxysmal atrial fibrillation, hyperlipidemia, systolic CHF, anxiety, COPD, tobacco abuse, pulmonary fibrosis, nonobstructive CAD presenting with approximately 1 to 2-week history of worsening shortness of breath.  The patient states that she has gained over 10 pounds since her PCP visit on 01/30/2022.  Her weight at that time was 137 pounds.  She states that at baseline she weighs in the low 120s.  She complains of increasing lower extremity edema, orthopnea type symptoms.  In addition, the patient has had increasing generalized weakness.  She denies any fevers, chills, headache, neck pain, chest pain, nausea, vomiting, diarrhea.  She endorses compliance with her medications.  She last saw cardiology, ? Dr. Percival Spanish, on October/2022 at which time her weight was 123 pounds.  At the time of this admission, her weight was 147.9 pounds.  The patient endorses compliance with her medications.  However review of the past notes do say that the patient has been hesitant to make medication changes.  At the time of her last cardiology appointment, losartan was increased to 25 mg daily and the patient was continued on bisoprolol.  She has been intolerant to anticoagulation secondary to her anemia, and she was not interested in the [place] device. ?In the ED, the patient was afebrile hemodynamically stable.  WBC 7.0, hemoglobin 8.4, platelets 187,000.  Sodium 141, potassium 3.9, serum creatinine 1.02, BNP 2535.  Initial troponin 152>>171.  The patient was given 2 doses of IV furosemide 20 mg x 2.  She was started on furosemide 40 mg IV twice daily and cardiology was consulted to assist with management. ? ?3/25--breathing better, but continues to have generalized weakness.  Diuresing well with lasix 40 IV bid with 2400 CC out last 24 hours and neg 8.5lbs.   ? ? ?Assessment and Plan: ?* Acute HFrEF (heart failure with reduced ejection fraction) ([place]) ?Dry weight appears to be 123 ?Presented with weight 247 pounds ?Remains clinically fluid overloaded ?-Continue IV Lasix 40 twice daily ?-03/26/2019 echo EF 30-35%, diffuse HK, moderate MR, mild TR ?-3/25 echo--EF 35-40, global HK, RVSP 62.1 ?-Strict input output, daily weights, daily BMP ?-continue duo nebs ?-continue Pulmicort ?-continue bisoprolol and losartan ?-NEG 8 lbs ? ?Acute respiratory failure with hypoxia ([place]) ?O2 sats 87% on room air.  ?Secondary to pulmonary edema and COPD ?Stable on 2L ?Wean for saturation >92% ?Personally reviewed CXR--chronic interstitial markings ?-Strict input output, daily weights, daily BMP ?-continue duo nebs ?-continue Pulmicort ? ?Paroxysmal atrial fibrillation (HCC) ?Currently in sinus rhythm ?CHADSVASc = 6 ?Intolerant to anticoagulation secondary to anemia ?Has not been interested in the [place] device ?Continue bisoprolol home dose ? ?Elevated troponin ?Troponins 152>> 171 ?Secondary to demand ischemia in the setting of CHF exacerbation ?No recent chest pain ?Repeat echo as above ?Listed allergy to aspirin and intolerance to statins ? ?Demand ischemia ([place]) ?Elevated troponins flat ?No chest pain ? ?Anemia ?Hemoglobin 8.4 on presentation ?Baseline Hgb 8-9 ?Iron sat 6%, ferritin 14>>nulecit x 1 ?B12--943 ?Folate 5.0>>replete ?No active bleeding presently ? ?Anxiety and depression ?Continue home dose alprazolam ?PDMP reviewed patient receives monthly alprazolam 0.5 mg, #60 ? ?Tobacco use disorder ?Tobacco cessation discussed ? ? ?Status is: Inpatient ?Remains inpatient appropriate because: due to severity of illness requiring IV lasix and new oxygen requirement ?  ?  ?  ?Family Communication:   no Family  at bedside ?  ?Consultants:  cardiology ?  ?Code Status:  DNR ?  ?DVT Prophylaxis:  [place] Lovenox ?  ?  ?Procedures: ?As Listed in Progress Note Above ?  ?Antibiotics: ?None ?   ? ? ? ? ? ?Subjective: ?Breathing better, but continues to feel weak.  Denies f/c, cp, n/v/d, abd pain ? ?Objective: ?Vitals:  ? 02/09/22 0506 02/09/22 0528 02/09/22 1012 02/09/22 1308  ?BP: 115/80   (!) 93/59  ?Pulse: (!) 106   91  ?Resp:    16  ?Temp: 97.7 ?F (36.5 ?C)   98.5 ?F (36.9 ?C)  ?TempSrc: Oral   Oral  ?SpO2: 96%  95% 91%  ?Weight:  61.8 kg    ?Height:      ? ? ?Intake/Output Summary (Last 24 hours) at 02/09/2022 1656 ?Last data filed at 02/09/2022 1400 ?Gross per 24 hour  ?Intake 180 ml  ?Output 3540 ml  ?Net -3360 ml  ? ?Weight change: -5.3 kg ?Exam: ? ?General:  Pt is alert, follows commands appropriately, not in acute distress ?HEENT: No icterus, No thrush, No neck mass, [place]/AT ?Cardiovascular: RRR, S1/S2, no rubs, no gallops ?Respiratory: bibasilar rales.  No wheeze ?Abdomen: Soft/+BS, non tender, non distended, no guarding ?Extremities: 1 + LE edema, No lymphangitis, No petechiae, No rashes, no synovitis ? ? ?Data Reviewed: ?I have personally reviewed following labs and imaging studies ?Basic Metabolic Panel: ?Recent Labs  ?Lab 02/07/22 ?1549 02/08/22 ?3007 02/09/22 ?6226  ?NA 141 142 143  ?K 3.9 3.8 3.8  ?CL 104 101 98  ?CO2 27 33* 38*  ?GLUCOSE 137* 119* 104*  ?BUN 24* 23 22  ?CREATININE 1.02* 1.03* 1.01*  ?CALCIUM 8.5* 8.2* 7.9*  ?MG  --   --  1.5*  ? ?Liver Function Tests: ?Recent Labs  ?Lab 02/07/22 ?1549  ?AST 26  ?ALT 22  ?ALKPHOS 81  ?BILITOT 0.5  ?PROT 5.8*  ?ALBUMIN 3.1*  ? ?No results for input(s): LIPASE, AMYLASE in the last 168 hours. ?No results for input(s): AMMONIA in the last 168 hours. ?Coagulation Profile: ?No results for input(s): INR, PROTIME in the last 168 hours. ?CBC: ?Recent Labs  ?Lab 02/07/22 ?1549 02/08/22 ?0433  ?WBC 7.0 7.1  ?NEUTROABS 5.5  --   ?HGB 8.4* 8.5*  ?HCT 31.2* 31.9*  ?MCV 90.4 90.4  ?PLT 187 160  ? ?Cardiac Enzymes: ?No results for input(s): CKTOTAL, CKMB, CKMBINDEX, TROPONINI in the last 168 hours. ?BNP: ?Invalid input(s): POCBNP ?CBG: ?No results for  input(s): GLUCAP in the last 168 hours. ?HbA1C: ?No results for input(s): HGBA1C in the last 72 hours. ?Urine analysis: ?   ?Component Value Date/Time  ? COLORURINE YELLOW 01/14/2019 0226  ? APPEARANCEUR CLEAR 01/14/2019 0226  ? LABSPEC 1.012 01/14/2019 0226  ? PHURINE 7.0 01/14/2019 0226  ? GLUCOSEU NEGATIVE 01/14/2019 0226  ? [place] NEGATIVE 01/14/2019 0226  ? [place] NEGATIVE 01/14/2019 0226  ? [place] NEGATIVE 01/14/2019 0226  ? PROTEINUR NEGATIVE 01/14/2019 0226  ? NITRITE NEGATIVE 01/14/2019 0226  ? LEUKOCYTESUR NEGATIVE 01/14/2019 0226  ? ?Sepsis Labs: ?@LABRCNTIP (procalcitonin:4,lacticidven:4) ?)No results found for this or any previous visit (from the past 240 hour(s)).  ? ?Scheduled Meds: ? bisoprolol  5 mg Oral Daily  ? budesonide (PULMICORT) nebulizer solution  0.5 mg Nebulization BID  ? enoxaparin (LOVENOX) injection  40 mg Subcutaneous J33L  ? folic acid  1 mg Oral Daily  ? furosemide  40 mg Intravenous BID  ? ipratropium  0.5 mg Nebulization BID  ? levalbuterol  0.63 mg Nebulization  BID  ? losartan  25 mg Oral Daily  ? ?Continuous Infusions: ? ferric gluconate (FERRLECIT) IVPB    ? magnesium sulfate bolus IVPB    ? ? ?Procedures/Studies: ?DG Chest 2 View ? ?Result Date: 02/07/2022 ?CLINICAL DATA:  Chest pain and short of breath.  Leg swelling EXAM: CHEST - 2 VIEW COMPARISON:  10/30/2020 FINDINGS: Cardiac enlargement. Mild vascular congestion. Underlying chronic lung disease based on the prior study. Negative for edema. Small bilateral pleural effusions and mild bibasilar atelectasis. [place] vertebral augmentation at multiple levels in the thoracic and lumbar spine. Limited bony detail due to osteopenia. IMPRESSION: Cardiac enlargement without edema. Mild bibasilar atelectasis and small effusions. Electronically Signed   By: Franchot Gallo M.D.   On: 02/07/2022 16:50  ? ?ECHOCARDIOGRAM COMPLETE ? ?Result Date: 02/08/2022 ?   ECHOCARDIOGRAM REPORT   Patient Name:   Misty [name] Date of Exam:  02/08/2022 Medical Rec #:  [id number]        Height:       67.0 in Accession #:    [id number]       Weight:       147.9 lb Date of Birth:  11/15/1941        BSA:          1.779 m? Patient Age:    67 years         BP:

## 2022-02-10 DIAGNOSIS — I5021 Acute systolic (congestive) heart failure: Secondary | ICD-10-CM | POA: Diagnosis not present

## 2022-02-10 DIAGNOSIS — F419 Anxiety disorder, unspecified: Secondary | ICD-10-CM | POA: Diagnosis not present

## 2022-02-10 DIAGNOSIS — J9601 Acute respiratory failure with hypoxia: Secondary | ICD-10-CM | POA: Diagnosis not present

## 2022-02-10 DIAGNOSIS — I248 Other forms of acute ischemic heart disease: Secondary | ICD-10-CM | POA: Diagnosis not present

## 2022-02-10 LAB — BASIC METABOLIC PANEL
Anion gap: 8 (ref 5–15)
BUN: 21 mg/dL (ref 8–23)
CO2: 38 mmol/L — ABNORMAL HIGH (ref 22–32)
Calcium: 7.9 mg/dL — ABNORMAL LOW (ref 8.9–10.3)
Chloride: 96 mmol/L — ABNORMAL LOW (ref 98–111)
Creatinine, Ser: 1.1 mg/dL — ABNORMAL HIGH (ref 0.44–1.00)
GFR, Estimated: 51 mL/min — ABNORMAL LOW (ref >60)
Glucose, Bld: 108 mg/dL — ABNORMAL HIGH (ref 70–99)
Potassium: 3.4 mmol/L — ABNORMAL LOW (ref 3.5–5.1)
Sodium: 142 mmol/L (ref 135–145)

## 2022-02-10 LAB — MAGNESIUM: Magnesium: 2 mg/dL (ref 1.7–2.4)

## 2022-02-10 MED ORDER — POTASSIUM CHLORIDE CRYS ER 20 MEQ PO TBCR
20.0000 meq | EXTENDED_RELEASE_TABLET | Freq: Once | ORAL | Status: AC
  Administered 2022-02-10: 20 meq via ORAL
  Filled 2022-02-10: qty 1

## 2022-02-10 MED ORDER — NICOTINE 14 MG/24HR TD PT24
14.0000 mg | MEDICATED_PATCH | Freq: Every day | TRANSDERMAL | Status: DC
  Administered 2022-02-10 – 2022-02-12 (×3): 14 mg via TRANSDERMAL
  Filled 2022-02-10 (×3): qty 1

## 2022-02-10 NOTE — Progress Notes (Signed)
?  ?       ?PROGRESS NOTE ? ?Misty [name] EKC:[id number] DOB: [birth year] DOA: 02/07/2022 ?PCP: Claretta Fraise, MD ? ?Brief History:  ?81 year old female with a history of paroxysmal atrial fibrillation, hyperlipidemia, systolic CHF, anxiety, COPD, tobacco abuse, pulmonary fibrosis, nonobstructive CAD presenting with approximately 1 to 2-week history of worsening shortness of breath.  The patient states that she has gained over 10 pounds since her PCP visit on 01/30/2022.  Her weight at that time was 137 pounds.  She states that at baseline she weighs in the low 120s.  She complains of increasing lower extremity edema, orthopnea type symptoms.  In addition, the patient has had increasing generalized weakness.  She denies any fevers, chills, headache, neck pain, chest pain, nausea, vomiting, diarrhea.  She endorses compliance with her medications.  She last saw cardiology, ? Dr. Percival Spanish, on October/2022 at which time her weight was 123 pounds.  At the time of this admission, her weight was 147.9 pounds.  The patient endorses compliance with her medications.  However review of the past notes do say that the patient has been hesitant to make medication changes.  At the time of her last cardiology appointment, losartan was increased to 25 mg daily and the patient was continued on bisoprolol.  She has been intolerant to anticoagulation secondary to her anemia, and she was not interested in the [place] device. ?In the ED, the patient was afebrile hemodynamically stable.  WBC 7.0, hemoglobin 8.4, platelets 187,000.  Sodium 141, potassium 3.9, serum creatinine 1.02, BNP 2535.  Initial troponin 152>>171.  The patient was given 2 doses of IV furosemide 20 mg x 2.  She was started on furosemide 40 mg IV twice daily and cardiology was consulted to assist with management. ? ?3/25--breathing better, but continues to have generalized weakness.  Diuresing well with lasix 40 IV bid with 2400 CC out last 24 hours and neg  8.5lbs. ? ?3/26--having some hospital delirium in last 24 hours.  Continue home xanax.  Prn ativan.  Diuresing well.  Now neg 14 lbs.  ? ? ? ?Assessment and Plan: ?* Acute HFrEF (heart failure with reduced ejection fraction) ([place]) ?Dry weight appears to be 123 ?Presented with weight 247 pounds ?Remains clinically fluid overloaded +JVD ?-Continue IV Lasix 40 twice daily ?-03/26/2019 echo EF 30-35%, diffuse HK, moderate MR, mild TR ?-3/25 echo--EF 35-40, global HK, RVSP 62.1 ?-Strict input output, daily weights, daily BMP ?-continue duo nebs ?-continue Pulmicort ?-continue bisoprolol and losartan ?-NEG 14 lbs ? ?Acute respiratory failure with hypoxia ([place]) ?O2 sats 87% on room air.  ?Secondary to pulmonary edema and COPD ?Stable on 2L ?Wean for saturation >92% ?Personally reviewed CXR--chronic interstitial markings ?-Strict input output, daily weights, daily BMP ?-continue duo nebs ?-continue Pulmicort ? ?Paroxysmal atrial fibrillation (HCC) ?Currently in sinus rhythm ?CHADSVASc = 6 ?Intolerant to anticoagulation secondary to anemia ?Has not been interested in the [place] device ?Continue bisoprolol home dose ? ?Elevated troponin ?Troponins 152>> 171 ?Secondary to demand ischemia in the setting of CHF exacerbation ?No recent chest pain ?Repeat echo as above ?Listed allergy to aspirin and intolerance to statins ? ?Demand ischemia ([place]) ?Elevated troponins flat ?No chest pain ? ?Anemia ?Hemoglobin 8.4 on presentation ?Baseline Hgb 8-9 ?Iron sat 6%, ferritin 14>>nulecit x 1 ?B12--943 ?Folate 5.0>>replete ?No active bleeding presently ? ?Anxiety and depression ?Continue home dose alprazolam ?PDMP reviewed patient receives monthly alprazolam 0.5 mg, #60 ? ?Tobacco use disorder ?Tobacco cessation discussed ? ? ? ? ?Status is: Inpatient ?  Remains inpatient appropriate because: due to severity of illness requiring IV lasix and new oxygen requirement ?  ?  ?  ?Family Communication:   daughter in law updated 3/25 ?Consultants:   cardiology ?  ?Code Status:  DNR ?  ?DVT Prophylaxis:  [place] Lovenox ?  ?  ?Procedures: ?As Listed in Progress Note Above ?  ?Antibiotics: ?None ?  ? ? ? ? ? ? ? ?Subjective: ?Patient denies fevers, chills, headache, chest pain, dyspnea, nausea, vomiting, diarrhea, abdominal pain, ? ?Objective: ?Vitals:  ? 02/09/22 2049 02/10/22 8921 02/10/22 0543 02/10/22 1941  ?BP: (!) 105/58 116/75    ?Pulse: 62 90    ?Resp: 16 16    ?Temp: 98.6 ?F (37 ?C) (!) 97.4 ?F (36.3 ?C)    ?TempSrc: Oral Oral    ?SpO2: 97% 95%  91%  ?Weight:   60.4 kg   ?Height:      ? ? ?Intake/Output Summary (Last 24 hours) at 02/10/2022 1758 ?Last data filed at 02/10/2022 1345 ?Gross per 24 hour  ?Intake 590 ml  ?Output 2100 ml  ?Net -1510 ml  ? ?Weight change: -1.4 kg ?Exam: ? ?General:  Pt is alert, follows commands appropriately, not in acute distress ?HEENT: No icterus, No thrush, No neck mass, [place]/AT ?Cardiovascular: RRR, S1/S2, no rubs, no gallops ?Respiratory: bibasilar crackles. No wheeze ?Abdomen: Soft/+BS, non tender, non distended, no guarding ?Extremities: 1 + LE edema, No lymphangitis, No petechiae, No rashes, no synovitis ? ? ?Data Reviewed: ?I have personally reviewed following labs and imaging studies ?Basic Metabolic Panel: ?Recent Labs  ?Lab 02/07/22 ?1549 02/08/22 ?7408 02/09/22 ?1448 02/10/22 ?0413  ?NA 141 142 143 142  ?K 3.9 3.8 3.8 3.4*  ?CL 104 101 98 96*  ?CO2 27 33* 38* 38*  ?GLUCOSE 137* 119* 104* 108*  ?BUN 24* 23 22 21   ?CREATININE 1.02* 1.03* 1.01* 1.10*  ?CALCIUM 8.5* 8.2* 7.9* 7.9*  ?MG  --   --  1.5* 2.0  ? ?Liver Function Tests: ?Recent Labs  ?Lab 02/07/22 ?1549  ?AST 26  ?ALT 22  ?ALKPHOS 81  ?BILITOT 0.5  ?PROT 5.8*  ?ALBUMIN 3.1*  ? ?No results for input(s): LIPASE, AMYLASE in the last 168 hours. ?No results for input(s): AMMONIA in the last 168 hours. ?Coagulation Profile: ?No results for input(s): INR, PROTIME in the last 168 hours. ?CBC: ?Recent Labs  ?Lab 02/07/22 ?1549 02/08/22 ?0433  ?WBC 7.0 7.1  ?NEUTROABS 5.5  --    ?HGB 8.4* 8.5*  ?HCT 31.2* 31.9*  ?MCV 90.4 90.4  ?PLT 187 160  ? ?Cardiac Enzymes: ?No results for input(s): CKTOTAL, CKMB, CKMBINDEX, TROPONINI in the last 168 hours. ?BNP: ?Invalid input(s): POCBNP ?CBG: ?No results for input(s): GLUCAP in the last 168 hours. ?HbA1C: ?No results for input(s): HGBA1C in the last 72 hours. ?Urine analysis: ?   ?Component Value Date/Time  ? COLORURINE YELLOW 01/14/2019 0226  ? APPEARANCEUR CLEAR 01/14/2019 0226  ? LABSPEC 1.012 01/14/2019 0226  ? PHURINE 7.0 01/14/2019 0226  ? GLUCOSEU NEGATIVE 01/14/2019 0226  ? [place] NEGATIVE 01/14/2019 0226  ? [place] NEGATIVE 01/14/2019 0226  ? [place] NEGATIVE 01/14/2019 0226  ? PROTEINUR NEGATIVE 01/14/2019 0226  ? NITRITE NEGATIVE 01/14/2019 0226  ? LEUKOCYTESUR NEGATIVE 01/14/2019 0226  ? ?Sepsis Labs: ?@LABRCNTIP (procalcitonin:4,lacticidven:4) ?)No results found for this or any previous visit (from the past 240 hour(s)).  ? ?Scheduled Meds: ? bisoprolol  5 mg Oral Daily  ? budesonide (PULMICORT) nebulizer solution  0.5 mg Nebulization BID  ? enoxaparin (LOVENOX)  injection  40 mg Subcutaneous O32N  ? folic acid  1 mg Oral Daily  ? furosemide  40 mg Intravenous BID  ? ipratropium  0.5 mg Nebulization BID  ? levalbuterol  0.63 mg Nebulization BID  ? losartan  25 mg Oral Daily  ? nicotine  14 mg Transdermal Daily  ? potassium chloride  20 mEq Oral Once  ? ?Continuous Infusions: ? ?Procedures/Studies: ?DG Chest 2 View ? ?Result Date: 02/07/2022 ?CLINICAL DATA:  Chest pain and short of breath.  Leg swelling EXAM: CHEST - 2 VIEW COMPARISON:  10/30/2020 FINDINGS: Cardiac enlargement. Mild vascular congestion. Underlying chronic lung disease based on the prior study. Negative for edema. Small bilateral pleural effusions and mild bibasilar atelectasis. [place] vertebral augmentation at multiple levels in the thoracic and lumbar spine. Limited bony detail due to osteopenia. IMPRESSION: Cardiac enlargement without edema. Mild bibasilar  atelectasis and small effusions. Electronically Signed   By: Franchot Gallo M.D.   On: 02/07/2022 16:50  ? ?ECHOCARDIOGRAM COMPLETE ? ?Result Date: 02/08/2022 ?   ECHOCARDIOGRAM REPORT   Patient Name:   SHARMAYNE JABLON

## 2022-02-11 LAB — BASIC METABOLIC PANEL
Anion gap: 9 (ref 5–15)
BUN: 21 mg/dL (ref 8–23)
CO2: 36 mmol/L — ABNORMAL HIGH (ref 22–32)
Calcium: 8.3 mg/dL — ABNORMAL LOW (ref 8.9–10.3)
Chloride: 95 mmol/L — ABNORMAL LOW (ref 98–111)
Creatinine, Ser: 0.98 mg/dL (ref 0.44–1.00)
GFR, Estimated: 58 mL/min — ABNORMAL LOW (ref >60)
Glucose, Bld: 127 mg/dL — ABNORMAL HIGH (ref 70–99)
Potassium: 3.8 mmol/L (ref 3.5–5.1)
Sodium: 140 mmol/L (ref 135–145)

## 2022-02-11 LAB — MAGNESIUM: Magnesium: 1.9 mg/dL (ref 1.7–2.4)

## 2022-02-11 MED ORDER — SODIUM CHLORIDE 0.9 % IV SOLN
250.0000 mg | Freq: Once | INTRAVENOUS | Status: AC
  Administered 2022-02-11: 250 mg via INTRAVENOUS
  Filled 2022-02-11: qty 20

## 2022-02-11 MED ORDER — CALCIUM CARBONATE ANTACID 500 MG PO CHEW
2.0000 | CHEWABLE_TABLET | Freq: Once | ORAL | Status: AC
  Administered 2022-02-11: 400 mg via ORAL
  Filled 2022-02-11: qty 2

## 2022-02-11 MED ORDER — ALUM & MAG HYDROXIDE-SIMETH 200-200-20 MG/5ML PO SUSP
30.0000 mL | ORAL | Status: DC | PRN
  Administered 2022-02-11: 30 mL via ORAL
  Filled 2022-02-11: qty 30

## 2022-02-11 NOTE — Evaluation (Signed)
Physical Therapy Evaluation ?Patient Details ?Name: Misty [name] ?MRN: [id number] ?DOB: 10-23-1941 ?Today's Date: 02/11/2022 ? ?History of Present Illness ? Misty [name] is a 81 y.o. female with medical history significant for COPD, atrial fibrillation, systolic CHF, pulmonary fibrosis, anxiety and depression. ? ?  ?Clinical Impression ? Patient limited for functional mobility as stated below secondary to BLE weakness, fatigue and poor standing balance. Patient O2 sat monitored throughout session: at rest initially on 3 L with sat 100%, 2L with sat 100%, 92% at rest/with transition to seated EOB. O2 further decreased to high 80s on RA at rest without SOB.  She requires assist with mobility secondary to weakness and demonstrates poor standing tolerance. Patient limited by fatigue and states inability to ambulate due to generalized weakness. Patient will benefit from continued physical therapy in hospital and recommended venue below to increase strength, balance, endurance for safe ADLs and gait. ?   ?   ? ?Recommendations for follow up therapy are one component of a multi-disciplinary discharge planning process, led by the attending physician.  Recommendations may be updated based on patient status, additional functional criteria and insurance authorization. ? ?Follow Up Recommendations Skilled nursing-short term rehab (<3 hours/day) ? ?  ?Assistance Recommended at Discharge Frequent or constant Supervision/Assistance  ?Patient can return home with the following ? A lot of help with walking and/or transfers;A lot of help with bathing/dressing/bathroom;Assistance with cooking/housework;Assist for transportation;Help with stairs or ramp for entrance ? ?  ?Equipment Recommendations None recommended by PT  ?Recommendations for Other Services ?    ?  ?Functional Status Assessment Patient has had a recent decline in their functional status and demonstrates the ability to make significant improvements in function in a  reasonable and predictable amount of time.  ? ?  ?Precautions / Restrictions Precautions ?Precautions: Fall ?Restrictions ?Weight Bearing Restrictions: No  ? ?  ? ?Mobility ? Bed Mobility ?Overal bed mobility: Needs Assistance ?Bed Mobility: Supine to Sit, Sit to Supine ?  ?  ?Supine to sit: Min assist, HOB elevated ?Sit to supine: Min assist ?  ?General bed mobility comments: slow, labored, requires cueing for sequencing, assist for weakness ?  ? ?Transfers ?Overall transfer level: Needs assistance ?Equipment used: Rolling walker (2 wheels) ?Transfers: Sit to/from Stand ?Sit to Stand: Min assist, Mod assist ?  ?  ?  ?  ?  ?General transfer comment: labored, assist for weakness ?  ? ?Ambulation/Gait ?  ?  ?  ?  ?  ?  ?  ?  ? ?Stairs ?  ?  ?  ?  ?  ? ?Wheelchair Mobility ?  ? ?Modified Rankin (Stroke Patients Only) ?  ? ?  ? ?Balance Overall balance assessment: Needs assistance ?Sitting-balance support: No upper extremity supported, Feet supported ?Sitting balance-Leahy Scale: Good ?Sitting balance - Comments: seated EOB ?  ?Standing balance support: Bilateral upper extremity supported ?Standing balance-Leahy Scale: Poor ?Standing balance comment: with RW ?  ?  ?  ?  ?  ?  ?  ?  ?  ?  ?  ?   ? ? ? ?Pertinent Vitals/Pain Pain Assessment ?Pain Assessment: No/denies pain  ? ? ?Home Living Family/patient expects to be discharged to:: Private residence ?Living Arrangements: Spouse/significant other ?Available Help at Discharge: Family ?Type of Home: House ?  ?  ?  ?  ?Home Layout: One level ?Home Equipment: Rolling Walker (2 wheels);BSC/3in1;Shower seat;Cane - single point ?   ?  ?Prior Function Prior Level of Function :  Independent/Modified Independent ?  ?  ?  ?  ?  ?  ?Mobility Comments: states household ambulation without AD ?ADLs Comments: Patient states independent with basic ADL ?  ? ? ?Hand Dominance  ?   ? ?  ?Extremity/Trunk Assessment  ? Upper Extremity Assessment ?Upper Extremity Assessment: Generalized  weakness ?  ? ?Lower Extremity Assessment ?Lower Extremity Assessment: Generalized weakness ?  ? ?Cervical / Trunk Assessment ?Cervical / Trunk Assessment: Kyphotic  ?Communication  ? Communication: No difficulties  ?Cognition Arousal/Alertness: Awake/alert ?Behavior During Therapy: [facility] for tasks assessed/performed ?Overall Cognitive Status: Within Functional Limits for tasks assessed ?  ?  ?  ?  ?  ?  ?  ?  ?  ?  ?  ?  ?  ?  ?  ?  ?  ?  ?  ? ?  ?General Comments   ? ?  ?Exercises    ? ?Assessment/Plan  ?  ?PT Assessment Patient needs continued PT services  ?PT Problem List Decreased strength;Decreased mobility;Decreased activity tolerance;Decreased balance ? ?   ?  ?PT Treatment Interventions DME instruction;Therapeutic exercise;Gait training;Balance training;Stair training;Neuromuscular re-education;Functional mobility training;Therapeutic activities;Patient/family education   ? ?PT Goals (Current goals can be found in the Care Plan section)  ?Acute Rehab PT Goals ?Patient Stated Goal: return home ?PT Goal Formulation: With patient ?Time For Goal Achievement: 02/25/22 ?Potential to Achieve Goals: Good ? ?  ?Frequency Min 3X/week ?  ? ? ?Co-evaluation   ?  ?  ?  ?  ? ? ?  ?AM-PAC PT "6 Clicks" Mobility  ?Outcome Measure Help needed turning from your back to your side while in a flat bed without using bedrails?: A Little ?Help needed moving from lying on your back to sitting on the side of a flat bed without using bedrails?: A Lot ?Help needed moving to and from a bed to a chair (including a wheelchair)?: A Lot ?Help needed standing up from a chair using your arms (e.g., wheelchair or bedside chair)?: A Lot ?Help needed to walk in hospital room?: A Lot ?Help needed climbing 3-5 steps with a railing? : A Lot ?6 Click Score: 13 ? ?  ?End of Session Equipment Utilized During Treatment: Gait belt ?Activity Tolerance: Patient limited by fatigue ?Patient left: in bed;with call bell/phone within reach;with bed alarm  set ?Nurse Communication: Mobility status ?PT Visit Diagnosis: Unsteadiness on feet (R26.81);Other abnormalities of gait and mobility (R26.89) ?  ? ?Time: 8032-1224 ?PT Time Calculation (min) (ACUTE ONLY): 18 min ? ? ?Charges:   PT Evaluation ?$PT Eval Moderate Complexity: 1 Mod ?PT Treatments ?$Therapeutic Activity: 8-22 mins ?  ?   ? ? ?12:21 PM, 02/11/22 ?Mearl Latin PT, DPT ?Physical Therapist at [facility] ?[facility] ? ? ?

## 2022-02-11 NOTE — Progress Notes (Signed)
? ?Progress Note ? ?Patient Name: Misty [name] ?Date of Encounter: 02/11/2022 ? ?[facility] Cardiologist: Minus Breeding, MD  ? ?Subjective  ? ?Last night at about 1 AM was yelling thought that her husband was next to her but he was not.  See nursing note.  Currently coherent this morning.  Talking about how she has smoked all her life.  Breathing seems to be comfortable.  She is pleased that her swelling has gone down in her legs. ? ?Inpatient Medications  ?  ?Scheduled Meds: ? bisoprolol  5 mg Oral Daily  ? budesonide (PULMICORT) nebulizer solution  0.5 mg Nebulization BID  ? enoxaparin (LOVENOX) injection  40 mg Subcutaneous B16O  ? folic acid  1 mg Oral Daily  ? furosemide  40 mg Intravenous BID  ? ipratropium  0.5 mg Nebulization BID  ? levalbuterol  0.63 mg Nebulization BID  ? losartan  25 mg Oral Daily  ? nicotine  14 mg Transdermal Daily  ? ?Continuous Infusions: ? ?PRN Meds: ?acetaminophen **OR** acetaminophen, ALPRAZolam, ondansetron **OR** ondansetron (ZOFRAN) IV, polyethylene glycol, traZODone  ? ?Vital Signs  ?  ?Vitals:  ? 02/10/22 2118 02/11/22 0600 02/11/22 0724 02/11/22 0728  ?BP: (!) 104/51 90/70    ?Pulse: 66 (!) 129 99   ?Resp:  20    ?Temp:  98.1 ?F (36.7 ?C)    ?TempSrc:  Oral    ?SpO2: 94% 96%  96%  ?Weight:      ?Height:      ? ? ?Intake/Output Summary (Last 24 hours) at 02/11/2022 0838 ?Last data filed at 02/11/2022 4599 ?Gross per 24 hour  ?Intake 660 ml  ?Output 1300 ml  ?Net -640 ml  ? ? ?  02/10/2022  ?  5:43 AM 02/09/2022  ?  5:28 AM 02/08/2022  ?  5:00 AM  ?Last 3 Weights  ?Weight (lbs) 133 lb 2.5 oz 136 lb 3.9 oz 147 lb 14.9 oz  ?Weight (kg) 60.4 kg 61.8 kg 67.1 kg  ?   ? ?Telemetry  ?  ?Atrial fibrillation heart rate in the low 100s- Personally Reviewed ? ?ECG  ?  ?Original EKG here on 02/07/2022 likely 2-1 atrial flutter- Personally Reviewed ? ?Physical Exam  ? ?GEN: No acute distress.  Elderly ?Neck: No JVD ?Cardiac: Irregularly irregular, mildly tachycardic, no murmurs, rubs, or  gallops.  ?Respiratory: Clear to auscultation bilaterally. ?GI: Soft, nontender, non-distended  ?MS: No edema; No deformity. ?Neuro:  Nonfocal  ?Psych: Normal affect  ? ?Labs  ?  ?High Sensitivity Troponin:   ?Recent Labs  ?Lab 02/07/22 ?1549 02/07/22 ?1804  ?TROPONINIHS 152* 171*  ?   ?Chemistry ?Recent Labs  ?Lab 02/07/22 ?1549 02/08/22 ?0433 02/09/22 ?7741 02/10/22 ?0413 02/11/22 ?0559  ?NA 141   < > 143 142 140  ?K 3.9   < > 3.8 3.4* 3.8  ?CL 104   < > 98 96* 95*  ?CO2 27   < > 38* 38* 36*  ?GLUCOSE 137*   < > 104* 108* 127*  ?BUN 24*   < > 22 21 21   ?CREATININE 1.02*   < > 1.01* 1.10* 0.98  ?CALCIUM 8.5*   < > 7.9* 7.9* 8.3*  ?MG  --   --  1.5* 2.0 1.9  ?PROT 5.8*  --   --   --   --   ?ALBUMIN 3.1*  --   --   --   --   ?AST 26  --   --   --   --   ?  ALT 22  --   --   --   --   ?ALKPHOS 81  --   --   --   --   ?BILITOT 0.5  --   --   --   --   ?GFRNONAA 56*   < > 56* 51* 58*  ?ANIONGAP 10   < > 7 8 9   ? < > = values in this interval not displayed.  ?  ?Lipids No results for input(s): CHOL, TRIG, HDL, LABVLDL, LDLCALC, CHOLHDL in the last 168 hours.  ?Hematology ?Recent Labs  ?Lab 02/07/22 ?1549 02/08/22 ?0433  ?WBC 7.0 7.1  ?RBC 3.45* 3.53*  ?HGB 8.4* 8.5*  ?HCT 31.2* 31.9*  ?MCV 90.4 90.4  ?MCH 24.3* 24.1*  ?MCHC 26.9* 26.6*  ?RDW 22.5* 22.5*  ?PLT 187 160  ? ?Thyroid No results for input(s): TSH, FREET4 in the last 168 hours.  ?BNP ?Recent Labs  ?Lab 02/07/22 ?1549  ?BNP 2,535.0*  ?  ?DDimer No results for input(s): DDIMER in the last 168 hours.  ? ?Radiology  ?  ?No results found. ? ?Cardiac Studies  ? ? ? 1. Left ventricular ejection fraction, by estimation, is 35 to 40%. The  ?left ventricle has moderately decreased function. The left ventricle  ?demonstrates global hypokinesis. There is moderate left ventricular  ?hypertrophy. Left ventricular diastolic  ?parameters are indeterminate.  ? 2. Right ventricular systolic function is normal. The right ventricular  ?size is mildly enlarged. There is severely  elevated pulmonary artery  ?systolic pressure. The estimated right ventricular systolic pressure is  ?09.6 mmHg.  ? 3. Left atrial size was severely dilated.  ? 4. Right atrial size was severely dilated.  ? 5. The mitral valve is abnormal. Moderate mitral valve regurgitation.  ? 6. Tricuspid valve regurgitation is mild to moderate.  ? 7. The aortic valve is tricuspid. Aortic valve regurgitation is trivial.  ?Aortic valve sclerosis is present, with no evidence of aortic valve  ?stenosis.  ? 8. The inferior vena cava is dilated in size with <50% respiratory  ?variability, suggesting right atrial pressure of 15 mmHg.  ? ?  ?R/LHC: 03/2019 ?1. Mild nonobstructive CAD, calcified coronary arteries ?2. Normal right and left heart filling pressures ?  ?Patient Profile  ?   ?81 y.o. female with acute on chronic systolic heart failure, secondary pulmonary hypertension, paroxysmal atrial fibrillation, coronary artery disease with elevated troponin, anemia, tobacco use admitted with shortness of breath. ? ?Assessment & Plan  ?  ?Acute on chronic systolic heart failure ?- Worsening dyspnea over the past 2-3 weeks. ?-Has been on bisoprolol 5 mg a day as well as losartan 25 mg a day.  In the emergency room received IV Lasix 20 mg x 2 and then was started on IV Lasix 40 mg twice daily. ?- Baseline weight 123 pounds from October 2022 office visit.  Admit weight 147, current weight 133. 6.7 L out total ?- Continue with Lasix 40 mg twice daily IV.  I think we are getting close to euvolemia.  Renal function has remained stable. ?- EF stable on repeat echo this admission 35 to 40%.  Unfortunately she does not have blood pressure room to utilize Entresto or SGLT2 inhibitor or spironolactone.  We will continue with low-dose bisoprolol as well as losartan. ? ?Paroxysmal atrial fibrillation ?- Unfortunately has not been able to tolerate anticoagulation secondary to her ongoing chronic anemia.  She has not been interested in the past and  [place] device.  Previously discussed.  Continues with normal sinus rhythm. ?- CHA2DS2-VASc score is 6 (heart failure hypertension vascular disease female age greater than 13). ? ?Coronary artery disease ?- Cardiac catheterization from 2020 personally reviewed, mild nonobstructive coronary artery disease with calcified coronary arteries.  Interestingly she had normal right and left heart filling pressures at that time.  Currently echocardiogram demonstrates secondary pulmonary hypertension. ?-She is allergic to aspirin has been intolerant to statins because of myalgia.  Consider readdressing as outpatient. ? ?Elevated troponin ?- Secondary to demand ischemia in the setting of heart failure with troponin of 1 52-1 71. ? ?Chronic anemia ?- Hemoglobin was 8.5 earlier.  Close to her normal baseline.  No active bleeding. ? ?Tobacco use ?- Continues smoke about 1 pack/day.  Cessation advised. ? ?Delirium ?- Xanax utilized, primary team.  The quicker we can get her to her home environment the better. ? ? ?Failure to thrive/social determinants of health ?- Felt very weak in her legs husband would follow behind because she was worried she would fall.  Orthopnea. ?-Husband with dementia hard of hearing difficulty at home. ? ? ?For questions or updates, please contact [place] ?Please consult [email or web address] for contact info under  ? ?  ?   ?Signed, ?Candee Furbish, MD  ?02/11/2022, 8:38 AM   ? ?

## 2022-02-11 NOTE — Progress Notes (Addendum)
Pt lying in bed wit legs handing over rails, yelling out stating she is trying to wake her husband up that pt claims is sitting in empty chair by the window. This LPN reassured pt that there was no one sitting in the chair and verbally redirected the pt that she is at [facility], and is safe. This LPN got pt back to the middle of the bed and adjusted the covers and sat and talked with the pt, which calmed her to the point where she wasn't climbing out of the bed.Pt requesting "something to help calm her nerves" adimistered the PRN ALPRAZolam Duanne Moron) tablet 0.5 mg    ? Put the TV on one of the meditation channels with the call bell in reach and will continue to monitor ?

## 2022-02-11 NOTE — Progress Notes (Signed)
?   02/11/22 4497  ?Assess: MEWS Score  ?Temp 98.1 ?F (36.7 ?C)  ?BP 90/70  ?Pulse Rate (!) 129  ?Resp 20  ?SpO2 96 %  ?Assess: MEWS Score  ?MEWS Temp 0  ?MEWS Systolic 1  ?MEWS Pulse 2  ?MEWS RR 0  ?MEWS LOC 0  ?MEWS Score 3  ?MEWS Score Color Yellow  ?Assess: if the MEWS score is Yellow or Red  ?Were vital signs taken at a resting state? No ?(pt agitated)  ?Focused Assessment No change from prior assessment  ?Early Detection of Sepsis Score *See Row Information* Low  ?MEWS guidelines implemented *See Row Information* Yes  ?Take Vital Signs  ?Increase Vital Sign Frequency  Yellow: Q 2hr X 2 then Q 4hr X 2, if remains yellow, continue Q 4hrs  ?Escalate  ?MEWS: Escalate Yellow: discuss with charge nurse/RN and consider discussing with provider and RRT  ?Notify: Charge Nurse/RN  ?Name of Charge Nurse/RN Notified Deeann Dowse RN  ?Date Charge Nurse/RN Notified 02/11/22  ?Time Charge Nurse/RN Notified [phone]  ? ? ?

## 2022-02-11 NOTE — Progress Notes (Addendum)
?  ?       ?PROGRESS NOTE ? ?Misty [name] PTW:[id number] DOB: October 26, 1941 DOA: 02/07/2022 ?PCP: Claretta Fraise, MD ? ?Brief History:  ?81 year old female with a history of paroxysmal atrial fibrillation, hyperlipidemia, systolic CHF, anxiety, COPD, tobacco abuse, pulmonary fibrosis, nonobstructive CAD presenting with approximately 1 to 2-week history of worsening shortness of breath.  The patient states that she has gained over 10 pounds since her PCP visit on 01/30/2022.  Her weight at that time was 137 pounds.  She states that at baseline she weighs in the low 120s.  She complains of increasing lower extremity edema, orthopnea type symptoms.  In addition, the patient has had increasing generalized weakness.  She denies any fevers, chills, headache, neck pain, chest pain, nausea, vomiting, diarrhea.  She endorses compliance with her medications.  She last saw cardiology, ? Dr. Percival Spanish, on October/2022 at which time her weight was 123 pounds.  At the time of this admission, her weight was 147.9 pounds.  The patient endorses compliance with her medications.  However review of the past notes do say that the patient has been hesitant to make medication changes.  At the time of her last cardiology appointment, losartan was increased to 25 mg daily and the patient was continued on bisoprolol.  She has been intolerant to anticoagulation secondary to her anemia, and she was not interested in the [place] device. ?In the ED, the patient was afebrile hemodynamically stable.  WBC 7.0, hemoglobin 8.4, platelets 187,000.  Sodium 141, potassium 3.9, serum creatinine 1.02, BNP 2535.  Initial troponin 152>>171.  The patient was given 2 doses of IV furosemide 20 mg x 2.  She was started on furosemide 40 mg IV twice daily and cardiology was consulted to assist with management. ? ?3/25--breathing better, but continues to have generalized weakness.  Diuresing well with lasix 40 IV bid with 2400 CC out last 24 hours and neg  8.5lbs. ? ?3/26--having some hospital delirium in last 24 hours.  Continue home xanax.  Prn ativan.  Diuresing well.  Now neg 14 lbs. ? ?3/27--occasional delirium.  Diuresing well. Denies f/c, cp, n/v/d.  Start weaning off oxygen.  Possible transition to po lasix 3/28  ? ? ?Assessment and Plan: ?* Acute HFrEF (heart failure with reduced ejection fraction) ([place]) ?Dry weight appears to be 123 ?Presented with weight 247 pounds ?Remains clinically fluid overloaded +JVD ?-Continue IV Lasix 40 twice daily ?-03/26/2019 echo EF 30-35%, diffuse HK, moderate MR, mild TR ?-3/25 echo--EF 35-40, global HK, RVSP 62.1 ?-Strict input output, daily weights, daily BMP ?-continue duo nebs ?-continue Pulmicort ?-continue bisoprolol and losartan ?-NEG 14 lbs ? ?Acute respiratory failure with hypoxia ([place]) ?O2 sats 87% on room air.  ?Secondary to pulmonary edema and COPD ?Stable on 2L ?Wean for saturation >92% ?Personally reviewed CXR--chronic interstitial markings ?-Strict input output, daily weights, daily BMP ?-continue duo nebs ?-continue Pulmicort ? ?Paroxysmal atrial fibrillation (HCC) ?Currently in sinus rhythm ?CHADSVASc = 6 ?Intolerant to anticoagulation secondary to anemia ?Has not been interested in the [place] device ?Continue bisoprolol home dose ? ?Elevated troponin ?Troponins 152>> 171 ?Secondary to demand ischemia in the setting of CHF exacerbation ?No recent chest pain ?Repeat echo as above ?Listed allergy to aspirin and intolerance to statins ? ?Demand ischemia ([place]) ?Elevated troponins flat ?No chest pain ? ?Anemia ?Hemoglobin 8.4 on presentation ?Baseline Hgb 8-9 ?Iron sat 6%, ferritin 14>>nulecit x 1, repeat dose on 3/27 ?B12--943 ?Folate 5.0>>replete ?No active bleeding presently ? ?Anxiety and depression ?Continue home  dose alprazolam ?PDMP reviewed patient receives monthly alprazolam 0.5 mg, #60 ? ?Tobacco use disorder ?Tobacco cessation discussed ? ? ? ?Status is: Inpatient ?Remains inpatient appropriate because:  due to severity of illness requiring IV lasix and new oxygen requirement ?  ?  ?  ?Family Communication:   daughter in law updated 3/27 ?Consultants:  cardiology ?  ?Code Status:  DNR ?  ?DVT Prophylaxis:  [place] Lovenox ?  ?  ?Procedures: ?As Listed in Progress Note Above ?  ?Antibiotics: ?None ?  ? ? ? ? ? ? ? ?Subjective: ?Patient denies fevers, chills, headache, chest pain, dyspnea, nausea, vomiting, diarrhea, abdominal pain, dysuria ? ? ?Objective: ?Vitals:  ? 02/11/22 0728 02/11/22 0851 02/11/22 1029 02/11/22 1450  ?BP:  125/75 (!) 107/55 102/61  ?Pulse:  99 100 75  ?Resp:  18 18 20   ?Temp:   97.6 ?F (36.4 ?C) 98.1 ?F (36.7 ?C)  ?TempSrc:   Oral Oral  ?SpO2: 96% 98% 100% 93%  ?Weight:      ?Height:      ? ? ?Intake/Output Summary (Last 24 hours) at 02/11/2022 1653 ?Last data filed at 02/11/2022 0900 ?Gross per 24 hour  ?Intake 480 ml  ?Output 400 ml  ?Net 80 ml  ? ?Weight change:  ?Exam: ? ?General:  Pt is alert, follows commands appropriately, not in acute distress ?HEENT: No icterus, No thrush, No neck mass, [place]/AT ?Cardiovascular: RRR, S1/S2, no rubs, no gallops ?Respiratory: bibasilar rales. Diminished BS.  No wheeze ?Abdomen: Soft/+BS, non tender, non distended, no guarding ?Extremities: No edema, No lymphangitis, No petechiae, No rashes, no synovitis ? ? ?Data Reviewed: ?I have personally reviewed following labs and imaging studies ?Basic Metabolic Panel: ?Recent Labs  ?Lab 02/07/22 ?1549 02/08/22 ?0433 02/09/22 ?9629 02/10/22 ?0413 02/11/22 ?0559  ?NA 141 142 143 142 140  ?K 3.9 3.8 3.8 3.4* 3.8  ?CL 104 101 98 96* 95*  ?CO2 27 33* 38* 38* 36*  ?GLUCOSE 137* 119* 104* 108* 127*  ?BUN 24* 23 22 21 21   ?CREATININE 1.02* 1.03* 1.01* 1.10* 0.98  ?CALCIUM 8.5* 8.2* 7.9* 7.9* 8.3*  ?MG  --   --  1.5* 2.0 1.9  ? ?Liver Function Tests: ?Recent Labs  ?Lab 02/07/22 ?1549  ?AST 26  ?ALT 22  ?ALKPHOS 81  ?BILITOT 0.5  ?PROT 5.8*  ?ALBUMIN 3.1*  ? ?No results for input(s): LIPASE, AMYLASE in the last 168 hours. ?No results  for input(s): AMMONIA in the last 168 hours. ?Coagulation Profile: ?No results for input(s): INR, PROTIME in the last 168 hours. ?CBC: ?Recent Labs  ?Lab 02/07/22 ?1549 02/08/22 ?0433  ?WBC 7.0 7.1  ?NEUTROABS 5.5  --   ?HGB 8.4* 8.5*  ?HCT 31.2* 31.9*  ?MCV 90.4 90.4  ?PLT 187 160  ? ?Cardiac Enzymes: ?No results for input(s): CKTOTAL, CKMB, CKMBINDEX, TROPONINI in the last 168 hours. ?BNP: ?Invalid input(s): POCBNP ?CBG: ?No results for input(s): GLUCAP in the last 168 hours. ?HbA1C: ?No results for input(s): HGBA1C in the last 72 hours. ?Urine analysis: ?   ?Component Value Date/Time  ? COLORURINE YELLOW 01/14/2019 0226  ? APPEARANCEUR CLEAR 01/14/2019 0226  ? LABSPEC 1.012 01/14/2019 0226  ? PHURINE 7.0 01/14/2019 0226  ? GLUCOSEU NEGATIVE 01/14/2019 0226  ? [place] NEGATIVE 01/14/2019 0226  ? [place] NEGATIVE 01/14/2019 0226  ? [place] NEGATIVE 01/14/2019 0226  ? PROTEINUR NEGATIVE 01/14/2019 0226  ? NITRITE NEGATIVE 01/14/2019 0226  ? LEUKOCYTESUR NEGATIVE 01/14/2019 0226  ? ?Sepsis Labs: ?@LABRCNTIP (procalcitonin:4,lacticidven:4) ?)No results found for this  or any previous visit (from the past 240 hour(s)).  ? ?Scheduled Meds: ? bisoprolol  5 mg Oral Daily  ? budesonide (PULMICORT) nebulizer solution  0.5 mg Nebulization BID  ? enoxaparin (LOVENOX) injection  40 mg Subcutaneous W11Y  ? folic acid  1 mg Oral Daily  ? furosemide  40 mg Intravenous BID  ? ipratropium  0.5 mg Nebulization BID  ? levalbuterol  0.63 mg Nebulization BID  ? losartan  25 mg Oral Daily  ? nicotine  14 mg Transdermal Daily  ? ?Continuous Infusions: ? ferric gluconate (FERRLECIT) IVPB    ? ? ?Procedures/Studies: ?DG Chest 2 View ? ?Result Date: 02/07/2022 ?CLINICAL DATA:  Chest pain and short of breath.  Leg swelling EXAM: CHEST - 2 VIEW COMPARISON:  10/30/2020 FINDINGS: Cardiac enlargement. Mild vascular congestion. Underlying chronic lung disease based on the prior study. Negative for edema. Small bilateral pleural effusions and  mild bibasilar atelectasis. [place] vertebral augmentation at multiple levels in the thoracic and lumbar spine. Limited bony detail due to osteopenia. IMPRESSION: Cardiac enlargement without edema. Mild bibasilar

## 2022-02-11 NOTE — Plan of Care (Signed)
?  Problem: Acute Rehab PT Goals(only PT should resolve) ?Goal: Pt Will Go Supine/Side To Sit ?Outcome: Progressing ?Flowsheets (Taken 02/11/2022 1228) ?Pt will go Supine/Side to Sit: ? with supervision ? with min guard assist ?Goal: Pt Will Go Sit To Supine/Side ?Outcome: Progressing ?Flowsheets (Taken 02/11/2022 1228) ?Pt will go Sit to Supine/Side: ? with supervision ? with min guard assist ?Goal: Patient Will Transfer Sit To/From Stand ?Outcome: Progressing ?Flowsheets (Taken 02/11/2022 1228) ?Patient will transfer sit to/from stand: ? with supervision ? with min guard assist ?Goal: Pt Will Transfer Bed To Chair/Chair To Bed ?Outcome: Progressing ?Flowsheets (Taken 02/11/2022 1228) ?Pt will Transfer Bed to Chair/Chair to Bed: ? with supervision ? min guard assist ?Goal: Pt Will Ambulate ?Outcome: Progressing ?Flowsheets (Taken 02/11/2022 1228) ?Pt will Ambulate: ? 25 feet ? with min guard assist ? with least restrictive assistive device ?Goal: Pt/caregiver will Perform Home Exercise Program ?Outcome: Progressing ?Flowsheets (Taken 02/11/2022 1228) ?Pt/caregiver will Perform Home Exercise Program: ? For increased strengthening ? For improved balance ? Independently ? 12:29 PM, 02/11/22 ?Mearl Latin PT, DPT ?Physical Therapist at [facility] ?[facility] ? ?

## 2022-02-11 NOTE — TOC Progression Note (Signed)
Transition of Care (TOC) - Progression Note  ? ? ?Patient Details  ?Name: KAMILLA HANDS ?MRN: [id number] ?Date of Birth: 05-04-1941 ? ?Transition of Care (TOC) CM/SW Contact  ?Boneta Lucks, RN ?Phone Number: ?02/11/2022, 4:06 PM ? ?Clinical Narrative:   PT is recommending SNF. Patient lives with her Spouse. Wanting to return home. TOC to follow for home health referral.  ? ? ?Expected Discharge Plan: [place] ?Barriers to Discharge: Continued Medical Work up ? ?Expected Discharge Plan and Services ?Expected Discharge Plan: [place] ?   ?  ?Living arrangements for the past 2 months: [place] ?                ?  ?Readmission Risk Interventions ? ?  02/08/2022  ?  3:31 PM 10/04/2019  ?  1:16 PM 08/12/2019  ?  1:28 PM  ?Readmission Risk Prevention Plan  ?Medication Screening Complete    ?Transportation Screening Complete Complete Complete  ?Medication Review Press photographer)  Complete Complete  ?PCP or Specialist appointment within 3-5 days of discharge  Not Complete Complete  ?PCP/Specialist Appt Not Complete comments  Earliest appointment is 8 days out.   ?[place] or Home Care Consult  Complete Complete  ?SW Recovery Care/Counseling Consult  Complete Complete  ?Palliative Care Screening  Not Applicable Not Complete  ?Skilled Nursing Facility  Not Applicable Not Complete  ? ? ?

## 2022-02-12 DIAGNOSIS — R410 Disorientation, unspecified: Secondary | ICD-10-CM

## 2022-02-12 DIAGNOSIS — F419 Anxiety disorder, unspecified: Secondary | ICD-10-CM | POA: Diagnosis not present

## 2022-02-12 DIAGNOSIS — F32A Depression, unspecified: Secondary | ICD-10-CM | POA: Diagnosis not present

## 2022-02-12 DIAGNOSIS — J9601 Acute respiratory failure with hypoxia: Secondary | ICD-10-CM | POA: Diagnosis not present

## 2022-02-12 DIAGNOSIS — I5021 Acute systolic (congestive) heart failure: Secondary | ICD-10-CM | POA: Diagnosis not present

## 2022-02-12 LAB — BASIC METABOLIC PANEL
Anion gap: 8 (ref 5–15)
BUN: 25 mg/dL — ABNORMAL HIGH (ref 8–23)
CO2: 38 mmol/L — ABNORMAL HIGH (ref 22–32)
Calcium: 8.5 mg/dL — ABNORMAL LOW (ref 8.9–10.3)
Chloride: 94 mmol/L — ABNORMAL LOW (ref 98–111)
Creatinine, Ser: 1 mg/dL (ref 0.44–1.00)
GFR, Estimated: 57 mL/min — ABNORMAL LOW (ref >60)
Glucose, Bld: 126 mg/dL — ABNORMAL HIGH (ref 70–99)
Potassium: 3.6 mmol/L (ref 3.5–5.1)
Sodium: 140 mmol/L (ref 135–145)

## 2022-02-12 LAB — URINALYSIS, COMPLETE (UACMP) WITH MICROSCOPIC
Bacteria, UA: NONE SEEN
Bilirubin Urine: NEGATIVE
Glucose, UA: NEGATIVE mg/dL
Hgb urine dipstick: NEGATIVE
Ketones, ur: NEGATIVE mg/dL
Leukocytes,Ua: NEGATIVE
Nitrite: NEGATIVE
Protein, ur: NEGATIVE mg/dL
Specific Gravity, Urine: 1.006 (ref 1.005–1.030)
pH: 9 — ABNORMAL HIGH (ref 5.0–8.0)

## 2022-02-12 LAB — MAGNESIUM: Magnesium: 2.1 mg/dL (ref 1.7–2.4)

## 2022-02-12 MED ORDER — FUROSEMIDE 40 MG PO TABS: 40.0000 mg | ORAL_TABLET | Freq: Every day | ORAL | Status: DC

## 2022-02-12 MED ORDER — FUROSEMIDE 40 MG PO TABS: 40.0000 mg | ORAL_TABLET | Freq: Every day | ORAL | 2 refills | Status: DC

## 2022-02-12 MED ORDER — FOLIC ACID 1 MG PO TABS: 1.0000 mg | ORAL_TABLET | Freq: Every day | ORAL | Status: AC

## 2022-02-12 NOTE — Assessment & Plan Note (Signed)
Daughter in law states it is not unusual for patient to have hospital delirium ?She states mental status usually improves once pt returns home ?

## 2022-02-12 NOTE — TOC Transition Note (Signed)
Transition of Care (TOC) - CM/SW Discharge Note ? ? ?Patient Details  ?Name: Misty [name] ?MRN: [id number] ?Date of Birth: 07-25-1941 ? ?Transition of Care (TOC) CM/SW Contact:  ?Boneta Lucks, RN ?Phone Number: ?02/12/2022, 11:31 AM ? ? ?Clinical Narrative:  Patient discharging home today. Agreeeable to HHPT/RN. Lattie Haw with [facility] accepting the referral. Patient needing home oxygen at 3L. Caryl Pina with [facility] accepting the referral.   ? ? ?Final next level of care: [place] ?Barriers to Discharge: Barriers Resolved ? ? ?Patient Goals and CMS Choice ?Patient states their goals for this hospitalization and ongoing recovery are:: to go home. ?CMS Medicare.gov Compare Post Acute Care list provided to:: Patient ?Choice offered to / list presented to : Patient ? ?Discharge Placement ?         ?  ?Patient to be transferred to facility by: family ?Name of family member notified: Misty [name] ?Patient and family notified of of transfer: 02/12/22 ? ?Discharge Plan and Services ?         ?  ?DME Agency: [facility] ?Date DME Agency Contacted: 02/12/22 ?Time DME Agency Contacted: [phone] ?Representative spoke with at DME Agency: Desiree Hane ?HH Arranged: RN, PT ?[place] Agency: [place] ?Date HH Agency Contacted: 02/12/22 ?Time [place]: 1100 ?Representative spoke with at [place]: Penni Homans ?Readmission Risk Interventions ? ?  02/12/2022  ? 11:30 AM 02/08/2022  ?  3:31 PM 10/04/2019  ?  1:16 PM  ?Readmission Risk Prevention Plan  ?Medication Screening  Complete   ?Transportation Screening Complete Complete Complete  ?PCP or Specialist Appt within 5-7 Days Not Complete    ?Home Care Screening Complete    ?Medication Review (RN CM) Complete    ?Medication Review Press photographer)   Complete  ?PCP or Specialist appointment within 3-5 days of discharge   Not Complete  ?PCP/Specialist Appt Not Complete comments   Earliest appointment is 8 days out.  ?[place] or Home Care Consult   Complete  ?SW Recovery  Care/Counseling Consult   Complete  ?Palliative Care Screening   Not Applicable  ?[place]   Not Applicable  ? ? ? ? ? ?

## 2022-02-12 NOTE — Discharge Summary (Signed)
?Physician Discharge Summary ?  ?Patient: Misty [name] MRN: [id number] DOB: May 08, 1941  ?Admit date:     02/07/2022  ?Discharge date: 02/12/22  ?Discharge Physician: Onalee Hua Branko Steeves  ? ?PCP: Mechele Claude, MD  ? ?Recommendations at discharge:  ? Please follow up with primary care provider within 1-2 weeks ? Please repeat BMP and ? ? ? ? ?Hospital Course: ?81 year old female with a history of paroxysmal atrial fibrillation, hyperlipidemia, systolic CHF, anxiety, COPD, tobacco abuse, pulmonary fibrosis, nonobstructive CAD presenting with approximately 1 to 2-week history of worsening shortness of breath.  The patient states that she has gained over 10 pounds since her PCP visit on 01/30/2022.  Her weight at that time was 137 pounds.  She states that at baseline she weighs in the low 120s.  She complains of increasing lower extremity edema, orthopnea type symptoms.  In addition, the patient has had increasing generalized weakness.  She denies any fevers, chills, headache, neck pain, chest pain, nausea, vomiting, diarrhea.  She endorses compliance with her medications.  She last saw cardiology, ? Dr. Antoine Poche, on October/2022 at which time her weight was 123 pounds.  At the time of this admission, her weight was 147.9 pounds.  The patient endorses compliance with her medications.  However review of the past notes do say that the patient has been hesitant to make medication changes.  At the time of her last cardiology appointment, losartan was increased to 25 mg daily and the patient was continued on bisoprolol.  She has been intolerant to anticoagulation secondary to her anemia, and she was not interested in the [place] device. ?In the ED, the patient was afebrile hemodynamically stable.  WBC 7.0, hemoglobin 8.4, platelets 187,000.  Sodium 141, potassium 3.9, serum creatinine 1.02, BNP 2535.  Initial troponin 152>>171.  The patient was given 2 doses of IV furosemide 20 mg x 2.  She was started on furosemide 40 mg IV twice  daily and cardiology was consulted to assist with management. ? ?3/25--breathing better, but continues to have generalized weakness.  Diuresing well with lasix 40 IV bid with 2400 CC out last 24 hours and neg 8.5lbs. ? ?3/26--having some hospital delirium in last 24 hours.  Continue home xanax.  Prn ativan.  Diuresing well.  Now neg 14 lbs. ? ?3/27--occasional delirium.  Diuresing well. Denies f/c, cp, n/v/d.  Start weaning off oxygen.  Possible transition to po lasix 3/28 ? ?3/28--cleared by cardiology for d/c.  Pt states no cp or sob.  Will need 3L home oxygen.  HHPT, RN set up.  Daughter in law states pt frequently has hospital delirium which normally improves at home. ? ?Assessment and Plan: ?* Acute HFrEF (heart failure with reduced ejection fraction) (HCC) ?Dry weight appears to be 123 ?Presented with weight 247 pounds ?Remains clinically fluid overloaded +JVD ?-Continue IV Lasix 40 twice daily>>d/c home with lasix 40 mg po daily ?-03/26/2019 echo EF 30-35%, diffuse HK, moderate MR, mild TR ?-3/25 echo--EF 35-40, global HK, RVSP 62.1 ?-Strict input output, daily weights, daily BMP ?-continue duo nebs ?-continue Pulmicort ?-continue bisoprolol and losartan ?-NEG 24 lbs ?-discharge weight 123.9lbs ? ?Acute respiratory failure with hypoxia (HCC) ?O2 sats 87% on room air.  ?Secondary to pulmonary edema and COPD ?Stable on 2L ?Wean for saturation >92% ?Personally reviewed CXR--chronic interstitial markings ?-Strict input output, daily weights, daily BMP ?-continue duo nebs ?-continue Pulmicort ?Patient will be set up with home oxygen>>3L ? ?Paroxysmal atrial fibrillation (HCC) ?CHADSVASc = 6 ?Intolerant to anticoagulation secondary to anemia ?  Has not been interested in the [place] device ?Continue bisoprolol home dose ?Rate controlled ? ?Elevated troponin ?Troponins 152>> 171 ?Secondary to demand ischemia in the setting of CHF exacerbation ?No recent chest pain ?Repeat echo as above ?Listed allergy to aspirin and  intolerance to statins ? ?Delirium ?Daughter in law states it is not unusual for patient to have hospital delirium ?She states mental status usually improves once pt returns home ? ?Demand ischemia (HCC) ?Elevated troponins flat ?No chest pain ? ?Anemia ?Hemoglobin 8.4 on presentation ?Baseline Hgb 8-9 ?Iron sat 6%, ferritin 14>>nulecit x 1, repeat dose on 3/27 ?B12--943 ?Folate 5.0>>replete ?No active bleeding presently ? ?Anxiety and depression ?Continue home dose alprazolam ?PDMP reviewed patient receives monthly alprazolam 0.5 mg, #60 ? ?Tobacco use disorder ?Tobacco cessation discussed ? ? ? ? ?  ? ? ?Consultants: cardiology  ?Procedures performed: none  ?Disposition: Home ?Diet recommendation:  ?Cardiac diet ?DISCHARGE MEDICATION: ?Allergies as of 02/12/2022   ? ?   Reactions  ? Codeine Nausea And Vomiting  ? Tramadol Nausea And Vomiting  ? Asa [aspirin] Other (See Comments)  ? Patient is unaware of allergy  ? Azithromycin Other (See Comments)  ? Patient is unaware of allergy  ? Celebrex [celecoxib] Other (See Comments)  ? Irritated stomach  ? Cymbalta [duloxetine Hcl] Other (See Comments)  ? Felt groggy  ? Prednisone Rash  ? She has seen the podiatrist and he had injected cortisone in her feet and she had also taken a peel at home. She had a severe reaction to her face with a rash and had to take Benadryl to resolve the symptom. She actually did get more cortisone shots, but no additional reactions to the shots.  ? Vioxx [rofecoxib] Other (See Comments)  ? Unknown  ? Zelnorm [tegaserod] Other (See Comments)  ? Patient is unaware of allergy  ? Zocor [simvastatin] Other (See Comments)  ? Patient is unaware of allergy  ? ?  ? ?  ?Medication List  ?  ? ?STOP taking these medications   ? ?B complex-vitamin C-folic acid 1 MG tablet ?  ?levofloxacin 500 MG tablet ?Commonly known as: LEVAQUIN ?  ? ?  ? ?TAKE these medications   ? ?acetaminophen 500 MG tablet ?Commonly known as: TYLENOL ?Take 500 mg by mouth every 8  (eight) hours as needed for mild pain, moderate pain or headache. ?  ?albuterol 108 (90 Base) MCG/ACT inhaler ?Commonly known as: VENTOLIN HFA ?INHALE 2 PUFFS EVERY 6 HOURS AS NEEDED FOR WHEEZING OR SHORTNESS OF BREATH ?  ?ALPRAZolam 0.5 MG tablet ?Commonly known as: Prudy Feeler ?Take 1 tablet (0.5 mg total) by mouth 2 (two) times daily. ?  ?benzonatate 200 MG capsule ?Commonly known as: TESSALON ?Take 1 capsule (200 mg total) by mouth 3 (three) times daily as needed for cough. ?  ?bisoprolol 5 MG tablet ?Commonly known as: ZEBETA ?Take 1 tablet (5 mg total) by mouth daily. ?  ?calcium carbonate 500 MG chewable tablet ?Commonly known as: TUMS - dosed in mg elemental calcium ?Chew 2 tablets (400 mg of elemental calcium total) by mouth daily. ?  ?cholecalciferol 1000 units tablet ?Commonly known as: VITAMIN D ?Take 2,000 Units by mouth daily. ?  ?cyanocobalamin 1000 MCG tablet ?Take 1,000 mcg by mouth daily. ?  ?ferrous sulfate 325 (65 FE) MG tablet ?Take 650 mg by mouth daily with breakfast. ?  ?folic acid 1 MG tablet ?Commonly known as: FOLVITE ?Take 1 tablet (1 mg total) by mouth daily. ?Start taking on:  February 13, 2022 ?  ?furosemide 40 MG tablet ?Commonly known as: LASIX ?Take 1 tablet (40 mg total) by mouth daily. ?Start taking on: February 13, 2022 ?  ?losartan 25 MG tablet ?Commonly known as: Cozaar ?Take 1 tablet (25 mg total) by mouth daily. ?  ?omeprazole 40 MG capsule ?Commonly known as: PRILOSEC ?Take 1 capsule (40 mg total) by mouth in the morning and at bedtime. ?  ?traZODone 150 MG tablet ?Commonly known as: DESYREL ?Use from 1/3 to 1 tablet nightly as needed for sleep. ?  ? ?  ? ?  ?  ? ? ?  ?Durable Medical Equipment  ?(From admission, onward)  ?  ? ? ?  ? ?  Start     Ordered  ? 02/12/22 1056  For home use only DME oxygen  Once       ?Question Answer Comment  ?Length of Need 6 Months   ?Mode or (Route) Nasal cannula   ?Liters per Minute 3   ?Frequency Continuous (stationary and portable oxygen unit needed)    ?Oxygen conserving device Yes   ?Oxygen delivery system Gas   ?  ? 02/12/22 1055  ? ?  ?  ? ?  ? ? ?Discharge Exam: ?Filed Weights  ? 02/09/22 0528 02/10/22 0543 02/12/22 0500  ?Weight: 61.8 kg 60.4 kg 56

## 2022-02-12 NOTE — Progress Notes (Signed)
? ?Progress Note ? ?Patient Name: Misty [name] ?Date of Encounter: 02/12/2022 ? ?[facility] Cardiologist: Minus Breeding, MD  ? ?Subjective  ? ?No chest pain, decrease shortness of breath.  PT is recommending skilled nursing facility, patient lives with her spouse and she wants to return home.  Currently wearing mittens.  She is impressed by her lack of swelling in her legs. ? ?Inpatient Medications  ?  ?Scheduled Meds: ? bisoprolol  5 mg Oral Daily  ? budesonide (PULMICORT) nebulizer solution  0.5 mg Nebulization BID  ? enoxaparin (LOVENOX) injection  40 mg Subcutaneous B20F  ? folic acid  1 mg Oral Daily  ? furosemide  40 mg Intravenous BID  ? ipratropium  0.5 mg Nebulization BID  ? levalbuterol  0.63 mg Nebulization BID  ? losartan  25 mg Oral Daily  ? nicotine  14 mg Transdermal Daily  ? ?Continuous Infusions: ? ?PRN Meds: ?acetaminophen **OR** acetaminophen, ALPRAZolam, alum & mag hydroxide-simeth, ondansetron **OR** ondansetron (ZOFRAN) IV, polyethylene glycol, traZODone  ? ?Vital Signs  ?  ?Vitals:  ? 02/11/22 2006 02/11/22 2254 02/12/22 0500 02/12/22 0556  ?BP:  109/79  (!) 143/99  ?Pulse: 85 86  (!) 102  ?Resp:  20  20  ?Temp:  98.2 ?F (36.8 ?C)  98.4 ?F (36.9 ?C)  ?TempSrc:  Oral  Oral  ?SpO2: 91% 92%  93%  ?Weight:   56.2 kg   ?Height:      ? ? ?Intake/Output Summary (Last 24 hours) at 02/12/2022 0912 ?Last data filed at 02/12/2022 0454 ?Gross per 24 hour  ?Intake 240 ml  ?Output --  ?Net 240 ml  ? ? ?  02/12/2022  ?  5:00 AM 02/10/2022  ?  5:43 AM 02/09/2022  ?  5:28 AM  ?Last 3 Weights  ?Weight (lbs) 123 lb 14.4 oz 133 lb 2.5 oz 136 lb 3.9 oz  ?Weight (kg) 56.2 kg 60.4 kg 61.8 kg  ?   ? ?Telemetry  ?  ?Atrial fibrillation heart rate in the 80s to low 100s- Personally Reviewed ? ?ECG  ?  ?Original EKG here on 02/07/2022 likely 2-1 atrial flutter- Personally Reviewed ? ?Physical Exam  ? ?GEN: No acute distress.  Elderly ?Neck: No JVD ?Cardiac: Irregularly irregular normal rate, no murmurs, rubs, or  gallops.  ?Respiratory: Clear to auscultation bilaterally. ?GI: Soft, nontender, non-distended  ?MS: No edema; No deformity. ?Neuro:  Nonfocal  ?Psych: Currently in mittens ? ?Labs  ?  ?High Sensitivity Troponin:   ?Recent Labs  ?Lab 02/07/22 ?1549 02/07/22 ?1804  ?TROPONINIHS 152* 171*  ?   ?Chemistry ?Recent Labs  ?Lab 02/07/22 ?1549 02/08/22 ?0433 02/10/22 ?0413 02/11/22 ?0071 02/12/22 ?2197  ?NA 141   < > 142 140 140  ?K 3.9   < > 3.4* 3.8 3.6  ?CL 104   < > 96* 95* 94*  ?CO2 27   < > 38* 36* 38*  ?GLUCOSE 137*   < > 108* 127* 126*  ?BUN 24*   < > 21 21 25*  ?CREATININE 1.02*   < > 1.10* 0.98 1.00  ?CALCIUM 8.5*   < > 7.9* 8.3* 8.5*  ?MG  --    < > 2.0 1.9 2.1  ?PROT 5.8*  --   --   --   --   ?ALBUMIN 3.1*  --   --   --   --   ?AST 26  --   --   --   --   ?ALT 22  --   --   --   --   ?  ALKPHOS 81  --   --   --   --   ?BILITOT 0.5  --   --   --   --   ?GFRNONAA 56*   < > 51* 58* 57*  ?ANIONGAP 10   < > 8 9 8   ? < > = values in this interval not displayed.  ?  ?Lipids No results for input(s): CHOL, TRIG, HDL, LABVLDL, LDLCALC, CHOLHDL in the last 168 hours.  ?Hematology ?Recent Labs  ?Lab 02/07/22 ?1549 02/08/22 ?0433  ?WBC 7.0 7.1  ?RBC 3.45* 3.53*  ?HGB 8.4* 8.5*  ?HCT 31.2* 31.9*  ?MCV 90.4 90.4  ?MCH 24.3* 24.1*  ?MCHC 26.9* 26.6*  ?RDW 22.5* 22.5*  ?PLT 187 160  ? ?Thyroid No results for input(s): TSH, FREET4 in the last 168 hours.  ?BNP ?Recent Labs  ?Lab 02/07/22 ?1549  ?BNP 2,535.0*  ?  ?DDimer No results for input(s): DDIMER in the last 168 hours.  ? ?Radiology  ?  ?No results found. ? ?Cardiac Studies  ? ? ? 1. Left ventricular ejection fraction, by estimation, is 35 to 40%. The  ?left ventricle has moderately decreased function. The left ventricle  ?demonstrates global hypokinesis. There is moderate left ventricular  ?hypertrophy. Left ventricular diastolic  ?parameters are indeterminate.  ? 2. Right ventricular systolic function is normal. The right ventricular  ?size is mildly enlarged. There is  severely elevated pulmonary artery  ?systolic pressure. The estimated right ventricular systolic pressure is  ?15.4 mmHg.  ? 3. Left atrial size was severely dilated.  ? 4. Right atrial size was severely dilated.  ? 5. The mitral valve is abnormal. Moderate mitral valve regurgitation.  ? 6. Tricuspid valve regurgitation is mild to moderate.  ? 7. The aortic valve is tricuspid. Aortic valve regurgitation is trivial.  ?Aortic valve sclerosis is present, with no evidence of aortic valve  ?stenosis.  ? 8. The inferior vena cava is dilated in size with <50% respiratory  ?variability, suggesting right atrial pressure of 15 mmHg.  ? ?  ?R/LHC: 03/2019 ?1. Mild nonobstructive CAD, calcified coronary arteries ?2. Normal right and left heart filling pressures ?  ?Patient Profile  ?   ?81 y.o. female with acute on chronic systolic heart failure, secondary pulmonary hypertension, paroxysmal atrial fibrillation, coronary artery disease with elevated troponin, anemia, tobacco use admitted with shortness of breath. ? ?Assessment & Plan  ?  ?Acute on chronic systolic heart failure ?- Worsening dyspnea over the past 2-3 weeks. ?-Has been on bisoprolol 5 mg a day as well as losartan 25 mg a day.  In the emergency room received IV Lasix 20 mg x 2 and then was started on IV Lasix 40 mg twice daily. ?- Baseline weight 123 pounds from October 2022 office visit.  Admit weight 147, current weight 123. 6.2 L out total ?-  Renal function has remained stable. ?-Lets go ahead and place her on p.o. Lasix 40 mg daily.  Continue to monitor basic metabolic profile as outpatient. ?- EF stable on repeat echo this admission 35 to 40%.  Unfortunately she does not have blood pressure room to utilize Entresto or SGLT2 inhibitor or spironolactone.  We will continue with low-dose bisoprolol as well as losartan. ? ?Paroxysmal atrial fibrillation ?- Unfortunately has not been able to tolerate anticoagulation secondary to her ongoing chronic anemia.  She has  not been interested in the past and Watchman device.  Previously discussed.  Continues with normal sinus rhythm. ?- CHA2DS2-VASc score is 6 (  heart failure hypertension vascular disease female age greater than 89). ? ?Coronary artery disease ?- Cardiac catheterization from 2020 personally reviewed, mild nonobstructive coronary artery disease with calcified coronary arteries.  Interestingly she had normal right and left heart filling pressures at that time.  Currently echocardiogram demonstrates secondary pulmonary hypertension. ?-She is allergic to aspirin has been intolerant to statins because of myalgia.  Consider readdressing as outpatient. ? ?Elevated troponin ?- Secondary to demand ischemia in the setting of heart failure with troponin of 1 52-1 71. ? ?Chronic anemia ?- Hemoglobin was 8.5 earlier.  Close to her normal baseline.  No active bleeding. ? ?Tobacco use ?- Continues smoke about 1 pack/day.  Cessation advised. ? ?Delirium ?- Xanax utilized, primary team.  The quicker we can get her to her home environment the better. ? ? ?Failure to thrive/social determinants of health ?- Felt very weak in her legs husband would follow behind because she was worried she would fall.  Orthopnea.  Physical therapy recommends SNF however she would like to be home with her husband with dementia. ?-Husband with dementia hard of hearing difficulty at home. ? ?Comfortable with discharge from cardiology perspective.  No further changes.  We will go ahead and sign off.  Has follow-up in clinic. ? ?For questions or updates, please contact [place] ?Please consult [email or web address] for contact info under  ? ?  ?   ?Signed, ?Candee Furbish, MD  ?02/12/2022, 9:12 AM   ? ?

## 2022-02-12 NOTE — Progress Notes (Signed)
SATURATION QUALIFICATIONS: (This note is used to comply with regulatory documentation for home oxygen) ?  ?Patient Saturations on Room Air at Rest = 84 ?  ?Patient Saturations on Room Air while Ambulating = N/A ?  ?Patient Saturations on 3 Liters of oxygen while Ambulating = 94% ?  ?Please briefly explain why patient needs home oxygen: To maintain 02 sat at 90% or above during ambulation. ? ?Orson Eva, DO ?

## 2022-02-13 ENCOUNTER — Telehealth: Payer: Self-pay

## 2022-02-13 DIAGNOSIS — I248 Other forms of acute ischemic heart disease: Secondary | ICD-10-CM | POA: Diagnosis not present

## 2022-02-13 DIAGNOSIS — E785 Hyperlipidemia, unspecified: Secondary | ICD-10-CM | POA: Diagnosis not present

## 2022-02-13 DIAGNOSIS — R410 Disorientation, unspecified: Secondary | ICD-10-CM | POA: Diagnosis not present

## 2022-02-13 DIAGNOSIS — J9601 Acute respiratory failure with hypoxia: Secondary | ICD-10-CM | POA: Diagnosis not present

## 2022-02-13 DIAGNOSIS — D649 Anemia, unspecified: Secondary | ICD-10-CM | POA: Diagnosis not present

## 2022-02-13 DIAGNOSIS — I5021 Acute systolic (congestive) heart failure: Secondary | ICD-10-CM | POA: Diagnosis not present

## 2022-02-13 DIAGNOSIS — I480 Paroxysmal atrial fibrillation: Secondary | ICD-10-CM | POA: Diagnosis not present

## 2022-02-13 DIAGNOSIS — Z9981 Dependence on supplemental oxygen: Secondary | ICD-10-CM | POA: Diagnosis not present

## 2022-02-13 DIAGNOSIS — I2510 Atherosclerotic heart disease of native coronary artery without angina pectoris: Secondary | ICD-10-CM | POA: Diagnosis not present

## 2022-02-13 DIAGNOSIS — J449 Chronic obstructive pulmonary disease, unspecified: Secondary | ICD-10-CM | POA: Diagnosis not present

## 2022-02-13 DIAGNOSIS — J8410 Pulmonary fibrosis, unspecified: Secondary | ICD-10-CM | POA: Diagnosis not present

## 2022-02-13 DIAGNOSIS — F17200 Nicotine dependence, unspecified, uncomplicated: Secondary | ICD-10-CM | POA: Diagnosis not present

## 2022-02-13 DIAGNOSIS — M6281 Muscle weakness (generalized): Secondary | ICD-10-CM | POA: Diagnosis not present

## 2022-02-13 DIAGNOSIS — F418 Other specified anxiety disorders: Secondary | ICD-10-CM | POA: Diagnosis not present

## 2022-02-13 NOTE — Telephone Encounter (Signed)
Transition Care Management Follow-up Telephone Call ?Date of discharge and from where: [facility] 02-12-22 Dx: acute on chronic CHF  ?How have you been since you were released from the hospital? Doing ok just weak  ?Any questions or concerns? No ? ?Items Reviewed: ?Did the pt receive and understand the discharge instructions provided? Yes  ?Medications obtained and verified? Yes  ?Other? No  ?Any new allergies since your discharge? No  ?Dietary orders reviewed? Yes ?Do you have support at home? Yes  ? ?Home Care and Equipment/Supplies: ?Were home health services ordered? yes ?If so, what is the name of the agency[facility]  ?Has the agency set up a time to come to the patient's home? yes ?Were any new equipment or medical supplies ordered?  Yes: oxygen ?What is the name of the medical supply agency? [facility] ?Were you able to get the supplies/equipment? yes ?Do you have any questions related to the use of the equipment or supplies? No ? ?Functional Questionnaire: (I = Independent and D = Dependent) ?ADLs: D ? ?Bathing/Dressing- D ? ?Meal Prep- D ? ?Eating- I ? ?Maintaining continence- I ? ?Transferring/Ambulation- D ? ?Managing Meds- D ? ?Follow up appointments reviewed: ? ?PCP Hospital f/u appt confirmed? Yes  Scheduled to see Dr Livia Snellen on 02-26-22 @ 255pm. ?[facility] f/u appt confirmed? No  . ?Are transportation arrangements needed? No  ?If their condition worsens, is the pt aware to call PCP or go to the Emergency Dept.? Yes ?Was the patient provided with contact information for the PCP's office or ED? Yes ?Was to pt encouraged to call back with questions or concerns? Yes  ?

## 2022-02-19 ENCOUNTER — Ambulatory Visit (INDEPENDENT_AMBULATORY_CARE_PROVIDER_SITE_OTHER): Payer: Medicare Other

## 2022-02-19 DIAGNOSIS — J8410 Pulmonary fibrosis, unspecified: Secondary | ICD-10-CM | POA: Diagnosis not present

## 2022-02-19 DIAGNOSIS — F418 Other specified anxiety disorders: Secondary | ICD-10-CM | POA: Diagnosis not present

## 2022-02-19 DIAGNOSIS — I480 Paroxysmal atrial fibrillation: Secondary | ICD-10-CM | POA: Diagnosis not present

## 2022-02-19 DIAGNOSIS — R410 Disorientation, unspecified: Secondary | ICD-10-CM

## 2022-02-19 DIAGNOSIS — M6281 Muscle weakness (generalized): Secondary | ICD-10-CM

## 2022-02-19 DIAGNOSIS — I248 Other forms of acute ischemic heart disease: Secondary | ICD-10-CM

## 2022-02-19 DIAGNOSIS — E785 Hyperlipidemia, unspecified: Secondary | ICD-10-CM

## 2022-02-19 DIAGNOSIS — F17200 Nicotine dependence, unspecified, uncomplicated: Secondary | ICD-10-CM | POA: Diagnosis not present

## 2022-02-19 DIAGNOSIS — I2510 Atherosclerotic heart disease of native coronary artery without angina pectoris: Secondary | ICD-10-CM

## 2022-02-19 DIAGNOSIS — J9601 Acute respiratory failure with hypoxia: Secondary | ICD-10-CM

## 2022-02-19 DIAGNOSIS — D649 Anemia, unspecified: Secondary | ICD-10-CM | POA: Diagnosis not present

## 2022-02-19 DIAGNOSIS — Z9981 Dependence on supplemental oxygen: Secondary | ICD-10-CM

## 2022-02-19 DIAGNOSIS — J449 Chronic obstructive pulmonary disease, unspecified: Secondary | ICD-10-CM | POA: Diagnosis not present

## 2022-02-19 DIAGNOSIS — I5021 Acute systolic (congestive) heart failure: Secondary | ICD-10-CM | POA: Diagnosis not present

## 2022-02-20 DIAGNOSIS — I480 Paroxysmal atrial fibrillation: Secondary | ICD-10-CM | POA: Diagnosis not present

## 2022-02-20 DIAGNOSIS — D649 Anemia, unspecified: Secondary | ICD-10-CM | POA: Diagnosis not present

## 2022-02-20 DIAGNOSIS — I5021 Acute systolic (congestive) heart failure: Secondary | ICD-10-CM | POA: Diagnosis not present

## 2022-02-20 DIAGNOSIS — I2510 Atherosclerotic heart disease of native coronary artery without angina pectoris: Secondary | ICD-10-CM | POA: Diagnosis not present

## 2022-02-20 DIAGNOSIS — J9601 Acute respiratory failure with hypoxia: Secondary | ICD-10-CM | POA: Diagnosis not present

## 2022-02-20 DIAGNOSIS — R410 Disorientation, unspecified: Secondary | ICD-10-CM | POA: Diagnosis not present

## 2022-02-21 DIAGNOSIS — D649 Anemia, unspecified: Secondary | ICD-10-CM | POA: Diagnosis not present

## 2022-02-21 DIAGNOSIS — R410 Disorientation, unspecified: Secondary | ICD-10-CM | POA: Diagnosis not present

## 2022-02-21 DIAGNOSIS — I5021 Acute systolic (congestive) heart failure: Secondary | ICD-10-CM | POA: Diagnosis not present

## 2022-02-21 DIAGNOSIS — I480 Paroxysmal atrial fibrillation: Secondary | ICD-10-CM | POA: Diagnosis not present

## 2022-02-21 DIAGNOSIS — I2510 Atherosclerotic heart disease of native coronary artery without angina pectoris: Secondary | ICD-10-CM | POA: Diagnosis not present

## 2022-02-21 DIAGNOSIS — J9601 Acute respiratory failure with hypoxia: Secondary | ICD-10-CM | POA: Diagnosis not present

## 2022-02-22 DIAGNOSIS — D649 Anemia, unspecified: Secondary | ICD-10-CM | POA: Diagnosis not present

## 2022-02-22 DIAGNOSIS — I5021 Acute systolic (congestive) heart failure: Secondary | ICD-10-CM | POA: Diagnosis not present

## 2022-02-22 DIAGNOSIS — I480 Paroxysmal atrial fibrillation: Secondary | ICD-10-CM | POA: Diagnosis not present

## 2022-02-22 DIAGNOSIS — R410 Disorientation, unspecified: Secondary | ICD-10-CM | POA: Diagnosis not present

## 2022-02-22 DIAGNOSIS — I2510 Atherosclerotic heart disease of native coronary artery without angina pectoris: Secondary | ICD-10-CM | POA: Diagnosis not present

## 2022-02-22 DIAGNOSIS — J9601 Acute respiratory failure with hypoxia: Secondary | ICD-10-CM | POA: Diagnosis not present

## 2022-02-26 ENCOUNTER — Encounter: Payer: Self-pay | Admitting: Family Medicine

## 2022-02-26 ENCOUNTER — Ambulatory Visit (INDEPENDENT_AMBULATORY_CARE_PROVIDER_SITE_OTHER): Payer: Medicare Other | Admitting: Family Medicine

## 2022-02-26 ENCOUNTER — Ambulatory Visit (INDEPENDENT_AMBULATORY_CARE_PROVIDER_SITE_OTHER): Payer: Medicare Other

## 2022-02-26 VITALS — BP 123/82 | HR 138 | Temp 98.1°F

## 2022-02-26 DIAGNOSIS — I429 Cardiomyopathy, unspecified: Secondary | ICD-10-CM | POA: Diagnosis not present

## 2022-02-26 DIAGNOSIS — I5041 Acute combined systolic (congestive) and diastolic (congestive) heart failure: Secondary | ICD-10-CM | POA: Diagnosis not present

## 2022-02-26 DIAGNOSIS — E876 Hypokalemia: Secondary | ICD-10-CM

## 2022-02-26 DIAGNOSIS — J90 Pleural effusion, not elsewhere classified: Secondary | ICD-10-CM | POA: Diagnosis not present

## 2022-02-26 DIAGNOSIS — R0600 Dyspnea, unspecified: Secondary | ICD-10-CM | POA: Diagnosis not present

## 2022-02-26 DIAGNOSIS — M19012 Primary osteoarthritis, left shoulder: Secondary | ICD-10-CM | POA: Diagnosis not present

## 2022-02-26 DIAGNOSIS — W19XXXA Unspecified fall, initial encounter: Secondary | ICD-10-CM

## 2022-02-26 DIAGNOSIS — R059 Cough, unspecified: Secondary | ICD-10-CM | POA: Diagnosis not present

## 2022-02-26 DIAGNOSIS — I509 Heart failure, unspecified: Secondary | ICD-10-CM | POA: Diagnosis not present

## 2022-02-26 DIAGNOSIS — M25512 Pain in left shoulder: Secondary | ICD-10-CM

## 2022-02-26 MED ORDER — ALBUTEROL SULFATE HFA 108 (90 BASE) MCG/ACT IN AERS: INHALATION_SPRAY | RESPIRATORY_TRACT | 4 refills | Status: AC

## 2022-02-26 MED ORDER — FUROSEMIDE 40 MG PO TABS: 20.0000 mg | ORAL_TABLET | Freq: Every day | ORAL | 2 refills | Status: AC

## 2022-02-26 NOTE — Progress Notes (Signed)
? ?Subjective:  ?Patient ID: Misty [name], female    DOB: 09/29/1941  Age: 81 y.o. MRN: [id number] ? ?CC: Transitions Of Care ? ? ?HPI ?Misty [name] presents for recheck from hospitalization of 02/07/2022 to 02/12/2022. She still feels weak.  She is having the roll from the den to the bathroom in a wheelchair being pushed by her husband.  She had a fall the night she got home just due to losing her balance.  She hit her left shoulder and has had pain from that ever since.  This limits her ability to push her own wheelchair.  Prior to admission she had noted that her weight had started to climb.  It had climbed to 10 pounds between the time she been in this office on March 15 until 8 days later when she was admitted.  It was already up from her previous baseline several pounds.  During hospitalization she was diuresed with Lasix 40 mg IV twice daily and a weight loss of 8.5 pounds in the first 24 hours.  She was discharged on 3 L of oxygen.  She was noted to have demand ischemia during her stay with elevated troponins. ? ? ?  02/26/2022  ?  3:11 PM 01/30/2022  ? 11:20 AM 11/05/2021  ?  4:07 PM  ?Depression screen PHQ 2/9  ?Decreased Interest 1 0 0  ?Down, Depressed, Hopeless 1 0 0  ?PHQ - 2 Score 2 0 0  ?Altered sleeping 2  0  ?Tired, decreased energy 2  1  ?Change in appetite 1  0  ?Feeling bad or failure about yourself  1  0  ?Trouble concentrating 0  0  ?Moving slowly or fidgety/restless 0  0  ?Suicidal thoughts 0  0  ?PHQ-9 Score 8  1  ?Difficult doing work/chores Somewhat difficult  Not difficult at all  ? ? ?History ?Hye has a past medical history of Anxiety, Arthritis, Atrial fibrillation with RVR (HCC) (03/25/2019), Cataract, Chronic back pain, Colitis, Constipation, COPD (chronic obstructive pulmonary disease) (HCC), Depression, Diverticulosis, Educated about COVID-19 virus infection (04/07/2019), Elevated troponin (03/25/2019), GERD (gastroesophageal reflux disease), H/O kyphoplasty (01/14/2019),  Hematochezia (02/26/2015), Hemorrhoids, History of bronchitis, History of migraine, History of shingles, Hyperlipidemia, Hypokalemia (01/10/2019), Insomnia, Migraines, Nontraumatic compression fracture of T11 vertebra (HCC) (07/08/2014), Osteoporosis, and PONV (postoperative nausea and vomiting).  ? ?She has a past surgical history that includes Abdominal hysterectomy; Esophagogastroduodenoscopy; Colonoscopy; Lumbar laminectomy/decompression microdiscectomy (Bilateral, 05/20/2013); Eye surgery (Right, 01/2014); Kyphoplasty (N/A, 07/08/2014); Colonoscopy (N/A, 02/17/2015); Esophagogastroduodenoscopy (N/A, 01/29/2016); Esophagogastroduodenoscopy (N/A, 10/14/2017); Spine surgery; Intramedullary (im) nail intertrochanteric (Left, 01/07/2019); RIGHT/LEFT HEART CATH AND CORONARY ANGIOGRAPHY (N/A, 03/31/2019); left hip surgery; Colonoscopy (N/A, 07/12/2019); Esophagogastroduodenoscopy (N/A, 07/12/2019); biopsy (07/12/2019); Givens capsule study (N/A, 07/29/2019); Givens capsule study (N/A, 08/20/2019); Esophagogastroduodenoscopy (N/A, 10/03/2019); Givens capsule study (10/03/2019); Esophagogastroduodenoscopy (egd) with propofol (N/A, 10/31/2020); biopsy (10/31/2020); and Hot hemostasis (10/31/2020).  ? ?Her family history includes Cirrhosis in her son; Heart attack in her mother; Heart disease in her father and son; Hypertension in her mother; Macular degeneration in her father.She reports that she has been smoking cigarettes. She started smoking about 63 years ago. She has a 52.00 pack-year smoking history. She has never used smokeless tobacco. She reports that she does not drink alcohol and does not use drugs. ? ? ? ?ROS ?Review of Systems  ?Constitutional:  Positive for fatigue.  ?HENT:  Positive for mouth sores (b[place], feels dry).   ?Eyes:  Negative for visual disturbance.  ?Respiratory:  Negative for shortness of   breath.   ?Cardiovascular:  Negative for chest pain.  ?Gastrointestinal:  Negative for abdominal pain.   ?Musculoskeletal:  Negative for arthralgias.  ?Neurological:  Positive for weakness.  ? ?Objective:  ?BP 123/82   Pulse (!) 138   Temp 98.1 ?F (36.7 ?C)   SpO2 (!) 81%  ? ?BP Readings from Last 3 Encounters:  ?02/26/22 123/82  ?02/12/22 (!) 143/99  ?02/07/22 (!) 141/84  ? ? ?Wt Readings from Last 3 Encounters:  ?02/12/22 123 lb 14.4 oz (56.2 kg)  ?02/07/22 142 lb 2 oz (64.5 kg)  ?01/30/22 137 lb 12.8 oz (62.5 kg)  ? ? ? ?Physical Exam ?Constitutional:   ?   General: She is not in acute distress. ?   Appearance: She is well-developed. She is ill-appearing.  ?   Comments: Frail/ cachectic ?  ?HENT:  ?   Head: Normocephalic and atraumatic.  ?   Mouth/Throat:  ?   Pharynx: Posterior oropharyngeal erythema (of tongue) present.  ?Eyes:  ?   Conjunctiva/sclera: Conjunctivae normal.  ?   Pupils: Pupils are equal, round, and reactive to light.  ?Neck:  ?   Thyroid: No thyromegaly.  ?Cardiovascular:  ?   Rate and Rhythm: Normal rate and regular rhythm.  ?   Heart sounds: Normal heart sounds. No murmur heard. ?Pulmonary:  ?   Effort: Pulmonary effort is normal. No respiratory distress.  ?   Breath sounds: Normal breath sounds. No wheezing or rales.  ?Abdominal:  ?   General: Bowel sounds are normal. There is no distension.  ?   Palpations: Abdomen is soft.  ?   Tenderness: There is no abdominal tenderness.  ?Musculoskeletal:     ?   General: Tenderness (over ball of left shoulder for palpation and passive range of motion for all parameters) present. Normal range of motion.  ?   Cervical back: Normal range of motion and neck supple.  ?Lymphadenopathy:  ?   Cervical: No cervical adenopathy.  ?Skin: ?   General: Skin is warm and dry.  ?Neurological:  ?   Mental Status: She is alert and oriented to person, place, and time.  ?Psychiatric:     ?   Behavior: Behavior normal.     ?   Thought Content: Thought content normal.     ?   Judgment: Judgment normal.  ? ? ? ? ?Assessment & Plan:  ? ?Kip was seen today for transitions  of care. ? ?Diagnoses and all orders for this visit: ? ?Acute combined systolic and diastolic congestive heart failure ([place]) ? ?Fall, initial encounter ?-     DG Shoulder Left; Future ? ?Cardiomyopathy, unspecified type ([place]) ?-     DG Chest 2 View; Future ?-     CBC with Differential/Platelet ?-     CMP14+EGFR ? ?Acute pain of left shoulder ?-     DG Shoulder Left; Future ? ?Other orders ?-     furosemide (LASIX) 40 MG tablet; Take 0.5 tablets (20 mg total) by mouth daily. ?-     albuterol (VENTOLIN HFA) 108 (90 Base) MCG/ACT inhaler; INHALE 2 PUFFS EVERY 6 HOURS AS NEEDED FOR WHEEZING OR SHORTNESS OF BREATH ? ? ? ? ? ? ?I have changed [name] "Yvonne"'s furosemide. I am also having her maintain her cholecalciferol, acetaminophen, calcium carbonate, bisoprolol, cyanocobalamin, traZODone, losartan, omeprazole, ALPRAZolam, benzonatate, ferrous sulfate, folic acid, and albuterol. ? ?Allergies as of 02/26/2022   ? ?   Reactions  ? Codeine Nausea And Vomiting  ?  Tramadol Nausea And Vomiting  ? Asa [aspirin] Other (See Comments)  ? Patient is unaware of allergy  ? Azithromycin Other (See Comments)  ? Patient is unaware of allergy  ? Celebrex [celecoxib] Other (See Comments)  ? Irritated stomach  ? Cymbalta [duloxetine Hcl] Other (See Comments)  ? Felt groggy  ? Prednisone Rash  ? She has seen the podiatrist and he had injected cortisone in her feet and she had also taken a peel at home. She had a severe reaction to her face with a rash and had to take Benadryl to resolve the symptom. She actually did get more cortisone shots, but no additional reactions to the shots.  ? Vioxx [rofecoxib] Other (See Comments)  ? Unknown  ? Zelnorm [tegaserod] Other (See Comments)  ? Patient is unaware of allergy  ? Zocor [simvastatin] Other (See Comments)  ? Patient is unaware of allergy  ? ?  ? ?  ?Medication List  ?  ? ?  ? Accurate as of February 26, 2022  9:34 PM. If you have any questions, ask your nurse or doctor.  ?  ?  ? ?   ? ?acetaminophen 500 MG tablet ?Commonly known as: TYLENOL ?Take 500 mg by mouth every 8 (eight) hours as needed for mild pain, moderate pain or headache. ?  ?albuterol 108 (90 Base) MCG/ACT inhaler ?Commonly known as:

## 2022-02-27 ENCOUNTER — Telehealth: Payer: Self-pay | Admitting: Family Medicine

## 2022-02-27 ENCOUNTER — Other Ambulatory Visit: Payer: Self-pay | Admitting: Family Medicine

## 2022-02-27 DIAGNOSIS — D649 Anemia, unspecified: Secondary | ICD-10-CM | POA: Diagnosis not present

## 2022-02-27 DIAGNOSIS — I480 Paroxysmal atrial fibrillation: Secondary | ICD-10-CM | POA: Diagnosis not present

## 2022-02-27 DIAGNOSIS — J9601 Acute respiratory failure with hypoxia: Secondary | ICD-10-CM | POA: Diagnosis not present

## 2022-02-27 DIAGNOSIS — I2510 Atherosclerotic heart disease of native coronary artery without angina pectoris: Secondary | ICD-10-CM | POA: Diagnosis not present

## 2022-02-27 DIAGNOSIS — I5021 Acute systolic (congestive) heart failure: Secondary | ICD-10-CM | POA: Diagnosis not present

## 2022-02-27 DIAGNOSIS — R410 Disorientation, unspecified: Secondary | ICD-10-CM | POA: Diagnosis not present

## 2022-02-27 LAB — CBC WITH DIFFERENTIAL/PLATELET
Basophils Absolute: 0 10*3/uL (ref 0.0–0.2)
Basos: 0 %
EOS (ABSOLUTE): 0 10*3/uL (ref 0.0–0.4)
Eos: 0 %
Hematocrit: 38.2 % (ref 34.0–46.6)
Hemoglobin: 11.3 g/dL (ref 11.1–15.9)
Immature Grans (Abs): 0 10*3/uL (ref 0.0–0.1)
Immature Granulocytes: 1 %
Lymphocytes Absolute: 0.4 10*3/uL — ABNORMAL LOW (ref 0.7–3.1)
Lymphs: 6 %
MCH: 26.3 pg — ABNORMAL LOW (ref 26.6–33.0)
MCHC: 29.6 g/dL — ABNORMAL LOW (ref 31.5–35.7)
MCV: 89 fL (ref 79–97)
Monocytes Absolute: 0.4 10*3/uL (ref 0.1–0.9)
Monocytes: 6 %
Neutrophils Absolute: 6.5 10*3/uL (ref 1.4–7.0)
Neutrophils: 87 %
Platelets: 199 10*3/uL (ref 150–450)
RBC: 4.29 x10E6/uL (ref 3.77–5.28)
RDW: 23.1 % — ABNORMAL HIGH (ref 11.7–15.4)
WBC: 7.4 10*3/uL (ref 3.4–10.8)

## 2022-02-27 LAB — CMP14+EGFR
ALT: 12 IU/L (ref 0–32)
AST: 19 IU/L (ref 0–40)
Albumin/Globulin Ratio: 1.4 (ref 1.2–2.2)
Albumin: 3.6 g/dL — ABNORMAL LOW (ref 3.7–4.7)
Alkaline Phosphatase: 101 IU/L (ref 44–121)
BUN/Creatinine Ratio: 14 (ref 12–28)
BUN: 12 mg/dL (ref 8–27)
Bilirubin Total: 0.5 mg/dL (ref 0.0–1.2)
CO2: 32 mmol/L — ABNORMAL HIGH (ref 20–29)
Calcium: 8.1 mg/dL — ABNORMAL LOW (ref 8.7–10.3)
Chloride: 100 mmol/L (ref 96–106)
Creatinine, Ser: 0.84 mg/dL (ref 0.57–1.00)
Globulin, Total: 2.5 g/dL (ref 1.5–4.5)
Glucose: 111 mg/dL — ABNORMAL HIGH (ref 70–99)
Potassium: 2.8 mmol/L — ABNORMAL LOW (ref 3.5–5.2)
Sodium: 149 mmol/L — ABNORMAL HIGH (ref 134–144)
Total Protein: 6.1 g/dL (ref 6.0–8.5)
eGFR: 70 mL/min/{1.73_m2} (ref >59)

## 2022-02-27 MED ORDER — POTASSIUM CHLORIDE CRYS ER 20 MEQ PO TBCR: 20.0000 meq | EXTENDED_RELEASE_TABLET | Freq: Every day | ORAL | 5 refills | Status: AC

## 2022-02-27 NOTE — Telephone Encounter (Signed)
No answer. I had to leave message. I want her to go to the hospital. Please call to confirm they got the message. ?

## 2022-02-27 NOTE — Telephone Encounter (Signed)
Katie from Home health PT called patient HR at rest it 114-126. Her respirations are 24 and Shallow 02 is 93% Bp is 102/82. Patient feels very wek and has a headache states it is a 5-10. She has not taking any of her medications today because she has a dry mouth and is afraid she will choke. Pt is leaving her and they wants [place] to call her daughter in law with advise her number is [phone]. ?

## 2022-02-27 NOTE — Addendum Note (Signed)
Addended by: Baldomero Lamy B on: 02/27/2022 08:12 AM ? ? Modules accepted: Orders ? ?

## 2022-02-28 ENCOUNTER — Telehealth: Payer: Self-pay | Admitting: Family Medicine

## 2022-02-28 DIAGNOSIS — R069 Unspecified abnormalities of breathing: Secondary | ICD-10-CM | POA: Diagnosis not present

## 2022-02-28 DIAGNOSIS — I480 Paroxysmal atrial fibrillation: Secondary | ICD-10-CM | POA: Diagnosis not present

## 2022-02-28 DIAGNOSIS — I5021 Acute systolic (congestive) heart failure: Secondary | ICD-10-CM | POA: Diagnosis not present

## 2022-02-28 DIAGNOSIS — R000 Tachycardia, unspecified: Secondary | ICD-10-CM | POA: Diagnosis not present

## 2022-02-28 DIAGNOSIS — D649 Anemia, unspecified: Secondary | ICD-10-CM | POA: Diagnosis not present

## 2022-02-28 DIAGNOSIS — R0902 Hypoxemia: Secondary | ICD-10-CM | POA: Diagnosis not present

## 2022-02-28 DIAGNOSIS — R0689 Other abnormalities of breathing: Secondary | ICD-10-CM | POA: Diagnosis not present

## 2022-02-28 DIAGNOSIS — I2510 Atherosclerotic heart disease of native coronary artery without angina pectoris: Secondary | ICD-10-CM | POA: Diagnosis not present

## 2022-02-28 DIAGNOSIS — J9601 Acute respiratory failure with hypoxia: Secondary | ICD-10-CM | POA: Diagnosis not present

## 2022-02-28 DIAGNOSIS — R410 Disorientation, unspecified: Secondary | ICD-10-CM | POA: Diagnosis not present

## 2022-02-28 NOTE — Telephone Encounter (Signed)
Alyse Low called with [facility] stating that she went to visit patient today and reports that patient has been noncompliant with oxygen. Says when using 2.5 ML her oxygen level is about 94% and without it is at 80%. Also says that her heart rate fluctuates a lot. Says it can go from being in the 60s to 150s. Says patient does have Afib. Also says that patient is very anxious which could be reason why her heart rate is so up and down. Says the family mentioned that patient used to take Xanax 0.5, 3x daily but then an adjustment was made so she is taking less now, and patient and family want patient to go back to taking her Xanax 3x daily.  ? ?Alyse Low also noted that EMS was called today for patient due to low oxygen and high heart rate and was recommended to go to the ED but patient refused. Says EMS believed patient had a malfunction with her Oxygen machine, so the company was called about it and they brought patient a new oxygen machine today. ? ?Can contact Christy at  [phone] if needed. ?

## 2022-02-28 NOTE — Telephone Encounter (Signed)
Called daughter in law and no answer. I called patient and she states she called 911 and they came out and evaluated her and said everything looked good. She states she is feeling a lot better this morning and her daughter in law just left for work but she come to check on her this morning. Patient is aware if she starts feeling bad she needs to go to the ER and she verbalized understanding. Patient states she needs her xanax called in because she is really low advised she has to have an appointment for that per office policy. Patient states just please let Dr. Livia Snellen know. Patient is aware Dr. Livia Snellen is off today but will be back Friday. ?

## 2022-03-01 ENCOUNTER — Inpatient Hospital Stay (HOSPITAL_COMMUNITY)
Admission: EM | Admit: 2022-03-01 | Discharge: 2022-03-18 | DRG: 177 | Disposition: E | Payer: Medicare Other | Attending: Family Medicine | Admitting: Family Medicine

## 2022-03-01 ENCOUNTER — Other Ambulatory Visit: Payer: Self-pay

## 2022-03-01 ENCOUNTER — Encounter (HOSPITAL_COMMUNITY): Payer: Self-pay

## 2022-03-01 ENCOUNTER — Emergency Department (HOSPITAL_COMMUNITY): Payer: Medicare Other

## 2022-03-01 DIAGNOSIS — I509 Heart failure, unspecified: Secondary | ICD-10-CM | POA: Diagnosis not present

## 2022-03-01 DIAGNOSIS — M810 Age-related osteoporosis without current pathological fracture: Secondary | ICD-10-CM | POA: Diagnosis present

## 2022-03-01 DIAGNOSIS — Z888 Allergy status to other drugs, medicaments and biological substances status: Secondary | ICD-10-CM

## 2022-03-01 DIAGNOSIS — Z681 Body mass index (BMI) 19 or less, adult: Secondary | ICD-10-CM

## 2022-03-01 DIAGNOSIS — R0602 Shortness of breath: Secondary | ICD-10-CM | POA: Diagnosis not present

## 2022-03-01 DIAGNOSIS — Z881 Allergy status to other antibiotic agents status: Secondary | ICD-10-CM

## 2022-03-01 DIAGNOSIS — I480 Paroxysmal atrial fibrillation: Secondary | ICD-10-CM | POA: Diagnosis present

## 2022-03-01 DIAGNOSIS — I951 Orthostatic hypotension: Secondary | ICD-10-CM | POA: Diagnosis present

## 2022-03-01 DIAGNOSIS — R531 Weakness: Secondary | ICD-10-CM

## 2022-03-01 DIAGNOSIS — E876 Hypokalemia: Secondary | ICD-10-CM | POA: Diagnosis present

## 2022-03-01 DIAGNOSIS — M1990 Unspecified osteoarthritis, unspecified site: Secondary | ICD-10-CM | POA: Diagnosis present

## 2022-03-01 DIAGNOSIS — U071 COVID-19: Secondary | ICD-10-CM | POA: Diagnosis not present

## 2022-03-01 DIAGNOSIS — R64 Cachexia: Secondary | ICD-10-CM | POA: Diagnosis present

## 2022-03-01 DIAGNOSIS — K219 Gastro-esophageal reflux disease without esophagitis: Secondary | ICD-10-CM | POA: Diagnosis not present

## 2022-03-01 DIAGNOSIS — J9611 Chronic respiratory failure with hypoxia: Secondary | ICD-10-CM | POA: Diagnosis present

## 2022-03-01 DIAGNOSIS — J8410 Pulmonary fibrosis, unspecified: Secondary | ICD-10-CM | POA: Diagnosis not present

## 2022-03-01 DIAGNOSIS — Z8249 Family history of ischemic heart disease and other diseases of the circulatory system: Secondary | ICD-10-CM

## 2022-03-01 DIAGNOSIS — I959 Hypotension, unspecified: Secondary | ICD-10-CM | POA: Diagnosis not present

## 2022-03-01 DIAGNOSIS — D509 Iron deficiency anemia, unspecified: Secondary | ICD-10-CM | POA: Diagnosis not present

## 2022-03-01 DIAGNOSIS — J189 Pneumonia, unspecified organism: Secondary | ICD-10-CM

## 2022-03-01 DIAGNOSIS — G8929 Other chronic pain: Secondary | ICD-10-CM | POA: Diagnosis present

## 2022-03-01 DIAGNOSIS — F419 Anxiety disorder, unspecified: Secondary | ICD-10-CM | POA: Diagnosis present

## 2022-03-01 DIAGNOSIS — F32A Depression, unspecified: Secondary | ICD-10-CM | POA: Diagnosis present

## 2022-03-01 DIAGNOSIS — Z743 Need for continuous supervision: Secondary | ICD-10-CM | POA: Diagnosis not present

## 2022-03-01 DIAGNOSIS — F17210 Nicotine dependence, cigarettes, uncomplicated: Secondary | ICD-10-CM | POA: Diagnosis present

## 2022-03-01 DIAGNOSIS — Z886 Allergy status to analgesic agent status: Secondary | ICD-10-CM

## 2022-03-01 DIAGNOSIS — F17200 Nicotine dependence, unspecified, uncomplicated: Secondary | ICD-10-CM | POA: Diagnosis present

## 2022-03-01 DIAGNOSIS — J440 Chronic obstructive pulmonary disease with acute lower respiratory infection: Secondary | ICD-10-CM | POA: Diagnosis present

## 2022-03-01 DIAGNOSIS — I429 Cardiomyopathy, unspecified: Secondary | ICD-10-CM | POA: Diagnosis present

## 2022-03-01 DIAGNOSIS — Z885 Allergy status to narcotic agent status: Secondary | ICD-10-CM

## 2022-03-01 DIAGNOSIS — Z66 Do not resuscitate: Secondary | ICD-10-CM | POA: Diagnosis present

## 2022-03-01 DIAGNOSIS — M549 Dorsalgia, unspecified: Secondary | ICD-10-CM | POA: Diagnosis present

## 2022-03-01 DIAGNOSIS — I4891 Unspecified atrial fibrillation: Secondary | ICD-10-CM | POA: Diagnosis not present

## 2022-03-01 DIAGNOSIS — K922 Gastrointestinal hemorrhage, unspecified: Secondary | ICD-10-CM | POA: Diagnosis present

## 2022-03-01 DIAGNOSIS — Z90710 Acquired absence of both cervix and uterus: Secondary | ICD-10-CM

## 2022-03-01 DIAGNOSIS — J9621 Acute and chronic respiratory failure with hypoxia: Secondary | ICD-10-CM | POA: Diagnosis present

## 2022-03-01 DIAGNOSIS — R54 Age-related physical debility: Secondary | ICD-10-CM | POA: Diagnosis present

## 2022-03-01 DIAGNOSIS — Z79891 Long term (current) use of opiate analgesic: Secondary | ICD-10-CM

## 2022-03-01 DIAGNOSIS — Z716 Tobacco abuse counseling: Secondary | ICD-10-CM

## 2022-03-01 DIAGNOSIS — I499 Cardiac arrhythmia, unspecified: Secondary | ICD-10-CM | POA: Diagnosis not present

## 2022-03-01 DIAGNOSIS — R0689 Other abnormalities of breathing: Secondary | ICD-10-CM | POA: Diagnosis not present

## 2022-03-01 DIAGNOSIS — J1282 Pneumonia due to coronavirus disease 2019: Secondary | ICD-10-CM | POA: Diagnosis present

## 2022-03-01 DIAGNOSIS — F411 Generalized anxiety disorder: Secondary | ICD-10-CM | POA: Diagnosis present

## 2022-03-01 DIAGNOSIS — I5031 Acute diastolic (congestive) heart failure: Secondary | ICD-10-CM | POA: Diagnosis present

## 2022-03-01 DIAGNOSIS — Z9981 Dependence on supplemental oxygen: Secondary | ICD-10-CM

## 2022-03-01 DIAGNOSIS — E782 Mixed hyperlipidemia: Secondary | ICD-10-CM | POA: Diagnosis not present

## 2022-03-01 DIAGNOSIS — Z28310 Unvaccinated for covid-19: Secondary | ICD-10-CM

## 2022-03-01 DIAGNOSIS — J449 Chronic obstructive pulmonary disease, unspecified: Secondary | ICD-10-CM | POA: Diagnosis not present

## 2022-03-01 DIAGNOSIS — R112 Nausea with vomiting, unspecified: Secondary | ICD-10-CM | POA: Diagnosis not present

## 2022-03-01 DIAGNOSIS — D500 Iron deficiency anemia secondary to blood loss (chronic): Secondary | ICD-10-CM | POA: Diagnosis not present

## 2022-03-01 DIAGNOSIS — R069 Unspecified abnormalities of breathing: Secondary | ICD-10-CM | POA: Diagnosis not present

## 2022-03-01 DIAGNOSIS — Z79899 Other long term (current) drug therapy: Secondary | ICD-10-CM

## 2022-03-01 LAB — CBC WITH DIFFERENTIAL/PLATELET
Abs Immature Granulocytes: 0.07 10*3/uL (ref 0.00–0.07)
Basophils Absolute: 0 10*3/uL (ref 0.0–0.1)
Basophils Relative: 0 %
Eosinophils Absolute: 0 10*3/uL (ref 0.0–0.5)
Eosinophils Relative: 0 %
HCT: 38.4 % (ref 36.0–46.0)
Hemoglobin: 11.2 g/dL — ABNORMAL LOW (ref 12.0–15.0)
Immature Granulocytes: 1 %
Lymphocytes Relative: 2 %
Lymphs Abs: 0.2 10*3/uL — ABNORMAL LOW (ref 0.7–4.0)
MCH: 27.3 pg (ref 26.0–34.0)
MCHC: 29.2 g/dL — ABNORMAL LOW (ref 30.0–36.0)
MCV: 93.7 fL (ref 80.0–100.0)
Monocytes Absolute: 0.5 10*3/uL (ref 0.1–1.0)
Monocytes Relative: 5 %
Neutro Abs: 8.5 10*3/uL — ABNORMAL HIGH (ref 1.7–7.7)
Neutrophils Relative %: 92 %
Platelets: 207 10*3/uL (ref 150–400)
RBC: 4.1 MIL/uL (ref 3.87–5.11)
RDW: 26.7 % — ABNORMAL HIGH (ref 11.5–15.5)
WBC: 9.2 10*3/uL (ref 4.0–10.5)
nRBC: 0 % (ref 0.0–0.2)

## 2022-03-01 LAB — TROPONIN I (HIGH SENSITIVITY)
Troponin I (High Sensitivity): 36 ng/L — ABNORMAL HIGH (ref <18)
Troponin I (High Sensitivity): 32 ng/L — ABNORMAL HIGH (ref <18)

## 2022-03-01 LAB — BASIC METABOLIC PANEL
Anion gap: 11 (ref 5–15)
BUN: 22 mg/dL (ref 8–23)
CO2: 35 mmol/L — ABNORMAL HIGH (ref 22–32)
Calcium: 7.9 mg/dL — ABNORMAL LOW (ref 8.9–10.3)
Chloride: 98 mmol/L (ref 98–111)
Creatinine, Ser: 0.92 mg/dL (ref 0.44–1.00)
GFR, Estimated: >60 mL/min (ref >60)
Glucose, Bld: 131 mg/dL — ABNORMAL HIGH (ref 70–99)
Potassium: 3 mmol/L — ABNORMAL LOW (ref 3.5–5.1)
Sodium: 144 mmol/L (ref 135–145)

## 2022-03-01 LAB — PROTIME-INR
INR: 1.3 — ABNORMAL HIGH (ref 0.8–1.2)
Prothrombin Time: 16.3 seconds — ABNORMAL HIGH (ref 11.4–15.2)

## 2022-03-01 LAB — APTT: aPTT: 34 seconds (ref 24–36)

## 2022-03-01 LAB — PROCALCITONIN: Procalcitonin: <0.1 ng/mL

## 2022-03-01 LAB — MAGNESIUM: Magnesium: 1.4 mg/dL — ABNORMAL LOW (ref 1.7–2.4)

## 2022-03-01 LAB — LACTIC ACID, PLASMA
Lactic Acid, Venous: 1.2 mmol/L (ref 0.5–1.9)
Lactic Acid, Venous: 1.2 mmol/L (ref 0.5–1.9)

## 2022-03-01 LAB — RESP PANEL BY RT-PCR (FLU A&B, COVID) ARPGX2
Influenza A by PCR: NEGATIVE
Influenza B by PCR: NEGATIVE
SARS Coronavirus 2 by RT PCR: POSITIVE — AB

## 2022-03-01 LAB — BRAIN NATRIURETIC PEPTIDE: B Natriuretic Peptide: 2519 pg/mL — ABNORMAL HIGH (ref 0.0–100.0)

## 2022-03-01 MED ORDER — VITAMIN B-12 1000 MCG PO TABS
1000.0000 ug | ORAL_TABLET | Freq: Every day | ORAL | Status: DC
  Administered 2022-03-01 – 2022-03-02 (×2): 1000 ug via ORAL
  Filled 2022-03-01 (×2): qty 1

## 2022-03-01 MED ORDER — FERROUS SULFATE 325 (65 FE) MG PO TABS
325.0000 mg | ORAL_TABLET | Freq: Every day | ORAL | Status: DC
  Administered 2022-03-02: 325 mg via ORAL
  Filled 2022-03-01: qty 1

## 2022-03-01 MED ORDER — GUAIFENESIN-DM 100-10 MG/5ML PO SYRP: 10.0000 mL | ORAL_SOLUTION | ORAL | Status: DC | PRN

## 2022-03-01 MED ORDER — DILTIAZEM HCL-DEXTROSE 125-5 MG/125ML-% IV SOLN (PREMIX)
5.0000 mg/h | INTRAVENOUS | Status: DC
  Administered 2022-03-01 – 2022-03-02 (×2): 5 mg/h via INTRAVENOUS
  Filled 2022-03-01 (×2): qty 125

## 2022-03-01 MED ORDER — POTASSIUM CHLORIDE CRYS ER 20 MEQ PO TBCR
20.0000 meq | EXTENDED_RELEASE_TABLET | Freq: Every day | ORAL | Status: DC
  Administered 2022-03-02: 20 meq via ORAL
  Filled 2022-03-01: qty 1

## 2022-03-01 MED ORDER — SODIUM CHLORIDE 0.9 % IV SOLN: 500.0000 mg | Freq: Once | INTRAVENOUS | Status: DC

## 2022-03-01 MED ORDER — ACETAMINOPHEN 325 MG PO TABS
650.0000 mg | ORAL_TABLET | Freq: Four times a day (QID) | ORAL | Status: DC | PRN
  Administered 2022-03-01 – 2022-03-02 (×3): 650 mg via ORAL
  Filled 2022-03-01 (×3): qty 2

## 2022-03-01 MED ORDER — MAGNESIUM SULFATE 2 GM/50ML IV SOLN
2.0000 g | Freq: Once | INTRAVENOUS | Status: AC
  Administered 2022-03-01: 2 g via INTRAVENOUS
  Filled 2022-03-01: qty 50

## 2022-03-01 MED ORDER — IPRATROPIUM-ALBUTEROL 20-100 MCG/ACT IN AERS
1.0000 | INHALATION_SPRAY | Freq: Four times a day (QID) | RESPIRATORY_TRACT | Status: DC
  Administered 2022-03-01 – 2022-03-02 (×3): 1 via RESPIRATORY_TRACT
  Filled 2022-03-01: qty 4

## 2022-03-01 MED ORDER — SODIUM CHLORIDE 0.9 % IV SOLN: 1.0000 g | Freq: Once | INTRAVENOUS | Status: DC

## 2022-03-01 MED ORDER — FOLIC ACID 1 MG PO TABS
1.0000 mg | ORAL_TABLET | Freq: Every day | ORAL | Status: DC
  Administered 2022-03-01 – 2022-03-02 (×2): 1 mg via ORAL
  Filled 2022-03-01 (×2): qty 1

## 2022-03-01 MED ORDER — SODIUM CHLORIDE 0.9 % IV SOLN: INTRAVENOUS | Status: DC

## 2022-03-01 MED ORDER — ONDANSETRON HCL 4 MG/2ML IJ SOLN
4.0000 mg | Freq: Once | INTRAMUSCULAR | Status: AC
  Administered 2022-03-01: 4 mg via INTRAVENOUS
  Filled 2022-03-01: qty 2

## 2022-03-01 MED ORDER — ENOXAPARIN SODIUM 40 MG/0.4ML IJ SOSY
40.0000 mg | PREFILLED_SYRINGE | INTRAMUSCULAR | Status: DC
  Administered 2022-03-01 – 2022-03-02 (×2): 40 mg via SUBCUTANEOUS
  Filled 2022-03-01 (×2): qty 0.4

## 2022-03-01 MED ORDER — VITAMIN D 25 MCG (1000 UNIT) PO TABS
2000.0000 [IU] | ORAL_TABLET | Freq: Every day | ORAL | Status: DC
  Administered 2022-03-01 – 2022-03-02 (×2): 2000 [IU] via ORAL
  Filled 2022-03-01 (×2): qty 2

## 2022-03-01 MED ORDER — TRAZODONE HCL 50 MG PO TABS: 25.0000 mg | ORAL_TABLET | Freq: Every evening | ORAL | Status: DC | PRN

## 2022-03-01 MED ORDER — SODIUM CHLORIDE 0.9 % IV SOLN
2.0000 g | Freq: Once | INTRAVENOUS | Status: AC
Start: 2022-03-01 — End: 2022-03-01
  Administered 2022-03-01: 2 g via INTRAVENOUS
  Filled 2022-03-01: qty 20

## 2022-03-01 MED ORDER — VANCOMYCIN HCL IN DEXTROSE 1-5 GM/200ML-% IV SOLN
1000.0000 mg | Freq: Once | INTRAVENOUS | Status: AC
  Administered 2022-03-01: 1000 mg via INTRAVENOUS
  Filled 2022-03-01: qty 200

## 2022-03-01 MED ORDER — POTASSIUM CHLORIDE CRYS ER 20 MEQ PO TBCR
40.0000 meq | EXTENDED_RELEASE_TABLET | Freq: Once | ORAL | Status: DC
  Filled 2022-03-01: qty 2

## 2022-03-01 MED ORDER — FUROSEMIDE 10 MG/ML IJ SOLN
40.0000 mg | Freq: Once | INTRAMUSCULAR | Status: AC
  Administered 2022-03-01: 40 mg via INTRAVENOUS
  Filled 2022-03-01: qty 4

## 2022-03-01 MED ORDER — FUROSEMIDE 40 MG PO TABS: 20.0000 mg | ORAL_TABLET | Freq: Every day | ORAL | Status: DC

## 2022-03-01 MED ORDER — SODIUM CHLORIDE 0.9 % IV SOLN: 100.0000 mg | Freq: Every day | INTRAVENOUS | Status: DC

## 2022-03-01 MED ORDER — POTASSIUM CHLORIDE CRYS ER 20 MEQ PO TBCR
40.0000 meq | EXTENDED_RELEASE_TABLET | Freq: Once | ORAL | Status: AC
  Administered 2022-03-01: 40 meq via ORAL
  Filled 2022-03-01: qty 2

## 2022-03-01 MED ORDER — ASCORBIC ACID 500 MG PO TABS
500.0000 mg | ORAL_TABLET | Freq: Every day | ORAL | Status: DC
  Administered 2022-03-01 – 2022-03-02 (×2): 500 mg via ORAL
  Filled 2022-03-01 (×2): qty 1

## 2022-03-01 MED ORDER — HYDROCOD POLI-CHLORPHE POLI ER 10-8 MG/5ML PO SUER: 5.0000 mL | Freq: Two times a day (BID) | ORAL | Status: DC | PRN

## 2022-03-01 MED ORDER — SODIUM CHLORIDE 0.9 % IV SOLN
100.0000 mg | Freq: Every day | INTRAVENOUS | Status: DC
  Administered 2022-03-02: 100 mg via INTRAVENOUS
  Filled 2022-03-01 (×2): qty 20

## 2022-03-01 MED ORDER — ONDANSETRON HCL 4 MG/2ML IJ SOLN
4.0000 mg | Freq: Four times a day (QID) | INTRAMUSCULAR | Status: DC | PRN
Start: 2022-03-01 — End: 2022-03-02

## 2022-03-01 MED ORDER — PANTOPRAZOLE SODIUM 40 MG PO TBEC
40.0000 mg | DELAYED_RELEASE_TABLET | Freq: Two times a day (BID) | ORAL | Status: DC
  Administered 2022-03-01 – 2022-03-02 (×3): 40 mg via ORAL
  Filled 2022-03-01 (×3): qty 1

## 2022-03-01 MED ORDER — BISOPROLOL FUMARATE 5 MG PO TABS
5.0000 mg | ORAL_TABLET | Freq: Every day | ORAL | Status: DC
  Administered 2022-03-01 – 2022-03-02 (×2): 5 mg via ORAL
  Filled 2022-03-01 (×2): qty 1

## 2022-03-01 MED ORDER — ZINC SULFATE 220 (50 ZN) MG PO CAPS
220.0000 mg | ORAL_CAPSULE | Freq: Every day | ORAL | Status: DC
  Administered 2022-03-01 – 2022-03-02 (×2): 220 mg via ORAL
  Filled 2022-03-01 (×2): qty 1

## 2022-03-01 MED ORDER — DEXAMETHASONE 4 MG PO TABS
4.0000 mg | ORAL_TABLET | ORAL | Status: DC
  Administered 2022-03-01 – 2022-03-02 (×2): 4 mg via ORAL
  Filled 2022-03-01 (×2): qty 1

## 2022-03-01 MED ORDER — DILTIAZEM LOAD VIA INFUSION
10.0000 mg | Freq: Once | INTRAVENOUS | Status: AC
  Administered 2022-03-01: 10 mg via INTRAVENOUS
  Filled 2022-03-01: qty 10

## 2022-03-01 MED ORDER — ALPRAZOLAM 0.5 MG PO TABS
0.5000 mg | ORAL_TABLET | Freq: Two times a day (BID) | ORAL | Status: DC
  Administered 2022-03-01 – 2022-03-02 (×3): 0.5 mg via ORAL
  Filled 2022-03-01 (×3): qty 1

## 2022-03-01 MED ORDER — BISACODYL 5 MG PO TBEC: 5.0000 mg | DELAYED_RELEASE_TABLET | Freq: Every day | ORAL | Status: DC | PRN

## 2022-03-01 MED ORDER — CALCIUM CARBONATE ANTACID 500 MG PO CHEW
2.0000 | CHEWABLE_TABLET | Freq: Every day | ORAL | Status: DC
  Administered 2022-03-01 – 2022-03-02 (×2): 400 mg via ORAL
  Filled 2022-03-01 (×2): qty 2

## 2022-03-01 MED ORDER — SODIUM CHLORIDE 0.9 % IV SOLN
100.0000 mg | INTRAVENOUS | Status: AC
  Administered 2022-03-01 (×2): 100 mg via INTRAVENOUS
  Filled 2022-03-01 (×2): qty 100

## 2022-03-01 MED ORDER — SODIUM CHLORIDE 0.9 % IV SOLN: 200.0000 mg | Freq: Once | INTRAVENOUS | Status: DC

## 2022-03-01 MED ORDER — HYDROCODONE-ACETAMINOPHEN 5-325 MG PO TABS
1.0000 | ORAL_TABLET | ORAL | Status: DC | PRN
  Administered 2022-03-02: 1 via ORAL
  Filled 2022-03-01: qty 1

## 2022-03-01 MED ORDER — POTASSIUM CHLORIDE 10 MEQ/100ML IV SOLN
10.0000 meq | Freq: Once | INTRAVENOUS | Status: AC
Start: 2022-03-01 — End: 2022-03-01
  Administered 2022-03-01: 10 meq via INTRAVENOUS
  Filled 2022-03-01: qty 100

## 2022-03-01 MED ORDER — VANCOMYCIN HCL 500 MG/100ML IV SOLN
500.0000 mg | Freq: Two times a day (BID) | INTRAVENOUS | Status: DC
  Filled 2022-03-01 (×2): qty 100

## 2022-03-01 MED ORDER — ONDANSETRON HCL 4 MG PO TABS: 4.0000 mg | ORAL_TABLET | Freq: Four times a day (QID) | ORAL | Status: DC | PRN

## 2022-03-01 MED ORDER — HEPARIN (PORCINE) 25000 UT/250ML-% IV SOLN
800.0000 [IU]/h | INTRAVENOUS | Status: DC
  Administered 2022-03-01: 800 [IU]/h via INTRAVENOUS
  Filled 2022-03-01: qty 250

## 2022-03-01 MED ORDER — SODIUM CHLORIDE 0.9 % IV SOLN
2.0000 g | Freq: Once | INTRAVENOUS | Status: AC
  Administered 2022-03-01: 2 g via INTRAVENOUS
  Filled 2022-03-01: qty 12.5

## 2022-03-01 MED ORDER — HEPARIN BOLUS VIA INFUSION
2900.0000 [IU] | Freq: Once | INTRAVENOUS | Status: AC
  Administered 2022-03-01: 2900 [IU] via INTRAVENOUS

## 2022-03-01 NOTE — ED Notes (Signed)
ED Provider at bedside. 

## 2022-03-01 NOTE — Assessment & Plan Note (Signed)
--  resume home treatments ?

## 2022-03-01 NOTE — Assessment & Plan Note (Signed)
--  secondary to acute covid infection  ?--planning for PT eval when medically ready.  ?

## 2022-03-01 NOTE — ED Provider Notes (Signed)
?[facility] ?Provider Note ? ? ?CSN: [id number] ?Arrival date & time: 03/05/2022  8299 ? ?  ? ?History ? ?Chief Complaint  ?Patient presents with  ? Shortness of Breath  ? ? ?Misty [name] is a 81 y.o. female. ? ?The history is provided by the patient.  ?Shortness of Breath ?She has history of hyperlipidemia, COPD, pulmonary fibrosis, heart failure, paroxysmal atrial fibrillation but not on anticoagulation and comes in because of approximately 1 week history of worsening shortness of breath.  There has been minimal cough and no fever or chills.  She has had nausea and vomiting today.  She had EMS come to her house yesterday, but apparently was not sick enough to be transported to the hospital.  Symptoms got worse overnight.  She has noted that her heart has been racing throughout this. ?  ?Home Medications ?Prior to Admission medications   ?Medication Sig Start Date End Date Taking? Authorizing Provider  ?acetaminophen (TYLENOL) 500 MG tablet Take 500 mg by mouth every 8 (eight) hours as needed for mild pain, moderate pain or headache.  01/20/19   [provider]  ?albuterol (VENTOLIN HFA) 108 (90 Base) MCG/ACT inhaler INHALE 2 PUFFS EVERY 6 HOURS AS NEEDED FOR WHEEZING OR SHORTNESS OF BREATH 02/26/22   Claretta Fraise, MD  ?ALPRAZolam Duanne Moron) 0.5 MG tablet Take 1 tablet (0.5 mg total) by mouth 2 (two) times daily. 01/30/22   Claretta Fraise, MD  ?benzonatate (TESSALON) 200 MG capsule Take 1 capsule (200 mg total) by mouth 3 (three) times daily as needed for cough. 01/30/22   Claretta Fraise, MD  ?bisoprolol (ZEBETA) 5 MG tablet Take 1 tablet (5 mg total) by mouth daily. 03/28/21   Minus Breeding, MD  ?calcium carbonate (TUMS - DOSED IN MG ELEMENTAL CALCIUM) 500 MG chewable tablet Chew 2 tablets (400 mg of elemental calcium total) by mouth daily. 11/16/20   Rogene Houston, MD  ?cholecalciferol (VITAMIN D) 1000 UNITS tablet Take 2,000 Units by mouth daily.     [provider]   ?cyanocobalamin 1000 MCG tablet Take 1,000 mcg by mouth daily.    [provider]  ?ferrous sulfate 325 (65 FE) MG tablet Take 650 mg by mouth daily with breakfast.    [provider]  ?folic acid (FOLVITE) 1 MG tablet Take 1 tablet (1 mg total) by mouth daily. 02/13/22   Orson Eva, MD  ?furosemide (LASIX) 40 MG tablet Take 0.5 tablets (20 mg total) by mouth daily. 02/26/22   Claretta Fraise, MD  ?losartan (COZAAR) 25 MG tablet Take 1 tablet (25 mg total) by mouth daily. 08/22/21   Minus Breeding, MD  ?omeprazole (PRILOSEC) 40 MG capsule Take 1 capsule (40 mg total) by mouth in the morning and at bedtime. 11/21/21   Claretta Fraise, MD  ?potassium chloride SA (KLOR-CON M) 20 MEQ tablet Take 1 tablet (20 mEq total) by mouth daily. For potassium replacement/ supplement 02/27/22   Claretta Fraise, MD  ?traZODone (DESYREL) 150 MG tablet Use from 1/3 to 1 tablet nightly as needed for sleep. 08/06/21   Claretta Fraise, MD  ?   ? ?Allergies    ?Codeine, Tramadol, Asa [aspirin], Azithromycin, Celebrex [celecoxib], Cymbalta [duloxetine hcl], Prednisone, Vioxx [rofecoxib], Zelnorm [tegaserod], and Zocor [simvastatin]   ? ?Review of Systems   ?Review of Systems  ?Respiratory:  Positive for shortness of breath.   ?All other systems reviewed and are negative. ? ?Physical Exam ?Updated Vital Signs ?BP (!) 150/89   Pulse (!) 138  Resp (!) 26   Ht 5' 7"  (1.702 m)   Wt 57 kg   SpO2 92%   BMI 19.68 kg/m?  ?Physical Exam ?Vitals and nursing note reviewed.  ?81 year old female, resting comfortably and in no acute distress. Vital signs are significant for elevated heart rate, respiratory rate, blood pressure. Oxygen saturation is 92%, which is normal. ?Head is normocephalic and atraumatic. PERRLA, EOMI. Oropharynx is clear. ?Neck is nontender and supple without adenopathy.  JVD is present at 90 degrees. ?Back is nontender and there is no CVA tenderness. ?Lungs have rales diffusely.  There are no wheezes or rhonchi  appreciated.  There is no prolongation of exhalation phase.Marland Kitchen ?Chest is nontender. ?Heart has regular rate and rhythm without murmur. ?Abdomen is soft, flat, nontender. ?Extremities have no cyanosis or edema, full range of motion is present. ?Skin is warm and dry without rash. ?Neurologic: Mental status is normal, cranial nerves are intact, moves all extremities equally. ? ?ED Results / Procedures / Treatments   ?Labs ?(all labs ordered are listed, but only abnormal results are displayed) ?Labs Reviewed  ?RESP PANEL BY RT-PCR (FLU A&B, COVID) ARPGX2  ?BASIC METABOLIC PANEL  ?BRAIN NATRIURETIC PEPTIDE  ?CBC WITH DIFFERENTIAL/PLATELET  ?MAGNESIUM  ?TROPONIN I (HIGH SENSITIVITY)  ? ? ?EKG ?EKG Interpretation ? ?Date/Time:  Friday March 01 2022 06:45:13 EDT ?Ventricular Rate:  131 ?PR Interval:    ?QRS Duration: 98 ?QT Interval:  383 ?QTC Calculation: 566 ?R Axis:   -75 ?Text Interpretation: Atrial fibrillation with rapid ventricular response Left anterior fascicular block LVH with secondary repolarization abnormality ST depression, probably rate related Prolonged QT interval Artifact in lead(s) V1 When compared with ECG of 02/07/2022, Atrial fibrillation with rapid ventricular response has replaced Sinus tachycardia QT has lengthened Confirmed by Delora Fuel ([id number]) on 02/22/2022 6:51:17 AM ? ?Radiology ?No results found. ? ?Procedures ?Procedures  ?Cardiac monitor shows atrial fibrillation with rapid ventricular response, per my interpretation. ? ?Medications Ordered in ED ?Medications  ?diltiazem (CARDIZEM) 1 mg/mL load via infusion 10 mg (10 mg Intravenous Bolus from Bag 03/16/2022 0659)  ?  And  ?diltiazem (CARDIZEM) 125 mg in dextrose 5% 125 mL (1 mg/mL) infusion (5 mg/hr Intravenous New Bag/Given 03/11/2022 0658)  ?heparin bolus via infusion 2,900 Units (has no administration in time range)  ?heparin ADULT infusion 100 units/mL (25000 units/218m) (has no administration in time range)  ?furosemide (LASIX) injection 40 mg  (has no administration in time range)  ?cefTRIAXone (ROCEPHIN) 2 g in sodium chloride 0.9 % 100 mL IVPB (has no administration in time range)  ?ondansetron ([facility] injection 4 mg (4 mg Intravenous Given 03/06/2022 0656)  ? ? ?ED Course/ Medical Decision Making/ A&P ?  ?                        ?Medical Decision Making ?Amount and/or Complexity of Data Reviewed ?Labs: ordered. ?Radiology: ordered. ? ?Risk ?Prescription drug management. ? ? ?Dyspnea with exam suggestive of heart failure with rales and neck vein distention.  Other possibilities include, but are not limited to, pneumonia, COPD exacerbation, pulmonary fibrosis, pulmonary embolism.  ECG shows atrial fibrillation with some minor ST changes which are probably rate related.  Old records are reviewed, and cardiology note explains that she was taken off of anticoagulants because of GI bleeds.  However, in this acute setting, we will put back on heparin which can be discontinued, if necessary.  Labs are ordered including BNP, chest x-ray is ordered to  look for evidence of pneumonia or heart failure.  Of note, patient is not a candidate for cardioversion since symptoms have been present for more than 3 days and she is not anticoagulated. ? ?Chest x-ray per my reading, shows changes of pulmonary fibrosis with density in the right mid and lower lung field consistent either with exacerbation of heart failure or pneumonia.  Radiologist interpretation is pending.  She will empirically be treated for both.  She is given a dose of furosemide and started on ceftriaxone for possible community-acquired pneumonia.  Case is signed out to Dr. Rogene Houston. ? ?CRITICAL CARE ?Performed by: Delora Fuel ?Total critical care time: 35 minutes ?Critical care time was exclusive of separately billable procedures and treating other patients. ?Critical care was necessary to treat or prevent imminent or life-threatening deterioration. ?Critical care was time spent personally by me on the  following activities: development of treatment plan with patient and/or surrogate as well as nursing, discussions with consultants, evaluation of patient's response to treatment, examination of patient, obtaining history f

## 2022-03-01 NOTE — Progress Notes (Signed)
Spo2 100% on 7lpm [place]ter HFNC o2 decreased to 5lpm  ?

## 2022-03-01 NOTE — ED Provider Notes (Signed)
I provided a substantive portion of the care of this patient.  I personally performed the entirety of the history, exam, and medical decision making for this encounter. ? ?EKG Interpretation ? ?Date/Time:  Friday March 01 2022 06:45:13 EDT ?Ventricular Rate:  131 ?PR Interval:    ?QRS Duration: 98 ?QT Interval:  383 ?QTC Calculation: 566 ?R Axis:   -75 ?Text Interpretation: Atrial fibrillation with rapid ventricular response Left anterior fascicular block LVH with secondary repolarization abnormality ST depression, probably rate related Prolonged QT interval Artifact in lead(s) V1 When compared with ECG of 02/07/2022, Atrial fibrillation with rapid ventricular response has replaced Sinus tachycardia QT has lengthened Confirmed by Delora Fuel ([id number]) on 03/04/2022 6:51:17 AM  ? ?CRITICAL CARE ?Performed by: Fredia Sorrow ?Total critical care time: 45 minutes ?Critical care time was exclusive of separately billable procedures and treating other patients. ?Critical care was necessary to treat or prevent imminent or life-threatening deterioration. ?Critical care was time spent personally by me on the following activities: development of treatment plan with patient and/or surrogate as well as nursing, discussions with consultants, evaluation of patient's response to treatment, examination of patient, obtaining history from patient or surrogate, ordering and performing treatments and interventions, ordering and review of laboratory studies, ordering and review of radiographic studies, pulse oximetry and re-evaluation of patient's condition.  ? ?Patient troponins are steady state.  Patient's BNP is elevated like when she was admitted for congestive heart failure.  Patient with rapid atrial fibs.  Started on diltiazem drip that is being titrated up.  Patient's heart rate is improving but she still over 100.  Patient is requiring 4 L of oxygen normally she is on 3 L of oxygen.  Chest x-ray is consistent with pneumonia.   Multifocal in the right lower lobes area.  COVID testing still pending.  Patient not really meeting sepsis criteria lactic acid was 1.2.  No leukocytosis.  Magnesium was low at 1.4.  Patient given IV magnesium patient's potassium a little low at 3.0 given some IV potassium as patient not able to swallow potassium pills.  Her renal function is normal with a GFR greater than 60. ? ?Patient will be admitted by the hospitalist service.  Most likely patient with an exacerbation of CHF rapid atrial fibs not anticoagulated Dr. Roxanne Mins started her on heparin patient has chronic atrial fib.  And also patient with multifocal right lower lobe pneumonia that could be how acquired pneumonia.  Patient originally received Rocephin but I expanded that out for HCAP antibiotics. ? ?Patient not hypotensive.  Patient is tachycardic irregular and patient is tachypneic. ?  ?Fredia Sorrow, MD ?03/08/2022 1033 ? ?

## 2022-03-01 NOTE — Assessment & Plan Note (Signed)
--  pt reports being on 2L/min oxygen but recently increased to 3-4L/min due to dyspnea symptoms.  Continue supplement oxygenation.  ?

## 2022-03-01 NOTE — Progress Notes (Signed)
Patient came in on 3L [place], taken off to get baseline; 86% on RA.  Patient has a history of COPD and CHF.  Patient stated that MD told her 90% was her baseline on sats.  Patient is an everyday smoker and is currently on 4L [place] with sat of 91%.  BS are diminished.  Will give report on this patient to oncoming RT. ?

## 2022-03-01 NOTE — Assessment & Plan Note (Signed)
--   Patient considered high risk due to unvaccinated status, pulmonary fibrosis chronic respiratory failure and advanced age.  IV remdesivir ordered. ?-- Continue supplemental oxygen as ordered ?-- Steroids ordered ?-- Bronchodilators ordered ?-- Monitor and trend inflammatory markers ?-- Check procalcitonin.  Continue antibiotics until we have results of procalcitonin testing ?-- Continue supportive care as ordered ?

## 2022-03-01 NOTE — Assessment & Plan Note (Signed)
--   resume home oral iron supplement  ?

## 2022-03-01 NOTE — Assessment & Plan Note (Signed)
--  resume home treatments.  ?

## 2022-03-01 NOTE — H&P (Signed)
?History and Physical  ?[place] ? ?Misty [name] MVE:[id number] DOB: Sep 04, 1941 DOA: 02/22/2022 ? ?PCP: Claretta Fraise, MD  ?Patient coming from: [facility] ?Level of care: Stepdown ? ?I have personally briefly reviewed patient's old medical records in [place] ? ?Chief Complaint: SOB ? ?HPI: Misty [name] is a 81 year old female with chronic hypoxic respiratory failure recently started on continuous home oxygen, COPD, pulmonary fibrosis, heart failure, paroxysmal atrial fibrillation (not anticoagulated due to history of GI bleeding) reports that she has been on vaccinated for COVID-19 because she normally does not leave the house very much.  She was recently seen by her primary care provider during an outpatient visit reports 1 week of worsening shortness of breath.  She denies fever and chills.  She has had cough and chest congestion.  She reports that her symptoms have been worse in the past 24 hours and she reports that she has had heart palpitations.  She denies chest pain symptoms.  She reports she has generalized malaise weakness fatigue and generally feels unwell. ? ?She was evaluated in the emergency room and found to have a multifocal pneumonia.  Her BNP was elevated at 2500 but stable from recent hospitalization.  She was noted to be in atrial fibrillation with RVR and started on an IV Cardizem infusion and IV heparin infusion.  Patient tested positive for SARS 2 coronavirus.  She was started on empiric antibiotic therapy and hospitalization was requested. ? ?Review of Systems: Review of Systems  ?Constitutional:  Positive for malaise/fatigue and weight loss. Negative for chills, diaphoresis and fever.  ?HENT:  Positive for congestion. Negative for ear discharge, ear pain and tinnitus.   ?Eyes: Negative.   ?Respiratory:  Positive for cough, sputum production, shortness of breath and wheezing.   ?Cardiovascular: Negative.   ?Gastrointestinal:  Positive for heartburn and nausea. Negative  for abdominal pain and vomiting.  ?Genitourinary: Negative.   ?Musculoskeletal:  Positive for joint pain and myalgias. Negative for falls.  ?Skin: Negative.   ?Neurological:  Positive for weakness and headaches. Negative for dizziness, tremors, sensory change, focal weakness and loss of consciousness.  ?Endo/Heme/Allergies: Negative.   ?Psychiatric/Behavioral:  Positive for depression and memory loss. The patient is nervous/anxious.   ?All other systems reviewed and are negative. ?  ?Past Medical History:  ?Diagnosis Date  ? Anxiety   ? takes Xanax daily  ? Arthritis   ? back  ? Atrial fibrillation with RVR ([place]) 03/25/2019  ? Cataract   ? Chronic back pain   ? Colitis   ? Constipation   ? OTC stool softener prn  ? COPD (chronic obstructive pulmonary disease) ([place])   ? Depression   ? Diverticulosis   ? Educated about COVID-19 virus infection 04/07/2019  ? Elevated troponin 03/25/2019  ? GERD (gastroesophageal reflux disease)   ? takes Ranidine daily  ? H/O kyphoplasty 01/14/2019  ? Hematochezia 02/26/2015  ? Hemorrhoids   ? History of bronchitis   ? History of migraine   ? many yrs ago  ? History of shingles   ? Hyperlipidemia   ? Hypokalemia 01/10/2019  ? Insomnia   ? Migraines   ? Nontraumatic compression fracture of T11 vertebra ([facility]) 07/08/2014  ? Osteoporosis   ? PONV (postoperative nausea and vomiting)   ? ? ?Past Surgical History:  ?Procedure Laterality Date  ? ABDOMINAL HYSTERECTOMY    ? BIOPSY  07/12/2019  ? Procedure: BIOPSY;  Surgeon: Rogene Houston, MD;  [facility];  Service:  Endoscopy;;  duodenum  ? BIOPSY  10/31/2020  ? Procedure: BIOPSY;  Surgeon: Harvel Quale, MD;  [facility];  Service: Gastroenterology;;  ? COLONOSCOPY    ? COLONOSCOPY N/A 02/17/2015  ? Procedure: COLONOSCOPY;  Surgeon: Rogene Houston, MD;  [facility];  Service: Endoscopy;  Laterality: N/A;  110n - moved to 11:15 - Ann to notify pt  ? COLONOSCOPY N/A 07/12/2019  ? rehman: -  Diverticulosis in the sigmoid colon and in the descending colon., external and internal hemorrhoids, no specimens collected  ? ESOPHAGOGASTRODUODENOSCOPY    ? ESOPHAGOGASTRODUODENOSCOPY N/A 01/29/2016  ? Procedure: ESOPHAGOGASTRODUODENOSCOPY (EGD);  Surgeon: Rogene Houston, MD;  [facility];  Service: Endoscopy;  Laterality: N/A;  ? ESOPHAGOGASTRODUODENOSCOPY N/A 10/14/2017  ? Procedure: ESOPHAGOGASTRODUODENOSCOPY (EGD);  Surgeon: Rogene Houston, MD;  [facility];  Service: Endoscopy;  Laterality: N/A;  730  ? ESOPHAGOGASTRODUODENOSCOPY N/A 07/12/2019  ? Procedure: ESOPHAGOGASTRODUODENOSCOPY (EGD);  Surgeon: Rogene Houston, MD;  [facility];  Service: Endoscopy;  Laterality: N/A;  ? ESOPHAGOGASTRODUODENOSCOPY N/A 10/03/2019  ? Procedure: ESOPHAGOGASTRODUODENOSCOPY (EGD);  Surgeon: Rogene Houston, MD;  [facility];  Service: Endoscopy;  Laterality: N/A;  ? ESOPHAGOGASTRODUODENOSCOPY (EGD) WITH PROPOFOL N/A 10/31/2020  ? Rehman: - LA Grade D esophagitis with no bleeding. 2cm HH, two polyps below GE junction, small amount of resude in stomach,gastric antral vascular ectasia w/o bleeding, treated with APC. scalloped mucosa found in duodenum, consistent with celiac disease  ? EYE SURGERY Right 01/2014  ? cataracts  ? GIVENS CAPSULE STUDY N/A 07/29/2019  ? Procedure: GIVENS CAPSULE STUDY;  Surgeon: Rogene Houston, MD;  [facility];  Service: Endoscopy;  Laterality: N/A;  ? GIVENS CAPSULE STUDY N/A 08/20/2019  ? Procedure: GIVENS CAPSULE STUDY;  Surgeon: Rogene Houston, MD;  [facility];  Service: Endoscopy;  Laterality: N/A;  730am  ? GIVENS CAPSULE STUDY  10/03/2019  ? Procedure: GIVENS CAPSULE STUDY;  Surgeon: Rogene Houston, MD;  [facility];  Service: Endoscopy;;  ? HOT HEMOSTASIS  10/31/2020  ? Procedure: HOT HEMOSTASIS (ARGON PLASMA COAGULATION/BICAP);  Surgeon: Montez Morita, Quillian Quince, MD;  [facility];   Service: Gastroenterology;;  ? INTRAMEDULLARY (IM) NAIL INTERTROCHANTERIC Left 01/07/2019  ? Procedure: INTRAMEDULLARY (IM) NAIL INTERTROCHANTRIC;  Surgeon: Carole Civil, MD;  Location: [facility];  Service: Orthopedics;  Laterality: Left;  ? KYPHOPLASTY N/A 07/08/2014  ? Procedure: Thoracic Eleven Kyphoplasty;  Surgeon: Hosie Spangle, MD;  Location: [place] [facility];  Service: Neurosurgery;  Laterality: N/A;  ? left hip surgery    ? LUMBAR LAMINECTOMY/DECOMPRESSION MICRODISCECTOMY Bilateral 05/20/2013  ? Procedure: LUMBAR LAMINECTOMY/DECOMPRESSION MICRODISCECTOMY 1 LEVEL;  Surgeon: Hosie Spangle, MD;  Location: [place] [facility];  Service: Neurosurgery;  Laterality: Bilateral;  Bilateral Lumbar four-five laminotomy and left lumbar four-five microdiskectomy  ? RIGHT/LEFT HEART CATH AND CORONARY ANGIOGRAPHY N/A 03/31/2019  ? Procedure: RIGHT/LEFT HEART CATH AND CORONARY ANGIOGRAPHY;  Surgeon: Sherren Mocha, MD;  Location: [place] [facility];  Service: Cardiovascular;  Laterality: N/A;  ? SPINE SURGERY    ? Dr Carloyn Manner -  vertebraplasty  ? ? ? reports that she has been smoking cigarettes. She started smoking about 63 years ago. She has a 52.00 pack-year smoking history. She has never used smokeless tobacco. She reports that she does not drink alcohol and does not use drugs. ? ?Allergies  ?Allergen Reactions  ? Codeine Nausea And Vomiting  ?  Tramadol Nausea And Vomiting  ? Asa [Aspirin] Other (See Comments)  ?  Patient is unaware of allergy  ? Azithromycin Other (See Comments)  ?  Patient is unaware of allergy  ? Celebrex [Celecoxib] Other (See Comments)  ?  Irritated stomach ?  ? Cymbalta [Duloxetine Hcl] Other (See Comments)  ?  Felt groggy  ? Prednisone Rash  ?  She has seen the podiatrist and he had injected cortisone in her feet and she had also taken a peel at home. She had a severe reaction to her face with a rash and had to take Benadryl to resolve the symptom. She actually did get more cortisone shots, but no  additional reactions to the shots.  ? Vioxx [Rofecoxib] Other (See Comments)  ?  Unknown  ? Zelnorm [Tegaserod] Other (See Comments)  ?  Patient is unaware of allergy  ? Zocor [Simvastatin] Other (See Comments)  ?  P

## 2022-03-01 NOTE — Assessment & Plan Note (Signed)
--  resume diet control management  ?

## 2022-03-01 NOTE — Assessment & Plan Note (Signed)
--  Pt reports significant history of hemorrhage in past ?--DC IV heparin infusion due to high risk for recurrent GI hemorrhage ?

## 2022-03-01 NOTE — Assessment & Plan Note (Signed)
--  oral replacement ordered, recheck in AM ?--replete magnesium  ?

## 2022-03-01 NOTE — Assessment & Plan Note (Addendum)
--  continue IV cardizem infusion ( had to be restarted )  ?--resume home rate control medication and hopefully can get weaned off IV cardizem in next 24 hours.  Monitor in stepdown ICU.  ?

## 2022-03-01 NOTE — Progress Notes (Signed)
ANTICOAGULATION CONSULT NOTE - Initial Consult ? ?Pharmacy Consult for heparin ?Indication: atrial fibrillation ? ?Allergies  ?Allergen Reactions  ? Codeine Nausea And Vomiting  ? Tramadol Nausea And Vomiting  ? Asa [Aspirin] Other (See Comments)  ?  Patient is unaware of allergy  ? Azithromycin Other (See Comments)  ?  Patient is unaware of allergy  ? Celebrex [Celecoxib] Other (See Comments)  ?  Irritated stomach ?  ? Cymbalta [Duloxetine Hcl] Other (See Comments)  ?  Felt groggy  ? Prednisone Rash  ?  She has seen the podiatrist and he had injected cortisone in her feet and she had also taken a peel at home. She had a severe reaction to her face with a rash and had to take Benadryl to resolve the symptom. She actually did get more cortisone shots, but no additional reactions to the shots.  ? Vioxx [Rofecoxib] Other (See Comments)  ?  Unknown  ? Zelnorm [Tegaserod] Other (See Comments)  ?  Patient is unaware of allergy  ? Zocor [Simvastatin] Other (See Comments)  ?  Patient is unaware of allergy  ? ? ?Patient Measurements: ?Height: 5' 7"  (170.2 cm) ?Weight: 57 kg (125 lb 10.6 oz) ?IBW/kg (Calculated) : 61.6 ?Heparin Dosing Weight: 57 kg ? ?Vital Signs: ?BP: 150/89 (04/14 0640) ?Pulse Rate: 138 (04/14 0640) ? ?Labs: ?Recent Labs  ?  02/26/22 ?1612  ?HGB 11.3  ?HCT 38.2  ?PLT 199  ?CREATININE 0.84  ? ? ?Estimated Creatinine Clearance: 48.1 mL/min (by C-G formula based on SCr of 0.84 mg/dL). ? ? ?Medical History: ?Past Medical History:  ?Diagnosis Date  ? Anxiety   ? takes Xanax daily  ? Arthritis   ? back  ? Atrial fibrillation with RVR ([place]) 03/25/2019  ? Cataract   ? Chronic back pain   ? Colitis   ? Constipation   ? OTC stool softener prn  ? COPD (chronic obstructive pulmonary disease) ([place])   ? Depression   ? Diverticulosis   ? Educated about COVID-19 virus infection 04/07/2019  ? Elevated troponin 03/25/2019  ? GERD (gastroesophageal reflux disease)   ? takes Ranidine daily  ? H/O kyphoplasty 01/14/2019  ?  Hematochezia 02/26/2015  ? Hemorrhoids   ? History of bronchitis   ? History of migraine   ? many yrs ago  ? History of shingles   ? Hyperlipidemia   ? Hypokalemia 01/10/2019  ? Insomnia   ? Migraines   ? Nontraumatic compression fracture of T11 vertebra ([facility]) 07/08/2014  ? Osteoporosis   ? PONV (postoperative nausea and vomiting)   ? ? ? ? ?Assessment: ?81 year old female presented with difficulty breathing. Patient in atrial fibrillation on arrival. She has history of the same; no anticoagulation noted PTA. Pharmacy consulted for heparin management. ? ?CBC, aPTT and INR pending. Looks like Hg ranges 8-11 previously. ? ?Goal of Therapy:  ?Heparin level 0.3-0.7 units/ml ?Monitor platelets by anticoagulation protocol: Yes ?  ?Plan:  ?-Heparin 2900 unit bolus ?-Start heparin infusion at 800 units/hr ?-Check heparin level 8 hrs after start of infusion ?-Monitor CBC daily while on heparin infusion ? ?Tawnya Crook, PharmD, BCPS ?Clinical Pharmacist ?02/20/2022 7:02 AM ? ? ? ?

## 2022-03-01 NOTE — Hospital Course (Addendum)
81 year old female with chronic hypoxic respiratory failure recently started on continuous home oxygen, COPD, pulmonary fibrosis, heart failure, paroxysmal atrial fibrillation (not anticoagulated due to history of GI bleeding) reports that she has been Unvaccinated for COVID-19 because she normally does not leave the house very much.  She was recently seen by her primary care provider during an outpatient visit reports 1 week of worsening shortness of breath.  She denies fever and chills.  She has had cough and chest congestion.  She reports that her symptoms have been worse in the past 24 hours and she reports that she has had heart palpitations.  She denies chest pain symptoms.  She reports she has generalized malaise weakness fatigue and generally feels unwell. ? ?She was evaluated in the emergency room and found to have a multifocal pneumonia.  Her BNP was elevated at 2500 but stable from recent hospitalization.  She was noted to be in atrial fibrillation with RVR and started on an IV Cardizem infusion and IV heparin infusion.  Patient tested positive for SARS 2 coronavirus.  She was started on empiric antibiotic therapy and hospitalization was requested. ?

## 2022-03-01 NOTE — Assessment & Plan Note (Signed)
--   Patient strongly advised to stop all tobacco use and will order nicotine patch in the hospital ?

## 2022-03-01 NOTE — Assessment & Plan Note (Addendum)
--  exacerbated by Covid Pneumonia and covid infection ?--had been rate controlled with bisoprolol prior to this acute illness ?--No anticoagulation due to history of recurrent GI hemorrhage ?--IV heparin started in the ED has been discontinued.  ?

## 2022-03-01 NOTE — Assessment & Plan Note (Signed)
--  careful with IV fluid ?--resume daily lasix therapy ?

## 2022-03-01 NOTE — Assessment & Plan Note (Signed)
--   Patient high risk for adverse outcomes from COVID infection, given high risk status treating COVID aggressively with inpatient hospitalization ?

## 2022-03-01 NOTE — Assessment & Plan Note (Signed)
--   Resume daily iron sulfate supplement ?

## 2022-03-01 NOTE — ED Triage Notes (Signed)
Pt to ED from home via [facility]. Called out for breathing difficulties. Hx of copd and chf. Current everyday smoker. States she started feeling nauseous just pta. 86% on room air. Currently in afib on the monitor with a rate of 130. Hx of same. ?

## 2022-03-01 NOTE — Assessment & Plan Note (Signed)
--  IV replacement ordered, recheck in AM ?

## 2022-03-01 NOTE — Assessment & Plan Note (Signed)
--  protonix ordered for GI protection  ?

## 2022-03-01 NOTE — Assessment & Plan Note (Addendum)
--  gentle IV fluid hydration ordered.  ?--lasix holiday today 4/15 ?

## 2022-03-01 NOTE — ED Notes (Signed)
Heparin discontinued per verbal order from Dr. Wynetta Emery.  ?

## 2022-03-01 NOTE — Progress Notes (Signed)
Pharmacy Antibiotic Note ? ?Misty [name] a 81 y.o. female admitted on 02/25/2022 with pneumonia.  Pharmacy has been consulted for vancomycin dosing. ? ?Plan: ?Vancomycin 553m IV every 12 hours.  Goal trough 15-20 mcg/mL. ? ?Medical History: ?Past Medical History:  ?Diagnosis Date  ? Anxiety   ? takes Xanax daily  ? Arthritis   ? back  ? Atrial fibrillation with RVR ([place] 03/25/2019  ? Cataract   ? Chronic back pain   ? Colitis   ? Constipation   ? OTC stool softener prn  ? COPD (chronic obstructive pulmonary disease) ([place]   ? Depression   ? Diverticulosis   ? Educated about COVID-19 virus infection 04/07/2019  ? Elevated troponin 03/25/2019  ? GERD (gastroesophageal reflux disease)   ? takes Ranidine daily  ? H/O kyphoplasty 01/14/2019  ? Hematochezia 02/26/2015  ? Hemorrhoids   ? History of bronchitis   ? History of migraine   ? many yrs ago  ? History of shingles   ? Hyperlipidemia   ? Hypokalemia 01/10/2019  ? Insomnia   ? Migraines   ? Nontraumatic compression fracture of T11 vertebra (HCC) 07/08/2014  ? Osteoporosis   ? PONV (postoperative nausea and vomiting)   ? ? ?Allergies:  ?Allergies  ?Allergen Reactions  ? Codeine Nausea And Vomiting  ? Tramadol Nausea And Vomiting  ? Asa [Aspirin] Other (See Comments)  ?  Patient is unaware of allergy  ? Azithromycin Other (See Comments)  ?  Patient is unaware of allergy  ? Celebrex [Celecoxib] Other (See Comments)  ?  Irritated stomach ?  ? Cymbalta [Duloxetine Hcl] Other (See Comments)  ?  Felt groggy  ? Prednisone Rash  ?  She has seen the podiatrist and he had injected cortisone in her feet and she had also taken a peel at home. She had a severe reaction to her face with a rash and had to take Benadryl to resolve the symptom. She actually did get more cortisone shots, but no additional reactions to the shots.  ? Vioxx [Rofecoxib] Other (See Comments)  ?  Unknown  ? Zelnorm [Tegaserod] Other (See Comments)  ?  Patient is unaware of allergy  ? Zocor [Simvastatin] Other  (See Comments)  ?  Patient is unaware of allergy  ? ? ?Filed Weights  ? 03/15/2022 0641  ?Weight: 57 kg (125 lb 10.6 oz)  ? ? ? ?  Latest Ref Rng & Units 02/24/2022  ?  6:49 AM 02/26/2022  ?  4:12 PM 02/08/2022  ?  4:33 AM  ?CBC  ?WBC 4.0 - 10.5 K/uL 9.2   7.4   7.1    ?Hemoglobin 12.0 - 15.0 g/dL 11.2   11.3   8.5    ?Hematocrit 36.0 - 46.0 % 38.4   38.2   31.9    ?Platelets 150 - 400 K/uL 207   199   160    ? ? ? ?Estimated Creatinine Clearance: 43.9 mL/min (by C-G formula based on SCr of 0.92 mg/dL). ? ?Antibiotics Given (last 72 hours)   ? ? Date/Time Action Medication Dose Rate  ? 03/03/2022 0723 New Bag/Given  ? cefTRIAXone (ROCEPHIN) 2 g in sodium chloride 0.9 % 100 mL IVPB 2 g 200 mL/hr  ? ?  ? ? ?Antimicrobials this admission: ? ?vancomycin 02/23/2022  >>  ?Cefepime 03/12/2022  x 1  ?Ceftriaxone 02/24/2022  x 1  ? ?Microbiology results: ?03/06/2022  BCx: sent ?03/06/2022  UCx: sent ?03/15/2022  Resp Panel: sent  ?  03/08/2022  MRSA PCR: sent ? ?Thank you for allowing pharmacy to be a part of this patient?s care. ? ?Thomasenia Sales, PharmD ?Clinical Pharmacist ?  ?

## 2022-03-01 NOTE — Assessment & Plan Note (Signed)
--  continue DNR order while in hospital  ?

## 2022-03-02 DIAGNOSIS — J9611 Chronic respiratory failure with hypoxia: Secondary | ICD-10-CM | POA: Diagnosis not present

## 2022-03-02 DIAGNOSIS — D500 Iron deficiency anemia secondary to blood loss (chronic): Secondary | ICD-10-CM

## 2022-03-02 DIAGNOSIS — I429 Cardiomyopathy, unspecified: Secondary | ICD-10-CM | POA: Diagnosis not present

## 2022-03-02 DIAGNOSIS — I4891 Unspecified atrial fibrillation: Secondary | ICD-10-CM | POA: Diagnosis not present

## 2022-03-02 DIAGNOSIS — F411 Generalized anxiety disorder: Secondary | ICD-10-CM

## 2022-03-02 DIAGNOSIS — Z66 Do not resuscitate: Secondary | ICD-10-CM | POA: Diagnosis not present

## 2022-03-02 LAB — MAGNESIUM: Magnesium: 1.8 mg/dL (ref 1.7–2.4)

## 2022-03-02 LAB — PHOSPHORUS: Phosphorus: 4.7 mg/dL — ABNORMAL HIGH (ref 2.5–4.6)

## 2022-03-02 LAB — COMPREHENSIVE METABOLIC PANEL
ALT: 13 U/L (ref 0–44)
AST: 17 U/L (ref 15–41)
Albumin: 2.4 g/dL — ABNORMAL LOW (ref 3.5–5.0)
Alkaline Phosphatase: 85 U/L (ref 38–126)
Anion gap: 8 (ref 5–15)
BUN: 32 mg/dL — ABNORMAL HIGH (ref 8–23)
CO2: 36 mmol/L — ABNORMAL HIGH (ref 22–32)
Calcium: 8.1 mg/dL — ABNORMAL LOW (ref 8.9–10.3)
Chloride: 99 mmol/L (ref 98–111)
Creatinine, Ser: 1.17 mg/dL — ABNORMAL HIGH (ref 0.44–1.00)
GFR, Estimated: 47 mL/min — ABNORMAL LOW (ref >60)
Glucose, Bld: 136 mg/dL — ABNORMAL HIGH (ref 70–99)
Potassium: 4.1 mmol/L (ref 3.5–5.1)
Sodium: 143 mmol/L (ref 135–145)
Total Bilirubin: 0.6 mg/dL (ref 0.3–1.2)
Total Protein: 5.6 g/dL — ABNORMAL LOW (ref 6.5–8.1)

## 2022-03-02 LAB — CBC WITH DIFFERENTIAL/PLATELET
Abs Immature Granulocytes: 0.04 10*3/uL (ref 0.00–0.07)
Basophils Absolute: 0 10*3/uL (ref 0.0–0.1)
Basophils Relative: 0 %
Eosinophils Absolute: 0 10*3/uL (ref 0.0–0.5)
Eosinophils Relative: 0 %
HCT: 36.8 % (ref 36.0–46.0)
Hemoglobin: 10.4 g/dL — ABNORMAL LOW (ref 12.0–15.0)
Immature Granulocytes: 1 %
Lymphocytes Relative: 5 %
Lymphs Abs: 0.2 10*3/uL — ABNORMAL LOW (ref 0.7–4.0)
MCH: 27 pg (ref 26.0–34.0)
MCHC: 28.3 g/dL — ABNORMAL LOW (ref 30.0–36.0)
MCV: 95.6 fL (ref 80.0–100.0)
Monocytes Absolute: 0.2 10*3/uL (ref 0.1–1.0)
Monocytes Relative: 4 %
Neutro Abs: 4.6 10*3/uL (ref 1.7–7.7)
Neutrophils Relative %: 90 %
Platelets: 179 10*3/uL (ref 150–400)
RBC: 3.85 MIL/uL — ABNORMAL LOW (ref 3.87–5.11)
RDW: 25.9 % — ABNORMAL HIGH (ref 11.5–15.5)
WBC: 5.1 10*3/uL (ref 4.0–10.5)
nRBC: 0 % (ref 0.0–0.2)

## 2022-03-02 LAB — D-DIMER, QUANTITATIVE: D-Dimer, Quant: 0.71 ug/mL-FEU — ABNORMAL HIGH (ref 0.00–0.50)

## 2022-03-02 LAB — C-REACTIVE PROTEIN: CRP: 20.3 mg/dL — ABNORMAL HIGH (ref <1.0)

## 2022-03-02 LAB — FERRITIN: Ferritin: 157 ng/mL (ref 11–307)

## 2022-03-02 LAB — GLUCOSE, CAPILLARY: Glucose-Capillary: 106 mg/dL — ABNORMAL HIGH (ref 70–99)

## 2022-03-02 LAB — PROCALCITONIN: Procalcitonin: <0.1 ng/mL

## 2022-03-02 MED ORDER — NOREPINEPHRINE 4 MG/250ML-% IV SOLN
0.0000 ug/min | INTRAVENOUS | Status: DC
  Administered 2022-03-02: 2 ug/min via INTRAVENOUS
  Filled 2022-03-02: qty 250

## 2022-03-02 MED ORDER — CHLORHEXIDINE GLUCONATE CLOTH 2 % EX PADS
6.0000 | MEDICATED_PAD | Freq: Every day | CUTANEOUS | Status: DC
  Administered 2022-03-02: 6 via TOPICAL

## 2022-03-02 MED ORDER — ALPRAZOLAM 0.5 MG PO TABS
0.5000 mg | ORAL_TABLET | Freq: Three times a day (TID) | ORAL | Status: DC | PRN
  Filled 2022-03-02: qty 2

## 2022-03-02 MED ORDER — SODIUM CHLORIDE 0.9 % IV BOLUS
500.0000 mL | Freq: Once | INTRAVENOUS | Status: AC
  Administered 2022-03-02: 500 mL via INTRAVENOUS

## 2022-03-02 MED ORDER — FUROSEMIDE 40 MG PO TABS: 20.0000 mg | ORAL_TABLET | Freq: Every day | ORAL | Status: DC

## 2022-03-02 MED ORDER — ALPRAZOLAM 0.5 MG PO TABS: 0.5000 mg | ORAL_TABLET | Freq: Three times a day (TID) | ORAL | Status: DC | PRN

## 2022-03-02 MED ORDER — SODIUM CHLORIDE 0.9 % IV BOLUS
250.0000 mL | Freq: Once | INTRAVENOUS | Status: AC
  Administered 2022-03-02: 250 mL via INTRAVENOUS

## 2022-03-06 LAB — CULTURE, BLOOD (ROUTINE X 2)
Culture: NO GROWTH
Culture: NO GROWTH
Special Requests: ADEQUATE
Special Requests: ADEQUATE

## 2022-03-12 ENCOUNTER — Ambulatory Visit (INDEPENDENT_AMBULATORY_CARE_PROVIDER_SITE_OTHER): Payer: Medicare Other | Admitting: Gastroenterology

## 2022-03-13 ENCOUNTER — Ambulatory Visit: Payer: Medicare Other | Admitting: Cardiology

## 2022-03-18 NOTE — Progress Notes (Signed)
?PROGRESS NOTE ? ? ?Misty [name]  NID:[id number] DOB: May 22, 1941 DOA: 02/24/2022 ?PCP: Claretta Fraise, MD  ? ?Chief Complaint  ?Patient presents with  ? Shortness of Breath  ? ?Level of care: Stepdown ? ?Brief Admission History:  ?81 year old female with chronic hypoxic respiratory failure recently started on continuous home oxygen, COPD, pulmonary fibrosis, heart failure, paroxysmal atrial fibrillation (not anticoagulated due to history of GI bleeding) reports that she has been on vaccinated for COVID-19 because she normally does not leave the house very much.  She was recently seen by her primary care provider during an outpatient visit reports 1 week of worsening shortness of breath.  She denies fever and chills.  She has had cough and chest congestion.  She reports that her symptoms have been worse in the past 24 hours and she reports that she has had heart palpitations.  She denies chest pain symptoms.  She reports she has generalized malaise weakness fatigue and generally feels unwell. ? ?She was evaluated in the emergency room and found to have a multifocal pneumonia.  Her BNP was elevated at 2500 but stable from recent hospitalization.  She was noted to be in atrial fibrillation with RVR and started on an IV Cardizem infusion and IV heparin infusion.  Patient tested positive for SARS 2 coronavirus.  She was started on empiric antibiotic therapy and hospitalization was requested. ?  ?Assessment and Plan: ?Pneumonia due to COVID-19 virus (unvaccinated) ?-- Patient considered high risk due to unvaccinated status, pulmonary fibrosis chronic respiratory failure and advanced age.  IV remdesivir ordered. ?-- Continue supplemental oxygen as ordered ?-- Steroids ordered ?-- Bronchodilators ordered ?-- Monitor and trend inflammatory markers ?-- Check procalcitonin.  Continue antibiotics until we have results of procalcitonin testing ?-- Continue supportive care as ordered ? ?Atrial fibrillation with RVR  ([place]) ?--continue IV cardizem infusion ( had to be restarted )  ?--resume home rate control medication and hopefully can get weaned off IV cardizem in next 24 hours.  Monitor in stepdown ICU.  ? ?Hypokalemia ?--oral replacement ordered, recheck in AM ?--replete magnesium  ? ?Hypomagnesemia ?--IV replacement ordered, recheck in AM ? ?Chronic respiratory failure with hypoxia (HCC) ?--pt reports being on 2L/min oxygen but recently increased to 3-4L/min due to dyspnea symptoms.  Continue supplement oxygenation.  ? ?DNR (do not resuscitate) ?--continue DNR order while in hospital  ? ?Cardiomyopathy ([place]) ?--careful with IV fluid ?--resume daily lasix therapy ? ?Gastrointestinal hemorrhage ?--Pt reports significant history of hemorrhage in past ?--DC IV heparin infusion due to high risk for recurrent GI hemorrhage ? ?Paroxysmal atrial fibrillation (HCC) ?--exacerbated by Covid Pneumonia and covid infection ?--had been rate controlled with bisoprolol prior to this acute illness ?--No anticoagulation due to history of recurrent GI hemorrhage ?--IV heparin started in the ED has been discontinued.  ? ?GAD (generalized anxiety disorder) ?--resume home treatments ? ?Iron deficiency anemia ?-- resume home oral iron supplement  ? ?Anxiety and depression ?--resume home treatments.  ? ?Pulmonary fibrosis ([place]) ?-- Patient high risk for adverse outcomes from COVID infection, given high risk status treating COVID aggressively with inpatient hospitalization ? ?Tobacco use disorder ?-- Patient strongly advised to stop all tobacco use and will order nicotine patch in the hospital ? ?Generalized weakness ?--secondary to acute covid infection  ?--planning for PT eval when medically ready.  ? ?Symptomatic anemia ?-- Resume daily iron sulfate supplement ? ?Orthostatic hypotension ?--gentle IV fluid hydration ordered.  ?--lasix holiday today 4/15 ? ?Mixed hyperlipidemia ?--resume diet control management  ? ?  GERD (gastroesophageal reflux  disease) ?--protonix ordered for GI protection  ? ?DVT prophylaxis: enoxaparin ?Code Status: DNR  ?Family Communication:  ?Disposition: Status is: Inpatient ?Remains inpatient appropriate because: IV fluids, IV remdesivir, high risk for complication due to co-morbidities ?  ?Consultants:  ? ?Procedures:  ? ?Antimicrobials:  ?Remdesivir 4/14>>  ?Subjective: ?Pt reports less weakness than yesterday.  No palpitations, no chest pain.   ?Objective: ?Vitals:  ? Mar 11, 2022 0800 03-11-2022 0829 11-Mar-2022 0900 2022-03-11 1000  ?BP: (!) 112/54 (!) 99/59 117/66 (!) 106/58  ?Pulse: (!) 36 (!) 146 (!) 117 (!) 34  ?Resp: 19 18 19 20   ?Temp:      ?TempSrc:      ?SpO2: 100% 100% 100% 100%  ?Weight:      ?Height:      ? ? ?Intake/Output Summary (Last 24 hours) at Mar 11, 2022 1127 ?Last data filed at 03-11-2022 1000 ?Gross per 24 hour  ?Intake 1821.24 ml  ?Output 900 ml  ?Net 921.24 ml  ? ?Filed Weights  ? 02/18/2022 0641  ?Weight: 57 kg  ? ?Examination: ? ?General exam: Appears calm and comfortable  ?Respiratory system: Clear to auscultation. Respiratory effort normal. ?Cardiovascular system: normal S1 & S2 heard. No JVD, murmurs, rubs, gallops or clicks. No pedal edema. ?Gastrointestinal system: Abdomen is nondistended, soft and nontender. No organomegaly or masses felt. Normal bowel sounds heard. ?Central nervous system: Alert and oriented. No focal neurological deficits. ?Extremities: Symmetric 5 x 5 power. ?Skin: No rashes, lesions or ulcers. ?Psychiatry: Judgement and insight appear normal. Mood & affect appropriate.  ? ?Data Reviewed: I have personally reviewed following labs and imaging studies ? ?CBC: ?Recent Labs  ?Lab 02/26/22 ?1612 03/10/2022 ?4562 2022/03/11 ?5638  ?WBC 7.4 9.2 5.1  ?NEUTROABS 6.5 8.5* 4.6  ?HGB 11.3 11.2* 10.4*  ?HCT 38.2 38.4 36.8  ?MCV 89 93.7 95.6  ?PLT 199 207 179  ? ? ?Basic Metabolic Panel: ?Recent Labs  ?Lab 02/26/22 ?1612 03/06/2022 ?9373 Mar 11, 2022 ?4287  ?NA 149* 144 143  ?K 2.8* 3.0* 4.1  ?CL 100 98 99  ?CO2 32*  35* 36*  ?GLUCOSE 111* 131* 136*  ?BUN 12 22 32*  ?CREATININE 0.84 0.92 1.17*  ?CALCIUM 8.1* 7.9* 8.1*  ?MG  --  1.4* 1.8  ?PHOS  --   --  4.7*  ? ? ?CBG: ?No results for input(s): GLUCAP in the last 168 hours. ? ?Recent Results (from the past 240 hour(s))  ?Resp Panel by RT-PCR (Flu A&B, Covid) Nasopharyngeal Swab     Status: Abnormal  ? Collection Time: 03/11/2022  6:51 AM  ? Specimen: Nasopharyngeal Swab; Nasopharyngeal(NP) swabs in vial transport medium  ?Result Value Ref Range Status  ? SARS Coronavirus 2 by RT PCR POSITIVE (A) NEGATIVE Final  ?  Comment: (NOTE) ?SARS-CoV-2 target nucleic acids are DETECTED. ? ?The SARS-CoV-2 RNA is generally detectable in upper respiratory ?specimens during the acute phase of infection. Positive results are ?indicative of the presence of the identified virus, but do not rule ?out bacterial infection or co-infection with other pathogens not ?detected by the test. Clinical correlation with patient history and ?other diagnostic information is necessary to determine patient ?infection status. The expected result is Negative. ? ?Fact Sheet for Patients: ?EntrepreneurPulse.com.au ? ?Fact Sheet for Healthcare Providers: ?IncredibleEmployment.be ? ?This test is not yet approved or cleared by the [place] FDA and  ?has been authorized for detection and/or diagnosis of SARS-CoV-2 by ?FDA under an Emergency Use Authorization (EUA).  This EUA will ?remain in effect (meaning  this test can be used) for the duration of  ?the COVID-19 declaration under Section 564(b)(1) of the [place] ct, 21 ?U.S.C. section 360bbb-3(b)(1), unless the authorization is ?terminated or revoked sooner. ? ?  ? Influenza A by PCR NEGATIVE NEGATIVE Final  ? Influenza B by PCR NEGATIVE NEGATIVE Final  ?  Comment: (NOTE) ?The Xpert Xpress SARS-CoV-2/FLU/RSV plus assay is intended as an aid ?in the diagnosis of influenza from Nasopharyngeal swab specimens and ?should not be used as a sole  basis for treatment. Nasal washings and ?aspirates are unacceptable for Xpert Xpress SARS-CoV-2/FLU/RSV ?testing. ? ?Fact Sheet for Patients: ?EntrepreneurPulse.com.au ? ?Fact Sheet for Healthcare

## 2022-03-18 NOTE — Progress Notes (Signed)
Ms Zundel had called for [place] to come in and reposition her. Blood pressure had been low this morning and we had been trying to titrate nor-epi to keep pressure up. When we went in this time patient was still anxious and I was going to give her Xanax when we straightened her in the bed. Patient was able to roll herself so we could get chuck under her straight and when she rolled back she started to become very slow to speak and had difficulty breathing. We retrieved non-rebreather mask and called Dr. Wynetta Emery. Blood sugar level was taken to make sure levels were not low. Started breathing more slowly and Dr. Wynetta Emery arrived and felt there was nothing else to do because heart rate had started declining also. Patient died at 47, Dr. Wynetta Emery notified and family had been called before death occurred and now on way to hospital.  ?

## 2022-03-18 NOTE — Progress Notes (Signed)
Called [employer], spoke to [employer], patient is not a candidate for donation ?

## 2022-03-18 NOTE — Progress Notes (Signed)
11-Mar-2022 ? ?3:50 PM ? ?Called to see patient as she had a rapid decompensation where she turned pale and developed acute respiratory distress and became severely hypotensive despite trial of pressor therapy and fluid bolus.  She was given extra supplemental oxygen and 100% nonrebreather with no significant improvement.   Pt is DNR.  I think she is decompensating from overwheming covid infection given her unvaccinated status and severe lung disease.  I called family and asked for them to come in to see patient as it appears that she is approaching end of life and they are on the way now.   Pt expired and was pronounced 1550.  I met with family when they arrived.  Murvin Natal, MD  ?

## 2022-03-18 NOTE — Death Summary Note (Signed)
?DEATH SUMMARY  ? ?Patient Details  ?Name: Misty [name] ?MRN: [id number] ?DOB: [birth year] ? ?Admission/Discharge Information  ? ?Admit Date:  03/29/2022  ?Date of Death: Date of Death: 2022-03-30  ?Time of Death: Time of Death: 5956  ?Length of Stay: 1  ?Referring Physician: Claretta Fraise, MD  ? ?81 year old female with chronic hypoxic respiratory failure recently started on continuous home oxygen, COPD, pulmonary fibrosis, heart failure, paroxysmal atrial fibrillation (not anticoagulated due to history of GI bleeding) reports that she has been Unvaccinated for COVID-19 because she normally does not leave the house very much.  She was recently seen by her primary care provider during an outpatient visit reports 1 week of worsening shortness of breath.  She denies fever and chills.  She has had cough and chest congestion.  She reports that her symptoms have been worse in the past 24 hours and she reports that she has had heart palpitations.  She denies chest pain symptoms.  She reports she has generalized malaise weakness fatigue and generally feels unwell. ? ?She was evaluated in the emergency room and found to have a multifocal pneumonia.  Her BNP was elevated at 2500 but stable from recent hospitalization.  She was noted to be in atrial fibrillation with RVR and started on an IV Cardizem infusion and IV heparin infusion.  Patient tested positive for SARS 2 coronavirus.  She was started on empiric antibiotic therapy and hospitalization was requested. ? ?Diagnoses  ?Preliminary cause of death: Covid Pneumonia  ?Secondary Diagnoses (including complications and co-morbidities):  ?Active Problems: ?  Hypokalemia ?  Atrial fibrillation with RVR ([place]) ?  Pneumonia due to COVID-19 virus (unvaccinated) ?  GERD (gastroesophageal reflux disease) ?  Mixed hyperlipidemia ?  Orthostatic hypotension ?  Generalized weakness ?  Tobacco use disorder ?  Pulmonary fibrosis ([place]) ?  Anxiety and depression ?  Iron deficiency anemia ?   GAD (generalized anxiety disorder) ?  Paroxysmal atrial fibrillation (HCC) ?  Gastrointestinal hemorrhage ?  Cardiomyopathy ([place]) ?  DNR (do not resuscitate) ?  Chronic respiratory failure with hypoxia (HCC) ?  Hypomagnesemia ? ?Brief Hospital Course (including significant findings, care, treatment, and services provided and events leading to death)  ?Misty [name] is a 81 y.o. year old female who is DNR and was admitted with covid pneumonia with acute on chronic respiratory failure with hypoxia and increasing oxygen requirement in addition to atrial fibrillation with RVR.  Pt was briefly placed on IV diltiazem infusion for heart rate control and she was on steroids, remdesivir and antibiotics and gentle IV fluid hydration.  Unfortunately despite these therapies she developed increasingly progressive respiratory distress and severe hypotension on Mar 31, 2023.  We did brief trial of fluid boluses and IV pressor therapy but despite this her BP did not rebound and she continued to rapidly went into respiratory failure and expired and pronounced dead at 1550.  RN and MD and NT was at bedside.   ? ?Pertinent Labs and Studies  ?Significant Diagnostic Studies ?DG Chest 2 View ? ?Result Date: 02/27/2022 ?CLINICAL DATA:  81 year old female with cough and dyspnea EXAM: CHEST - 2 VIEW COMPARISON:  02/07/2022, 10/30/2020, prior abdominal CT 07/30/2019 FINDINGS: Cardiac diameter enlarged though improved from the comparison plain film. Interlobular septal thickening throughout the lungs. Meniscus within the sulcus on the lateral view. No pneumothorax.  No large confluent airspace disease. Stigmata of emphysema, with increased retrosternal airspace, flattened hemidiaphragms, increased AP diameter, and hyperinflation on the AP view. Redemonstration of embolized liquid cement  within the pulmonary arteries, seen on prior plain film and CT. Evidence of prior multilevel vertebral augmentation. Osteopenia. IMPRESSION: Acute CHF with small  pleural effusions. Electronically Signed   By: Corrie Mckusick D.O.   On: 02/27/2022 09:27  ? ?DG Chest 2 View ? ?Result Date: 02/07/2022 ?CLINICAL DATA:  Chest pain and short of breath.  Leg swelling EXAM: CHEST - 2 VIEW COMPARISON:  10/30/2020 FINDINGS: Cardiac enlargement. Mild vascular congestion. Underlying chronic lung disease based on the prior study. Negative for edema. Small bilateral pleural effusions and mild bibasilar atelectasis. Cement vertebral augmentation at multiple levels in the thoracic and lumbar spine. Limited bony detail due to osteopenia. IMPRESSION: Cardiac enlargement without edema. Mild bibasilar atelectasis and small effusions. Electronically Signed   By: Franchot Gallo M.D.   On: 02/07/2022 16:50  ? ?DG Chest Port 1 View ? ?Result Date: 02/17/2022 ?CLINICAL DATA:  81 year old female with shortness of breath, nausea vomiting. Smoker. COPD. EXAM: PORTABLE CHEST 1 VIEW COMPARISON:  Chest radiographs 02/26/2022 and earlier. FINDINGS: Portable AP upright view at 0659 hours. Large lung volumes. Stable cardiac size and mediastinal contours. Calcified aortic atherosclerosis. Multilevel augmented spinal compression fractures. Patchy and confluent new reticulonodular opacity in both the right upper and lower lungs including along the minor fissure. No superimposed pneumothorax. No definite pleural effusion, blunting of the right costophrenic angle appears to be chronic. IMPRESSION: Multifocal acute right lung bronchopneumonia/pneumonia superimposed on chronic lung disease. No definite pleural effusion. Electronically Signed   By: Genevie Ann M.D.   On: 03/13/2022 07:18  ? ?DG Shoulder Left ? ?Result Date: 02/27/2022 ?CLINICAL DATA:  Pain left shoulder EXAM: LEFT SHOULDER - 2+ VIEW COMPARISON:  None. FINDINGS: No fracture or dislocation is seen. Osteopenia is seen in bony structures. Degenerative changes are noted in the left [facility] joint. There is decreased distance between acromion and humeral head suggesting  possible chronic tear of rotator cuff. There are small calcifications in the soft tissues adjacent to the greater tuberosity of proximal humerus. IMPRESSION: No recent fracture or dislocation is seen. Small calcifications adjacent to proximal humerus may suggest calcific bursitis or tendinosis. Degenerative changes are noted in the left [facility] joint. Electronically Signed   By: Elmer Picker M.D.   On: 02/27/2022 11:49  ? ?ECHOCARDIOGRAM COMPLETE ? ?Result Date: 02/08/2022 ?   ECHOCARDIOGRAM REPORT   Patient Name:   Misty [name] Date of Exam: 02/08/2022 Medical Rec #:  [id number]        Height:       67.0 in Accession #:    [id number]       Weight:       147.9 lb Date of Birth:  September 17, 1941        BSA:          1.779 m? Patient Age:    28 years         BP:           118/71 mmHg Patient Gender: F                HR:           83 bpm. Exam Location:  [facility] Procedure: 2D Echo, Cardiac Doppler and Color Doppler Indications:    CHF  History:        Patient has prior history of Echocardiogram examinations, most                 recent 03/26/2019. CHF and Cardiomyopathy, Arrythmias:Atrial  Fibrillation; Risk Factors:Dyslipidemia and Current Smoker.  Sonographer:    Wenda Low Referring Phys: [place]  1. Left ventricular ejection fraction, by estimation, is 35 to 40%. The left ventricle has moderately decreased function. The left ventricle demonstrates global hypokinesis. There is moderate left ventricular hypertrophy. Left ventricular diastolic parameters are indeterminate.  2. Right ventricular systolic function is normal. The right ventricular size is mildly enlarged. There is severely elevated pulmonary artery systolic pressure. The estimated right ventricular systolic pressure is 97.4 mmHg.  3. Left atrial size was severely dilated.  4. Right atrial size was severely dilated.  5. The mitral valve is abnormal. Moderate mitral valve regurgitation.  6. Tricuspid valve  regurgitation is mild to moderate.  7. The aortic valve is tricuspid. Aortic valve regurgitation is trivial. Aortic valve sclerosis is present, with no evidence of aortic valve stenosis.  8. The inferi

## 2022-03-18 NOTE — Progress Notes (Signed)
Daughter in law Misty [name] asked that ring on left hand "Wedding band" be removed given to her which I did. Ms. Misty [name] also gathered all other belongings and will take them home with her.  ?

## 2022-03-18 DEATH — deceased

## 2022-03-19 ENCOUNTER — Ambulatory Visit: Payer: Medicare Other | Admitting: Family Medicine

## 2022-05-13 ENCOUNTER — Ambulatory Visit: Payer: Medicare Other | Admitting: Family Medicine
